# Patient Record
Sex: Female | Born: 1935 | Race: Black or African American | Hispanic: No | Smoking: Former smoker
Health system: Southern US, Community
[De-identification: ages and names within clinical notes are randomized; demographics above are authoritative.]

## PROBLEM LIST (undated history)

## (undated) DIAGNOSIS — Z9981 Dependence on supplemental oxygen: Secondary | ICD-10-CM

## (undated) DIAGNOSIS — Z5189 Encounter for other specified aftercare: Secondary | ICD-10-CM

## (undated) DIAGNOSIS — K219 Gastro-esophageal reflux disease without esophagitis: Secondary | ICD-10-CM

## (undated) DIAGNOSIS — F419 Anxiety disorder, unspecified: Secondary | ICD-10-CM

## (undated) DIAGNOSIS — G2581 Restless legs syndrome: Secondary | ICD-10-CM

## (undated) DIAGNOSIS — J9611 Chronic respiratory failure with hypoxia: Secondary | ICD-10-CM

## (undated) DIAGNOSIS — I509 Heart failure, unspecified: Secondary | ICD-10-CM

## (undated) DIAGNOSIS — I4891 Unspecified atrial fibrillation: Secondary | ICD-10-CM

## (undated) DIAGNOSIS — J40 Bronchitis, not specified as acute or chronic: Secondary | ICD-10-CM

## (undated) DIAGNOSIS — A419 Sepsis, unspecified organism: Secondary | ICD-10-CM

## (undated) DIAGNOSIS — K922 Gastrointestinal hemorrhage, unspecified: Secondary | ICD-10-CM

## (undated) DIAGNOSIS — D649 Anemia, unspecified: Secondary | ICD-10-CM

## (undated) DIAGNOSIS — I639 Cerebral infarction, unspecified: Secondary | ICD-10-CM

## (undated) DIAGNOSIS — L02416 Cutaneous abscess of left lower limb: Secondary | ICD-10-CM

## (undated) DIAGNOSIS — J181 Lobar pneumonia, unspecified organism: Secondary | ICD-10-CM

## (undated) DIAGNOSIS — M1990 Unspecified osteoarthritis, unspecified site: Secondary | ICD-10-CM

## (undated) DIAGNOSIS — R410 Disorientation, unspecified: Secondary | ICD-10-CM

## (undated) DIAGNOSIS — N179 Acute kidney failure, unspecified: Secondary | ICD-10-CM

## (undated) DIAGNOSIS — J9612 Chronic respiratory failure with hypercapnia: Secondary | ICD-10-CM

## (undated) DIAGNOSIS — I10 Essential (primary) hypertension: Secondary | ICD-10-CM

## (undated) DIAGNOSIS — L03116 Cellulitis of left lower limb: Secondary | ICD-10-CM

## (undated) DIAGNOSIS — R569 Unspecified convulsions: Secondary | ICD-10-CM

## (undated) HISTORY — PX: TUBAL LIGATION: SHX77

## (undated) HISTORY — PX: CATARACT EXTRACTION W/ INTRAOCULAR LENS IMPLANT: SHX1309

---
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
## 1998-05-10 ENCOUNTER — Other Ambulatory Visit: Admission: RE | Admit: 1998-05-10 | Discharge: 1998-05-10 | Payer: Self-pay | Admitting: Family Medicine

## 1998-06-28 ENCOUNTER — Emergency Department (HOSPITAL_COMMUNITY): Admission: EM | Admit: 1998-06-28 | Discharge: 1998-06-28 | Payer: Self-pay

## 1998-09-23 ENCOUNTER — Observation Stay (HOSPITAL_COMMUNITY): Admission: EM | Admit: 1998-09-23 | Discharge: 1998-09-24 | Payer: Self-pay | Admitting: Emergency Medicine

## 1998-09-23 ENCOUNTER — Encounter: Payer: Self-pay | Admitting: Neurology

## 1999-04-17 ENCOUNTER — Ambulatory Visit (HOSPITAL_COMMUNITY): Admission: RE | Admit: 1999-04-17 | Discharge: 1999-04-17 | Payer: Self-pay | Admitting: Family Medicine

## 1999-12-03 ENCOUNTER — Encounter (INDEPENDENT_AMBULATORY_CARE_PROVIDER_SITE_OTHER): Payer: Self-pay | Admitting: Specialist

## 1999-12-03 ENCOUNTER — Ambulatory Visit (HOSPITAL_COMMUNITY): Admission: RE | Admit: 1999-12-03 | Discharge: 1999-12-03 | Payer: Self-pay | Admitting: Gastroenterology

## 1999-12-17 ENCOUNTER — Ambulatory Visit (HOSPITAL_COMMUNITY): Admission: RE | Admit: 1999-12-17 | Discharge: 1999-12-17 | Payer: Self-pay | Admitting: Gastroenterology

## 1999-12-17 ENCOUNTER — Encounter: Payer: Self-pay | Admitting: Gastroenterology

## 1999-12-22 ENCOUNTER — Ambulatory Visit (HOSPITAL_BASED_OUTPATIENT_CLINIC_OR_DEPARTMENT_OTHER): Admission: RE | Admit: 1999-12-22 | Discharge: 1999-12-22 | Payer: Self-pay | Admitting: Pulmonary Disease

## 2002-01-17 ENCOUNTER — Emergency Department (HOSPITAL_COMMUNITY): Admission: EM | Admit: 2002-01-17 | Discharge: 2002-01-17 | Payer: Self-pay | Admitting: Emergency Medicine

## 2002-01-17 ENCOUNTER — Encounter: Payer: Self-pay | Admitting: Emergency Medicine

## 2004-03-29 ENCOUNTER — Emergency Department (HOSPITAL_COMMUNITY): Admission: EM | Admit: 2004-03-29 | Discharge: 2004-03-29 | Payer: Self-pay | Admitting: Emergency Medicine

## 2004-06-02 ENCOUNTER — Emergency Department (HOSPITAL_COMMUNITY): Admission: EM | Admit: 2004-06-02 | Discharge: 2004-06-02 | Payer: Self-pay | Admitting: Family Medicine

## 2005-09-16 ENCOUNTER — Encounter: Admission: RE | Admit: 2005-09-16 | Discharge: 2005-09-16 | Payer: Self-pay | Admitting: Orthopedic Surgery

## 2005-10-06 ENCOUNTER — Encounter: Payer: Self-pay | Admitting: Vascular Surgery

## 2005-10-06 ENCOUNTER — Ambulatory Visit (HOSPITAL_COMMUNITY): Admission: RE | Admit: 2005-10-06 | Discharge: 2005-10-06 | Payer: Self-pay | Admitting: Orthopedic Surgery

## 2006-02-11 ENCOUNTER — Encounter: Admission: RE | Admit: 2006-02-11 | Discharge: 2006-02-11 | Payer: Self-pay | Admitting: Orthopedic Surgery

## 2006-05-21 ENCOUNTER — Ambulatory Visit: Admission: RE | Admit: 2006-05-21 | Discharge: 2006-05-21 | Payer: Self-pay | Admitting: Orthopedic Surgery

## 2006-05-21 ENCOUNTER — Encounter: Payer: Self-pay | Admitting: Vascular Surgery

## 2006-06-21 ENCOUNTER — Emergency Department (HOSPITAL_COMMUNITY): Admission: EM | Admit: 2006-06-21 | Discharge: 2006-06-21 | Payer: Self-pay | Admitting: Emergency Medicine

## 2006-07-14 ENCOUNTER — Ambulatory Visit (HOSPITAL_COMMUNITY): Admission: RE | Admit: 2006-07-14 | Discharge: 2006-07-14 | Payer: Self-pay | Admitting: Internal Medicine

## 2006-08-25 ENCOUNTER — Encounter: Admission: RE | Admit: 2006-08-25 | Discharge: 2006-08-25 | Payer: Self-pay | Admitting: Orthopedic Surgery

## 2007-02-17 ENCOUNTER — Emergency Department (HOSPITAL_COMMUNITY): Admission: EM | Admit: 2007-02-17 | Discharge: 2007-02-17 | Payer: Self-pay | Admitting: Emergency Medicine

## 2007-05-23 ENCOUNTER — Emergency Department (HOSPITAL_COMMUNITY): Admission: EM | Admit: 2007-05-23 | Discharge: 2007-05-23 | Payer: Self-pay | Admitting: Family Medicine

## 2008-09-05 ENCOUNTER — Emergency Department (HOSPITAL_COMMUNITY): Admission: EM | Admit: 2008-09-05 | Discharge: 2008-09-05 | Payer: Self-pay | Admitting: Emergency Medicine

## 2008-09-30 ENCOUNTER — Emergency Department (HOSPITAL_COMMUNITY): Admission: EM | Admit: 2008-09-30 | Discharge: 2008-09-30 | Payer: Self-pay | Admitting: Family Medicine

## 2010-05-25 ENCOUNTER — Encounter: Payer: Self-pay | Admitting: Internal Medicine

## 2010-07-03 ENCOUNTER — Other Ambulatory Visit: Payer: Self-pay | Admitting: Gastroenterology

## 2010-07-03 ENCOUNTER — Encounter (HOSPITAL_COMMUNITY): Payer: Medicare Other | Attending: Gastroenterology

## 2010-07-03 DIAGNOSIS — R634 Abnormal weight loss: Secondary | ICD-10-CM

## 2010-07-03 DIAGNOSIS — D509 Iron deficiency anemia, unspecified: Secondary | ICD-10-CM | POA: Insufficient documentation

## 2010-07-04 ENCOUNTER — Encounter (HOSPITAL_COMMUNITY): Payer: Medicare Other

## 2010-07-05 LAB — CROSSMATCH
ABO/RH(D): A POS
Antibody Screen: NEGATIVE
Unit division: 0

## 2010-07-07 ENCOUNTER — Ambulatory Visit
Admission: RE | Admit: 2010-07-07 | Discharge: 2010-07-07 | Disposition: A | Discharge status: Home / Self Care | Payer: Medicare Other | Source: Ambulatory Visit | Attending: Gastroenterology | Admitting: Gastroenterology

## 2010-07-07 DIAGNOSIS — R634 Abnormal weight loss: Secondary | ICD-10-CM

## 2010-07-07 MED ORDER — IOHEXOL 300 MG/ML  SOLN
125.0000 mL | Freq: Once | INTRAMUSCULAR | Status: AC | PRN
  Administered 2010-07-07: 125 mL via INTRAVENOUS

## 2010-09-18 ENCOUNTER — Emergency Department (HOSPITAL_COMMUNITY): Payer: Medicare Other

## 2010-09-18 ENCOUNTER — Inpatient Hospital Stay (HOSPITAL_COMMUNITY)
Admission: EM | Admit: 2010-09-18 | Discharge: 2010-09-23 | DRG: 378 | Disposition: A | Discharge status: Home / Self Care | Payer: Medicare Other | Attending: Internal Medicine | Admitting: Internal Medicine

## 2010-09-18 DIAGNOSIS — K5730 Diverticulosis of large intestine without perforation or abscess without bleeding: Secondary | ICD-10-CM | POA: Diagnosis present

## 2010-09-18 DIAGNOSIS — Z860100 Personal history of colon polyps, unspecified: Secondary | ICD-10-CM

## 2010-09-18 DIAGNOSIS — K219 Gastro-esophageal reflux disease without esophagitis: Secondary | ICD-10-CM | POA: Diagnosis present

## 2010-09-18 DIAGNOSIS — K922 Gastrointestinal hemorrhage, unspecified: Principal | ICD-10-CM | POA: Diagnosis present

## 2010-09-18 DIAGNOSIS — D509 Iron deficiency anemia, unspecified: Secondary | ICD-10-CM | POA: Diagnosis present

## 2010-09-18 DIAGNOSIS — Z880 Allergy status to penicillin: Secondary | ICD-10-CM

## 2010-09-18 DIAGNOSIS — E119 Type 2 diabetes mellitus without complications: Secondary | ICD-10-CM | POA: Diagnosis present

## 2010-09-18 DIAGNOSIS — G2581 Restless legs syndrome: Secondary | ICD-10-CM | POA: Diagnosis present

## 2010-09-18 DIAGNOSIS — K319 Disease of stomach and duodenum, unspecified: Secondary | ICD-10-CM | POA: Diagnosis present

## 2010-09-18 DIAGNOSIS — R55 Syncope and collapse: Secondary | ICD-10-CM | POA: Diagnosis present

## 2010-09-18 DIAGNOSIS — I519 Heart disease, unspecified: Secondary | ICD-10-CM | POA: Diagnosis present

## 2010-09-18 DIAGNOSIS — Z7902 Long term (current) use of antithrombotics/antiplatelets: Secondary | ICD-10-CM

## 2010-09-18 DIAGNOSIS — I10 Essential (primary) hypertension: Secondary | ICD-10-CM | POA: Diagnosis present

## 2010-09-18 DIAGNOSIS — Z7982 Long term (current) use of aspirin: Secondary | ICD-10-CM

## 2010-09-18 DIAGNOSIS — D62 Acute posthemorrhagic anemia: Secondary | ICD-10-CM | POA: Diagnosis present

## 2010-09-18 DIAGNOSIS — Z79899 Other long term (current) drug therapy: Secondary | ICD-10-CM

## 2010-09-18 DIAGNOSIS — K5900 Constipation, unspecified: Secondary | ICD-10-CM | POA: Diagnosis present

## 2010-09-18 DIAGNOSIS — Z8673 Personal history of transient ischemic attack (TIA), and cerebral infarction without residual deficits: Secondary | ICD-10-CM

## 2010-09-18 DIAGNOSIS — I059 Rheumatic mitral valve disease, unspecified: Secondary | ICD-10-CM | POA: Diagnosis present

## 2010-09-18 LAB — LIPID PANEL
Cholesterol: 121 mg/dL (ref 0–200)
Total CHOL/HDL Ratio: 2.4 RATIO
VLDL: 15 mg/dL (ref 0–40)

## 2010-09-18 LAB — CARDIAC PANEL(CRET KIN+CKTOT+MB+TROPI)
CK, MB: 1.3 ng/mL (ref 0.3–4.0)
Relative Index: INVALID (ref 0.0–2.5)
Troponin I: <0.3 ng/mL (ref <0.30)

## 2010-09-18 LAB — POCT CARDIAC MARKERS
CKMB, poc: <1 ng/mL — ABNORMAL LOW (ref 1.0–8.0)
Myoglobin, poc: 61 ng/mL (ref 12–200)
Troponin i, poc: <0.05 ng/mL (ref 0.00–0.09)

## 2010-09-18 LAB — URINALYSIS, ROUTINE W REFLEX MICROSCOPIC
Bilirubin Urine: NEGATIVE
Nitrite: NEGATIVE
pH: 5.5 (ref 5.0–8.0)

## 2010-09-18 LAB — HEMOGLOBIN AND HEMATOCRIT, BLOOD: HCT: 26 % — ABNORMAL LOW (ref 36.0–46.0)

## 2010-09-18 LAB — COMPREHENSIVE METABOLIC PANEL
ALT: 8 U/L (ref 0–35)
AST: 13 U/L (ref 0–37)
CO2: 26 mEq/L (ref 19–32)
Calcium: 9.2 mg/dL (ref 8.4–10.5)
Creatinine, Ser: 1.1 mg/dL (ref 0.4–1.2)
GFR calc non Af Amer: 48 mL/min — ABNORMAL LOW (ref >60)
Potassium: 4.3 mEq/L (ref 3.5–5.1)
Total Bilirubin: 0.4 mg/dL (ref 0.3–1.2)

## 2010-09-18 LAB — DIFFERENTIAL
Basophils Absolute: 0 10*3/uL (ref 0.0–0.1)
Basophils Relative: 0 % (ref 0–1)
Eosinophils Relative: 1 % (ref 0–5)
Lymphs Abs: 1 10*3/uL (ref 0.7–4.0)
Monocytes Absolute: 0.3 10*3/uL (ref 0.1–1.0)
Monocytes Relative: 9 % (ref 3–12)
Neutro Abs: 2 10*3/uL (ref 1.7–7.7)

## 2010-09-18 LAB — PRO B NATRIURETIC PEPTIDE: Pro B Natriuretic peptide (BNP): 216.5 pg/mL (ref 0–450)

## 2010-09-18 LAB — OCCULT BLOOD, POC DEVICE: Fecal Occult Bld: POSITIVE

## 2010-09-18 LAB — CBC
Hemoglobin: 8.5 g/dL — ABNORMAL LOW (ref 12.0–15.0)
MCH: 24.4 pg — ABNORMAL LOW (ref 26.0–34.0)
MCHC: 30.4 g/dL (ref 30.0–36.0)
Platelets: 179 10*3/uL (ref 150–400)
WBC: 3.3 10*3/uL — ABNORMAL LOW (ref 4.0–10.5)

## 2010-09-18 MED ORDER — IOHEXOL 300 MG/ML  SOLN
100.0000 mL | Freq: Once | INTRAMUSCULAR | Status: AC | PRN
  Administered 2010-09-18: 100 mL via INTRAVENOUS

## 2010-09-19 DIAGNOSIS — R55 Syncope and collapse: Secondary | ICD-10-CM

## 2010-09-19 DIAGNOSIS — I059 Rheumatic mitral valve disease, unspecified: Secondary | ICD-10-CM

## 2010-09-19 LAB — HEMOGLOBIN A1C
Hgb A1c MFr Bld: 6.5 % — ABNORMAL HIGH (ref <5.7)
Mean Plasma Glucose: 140 mg/dL — ABNORMAL HIGH (ref <117)

## 2010-09-19 LAB — BASIC METABOLIC PANEL
BUN: 17 mg/dL (ref 6–23)
Creatinine, Ser: 0.95 mg/dL (ref 0.4–1.2)
GFR calc non Af Amer: 56 mL/min — ABNORMAL LOW (ref >60)

## 2010-09-19 LAB — GLUCOSE, CAPILLARY
Glucose-Capillary: 155 mg/dL — ABNORMAL HIGH (ref 70–99)
Glucose-Capillary: 142 mg/dL — ABNORMAL HIGH (ref 70–99)
Glucose-Capillary: 130 mg/dL — ABNORMAL HIGH (ref 70–99)

## 2010-09-19 LAB — IRON AND TIBC
Iron: 24 ug/dL — ABNORMAL LOW (ref 42–135)
TIBC: 344 ug/dL (ref 250–470)
UIBC: 320 ug/dL

## 2010-09-19 LAB — HEMOGLOBIN AND HEMATOCRIT, BLOOD: HCT: 34.5 % — ABNORMAL LOW (ref 36.0–46.0)

## 2010-09-19 LAB — CBC
Hemoglobin: 7.8 g/dL — ABNORMAL LOW (ref 12.0–15.0)
MCHC: 30.6 g/dL (ref 30.0–36.0)
RDW: 22.9 % — ABNORMAL HIGH (ref 11.5–15.5)

## 2010-09-19 LAB — URINE CULTURE
Colony Count: NO GROWTH
Culture  Setup Time: 201205180058

## 2010-09-19 LAB — CARDIAC PANEL(CRET KIN+CKTOT+MB+TROPI)
Troponin I: <0.3 ng/mL (ref <0.30)
Troponin I: <0.3 ng/mL (ref <0.30)

## 2010-09-19 LAB — PREPARE RBC (CROSSMATCH)

## 2010-09-19 NOTE — H&P (Signed)
NAMEEMMALI, KAROW              ACCOUNT NO.:  [id number]  MEDICAL RECORD NO.:  [id number]           PATIENT TYPE:  E  LOCATION:  WLED                         FACILITY:  [facility]  PHYSICIAN:  Thad Ranger, MD       DATE OF BIRTH:  Oct 11, 1926  DATE OF ADMISSION:  09/18/2010 DATE OF DISCHARGE:                             HISTORY & PHYSICAL   PRIMARY CARE PHYSICIAN:  Fleet Contras, MD  GASTROENTEROLOGIST:  Charna Elizabeth, MD  CHIEF COMPLAINT:  Syncopal episode.  HISTORY OF PRESENT ILLNESS:  Ms. Rankin is an 77 year old female with a history of anemia, hypertension, diabetes, history of CVA, GERD, who presented to the [facility] emergency room after a syncopal episode. History was provided by the patient herself and the family members present in the room.  The patient was at the [employer] today and she was sitting in the chair engaged in the activities of the [employer] singing devotional sounds.  Per the patient, they had no strenuous activity at the [employer].  Around 10:30, the patient felt dyspneic, dizzy, lightheaded and had a syncopal episode while sitting in the chair.  The patient states that she did not hit her head on the floor. She was still sitting on the chair when she woke up.  She endorsed having nausea, however, no vomiting.  No chest pain or any palpitations. She felt that she was breathing somewhat hard, otherwise no chest pain or palpitations.  She also reported having dark stools today.  She had one episode of dark stools in the morning at 7:30 a.m. and another when she was being transported by the ambulance.  She states that her appetite has been okay.  She is also on aspirin and Plavix for the history of CVA.  Of note, the patient also reported that she had a colonoscopy done in February 2012 by Dr. Charna Elizabeth and it had shown polyps and questionable something and the biopsy was negative.  FOBT in the emergency room was also positive for occult blood.   She denied any fever or chills, or any loss of appetite, or abdominal pain, or any diarrhea or constipation.  REVIEW OF SYSTEMS:  Pertinent positives are dictated above, otherwise negative.  PAST MEDICAL HISTORY: 1. Hypertension. 2. History of arthritis. 3. Diabetes mellitus type 2, not on insulin. 4. GERD. 5. Obesity. 6. Restless legs syndrome. 7. History of CVA, on aspirin and Plavix with slight left-sided     residual deficits.  SOCIAL HISTORY:  The patient denies any smoking, alcohol or any drug use.  She currently lives at home with her daughter.  ALLERGIES:  PENICILLIN and ERYTHROMYCIN.  MEDICATIONS PRIOR TO ADMISSION: 1. Coreg CR 20 mg p.o. daily. 2. Colace 100 mg p.o. daily. 3. Aspirin 81 mg daily. 4. Calcium and vitamin D 1 tablet p.o. b.i.d. 5. Ferrous sulfate 325 mg p.o. t.i.d. 6. Plavix 75 mg p.o. daily. 7. Nexium 40 mg p.o. daily. 8. Micardis/hydrochlorothiazide 80/12.5 mg p.o. daily. 9. Metformin XR 750 mg p.o. b.i.d. 10.Lyrica 50 mg daily. 11.Caduet 10/10 mg p.o. q.a.m. 12.Actos 45 mg p.o. daily at bedtime. 13.Requip 2 mg  p.o. 4 times daily.  PHYSICAL EXAM:  VITAL SIGNS:  Blood pressure 156/71, pulse rate 88, respirations 20, temperature 97.4. GENERAL:  The patient is alert, awake and oriented, very pleasant and cooperative, not in acute distress. HEENT:  Anicteric sclerae conjunctivae.  Pupils react to light and accommodation.  EOMI. NECK:  Supple.  No lymphopathy, no JVD. CVS:  S1, S2 clear.  Regular rate and rhythm. CHEST:  Clear to auscultation bilaterally. ABDOMEN:  Soft, nontender, nondistended.  Normal bowel sounds. EXTREMITIES:  No cyanosis, clubbing or edema noted. NEUROLOGIC:  No new focal neurological deficits noted.  LAB DIAGNOSTIC DATA:  CBC showed white count of 3.3, hemoglobin 8.5, hematocrit 28.0, platelets 179, BNP 216.  D-dimer positive at 1.15. CMP:  Sodium 136, potassium 4.3, BUN 24, creatinine 1.1, calcium 9.2. UA negative for  any UTI.  Cardiac markers first set negative.  Fecal occult blood positive.  RADIOLOGICAL DATA:  Chest x-ray two-view:  Stable cardiomegaly, no active lung disease.  IMPRESSION AND PLAN:  Ms. Russum is an 77 year old female presenting with a syncopal episode. 1. Syncope:  The patient will be admitted to the Medicine service and     placed on telemonitored floor.  Possibly a vasovagal syncopal     episode or exacerbated due to anemia and GI bleed.  Rule out any     cardiac cause of syncope.  Will obtain 2-D echocardiogram and     carotid Dopplers for further workup.  The patient will be continued     on her cardiac medications except hydrochlorothiazide.  Obtain     three sets of cardiac enzymes. 2. Anemia with GI bleed:  Will also consult the patient's     gastroenterologist, Dr. Loreta Ave.  Prior colonoscopy report with biopsy     reports are not available to me.  We will defer to Gastroenterology     for repeat colonoscopy.  For now, I will place the patient on IV     Protonix as well as a clear liquid diet. 3. Hypertension:  Restart Coreg and ARBs.  Hydrochlorothiazide will be     placed on hold secondary to the syncopal episode and dehydration. 4. Diabetes mellitus 2:  Placed on sliding scale insulin and obtain     HbA1c. 5. History of gastroesophageal reflux disease.  Continue proton pump     inhibitor. 6. History of cerebrovascular accident:  Hold aspirin and Plavix for     now secondary to GI bleed and anemia. 7. Code status discussed in detail with the patient and she opted to     be full code.     Thad Ranger, MD     RR/MEDQ  D:  09/18/2010  T:  09/18/2010  Job:  [id number]  cc:   Fleet Contras, M.D. Fax: [phone]  Anselmo Rod, MD, [facility] Fax: [phone]  Electronically Signed by Jodilyn Giese  on 09/19/2010 01:55:15 PM

## 2010-09-19 NOTE — Procedures (Signed)
[place]  Patient:    ELLISHA BANKSON                      MRN: [id number] Proc. Date: 12/03/99 Adm. Date:  [id number] Attending:  Charna Elizabeth CC:         Geraldo Pitter, M.D.                           Procedure Report  DATE OF BIRTH:  1926-05-08  REFERRING PHYSICIAN:  Geraldo Pitter, M.D.  PROCEDURE PERFORMED:  Esophagogastroduodenoscopy with biopsies.  ENDOSCOPIST:  Anselmo Rod, M.D.  INSTRUMENT USED:  Olympus video panendoscope.  INDICATIONS FOR PROCEDURE:  Melenic stool in a 77 year old black female, rule out peptic ulcer disease, esophagitis, gastritis, etc.  PREPROCEDURE PREPARATION:  Informed consent was procured from the patient. The patient was fasted for eight hours prior to the procedure.  PREPROCEDURE PHYSICAL:  The patient had stable vital signs.  Neck supple. Chest clear to auscultation.  S1, S2 regular.  Abdomen soft with normal abdominal bowel sounds.  DESCRIPTION OF PROCEDURE:  The patient was placed in left lateral decubitus position and sedated with an additional 10 mg of Demerol and 1 mg of Versed intravenously.  Once the patient was adequately sedated and maintained on low-flow oxygen and continuous cardiac monitoring, the Olympus video panendoscope was advanced through the mouthpiece, over the tongue, into the esophagus under direct vision.  The entire esophagus appeared normal without evidence of ring, stricture, masses, lesions or esophagitis.  The scope was then advanced to the stomach.  The entire gastric mucosa appeared healthy except for mild edematous mucosa around the pylorus.  No hiatal hernia was seen.  The duodenal bulb and the small bowel distal to the bulb for 60 cm appeared normal.  There was no outlet obstructiuon.  The patient tolerated the procedure well without complications.  IMPRESSION:  Essentially normal esophagogastroduodenoscopy except for mild edema of the mucosa around the  pylorus.  RECOMMENDATION: 1. Serial CBCs. 2. Small bowel follow-through if her hemoglobin drops. 3. Outpatient follow-up in the next two weeks. DD:  12/03/99 TD:  12/04/99 Job: [id number]

## 2010-09-19 NOTE — Procedures (Signed)
[place]  Patient:    Elizabeth Pugh                      MRN: [id number] Adm. Date:  [id number] Attending:  Charna Elizabeth CC:         Geraldo Pitter, M.D.   Procedure Report  DATE OF BIRTH:  [birth year].  REFERRING PHYSICIAN:  Geraldo Pitter, M.D.  PROCEDURE PERFORMED:  Colonoscopy with snare polypectomy x 1.  ENDOSCOPIST:  Anselmo Rod, M.D.  INSTRUMENT USED:  Olympus video colonoscope.  INDICATIONS FOR PROCEDURE:  Melenic stool in a 77 year old black female.  Rule out colonic masses, hemorrhoids, etc.  PREPROCEDURE PREPARATION:  Informed consent was procured from the patient. The patient was fasted for eight hours prior to the procedure and prepped with a bottle of magnesium citrate and a gallon of NuLytely the night prior to the procedure.  PREPROCEDURE PHYSICAL:  The patient had stable vital signs.  Neck supple. Chest clear to auscultation.  S1, S2 regular.  Abdomen soft with normal abdominal bowel sounds.  DESCRIPTION OF PROCEDURE:  The patient was placed in the left lateral decubitus position and sedated with 20 mg of Demerol and 2 mg of Versed intravenously.  Once the patient was adequately sedated and maintained on low-flow oxygen and continuous cardiac monitoring, the Olympus video colonoscope was advanced from the rectum to the cecum without difficulty. There was some residual stool in the colon especially on the right side, but visualization was adequate.  The appendicular orifice and the ileocecal valve were clearly visualized and photographed.  There were a few early scattered diverticula (very small) throughout the colon.  There was a small sessile polyp that was snared from 20 cm.  This seemed to be a lipoma.  There were small external hemorrhoids that were nonbleeding at the time of examination. The patient tolerated the procedure well without complications.  No large masses or polyps were  seen.  IMPRESSION: 1. Small nonbleeding internal and external hemorrhoids. 2. Few early scattered diverticula throughout the colon. 3. One small sessile polyp snared at 20 cm question lipoma.  RECOMMENDATIONS: 1. Await pathology results. 2. Esophagogastroduodenoscopy to further complete her work-up and for    melena. 3. Outpatient follow-up in the next two weeks.DD:  12/03/99 TD:  12/04/99 Job: [id number]

## 2010-09-20 LAB — TYPE AND SCREEN

## 2010-09-20 LAB — GLUCOSE, CAPILLARY
Glucose-Capillary: 161 mg/dL — ABNORMAL HIGH (ref 70–99)
Glucose-Capillary: 148 mg/dL — ABNORMAL HIGH (ref 70–99)

## 2010-09-20 LAB — BASIC METABOLIC PANEL
BUN: 9 mg/dL (ref 6–23)
CO2: 30 mEq/L (ref 19–32)
Calcium: 10 mg/dL (ref 8.4–10.5)
Creatinine, Ser: 0.79 mg/dL (ref 0.4–1.2)
GFR calc Af Amer: >60 mL/min (ref >60)

## 2010-09-20 LAB — CBC
Hemoglobin: 11 g/dL — ABNORMAL LOW (ref 12.0–15.0)
MCH: 26 pg (ref 26.0–34.0)
MCHC: 31.9 g/dL (ref 30.0–36.0)
MCV: 81.6 fL (ref 78.0–100.0)

## 2010-09-21 LAB — CBC
HCT: 33.9 % — ABNORMAL LOW (ref 36.0–46.0)
MCHC: 30.7 g/dL (ref 30.0–36.0)
MCV: 82.1 fL (ref 78.0–100.0)
RDW: 21.5 % — ABNORMAL HIGH (ref 11.5–15.5)
WBC: 2.5 10*3/uL — ABNORMAL LOW (ref 4.0–10.5)

## 2010-09-21 LAB — DIFFERENTIAL
Basophils Relative: 0 % (ref 0–1)
Eosinophils Relative: 2 % (ref 0–5)
Lymphs Abs: 1 10*3/uL (ref 0.7–4.0)
Monocytes Absolute: 0.4 10*3/uL (ref 0.1–1.0)

## 2010-09-21 LAB — GLUCOSE, CAPILLARY: Glucose-Capillary: 125 mg/dL — ABNORMAL HIGH (ref 70–99)

## 2010-09-22 ENCOUNTER — Inpatient Hospital Stay (HOSPITAL_COMMUNITY): Payer: Medicare Other

## 2010-09-22 ENCOUNTER — Other Ambulatory Visit: Payer: Self-pay | Admitting: Gastroenterology

## 2010-09-22 LAB — GLUCOSE, CAPILLARY
Glucose-Capillary: 154 mg/dL — ABNORMAL HIGH (ref 70–99)
Glucose-Capillary: 175 mg/dL — ABNORMAL HIGH (ref 70–99)
Glucose-Capillary: 117 mg/dL — ABNORMAL HIGH (ref 70–99)
Glucose-Capillary: 89 mg/dL (ref 70–99)

## 2010-09-22 LAB — CBC
Hemoglobin: 10.6 g/dL — ABNORMAL LOW (ref 12.0–15.0)
MCH: 25.7 pg — ABNORMAL LOW (ref 26.0–34.0)
RBC: 4.13 MIL/uL (ref 3.87–5.11)

## 2010-09-22 MED ORDER — IOHEXOL 300 MG/ML  SOLN
100.0000 mL | Freq: Once | INTRAMUSCULAR | Status: AC | PRN
  Administered 2010-09-22: 100 mL via INTRAVENOUS

## 2010-09-22 NOTE — Consult Note (Signed)
Elizabeth Pugh, Elizabeth Pugh              ACCOUNT NO.:  [id number]  MEDICAL RECORD NO.:  [id number]           PATIENT TYPE:  I  LOCATION:  1505                         FACILITY:  [facility]  PHYSICIAN:  Anselmo Rod, MD, FACGDATE OF BIRTH:  Sep 17, 1926  DATE OF CONSULTATION:  09/19/2010 DATE OF DISCHARGE:                                CONSULTATION   REASON FOR CONSULTATION:  Black stools with iron deficiency anemia and a syncopal episode in an 77 year old black female.  ASSESSMENT: 1. Iron deficiency anemia with melena and a hemoglobin of 7.8 g/dL     this morning, rule out peptic ulcer disease, esophagitis,     gastritis, Dieulafoy lesion, etc.  Last EGD done in 2001 was     normal. 2. Sigmoid diverticulosis. 3. Chronic constipation, worsened with iron supplementation. 4. Gastroesophageal reflux disease, on PPIs. 5. Chronic polyp (inflammatory polyp removed and a colonoscopy done     earlier this year). 6. Adult-onset diabetes mellitus, on metformin and Actos. 7. Hypertension, on Coreg, Caduet, and Micardis/HCTZ. 8. History of stroke, on aspirin and Plavix. 9. Restless legs syndrome, on ReQuip and Lyrica. 10.Arthritis. 11.Morbid obesity. 12.Status post BTL in the remote past.  RECOMMENDATIONS: 1. Transfuse another 2 units of packed red blood cells today and     monitor serial CBCs. 2. Cardiac workup as planned by Dr. Isidoro Donning. 3. EGD on Monday. 4. Avoid all nonsteriodals if possible. 5. No need for colonoscopy at this time.  DISCUSSION:  Ms. Elizabeth Pugh is a very pleasant 77 year old black female with multiple medical problems as stated above, who had a colonoscopy done by me earlier this year for iron deficiency anemia, was found to have an inflammatory polyp with sigmoid diverticulosis.  The patient claims she was in her usual state of health and singing devotional songs at her group home when she felt weak and dizzy and had a bowel movement without realizing she was  incontinent.  According to her, the stools were dark in color with no evidence of frank hematochezia.  At that time, she was transported to [facility] Emergency Room where she was seen and evaluated and admitted for observation and further treatment.  The patient denies any abdominal pain, nausea, vomiting, fever, chills, or rigors.  Her appetite is fairly good.  Her weight has been stable.  She denies any OTC nonsteriodal use except for the aspirin, she has been taking along with Plavix.  She has problems with chronic constipation, worsened with iron. She can go 2-3 days without a bowel movement.  She has reflux, which is well controlled on Nexium.  There is no history of ulcers, jaundice, or colitis.  PAST MEDICAL HISTORY:  See list above.  ALLERGIES: 1. PENICILLINS. 2. ERYTHROMYCIN.  MEDICATIONS AT HOME: 1. Coreg. 2. Colace. 3. Aspirin. 4. Calcium. 5. Vitamin supplementation. 6. Ferrous sulphate 325 mg t.i.d. 7. Plavix. 8. Nexium. 9. Micardis/HCTZ. 10.Metformin. 11.Lyrica. 12.Caduet. 13.Actos. 14.ReQuip.  MEDICATIONS IN THE HOSPITAL:  Norvasc, Lipitor, Coreg, NovoLog insulin by sliding scale coverage, pantoprazole, Lyrica, ReQuip, Zofran, and Phenergan p.r.n.  SOCIAL HISTORY:  She denies use of alcohol, tobacco, or drugs.  She lives  at home with her daughter.  FAMILY HISTORY:  Noncontributory.  GENERAL PHYSICAL EXAMINATION:  GENERAL:  An elderly black female, in no acute distress. VITAL SIGNS:  Temperature 98.2, blood pressure 136/75, pulse 68 per minute, and respiratory rate 18. HEENT:  Atraumatic, normocephalic head.  Facial symmetry preserved. Dentures both in the upper and lower jaw. NECK:  Supple. CHEST:  Clear to auscultation. HEART:  S1 and S2, regular. ABDOMEN:  Morbidly obese, nontender with normal bowel sounds.  No hepatosplenomegaly appreciated.  There is a scar in the suprapubic area from remote BTL. RECTAL:  Deferred, but the patient was found  to be guaiac positive on an evaluation in the emergency room.  Laboratory evaluation on admission revealed normal CMET except for glucose of 188 and BUN of 24.  D-dimer was high at 1.15.  BNP was 216. Hemoglobin was 8.5 with a white count of 3.3 K and platelets of 179,000. Lipid profile was within normal limits with a cholesterol of 121 and triglycerides of 74.  Hemoglobin A1c was high at 6.5.  BMET this morning revealed glucose of 102 and was otherwise normal.  Anemia panel revealed iron of 24, ferritin of 15, and TIBC of 344.  Vitamin B12 was 245 and folate was 7.9.  Cardiac panel was within normal limits.  Hemoglobin today was 7.8 g/dL with a platelet count of 162,000.  PLAN:  Transfuse the patient with another 2 units and do serial CBCs, and EGD will be done on Monday or earlier if needed, once the cardiac workup has been completed.     Anselmo Rod, MD, [facility]     JNM/MEDQ  D:  09/19/2010  T:  09/19/2010  Job:  [id number]  cc:   Fleet Contras, M.D. Fax: [phone]  Electronically Signed by Charna Elizabeth M.D. on 09/22/2010 04:48:37 PM

## 2010-09-23 LAB — GLUCOSE, CAPILLARY
Glucose-Capillary: 159 mg/dL — ABNORMAL HIGH (ref 70–99)
Glucose-Capillary: 154 mg/dL — ABNORMAL HIGH (ref 70–99)

## 2010-09-23 LAB — CBC
Platelets: 160 10*3/uL (ref 150–400)
RBC: 4.28 MIL/uL (ref 3.87–5.11)
WBC: 2.9 10*3/uL — ABNORMAL LOW (ref 4.0–10.5)

## 2010-09-24 LAB — GLUCOSE, CAPILLARY: Glucose-Capillary: 145 mg/dL — ABNORMAL HIGH (ref 70–99)

## 2010-10-01 NOTE — Discharge Summary (Addendum)
Elizabeth Pugh, Elizabeth Pugh              ACCOUNT NO.:  [id number]  MEDICAL RECORD NO.:  [id number]           PATIENT TYPE:  I  LOCATION:  1505                         FACILITY:  [facility]  PHYSICIAN:  Marcellus Scott, MD     DATE OF BIRTH:  June 27, 1926  DATE OF ADMISSION:  09/18/2010 DATE OF DISCHARGE:  09/23/2010                        DISCHARGE SUMMARY - REFERRING   PRIMARY CARE PROVIDER:  Dr. Fleet Contras.  GASTROENTEROLOGIST:  Dr. Charna Elizabeth.  DISCHARGE DIAGNOSES: 1. Upper gastrointestinal bleed. 2. Submucosal antral mass on EGD.  Pathology pending. 3. Acute blood loss anemia on chronic iron deficiency anemia. 4. Syncope versus presyncope secondary to problem #3. 5. Diabetes mellitus type 2. 6. Hypertension. 7. History of cerebrovascular accident. 8. History of restless legs syndrome. 9. Gastroesophageal reflux disease.  DISCHARGE MEDICATIONS: 1. Actos 40 mg p.o. daily at bedtime. 2. Aspirin enteric coated 81 mg p.o. daily at bedtime. 3. Caduet 10/10 mg 1 tablet p.o. daily. 4. Calcium/vitamin D 1 tablet p.o. b.i.d. 5. Colace 100 mg p.o. daily. 6. Coreg CR 20 mg p.o. daily. 7. Ferrous sulfate 325 mg p.o. t.i.d. 8. Lyrica 50 mg p.o. daily. 9. Metformin XR 750 mg p.o. b.i.d. 10.Micardis HCT 80/12.5 mg p.o. daily. 11.Nexium 40 mg p.o. daily. 12.ReQuip 2 mg p.o. 4 times daily.  DIAGNOSTIC LABORATORY DATA:  Sodium 136, potassium 4.3, chloride 101, CO2 of 26, BUN 24, creatinine 1.10, glucose 188, D-dimer 1.15, and proBNP 216.5.  WBCs 3.3, hemoglobin 8.5, hematocrit 28.0, platelets 179, and RDW is 22.8.  Total CK 56, CK-MB 1.3, troponin I less than 0.30, cholesterol 121, triglycerides 74, HDL 51, LDL 55, VLDL 15, and hemoglobin A1c 6.5.  FOBT is positive.  Urinalysis was negative.  DIAGNOSTIC RADIOLOGY: 1. Chest x-ray, stable cardiomegaly.  No active lung disease. 2. CT angio of the chest yields no CT findings for pulmonary embolism.     Normal thoracic aorta.  No significant  pulmonary findings. 3. CT abdomen and pelvis done on May 22nd, shows a 3.2 cm lipoma in     the gastric antrum, 3.0 cm lipoma in the ascending colon.  No     evidence of bowel obstruction.  CONSULTATIONS:  Gastroenterology, Dr. Loreta Ave.  PROCEDURES:  EGD with multiple cold biopsies.  EGD yields normal esophagus and GEJ, patchy gastritis and cardia and antrum biopsy, submucosal antral mass.  Normal proximal small bowel biopsy, to rule out sprue.  BRIEF HISTORY:  Elizabeth Pugh is an 77 year old female with a history of anemia, hypertension, diabetes, history of CVA, and GERD, who presented to the [facility] Emergency Room after a reported syncopal episode. The patient indicated she was sitting in a chair when she began to feel dyspneic and dizzy, and had a syncopal episode while sitting in the chair.  There was no report of hitting her head.  There was no fall. She also reported nausea without vomiting.  She also indicated that she developed dark stools recently.  The patient is on aspirin and Plavix for history of CVA.  The patient also reported that she had a colonoscopy done, February 2012 by Dr. Loreta Ave.  It showed polyps and questionable something  and the biopsy was negative.  Workup in the emergency room yields an FOBT positive.  She was admitted for GI bleed.  HOSPITAL COURSE BY PROBLEMS: 1. Upper GI bleed.  The patient was admitted to the telemetry floor.     She was placed on IV Protonix and a clear liquid diet.  GI consult     was obtained and an EGD performed on May 21st.  Results noted     above. 2. Submucosal antral mass on EGD.  Pathology is pending.  The patient     will complete work up on an outpatient basis.  Dr. Kenna Gilbert office     will call the patient for an appointment. 3. Acute blood loss on chronic iron deficiency anemia.  The patient     had hemoglobin of 7.8 on admission.  She was transfused 2 units of     packed RBCs.  Hemoglobin on discharge is 11.1.  Hemoglobin  has     remained stable.  The patient also got IV iron. 4. Leukopenia probably secondary to iron deficiency anemia.  Recommend     a hematology consult on an outpatient basis. 5. Presyncope.  There was no fall.  A 2-D echo on May 18th, yielded an     ejection fraction of 55% to 60% with grade 2 diastolic dysfunction.     Mild regurgitation of mitral valve.  Left atrium mildly dilated.     Right atrium mildly dilated. 6. Diabetes mellitus.  Recently controlled during her hospitalization.     We will continue home medications. 7. History of hypertension recently been controlled during this     hospitalization.  We will continue home medications. 8. History of CVA.  Plavix held secondary to #1. 9. History of restless legs syndrome, stable during hospitalization.  PHYSICAL EXAMINATION:  VITAL SIGNS:  Temperature 98.7, blood pressure 137/72, heart rate 70, respirations 18, and saturations 93% on room air. GENERAL:  Awake and alert, sitting up in bed, no acute distress. CV:  Regular rate and rhythm.  No murmur, gallop, or rub. RESPIRATORY:  Normal effort.  Breath sounds are clear to auscultation bilaterally. ABDOMEN:  Round and soft, positive bowel sounds throughout, nontender to palpation. NEUROLOGICAL:  Alert and oriented x3.  Speech clear.  DISPOSITION:  The patient is medically ready to be discharged to home. 1. The patient will need to follow up with primary care provider, Dr.     Fleet Contras in 7 to 10 days.  The patient will need a CBC. 2. Dr. Kenna Gilbert office will call the patient to schedule an appointment     for completing the workup.  We will probably need endoscopic     ultrasound to complete the workup.  Continue her PPI.  ACTIVITY:  Up with assistance.  The patient uses a walker at home.  We will request home health PT.  DIET:  Heart healthy.  Time spent on discharge, 35 minutes.     Gwenyth Bender, NP   ______________________________ Marcellus Scott,  MD    KMB/MEDQ  D:  09/23/2010  T:  09/23/2010  Job:  [id number]  cc:   Fleet Contras, M.D. Fax: [phone]  Anselmo Rod, MD, [facility] Fax: [phone]  Electronically Signed by Toya Smothers  on 10/01/2010 04:15:02 PM MedRecNo: [id number] [facility], Account: [id number], DocSeq: [id number] FINAL DIAGNOSIS    1. Duodenum, biopsy, :   - BENIGN SMALL BOWEL MUCOSA.NO ACTIVE INFLAMMATION OR VILLOUS ATROPHY   IDENTIFIED.    2. Stomach, biopsy, antral :   -  BENIGN GASTRIC MUCOSA WITH FOCAL IRON PILL GASTRITIS AND   REACTIVE/REGENERATIVE EPITHELIAL CHANGES.NO INTESTINAL METAPLASIA OR   HELICOBACTER PYLORI ORGANISMS IDENTIFIED.  Electronically Signed by Marcellus Scott MD on 10/24/2010 09:54:39 PM

## 2010-11-21 ENCOUNTER — Other Ambulatory Visit (HOSPITAL_COMMUNITY): Payer: Self-pay | Admitting: Cardiology

## 2010-12-08 ENCOUNTER — Encounter (HOSPITAL_COMMUNITY)
Admission: RE | Admit: 2010-12-08 | Discharge: 2010-12-08 | Disposition: A | Discharge status: Home / Self Care | Payer: Medicare Other | Source: Ambulatory Visit | Attending: Cardiology | Admitting: Cardiology

## 2010-12-08 DIAGNOSIS — R079 Chest pain, unspecified: Secondary | ICD-10-CM | POA: Insufficient documentation

## 2010-12-08 DIAGNOSIS — R9439 Abnormal result of other cardiovascular function study: Secondary | ICD-10-CM | POA: Insufficient documentation

## 2010-12-08 DIAGNOSIS — Z8679 Personal history of other diseases of the circulatory system: Secondary | ICD-10-CM | POA: Insufficient documentation

## 2010-12-08 DIAGNOSIS — E119 Type 2 diabetes mellitus without complications: Secondary | ICD-10-CM | POA: Insufficient documentation

## 2010-12-08 DIAGNOSIS — I252 Old myocardial infarction: Secondary | ICD-10-CM | POA: Insufficient documentation

## 2010-12-08 DIAGNOSIS — R55 Syncope and collapse: Secondary | ICD-10-CM | POA: Insufficient documentation

## 2010-12-08 MED ORDER — TECHNETIUM TC 99M TETROFOSMIN IV KIT
10.0000 | PACK | Freq: Once | INTRAVENOUS | Status: AC | PRN
  Administered 2010-12-08: 10 via INTRAVENOUS

## 2010-12-08 MED ORDER — TECHNETIUM TC 99M TETROFOSMIN IV KIT
30.0000 | PACK | Freq: Once | INTRAVENOUS | Status: AC | PRN
  Administered 2010-12-08: 30 via INTRAVENOUS

## 2011-02-11 LAB — DIFFERENTIAL
Basophils Absolute: 0
Basophils Relative: 0
Eosinophils Absolute: 0
Eosinophils Relative: 0
Lymphocytes Relative: 23
Lymphs Abs: 0.9
Monocytes Absolute: 0.3
Monocytes Relative: 8
Neutro Abs: 2.6
Neutrophils Relative %: 68

## 2011-02-11 LAB — POCT CARDIAC MARKERS
CKMB, poc: <1 — ABNORMAL LOW
CKMB, poc: <1 — ABNORMAL LOW
Myoglobin, poc: 81.7
Myoglobin, poc: 86.6
Operator id: 4295
Operator id: 4295
Troponin i, poc: <0.05
Troponin i, poc: <0.05

## 2011-02-11 LAB — URINALYSIS, ROUTINE W REFLEX MICROSCOPIC
Bilirubin Urine: NEGATIVE
Glucose, UA: NEGATIVE
Ketones, ur: NEGATIVE
Nitrite: NEGATIVE
Protein, ur: NEGATIVE
Specific Gravity, Urine: 1.017
Urobilinogen, UA: 1
pH: 6.5

## 2011-02-11 LAB — URINE CULTURE: Colony Count: >=100000

## 2011-02-11 LAB — CBC
HCT: 31.5 — ABNORMAL LOW
Hemoglobin: 10.7 — ABNORMAL LOW
MCHC: 33.8
MCV: 90
Platelets: 182
RBC: 3.5 — ABNORMAL LOW
RDW: 15.3 — ABNORMAL HIGH
WBC: 3.8 — ABNORMAL LOW

## 2011-02-11 LAB — COMPREHENSIVE METABOLIC PANEL
AST: 15
CO2: 28
Calcium: 9.2
Creatinine, Ser: 0.94
GFR calc Af Amer: >60
GFR calc non Af Amer: 58 — ABNORMAL LOW

## 2011-02-11 LAB — COMPREHENSIVE METABOLIC PANEL WITH GFR
ALT: 11
Albumin: 3.3 — ABNORMAL LOW
Alkaline Phosphatase: 52
BUN: 28 — ABNORMAL HIGH
Chloride: 103
Glucose, Bld: 156 — ABNORMAL HIGH
Potassium: 4.1
Sodium: 137
Total Bilirubin: 1.3 — ABNORMAL HIGH
Total Protein: 5.4 — ABNORMAL LOW

## 2011-02-11 LAB — URINE MICROSCOPIC-ADD ON

## 2011-03-25 ENCOUNTER — Emergency Department (HOSPITAL_COMMUNITY): Payer: Medicare Other

## 2011-03-25 ENCOUNTER — Inpatient Hospital Stay (HOSPITAL_COMMUNITY)
Admission: EM | Admit: 2011-03-25 | Discharge: 2011-03-27 | DRG: 312 | Disposition: A | Discharge status: Home / Self Care | Payer: Medicare Other | Source: Ambulatory Visit | Attending: Internal Medicine | Admitting: Internal Medicine

## 2011-03-25 ENCOUNTER — Encounter (HOSPITAL_COMMUNITY): Payer: Self-pay | Admitting: *Deleted

## 2011-03-25 DIAGNOSIS — Z23 Encounter for immunization: Secondary | ICD-10-CM

## 2011-03-25 DIAGNOSIS — I69959 Hemiplegia and hemiparesis following unspecified cerebrovascular disease affecting unspecified side: Secondary | ICD-10-CM

## 2011-03-25 DIAGNOSIS — E1165 Type 2 diabetes mellitus with hyperglycemia: Secondary | ICD-10-CM | POA: Diagnosis present

## 2011-03-25 DIAGNOSIS — Z880 Allergy status to penicillin: Secondary | ICD-10-CM

## 2011-03-25 DIAGNOSIS — Z79899 Other long term (current) drug therapy: Secondary | ICD-10-CM

## 2011-03-25 DIAGNOSIS — G459 Transient cerebral ischemic attack, unspecified: Secondary | ICD-10-CM | POA: Diagnosis present

## 2011-03-25 DIAGNOSIS — G2581 Restless legs syndrome: Secondary | ICD-10-CM | POA: Diagnosis present

## 2011-03-25 DIAGNOSIS — R4182 Altered mental status, unspecified: Secondary | ICD-10-CM | POA: Diagnosis present

## 2011-03-25 DIAGNOSIS — K219 Gastro-esophageal reflux disease without esophagitis: Secondary | ICD-10-CM | POA: Diagnosis present

## 2011-03-25 DIAGNOSIS — E119 Type 2 diabetes mellitus without complications: Secondary | ICD-10-CM | POA: Diagnosis present

## 2011-03-25 DIAGNOSIS — D509 Iron deficiency anemia, unspecified: Secondary | ICD-10-CM | POA: Diagnosis present

## 2011-03-25 DIAGNOSIS — R55 Syncope and collapse: Principal | ICD-10-CM | POA: Diagnosis present

## 2011-03-25 DIAGNOSIS — I519 Heart disease, unspecified: Secondary | ICD-10-CM | POA: Diagnosis present

## 2011-03-25 DIAGNOSIS — Z7982 Long term (current) use of aspirin: Secondary | ICD-10-CM

## 2011-03-25 DIAGNOSIS — I69359 Hemiplegia and hemiparesis following cerebral infarction affecting unspecified side: Secondary | ICD-10-CM

## 2011-03-25 DIAGNOSIS — I10 Essential (primary) hypertension: Secondary | ICD-10-CM | POA: Diagnosis not present

## 2011-03-25 DIAGNOSIS — IMO0002 Reserved for concepts with insufficient information to code with codable children: Secondary | ICD-10-CM | POA: Diagnosis present

## 2011-03-25 HISTORY — DX: Gastrointestinal hemorrhage, unspecified: K92.2

## 2011-03-25 HISTORY — DX: Encounter for other specified aftercare: Z51.89

## 2011-03-25 HISTORY — DX: Bronchitis, not specified as acute or chronic: J40

## 2011-03-25 HISTORY — DX: Cerebral infarction, unspecified: I63.9

## 2011-03-25 HISTORY — DX: Anxiety disorder, unspecified: F41.9

## 2011-03-25 HISTORY — DX: Unspecified osteoarthritis, unspecified site: M19.90

## 2011-03-25 HISTORY — DX: Anemia, unspecified: D64.9

## 2011-03-25 HISTORY — DX: Gastro-esophageal reflux disease without esophagitis: K21.9

## 2011-03-25 HISTORY — DX: Restless legs syndrome: G25.81

## 2011-03-25 HISTORY — DX: Essential (primary) hypertension: I10

## 2011-03-25 LAB — POCT I-STAT, CHEM 8
Calcium, Ion: 1.25 mmol/L (ref 1.12–1.32)
Glucose, Bld: 187 mg/dL — ABNORMAL HIGH (ref 70–99)
HCT: 35 % — ABNORMAL LOW (ref 36.0–46.0)
Hemoglobin: 11.9 g/dL — ABNORMAL LOW (ref 12.0–15.0)
TCO2: 28 mmol/L (ref 0–100)

## 2011-03-25 LAB — COMPREHENSIVE METABOLIC PANEL
Albumin: 3.6 g/dL (ref 3.5–5.2)
Alkaline Phosphatase: 61 U/L (ref 39–117)
BUN: 25 mg/dL — ABNORMAL HIGH (ref 6–23)
Calcium: 9.9 mg/dL (ref 8.4–10.5)
Potassium: 4.9 mEq/L (ref 3.5–5.1)
Sodium: 137 mEq/L (ref 135–145)
Total Protein: 6.6 g/dL (ref 6.0–8.3)

## 2011-03-25 LAB — CBC
MCH: 29.5 pg (ref 26.0–34.0)
MCHC: 31.6 g/dL (ref 30.0–36.0)
Platelets: 179 10*3/uL (ref 150–400)

## 2011-03-25 LAB — DIFFERENTIAL
Basophils Relative: 1 % (ref 0–1)
Eosinophils Absolute: 0 10*3/uL (ref 0.0–0.7)
Eosinophils Relative: 1 % (ref 0–5)
Monocytes Relative: 8 % (ref 3–12)
Neutrophils Relative %: 48 % (ref 43–77)

## 2011-03-25 LAB — PROTIME-INR
INR: 1.06 (ref 0.00–1.49)
Prothrombin Time: 14 seconds (ref 11.6–15.2)

## 2011-03-25 LAB — TROPONIN I: Troponin I: <0.3 ng/mL (ref <0.30)

## 2011-03-25 LAB — CK TOTAL AND CKMB (NOT AT ARMC)
Relative Index: INVALID (ref 0.0–2.5)
Total CK: 55 U/L (ref 7–177)

## 2011-03-25 MED ORDER — ASPIRIN EC 81 MG PO TBEC
81.0000 mg | DELAYED_RELEASE_TABLET | Freq: Every day | ORAL | Status: DC
  Filled 2011-03-25 (×2): qty 1

## 2011-03-25 MED ORDER — PIOGLITAZONE HCL 30 MG PO TABS
30.0000 mg | ORAL_TABLET | Freq: Every day | ORAL | Status: DC
  Administered 2011-03-26 – 2011-03-27 (×2): 30 mg via ORAL
  Filled 2011-03-25 (×3): qty 1

## 2011-03-25 MED ORDER — INSULIN ASPART 100 UNIT/ML ~~LOC~~ SOLN
0.0000 [IU] | Freq: Three times a day (TID) | SUBCUTANEOUS | Status: DC
  Administered 2011-03-26: 2 [IU] via SUBCUTANEOUS
  Administered 2011-03-26: 3 [IU] via SUBCUTANEOUS
  Administered 2011-03-27: 2 [IU] via SUBCUTANEOUS
  Administered 2011-03-27: 3 [IU] via SUBCUTANEOUS
  Administered 2011-03-27: 2 [IU] via SUBCUTANEOUS
  Filled 2011-03-25 (×4): qty 3

## 2011-03-25 MED ORDER — ROPINIROLE HCL 1 MG PO TABS
2.0000 mg | ORAL_TABLET | Freq: Four times a day (QID) | ORAL | Status: DC
  Administered 2011-03-25 – 2011-03-27 (×8): 2 mg via ORAL
  Filled 2011-03-25 (×10): qty 2

## 2011-03-25 MED ORDER — SODIUM CHLORIDE 0.9 % IV SOLN
INTRAVENOUS | Status: DC
  Administered 2011-03-26 – 2011-03-27 (×3): via INTRAVENOUS

## 2011-03-25 MED ORDER — METFORMIN HCL ER 750 MG PO TB24
750.0000 mg | ORAL_TABLET | Freq: Two times a day (BID) | ORAL | Status: DC
  Administered 2011-03-26 – 2011-03-27 (×4): 750 mg via ORAL
  Filled 2011-03-25 (×6): qty 1

## 2011-03-25 MED ORDER — ROSUVASTATIN CALCIUM 5 MG PO TABS
5.0000 mg | ORAL_TABLET | Freq: Every day | ORAL | Status: DC
  Administered 2011-03-25 – 2011-03-27 (×3): 5 mg via ORAL
  Filled 2011-03-25 (×3): qty 1

## 2011-03-25 MED ORDER — INSULIN ASPART 100 UNIT/ML ~~LOC~~ SOLN: 4.0000 [IU] | Freq: Three times a day (TID) | SUBCUTANEOUS | Status: DC

## 2011-03-25 MED ORDER — CALCIUM CARBONATE 600 MG PO TABS: 600.0000 mg | ORAL_TABLET | Freq: Two times a day (BID) | ORAL | Status: DC

## 2011-03-25 MED ORDER — ENOXAPARIN SODIUM 40 MG/0.4ML ~~LOC~~ SOLN
40.0000 mg | SUBCUTANEOUS | Status: DC
  Administered 2011-03-25 – 2011-03-26 (×2): 40 mg via SUBCUTANEOUS
  Filled 2011-03-25 (×3): qty 0.4

## 2011-03-25 MED ORDER — PREGABALIN 50 MG PO CAPS
50.0000 mg | ORAL_CAPSULE | Freq: Every day | ORAL | Status: DC
  Administered 2011-03-25 – 2011-03-26 (×2): 50 mg via ORAL
  Filled 2011-03-25 (×2): qty 1

## 2011-03-25 MED ORDER — CARVEDILOL PHOSPHATE ER 20 MG PO CP24
20.0000 mg | ORAL_CAPSULE | Freq: Every day | ORAL | Status: DC
  Administered 2011-03-26 – 2011-03-27 (×2): 20 mg via ORAL
  Filled 2011-03-25 (×2): qty 1

## 2011-03-25 MED ORDER — PANTOPRAZOLE SODIUM 40 MG PO TBEC
40.0000 mg | DELAYED_RELEASE_TABLET | Freq: Every day | ORAL | Status: DC
  Administered 2011-03-25 – 2011-03-27 (×3): 40 mg via ORAL
  Filled 2011-03-25 (×3): qty 1

## 2011-03-25 MED ORDER — OLMESARTAN MEDOXOMIL 20 MG PO TABS
20.0000 mg | ORAL_TABLET | Freq: Every day | ORAL | Status: DC
  Administered 2011-03-26: 20 mg via ORAL
  Filled 2011-03-25 (×2): qty 1

## 2011-03-25 MED ORDER — INFLUENZA VIRUS VACC SPLIT PF IM SUSP
0.5000 mL | INTRAMUSCULAR | Status: AC
  Administered 2011-03-26: 0.5 mL via INTRAMUSCULAR
  Filled 2011-03-25: qty 0.5

## 2011-03-25 MED ORDER — AMLODIPINE BESYLATE 10 MG PO TABS
10.0000 mg | ORAL_TABLET | Freq: Every day | ORAL | Status: DC
  Administered 2011-03-25 – 2011-03-27 (×3): 10 mg via ORAL
  Filled 2011-03-25 (×3): qty 1

## 2011-03-25 MED ORDER — AMLODIPINE-ATORVASTATIN 10-10 MG PO TABS
1.0000 | ORAL_TABLET | Freq: Every day | ORAL | Status: DC
Start: 2011-03-25 — End: 2011-03-25

## 2011-03-25 MED ORDER — CALCIUM CARBONATE 1250 (500 CA) MG PO TABS
1000.0000 mg | ORAL_TABLET | Freq: Two times a day (BID) | ORAL | Status: DC
  Administered 2011-03-26 – 2011-03-27 (×4): 1000 mg via ORAL
  Filled 2011-03-25 (×6): qty 2

## 2011-03-25 NOTE — ED Notes (Signed)
Report given to [place], Charity fundraiser.  All questions answered; NAD noted.  Pt to floor via stretcher.

## 2011-03-25 NOTE — ED Notes (Signed)
Attempted to call report to floor.  Informed nurse unable to take report.  Asked for charge nurse.  Left on hold for 15-18 minutes.  No one returned to phone; finally hung up and informed Martie Lee, RN that report was not being taken.

## 2011-03-25 NOTE — Code Documentation (Signed)
77 yo female who was at home with family who witnessed sudden onset of slurred speech at 1250. EMS was called and activated code stroke at 1314 Stroke team arrived at 1320 Pt arrived at 1326 EDP examined pt at 1328 To CT at 1329 Plebotomist arrived 1328 NIHSS (see doc); pt confused and poorly cooperative. Improving. Now knows month and follows commands. ED RN aware of above.

## 2011-03-25 NOTE — H&P (Addendum)
PCP:  Fleet Contras  DOA:  03/25/2011  1:19 PM  Chief Complaint:  Altered mental status and ? Slurry speech x 1 day  HPI: 77 y/o female with hx of HTN, DM on oral hypoglycemics, hx of CVA in 64 with some residual left sided weakness , anemia, restless leg syndrome was brought in by EMS for AMS. History was primarily provided by patient's daughter. Recent was in her usual state of health this morning when she had her breakfast and was sitting on a chair, she then started him telling that she was unable to get up. I occurred that she is started saying that she is feeling very sleepy. Her daughter noticed that patient was appearing sleepy and her jaws dropping. She checked her blood sugar and was 166. She denies having any falls, noticing tongue bite, or any shakes. She also noticed some sliding off speech initially. She tried to get a blood pressure recording but then decided to call the EMS instead. The daughter informs that patient isn't now about 95% back to her baseline. Her speech is completely normal. Patient had similar episodes in the past but lasted only for a few minutes. Patient on questioning says she does not remember the episode and  only remembers being here in the hospital. She denies any weakness of her arms or legs, denies any tingling or numbness. She denies any chest pain, palpitation, blurring of vision, tinnitus, shortness of breath, nausea, vomiting, fever, abdominal pain, bowel or urinary symptoms.   Allergies: Allergies  Allergen Reactions  . Penicillins Other (See Comments)    unknown    Prior to Admission medications   Medication Sig Start Date End Date Taking? Authorizing Provider  amlodipine-atorvastatin (CADUET) 10-10 MG per tablet Take 1 tablet by mouth daily.     Yes Historical Provider, MD  aspirin EC 81 MG tablet Take 81 mg by mouth daily.     Yes Historical Provider, MD  calcium carbonate (OS-CAL) 600 MG TABS Take 600 mg by mouth 2 (two) times daily with a  meal.     Yes Historical Provider, MD  carvedilol (COREG CR) 20 MG 24 hr capsule Take 20 mg by mouth daily.     Yes Historical Provider, MD  esomeprazole (NEXIUM) 40 MG capsule Take 40 mg by mouth daily before breakfast.     Yes Historical Provider, MD  metFORMIN (GLUCOPHAGE-XR) 750 MG 24 hr tablet Take 750 mg by mouth 2 (two) times daily.     Yes Historical Provider, MD  pioglitazone (ACTOS) 30 MG tablet Take 30 mg by mouth daily.     Yes Historical Provider, MD  pregabalin (LYRICA) 50 MG capsule Take 50 mg by mouth at bedtime.     Yes Historical Provider, MD  rOPINIRole (REQUIP) 1 MG tablet Take 2 mg by mouth 4 (four) times daily.     Yes Historical Provider, MD  telmisartan (MICARDIS) 40 MG tablet Take 40 mg by mouth daily.     Yes Historical Provider, MD    Past medical hx: 1. Upper gastrointestinal bleed withhospitalization in 05/12 2. 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 Acute blood loss anemia on chronic iron deficiency anemia.  4. Syncope versus presyncope secondary to problem anemia  5. Diabetes mellitus type 2.  6. Hypertension.  7. History of cerebrovascular accident  In  80 with residual mild left sided weakness and uses walker for ambulation 8. History of restless legs syndrome.  9. Gastroesophageal reflux disease.  No past surgical history on file.  Social History: Denies smoking, etoh or illicit drug use. Lives with her daughter Daphene . Capable for all her ADLs. Uses walker for ambulation  Family hx:  non contributory  Review of Systems:  Constitutional: Denies fever, chills, diaphoresis, appetite change and fatigue.  HEENT: Denies photophobia, eye pain, redness, hearing loss, ear pain, congestion, sore throat, rhinorrhea, sneezing, mouth sores, trouble swallowing, neck pain, neck stiffness and tinnitus.   Respiratory: Denies SOB, DOE, cough, chest tightness,  and wheezing.   Cardiovascular: Denies chest pain, palpitations  and leg swelling.  Gastrointestinal: Denies nausea, vomiting, abdominal pain, diarrhea, constipation, blood in stool and abdominal distention.  Genitourinary: Denies dysuria, urgency, frequency, hematuria, flank pain and difficulty urinating.  Musculoskeletal: Denies myalgias, back pain, joint swelling, arthralgias and gait problem.  Skin: Denies pallor, rash and wound.  Neurological: Denies dizziness, seizures, syncope, weakness present , no light-headedness, numbness , complains of some headache. has some residual left side weakness from old stroke. Hematological: Denies adenopathy. Easy bruising, personal or family bleeding history  Psychiatric/Behavioral: Denies suicidal ideation, mood changes, confusion, nervousness, sleep disturbance and agitation.   Physical Exam:  Filed Vitals:   03/25/11 1352  BP: 166/68  Pulse: 67  Temp: 97.5 F (36.4 C)  TempSrc: Axillary  Resp: 18  SpO2: 100%    Constitutional: Vital signs reviewed.  Patient is a well-developed and well-nourished in no acute distress and cooperative with exam.  HEENT: no pallor, pupils reaxctive b/l, no icterus, moist oral mucosa, no JVD, no carotid bruit CHEST: clear breathe sounds b/l, no added sounds CVS: N S1 &S2, no murmurs Abd: soft, NT, ND, BS+ EXT: warm, no edema CNS: aaox3, speech normal, pupils reactive b/l , EOMI, normal motor tone, power 4+5 in LLE, normal sensations. Gait not assessed. No cerebellar dysfunction.  Labs on Admission:  Results for orders placed during the hospital encounter of 03/25/11 (from the past 48 hour(s))  PROTIME-INR     Status: Normal   Collection Time   03/25/11  1:31 PM      Component Value Range Comment   Prothrombin Time 14.0  11.6 - 15.2 (seconds)    INR 1.06  0.00 - 1.49    APTT     Status: Normal   Collection Time   03/25/11  1:31 PM      Component Value Range Comment   aPTT 32  24 - 37 (seconds)   CBC     Status: Abnormal   Collection Time   03/25/11  1:31 PM       Component Value Range Comment   WBC 3.5 (*) 4.0 - 10.5 (K/uL)    RBC 3.70 (*) 3.87 - 5.11 (MIL/uL)    Hemoglobin 10.9 (*) 12.0 - 15.0 (g/dL)    HCT 16.1 (*) 09.6 - 46.0 (%)    MCV 93.2  78.0 - 100.0 (fL)    MCH 29.5  26.0 - 34.0 (pg)    MCHC 31.6  30.0 - 36.0 (g/dL)    RDW 04.5  40.9 - 81.1 (%)    Platelets 179  150 - 400 (K/uL)   DIFFERENTIAL     Status: Normal   Collection Time   03/25/11  1:31 PM      Component Value Range Comment   Neutrophils Relative 48  43 - 77 (%)    Neutro Abs 1.7  1.7 - 7.7 (K/uL)    Lymphocytes Relative 43  12 - 46 (%)    Lymphs Abs  1.5  0.7 - 4.0 (K/uL)    Monocytes Relative 8  3 - 12 (%)    Monocytes Absolute 0.3  0.1 - 1.0 (K/uL)    Eosinophils Relative 1  0 - 5 (%)    Eosinophils Absolute 0.0  0.0 - 0.7 (K/uL)    Basophils Relative 1  0 - 1 (%)    Basophils Absolute 0.0  0.0 - 0.1 (K/uL)   COMPREHENSIVE METABOLIC PANEL     Status: Abnormal   Collection Time   03/25/11  1:31 PM      Component Value Range Comment   Sodium 137  135 - 145 (mEq/L)    Potassium 4.9  3.5 - 5.1 (mEq/L)    Chloride 103  96 - 112 (mEq/L)    CO2 24  19 - 32 (mEq/L)    Glucose, Bld 183 (*) 70 - 99 (mg/dL)    BUN 25 (*) 6 - 23 (mg/dL)    Creatinine, Ser 4.09  0.50 - 1.10 (mg/dL)    Calcium 9.9  8.4 - 10.5 (mg/dL)    Total Protein 6.6  6.0 - 8.3 (g/dL)    Albumin 3.6  3.5 - 5.2 (g/dL)    AST 12  0 - 37 (U/L)    ALT 6  0 - 35 (U/L)    Alkaline Phosphatase 61  39 - 117 (U/L)    Total Bilirubin 0.5  0.3 - 1.2 (mg/dL)    GFR calc non Af Amer 63 (*) >90 (mL/min)    GFR calc Af Amer 73 (*) >90 (mL/min)   CK TOTAL AND CKMB     Status: Normal   Collection Time   03/25/11  1:32 PM      Component Value Range Comment   Total CK 55  7 - 177 (U/L)    CK, MB 2.0  0.3 - 4.0 (ng/mL)    Relative Index RELATIVE INDEX IS INVALID  0.0 - 2.5    TROPONIN I     Status: Normal   Collection Time   03/25/11  1:32 PM      Component Value Range Comment   Troponin I <0.30  <0.30 (ng/mL)     POCT I-STAT, CHEM 8     Status: Abnormal   Collection Time   03/25/11  2:02 PM      Component Value Range Comment   Sodium 139  135 - 145 (mEq/L)    Potassium 4.8  3.5 - 5.1 (mEq/L)    Chloride 105  96 - 112 (mEq/L)    BUN 28 (*) 6 - 23 (mg/dL)    Creatinine, Ser 8.11  0.50 - 1.10 (mg/dL)    Glucose, Bld 914 (*) 70 - 99 (mg/dL)    Calcium, Ion 7.82  1.12 - 1.32 (mmol/L)    TCO2 28  0 - 100 (mmol/L)    Hemoglobin 11.9 (*) 12.0 - 15.0 (g/dL)    HCT 95.6 (*) 21.3 - 46.0 (%)     Radiological Exams on Admission: CT HEAD WITHOUT CONTRAST  Technique: Contiguous axial images were obtained from the base of  the skull through the vertex without contrast.  Comparison: 06/21/2006  Findings: Motion degraded images.  No evidence of parenchymal hemorrhage or extra-axial fluid  collection. No mass lesion, mass effect, or midline shift.  No CT evidence of acute infarction.  Subcortical white matter and periventricular small vessel ischemic  changes.  Bilateral basal ganglia lacunar infarcts.  Age related atrophy.  The visualized paranasal sinuses are essentially  clear. The mastoid  air cells are unopacified.  No evidence of calvarial fracture.  IMPRESSION:  Motion degraded images.  No evidence of acute intracranial abnormality.  Age related atrophy with small vessel ischemic changes.   EKG: NSR @73  1st degree AV block, no ST-T changes  CXR: NO ACUTE INFILTRATE  Assessment 77 year old female with history off hypertension, old CVA with residual left-sided mild weakness, chronic anemia, diabetes mellitus, GERD was brought in by EMS for symptoms of slurred speech and altered mental status. Symptoms almost completely resolved in the ED with no new residual neurological deficit. Patient admitted to medical floor on telemetry.   Plan #1 altered mental status  R./O. CVA Patient's symptoms have resolved almost completely Her physical exam is unremarkable with no new neurological deficit. EKG  and cardiac enzymes done in the ED are negative. Patient is stable on telemetry. Head CT done on admission is negative for acute intracranial abnormality. Patient seen by stroke team in the ED [consulted by Dr. Lyman Speller , and deemed not a candidate for TPA. Her symptoms likely appeared to be metabolic versus infectious however need to rule out CVA. Ordered MRI brain and MRA. Follow up with results. -Patient has mildly elevated BUN although clinically appears to be well hydrated. I will order gentle hydration. Also check UA to rule out UTI.  #2 hypertension Stable at this time Continue home medications.  permissive hypertension until MRI results  #3 diabetes mellitus Continue home medications Sliding scale insulin  #4 history of CVA Continue statins.  Patient was on Plavix at home which was discontinued due to history of GI bleed 6 months back  #5 restless leg syndrome Continue ropinirole  Diet cardiac and diabetic  DVT prophylaxis: Subcutaneous Lovenox  CODE STATUS Patient wishes to be full code  Time Spent on Admission: 45 minutes  Karia Ehresman 03/25/2011, 5:26 PM

## 2011-03-25 NOTE — ED Notes (Signed)
Pt to ED via ambulance.  Stroke team present.  Per EMS, pt sitting at kitchen table with daughter and experienced sudden slurred speech and confusion

## 2011-03-25 NOTE — Consult Note (Signed)
Reason for Consult:"confusion, slurred speech, facial droop"  HPI: Elizabeth Pugh is an 77 y.o. female. Who was found to have sudden onset confusion, slurred speech and right facial droop by her family. She was brought in as a stroke code. Her exam was limited due to confusion, but did not appear to have any focal findings. Most of her deficits appeared to be cognitive in nature. Hence, she was not deemed a t-PA candidate. Patient gradually improved in ED.  As per family the patient has had episodes of hypoglycemia in the past when she was confused, but nothing this pronounced and her sugar was fine this am.   PMH:  1. Upper gastrointestinal bleed.   2. Submucosal antral mass on EGD.  Pathology pending.   3. Acute blood loss anemia on chronic iron deficiency anemia.   4. Syncope versus presyncope secondary to problem #3.   5. Diabetes mellitus type 2.   6. Hypertension.   7. History of cerebrovascular accident.   8. History of restless legs syndrome.   9. Gastroesophageal reflux disease.   MEDS:  1. Actos 40 mg p.o. daily at bedtime.   2. Aspirin enteric coated 81 mg p.o. daily at bedtime.   3. Caduet 10/10 mg 1 tablet p.o. daily.   4. Calcium/vitamin D 1 tablet p.o. b.i.d.   5. Colace 100 mg p.o. daily.   6. Coreg CR 20 mg p.o. daily.   7. Ferrous sulfate 325 mg p.o. t.i.d.   8. Lyrica 50 mg p.o. daily.   9. Metformin XR 750 mg p.o. b.i.d.   10.Micardis HCT 80/12.5 mg p.o. daily.   11.Nexium 40 mg p.o. daily.   12.ReQuip 2 mg p.o. 4 times daily.   FH: No family history on file.  Social History:  does not have a smoking history on file. She does not have any smokeless tobacco history on file. Her alcohol and drug histories not on file.  Allergies:  Allergies  Allergen Reactions  . Penicillins Other (See Comments)    unknown   Blood pressure 166/68, pulse 67, temperature 97.5 F (36.4 C), temperature source Axillary, resp. rate 18, SpO2 100.00%. Neurological exam: AAO*1. No  aphasia. Lethargic. Would follow simple commands only inconsistently. EOMI, PERRL, Blink to threat positive bilaterally. Sensation to V1 through V3 areas of the face was intact and symmetric throughout. There was no facial asymmetry. There was mild dysarthria Motor: strength was grossly 5/5 and symmetric throughout. Sensory: was intact throughout to pain throughout. Coordination: finger-to-nose were intact and symmetric bilaterally. Reflexes: were 2+ in upper extremities and 1+ at the knees and 1+ at the ankles. Plantar response was mute bilaterally. Gait: deferred  Results for orders placed during the hospital encounter of 03/25/11 (from the past 48 hour(s))  PROTIME-INR     Status: Normal   Collection Time   03/25/11  1:31 PM      Component Value Range Comment   Prothrombin Time 14.0  11.6 - 15.2 (seconds)    INR 1.06  0.00 - 1.49    APTT     Status: Normal   Collection Time   03/25/11  1:31 PM      Component Value Range Comment   aPTT 32  24 - 37 (seconds)   CBC     Status: Abnormal   Collection Time   03/25/11  1:31 PM      Component Value Range Comment   WBC 3.5 (*) 4.0 - 10.5 (K/uL)    RBC 3.70 (*) 3.87 -  5.11 (MIL/uL)    Hemoglobin 10.9 (*) 12.0 - 15.0 (g/dL)    HCT 47.8 (*) 29.5 - 46.0 (%)    MCV 93.2  78.0 - 100.0 (fL)    MCH 29.5  26.0 - 34.0 (pg)    MCHC 31.6  30.0 - 36.0 (g/dL)    RDW 62.1  30.8 - 65.7 (%)    Platelets 179  150 - 400 (K/uL)   DIFFERENTIAL     Status: Normal   Collection Time   03/25/11  1:31 PM      Component Value Range Comment   Neutrophils Relative 48  43 - 77 (%)    Neutro Abs 1.7  1.7 - 7.7 (K/uL)    Lymphocytes Relative 43  12 - 46 (%)    Lymphs Abs 1.5  0.7 - 4.0 (K/uL)    Monocytes Relative 8  3 - 12 (%)    Monocytes Absolute 0.3  0.1 - 1.0 (K/uL)    Eosinophils Relative 1  0 - 5 (%)    Eosinophils Absolute 0.0  0.0 - 0.7 (K/uL)    Basophils Relative 1  0 - 1 (%)    Basophils Absolute 0.0  0.0 - 0.1 (K/uL)   COMPREHENSIVE METABOLIC PANEL      Status: Abnormal   Collection Time   03/25/11  1:31 PM      Component Value Range Comment   Sodium 137  135 - 145 (mEq/L)    Potassium 4.9  3.5 - 5.1 (mEq/L)    Chloride 103  96 - 112 (mEq/L)    CO2 24  19 - 32 (mEq/L)    Glucose, Bld 183 (*) 70 - 99 (mg/dL)    BUN 25 (*) 6 - 23 (mg/dL)    Creatinine, Ser 8.46  0.50 - 1.10 (mg/dL)    Calcium 9.9  8.4 - 10.5 (mg/dL)    Total Protein 6.6  6.0 - 8.3 (g/dL)    Albumin 3.6  3.5 - 5.2 (g/dL)    AST 12  0 - 37 (U/L)    ALT 6  0 - 35 (U/L)    Alkaline Phosphatase 61  39 - 117 (U/L)    Total Bilirubin 0.5  0.3 - 1.2 (mg/dL)    GFR calc non Af Amer 63 (*) >90 (mL/min)    GFR calc Af Amer 73 (*) >90 (mL/min)   CK TOTAL AND CKMB     Status: Normal   Collection Time   03/25/11  1:32 PM      Component Value Range Comment   Total CK 55  7 - 177 (U/L)    CK, MB 2.0  0.3 - 4.0 (ng/mL)    Relative Index RELATIVE INDEX IS INVALID  0.0 - 2.5    TROPONIN I     Status: Normal   Collection Time   03/25/11  1:32 PM      Component Value Range Comment   Troponin I <0.30  <0.30 (ng/mL)   POCT I-STAT, CHEM 8     Status: Abnormal   Collection Time   03/25/11  2:02 PM      Component Value Range Comment   Sodium 139  135 - 145 (mEq/L)    Potassium 4.8  3.5 - 5.1 (mEq/L)    Chloride 105  96 - 112 (mEq/L)    BUN 28 (*) 6 - 23 (mg/dL)    Creatinine, Ser 9.62  0.50 - 1.10 (mg/dL)    Glucose, Bld 952 (*) 70 -  99 (mg/dL)    Calcium, Ion 4.09  1.12 - 1.32 (mmol/L)    TCO2 28  0 - 100 (mmol/L)    Hemoglobin 11.9 (*) 12.0 - 15.0 (g/dL)    HCT 81.1 (*) 91.4 - 46.0 (%)    ROS: unobtainable  Ct Head Wo Contrast  03/25/2011  *RADIOLOGY REPORT*  Clinical Data: Code stroke  CT HEAD WITHOUT CONTRAST  Technique:  Contiguous axial images were obtained from the base of the skull through the vertex without contrast.  Comparison: 06/21/2006  Findings: Motion degraded images.  No evidence of parenchymal hemorrhage or extra-axial fluid collection. No mass lesion,  mass effect, or midline shift.  No CT evidence of acute infarction.  Subcortical white matter and periventricular small vessel ischemic changes.  Bilateral basal ganglia lacunar infarcts.  Age related atrophy.  The visualized paranasal sinuses are essentially clear. The mastoid air cells are unopacified.  No evidence of calvarial fracture.  IMPRESSION: Motion degraded images.  No evidence of acute intracranial abnormality.  Age related atrophy with small vessel ischemic changes.  Original Report Authenticated By: Charline Bills, M.D.   Dg Chest Portable 1 View  03/25/2011  *RADIOLOGY REPORT*  Clinical Data: Confusion, altered mental status, weakness, code stroke  PORTABLE CHEST - 1 VIEW  Comparison: Portable exam 1359 hours compared to 09/18/2010  Findings: Enlargement of cardiac silhouette. Prominence of central pulmonary arteries and hila unchanged. Tortuous aorta. Pulmonary vascular congestion. No gross infiltrate, pleural effusion, or pneumothorax. Bones demineralized.  IMPRESSION: Enlargement of cardiac silhouette with prominent central pulmonary arteries, question pulmonary arterial hypertension. No definite acute infiltrate.  Original Report Authenticated By: Lollie Marrow, M.D.   Assessment/Plan: 77 years old woman comes with with confusion, slurred speech, transient facial droop. It appears that the patient is delirious with no real focal findings on exam. I did not feel there was a sufficient evidence to suspect a large stroke although I cannot rule one out without an MRI. Suspect toxic, metabolic or infectious etiology.  1) U/A, UCX, CXR if she has cough, fever 2) Address anemia 3) MRI brain/MRA head 4) Will follow  Keyah Blizard 03/25/2011, 3:13 PM

## 2011-03-25 NOTE — ED Notes (Signed)
Attempted to get urine from pt. Put Pt on bedpan twice. Did not go.

## 2011-03-25 NOTE — ED Provider Notes (Signed)
History     CSN: [id number] Arrival date & time: 03/25/2011  1:19 PM   First MD Initiated Contact with Patient 03/25/11 1328      Chief Complaint  Patient presents with  . Code Stroke    (Consider location/radiation/quality/duration/timing/severity/associated sxs/prior treatment) HPI Comments: Patient presents by EMS as a code stroke with acute change in mental status. She sitting at the kitchen table with her daughter and all of a sudden had slurred speech and confusion. She came to the ED confused and disoriented but her symptoms seem to be improving. Stroke team was present on arrival.  Patient was moving all extremities equally did not appear to have any deficits though she was confused and slightly combative. Upon return from CT which was negative patient was more calm and became oriented. Per her family she is not quite back to baseline as she is still confused and at about 80% per them. Patient is awake and alert denies any type of pain.  She does have a history of diabetes but reports no hypoglycemia with this episode. Family states his episodes of In the past or she becomes less responsive but this one lasted longer than usual. She denies any cough, fever, dysuria, hematuria, abdominal pain or chest pain.  The history is provided by the patient, the EMS personnel and a relative.    No past medical history on file.  No past surgical history on file.  No family history on file.  History  Substance Use Topics  . Smoking status: Not on file  . Smokeless tobacco: Not on file  . Alcohol Use: Not on file    OB History    No data available      Review of Systems  All other systems reviewed and are negative.    Allergies  Penicillins  Home Medications   No current outpatient prescriptions on file.  BP 118/57  Pulse 76  Temp(Src) 97.5 F (36.4 C) (Axillary)  Resp 18  SpO2 95%  Physical Exam  Constitutional: She is oriented to person, place, and time. She  appears well-developed and well-nourished. No distress.  HENT:  Head: Normocephalic and atraumatic.  Mouth/Throat: Oropharynx is clear and moist. No oropharyngeal exudate.  Eyes: Conjunctivae are normal. Pupils are equal, round, and reactive to light.  Neck: Normal range of motion.  Cardiovascular: Normal rate, regular rhythm and normal heart sounds.   Pulmonary/Chest: Effort normal and breath sounds normal. No respiratory distress.  Abdominal: Soft. There is no tenderness. There is no rebound and no guarding.  Musculoskeletal: Normal range of motion. She exhibits no edema and no tenderness.  Neurological: She is alert and oriented to person, place, and time. No cranial nerve deficit.       No facial asymmetry, no tongue deviation, and grip strengths bilaterally with equal push and pull.    5/5 strength in the lower extremities, able to resist gravity.  Skin: Skin is warm.    ED Course  Procedures (including critical care time)  Labs Reviewed  CBC - Abnormal; Notable for the following:    WBC 3.5 (*)    RBC 3.70 (*)    Hemoglobin 10.9 (*)    HCT 34.5 (*)    All other components within normal limits  COMPREHENSIVE METABOLIC PANEL - Abnormal; Notable for the following:    Glucose, Bld 183 (*)    BUN 25 (*)    GFR calc non Af Amer 63 (*)    GFR calc Af Amer 73 (*)  All other components within normal limits  POCT I-STAT, CHEM 8 - Abnormal; Notable for the following:    BUN 28 (*)    Glucose, Bld 187 (*)    Hemoglobin 11.9 (*)    HCT 35.0 (*)    All other components within normal limits  GLUCOSE, CAPILLARY - Abnormal; Notable for the following:    Glucose-Capillary 130 (*)    All other components within normal limits  PROTIME-INR  APTT  DIFFERENTIAL  CK TOTAL AND CKMB  TROPONIN I  I-STAT, CHEM 8  URINALYSIS, ROUTINE W REFLEX MICROSCOPIC  CBC  POCT CBG MONITORING  BASIC METABOLIC PANEL   Ct Head Wo Contrast  03/25/2011  *RADIOLOGY REPORT*  Clinical Data: Code stroke   CT HEAD WITHOUT CONTRAST  Technique:  Contiguous axial images were obtained from the base of the skull through the vertex without contrast.  Comparison: 06/21/2006  Findings: Motion degraded images.  No evidence of parenchymal hemorrhage or extra-axial fluid collection. No mass lesion, mass effect, or midline shift.  No CT evidence of acute infarction.  Subcortical white matter and periventricular small vessel ischemic changes.  Bilateral basal ganglia lacunar infarcts.  Age related atrophy.  The visualized paranasal sinuses are essentially clear. The mastoid air cells are unopacified.  No evidence of calvarial fracture.  IMPRESSION: Motion degraded images.  No evidence of acute intracranial abnormality.  Age related atrophy with small vessel ischemic changes.  Original Report Authenticated By: Charline Bills, M.D.   Dg Chest Portable 1 View  03/25/2011  *RADIOLOGY REPORT*  Clinical Data: Confusion, altered mental status, weakness, code stroke  PORTABLE CHEST - 1 VIEW  Comparison: Portable exam 1359 hours compared to 09/18/2010  Findings: Enlargement of cardiac silhouette. Prominence of central pulmonary arteries and hila unchanged. Tortuous aorta. Pulmonary vascular congestion. No gross infiltrate, pleural effusion, or pneumothorax. Bones demineralized.  IMPRESSION: Enlargement of cardiac silhouette with prominent central pulmonary arteries, question pulmonary arterial hypertension. No definite acute infiltrate.  Original Report Authenticated By: Lollie Marrow, M.D.     1. Altered mental status       MDM  Acute mental status change with confusion and slurred speech. No facial weakness on exam. Symptoms seem to be improving. Neurology team agrees this is not consistent with a stroke.  Will need to workup for some type of encephalopathy from metabolic or infectious etiology.  TIA v hypoglycemia v hypotension v syncope   Almost back to baseline per family.  NO new neuro deficits.  Will need  admission for observation, MRI, and further workup.     Date: 03/25/2011  Rate: 73  Rhythm: normal sinus rhythm  QRS Axis: normal  Intervals: normal  ST/T Wave abnormalities: normal  Conduction Disutrbances:none  Narrative Interpretation:   Old EKG Reviewed: unchanged    Glynn Octave, MD 03/25/11 2029

## 2011-03-25 NOTE — ED Notes (Signed)
Code stroke ended.

## 2011-03-25 NOTE — ED Notes (Signed)
New and old ekg handed to Dr.Rancour at 10

## 2011-03-26 ENCOUNTER — Other Ambulatory Visit: Payer: Self-pay

## 2011-03-26 LAB — CBC
HCT: 32 % — ABNORMAL LOW (ref 36.0–46.0)
MCH: 30.1 pg (ref 26.0–34.0)
MCHC: 32.5 g/dL (ref 30.0–36.0)
MCV: 92.8 fL (ref 78.0–100.0)
RDW: 14.6 % (ref 11.5–15.5)

## 2011-03-26 LAB — GLUCOSE, CAPILLARY
Glucose-Capillary: 139 mg/dL — ABNORMAL HIGH (ref 70–99)
Glucose-Capillary: 199 mg/dL — ABNORMAL HIGH (ref 70–99)

## 2011-03-26 LAB — URINALYSIS, ROUTINE W REFLEX MICROSCOPIC
Bilirubin Urine: NEGATIVE
Ketones, ur: NEGATIVE mg/dL
Leukocytes, UA: NEGATIVE
Nitrite: NEGATIVE
Protein, ur: NEGATIVE mg/dL
Urobilinogen, UA: 1 mg/dL (ref 0.0–1.0)
pH: 6.5 (ref 5.0–8.0)

## 2011-03-26 LAB — BASIC METABOLIC PANEL
BUN: 20 mg/dL (ref 6–23)
Calcium: 9.4 mg/dL (ref 8.4–10.5)
Creatinine, Ser: 0.77 mg/dL (ref 0.50–1.10)
GFR calc Af Amer: 88 mL/min — ABNORMAL LOW (ref >90)
GFR calc non Af Amer: 76 mL/min — ABNORMAL LOW (ref >90)
Potassium: 3.7 mEq/L (ref 3.5–5.1)

## 2011-03-26 LAB — TSH: TSH: 0.531 u[IU]/mL (ref 0.350–4.500)

## 2011-03-26 MED ORDER — ASPIRIN EC 81 MG PO TBEC
81.0000 mg | DELAYED_RELEASE_TABLET | Freq: Every day | ORAL | Status: DC
  Administered 2011-03-26: 81 mg via ORAL
  Filled 2011-03-26 (×3): qty 1

## 2011-03-26 NOTE — Progress Notes (Signed)
03/25/11 2030 - Pt received to 6702, non-tele via stretcher. A&Ox3. No distress noted. Daughter at bedside. No contact precautions. Skin intact, but fluid filled blister noted on left thigh/buttock area. Pt is aware of the blister being there. Oriented to unit and surroundings. Educated on call bell use. Will continue to monitor.  Hortense Ramal, RN.

## 2011-03-26 NOTE — Consult Note (Signed)
Subjective: Patient says that she is feeling better although cannot recall how she got to the hospital.   Objective: Vital signs in last 24 hours: Temp:  [97.5 F (36.4 C)-98.9 F (37.2 C)] 98.1 F (36.7 C) (11/22 0556) Pulse Rate:  [67-87] 67  (11/22 0556) Resp:  [17-19] 17  (11/22 0556) BP: (114-166)/(57-68) 134/68 mmHg (11/22 0556) SpO2:  [93 %-100 %] 93 % (11/22 0556) Weight:  [90.855 kg (200 lb 4.8 oz)] 200 lb 4.8 oz (90.855 kg) (11/21 2033)  Intake/Output from previous day: 11/21 0701 - 11/22 0700 In: 736.3 [P.O.:240; I.V.:496.3] Out: -  Intake/Output this shift:   Nutritional status: Cardiac  Neurological exam: AAO*2. No aphasia. Followed complex commands. EOMI, PERRL, Blink to threat positive bilaterally. Sensation to V1 through V3 areas of the face was intact and symmetric throughout. There was no facial asymmetry. There was mild dysarthria Motor: strength was grossly 5/5 and symmetric throughout. Sensory: was intact throughout to pain throughout. Coordination: finger-to-nose were intact and symmetric bilaterally. Reflexes: were 2+ in upper extremities and 1+ at the knees and 1+ at the ankles. Plantar response was mute bilaterally. Gait: deferred  Lab Results:  Basename 03/26/11 0600 03/25/11 1402 03/25/11 1331  WBC 2.9* -- 3.5*  HGB 10.4* 11.9* --  HCT 32.0* 35.0* --  PLT 178 -- 179  NA 142 139 --  K 3.7 4.8 --  CL 106 105 --  CO2 29 -- 24  GLUCOSE 107* 187* --  BUN 20 28* --  CREATININE 0.77 0.90 --  CALCIUM 9.4 -- 9.9  LABA1C -- -- --   Studies/Results: Ct Head Wo Contrast  03/25/2011  *RADIOLOGY REPORT*  Clinical Data: Code stroke  CT HEAD WITHOUT CONTRAST  Technique:  Contiguous axial images were obtained from the base of the skull through the vertex without contrast.  Comparison: 06/21/2006  Findings: Motion degraded images.  No evidence of parenchymal hemorrhage or extra-axial fluid collection. No mass lesion, mass effect, or midline shift.  No CT evidence  of acute infarction.  Subcortical white matter and periventricular small vessel ischemic changes.  Bilateral basal ganglia lacunar infarcts.  Age related atrophy.  The visualized paranasal sinuses are essentially clear. The mastoid air cells are unopacified.  No evidence of calvarial fracture.  IMPRESSION: Motion degraded images.  No evidence of acute intracranial abnormality.  Age related atrophy with small vessel ischemic changes.  Original Report Authenticated By: Charline Bills, M.D.   Dg Chest Portable 1 View  03/25/2011  *RADIOLOGY REPORT*  Clinical Data: Confusion, altered mental status, weakness, code stroke  PORTABLE CHEST - 1 VIEW  Comparison: Portable exam 1359 hours compared to 09/18/2010  Findings: Enlargement of cardiac silhouette. Prominence of central pulmonary arteries and hila unchanged. Tortuous aorta. Pulmonary vascular congestion. No gross infiltrate, pleural effusion, or pneumothorax. Bones demineralized.  IMPRESSION: Enlargement of cardiac silhouette with prominent central pulmonary arteries, question pulmonary arterial hypertension. No definite acute infiltrate.  Original Report Authenticated By: Lollie Marrow, M.D.   Medications: I have reviewed the patient's current medications.  Assessment/Plan: 77 years old woman with acute confusion, slurred speech and facial droop of unclear etiology. Subsequently patient was confused and lethargic. MRI negative for CVA. No evidence of urinary infection. I wonder if patient had a seizure. 1) EEG 2) Will follow  LOS: 1 day   Hensley Treat

## 2011-03-26 NOTE — Progress Notes (Addendum)
Subjective: Patient seen and examined this am. Denies any symptoms.  Objective:  Vital signs in last 24 hours:  Filed Vitals:   03/25/11 1352 03/25/11 1958 03/25/11 2033 03/26/11 0556  BP: 166/68 118/57 114/66 134/68  Pulse: 67 76 87 67  Temp: 97.5 F (36.4 C)  98.9 F (37.2 C) 98.1 F (36.7 C)  TempSrc: Axillary  Oral Oral  Resp: 18  19 17   Height:   5\' 5"  (1.651 m)   Weight:   90.855 kg (200 lb 4.8 oz)   SpO2: 100% 95% 95% 93%    Intake/Output from previous day:   Intake/Output Summary (Last 24 hours) at 03/26/11 1142 Last data filed at 03/26/11 0700  Gross per 24 hour  Intake 736.25 ml  Output      0 ml  Net 736.25 ml    Physical Exam:  General: patient lying in bed in  no acute distress. Very pleasant  HEENT: no pallor, pupils reactive b/l, no icterus, moist oral mucosa, no JVD, no carotid bruit  CHEST: clear breath sounds b/l, no added sounds  CVS: N S1 &S2, no murmurs  Abd: soft, NT, ND, BS+  EXT: warm, no edema  CNS: AAOx3, speech normal, pupils reactive b/l , EOMI, normal motor tone, power 4+5 in LLE, reflexes 2+ B/L , normal sensations. Gait not assessed. No cerebellar dysfunction.   Lab Results:  Basic Metabolic Panel:    Component Value Date/Time   NA 142 03/26/2011 0600   K 3.7 03/26/2011 0600   CL 106 03/26/2011 0600   CO2 29 03/26/2011 0600   BUN 20 03/26/2011 0600   CREATININE 0.77 03/26/2011 0600   GLUCOSE 107* 03/26/2011 0600   CALCIUM 9.4 03/26/2011 0600   CBC:    Component Value Date/Time   WBC 2.9* 03/26/2011 0600   HGB 10.4* 03/26/2011 0600   HCT 32.0* 03/26/2011 0600   PLT 178 03/26/2011 0600   MCV 92.8 03/26/2011 0600   NEUTROABS 1.7 03/25/2011 1331   LYMPHSABS 1.5 03/25/2011 1331   MONOABS 0.3 03/25/2011 1331   EOSABS 0.0 03/25/2011 1331   BASOSABS 0.0 03/25/2011 1331    No results found for this or any previous visit (from the past 240 hour(s)).  Studies/Results: Ct Head Wo Contrast  03/25/2011  *RADIOLOGY REPORT*   Clinical Data: Code stroke  CT HEAD WITHOUT CONTRAST  Technique:  Contiguous axial images were obtained from the base of the skull through the vertex without contrast.  Comparison: 06/21/2006  Findings: Motion degraded images.  No evidence of parenchymal hemorrhage or extra-axial fluid collection. No mass lesion, mass effect, or midline shift.  No CT evidence of acute infarction.  Subcortical white matter and periventricular small vessel ischemic changes.  Bilateral basal ganglia lacunar infarcts.  Age related atrophy.  The visualized paranasal sinuses are essentially clear. The mastoid air cells are unopacified.  No evidence of calvarial fracture.  IMPRESSION: Motion degraded images.  No evidence of acute intracranial abnormality.  Age related atrophy with small vessel ischemic changes.  Original Report Authenticated By: Charline Bills, M.D.   Mr Brain Wo Contrast  03/26/2011  *RADIOLOGY REPORT*  Clinical Data: 77 year old female with lower extremity weakness, possible stroke.  MRI HEAD WITHOUT CONTRAST  Technique:  Multiplanar, multiecho pulse sequences of the brain and surrounding structures were obtained according to standard protocol without intravenous contrast.  Comparison: Head CTs 03/25/2011 and earlier.  Findings: Study is intermittently degraded by motion artifact despite repeated imaging attempts.  Stable cerebral volume. No restricted diffusion  to suggest acute infarction.  No midline shift, mass effect, evidence of mass lesion, ventriculomegaly, extra-axial collection or acute intracranial hemorrhage.  Cervicomedullary junction and pituitary are within normal limits.  Major intracranial vascular flow voids are preserved; intracranial artery dolichoectasia is noted.  Multi focal chronic lacunar infarcts in the deep gray matter nuclei and brain stem.  Dilated perivascular spaces in the cerebral hemispheres.  Degenerative changes in the upper cervical spine not well evaluated due to motion.   Decreased T1 marrow signal in the central clivus appears to relate to an area of chronic osteopenia.  This probably is not changed since 2008 and is suboptimally characterized today due to motion. Bone marrow signal elsewhere is within normal limits.  Right maxillary sinus mucous retention cyst.  Postoperative changes to the globes.  Grossly negative scalp soft tissues.  IMPRESSION: 1.  Degraded by motion despite repeated imaging attempts. 2. No acute intracranial abnormality.  Chronic small vessel ischemia. 3.  Marrow heterogeneity in the clivus appears to be chronic and benign.  Original Report Authenticated By: Harley Hallmark, M.D.   Dg Chest Portable 1 View  03/25/2011  *RADIOLOGY REPORT*  Clinical Data: Confusion, altered mental status, weakness, code stroke  PORTABLE CHEST - 1 VIEW  Comparison: Portable exam 1359 hours compared to 09/18/2010  Findings: Enlargement of cardiac silhouette. Prominence of central pulmonary arteries and hila unchanged. Tortuous aorta. Pulmonary vascular congestion. No gross infiltrate, pleural effusion, or pneumothorax. Bones demineralized.  IMPRESSION: Enlargement of cardiac silhouette with prominent central pulmonary arteries, question pulmonary arterial hypertension. No definite acute infiltrate.  Original Report Authenticated By: Lollie Marrow, M.D.    Medications: Scheduled Meds:   . amLODipine  10 mg Oral Daily  . aspirin EC  81 mg Oral QHS  . calcium carbonate  1,000 mg of elemental calcium Oral BID WC  . carvedilol  20 mg Oral Daily  . enoxaparin  40 mg Subcutaneous Q24H  . influenza  inactive virus vaccine  0.5 mL Intramuscular Tomorrow-1000  . insulin aspart  0-15 Units Subcutaneous TID WC  . metFORMIN  750 mg Oral BID WC  . olmesartan  20 mg Oral Daily  . pantoprazole  40 mg Oral Daily  . pioglitazone  30 mg Oral Daily  . pregabalin  50 mg Oral QHS  . rOPINIRole  2 mg Oral QID  . rosuvastatin  5 mg Oral Daily  . DISCONTD: amlodipine-atorvastatin  1  tablet Oral Daily  . DISCONTD: aspirin EC  81 mg Oral Daily  . DISCONTD: calcium carbonate  600 mg Oral BID WC  . DISCONTD: insulin aspart  4 Units Subcutaneous TID WC   Continuous Infusions:   . sodium chloride 75 mL/hr at 03/26/11 0015   PRN Meds:. 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 Symptoms almost completely resolved in the ED with no new residual neurological deficit. Patient admitted to medical floor on telemetry.   Plan   altered mental status Patient's symptoms  resolved Her physical exam is unremarkable with no new neurological deficit. EKG and cardiac enzymes done in the ED negative.  -stable on telemetry.  Head CT done on admission is negative for acute intracranial abnormality.  Patient seen by stroke team in the ED [consulted by Dr. Lyman Speller , and deemed not a candidate for TPA. Her symptoms likely appeared to be metabolic versus  infectious however need to rule out CVA. Ordered MRI brain which is negative for ischemia. -UA neg for UTI neurology recommends getting EEG to r/o possible seizure which i agree to. i have discussed this with the daughter yesterday regarding any seizure like activity during the episode and she mentioned not witnessing any.    hypertension  Stable at this time  Continue home medications.    diabetes mellitus  Continue home medications  Sliding scale insulin    history of CVA  Continue statin.  Patient was on Plavix at home which was discontinued due to history of GI bleed 6 months back . Will continue  low dose ASA.   restless leg syndrome  Continue ropinirole   Anemia Low iron and iron sat with low normal ferritin from workup in may 2012. MCV was noted to be low  at that time as well. i will start her on iron supplements. Low normal b12 as well.  Will check repeat b12 level and TSH  Diet cardiac and diabetic   DVT prophylaxis:  Subcutaneous Lovenox   CODE STATUS  Patient wishes to be full code       LOS: 1 day   Banita Lehn 03/26/2011, 11:43 AM

## 2011-03-27 ENCOUNTER — Inpatient Hospital Stay (HOSPITAL_COMMUNITY): Payer: Medicare Other

## 2011-03-27 DIAGNOSIS — R55 Syncope and collapse: Secondary | ICD-10-CM | POA: Diagnosis present

## 2011-03-27 LAB — GLUCOSE, CAPILLARY
Glucose-Capillary: 131 mg/dL — ABNORMAL HIGH (ref 70–99)
Glucose-Capillary: 154 mg/dL — ABNORMAL HIGH (ref 70–99)
Glucose-Capillary: 126 mg/dL — ABNORMAL HIGH (ref 70–99)

## 2011-03-27 LAB — CBC
HCT: 33.4 % — ABNORMAL LOW (ref 36.0–46.0)
MCH: 29.5 pg (ref 26.0–34.0)
MCHC: 32 g/dL (ref 30.0–36.0)
RDW: 14.5 % (ref 11.5–15.5)

## 2011-03-27 MED ORDER — OLMESARTAN MEDOXOMIL 40 MG PO TABS
40.0000 mg | ORAL_TABLET | Freq: Every day | ORAL | Status: DC
  Administered 2011-03-27: 40 mg via ORAL
  Filled 2011-03-27: qty 1

## 2011-03-27 NOTE — Consult Note (Addendum)
Subjective: Patient is doing well today. No complaints. No further episodes of confusion.   Objective: Vital signs in last 24 hours: Temp:  [97.4 F (36.3 C)-98.4 F (36.9 C)] 98.1 F (36.7 C) (11/23 0546) Pulse Rate:  [62-78] 66  (11/23 0546) Resp:  [16-20] 16  (11/23 0546) BP: (121-160)/(64-84) 160/79 mmHg (11/23 0546) SpO2:  [93 %-97 %] 95 % (11/23 0546)  Nutritional status: Cardiac  Neuro Exam: AAO*3, no aphasia, follows complex commands, EOMI, PERRL, no face asymmetry, tongue midline, no pronator drift, 5/5 strength throughout, no deficit to LT/PP throughout, no dysmetria on F to N bilaterally, no ataxia  Lab Results:  Basename 03/26/11 0600 03/25/11 1402 03/25/11 1331  WBC 2.9* -- 3.5*  HGB 10.4* 11.9* --  HCT 32.0* 35.0* --  PLT 178 -- 179  NA 142 139 --  K 3.7 4.8 --  CL 106 105 --  CO2 29 -- 24  GLUCOSE 107* 187* --  BUN 20 28* --  CREATININE 0.77 0.90 --  CALCIUM 9.4 -- 9.9  LABA1C -- -- --   Studies/Results: Ct Head Wo Contrast  03/25/2011  *RADIOLOGY REPORT*  Clinical Data: Code stroke  CT HEAD WITHOUT CONTRAST  Technique:  Contiguous axial images were obtained from the base of the skull through the vertex without contrast.  Comparison: 06/21/2006  Findings: Motion degraded images.  No evidence of parenchymal hemorrhage or extra-axial fluid collection. No mass lesion, mass effect, or midline shift.  No CT evidence of acute infarction.  Subcortical white matter and periventricular small vessel ischemic changes.  Bilateral basal ganglia lacunar infarcts.  Age related atrophy.  The visualized paranasal sinuses are essentially clear. The mastoid air cells are unopacified.  No evidence of calvarial fracture.  IMPRESSION: Motion degraded images.  No evidence of acute intracranial abnormality.  Age related atrophy with small vessel ischemic changes.  Original Report Authenticated By: Charline Bills, M.D.   Mr Brain Wo Contrast  03/26/2011  *RADIOLOGY REPORT*  Clinical  Data: 77 year old female with lower extremity weakness, possible stroke.  MRI HEAD WITHOUT CONTRAST  Technique:  Multiplanar, multiecho pulse sequences of the brain and surrounding structures were obtained according to standard protocol without intravenous contrast.  Comparison: Head CTs 03/25/2011 and earlier.  Findings: Study is intermittently degraded by motion artifact despite repeated imaging attempts.  Stable cerebral volume. No restricted diffusion to suggest acute infarction.  No midline shift, mass effect, evidence of mass lesion, ventriculomegaly, extra-axial collection or acute intracranial hemorrhage.  Cervicomedullary junction and pituitary are within normal limits.  Major intracranial vascular flow voids are preserved; intracranial artery dolichoectasia is noted.  Multi focal chronic lacunar infarcts in the deep gray matter nuclei and brain stem.  Dilated perivascular spaces in the cerebral hemispheres.  Degenerative changes in the upper cervical spine not well evaluated due to motion.  Decreased T1 marrow signal in the central clivus appears to relate to an area of chronic osteopenia.  This probably is not changed since 2008 and is suboptimally characterized today due to motion. Bone marrow signal elsewhere is within normal limits.  Right maxillary sinus mucous retention cyst.  Postoperative changes to the globes.  Grossly negative scalp soft tissues.  IMPRESSION: 1.  Degraded by motion despite repeated imaging attempts. 2. No acute intracranial abnormality.  Chronic small vessel ischemia. 3.  Marrow heterogeneity in the clivus appears to be chronic and benign.  Original Report Authenticated By: Harley Hallmark, M.D.   Dg Chest Portable 1 View  03/25/2011  *RADIOLOGY REPORT*  Clinical Data: Confusion, altered mental status, weakness, code stroke  PORTABLE CHEST - 1 VIEW  Comparison: Portable exam 1359 hours compared to 09/18/2010  Findings: Enlargement of cardiac silhouette. Prominence of central  pulmonary arteries and hila unchanged. Tortuous aorta. Pulmonary vascular congestion. No gross infiltrate, pleural effusion, or pneumothorax. Bones demineralized.  IMPRESSION: Enlargement of cardiac silhouette with prominent central pulmonary arteries, question pulmonary arterial hypertension. No definite acute infiltrate.  Original Report Authenticated By: Lollie Marrow, M.D.   Medications: I have reviewed the patient's current medications.  Assessment/Plan: 77 years old woman with unclear sudden episode of confusion, slurred speech, facial droop - in ED appeared to be delirious and encephalopathic. Doubtful this was a TIA. Will get an EEG to rule out seizure. IF EEG shows no epileptiform activity, cleared by neurology to be discharged.  LOS: 2 days   Henley Boettner  EEG = normal awake and drowsy study, irregular rhythm on EKG.  Firman Petrow

## 2011-03-27 NOTE — Discharge Summary (Addendum)
Patient ID: Elizabeth Pugh MRN: [id number] DOB/AGE: June 27, 1926 77 y.o.  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 EGD with bx done unremarkable. Off plavix since then -Grade 2 diastolic dysfn  -hypertension     Current Discharge Medication List    CONTINUE these medications which have NOT CHANGED   Details  amlodipine-atorvastatin (CADUET) 10-10 MG per tablet Take 1 tablet by mouth daily.      aspirin EC 81 MG tablet Take 81 mg by mouth daily.      calcium carbonate (OS-CAL) 600 MG TABS Take 600 mg by mouth 2 (two) times daily with a meal.      carvedilol (COREG CR) 20 MG 24 hr capsule Take 20 mg by mouth daily.      esomeprazole (NEXIUM) 40 MG capsule Take 40 mg by mouth daily before breakfast.      metFORMIN (GLUCOPHAGE-XR) 750 MG 24 hr tablet Take 750 mg by mouth 2 (two) times daily.      pioglitazone (ACTOS) 30 MG tablet Take 30 mg by mouth daily.      pregabalin (LYRICA) 50 MG capsule Take 50 mg by mouth at bedtime.      rOPINIRole (REQUIP) 1 MG tablet Take 2 mg by mouth 4 (four) times daily.      telmisartan (MICARDIS) 40 MG tablet Take 40 mg by mouth daily.          Disposition and Follow-up:  Follow up with PCP in 1 week Follow up with Dr Rinaldo Cloud in 1-2 weks for outpatient Holter monitoring . patient should call his office to schedule appt. i have spoken to Dr Sharyn Lull over the phone today  Consults:   Lyman Speller ( neurology)  Significant Diagnostic Studies:  Ct Head Wo Contrast  03/25/2011  *RADIOLOGY REPORT*  Clinical Data: Code stroke  CT HEAD WITHOUT CONTRAST  Technique:  Contiguous axial images were obtained from the base of the skull through the  vertex without contrast.  Comparison: 06/21/2006  Findings: Motion degraded images.  No evidence of parenchymal hemorrhage or extra-axial fluid collection. No mass lesion, mass effect, or midline shift.  No CT evidence of acute infarction.  Subcortical white matter and periventricular small vessel ischemic changes.  Bilateral basal ganglia lacunar infarcts.  Age related atrophy.  The visualized paranasal sinuses are essentially clear. The mastoid air cells are unopacified.  No evidence of calvarial fracture.  IMPRESSION: Motion degraded images.  No evidence of acute intracranial abnormality.  Age related atrophy with small vessel ischemic changes.  Original Report Authenticated By: Charline Bills, M.D.   Mr Brain Wo Contrast  03/26/2011  *RADIOLOGY REPORT*  Clinical Data: 77 year old female with lower extremity weakness, possible stroke.  MRI HEAD WITHOUT CONTRAST  Technique:  Multiplanar, multiecho pulse sequences of the brain and surrounding structures were obtained according to standard protocol without intravenous contrast.  Comparison: Head CTs 03/25/2011 and earlier.  Findings: Study is intermittently degraded by motion artifact despite repeated imaging attempts.  Stable cerebral volume. No restricted diffusion to suggest acute infarction.  No midline shift, mass effect, evidence of mass lesion, ventriculomegaly, extra-axial collection or acute intracranial hemorrhage.  Cervicomedullary junction and pituitary are within normal limits.  Major intracranial vascular flow voids are  preserved; intracranial artery dolichoectasia is noted.  Multi focal chronic lacunar infarcts in the deep gray matter nuclei and brain stem.  Dilated perivascular spaces in the cerebral hemispheres.  Degenerative changes in the upper cervical spine not well evaluated due to motion.  Decreased T1 marrow signal in the central clivus appears to relate to an area of chronic osteopenia.  This probably is not changed since 2008 and is  suboptimally characterized today due to motion. Bone marrow signal elsewhere is within normal limits.  Right maxillary sinus mucous retention cyst.  Postoperative changes to the globes.  Grossly negative scalp soft tissues.  IMPRESSION: 1.  Degraded by motion despite repeated imaging attempts. 2. No acute intracranial abnormality.  Chronic small vessel ischemia. 3.  Marrow heterogeneity in the clivus appears to be chronic and benign.  Original Report Authenticated By: Harley Hallmark, M.D.   Dg Chest Portable 1 View  03/25/2011  *RADIOLOGY REPORT*  Clinical Data: Confusion, altered mental status, weakness, code stroke  PORTABLE CHEST - 1 VIEW  Comparison: Portable exam 1359 hours compared to 09/18/2010  Findings: Enlargement of cardiac silhouette. Prominence of central pulmonary arteries and hila unchanged. Tortuous aorta. Pulmonary vascular congestion. No gross infiltrate, pleural effusion, or pneumothorax. Bones demineralized.  IMPRESSION: Enlargement of cardiac silhouette with prominent central pulmonary arteries, question pulmonary arterial hypertension. No definite acute infiltrate.  Original Report Authenticated By: Lollie Marrow, M.D.   EEG on 11/23)  negative for epileptiform activity as per neurology. Official result pending  Brief H and P: For complete details please refer to admission H and P, but in brief 77 y/o female with hx of HTN, DM on oral hypoglycemics, hx of CVA in 55 with some residual left sided weakness , anemia, restless leg syndrome was brought in by EMS for AMS. History was primarily provided by patient's daughter. Recent was in her usual state of health this morning when she had her breakfast and was sitting on a chair, she then started him telling that she was unable to get up. I occurred that she is started saying that she is feeling very sleepy. Her daughter noticed that patient was appearing sleepy and her jaws dropping. She checked her blood sugar and was 166. She denies  having any falls, noticing tongue bite, or any shakes. She also noticed some sliding off speech initially. She tried to get a blood pressure recording but then decided to call the EMS instead. The daughter informs that patient isn't now about 95% back to her baseline. Her speech is completely normal. Patient had similar episodes in the past but lasted only for a few minutes. Patient on questioning says she does not remember the episode and only remembers being here in the hospital.   Physical Exam on Discharge:  Filed Vitals:   03/26/11 2100 03/27/11 0546 03/27/11 0844 03/27/11 1335  BP: 132/72 160/79 143/84 147/78  Pulse: 70 66 63 63  Temp: 98.4 F (36.9 C) 98.1 F (36.7 C) 97.7 F (36.5 C) 97.9 F (36.6 C)  TempSrc: Oral Oral Oral Oral  Resp: 16 16 17 20   Height:      Weight:      SpO2: 93% 95% 97% 96%     Intake/Output Summary (Last 24 hours) at 03/27/11 1622 Last data filed at 03/27/11 1335  Gross per 24 hour  Intake    840 ml  Output      0 ml  Net    840 ml    General: patient lying in  bed in no acute distress. Very pleasant  HEENT: no pallor, pupils reactive b/l, no icterus, moist oral mucosa, no JVD, no carotid bruit  CHEST: clear breath sounds b/l, no added sounds  CVS: N S1 &S2, no murmurs  Abd: soft, NT, ND, BS+  EXT: warm, no edema  CNS: AAOx3, speech normal, pupils reactive b/l , EOMI, normal motor tone, power 4+5 in LLE, reflexes 2+ B/L , normal sensations. Gait not assessed. No cerebellar dysfunction.   CBC:    Component Value Date/Time   WBC 2.8* 03/27/2011 0722   HGB 10.7* 03/27/2011 0722   HCT 33.4* 03/27/2011 0722   PLT 173 03/27/2011 0722   MCV 92.0 03/27/2011 0722   NEUTROABS 1.7 03/25/2011 1331   LYMPHSABS 1.5 03/25/2011 1331   MONOABS 0.3 03/25/2011 1331   EOSABS 0.0 03/25/2011 1331   BASOSABS 0.0 03/25/2011 1331    Basic Metabolic Panel:    Component Value Date/Time   NA 142 03/26/2011 0600   K 3.7 03/26/2011 0600   CL 106 03/26/2011  0600   CO2 29 03/26/2011 0600   BUN 20 03/26/2011 0600   CREATININE 0.77 03/26/2011 0600   GLUCOSE 107* 03/26/2011 0600   CALCIUM 9.4 03/26/2011 0600    Hospital Course:   altered mental status with ? syncope Patient's symptoms resolved in the ED Her physical exam is unremarkable with no new neurological deficit. EKG and cardiac enzymes done in the ED negative.  - Patient admitted to  Telemetry and stable.  Head CT done on admission is negative for acute intracranial abnormality.  Patient seen by stroke team in the ED [consulted by Dr. Lyman Speller , and deemed not a candidate for TPA.  Ordered MRI brain which is negative for ischemia.  -UA neg for UTI  -EEG done  to r/o possible seizure and was negative as well. Her sympts coulsd be related to syncope. She was admitted in [place] this yr for syncopal epidose . Echo done then showed gr 2 diastolic dysfunction, carotid doppler showed non significant stenosis. She also had nuclear stress test in 12/08/10 done by Dr Sharyn Lull which was negative . She could have symptom of syncope related to arrythmia. Her tele monitoring was unremarkable. she should be evaluated withoutpatient Holter monitoring. i have spoken with Dr Rinaldo Cloud ( cardiologist) over the phone today and he agrees with getting outpt holter. patient should call his office to set up an appt within 2 weeks Was unable to reach PCP  hypertension  Stable at this time  Continue home medications.    history of CVA  Continue statin.  Patient was on Plavix at home which was discontinued due to history of GI bleed 6 months back . Cont baby ASA   Anemia  Low iron and iron sat with low normal ferritin from workup in may 2012. MCV was noted to be low at that time as well.   repeat b12 level  and TSH wnl Would recommend starting iron supplements as outpt.  Patient stable to be discharged home with outpt follow up with PCP and cardiology ( Dr Sharyn Lull)    Time spent on Discharge: 40  minutes  Signed: Eddie North 03/27/2011, 4:22 PM

## 2011-03-27 NOTE — Progress Notes (Signed)
Utilization Review Completed.  Elizabeth Pugh T  03/27/2011 

## 2011-03-28 NOTE — Procedures (Signed)
EEG NUMBER:[id number].  HISTORY:  An 77 year old woman who was found unresponsive with slurred speech, facial droop.  MEDICATIONS:  No anticonvulsant medications.  CONDITION OF RECORDING:  This 16-lead EEG was recorded with patient in the awake and drowsy states.    BACKGROUND RHYTHMS:background patterns in wakefulness were well organized with a well-sustained posterior dominant rhythm of 10.5 Hz, symmetrical and reactive to eye opening and closing. Drowsiness was associated with mild change in voltage and slowing frequencies.  Diffuse beta range patterns were noted.   ABNORMAL POTENTIALS: No epileptiform activity or focal slowing was noted.  ACTIVATION PROCEDURES:  Hyperventilation was not performed.  Photic stimulation did not activate tracing.  EKG:  Single channel EKG monitoring detected an irregular rhythm.  IMPRESSION:  This is a normal awake and drowsy EEG.  A normal EEG does not rule out the clinical diagnosis of epilepsy.  If clinically warranted, A repeat extended EEG or ambulatory recording may be obtained for prolonged times and sleep capture, which may increase the diagnostic yield. Single channel EKG  monitor detected an irregular rhythm.  If clinically warranted, clinical correlation is suggested.          ______________________________ Carmell Austria, MD    JX:BJYN D:  03/27/2011 13:04:59  T:  03/27/2011 14:20:38  Job #:  [id number]

## 2011-04-02 LAB — GLUCOSE, CAPILLARY: Glucose-Capillary: 144 mg/dL — ABNORMAL HIGH (ref 70–99)

## 2011-04-12 ENCOUNTER — Emergency Department (HOSPITAL_COMMUNITY): Payer: Medicare Other

## 2011-04-12 ENCOUNTER — Observation Stay (HOSPITAL_COMMUNITY): Payer: Medicare Other

## 2011-04-12 ENCOUNTER — Other Ambulatory Visit: Payer: Self-pay

## 2011-04-12 ENCOUNTER — Encounter (HOSPITAL_COMMUNITY): Payer: Self-pay | Admitting: Family Medicine

## 2011-04-12 ENCOUNTER — Inpatient Hospital Stay (HOSPITAL_COMMUNITY)
Admission: EM | Admit: 2011-04-12 | Discharge: 2011-04-17 | DRG: 101 | Disposition: A | Discharge status: Home Health | Payer: Medicare Other | Source: Ambulatory Visit | Attending: Family Medicine | Admitting: Family Medicine

## 2011-04-12 DIAGNOSIS — IMO0002 Reserved for concepts with insufficient information to code with codable children: Secondary | ICD-10-CM | POA: Diagnosis present

## 2011-04-12 DIAGNOSIS — I69359 Hemiplegia and hemiparesis following cerebral infarction affecting unspecified side: Secondary | ICD-10-CM

## 2011-04-12 DIAGNOSIS — Z825 Family history of asthma and other chronic lower respiratory diseases: Secondary | ICD-10-CM

## 2011-04-12 DIAGNOSIS — R410 Disorientation, unspecified: Secondary | ICD-10-CM

## 2011-04-12 DIAGNOSIS — E119 Type 2 diabetes mellitus without complications: Secondary | ICD-10-CM | POA: Diagnosis present

## 2011-04-12 DIAGNOSIS — Z8249 Family history of ischemic heart disease and other diseases of the circulatory system: Secondary | ICD-10-CM

## 2011-04-12 DIAGNOSIS — R569 Unspecified convulsions: Secondary | ICD-10-CM

## 2011-04-12 DIAGNOSIS — Z79899 Other long term (current) drug therapy: Secondary | ICD-10-CM

## 2011-04-12 DIAGNOSIS — R4789 Other speech disturbances: Secondary | ICD-10-CM | POA: Diagnosis present

## 2011-04-12 DIAGNOSIS — Z87891 Personal history of nicotine dependence: Secondary | ICD-10-CM

## 2011-04-12 DIAGNOSIS — I69959 Hemiplegia and hemiparesis following unspecified cerebrovascular disease affecting unspecified side: Secondary | ICD-10-CM

## 2011-04-12 DIAGNOSIS — Z881 Allergy status to other antibiotic agents status: Secondary | ICD-10-CM

## 2011-04-12 DIAGNOSIS — G2581 Restless legs syndrome: Secondary | ICD-10-CM | POA: Diagnosis present

## 2011-04-12 DIAGNOSIS — F411 Generalized anxiety disorder: Secondary | ICD-10-CM | POA: Diagnosis present

## 2011-04-12 DIAGNOSIS — Z823 Family history of stroke: Secondary | ICD-10-CM

## 2011-04-12 DIAGNOSIS — Z7982 Long term (current) use of aspirin: Secondary | ICD-10-CM

## 2011-04-12 DIAGNOSIS — G40909 Epilepsy, unspecified, not intractable, without status epilepticus: Principal | ICD-10-CM | POA: Diagnosis present

## 2011-04-12 DIAGNOSIS — I10 Essential (primary) hypertension: Secondary | ICD-10-CM | POA: Diagnosis present

## 2011-04-12 DIAGNOSIS — Z880 Allergy status to penicillin: Secondary | ICD-10-CM

## 2011-04-12 DIAGNOSIS — R29810 Facial weakness: Secondary | ICD-10-CM | POA: Diagnosis present

## 2011-04-12 DIAGNOSIS — K219 Gastro-esophageal reflux disease without esophagitis: Secondary | ICD-10-CM | POA: Diagnosis present

## 2011-04-12 DIAGNOSIS — E1165 Type 2 diabetes mellitus with hyperglycemia: Secondary | ICD-10-CM | POA: Diagnosis present

## 2011-04-12 HISTORY — DX: Disorientation, unspecified: R41.0

## 2011-04-12 LAB — DIFFERENTIAL
Basophils Absolute: 0 10*3/uL (ref 0.0–0.1)
Eosinophils Absolute: 0 10*3/uL (ref 0.0–0.7)
Eosinophils Relative: 1 % (ref 0–5)
Monocytes Absolute: 0.4 10*3/uL (ref 0.1–1.0)

## 2011-04-12 LAB — CK TOTAL AND CKMB (NOT AT ARMC)
CK, MB: 2 ng/mL (ref 0.3–4.0)
Relative Index: INVALID (ref 0.0–2.5)

## 2011-04-12 LAB — CBC
HCT: 31.2 % — ABNORMAL LOW (ref 36.0–46.0)
MCH: 28.4 pg (ref 26.0–34.0)
MCHC: 30.8 g/dL (ref 30.0–36.0)
MCV: 92.3 fL (ref 78.0–100.0)
Platelets: 154 10*3/uL (ref 150–400)
RDW: 14.6 % (ref 11.5–15.5)
WBC: 4.4 10*3/uL (ref 4.0–10.5)

## 2011-04-12 LAB — COMPREHENSIVE METABOLIC PANEL
ALT: 6 U/L (ref 0–35)
AST: 11 U/L (ref 0–37)
Calcium: 10.2 mg/dL (ref 8.4–10.5)
Creatinine, Ser: 0.83 mg/dL (ref 0.50–1.10)
GFR calc Af Amer: 74 mL/min — ABNORMAL LOW (ref >90)
GFR calc non Af Amer: 64 mL/min — ABNORMAL LOW (ref >90)
Sodium: 136 mEq/L (ref 135–145)
Total Protein: 6.7 g/dL (ref 6.0–8.3)

## 2011-04-12 LAB — TROPONIN I: Troponin I: <0.3 ng/mL (ref <0.30)

## 2011-04-12 LAB — PROTIME-INR: Prothrombin Time: 14.1 seconds (ref 11.6–15.2)

## 2011-04-12 MED ORDER — AMLODIPINE BESYLATE 10 MG PO TABS
10.0000 mg | ORAL_TABLET | Freq: Every day | ORAL | Status: DC
  Administered 2011-04-13 – 2011-04-17 (×5): 10 mg via ORAL
  Filled 2011-04-12 (×5): qty 1

## 2011-04-12 MED ORDER — ACETAMINOPHEN 325 MG PO TABS
650.0000 mg | ORAL_TABLET | Freq: Three times a day (TID) | ORAL | Status: DC | PRN
  Administered 2011-04-17: 650 mg via ORAL
  Filled 2011-04-12: qty 2

## 2011-04-12 MED ORDER — SENNOSIDES-DOCUSATE SODIUM 8.6-50 MG PO TABS: 1.0000 | ORAL_TABLET | Freq: Every evening | ORAL | Status: DC | PRN

## 2011-04-12 MED ORDER — PANTOPRAZOLE SODIUM 40 MG PO TBEC
40.0000 mg | DELAYED_RELEASE_TABLET | Freq: Every day | ORAL | Status: DC
  Administered 2011-04-13 – 2011-04-17 (×5): 40 mg via ORAL
  Filled 2011-04-12 (×5): qty 1

## 2011-04-12 MED ORDER — PREGABALIN 50 MG PO CAPS
50.0000 mg | ORAL_CAPSULE | Freq: Every day | ORAL | Status: DC
  Administered 2011-04-12 – 2011-04-16 (×5): 50 mg via ORAL
  Filled 2011-04-12 (×5): qty 1

## 2011-04-12 MED ORDER — PIOGLITAZONE HCL 30 MG PO TABS
30.0000 mg | ORAL_TABLET | Freq: Every day | ORAL | Status: DC
  Administered 2011-04-13 – 2011-04-17 (×5): 30 mg via ORAL
  Filled 2011-04-12 (×5): qty 1

## 2011-04-12 MED ORDER — SODIUM CHLORIDE 0.9 % IV SOLN
INTRAVENOUS | Status: DC
  Administered 2011-04-12: 22:00:00 via INTRAVENOUS

## 2011-04-12 MED ORDER — ASPIRIN 325 MG PO TABS
325.0000 mg | ORAL_TABLET | Freq: Every day | ORAL | Status: DC
  Administered 2011-04-12 – 2011-04-17 (×6): 325 mg via ORAL
  Filled 2011-04-12 (×6): qty 1

## 2011-04-12 MED ORDER — ASPIRIN 300 MG RE SUPP
300.0000 mg | Freq: Every day | RECTAL | Status: DC
  Filled 2011-04-12: qty 1

## 2011-04-12 MED ORDER — AMLODIPINE-ATORVASTATIN 10-10 MG PO TABS: 1.0000 | ORAL_TABLET | Freq: Every day | ORAL | Status: DC

## 2011-04-12 MED ORDER — CALCIUM CARBONATE 1250 (500 CA) MG PO TABS
1.0000 | ORAL_TABLET | Freq: Two times a day (BID) | ORAL | Status: DC
  Administered 2011-04-13 – 2011-04-17 (×9): 500 mg via ORAL
  Filled 2011-04-12 (×11): qty 1

## 2011-04-12 MED ORDER — CALCIUM CARBONATE 600 MG PO TABS
600.0000 mg | ORAL_TABLET | Freq: Two times a day (BID) | ORAL | Status: DC
  Filled 2011-04-12: qty 1

## 2011-04-12 MED ORDER — CARVEDILOL PHOSPHATE ER 20 MG PO CP24
20.0000 mg | ORAL_CAPSULE | Freq: Every day | ORAL | Status: DC
  Administered 2011-04-13 – 2011-04-17 (×5): 20 mg via ORAL
  Filled 2011-04-12 (×5): qty 1

## 2011-04-12 MED ORDER — SIMVASTATIN 20 MG PO TABS
20.0000 mg | ORAL_TABLET | Freq: Every day | ORAL | Status: DC
  Administered 2011-04-13 – 2011-04-17 (×5): 20 mg via ORAL
  Filled 2011-04-12 (×5): qty 1

## 2011-04-12 MED ORDER — OLMESARTAN MEDOXOMIL 20 MG PO TABS
20.0000 mg | ORAL_TABLET | Freq: Every day | ORAL | Status: DC
  Administered 2011-04-13 – 2011-04-17 (×5): 20 mg via ORAL
  Filled 2011-04-12 (×5): qty 1

## 2011-04-12 MED ORDER — DOCUSATE SODIUM 100 MG PO CAPS
100.0000 mg | ORAL_CAPSULE | Freq: Two times a day (BID) | ORAL | Status: DC
  Administered 2011-04-12 – 2011-04-17 (×8): 100 mg via ORAL
  Filled 2011-04-12 (×11): qty 1

## 2011-04-12 MED ORDER — ROPINIROLE HCL 1 MG PO TABS
2.0000 mg | ORAL_TABLET | Freq: Every day | ORAL | Status: DC
  Administered 2011-04-12: 2 mg via ORAL
  Filled 2011-04-12 (×2): qty 2

## 2011-04-12 NOTE — ED Notes (Signed)
No sz activity noted.

## 2011-04-12 NOTE — Consult Note (Signed)
Reason for Consult: "confusion, slurred speech, right facial droop"  HPI: Elizabeth Pugh is an 77 y.o. Female who took her Micardis and developed inability to speak with profoundly slurred speech, right facial droop and confusion. Brought in as stroke code. NIHSS of 1. No t-Pa given due to low NIHSS score. It's patient's second similar such episode on record. She came in on 11/21 with similar presentation and her symptoms resolved completely. Her MRI at the time was negative for CVA as was her EEG negative for seizures.   Past Medical History  Diagnosis Date  . Stroke   . GERD (gastroesophageal reflux disease)   . Hypertension   . Diabetes mellitus   . Blood transfusion   . Arthritis   . Anxiety   . Anemia   . GI (gastrointestinal bleed)     Due to benign mass  . Bronchitis   . Restless leg syndrome    Past Surgical History  Procedure Date  . Eye surgery     Cataract removal  . Abdominal hysterectomy   . Tubal ligation    Family History  Problem Relation Age of Onset  . Aneurysm Mother   . Heart attack Father   . Heart failure Sister   . Asthma Sister   . Kidney failure Sister   . Stroke Sister    Social History:  reports that she has quit smoking. Her smoking use included Cigarettes. She has never used smokeless tobacco. She reports that she does not drink alcohol or use illicit drugs.  Allergies:  Allergies  Allergen Reactions  . Penicillins Other (See Comments)    unknown  . Zithromax (Azithromycin) Other (See Comments)    Pt states that it gives her a yeast infection.   Medications: I have reviewed the patient's current medications.  ROS: as above  Blood pressure 141/57, pulse 74, temperature 98.8 F (37.1 C), temperature source Oral, resp. rate 16, SpO2 100.00%.  Neurological exam: AAO*2. No aphasia. Followed complex commands. Cranial nerves: EOMI, PERRL. Visual fields were full. Sensation to V1 through V3 areas of the face was intact and symmetric  throughout. There was no facial asymmetry. Hearing to finger rub was equal and symmetrical bilaterally. Shoulder shrug was 5/5 and symmetric bilaterally. Head rotation was 5/5 bilaterally. There was mild to moderate dysarthria. Motor: strength was 5/5 and symmetric throughout. Sensory: was intact throughout to light touch, pinprick. Coordination: finger-to-nose were intact and symmetric bilaterally. Reflexes: were 2+ in upper extremities and 1+ at the knees and 1+ at the ankles. Plantar response was downgoing bilaterally. Gait: deferred  Results for orders placed during the hospital encounter of 04/12/11 (from the past 48 hour(s))  PROTIME-INR     Status: Normal   Collection Time   04/12/11  5:38 PM      Component Value Range Comment   Prothrombin Time 14.1  11.6 - 15.2 (seconds)    INR 1.07  0.00 - 1.49    APTT     Status: Normal   Collection Time   04/12/11  5:38 PM      Component Value Range Comment   aPTT 35  24 - 37 (seconds)   CBC     Status: Abnormal   Collection Time   04/12/11  5:38 PM      Component Value Range Comment   WBC 4.4  4.0 - 10.5 (K/uL)    RBC 3.38 (*) 3.87 - 5.11 (MIL/uL)    Hemoglobin 9.6 (*) 12.0 - 15.0 (g/dL)  HCT 31.2 (*) 36.0 - 46.0 (%)    MCV 92.3  78.0 - 100.0 (fL)    MCH 28.4  26.0 - 34.0 (pg)    MCHC 30.8  30.0 - 36.0 (g/dL)    RDW 40.3  47.4 - 25.9 (%)    Platelets 154  150 - 400 (K/uL)   DIFFERENTIAL     Status: Normal   Collection Time   04/12/11  5:38 PM      Component Value Range Comment   Neutrophils Relative 60  43 - 77 (%)    Neutro Abs 2.7  1.7 - 7.7 (K/uL)    Lymphocytes Relative 30  12 - 46 (%)    Lymphs Abs 1.3  0.7 - 4.0 (K/uL)    Monocytes Relative 9  3 - 12 (%)    Monocytes Absolute 0.4  0.1 - 1.0 (K/uL)    Eosinophils Relative 1  0 - 5 (%)    Eosinophils Absolute 0.0  0.0 - 0.7 (K/uL)    Basophils Relative 0  0 - 1 (%)    Basophils Absolute 0.0  0.0 - 0.1 (K/uL)   COMPREHENSIVE METABOLIC PANEL     Status: Abnormal   Collection  Time   04/12/11  5:38 PM      Component Value Range Comment   Sodium 136  135 - 145 (mEq/L)    Potassium 3.9  3.5 - 5.1 (mEq/L)    Chloride 103  96 - 112 (mEq/L)    CO2 25  19 - 32 (mEq/L)    Glucose, Bld 160 (*) 70 - 99 (mg/dL)    BUN 24 (*) 6 - 23 (mg/dL)    Creatinine, Ser 5.63  0.50 - 1.10 (mg/dL)    Calcium 87.5  8.4 - 10.5 (mg/dL)    Total Protein 6.7  6.0 - 8.3 (g/dL)    Albumin 3.7  3.5 - 5.2 (g/dL)    AST 11  0 - 37 (U/L)    ALT 6  0 - 35 (U/L)    Alkaline Phosphatase 58  39 - 117 (U/L)    Total Bilirubin 0.8  0.3 - 1.2 (mg/dL)    GFR calc non Af Amer 64 (*) >90 (mL/min)    GFR calc Af Amer 74 (*) >90 (mL/min)   CK TOTAL AND CKMB     Status: Normal   Collection Time   04/12/11  5:39 PM      Component Value Range Comment   Total CK 47  7 - 177 (U/L)    CK, MB 2.0  0.3 - 4.0 (ng/mL)    Relative Index RELATIVE INDEX IS INVALID  0.0 - 2.5    TROPONIN I     Status: Normal   Collection Time   04/12/11  5:39 PM      Component Value Range Comment   Troponin I <0.30  <0.30 (ng/mL)    Ct Head Wo Contrast  04/12/2011  *RADIOLOGY REPORT*  Clinical Data: Code stroke.  Confusion with slurred speech.  CT HEAD WITHOUT CONTRAST  Technique:  Contiguous axial images were obtained from the base of the skull through the vertex without contrast.  Comparison: 03/25/2011  Findings: There is no evidence for acute hemorrhage, hydrocephalus, mass lesion, or abnormal extra-axial fluid collection.  No definite CT evidence for acute infarction.  Diffuse loss of parenchymal volume is consistent with atrophy. Patchy low attenuation in the deep hemispheric and periventricular white matter is nonspecific, but likely reflects chronic microvascular ischemic demyelination. Lacunar  infarcts are seen in the basal ganglia bilaterally, as before.  The visualized paranasal sinuses and mastoid air cells are clear.  IMPRESSION: Stable.  Atrophy with chronic small vessel white matter disease and bilateral lacunar  infarctions within the basal ganglia.  No acute findings.  I personally called these results to Dr. Anitra Lauth in the emergency department at 1750 hours on 04/12/2011.  Original Report Authenticated By: ERIC A. MANSELL, M.D.   Assessment/Plan: 77 years old woman with another episode of confusion, slurred speech and right facial droop and aphasia - with almost complete resolution 1) Telemetry, Echo, CD 2) EEG - will consider treating for seizures given stereotypical presentation even if normal 3) Switch to ASA 325 mg PO daily 4) Neurochecks q4h 5) NPO until nursing swallow evaluation 6) Hold. BP meds - IF MRI brain is normal, can resume 7) Will follow  Pebbles Zeiders 04/12/2011, 6:28 PM

## 2011-04-12 NOTE — ED Provider Notes (Addendum)
History     CSN: [id number] Arrival date & time: 04/12/2011  5:33 PM   First MD Initiated Contact with Patient 04/12/11 1736      No chief complaint on file.   (Consider location/radiation/quality/duration/timing/severity/associated sxs/prior treatment) Patient is a 77 y.o. female presenting with neurologic complaint.  Neurologic Problem Primary symptoms comment: Sign onset of speech difficulty and right-sided facial droop The symptoms began less than 1 hour ago. The episode lasted 30 minutes. The symptoms are improving. The neurological symptoms are focal. Context: While she was sitting eating lunch with family.  Additional symptoms do not include weakness, pain, leg pain, loss of balance, photophobia or tinnitus. Workup history includes MRI.    Past Medical History  Diagnosis Date  . Stroke   . GERD (gastroesophageal reflux disease)   . Hypertension   . Diabetes mellitus   . Blood transfusion   . Arthritis   . Anxiety   . Anemia   . GI (gastrointestinal bleed)     Due to benign mass  . Bronchitis   . Restless leg syndrome     Past Surgical History  Procedure Date  . Eye surgery     Cataract removal  . Abdominal hysterectomy   . Tubal ligation     Family History  Problem Relation Age of Onset  . Aneurysm Mother   . Heart attack Father   . Heart failure Sister   . Asthma Sister   . Kidney failure Sister   . Stroke Sister     History  Substance Use Topics  . Smoking status: Former Smoker    Types: Cigarettes  . Smokeless tobacco: Never Used  . Alcohol Use: No    OB History    Grav Para Term Preterm Abortions TAB SAB Ect Mult Living                  Review of Systems  HENT: Negative for tinnitus.   Eyes: Negative for photophobia.  Neurological: Negative for weakness and loss of balance.  All other systems reviewed and are negative.    Allergies  Penicillins and Zithromax  Home Medications   Current Outpatient Rx  Name Route Sig Dispense  Refill  . AMLODIPINE-ATORVASTATIN 10-10 MG PO TABS Oral Take 1 tablet by mouth daily.      . ASPIRIN EC 81 MG PO TBEC Oral Take 81 mg by mouth daily.      Marland Kitchen CALCIUM CARBONATE 600 MG PO TABS Oral Take 600 mg by mouth 2 (two) times daily with a meal.      . CARVEDILOL PHOSPHATE ER 20 MG PO CP24 Oral Take 20 mg by mouth daily.      Marland Kitchen ESOMEPRAZOLE MAGNESIUM 40 MG PO CPDR Oral Take 40 mg by mouth daily before breakfast.      . METFORMIN HCL ER 750 MG PO TB24 Oral Take 750 mg by mouth 2 (two) times daily.      Marland Kitchen PIOGLITAZONE HCL 30 MG PO TABS Oral Take 30 mg by mouth daily.      Marland Kitchen PREGABALIN 50 MG PO CAPS Oral Take 50 mg by mouth at bedtime.      Marland Kitchen ROPINIROLE HCL 1 MG PO TABS Oral Take 2 mg by mouth 4 (four) times daily.      . TELMISARTAN 40 MG PO TABS Oral Take 40 mg by mouth daily.        There were no vitals taken for this visit.  Physical Exam  Nursing note and  vitals reviewed. Constitutional: She is oriented to person, place, and time. She appears well-developed and well-nourished. She appears distressed.  HENT:  Head: Normocephalic and atraumatic.  Eyes: EOM are normal. Pupils are equal, round, and reactive to light.  Neck: Normal range of motion. Neck supple.  Cardiovascular: Normal rate, regular rhythm, normal heart sounds and intact distal pulses.  Exam reveals no friction rub.   No murmur heard. Pulmonary/Chest: Effort normal and breath sounds normal. She has no wheezes. She has no rales.  Abdominal: Soft. Bowel sounds are normal. She exhibits no distension. There is no tenderness. There is no rebound and no guarding.  Musculoskeletal: Normal range of motion. She exhibits no tenderness.       No edema.  Expressive aphasia  Neurological: She is alert and oriented to person, place, and time. No cranial nerve deficit. Coordination normal.       Mild slurred speech. No facial droop, normal grip strength  Skin: Skin is warm and dry. No rash noted.  Psychiatric: She has a normal mood  and affect. Her behavior is normal.    ED Course  Procedures (including critical care time)  Results for orders placed during the hospital encounter of 04/12/11  PROTIME-INR      Component Value Range   Prothrombin Time 14.1  11.6 - 15.2 (seconds)   INR 1.07  0.00 - 1.49   APTT      Component Value Range   aPTT 35  24 - 37 (seconds)  CBC      Component Value Range   WBC 4.4  4.0 - 10.5 (K/uL)   RBC 3.38 (*) 3.87 - 5.11 (MIL/uL)   Hemoglobin 9.6 (*) 12.0 - 15.0 (g/dL)   HCT 16.1 (*) 09.6 - 46.0 (%)   MCV 92.3  78.0 - 100.0 (fL)   MCH 28.4  26.0 - 34.0 (pg)   MCHC 30.8  30.0 - 36.0 (g/dL)   RDW 04.5  40.9 - 81.1 (%)   Platelets 154  150 - 400 (K/uL)  DIFFERENTIAL      Component Value Range   Neutrophils Relative 60  43 - 77 (%)   Neutro Abs 2.7  1.7 - 7.7 (K/uL)   Lymphocytes Relative 30  12 - 46 (%)   Lymphs Abs 1.3  0.7 - 4.0 (K/uL)   Monocytes Relative 9  3 - 12 (%)   Monocytes Absolute 0.4  0.1 - 1.0 (K/uL)   Eosinophils Relative 1  0 - 5 (%)   Eosinophils Absolute 0.0  0.0 - 0.7 (K/uL)   Basophils Relative 0  0 - 1 (%)   Basophils Absolute 0.0  0.0 - 0.1 (K/uL)  COMPREHENSIVE METABOLIC PANEL      Component Value Range   Sodium 136  135 - 145 (mEq/L)   Potassium 3.9  3.5 - 5.1 (mEq/L)   Chloride 103  96 - 112 (mEq/L)   CO2 25  19 - 32 (mEq/L)   Glucose, Bld 160 (*) 70 - 99 (mg/dL)   BUN 24 (*) 6 - 23 (mg/dL)   Creatinine, Ser 9.14  0.50 - 1.10 (mg/dL)   Calcium 78.2  8.4 - 10.5 (mg/dL)   Total Protein 6.7  6.0 - 8.3 (g/dL)   Albumin 3.7  3.5 - 5.2 (g/dL)   AST 11  0 - 37 (U/L)   ALT 6  0 - 35 (U/L)   Alkaline Phosphatase 58  39 - 117 (U/L)   Total Bilirubin 0.8  0.3 - 1.2 (mg/dL)  GFR calc non Af Amer 64 (*) >90 (mL/min)   GFR calc Af Amer 74 (*) >90 (mL/min)  CK TOTAL AND CKMB      Component Value Range   Total CK 47  7 - 177 (U/L)   CK, MB 2.0  0.3 - 4.0 (ng/mL)   Relative Index RELATIVE INDEX IS INVALID  0.0 - 2.5   TROPONIN I      Component Value  Range   Troponin I <0.30  <0.30 (ng/mL)   Ct Head Wo Contrast  04/12/2011  *RADIOLOGY REPORT*  Clinical Data: Code stroke.  Confusion with slurred speech.  CT HEAD WITHOUT CONTRAST  Technique:  Contiguous axial images were obtained from the base of the skull through the vertex without contrast.  Comparison: 03/25/2011  Findings: There is no evidence for acute hemorrhage, hydrocephalus, mass lesion, or abnormal extra-axial fluid collection.  No definite CT evidence for acute infarction.  Diffuse loss of parenchymal volume is consistent with atrophy. Patchy low attenuation in the deep hemispheric and periventricular white matter is nonspecific, but likely reflects chronic microvascular ischemic demyelination. Lacunar infarcts are seen in the basal ganglia bilaterally, as before.  The visualized paranasal sinuses and mastoid air cells are clear.  IMPRESSION: Stable.  Atrophy with chronic small vessel white matter disease and bilateral lacunar infarctions within the basal ganglia.  No acute findings.  I personally called these results to Dr. Anitra Lauth in the emergency department at 1750 hours on 04/12/2011.  Original Report Authenticated By: ERIC A. MANSELL, M.D.   Ct Head Wo Contrast  03/25/2011  *RADIOLOGY REPORT*  Clinical Data: Code stroke  CT HEAD WITHOUT CONTRAST  Technique:  Contiguous axial images were obtained from the base of the skull through the vertex without contrast.  Comparison: 06/21/2006  Findings: Motion degraded images.  No evidence of parenchymal hemorrhage or extra-axial fluid collection. No mass lesion, mass effect, or midline shift.  No CT evidence of acute infarction.  Subcortical white matter and periventricular small vessel ischemic changes.  Bilateral basal ganglia lacunar infarcts.  Age related atrophy.  The visualized paranasal sinuses are essentially clear. The mastoid air cells are unopacified.  No evidence of calvarial fracture.  IMPRESSION: Motion degraded images.  No evidence of  acute intracranial abnormality.  Age related atrophy with small vessel ischemic changes.  Original Report Authenticated By: Charline Bills, M.D.   Mr Brain Wo Contrast  03/26/2011  *RADIOLOGY REPORT*  Clinical Data: 76 year old female with lower extremity weakness, possible stroke.  MRI HEAD WITHOUT CONTRAST  Technique:  Multiplanar, multiecho pulse sequences of the brain and surrounding structures were obtained according to standard protocol without intravenous contrast.  Comparison: Head CTs 03/25/2011 and earlier.  Findings: Study is intermittently degraded by motion artifact despite repeated imaging attempts.  Stable cerebral volume. No restricted diffusion to suggest acute infarction.  No midline shift, mass effect, evidence of mass lesion, ventriculomegaly, extra-axial collection or acute intracranial hemorrhage.  Cervicomedullary junction and pituitary are within normal limits.  Major intracranial vascular flow voids are preserved; intracranial artery dolichoectasia is noted.  Multi focal chronic lacunar infarcts in the deep gray matter nuclei and brain stem.  Dilated perivascular spaces in the cerebral hemispheres.  Degenerative changes in the upper cervical spine not well evaluated due to motion.  Decreased T1 marrow signal in the central clivus appears to relate to an area of chronic osteopenia.  This probably is not changed since 2008 and is suboptimally characterized today due to motion. Bone marrow signal elsewhere is within  normal limits.  Right maxillary sinus mucous retention cyst.  Postoperative changes to the globes.  Grossly negative scalp soft tissues.  IMPRESSION: 1.  Degraded by motion despite repeated imaging attempts. 2. No acute intracranial abnormality.  Chronic small vessel ischemia. 3.  Marrow heterogeneity in the clivus appears to be chronic and benign.  Original Report Authenticated By: Harley Hallmark, M.D.   Dg Chest Portable 1 View  03/25/2011  *RADIOLOGY REPORT*  Clinical  Data: Confusion, altered mental status, weakness, code stroke  PORTABLE CHEST - 1 VIEW  Comparison: Portable exam 1359 hours compared to 09/18/2010  Findings: Enlargement of cardiac silhouette. Prominence of central pulmonary arteries and hila unchanged. Tortuous aorta. Pulmonary vascular congestion. No gross infiltrate, pleural effusion, or pneumothorax. Bones demineralized.  IMPRESSION: Enlargement of cardiac silhouette with prominent central pulmonary arteries, question pulmonary arterial hypertension. No definite acute infiltrate.  Original Report Authenticated By: Lollie Marrow, M.D.    Date: 04/12/2011  Rate: 74  Rhythm: normal sinus rhythm  QRS Axis: normal  Intervals: normal  ST/T Wave abnormalities: normal  Conduction Disutrbances:none  Narrative Interpretation:   Old EKG Reviewed: unchanged      MDM   Patient arrived is accentuated due to sudden onset of slurred speech and right-sided facial droop. Patient was seen 11/21 similar complaints however at that time she had altered mental status and was not deemed a code stroke and was admitted for evaluation. On arrival here patient's symptoms were almost completely resolved. She states her speech is almost back to baseline with just some mild slurring now. However patient also does not have her teeth. There is new right-sided upper lower extremity deficits. Patient denies any chest pain or shortness of breath.  Neurology evaluated the patient at that time the only seen on exam was mild expressive aphasia. Patient with similar symptoms and had negative stroke workup. Neurology feels that may be seizures. They request that she be admitted for another EEG and further evaluation. All of her lab tests are within normal limits this was discussed with the patient and her family and we will admit to triad.  Gwyneth Sprout, MD 04/12/11 1924  Gwyneth Sprout, MD 04/12/11 1925

## 2011-04-12 NOTE — ED Notes (Signed)
Per EMS, pt was sitting with family and eating when he had sudden onset of right side facial weakness, slurred speech, and confusion.

## 2011-04-12 NOTE — ED Notes (Signed)
I gave the patient a pack of nabs, a cup of ice, and a diet coke.

## 2011-04-12 NOTE — Progress Notes (Signed)
MD was called about orders concerning patients aspirin and Ct of head. Both orders were discontinued by provider. Ct of head was a duplicate and aspirin PO is to be given instead of supp due to patient passing bed side swallow evaluation. Will continue to monitor. Elizabeth Pugh

## 2011-04-12 NOTE — ED Notes (Signed)
Per Dr. Anitra Lauth MRI can wait until the am. MRI aware

## 2011-04-12 NOTE — ED Notes (Signed)
Pt alert, interactive, calm, skin W&D, resps e/u, NAD, speaking in clear complete sentences, daughter at [facility] reports pt as better than on arrival and near baseline, pt describes self as normal for her, preparing to call report, pt given snack per request, swallow screen previously passed. (denies: nausea, pain, sob, dizziness, numbness, tingling or other complaints).

## 2011-04-12 NOTE — H&P (Signed)
Elizabeth Pugh is an 77 y.o. female.  PCP is Dr. Katheran Awe.  Chief Complaint: Brief episode of confusion slurred speech and right facial droop. HPI: 77 year-old female with previous history of CVA which left with left hemiparesis, who was in the hospital on November 21 last month for similar complaints and at that time MRI of the brain and EEG was negative for anything acute. Today around 5:30 PM at home patient had a brief episode when patient got confused, slurred speech and family felt patient may have had right facial droop. Patient did not lose function of her upper or lower extremity. Patient does have history of previous CVA which left with mild left-sided hemiparesis. EMS was called patient was brought the ER with a code stroke. Dr. Carlean Jews of neurology evaluated the patient and felt patient is not a candidate for TPA as her symptoms have resolved completely. Patient's daughter states her symptoms just lasted for 15 minutes. At this time patient is completely alert awake and oriented to time place and person. The neurologist and this time feels patient may need antiseizure medications. Patient did have one episode of vomiting. Denies any abdominal pain, chest pain, shortness of breath. Patient usually walks with help of walker.  Past Medical History  Diagnosis Date  . Stroke   . GERD (gastroesophageal reflux disease)   . Hypertension   . Diabetes mellitus   . Blood transfusion   . Arthritis   . Anxiety   . Anemia   . GI (gastrointestinal bleed)     Due to benign mass  . Bronchitis   . Restless leg syndrome     Past Surgical History  Procedure Date  . Eye surgery     Cataract removal  . Abdominal hysterectomy   . Tubal ligation     Family History  Problem Relation Age of Onset  . Aneurysm Mother   . Heart attack Father   . Heart failure Sister   . Asthma Sister   . Kidney failure Sister   . Stroke Sister    Social History:  reports that she has quit smoking. Her smoking  use included Cigarettes. She has never used smokeless tobacco. She reports that she does not drink alcohol or use illicit drugs.  Allergies:  Allergies  Allergen Reactions  . Penicillins Other (See Comments)    unknown  . Zithromax (Azithromycin) Other (See Comments)    Pt states that it gives her a yeast infection.    No current facility-administered medications on file as of 04/12/2011.   Medications Prior to Admission  Medication Sig Dispense Refill  . amlodipine-atorvastatin (CADUET) 10-10 MG per tablet Take 1 tablet by mouth daily.        Marland Kitchen aspirin EC 81 MG tablet Take 81 mg by mouth daily.        . calcium carbonate (OS-CAL) 600 MG TABS Take 600 mg by mouth 2 (two) times daily with a meal.        . carvedilol (COREG CR) 20 MG 24 hr capsule Take 20 mg by mouth daily.        Marland Kitchen esomeprazole (NEXIUM) 40 MG capsule Take 40 mg by mouth daily before breakfast.        . metFORMIN (GLUCOPHAGE-XR) 750 MG 24 hr tablet Take 750 mg by mouth 2 (two) times daily.        . pioglitazone (ACTOS) 30 MG tablet Take 30 mg by mouth daily.        . pregabalin (  LYRICA) 50 MG capsule Take 50 mg by mouth at bedtime.        Marland Kitchen telmisartan (MICARDIS) 40 MG tablet Take 40 mg by mouth daily.         Results for orders placed during the hospital encounter of 04/12/11 (from the past 48 hour(s))  PROTIME-INR     Status: Normal   Collection Time   04/12/11  5:38 PM      Component Value Range Comment   Prothrombin Time 14.1  11.6 - 15.2 (seconds)    INR 1.07  0.00 - 1.49    APTT     Status: Normal   Collection Time   04/12/11  5:38 PM      Component Value Range Comment   aPTT 35  24 - 37 (seconds)   CBC     Status: Abnormal   Collection Time   04/12/11  5:38 PM      Component Value Range Comment   WBC 4.4  4.0 - 10.5 (K/uL)    RBC 3.38 (*) 3.87 - 5.11 (MIL/uL)    Hemoglobin 9.6 (*) 12.0 - 15.0 (g/dL)    HCT 16.1 (*) 09.6 - 46.0 (%)    MCV 92.3  78.0 - 100.0 (fL)    MCH 28.4  26.0 - 34.0 (pg)    MCHC  30.8  30.0 - 36.0 (g/dL)    RDW 04.5  40.9 - 81.1 (%)    Platelets 154  150 - 400 (K/uL)   DIFFERENTIAL     Status: Normal   Collection Time   04/12/11  5:38 PM      Component Value Range Comment   Neutrophils Relative 60  43 - 77 (%)    Neutro Abs 2.7  1.7 - 7.7 (K/uL)    Lymphocytes Relative 30  12 - 46 (%)    Lymphs Abs 1.3  0.7 - 4.0 (K/uL)    Monocytes Relative 9  3 - 12 (%)    Monocytes Absolute 0.4  0.1 - 1.0 (K/uL)    Eosinophils Relative 1  0 - 5 (%)    Eosinophils Absolute 0.0  0.0 - 0.7 (K/uL)    Basophils Relative 0  0 - 1 (%)    Basophils Absolute 0.0  0.0 - 0.1 (K/uL)   COMPREHENSIVE METABOLIC PANEL     Status: Abnormal   Collection Time   04/12/11  5:38 PM      Component Value Range Comment   Sodium 136  135 - 145 (mEq/L)    Potassium 3.9  3.5 - 5.1 (mEq/L)    Chloride 103  96 - 112 (mEq/L)    CO2 25  19 - 32 (mEq/L)    Glucose, Bld 160 (*) 70 - 99 (mg/dL)    BUN 24 (*) 6 - 23 (mg/dL)    Creatinine, Ser 9.14  0.50 - 1.10 (mg/dL)    Calcium 78.2  8.4 - 10.5 (mg/dL)    Total Protein 6.7  6.0 - 8.3 (g/dL)    Albumin 3.7  3.5 - 5.2 (g/dL)    AST 11  0 - 37 (U/L)    ALT 6  0 - 35 (U/L)    Alkaline Phosphatase 58  39 - 117 (U/L)    Total Bilirubin 0.8  0.3 - 1.2 (mg/dL)    GFR calc non Af Amer 64 (*) >90 (mL/min)    GFR calc Af Amer 74 (*) >90 (mL/min)   CK TOTAL AND CKMB     Status:  Normal   Collection Time   04/12/11  5:39 PM      Component Value Range Comment   Total CK 47  7 - 177 (U/L)    CK, MB 2.0  0.3 - 4.0 (ng/mL)    Relative Index RELATIVE INDEX IS INVALID  0.0 - 2.5    TROPONIN I     Status: Normal   Collection Time   04/12/11  5:39 PM      Component Value Range Comment   Troponin I <0.30  <0.30 (ng/mL)    Ct Head Wo Contrast  04/12/2011  *RADIOLOGY REPORT*  Clinical Data: Code stroke.  Confusion with slurred speech.  CT HEAD WITHOUT CONTRAST  Technique:  Contiguous axial images were obtained from the base of the skull through the vertex without  contrast.  Comparison: 03/25/2011  Findings: There is no evidence for acute hemorrhage, hydrocephalus, mass lesion, or abnormal extra-axial fluid collection.  No definite CT evidence for acute infarction.  Diffuse loss of parenchymal volume is consistent with atrophy. Patchy low attenuation in the deep hemispheric and periventricular white matter is nonspecific, but likely reflects chronic microvascular ischemic demyelination. Lacunar infarcts are seen in the basal ganglia bilaterally, as before.  The visualized paranasal sinuses and mastoid air cells are clear.  IMPRESSION: Stable.  Atrophy with chronic small vessel white matter disease and bilateral lacunar infarctions within the basal ganglia.  No acute findings.  I personally called these results to Dr. Anitra Lauth in the emergency department at 1750 hours on 04/12/2011.  Original Report Authenticated By: ERIC A. MANSELL, M.D.    Review of Systems  HENT: Negative.   Eyes: Negative.   Respiratory: Negative.   Cardiovascular: Negative.   Gastrointestinal: Positive for vomiting.  Genitourinary: Negative.   Musculoskeletal: Negative.   Skin: Negative.   Neurological: Positive for speech change.       Brief episode of confusion and right facial droop.  Endo/Heme/Allergies: Negative.   Psychiatric/Behavioral: Negative.     Blood pressure 112/53, pulse 70, temperature 98.3 F (36.8 C), temperature source Oral, resp. rate 18, SpO2 100.00%. Physical Exam  Constitutional: She is oriented to person, place, and time. She appears well-developed and well-nourished. No distress.  HENT:  Head: Normocephalic and atraumatic.  Right Ear: External ear normal.  Nose: Nose normal.  Mouth/Throat: Oropharynx is clear and moist. No oropharyngeal exudate.  Eyes: Conjunctivae and EOM are normal. Pupils are equal, round, and reactive to light. Right eye exhibits no discharge. Left eye exhibits no discharge. No scleral icterus.  Neck: Normal range of motion. Neck  supple. No JVD present.  Cardiovascular: Normal rate, regular rhythm, normal heart sounds and intact distal pulses.   Respiratory: Effort normal and breath sounds normal. No stridor. No respiratory distress. She has no wheezes. She has no rales.  GI: Soft. Bowel sounds are normal. She exhibits no distension. There is no tenderness.  Neurological: She is alert and oriented to person, place, and time. She has normal reflexes. No cranial nerve deficit. Coordination normal.       Mild left upper extremity weakness and 4/5 in the left lower extremity. Right upper and lower extremity is 5/5. Tongue is midline and no obvious facial asymmetry.  Skin: Skin is warm. No rash noted. She is not diaphoretic. No erythema.  Psychiatric: Her behavior is normal.     Assessment/Plan #1. Brief episode of confusion with slurred speech and right facial droop which is completely resolved to rule out TIA versus a seizure. #2. Recent admission  last month for similar complaints and at that time MRI brain and EEG were negative for anything acute. #3. Previous history of CVA which left with mild left-sided hemiparesis. #4. History of GI bleed with anemia. #5. History of hypertension. #6. History of diabetes mellitus type 2.  Plan Admit patient to telemetry We'll initiate stroke workup including MRI MRA brain carotid Doppler and 2-D echo. Neurologist feels patient may have had seizure for which we are ordering EEG. Patient does have anemia we need to closely follow CBC. For her diabetes patient will be continued her present medications except metformin. Further recommendations based on tests ordered and neurology recommendations.  Eduard Clos 04/12/2011, 8:34 PM

## 2011-04-13 ENCOUNTER — Observation Stay (HOSPITAL_COMMUNITY): Payer: Medicare Other

## 2011-04-13 ENCOUNTER — Other Ambulatory Visit (HOSPITAL_COMMUNITY): Payer: Self-pay

## 2011-04-13 DIAGNOSIS — I6350 Cerebral infarction due to unspecified occlusion or stenosis of unspecified cerebral artery: Secondary | ICD-10-CM

## 2011-04-13 LAB — COMPREHENSIVE METABOLIC PANEL
ALT: 5 U/L (ref 0–35)
CO2: 28 mEq/L (ref 19–32)
Calcium: 9.5 mg/dL (ref 8.4–10.5)
Creatinine, Ser: 0.76 mg/dL (ref 0.50–1.10)
GFR calc Af Amer: 89 mL/min — ABNORMAL LOW (ref >90)
GFR calc non Af Amer: 76 mL/min — ABNORMAL LOW (ref >90)
Glucose, Bld: 135 mg/dL — ABNORMAL HIGH (ref 70–99)
Sodium: 142 mEq/L (ref 135–145)
Total Protein: 6.1 g/dL (ref 6.0–8.3)

## 2011-04-13 LAB — LIPID PANEL
LDL Cholesterol: 62 mg/dL (ref 0–99)
VLDL: 11 mg/dL (ref 0–40)

## 2011-04-13 LAB — CBC
HCT: 30.8 % — ABNORMAL LOW (ref 36.0–46.0)
Hemoglobin: 9.8 g/dL — ABNORMAL LOW (ref 12.0–15.0)
MCHC: 31.8 g/dL (ref 30.0–36.0)
RBC: 3.33 MIL/uL — ABNORMAL LOW (ref 3.87–5.11)
WBC: 3 10*3/uL — ABNORMAL LOW (ref 4.0–10.5)

## 2011-04-13 LAB — MAGNESIUM: Magnesium: 1.6 mg/dL (ref 1.5–2.5)

## 2011-04-13 LAB — GLUCOSE, CAPILLARY
Glucose-Capillary: 119 mg/dL — ABNORMAL HIGH (ref 70–99)
Glucose-Capillary: 186 mg/dL — ABNORMAL HIGH (ref 70–99)
Glucose-Capillary: 123 mg/dL — ABNORMAL HIGH (ref 70–99)

## 2011-04-13 LAB — TSH: TSH: 0.641 u[IU]/mL (ref 0.350–4.500)

## 2011-04-13 MED ORDER — MAGNESIUM SULFATE 40 MG/ML IJ SOLN
2.0000 g | Freq: Once | INTRAMUSCULAR | Status: AC
  Administered 2011-04-13: 2 g via INTRAVENOUS
  Filled 2011-04-13: qty 50

## 2011-04-13 MED ORDER — ROPINIROLE HCL 1 MG PO TABS
2.0000 mg | ORAL_TABLET | Freq: Two times a day (BID) | ORAL | Status: DC
  Administered 2011-04-13 – 2011-04-17 (×9): 2 mg via ORAL
  Filled 2011-04-13 (×9): qty 2

## 2011-04-13 MED ORDER — INSULIN ASPART 100 UNIT/ML ~~LOC~~ SOLN
0.0000 [IU] | Freq: Three times a day (TID) | SUBCUTANEOUS | Status: DC
  Administered 2011-04-13: 1 [IU] via SUBCUTANEOUS
  Administered 2011-04-13: 2 [IU] via SUBCUTANEOUS
  Administered 2011-04-14: 3 [IU] via SUBCUTANEOUS
  Administered 2011-04-14 – 2011-04-15 (×3): 2 [IU] via SUBCUTANEOUS
  Administered 2011-04-15: 3 [IU] via SUBCUTANEOUS
  Administered 2011-04-16 – 2011-04-17 (×5): 2 [IU] via SUBCUTANEOUS
  Filled 2011-04-13: qty 3

## 2011-04-13 NOTE — Progress Notes (Signed)
Patient is upset that she states that she keeps having a wet bed but does not realize that she is having incontinent episodes, became extremely upset when staff tries to explain to her what is happening. Will continue to monitor. Madilyn Fireman [place]

## 2011-04-13 NOTE — Progress Notes (Signed)
Speech Language/Pathology Speech Language Pathology Evaluation Patient Details Name: Elizabeth Pugh MRN: [id number] DOB: Mar 11, 1927 Today's Date: 04/13/2011  Problem List:  Patient Active Problem List  Diagnoses  . Diabetes mellitus  . CVA, old, hemiparesis  . Hypertension  . Altered mental status    Past Medical History:  Past Medical History  Diagnosis Date  . Stroke   . GERD (gastroesophageal reflux disease)   . Hypertension   . Diabetes mellitus   . Blood transfusion   . Arthritis   . Anxiety   . Anemia   . GI (gastrointestinal bleed)     Due to benign mass  . Bronchitis   . Restless leg syndrome    Past Surgical History:  Past Surgical History  Procedure Date  . Eye surgery     Cataract removal  . Abdominal hysterectomy   . Tubal ligation     SLP Assessment/Plan/Recommendation Clinical Impression Statement: Speech/language and cognition are within functional limits for returning home with daughter. Speech is intelligible, problem solving and saftey awareness appeat intact and at baseline. SLP Recommendation/Assessment: Patent does not need any further Speech Lanaguage Pathology Services No Skilled Speech Therapy: Patient is modified independent with all cognitive/linguistic skills Recommendation Recommendations for Other Services: OT consult;PT consult Follow up Recommendations: None Equipment Recommended: None recommended by SLP Individuals Consulted Consulted and Agree with Results and Recommendations: Patient     Prior Functioning  Prior Functional Status Cognitive/Linguistic Baseline: Baseline deficits (Prior CVA, lives with daughter ) Type of Home: House Lives With: Daughter Receives Help From: Family Education: unknown Vocation: Retired  Engineer, building services, Radene Journey 04/13/2011, 12:55 PM

## 2011-04-13 NOTE — Progress Notes (Signed)
Subjective: Pt is sitting up in chair while we talk and seems aware of what is going on. Explained about the work up we are doing for her slurred speech episode. She verbalized understanding and agreement with the plan. No repeat episodes of loss of consciousness or slurred speech. No weakness, tingling, chest pain, abdominal pain, SOB. Is having urinary incontinence per nurse however pt is unaware. Objective: Vital signs in last 24 hours: Filed Vitals:   04/13/11 0235 04/13/11 0401 04/13/11 0554 04/13/11 0830  BP: 126/63 135/62 143/61   Pulse: 63 65 83   Temp: 98.3 F (36.8 C) 97.6 F (36.4 C) 98.6 F (37 C)   TempSrc: Oral Oral Oral   Resp: 18 20 21    Height:      Weight:      SpO2: 99% 98% 99% 95%   Weight change:   Intake/Output Summary (Last 24 hours) at 04/13/11 0936 Last data filed at 04/13/11 4098  Gross per 24 hour  Intake      0 ml  Output      1 ml  Net     -1 ml  Physical Exam: Neurologic Examination:  Mental Status:  A and O times 3, no distress, understanding shown of plan   Cranial Nerves:  II: patient does respond to confrontation bilaterally, pupils right 3 mm, left 3 mm,and intact bilaterally  III,IV,VI: EOM intact, visual fields intact.  V,VII: no facial asymmetry or slurring of speech VIII: patient does not respond to verbal stimuli  IX,X: gag reflex present, XI: trapezius strength unable to test bilaterally  XII: tongue strength unable to test  Motor:  Moves all 4 extremities spontaneously and follows commands. No aphasia, follows 3 step commands. Sensory:  Sensation intact bilaterally Deep Tendon Reflexes:  2+ throughout Plantars:  downgoing bilaterally Cerebellar:  Finger to nose intact, gait not tested  Lab Results: Basic Metabolic Panel:  Lab 04/13/11 1191 04/12/11 1738  NA 142 136  K 4.1 3.9  CL 108 103  CO2 28 25  GLUCOSE 135* 160*  BUN 26* 24*  CREATININE 0.76 0.83  CALCIUM 9.5 10.2  MG 1.6 --  PHOS -- --   Liver Function  Tests:  Lab 04/13/11 0500 04/12/11 1738  AST 10 11  ALT 5 6  ALKPHOS 52 58  BILITOT 0.5 0.8  PROT 6.1 6.7  ALBUMIN 3.2* 3.7  CBC:  Lab 04/13/11 0500 04/12/11 1738  WBC 3.0* 4.4  NEUTROABS -- 2.7  HGB 9.8* 9.6*  HCT 30.8* 31.2*  MCV 92.5 92.3  PLT 158 154   Cardiac Enzymes:  Lab 04/12/11 1739  CKTOTAL 47  CKMB 2.0  CKMBINDEX --  TROPONINI <0.30  CBG:  Lab 04/13/11 0828  GLUCAP 119*  Fasting Lipid Panel:  Lab 04/13/11 0500  CHOL 124  HDL 51  LDLCALC 62  TRIG 54  CHOLHDL 2.4  LDLDIRECT --  Coagulation:  Lab 04/12/11 1738  LABPROT 14.1  INR 1.07    Micro Results: No results found for this or any previous visit (from the past 240 hour(s)). Studies/Results: Dg Chest 2 View  04/13/2011  *RADIOLOGY REPORT*  Clinical Data: Stroke  CHEST - 2 VIEW  Comparison: 03/25/2011  Findings: The cardiopericardial silhouette is enlarged. Hyperexpansion is consistent with emphysema. Interstitial markings are diffusely coarsened with chronic features.  No edema or focal airspace consolidation.  Pulmonary arterial prominence has decreased in the interval. Bones are diffusely demineralized. Stable appearance of left acromioclavicular space widening.  IMPRESSION: Cardiomegaly with emphysema.  No acute cardiopulmonary findings.  Original Report Authenticated By: ERIC A. MANSELL, M.D.   Ct Head Wo Contrast  04/12/2011  *RADIOLOGY REPORT*  Clinical Data: Code stroke.  Confusion with slurred speech.  CT HEAD WITHOUT CONTRAST  Technique:  Contiguous axial images were obtained from the base of the skull through the vertex without contrast.  Comparison: 03/25/2011  Findings: There is no evidence for acute hemorrhage, hydrocephalus, mass lesion, or abnormal extra-axial fluid collection.  No definite CT evidence for acute infarction.  Diffuse loss of parenchymal volume is consistent with atrophy. Patchy low attenuation in the deep hemispheric and periventricular white matter is nonspecific, but  likely reflects chronic microvascular ischemic demyelination. Lacunar infarcts are seen in the basal ganglia bilaterally, as before.  The visualized paranasal sinuses and mastoid air cells are clear.  IMPRESSION: Stable.  Atrophy with chronic small vessel white matter disease and bilateral lacunar infarctions within the basal ganglia.  No acute findings.  I personally called these results to Dr. Anitra Lauth in the emergency department at 1750 hours on 04/12/2011.  Original Report Authenticated By: ERIC A. MANSELL, M.D.   Medications: I have reviewed the patient's current medications. Scheduled Meds:   . amLODipine  10 mg Oral Daily  . aspirin  325 mg Oral Daily  . calcium carbonate  1 tablet Oral BID WC  . carvedilol  20 mg Oral Daily  . docusate sodium  100 mg Oral BID  . insulin aspart  0-9 Units Subcutaneous TID WC  . olmesartan  20 mg Oral Daily  . pantoprazole  40 mg Oral Daily  . pioglitazone  30 mg Oral Daily  . pregabalin  50 mg Oral QHS  . rOPINIRole  2 mg Oral QHS  . simvastatin  20 mg Oral Daily  . DISCONTD: amlodipine-atorvastatin  1 tablet Oral Daily  . DISCONTD: aspirin  300 mg Rectal Daily  . DISCONTD: calcium carbonate  600 mg Oral BID WC   Continuous Infusions:   . sodium chloride 75 mL/hr at 04/12/11 2220   PRN Meds:.acetaminophen, senna-docusate Assessment/Plan: 1. Altered mental status - pt is back at baseline. Concern for TIA versus seizure activity. We are getting EEG, MRI, echo, carotid dopplers. Work up pending. We will continue to follow up on the results of these tests.   Other Medical Problems:  Diabetes mellitus  CVA, old, hemiparesis  Hypertension   LOS: 1 day   Genella Mech 04/13/2011, 9:36 AM

## 2011-04-13 NOTE — Plan of Care (Signed)
Problem: Phase I Progression Outcomes Goal: Initial discharge plan identified Outcome: Progressing Wants to talk to be evaluated by OT for someone to help at home.

## 2011-04-13 NOTE — Progress Notes (Signed)
Patient ID: Elizabeth Pugh, female   DOB: [birth year], 77 y.o.   MRN: [id number]  Subjective: No events overnight. Patient denies chest pain, shortness of breath, abdominal pain.   Objective:  Vital signs in last 24 hours:  Filed Vitals:   2011/05/08 0554 05-08-2011 0830 May 08, 2011 1106 05/08/11 1149  BP: 143/61  153/73 148/63  Pulse: 83  64   Temp: 98.6 F (37 C)  97.9 F (36.6 C)   TempSrc: Oral  Oral   Resp: 21  20   Height:      Weight:      SpO2: 99% 95% 100%     Intake/Output from previous day:   Intake/Output Summary (Last 24 hours) at 05-08-11 1322 Last data filed at 05/08/11 1314  Gross per 24 hour  Intake    600 ml  Output      4 ml  Net    596 ml    Physical Exam: General: Alert, awake, follows commands appropriately, in no acute distress. HEENT: No bruits, no goiter. Moist mucous membranes, no scleral icterus, no conjunctival pallor. Heart: Regular rate and rhythm, without murmurs, rubs, gallops. Lungs: Clear to auscultation bilaterally. No wheezing, no rhonchi, no rales.  Abdomen: Soft, nontender, nondistended, positive bowel sounds. Extremities: No clubbing cyanosis or edema,  positive pedal pulses. Neuro: non focal  Lab Results:  Basic Metabolic Panel:    Component Value Date/Time   NA 142 2011/05/08 0500   K 4.1 05/08/2011 0500   CL 108 2011-05-08 0500   CO2 28 05-08-2011 0500   BUN 26* 2011-05-08 0500   CREATININE 0.76 08-May-2011 0500   GLUCOSE 135* 05/08/2011 0500   CALCIUM 9.5 05/08/11 0500   CBC:    Component Value Date/Time   WBC 3.0* 2011-05-08 0500   HGB 9.8* 08-May-2011 0500   HCT 30.8* 2011-05-08 0500   PLT 158 05/08/2011 0500   MCV 92.5 2011/05/08 0500   NEUTROABS 2.7 04/12/2011 1738   LYMPHSABS 1.3 04/12/2011 1738   MONOABS 0.4 04/12/2011 1738   EOSABS 0.0 04/12/2011 1738   BASOSABS 0.0 04/12/2011 1738      Lab 05/08/2011 0500 04/12/11 1738  WBC 3.0* 4.4  HGB 9.8* 9.6*  HCT 30.8* 31.2*  PLT 158 154  MCV 92.5 92.3  MCH 29.4  28.4  MCHC 31.8 30.8  RDW 14.8 14.6  LYMPHSABS -- 1.3  MONOABS -- 0.4  EOSABS -- 0.0  BASOSABS -- 0.0  BANDABS -- --    Lab 08-May-2011 0500 04/12/11 1738  NA 142 136  K 4.1 3.9  CL 108 103  CO2 28 25  GLUCOSE 135* 160*  BUN 26* 24*  CREATININE 0.76 0.83  CALCIUM 9.5 10.2  MG 1.6 --    Lab 04/12/11 1738  INR 1.07  PROTIME --   Cardiac markers:  Lab 04/12/11 1739  CKMB 2.0  TROPONINI <0.30  MYOGLOBIN --   No components found with this basename: POCBNP:3 No results found for this or any previous visit (from the past 240 hour(s)).  Studies/Results: Dg Chest 2 View 05-08-11  IMPRESSION: Cardiomegaly with emphysema.  No acute cardiopulmonary findings.    Ct Head Wo Contrast 04/12/2011  IMPRESSION: Stable.  Atrophy with chronic small vessel white matter disease and bilateral lacunar infarctions within the basal ganglia.  No acute findings.  I personally called these results to Dr. Anitra Lauth in the emergency department at 1750 hours on 04/12/2011.    Medications: Scheduled Meds:   . amLODipine  10 mg Oral Daily  .  aspirin  325 mg Oral Daily  . calcium carbonate  1 tablet Oral BID WC  . carvedilol  20 mg Oral Daily  . docusate sodium  100 mg Oral BID  . insulin aspart  0-9 Units Subcutaneous TID WC  . olmesartan  20 mg Oral Daily  . pantoprazole  40 mg Oral Daily  . pioglitazone  30 mg Oral Daily  . pregabalin  50 mg Oral QHS  . rOPINIRole  2 mg Oral BID  . simvastatin  20 mg Oral Daily  . DISCONTD: amlodipine-atorvastatin  1 tablet Oral Daily  . DISCONTD: aspirin  300 mg Rectal Daily  . DISCONTD: calcium carbonate  600 mg Oral BID WC  . DISCONTD: rOPINIRole  2 mg Oral QHS   Continuous Infusions:   . sodium chloride 75 mL/hr at 04/12/11 2220   PRN Meds:.acetaminophen, senna-docusate  Assessment/Plan:  Principal Problem:  *Altered mental status - now back to pt's baseline - neurology following - appreciate input - follow up on recommendation and MRI,  EEG, carotid dopplers, 2 D ECHO - order PT evaluation - LDL is at goal, A1C is pending at this time  Active Problems:  Diabetes mellitus - A1C pending   Hypertension - well controlled   Disposition - plan of care and diagnosis, diagnostic studies and test results were discussed with pt and family at bedside - pt and  family verbalized understanding    LOS: 1 day   MAGICK-Daine Gunther 04/13/2011, 1:22 PM

## 2011-04-13 NOTE — Progress Notes (Signed)
*  PRELIMINARY RESULTS* EEG has been performed.  Elizabeth Pugh 04/13/2011, 3:44 PM

## 2011-04-13 NOTE — Progress Notes (Signed)
*  PRELIMINARY RESULTS*  Carotid Doppler has been performed. Bilateral:  No evidence of hemodynamically significant internal carotid artery stenosis.  Vertebral artery flow is antegrade.      Farrel Demark 04/13/2011, 5:08 PM

## 2011-04-13 NOTE — Progress Notes (Signed)
Physical Therapy Evaluation Patient Details Name: Elizabeth Pugh MRN: [id number] DOB: [birth year] Today's Date: 04/13/2011  Problem List:  Patient Active Problem List  Diagnoses  . Diabetes mellitus  . CVA, old, hemiparesis  . Hypertension  . Altered mental status    Past Medical History:  Past Medical History  Diagnosis Date  . Stroke   . GERD (gastroesophageal reflux disease)   . Hypertension   . Diabetes mellitus   . Blood transfusion   . Arthritis   . Anxiety   . Anemia   . GI (gastrointestinal bleed)     Due to benign mass  . Bronchitis   . Restless leg syndrome    Past Surgical History:  Past Surgical History  Procedure Date  . Eye surgery     Cataract removal  . Abdominal hysterectomy   . Tubal ligation     PT Assessment/Plan/Recommendation PT Assessment Clinical Impression Statement: Pt doing well however limited by fatigue. Pt reports she is close to baseline. Would like to work stairs prior to D/C as she has 5 to enter her house and will likely need to make sure an individual is there to assist her up her stairs at home. She is working on getting a ramp built. Pt currently at supervisional level for ambulation.  PT Recommendation/Assessment: Patient will need skilled PT in the acute care venue PT Problem List: Decreased strength;Decreased activity tolerance;Decreased mobility;Decreased balance (anticipate decr. balance) Barriers to Discharge Comments: 5 stairs to enter PT Therapy Diagnosis : Difficulty walking;Abnormality of gait;Generalized weakness PT Plan PT Frequency: Min 3X/week PT Treatment/Interventions: DME instruction;Gait training;Stair training;Functional mobility training;Therapeutic activities;Therapeutic exercise;Balance training;Patient/family education PT Recommendation Follow Up Recommendations: Home health PT;24 hour supervision/assistance (? [facility] vs. HH[facility] per pt request) Equipment Recommended: None recommended by PT PT Goals  Acute  Rehab PT Goals PT Goal Formulation: With patient Time For Goal Achievement: 2 weeks Pt will go Sit to Stand: with modified independence PT Goal: Sit to Stand - Progress: Progressing toward goal Pt will go Stand to Sit: with modified independence PT Goal: Stand to Sit - Progress: Progressing toward goal Pt will Transfer Bed to Chair/Chair to Bed: with modified independence PT Transfer Goal: Bed to Chair/Chair to Bed - Progress: Progressing toward goal Pt will Ambulate: 51 - 150 feet;with modified independence;with rolling walker PT Goal: Ambulate - Progress: Progressing toward goal Pt will Go Up / Down Stairs: 3-5 stairs;with supervision;with least restrictive assistive device PT Goal: Up/Down Stairs - Progress: Progressing toward goal  PT Evaluation Precautions/Restrictions  Precautions Precautions: Fall Precaution Comments: Secondary to generalized weakness Prior Functioning  Home Living Lives With: Daughter Receives Help From: Family Type of Home: House Home Layout: One level Home Access: Stairs to enter Entrance Stairs-Rails: Can reach both Entrance Stairs-Number of Steps: 5 Bathroom Shower/Tub: Tub/shower unit;Walk-in shower Bathroom Toilet: Standard Bathroom Accessibility: Yes How Accessible: Accessible via walker Home Adaptive Equipment: Bedside commode/3-in-1;Walker - rolling;Walker - four wheeled;Tub transfer bench Prior Function Level of Independence: Requires assistive device for independence Driving: No Vocation: Retired Comments: Pt reports she will need an assistant soon to help her at home as her daughter is going to be getting knee replacement soon Cognition Cognition Arousal/Alertness: Awake/alert Overall Cognitive Status: Appears within functional limits for tasks assessed Orientation Level: Oriented X4 Sensation/Coordination Sensation Light Touch: Appears Intact Extremity Assessment RLE Assessment RLE Assessment: Exceptions to [facility] RLE AROM  (degrees) Overall AROM Right Lower Extremity: Within functional limits for tasks assessed RLE Strength RLE Overall Strength: Deficits Right Knee  Flexion: 3+/5 Right Knee Extension: 3+/5 Right Ankle Dorsiflexion: 3+/5 Right Ankle Plantar Flexion: 4/5 LLE Assessment LLE Assessment: Exceptions to WFL LLE AROM (degrees) Overall AROM Left Lower Extremity: Within functional limits for tasks assessed LLE Strength LLE Overall Strength: Deficits LLE Overall Strength Comments: Lt. LE functional deficits greater than Rt.  Left Knee Flexion: 3/5 Left Knee Extension: 3/5 Left Ankle Dorsiflexion: 3+/5 Left Ankle Plantar Flexion: 4/5 Mobility (including Balance) Bed Mobility Bed Mobility: Yes Rolling Left: 6: Modified independent (Device/Increase time) Left Sidelying to Sit: 6: Modified independent (Device/Increase time);With rails;HOB flat Sitting - Scoot to Edge of Bed: 4: Min assist Sitting - Scoot to [place] of Bed Details (indicate cue type and reason): Assist for pelvic advancement secondary to slight functional weakness Transfers Transfers: Yes Sit to Stand: 5: Supervision Sit to Stand Details (indicate cue type and reason): Verbal cues for UE placement. Pt requires RW in standing Stand to Sit: 5: Supervision Stand to Sit Details: Verbal cues for control of descent. Pt tends to "plop" into chair Ambulation/Gait Ambulation/Gait: Yes Ambulation/Gait Assistance: 5: Supervision Ambulation/Gait Assistance Details (indicate cue type and reason): Close supervision, verbal cues to monitor fatigue.  Ambulation Distance (Feet): 40 Feet Assistive device: Rolling walker Gait Pattern: Decreased stride length;Decreased stance time - left;Trunk flexed Gait velocity: Very slow gait  Posture/Postural Control Posture/Postural Control: No significant limitations Balance Balance Assessed: Yes Static Sitting Balance Static Sitting - Balance Support: No upper extremity supported;Feet supported Static  Sitting - Level of Assistance: 7: Independent Exercise  General Exercises - Lower Extremity Ankle Circles/Pumps: AROM;Strengthening;Both;20 reps;Seated Hip Flexion/Marching: AROM;Both;5 reps;Seated End of Session PT - End of Session Equipment Utilized During Treatment: Gait belt Activity Tolerance: Patient tolerated treatment well;Patient limited by fatigue Patient left: in chair;with call bell in reach Nurse Communication: Mobility status for transfers;Mobility status for ambulation General Behavior During Session: [facility] for tasks performed Cognition: [facility] for tasks performed  Sherie Don 04/13/2011, 9:49 AM  Sherie Don) Carleene Mains PT, DPT Acute Rehabilitation [phone]

## 2011-04-13 NOTE — Progress Notes (Signed)
  Echocardiogram 2D Echocardiogram has been performed.  Lopaka Karge Nira Retort 04/13/2011, 12:31 PM

## 2011-04-14 LAB — BASIC METABOLIC PANEL
Chloride: 106 mEq/L (ref 96–112)
Creatinine, Ser: 0.68 mg/dL (ref 0.50–1.10)
GFR calc Af Amer: >90 mL/min (ref >90)
Potassium: 4.1 mEq/L (ref 3.5–5.1)
Sodium: 140 mEq/L (ref 135–145)

## 2011-04-14 LAB — GLUCOSE, CAPILLARY: Glucose-Capillary: 175 mg/dL — ABNORMAL HIGH (ref 70–99)

## 2011-04-14 LAB — CBC
MCV: 92.5 fL (ref 78.0–100.0)
Platelets: 177 10*3/uL (ref 150–400)
RDW: 14.8 % (ref 11.5–15.5)
WBC: 3 10*3/uL — ABNORMAL LOW (ref 4.0–10.5)

## 2011-04-14 NOTE — Progress Notes (Signed)
Utilization review complete 

## 2011-04-14 NOTE — Progress Notes (Signed)
Late note: Pt began to show 1st deg HB on tele yesterday AM (12-10). MD was notified yesterday.

## 2011-04-14 NOTE — Plan of Care (Signed)
Problem: Phase II Progression Outcomes Goal: Discharge plan established Pt doing fairly well, limited by faigue and L LE/knee pain. Recommend HH OT eval for ADL safety

## 2011-04-14 NOTE — Progress Notes (Signed)
Subjective: Pt is sitting up in chair eating breakfast. Feels at her baseline, no recurrence of symptoms while in the hospital. Discussed results of MRI/MRA and told her that echo results are not back yet but may trial AED. She verbalized understanding and agreement with the plan.   Objective: Vital signs in last 24 hours: Filed Vitals:   04/13/11 1427 04/13/11 1803 04/13/11 2100 04/14/11 0500  BP: 139/66 144/76 125/77 129/72  Pulse: 46 68 62 81  Temp: 97.4 F (36.3 C) 97.5 F (36.4 C) 97.9 F (36.6 C) 97.7 F (36.5 C)  TempSrc: Oral Oral Oral Oral  Resp: 18 20 18 22   Height:      Weight:      SpO2: 100% 100% 100% 98%   Weight change:   Intake/Output Summary (Last 24 hours) at 04/14/11 0925 Last data filed at 04/14/11 0430  Gross per 24 hour  Intake   1300 ml  Output    378 ml  Net    922 ml   Physical Exam: Neurologic Examination:  Mental Status:  A and O times 3, no distress, understanding shown of plan  Cranial Nerves:  II: patient does respond to confrontation bilaterally, pupils right 3 mm, left 3 mm,and intact bilaterally  III,IV,VI: EOM intact, visual fields intact.  V,VII: no facial asymmetry or slurring of speech  VIII: patient does not respond to verbal stimuli  IX,X: gag reflex present, XI: trapezius strength unable to test bilaterally  XII: tongue strength normal Motor:  Moves all 4 extremities spontaneously and follows commands. No aphasia, follows 3 step commands.  Sensory:  Sensation intact bilaterally  Deep Tendon Reflexes:  2+ throughout  Plantars:  downgoing bilaterally  Cerebellar:  Finger to nose intact, gait not tested  Lab Results: Basic Metabolic Panel:  Lab 04/14/11 4098 04/13/11 0500  NA 140 142  K 4.1 4.1  CL 106 108  CO2 26 28  GLUCOSE 183* 135*  BUN 21 26*  CREATININE 0.68 0.76  CALCIUM 8.6 9.5  MG -- 1.6  PHOS -- --   Liver Function Tests:  Lab 04/13/11 0500 04/12/11 1738  AST 10 11  ALT 5 6  ALKPHOS 52 58  BILITOT  0.5 0.8  PROT 6.1 6.7  ALBUMIN 3.2* 3.7   CBC:  Lab 04/14/11 0632 04/13/11 0500 04/12/11 1738  WBC 3.0* 3.0* --  NEUTROABS -- -- 2.7  HGB 10.1* 9.8* --  HCT 32.0* 30.8* --  MCV 92.5 92.5 --  PLT 177 158 --   Cardiac Enzymes:  Lab 04/12/11 1739  CKTOTAL 47  CKMB 2.0  CKMBINDEX --  TROPONINI <0.30   CBG:  Lab 04/14/11 0801 04/13/11 2250 04/13/11 1725 04/13/11 1159 04/13/11 0828  GLUCAP 154* 185* 123* 186* 119*   Hemoglobin A1C:  Lab 04/13/11 0500  HGBA1C 6.7*   Fasting Lipid Panel:  Lab 04/13/11 0500  CHOL 124  HDL 51  LDLCALC 62  TRIG 54  CHOLHDL 2.4  LDLDIRECT --   Thyroid Function Tests:  Lab 04/13/11 0500  TSH 0.641  T4TOTAL --  FREET4 --  T3FREE --  THYROIDAB --   Coagulation:  Lab 04/12/11 1738  LABPROT 14.1  INR 1.07   Micro Results: No results found for this or any previous visit (from the past 240 hour(s)). Studies/Results: Dg Chest 2 View  04/13/2011  *RADIOLOGY REPORT*  Clinical Data: Stroke  CHEST - 2 VIEW  Comparison: 03/25/2011  Findings: The cardiopericardial silhouette is enlarged. Hyperexpansion is consistent with emphysema. Interstitial markings  are diffusely coarsened with chronic features.  No edema or focal airspace consolidation.  Pulmonary arterial prominence has decreased in the interval. Bones are diffusely demineralized. Stable appearance of left acromioclavicular space widening.  IMPRESSION: Cardiomegaly with emphysema.  No acute cardiopulmonary findings.  Original Report Authenticated By: ERIC A. MANSELL, M.D.   Ct Head Wo Contrast  04/12/2011  *RADIOLOGY REPORT*  Clinical Data: Code stroke.  Confusion with slurred speech.  CT HEAD WITHOUT CONTRAST  Technique:  Contiguous axial images were obtained from the base of the skull through the vertex without contrast.  Comparison: 03/25/2011  Findings: There is no evidence for acute hemorrhage, hydrocephalus, mass lesion, or abnormal extra-axial fluid collection.  No definite CT  evidence for acute infarction.  Diffuse loss of parenchymal volume is consistent with atrophy. Patchy low attenuation in the deep hemispheric and periventricular white matter is nonspecific, but likely reflects chronic microvascular ischemic demyelination. Lacunar infarcts are seen in the basal ganglia bilaterally, as before.  The visualized paranasal sinuses and mastoid air cells are clear.  IMPRESSION: Stable.  Atrophy with chronic small vessel white matter disease and bilateral lacunar infarctions within the basal ganglia.  No acute findings.  I personally called these results to Dr. Anitra Lauth in the emergency department at 1750 hours on 04/12/2011.  Original Report Authenticated By: ERIC A. MANSELL, M.D.   Mr Brain Wo Contrast  04/13/2011  *RADIOLOGY REPORT*  Clinical Data:  Brief episode of confusion with slurred speech and right facial droop.  MRI HEAD WITHOUT CONTRAST  MRA HEAD WITHOUT CONTRAST  Technique: Multiplanar, multiecho pulse sequences of the brain and surrounding structures were obtained according to standard protocol without intravenous contrast.  Angiographic images of the head were obtained using MRA technique without contrast.  Comparison: 04/12/2011 CT.  03/25/2011 MR. The  MRI HEAD  Findings:  No acute infarct.  Remote right paracentral pontine infarct.  Remote basal ganglia/left thalamic infarcts.  Small vessel disease type changes.  No intracranial hemorrhage.  No intracranial mass lesion detected on this unenhanced exam.  Global atrophy without hydrocephalus.  Basilar artery impresses upon the hypothalamic region.  Heterogeneous appearance of the clivus unchanged.  Cervical spondylotic changes with kyphosis C2-3 and mild spinal stenosis.  Major intracranial vascular structures are patent.  Polypoid opacification right maxillary sinus.  IMPRESSION: No acute infarct.  Please see above.  MRA HEAD  Findings: Ectasia of the distal vertical cervical segment and proximal petrous segment of the  right internal carotid artery.  Mild ectasia with areas of mild narrowing and irregularity involving the cavernous of the internal carotid artery bilaterally.  Mild narrowing supraclinoid segment left internal carotid artery with mild to moderate narrowing supraclinoid segment right internal carotid artery.  High-grade focal stenosis proximal A1 segment of the left anterior cerebral artery.  Moderate focal stenosis distal A1 segment right anterior cerebral artery.  High-grade focal stenosis A2 segment of the left anterior cerebral artery.  Mild irregularity of the M1 segment of the middle cerebral artery bilaterally.  Middle cerebral artery moderate branch vessel irregularity bilaterally.  Right vertebral artery is dominant with mild narrowing of the distal right vertebral artery.  Mild narrowing distal left vertebral artery.  Moderate narrowing proximal basilar artery.  Ectatic ectatic distal basilar artery.  Poor delineation of the PICAs and AICAs.  Moderate narrowing of the proximal right posterior cerebral artery.  Mild to moderate posterior cerebral artery distal branch vessel irregularity bilaterally.  No aneurysm noted.  IMPRESSION: Prominent intracranial atherosclerotic type changes as detailed above.  Original Report Authenticated By: Fuller Canada, M.D.   Mr Mra Head/brain Wo Cm  04/13/2011  *RADIOLOGY REPORT*  Clinical Data:  Brief episode of confusion with slurred speech and right facial droop.  MRI HEAD WITHOUT CONTRAST  MRA HEAD WITHOUT CONTRAST  Technique: Multiplanar, multiecho pulse sequences of the brain and surrounding structures were obtained according to standard protocol without intravenous contrast.  Angiographic images of the head were obtained using MRA technique without contrast.  Comparison: 04/12/2011 CT.  03/25/2011 MR. The  MRI HEAD  Findings:  No acute infarct.  Remote right paracentral pontine infarct.  Remote basal ganglia/left thalamic infarcts.  Small vessel disease type  changes.  No intracranial hemorrhage.  No intracranial mass lesion detected on this unenhanced exam.  Global atrophy without hydrocephalus.  Basilar artery impresses upon the hypothalamic region.  Heterogeneous appearance of the clivus unchanged.  Cervical spondylotic changes with kyphosis C2-3 and mild spinal stenosis.  Major intracranial vascular structures are patent.  Polypoid opacification right maxillary sinus.  IMPRESSION: No acute infarct.  Please see above.  MRA HEAD  Findings: Ectasia of the distal vertical cervical segment and proximal petrous segment of the right internal carotid artery.  Mild ectasia with areas of mild narrowing and irregularity involving the cavernous of the internal carotid artery bilaterally.  Mild narrowing supraclinoid segment left internal carotid artery with mild to moderate narrowing supraclinoid segment right internal carotid artery.  High-grade focal stenosis proximal A1 segment of the left anterior cerebral artery.  Moderate focal stenosis distal A1 segment right anterior cerebral artery.  High-grade focal stenosis A2 segment of the left anterior cerebral artery.  Mild irregularity of the M1 segment of the middle cerebral artery bilaterally.  Middle cerebral artery moderate branch vessel irregularity bilaterally.  Right vertebral artery is dominant with mild narrowing of the distal right vertebral artery.  Mild narrowing distal left vertebral artery.  Moderate narrowing proximal basilar artery.  Ectatic ectatic distal basilar artery.  Poor delineation of the PICAs and AICAs.  Moderate narrowing of the proximal right posterior cerebral artery.  Mild to moderate posterior cerebral artery distal branch vessel irregularity bilaterally.  No aneurysm noted.  IMPRESSION: Prominent intracranial atherosclerotic type changes as detailed above.  Original Report Authenticated By: Fuller Canada, M.D.   Medications: I have reviewed the patient's current medications. Scheduled Meds:    . amLODipine  10 mg Oral Daily  . aspirin  325 mg Oral Daily  . calcium carbonate  1 tablet Oral BID WC  . carvedilol  20 mg Oral Daily  . docusate sodium  100 mg Oral BID  . insulin aspart  0-9 Units Subcutaneous TID WC  . magnesium sulfate IVPB  2 g Intravenous Once  . olmesartan  20 mg Oral Daily  . pantoprazole  40 mg Oral Daily  . pioglitazone  30 mg Oral Daily  . pregabalin  50 mg Oral QHS  . rOPINIRole  2 mg Oral BID  . simvastatin  20 mg Oral Daily  . DISCONTD: rOPINIRole  2 mg Oral QHS   Continuous Infusions:   . DISCONTD: sodium chloride 75 mL/hr at 04/12/11 2220   PRN Meds:.acetaminophen, senna-docusate Assessment/Plan: 1. Altered mental status - incident has resolved and has not recurred during hospitalization. The MRI and MRA show no changes recently. EEG done today and will be read. Even if no overt seizure activity would trial AED therapy to see if she gets recurrence of incidence. History of event is still suspicious for seizure activity with  post ictal state. Would be stable for out-patient neurology follow up. May need some PT as out-pt as well for functional status.   Other Medical Problems:  Diabetes mellitus  CVA, old, hemiparesis  Hypertension   LOS: 2 days   Genella Mech 04/14/2011, 9:25 AM  Addendum: Seizure showed no epileptiform activity, but this is her second stereotypical event of aphasia with facial droop with normal MRI's. Will see what the stroke work-up shows. If no evidence for A-fib or gross cardiac pathology. Will start on Keppera 500 mg PO bid for seizure prophylaxis.   Pragya Lofaso 04/14/2011, 10:14 AM

## 2011-04-14 NOTE — Progress Notes (Signed)
Patient ID: Elizabeth Pugh, female   DOB: 07/09/1926, 77 y.o.   MRN: [id number] Subjective: No events overnight. Patient denies chest pain, shortness of breath, abdominal pain. Had bowel movement and reports ambulating.  Objective:  Vital signs in last 24 hours:  Filed Vitals:   05/04/11 1803 2011-05-04 2100 04/14/11 0500 04/14/11 1118  BP: 144/76 125/77 129/72 150/77  Pulse: 68 62 81 59  Temp: 97.5 F (36.4 C) 97.9 F (36.6 C) 97.7 F (36.5 C) 97.3 F (36.3 C)  TempSrc: Oral Oral Oral Oral  Resp: 20 18 22 20   Height:      Weight:      SpO2: 100% 100% 98% 97%    Intake/Output from previous day:   Intake/Output Summary (Last 24 hours) at 04/14/11 1335 Last data filed at 04/14/11 1100  Gross per 24 hour  Intake    940 ml  Output    375 ml  Net    565 ml    Physical Exam: General: Alert, awake, oriented x3, in no acute distress. HEENT: No bruits, no goiter. Moist mucous membranes, no scleral icterus, no conjunctival pallor. Heart: Regular rate and rhythm, without murmurs, rubs, gallops. Lungs: Clear to auscultation bilaterally. No wheezing, no rhonchi, no rales.  Abdomen: Soft, nontender, nondistended, positive bowel sounds. Extremities: No clubbing cyanosis or edema,  positive pedal pulses. Neuro: Grossly intact, nonfocal.  Lab Results:  Basic Metabolic Panel:    Component Value Date/Time   NA 140 04/14/2011 0632   K 4.1 04/14/2011 0632   CL 106 04/14/2011 0632   CO2 26 04/14/2011 0632   BUN 21 04/14/2011 0632   CREATININE 0.68 04/14/2011 0632   GLUCOSE 183* 04/14/2011 0632   CALCIUM 8.6 04/14/2011 0632   CBC:    Component Value Date/Time   WBC 3.0* 04/14/2011 0632   HGB 10.1* 04/14/2011 0632   HCT 32.0* 04/14/2011 0632   PLT 177 04/14/2011 0632   MCV 92.5 04/14/2011 0632   NEUTROABS 2.7 04/12/2011 1738   LYMPHSABS 1.3 04/12/2011 1738   MONOABS 0.4 04/12/2011 1738   EOSABS 0.0 04/12/2011 1738   BASOSABS 0.0 04/12/2011 1738      Lab 04/14/11 0632  May 04, 2011 0500 04/12/11 1738  WBC 3.0* 3.0* 4.4  HGB 10.1* 9.8* 9.6*  HCT 32.0* 30.8* 31.2*  PLT 177 158 154  MCV 92.5 92.5 92.3  MCH 29.2 29.4 28.4  MCHC 31.6 31.8 30.8  RDW 14.8 14.8 14.6  LYMPHSABS -- -- 1.3  MONOABS -- -- 0.4  EOSABS -- -- 0.0  BASOSABS -- -- 0.0  BANDABS -- -- --    Lab 04/14/11 0632 05-04-2011 0500 04/12/11 1738  NA 140 142 136  K 4.1 4.1 3.9  CL 106 108 103  CO2 26 28 25   GLUCOSE 183* 135* 160*  BUN 21 26* 24*  CREATININE 0.68 0.76 0.83  CALCIUM 8.6 9.5 10.2  MG -- 1.6 --    Lab 04/12/11 1738  INR 1.07  PROTIME --   Cardiac markers:  Lab 04/12/11 1739  CKMB 2.0  TROPONINI <0.30  MYOGLOBIN --   No components found with this basename: POCBNP:3 No results found for this or any previous visit (from the past 240 hour(s)).  Studies/Results: Dg Chest 2 View 05-04-11  IMPRESSION: Cardiomegaly with emphysema.  No acute cardiopulmonary findings.  Ct Head Wo Contrast 04/12/2011   IMPRESSION: Stable.  Atrophy with chronic small vessel white matter disease and bilateral lacunar infarctions within the basal ganglia.  No acute findings.  Mr Brain Wo Contrast 04/13/2011    IMPRESSION: Prominent intracranial atherosclerotic type changes as detailed above.   Mr Maxine Glenn Head/brain Wo Cm 04/13/2011   IMPRESSION: Prominent intracranial atherosclerotic type changes as detailed above.     Medications: Scheduled Meds:   . amLODipine  10 mg Oral Daily  . aspirin  325 mg Oral Daily  . calcium carbonate  1 tablet Oral BID WC  . carvedilol  20 mg Oral Daily  . docusate sodium  100 mg Oral BID  . insulin aspart  0-9 Units Subcutaneous TID WC  . magnesium sulfate 1 - 4 g bolus IVPB  2 g Intravenous Once  . olmesartan  20 mg Oral Daily  . pantoprazole  40 mg Oral Daily  . pioglitazone  30 mg Oral Daily  . pregabalin  50 mg Oral QHS  . rOPINIRole  2 mg Oral BID  . simvastatin  20 mg Oral Daily   Continuous Infusions:  PRN Meds:.acetaminophen,  senna-docusate  Assessment/Plan:  Principal Problem:  *Altered mental status  - now back to pt's baseline  - neurology following  - appreciate input  - follow up on recommendations and still awaiting for 2 D ECHO and Carotid Doppler results - LDL is at goal, A1C is 6.7 - continue ASA  Active Problems:  Diabetes mellitus  - A1C 6.7 - continue with current home medication regime  HTN - well controlled to this point - continue home medication regimen  Disposition  - plan of care and diagnosis, diagnostic studies and test results were discussed with pt and family at bedside  - pt and family verbalized understanding - anticipated d/c in 1-2 days - pt and family wants to wait for all the results to be back prior to discharge   LOS: 2 days   MAGICK-MYERS, ISKRA 04/14/2011, 1:35 PM

## 2011-04-14 NOTE — Procedures (Signed)
HISTORY:  This is an 77 year old woman came in and was confused with slurred speech.  Right facial droop that resolved soon after her symptoms started.  MEDICATIONS:  She is on Lyrica.  CONDITION OF RECORDING:  This 16-lead EEG was recorded with the patient in an awake state.  Background rhythms:  Background patterns in wakefulness were well organized with a well-sustained posterior dominant rhythm of 10.5 Hz, symmetrical and reactive to eye opening and closing. Abnormal potentials:  No epileptiform activity or focal slowing was noted.  ACTIVATION PROCEDURES:  Hyperventilation was not performed.  Photic stimulation did not activate tracing.  EKG:  A single channel EKG monitoring was unremarkable.  IMPRESSION:  This is a normal awake EEG.  A normal EEG does not rule the clinical diagnosis of epilepsy.  If clinical warranted, a repeated extended EEG or ambulatory recording may be obtained for prolonged recording times and sleep capture, which may increase the diagnostic yield.  Clinical correlation is suggested.          ______________________________ Carmell Austria, MD    [id number] D:  04/13/2011 18:36:53  T:  04/13/2011 22:49:43  Job #:  [id number]

## 2011-04-14 NOTE — Progress Notes (Signed)
Occupational Therapy Evaluation Patient Details Name: Elizabeth Pugh MRN: [id number] DOB: 1927/04/26 Today's Date: 04/14/2011  Problem List:  Patient Active Problem List  Diagnoses  . Diabetes mellitus  . CVA, old, hemiparesis  . Hypertension  . Altered mental status    Past Medical History:  Past Medical History  Diagnosis Date  . Stroke   . GERD (gastroesophageal reflux disease)   . Hypertension   . Diabetes mellitus   . Blood transfusion   . Arthritis   . Anxiety   . Anemia   . GI (gastrointestinal bleed)     Due to benign mass  . Bronchitis   . Restless leg syndrome    Past Surgical History:  Past Surgical History  Procedure Date  . Eye surgery     Cataract removal  . Abdominal hysterectomy   . Tubal ligation     OT Assessment/Plan/Recommendation OT Assessment Clinical Impression Statement: Pt demos decline in function with balance, safety, acitivty tolerance and strength for ADLs and ADL mobility tasks. Pt would benefit from skilled OT services to address these impairments to increase independence and maximize level of function to return home safely OT Recommendation/Assessment: Patient will need skilled OT in the acute care venue OT Problem List: Decreased strength;Decreased activity tolerance;Impaired balance (sitting and/or standing);Decreased knowledge of precautions;Pain Barriers to Discharge: Inaccessible home environment?, steps to enter Barriers to Discharge Comments: 5 steps to enter home, pt says she will be having a ramp built. Pt concerned about level of prn assist at home as her daughter may be receiving a knee replacement soon OT Therapy Diagnosis : Generalized weakness;Acute pain OT Plan OT Frequency: Min 2X/week OT Treatment/Interventions: Self-care/ADL training;Therapeutic activities;Therapeutic exercise;Neuromuscular education;Energy conservation;DME and/or AE instruction;Patient/family education;Balance training OT Recommendation Follow Up  Recommendations: Home health OT Equipment Recommended: None recommended by OT (Pt has DME at home for ADLs) Individuals Consulted Consulted and Agree with Results and Recommendations: Patient OT Goals Acute Rehab OT Goals OT Goal Formulation: With patient Time For Goal Achievement: 7 days ADL Goals Pt Will Perform Grooming: with set-up;with supervision;Standing at sink Pt Will Perform Lower Body Bathing: with min assist;Sitting, edge of bed;Sit to stand from bed Pt Will Perform Lower Body Dressing: with min assist;Sitting, bed;Sit to stand from bed Pt Will Transfer to Toilet: with supervision;with DME Pt Will Perform Toileting - Clothing Manipulation: with supervision;Standing  OT Evaluation Precautions/Restrictions  Precautions Precautions: Fall Precaution Comments: Secondary to generalized weakness Restrictions Weight Bearing Restrictions: No Prior Functioning   Prior Function Level of Independence: Independent with basic ADLs;Requires assistive device for independence Vocation: Retired Comments: Pt stated that she would like to have someone help her with her bathing at home, her daughter needs knee surgery soon ADL ADL Eating/Feeding: Not assessed Grooming: Wash/dry hands;Wash/dry face;Brushing hair;Set up Where Assessed - Grooming: Sitting, chair Upper Body Bathing: Simulated;Set up Where Assessed - Upper Body Bathing: Sitting, chair Lower Body Bathing: Simulated;Moderate assistance Where Assessed - Lower Body Bathing: Sitting, chair Upper Body Dressing: Performed;Set up Where Assessed - Upper Body Dressing: Sitting, chair Lower Body Dressing: Performed;Moderate assistance Lower Body Dressing Details (indicate cue type and reason): doffing/donning socks Where Assessed - Lower Body Dressing: Sitting, chair Toilet Transfer: Performed (Min guard assist) Toilet Transfer Method: Stand pivot Toilet Transfer Equipment: Extra wide bedside commode Toileting - Clothing  Manipulation: Performed;Minimal assistance Where Assessed - Glass blower/designer Manipulation: Standing Toileting - Hygiene: Performed;Set up;Supervision/safety Where Assessed - Toileting Hygiene: Sit on 3-in-1 or toilet Tub/Shower Transfer: Not assessed Equipment Used: Other (comment) (  gait belt) Vision/Perception  Vision - History Baseline Vision: Wears glasses only for reading Patient Visual Report: No change from baseline Perception Perception: Within Functional Limits Cognition Cognition Arousal/Alertness: Awake/alert Overall Cognitive Status: Appears within functional limits for tasks assessed Orientation Level: Oriented X4 Sensation/Coordination Sensation Light Touch: Appears Intact Coordination Gross Motor Movements are Fluid and Coordinated: Yes Fine Motor Movements are Fluid and Coordinated: Yes Extremity Assessment RUE Assessment RUE Assessment: Within Functional Limits (MMT 3+/5) LUE Assessment LUE Assessment: Within Functional Limits (MMT 3+/5) Mobility  Bed Mobility Bed Mobility: No Transfers Transfers: Yes Sit to Stand: Other (comment) (Min guard assist) Sit to Stand Details (indicate cue type and reason): chair, BSC Stand to Sit: Other (comment) (Min guard assist) Stand to Sit Details: BSC, cahir   End of Session OT - End of Session Equipment Utilized During Treatment: Gait belt;Other (comment) (BSC) Activity Tolerance: Patient tolerated treatment well Patient left: in chair;with call bell in reach General Behavior During Session: [facility] for tasks performed Cognition: [facility] for tasks performed   Galen Manila 04/14/2011, 10:40 AM

## 2011-04-14 NOTE — Progress Notes (Signed)
Inpatient Diabetes Program Recommendations  AACE/ADA: New Consensus Statement on Inpatient Glycemic Control (2009)  Target Ranges:  Prepandial:   less than 140 mg/dL      Peak postprandial:   less than 180 mg/dL (1-2 hours)      Critically ill patients:  140 - 180 mg/dL   Reason for Visit: History of Diabetes     Inpatient Diabetes Program Recommendations Diet: Add CHO modified medium to diet    

## 2011-04-15 LAB — BASIC METABOLIC PANEL
BUN: 28 mg/dL — ABNORMAL HIGH (ref 6–23)
Chloride: 104 mEq/L (ref 96–112)
GFR calc Af Amer: 68 mL/min — ABNORMAL LOW (ref >90)
GFR calc non Af Amer: 59 mL/min — ABNORMAL LOW (ref >90)
Glucose, Bld: 216 mg/dL — ABNORMAL HIGH (ref 70–99)
Potassium: 4.6 mEq/L (ref 3.5–5.1)
Sodium: 136 mEq/L (ref 135–145)

## 2011-04-15 LAB — CBC
HCT: 30.5 % — ABNORMAL LOW (ref 36.0–46.0)
Hemoglobin: 9.6 g/dL — ABNORMAL LOW (ref 12.0–15.0)
MCHC: 31.5 g/dL (ref 30.0–36.0)
RDW: 14.5 % (ref 11.5–15.5)
WBC: 4.2 10*3/uL (ref 4.0–10.5)

## 2011-04-15 MED ORDER — LEVETIRACETAM 500 MG PO TABS
500.0000 mg | ORAL_TABLET | Freq: Two times a day (BID) | ORAL | Status: DC
  Administered 2011-04-15 – 2011-04-17 (×5): 500 mg via ORAL
  Filled 2011-04-15 (×6): qty 1

## 2011-04-15 NOTE — Progress Notes (Signed)
PROGRESS NOTE  Elizabeth Pugh:[id number] DOB: 01/24/1927 DOA: 04/12/2011 PCP: No primary provider on file. Cardiologist: Dr. Sharyn Lull  Brief narrative: 77 year old woman recently discharged November of this year (see below) who presented to the hospital with speech difficulty and right-sided facial droop. No TPA was given secondary to low NIHSS. This was her second presentation with similar symptoms, the first being in November. She was seen by neurology in the emergency department and admitted for further evaluation.  Admitted November of this year for confusion, slurred speech and transient facial droop. Was seen in consultation with neurology. MRI was negative for stroke. There was concern for seizure and EEG was obtained. EEG was unremarkable. There was concern for arrhythmia on EEG however this was not seen on telemetry. Outpatient Holter monitor was considered through her cardiologist Dr. Sharyn Lull. Final diagnosis: Syncope with altered mental status.  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 Increase aspirin to 325 mg by mouth daily. Telemetry and 2-D echocardiogram. Carotid Dopplers. EEG.  Physical therapy: Home health physical therapy, possible home health aide. 24 hour supervision/assistance. No equipment recommended. Home health OT eval recommended.  Speech-language pathology: No further evaluation recommended. Speech/language and cognition within functional limits.  Procedures:  December 10: EEG: Normal EEG.  December 10: Bilateral Carotid Doppler: No evidence of significant internal carotid artery stenosis. Vertebral artery flow antegrade.  December 10: 2-D echocardiogram: Results pending.  12/08/2010: Nuclear stress test: Negative.  Interim  History: Incontinence December 10. Subjective: Feels better. No spells. No complaints.  Objective: Filed Vitals:   04/14/11 1756 04/14/11 2100 04/15/11 0231 04/15/11 0606  BP: 150/78 122/70 115/68 105/67  Pulse: 67 65 67 67  Temp: 97.7 F (36.5 C) 97.4 F (36.3 C) 98.3 F (36.8 C) 98.4 F (36.9 C)  TempSrc: Oral Oral Oral Oral  Resp: 18 18 21 22   Height:      Weight:      SpO2: 100% 97% 98% 95%    Intake/Output Summary (Last 24 hours) at 04/15/11 0928 Last data filed at 04/14/11 2100  Gross per 24 hour  Intake    360 ml  Output      3 ml  Net    357 ml    Exam:  General: Appears well. Sitting up in chair. Speech fluent and clear. Cardiovascular: Regular rate and rhythm. No murmur, rub or gallop. No lower extremity edema. Respiratory: Clear to auscultation bilaterally. No wheezes, rales or rhonchi. Normal respiratory effort. Neurologic: Grossly normal.  Data Reviewed: Basic Metabolic Panel:  Lab 04/15/11 8119 04/14/11 0632 04/13/11 0500 04/12/11 1738  NA 136 140 142 136  K 4.6 4.1 -- --  CL 104 106 108 103  CO2 27 26 28 25   GLUCOSE 216* 183* 135* 160*  BUN 28* 21 26* 24*  CREATININE 0.89 0.68 0.76 0.83  CALCIUM 9.4 8.6 9.5 10.2  MG -- -- 1.6 --  PHOS -- -- -- --   Liver Function Tests:  Lab 04/13/11 0500 04/12/11 1738  AST 10 11  ALT 5 6  ALKPHOS 52 58  BILITOT 0.5 0.8  PROT 6.1 6.7  ALBUMIN 3.2* 3.7   CBC:  Lab 04/15/11 0540 04/14/11 0632 04/13/11 0500 04/12/11 1738  WBC 4.2 3.0* 3.0* 4.4  NEUTROABS -- -- -- 2.7  HGB 9.6* 10.1* 9.8* 9.6*  HCT  30.5* 32.0* 30.8* 31.2*  MCV 92.7 92.5 92.5 92.3  PLT 184 177 158 154   Cardiac Enzymes:  Lab 04/12/11 1739  CKTOTAL 47  CKMB 2.0  CKMBINDEX --  TROPONINI <0.30   CBG:  Lab 04/14/11 2204 04/14/11 1741 04/14/11 1200 04/14/11 0801 04/13/11 2250  GLUCAP 175* 161* 206* 154* 185*   Studies: Dg Chest 2 View  04/13/2011  *RADIOLOGY REPORT*  Clinical Data: Stroke  CHEST - 2 VIEW  Comparison:  03/25/2011  Findings: The cardiopericardial silhouette is enlarged. Hyperexpansion is consistent with emphysema. Interstitial markings are diffusely coarsened with chronic features.  No edema or focal airspace consolidation.  Pulmonary arterial prominence has decreased in the interval. Bones are diffusely demineralized. Stable appearance of left acromioclavicular space widening.  IMPRESSION: Cardiomegaly with emphysema.  No acute cardiopulmonary findings.  Original Report Authenticated By: ERIC A. MANSELL, M.D.   Ct Head Wo Contrast  04/12/2011  *RADIOLOGY REPORT*  Clinical Data: Code stroke.  Confusion with slurred speech.  CT HEAD WITHOUT CONTRAST  Technique:  Contiguous axial images were obtained from the base of the skull through the vertex without contrast.  Comparison: 03/25/2011  Findings: There is no evidence for acute hemorrhage, hydrocephalus, mass lesion, or abnormal extra-axial fluid collection.  No definite CT evidence for acute infarction.  Diffuse loss of parenchymal volume is consistent with atrophy. Patchy low attenuation in the deep hemispheric and periventricular white matter is nonspecific, but likely reflects chronic microvascular ischemic demyelination. Lacunar infarcts are seen in the basal ganglia bilaterally, as before.  The visualized paranasal sinuses and mastoid air cells are clear.  IMPRESSION: Stable.  Atrophy with chronic small vessel white matter disease and bilateral lacunar infarctions within the basal ganglia.  No acute findings.  I personally called these results to Dr. Anitra Lauth in the emergency department at 1750 hours on 04/12/2011.  Original Report Authenticated By: ERIC A. MANSELL, M.D.   Mr Brain Wo Contrast  04/13/2011  *RADIOLOGY REPORT*  Clinical Data:  Brief episode of confusion with slurred speech and right facial droop.  MRI HEAD WITHOUT CONTRAST  MRA HEAD WITHOUT CONTRAST  Technique: Multiplanar, multiecho pulse sequences of the brain and surrounding structures  were obtained according to standard protocol without intravenous contrast.  Angiographic images of the head were obtained using MRA technique without contrast.  Comparison: 04/12/2011 CT.  03/25/2011 MR. The  MRI HEAD  Findings:  No acute infarct.  Remote right paracentral pontine infarct.  Remote basal ganglia/left thalamic infarcts.  Small vessel disease type changes.  No intracranial hemorrhage.  No intracranial mass lesion detected on this unenhanced exam.  Global atrophy without hydrocephalus.  Basilar artery impresses upon the hypothalamic region.  Heterogeneous appearance of the clivus unchanged.  Cervical spondylotic changes with kyphosis C2-3 and mild spinal stenosis.  Major intracranial vascular structures are patent.  Polypoid opacification right maxillary sinus.  IMPRESSION: No acute infarct.  Please see above.  MRA HEAD  Findings: Ectasia of the distal vertical cervical segment and proximal petrous segment of the right internal carotid artery.  Mild ectasia with areas of mild narrowing and irregularity involving the cavernous of the internal carotid artery bilaterally.  Mild narrowing supraclinoid segment left internal carotid artery with mild to moderate narrowing supraclinoid segment right internal carotid artery.  High-grade focal stenosis proximal A1 segment of the left anterior cerebral artery.  Moderate focal stenosis distal A1 segment right anterior cerebral artery.  High-grade focal stenosis A2 segment of the left anterior cerebral artery.  Mild irregularity of the  M1 segment of the middle cerebral artery bilaterally.  Middle cerebral artery moderate branch vessel irregularity bilaterally.  Right vertebral artery is dominant with mild narrowing of the distal right vertebral artery.  Mild narrowing distal left vertebral artery.  Moderate narrowing proximal basilar artery.  Ectatic ectatic distal basilar artery.  Poor delineation of the PICAs and AICAs.  Moderate narrowing of the proximal right  posterior cerebral artery.  Mild to moderate posterior cerebral artery distal branch vessel irregularity bilaterally.  No aneurysm noted.  IMPRESSION: Prominent intracranial atherosclerotic type changes as detailed above.  Original Report Authenticated By: Fuller Canada, M.D.   Scheduled Meds:   . amLODipine  10 mg Oral Daily  . aspirin  325 mg Oral Daily  . calcium carbonate  1 tablet Oral BID WC  . carvedilol  20 mg Oral Daily  . docusate sodium  100 mg Oral BID  . insulin aspart  0-9 Units Subcutaneous TID WC  . levETIRAcetam  500 mg Oral BID  . olmesartan  20 mg Oral Daily  . pantoprazole  40 mg Oral Daily  . pioglitazone  30 mg Oral Daily  . pregabalin  50 mg Oral QHS  . rOPINIRole  2 mg Oral BID  . simvastatin  20 mg Oral Daily   Continuous Infusions:    Assessment/Plan: 1. Transient episode of slurred speech and right facial droop/altered mental status: MRI and MRA unremarkable. LDL at goal. EEG unremarkable. Nevertheless neurology was suspicious of seizure activity. Keppra per neurology. Recommend outpatient neurology followup. 2. History of stroke with mild residual left-sided weakness: Stable. 3. Diabetes mellitus type 2: Metformin on hold. Hemoglobin A1c 6.7. Resume metformin on discharge. 4. Hypertension: Well controlled. 5. Anemia: Stable. Followup as an outpatient.  Await 2-D echocardiogram results. Probable discharge home with home health later today or tomorrow December 13 pending results.  Code Status: Need to establish. Family Communication: None at bedside. Disposition Plan: Home with home health.   Brendia Sacks, MD  [facility] Pager [phone] 04/15/2011, 9:28 AM    LOS: 3 days

## 2011-04-15 NOTE — Progress Notes (Signed)
Subjective:  No complaints  Objective: Vital signs in last 24 hours: Temp:  [97.3 F (36.3 C)-98.4 F (36.9 C)] 98.4 F (36.9 C) (12/12 0606) Pulse Rate:  [59-67] 67  (12/12 0606) Resp:  [18-22] 22  (12/12 0606) BP: (105-150)/(67-78) 105/67 mmHg (12/12 0606) SpO2:  [89 %-100 %] 95 % (12/12 0606)  Intake/Output from previous day: 12/11 0701 - 12/12 0700 In: 360 [P.O.:360] Out: 3 [Urine:3] Intake/Output this shift:   Nutritional status: Cardiac  Past Medical History  Diagnosis Date  . Stroke   . GERD (gastroesophageal reflux disease)   . Hypertension   . Diabetes mellitus   . Blood transfusion   . Arthritis   . Anxiety   . Anemia   . GI (gastrointestinal bleed)     Due to benign mass  . Bronchitis   . Restless leg syndrome     Neurologic Exam: Mental Status: Alert, oriented, thought content appropriate.  Speech dysarthrict without evidence of aphasia. Able to follow 3 step commands without difficulty. Cranial Nerves: II-Visual fields grossly intact. III/IV/VI-  Pupils reactive bilaterally. V/VII-residual left facial droop VIII-grossly intact IX/X-normal gag XI-bilateral shoulder shrug XII-midline tongue extension Motor: 4/5 bilaterally with normal tone and bulk weaker on the left secondary to previous stroke Sensory: Pinprick and light touch intact throughout, bilaterally Deep Tendon Reflexes: 2+ and symmetric throughout Plantars: Down goingright and upgoing on the left  Lab Results: Lab Results  Component Value Date/Time   CHOL 124 04/13/2011  5:00 AM   Lipid Panel  Basename 04/13/11 0500  CHOL 124  TRIG 54  HDL 51  CHOLHDL 2.4  VLDL 11  LDLCALC 62    Studies/Results: Mr Brain Wo Contrast  04/13/2011  *RADIOLOGY REPORT*  Clinical Data:  Brief episode of confusion with slurred speech and right facial droop.  MRI HEAD WITHOUT CONTRAST  MRA HEAD WITHOUT CONTRAST  Technique: Multiplanar, multiecho pulse sequences of the brain and surrounding  structures were obtained according to standard protocol without intravenous contrast.  Angiographic images of the head were obtained using MRA technique without contrast.  Comparison: 04/12/2011 CT.  03/25/2011 MR. The  MRI HEAD  Findings:  No acute infarct.  Remote right paracentral pontine infarct.  Remote basal ganglia/left thalamic infarcts.  Small vessel disease type changes.  No intracranial hemorrhage.  No intracranial mass lesion detected on this unenhanced exam.  Global atrophy without hydrocephalus.  Basilar artery impresses upon the hypothalamic region.  Heterogeneous appearance of the clivus unchanged.  Cervical spondylotic changes with kyphosis C2-3 and mild spinal stenosis.  Major intracranial vascular structures are patent.  Polypoid opacification right maxillary sinus.  IMPRESSION: No acute infarct.  Please see above.  MRA HEAD  Findings: Ectasia of the distal vertical cervical segment and proximal petrous segment of the right internal carotid artery.  Mild ectasia with areas of mild narrowing and irregularity involving the cavernous of the internal carotid artery bilaterally.  Mild narrowing supraclinoid segment left internal carotid artery with mild to moderate narrowing supraclinoid segment right internal carotid artery.  High-grade focal stenosis proximal A1 segment of the left anterior cerebral artery.  Moderate focal stenosis distal A1 segment right anterior cerebral artery.  High-grade focal stenosis A2 segment of the left anterior cerebral artery.  Mild irregularity of the M1 segment of the middle cerebral artery bilaterally.  Middle cerebral artery moderate branch vessel irregularity bilaterally.  Right vertebral artery is dominant with mild narrowing of the distal right vertebral artery.  Mild narrowing distal left vertebral artery.  Moderate  narrowing proximal basilar artery.  Ectatic ectatic distal basilar artery.  Poor delineation of the PICAs and AICAs.  Moderate narrowing of the  proximal right posterior cerebral artery.  Mild to moderate posterior cerebral artery distal branch vessel irregularity bilaterally.  No aneurysm noted.  IMPRESSION: Prominent intracranial atherosclerotic type changes as detailed above.  Original Report Authenticated By: Fuller Canada, M.D.   Mr Mra Head/brain Wo Cm  04/13/2011  *RADIOLOGY REPORT*  Clinical Data:  Brief episode of confusion with slurred speech and right facial droop.  MRI HEAD WITHOUT CONTRAST  MRA HEAD WITHOUT CONTRAST  Technique: Multiplanar, multiecho pulse sequences of the brain and surrounding structures were obtained according to standard protocol without intravenous contrast.  Angiographic images of the head were obtained using MRA technique without contrast.  Comparison: 04/12/2011 CT.  03/25/2011 MR. The  MRI HEAD  Findings:  No acute infarct.  Remote right paracentral pontine infarct.  Remote basal ganglia/left thalamic infarcts.  Small vessel disease type changes.  No intracranial hemorrhage.  No intracranial mass lesion detected on this unenhanced exam.  Global atrophy without hydrocephalus.  Basilar artery impresses upon the hypothalamic region.  Heterogeneous appearance of the clivus unchanged.  Cervical spondylotic changes with kyphosis C2-3 and mild spinal stenosis.  Major intracranial vascular structures are patent.  Polypoid opacification right maxillary sinus.  IMPRESSION: No acute infarct.  Please see above.  MRA HEAD  Findings: Ectasia of the distal vertical cervical segment and proximal petrous segment of the right internal carotid artery.  Mild ectasia with areas of mild narrowing and irregularity involving the cavernous of the internal carotid artery bilaterally.  Mild narrowing supraclinoid segment left internal carotid artery with mild to moderate narrowing supraclinoid segment right internal carotid artery.  High-grade focal stenosis proximal A1 segment of the left anterior cerebral artery.  Moderate focal stenosis  distal A1 segment right anterior cerebral artery.  High-grade focal stenosis A2 segment of the left anterior cerebral artery.  Mild irregularity of the M1 segment of the middle cerebral artery bilaterally.  Middle cerebral artery moderate branch vessel irregularity bilaterally.  Right vertebral artery is dominant with mild narrowing of the distal right vertebral artery.  Mild narrowing distal left vertebral artery.  Moderate narrowing proximal basilar artery.  Ectatic ectatic distal basilar artery.  Poor delineation of the PICAs and AICAs.  Moderate narrowing of the proximal right posterior cerebral artery.  Mild to moderate posterior cerebral artery distal branch vessel irregularity bilaterally.  No aneurysm noted.  IMPRESSION: Prominent intracranial atherosclerotic type changes as detailed above.  Original Report Authenticated By: Fuller Canada, M.D.    Medications:  Prior to Admission:  Prescriptions prior to admission  Medication Sig Dispense Refill  . acetaminophen (TYLENOL) 650 MG CR tablet Take 650 mg by mouth every 8 (eight) hours as needed. For pain       . amlodipine-atorvastatin (CADUET) 10-10 MG per tablet Take 1 tablet by mouth daily.        Marland Kitchen aspirin EC 81 MG tablet Take 81 mg by mouth daily.        . calcium carbonate (OS-CAL) 600 MG TABS Take 600 mg by mouth 2 (two) times daily with a meal.        . carvedilol (COREG CR) 20 MG 24 hr capsule Take 20 mg by mouth daily.        Marland Kitchen docusate sodium (COLACE) 100 MG capsule Take 100 mg by mouth 2 (two) times daily.        Marland Kitchen  esomeprazole (NEXIUM) 40 MG capsule Take 40 mg by mouth daily before breakfast.        . metFORMIN (GLUCOPHAGE-XR) 750 MG 24 hr tablet Take 750 mg by mouth 2 (two) times daily.        . pioglitazone (ACTOS) 30 MG tablet Take 30 mg by mouth daily.        . pregabalin (LYRICA) 50 MG capsule Take 50 mg by mouth at bedtime.        Marland Kitchen rOPINIRole (REQUIP) 2 MG tablet Take 2 mg by mouth at bedtime.        Marland Kitchen telmisartan  (MICARDIS) 40 MG tablet Take 40 mg by mouth daily.        Scheduled:   . amLODipine  10 mg Oral Daily  . aspirin  325 mg Oral Daily  . calcium carbonate  1 tablet Oral BID WC  . carvedilol  20 mg Oral Daily  . docusate sodium  100 mg Oral BID  . insulin aspart  0-9 Units Subcutaneous TID WC  . olmesartan  20 mg Oral Daily  . pantoprazole  40 mg Oral Daily  . pioglitazone  30 mg Oral Daily  . pregabalin  50 mg Oral QHS  . rOPINIRole  2 mg Oral BID  . simvastatin  20 mg Oral Daily    Assessment/Plan:  77 YO female who presented to the hospital with AMS which has now cleared.  MRI/ MRA negative.  EEG showed no epileptiform activity, Carotid doppler negative.  The 2D echo reading is still pending.  EKG shows NSR.  Events are still suspicious for seizure activity and pos ictal state.  Will start keppra 500 mg BID.  Recommend patient continue this drug at D/C and follow up with out patient neurology.   Felicie Morn PA-C [facility] [phone]  04/15/2011, 8:56 AM

## 2011-04-15 NOTE — Progress Notes (Signed)
Physical Therapy Treatment Patient Details Name: Elizabeth Pugh MRN: [id number] DOB: May 28, 1926 Today's Date: 04/15/2011  PT Assessment/Plan  PT - Assessment/Plan Comments on Treatment Session: Pt did well with ambulation and stairs. Pt requires min assist with stairs, she reports she will have level of assist needed at home. Pt feels comfortable going home and with performing stairs at home. Pt still deconditioned. Her SpO2 was >/= 95% with stairs on 1 L [place] 02 and on room air with 40 ft ambulation. RN aware.  PT Plan: Discharge plan remains appropriate PT Frequency: Min 3X/week Follow Up Recommendations: Home health PT;24 hour supervision/assistance (? [facility] vs. HHOT per pt request) Equipment Recommended: None recommended by PT PT Goals  Acute Rehab PT Goals PT Goal Formulation: With patient Time For Goal Achievement: 2 weeks Pt will go Sit to Stand: with modified independence PT Goal: Sit to Stand - Progress: Progressing toward goal Pt will go Stand to Sit: with modified independence PT Goal: Stand to Sit - Progress: Progressing toward goal Pt will Transfer Bed to Chair/Chair to Bed: with modified independence PT Transfer Goal: Bed to Chair/Chair to Bed - Progress: Progressing toward goal Pt will Ambulate: 51 - 150 feet;with modified independence;with rolling walker PT Goal: Ambulate - Progress: Progressing toward goal Pt will Go Up / Down Stairs: 3-5 stairs;with supervision;with least restrictive assistive device PT Goal: Up/Down Stairs - Progress: Progressing toward goal  PT Treatment Precautions/Restrictions  Precautions Precautions: Fall Precaution Comments: Generalized weakness Restrictions Weight Bearing Restrictions: No Mobility (including Balance) Bed Mobility Bed Mobility: No (seated at start) Transfers Transfers: Yes Sit to Stand: From chair/3-in-1 (Min-guard assist) Sit to Stand Details (indicate cue type and reason): Performed 2x. min guard assist for  safety Stand to Sit: To chair/3-in-1;Other (comment) (min-guard assist) Stand to Sit Details: Performed 2x. min guard assist for safety Ambulation/Gait Ambulation/Gait: Yes Ambulation/Gait Assistance: 5: Supervision Ambulation/Gait Assistance Details (indicate cue type and reason): Supervision for safety, no physical assistance needed Ambulation Distance (Feet): 45 Feet Assistive device: Rolling walker Gait Pattern: Decreased stride length;Trunk flexed;Decreased stance time - left Stairs: Yes Stairs Assistance: 4: Min assist Stairs Assistance Details (indicate cue type and reason): assist secondary to some LE weakness Stair Management Technique: One rail Right;Step to pattern;Sideways Number of Stairs: 3  (ascend/descend) Height of Stairs:  (standard stairwell height)    End of Session PT - End of Session Equipment Utilized During Treatment: Gait belt Activity Tolerance: Patient tolerated treatment well;Patient limited by fatigue Patient left: in chair;with call bell in reach Nurse Communication: Mobility status for transfers;Mobility status for ambulation General Behavior During Session: [facility] for tasks performed Cognition: [name] for tasks performed  Sherie Don 04/15/2011, 4:22 PM  Sherie Don) Carleene Mains PT, DPT Acute Rehabilitation [phone]

## 2011-04-16 LAB — GLUCOSE, CAPILLARY
Glucose-Capillary: 137 mg/dL — ABNORMAL HIGH (ref 70–99)
Glucose-Capillary: 152 mg/dL — ABNORMAL HIGH (ref 70–99)
Glucose-Capillary: 196 mg/dL — ABNORMAL HIGH (ref 70–99)

## 2011-04-16 NOTE — Progress Notes (Signed)
Utilization review complete 

## 2011-04-16 NOTE — Clinical Documentation Improvement (Signed)
GENERIC DOCUMENTATION CLARIFICATION QUERY  THIS DOCUMENT IS NOT A PERMANENT PART OF THE MEDICAL RECORD  TO RESPOND TO THE THIS QUERY, FOLLOW THE INSTRUCTIONS BELOW:  1. If needed, update documentation for the patient's encounter via the notes activity.  2. Access this query again and click edit on the [employer].  3. After updating, or not, click F2 to complete all highlighted (required) fields concerning your review. Select "additional documentation in the medical record" OR "no additional documentation provided".  4. Click Sign note button.  5. The deficiency will fall out of your InBasket *Please let us know if you are not able to compete this workflow by phone or e-mail (listed below).  Please update your documentation within the medical record to reflect your response to this query.                                                                                        04/16/11   Dear Dr. [facility],  In a better effort to capture your patient's severity of illness, reflect appropriate length of stay and utilization of resources, a review of the patient medical record has revealed the following indicators.    Based on your clinical judgment, please clarify and document in a progress note and/or discharge summary the clinical condition associated with the following supporting information:  In responding to this query please exercise your independent judgment.  The fact that a query is asked, does not imply that any particular answer is desired or expected.  In a better effort to capture your patient's severity of illness and for coding purposes.  Please document a probable ,possible, suspected or known diagnosis/condition (after careful study) that required admission or treatment. "AMS" represents a symptom rather than a condition.  Potential Diagnosis:  _______New onset of Seizures  _______Other Condition__________________  _______Cannot Clinically  Determine   Supporting Information:  "History of event is still suspicious for seizure activity with post ictal state"per Neurology  Signs & Symptoms:  "Today around 5:30 PM at home patient had a brief episode when patient got confused, slurred speech and family felt patient may have had right facial droop. Patient did not lose function of her upper or lower extremity".   Treatment  Keppra 500mg  po BID   You may use possible, probable, or suspect with inpatient documentation. possible, probable, suspected diagnoses MUST be documented at the time of discharge  Reviewed: additional documentation in the medical record  Thank You,   Rossie Muskrat RN, BSN Clinical Documentation Specialist Pager:  [phone] [email or web address]  Health Information Management [place]

## 2011-04-16 NOTE — Progress Notes (Signed)
PROGRESS NOTE  KANAE Pugh AVW:[id number] DOB: 1926/10/25 DOA: 04/12/2011 PCP: No primary provider on file. Cardiologist: Dr. Sharyn Lull  Brief narrative: 77 year old woman recently discharged November of this year (see below) who presented to the hospital with speech difficulty and right-sided facial droop. No TPA was given secondary to low NIHSS. This was her second presentation with similar symptoms, the first being in November. She was seen by neurology in the emergency department and admitted for further evaluation.  Admitted November of this year for confusion, slurred speech and transient facial droop. Was seen in consultation with neurology. MRI was negative for stroke. There was concern for seizure and EEG was obtained. EEG was unremarkable. There was concern for arrhythmia on EEG however this was not seen on telemetry. Outpatient Holter monitor was considered through her cardiologist Dr. Sharyn Lull. Final diagnosis: Syncope with altered mental status.  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 Increase aspirin to 325 mg by mouth daily. Telemetry and 2-D echocardiogram. Carotid Dopplers. EEG.  Physical therapy: Home health physical therapy, possible home health aide. 24 hour supervision/assistance. No equipment recommended. Home health OT eval recommended.  Occupational therapy: Home health.  Speech-language pathology: No further evaluation recommended. Speech/language and cognition within functional limits.  Procedures:  December 10: EEG: Normal EEG.  December 10: Bilateral Carotid Doppler: No evidence of significant internal carotid artery stenosis. Vertebral artery flow antegrade.  December 10: 2-D echocardiogram: Results pending.  12/08/2010:  Nuclear stress test: Negative.  Interim History: Interim documentation reviewed. Subjective: Feels better. No spells. No complaints.  Objective: Filed Vitals:   04/15/11 1731 04/15/11 2135 04/16/11 0552 04/16/11 1400  BP: 120/75 126/55 138/73 122/61  Pulse: 73 67 69 60  Temp: 97.9 F (36.6 C) 98.3 F (36.8 C) 97.5 F (36.4 C) 97.4 F (36.3 C)  TempSrc: Oral Oral Oral Oral  Resp: 19 20 21 20   Height:      Weight:      SpO2: 97% 100% 100% 97%    Intake/Output Summary (Last 24 hours) at 04/16/11 1750 Last data filed at 04/16/11 1300  Gross per 24 hour  Intake    600 ml  Output    502 ml  Net     98 ml    Exam:  General: Appears well.  Cardiovascular: Regular rate and rhythm. No murmur, rub or gallop. No lower extremity edema. Respiratory: Clear to auscultation bilaterally. No wheezes, rales or rhonchi. Normal respiratory effort. Neurologic: Grossly normal.  Data Reviewed: Basic Metabolic Panel:  Lab 04/15/11 8295 04/14/11 0632 04/13/11 0500 04/12/11 1738  NA 136 140 142 136  K 4.6 4.1 -- --  CL 104 106 108 103  CO2 27 26 28 25   GLUCOSE 216* 183* 135* 160*  BUN 28* 21 26* 24*  CREATININE 0.89 0.68 0.76 0.83  CALCIUM 9.4 8.6 9.5 10.2  MG -- -- 1.6 --  PHOS -- -- -- --   CBC:  Lab 04/15/11 0540 04/14/11 0632 04/13/11 0500 04/12/11 1738  WBC 4.2 3.0* 3.0* 4.4  NEUTROABS -- -- -- 2.7  HGB 9.6* 10.1* 9.8* 9.6*  HCT 30.5* 32.0* 30.8* 31.2*  MCV 92.7 92.5 92.5 92.3  PLT 184 177 158 154   CBG:  Lab 04/16/11 1724 04/16/11 1207 04/16/11 0755 04/15/11 2139 04/15/11 1730  GLUCAP 173* 196* 152* 221* 160*  Scheduled Meds:    . amLODipine  10 mg Oral Daily  . aspirin  325 mg Oral Daily  . calcium carbonate  1 tablet Oral BID WC  . carvedilol  20 mg Oral Daily  . docusate sodium  100 mg Oral BID  . insulin aspart  0-9 Units Subcutaneous TID WC  . levETIRAcetam  500 mg Oral BID  . olmesartan  20 mg Oral Daily  . pantoprazole  40 mg Oral Daily  .  pioglitazone  30 mg Oral Daily  . pregabalin  50 mg Oral QHS  . rOPINIRole  2 mg Oral BID  . simvastatin  20 mg Oral Daily   Continuous Infusions:    Assessment/Plan: 1. Transient episode of slurred speech and right facial droop/altered mental status: MRI and MRA unremarkable. LDL at goal. EEG unremarkable. Nevertheless neurology was suspicious of seizure activity. Keppra per neurology. Recommend outpatient neurology followup. 2. History of stroke with mild residual left-sided weakness: Stable. 3. Diabetes mellitus type 2: Metformin on hold. Hemoglobin A1c 6.7. Resume metformin on discharge. 4. Hypertension: Well controlled. 5. Anemia: Stable. Followup as an outpatient.  Await 2-D echocardiogram results. I anticipate this being read this evening.  Code Status: Need to establish.  Disposition Plan: Home with home health.   Brendia Sacks, MD  [facility] Pager [phone] 04/16/2011, 5:50 PM    LOS: 4 days

## 2011-04-16 NOTE — Progress Notes (Signed)
   CARE MANAGEMENT NOTE 04/16/2011  Patient:  TYREONNA, CZAPLICKI   Account Number:  [id number]  Date Initiated:  04/14/2011  Documentation initiated by:  [facility]  Subjective/Objective Assessment:   TIA, slurred speech, seizures. Lives with daughter, uses cane and walker.     Action/Plan:   PT eval-recommending HHPT/OT   Anticipated DC Date:  04/16/2011   Anticipated DC Plan:  HOME W HOME HEALTH SERVICES      DC Planning Services  CM consult      Choice offered to / List presented to:  C-1 Patient        HH arranged  HH-2 PT  HH-3 OT      HH agency  [facility]   Status of service:  In process, will continue to follow Medicare Important Message given?   (If response is "NO", the following Medicare IM given date fields will be blank) Date Medicare IM given:   Date Additional Medicare IM given:    Discharge Disposition:    Per UR Regulation:  Reviewed for med. necessity/level of care/duration of stay  Comments:  04/14/2011 1430 Utilization review complete. Isidoro Donning RN CCM Case Mgmt phone [phone]  04/16/11 Spoke with patient about HHC for HHPT and OT. [employer] agencies list. Patient chose [facility]. Contacted Tiffany at [facility] and set up HHPT and HHOT.Jacquelynn Cree RN, BSN, CCM

## 2011-04-16 NOTE — Progress Notes (Addendum)
Subjective: No complaints  Objective: Vital signs in last 24 hours: Temp:  [97.5 F (36.4 C)-98.3 F (36.8 C)] 97.5 F (36.4 C) (12/13 0552) Pulse Rate:  [67-73] 69  (12/13 0552) Resp:  [18-22] 21  (12/13 0552) BP: (120-138)/(53-75) 138/73 mmHg (12/13 0552) SpO2:  [94 %-100 %] 100 % (12/13 0552)  Intake/Output from previous day: 12/12 0701 - 12/13 0700 In: 720 [P.O.:720] Out: 2 [Urine:2] Intake/Output this shift: Total I/O In: 360 [P.O.:360] Out: -  Nutritional status: Cardiac  Past Medical History  Diagnosis Date  . Stroke   . GERD (gastroesophageal reflux disease)   . Hypertension   . Diabetes mellitus   . Blood transfusion   . Arthritis   . Anxiety   . Anemia   . GI (gastrointestinal bleed)     Due to benign mass  . Bronchitis   . Restless leg syndrome     Neurologic Exam: Alert, oriented, thought content appropriate. Speech dysarthrict without evidence of aphasia. Able to follow 3 step commands without difficulty.  Cranial Nerves:  II-Visual fields grossly intact.  III/IV/VI- Pupils reactive bilaterally.  V/VII-residual left facial droop  VIII-grossly intact  IX/X-normal gag  XI-bilateral shoulder shrug  XII-midline tongue extension  Motor: 4/5 bilaterally with normal tone and bulk weaker on the left secondary to previous stroke  Sensory: Pinprick and light touch intact throughout, bilaterally  Deep Tendon Reflexes: 2+ and symmetric throughout  Plantars: Down goingright and upgoing on the left  Lab Results: Lab Results  Component Value Date/Time   CHOL 124 04/13/2011  5:00 AM   Lipid Panel No results found for this basename: CHOL,TRIG,HDL,CHOLHDL,VLDL,LDLCALC in the last 72 hours  Studies/Results: No results found.  Medications:  Scheduled:   . amLODipine  10 mg Oral Daily  . aspirin  325 mg Oral Daily  . calcium carbonate  1 tablet Oral BID WC  . carvedilol  20 mg Oral Daily  . docusate sodium  100 mg Oral BID  . insulin aspart  0-9 Units  Subcutaneous TID WC  . levETIRAcetam  500 mg Oral BID  . olmesartan  20 mg Oral Daily  . pantoprazole  40 mg Oral Daily  . pioglitazone  30 mg Oral Daily  . pregabalin  50 mg Oral QHS  . rOPINIRole  2 mg Oral BID  . simvastatin  20 mg Oral Daily    Assessment/Plan: 77 YO female who presented to hospital with AMS now fully cleared.  MRI (-), EEG (-) 2 Decho was ordered 04/12/11 however the results are still pending.  Started on Keppra 500 BID without adverse affects.    Recommend continue Keppra and follow up out patient neurology when D/C.   Neuro S/O   Felicie Morn PA-C [facility] [phone]  04/16/2011, 9:18 AM

## 2011-04-16 NOTE — Progress Notes (Signed)
Occupational Therapy Treatment Patient Details Name: Elizabeth Pugh MRN: [id number] DOB: [birth year] Today's Date: 04/16/2011  OT Assessment/Plan OT Assessment/Plan Comments on Treatment Session: Pts OT goals are met.  She is at her baseline in ADL and mobility. OT Plan: Discharge plan remains appropriate OT Frequency: Min 2X/week Follow Up Recommendations: Home health OT Equipment Recommended: None recommended by OT OT Goals Acute Rehab OT Goals Time For Goal Achievement: 7 days ADL Goals Pt Will Perform Grooming: with set-up;with supervision;Standing at sink ADL Goal: Grooming - Progress: Met Pt Will Perform Lower Body Bathing: with min assist;Sitting, edge of bed;Sit to stand from bed ADL Goal: Lower Body Bathing - Progress: Met Pt Will Perform Lower Body Dressing: with min assist;Sitting, bed;Sit to stand from bed ADL Goal: Lower Body Dressing - Progress: Met Pt Will Transfer to Toilet: with supervision;with DME ADL Goal: Toilet Transfer - Progress: Met Pt Will Perform Toileting - Clothing Manipulation: with supervision;Standing ADL Goal: Toileting - Clothing Manipulation - Progress: Met  OT Treatment Precautions/Restrictions  Precautions Precautions: Fall Restrictions Weight Bearing Restrictions: No   ADL ADL Grooming: Supervision/safety;Wash/dry hands;Brushing hair;Performed Where Assessed - Grooming: Standing at sink Lower Body Bathing: Performed;Set up Where Assessed - Lower Body Bathing: Sitting, chair Lower Body Dressing: Performed;Set up Lower Body Dressing Details (indicate cue type and reason): doffing/donning socks Where Assessed - Lower Body Dressing: Sitting, chair Toilet Transfer: Performed;Supervision/safety Toilet Transfer Method: Ambulating Toilet Transfer Equipment: Extra wide bedside commode Toileting - Clothing Manipulation: Supervision/safety;Performed Where Assessed - Toileting Clothing Manipulation: Sit to stand from 3-in-1 or  toilet Toileting - Hygiene: Performed;Set up;Supervision/safety Where Assessed - Toileting Hygiene: Sit on 3-in-1 or toilet Equipment Used: Rolling walker Mobility   Ambulating with RW and supervision.  End of Session OT - End of Session Equipment Utilized During Treatment: Gait belt Activity Tolerance: Patient tolerated treatment well Patient left: in chair;with call bell in reach General Behavior During Session: [facility] for tasks performed Cognition: [facility] for tasks performed  Evern Bio  04/16/2011, 10:40 AM [phone]

## 2011-04-16 NOTE — Progress Notes (Signed)
OT NOTE:  OT goals are met.  Pt is at a supervision level in ADL and ADL mobility.  No further acute OT needs.  Recommend return home with daughter's assist.   04/16/2011 Martie Round, OTR/L Pager: [phone]

## 2011-04-17 LAB — GLUCOSE, CAPILLARY: Glucose-Capillary: 167 mg/dL — ABNORMAL HIGH (ref 70–99)

## 2011-04-17 MED ORDER — ASPIRIN 325 MG PO TABS: 325.0000 mg | ORAL_TABLET | Freq: Every day | ORAL | Status: DC

## 2011-04-17 MED ORDER — LEVETIRACETAM 500 MG PO TABS: 500.0000 mg | ORAL_TABLET | Freq: Two times a day (BID) | ORAL | Status: DC

## 2011-04-17 NOTE — Progress Notes (Signed)
Pt complained of pain in IV last night. It appeared red and slightly swollen. Removed IV; pt refused new IV.  Idalia Needle RN

## 2011-04-17 NOTE — Progress Notes (Signed)
PROGRESS NOTE  Elizabeth Pugh ZOX:[id number] DOB: 08/22/1926 DOA: 04/12/2011 PCP: No primary provider on file. Cardiologist: Dr. Sharyn Lull  Brief narrative: 77 year old woman recently discharged November of this year (see below) who presented to the hospital with speech difficulty and right-sided facial droop. No TPA was given secondary to low NIHSS. This was her second presentation with similar symptoms, the first being in November. She was seen by neurology in the emergency department and admitted for further evaluation.  Admitted November of this year for confusion, slurred speech and transient facial droop. Was seen in consultation with neurology. MRI was negative for stroke. There was concern for seizure and EEG was obtained. EEG was unremarkable. There was concern for arrhythmia on EEG however this was not seen on telemetry. Outpatient Holter monitor was considered through her cardiologist Dr. Sharyn Lull. Final diagnosis: Syncope with altered mental status.  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 Increase aspirin to 325 mg by mouth daily. Telemetry and 2-D echocardiogram. Carotid Dopplers. EEG.  Physical therapy: Home health physical therapy, possible home health aide. 24 hour supervision/assistance. No equipment recommended. Home health OT eval recommended.  Occupational therapy: Home health.  Speech-language pathology: No further evaluation recommended. Speech/language and cognition within functional limits.  Procedures:  December 10: EEG: Normal EEG.  December 10: Bilateral Carotid Doppler: No evidence of significant internal carotid artery stenosis. Vertebral artery flow antegrade.  December 10: 2-D echocardiogram: Normal systolic function. Wall  motion normal. No regional wall motion abnormalities. Grade 1 diastolic dysfunction.  12/08/2010: Nuclear stress test: Negative.  Interim History: Interim documentation reviewed. IV removed. Subjective: No complaints today.  Objective: Filed Vitals:   04/16/11 2100 04/17/11 0501 04/17/11 1009 04/17/11 1329  BP: 145/75 135/75 158/78 144/74  Pulse: 59 59  60  Temp: 97.8 F (36.6 C) 97.8 F (36.6 C)  97.5 F (36.4 C)  TempSrc: Oral   Oral  Resp:  20  18  Height:      Weight:      SpO2: 99% 98%  98%    Intake/Output Summary (Last 24 hours) at 04/17/11 1347 Last data filed at 04/17/11 1008  Gross per 24 hour  Intake    480 ml  Output      2 ml  Net    478 ml    Exam:  General: Appears well.  Cardiovascular: Regular rate and rhythm. No murmur, rub or gallop. No lower extremity edema. Respiratory: Clear to auscultation bilaterally. No wheezes, rales or rhonchi. Normal respiratory effort. Neurologic: Grossly normal.  Data Reviewed: CBG:  Lab 04/17/11 1213 04/17/11 0722 04/16/11 2335 04/16/11 1724 04/16/11 1207  GLUCAP 151* 167* 137* 173* 196*   Scheduled Meds:    . amLODipine  10 mg Oral Daily  . aspirin  325 mg Oral Daily  . calcium carbonate  1 tablet Oral BID WC  . carvedilol  20 mg Oral Daily  . docusate sodium  100 mg Oral BID  . insulin aspart  0-9 Units Subcutaneous TID WC  . levETIRAcetam  500 mg Oral BID  . olmesartan  20 mg Oral Daily  . pantoprazole  40 mg Oral Daily  . pioglitazone  30 mg Oral Daily  . pregabalin  50 mg Oral QHS  . rOPINIRole  2 mg Oral BID  . simvastatin  20 mg Oral  Daily   Continuous Infusions:    Assessment/Plan: 1. Transient episode of slurred speech and right facial droop/altered mental status: MRI and MRA unremarkable. LDL at goal. EEG unremarkable. 2-D echocardiogram unremarkable. Nevertheless neurology was suspicious of seizure activity. Keppra per neurology. Recommend outpatient neurology followup. 2. Presumed seizure  disorder: New diagnosis. Plan as above. 3. History of stroke with mild residual left-sided weakness: Stable. 4. Diabetes mellitus type 2: Metformin on hold. Hemoglobin A1c 6.7. Resume metformin on discharge. 5. Hypertension: Well controlled. 6. Anemia: Stable. Followup as an outpatient.  Disposition Plan: Home with home health today.   Brendia Sacks, MD  [facility] Pager [phone] 04/17/2011, 1:47 PM    LOS: 5 days

## 2011-04-17 NOTE — Discharge Summary (Signed)
Physician Discharge Summary  Elizabeth Pugh:[id number] DOB: June 03, 1926 DOA: 04/12/2011  PCP: Dorrene German, MD Cardiologist: Rinaldo Cloud, MD Neurologist: Patient will be establishing with Denton Meek, M.D.  Admit date: 04/12/2011 Discharge date: 04/17/2011  Discharge Diagnoses:  1. Transient episode of slurred speech and right facial droop 2. Presumed seizure disorder, new diagnosis 3. History of stroke with mild residual left-sided weakness, stable 4. Diabetes mellitus type 2, stable 5. Hypertension, stable  Discharge Condition: Improved  Disposition: Home with home health physical and occupational therapy  History of present illness:  77 year old woman recently discharged November of this year (see below) who presented to the hospital with speech difficulty and right-sided facial droop. No tPA was given secondary to low NIHSS. This was her second presentation with similar symptoms, the first being in November. She was seen by neurology in the emergency department and admitted for further evaluation.  Admitted November of this year for confusion, slurred speech and transient facial droop. Was seen in consultation with neurology. MRI was negative for stroke at that time in November. There was concern for seizure and EEG was obtained. EEG was unremarkable. There was concern for arrhythmia on EEG however this was not seen on telemetry. Outpatient Holter monitor was considered through her cardiologist Dr. Sharyn Lull. Final diagnosis: Syncope with altered mental status in November (prior hospitalization).  Hospital Course:  77 year old woman who presented with a transient episode of slurred speech and right facial droop. She was seen in consultation with neurology and further workup was pursued. Repeat MRI of the brain was unremarkable in repeat EEG was unremarkable. Bilateral carotid ultrasound and 2-D echocardiogram also are both unremarkable. Nevertheless neurology had a high index of  suspicion for seizure activity and Keppra was started empirically. Patient said no further events and cells be stable for discharge. She will followup with neurology as an outpatient. Her chronic comorbidities including history of stroke, diabetes mellitus and hypertension remained stable.  Consultants:  Neurology: Increased aspirin to 325 mg by mouth daily. Started empiric Keppra.   Physical therapy: Home health physical therapy, possible home health aide. 24 hour supervision/assistance. No equipment recommended. Home health OT eval recommended.   Occupational therapy: Home health.   Speech-language pathology: No further evaluation recommended. Speech/language and cognition within functional limits.  Procedures:  December 10: EEG: Normal EEG.   December 10: Bilateral Carotid Doppler: No evidence of significant internal carotid artery stenosis. Vertebral artery flow antegrade.   December 10: 2-D echocardiogram: Normal systolic function. Wall motion normal. No regional wall motion abnormalities. Grade 1 diastolic dysfunction.   12/08/2010: Nuclear stress test: Negative.  Discharge Instructions  Discharge Orders    Future Appointments: Provider: Department: Dept Phone: Center:   05/29/2011 10:30 AM Denton Meek, MD [facility] [phone] None     Future Orders Please Complete By Expires   Diet - low sodium heart healthy      Diet Carb Modified      Increase activity slowly        Current Discharge Medication List    START taking these medications   Details  aspirin 325 MG tablet Take 1 tablet (325 mg total) by mouth daily.    levETIRAcetam (KEPPRA) 500 MG tablet Take 1 tablet (500 mg total) by mouth 2 (two) times daily. Qty: 60 tablet, Refills: 1      CONTINUE these medications which have NOT CHANGED   Details  acetaminophen (TYLENOL) 650 MG CR tablet Take 650 mg by mouth every 8 (eight) hours as needed.  For pain     amlodipine-atorvastatin (CADUET) 10-10 MG per  tablet Take 1 tablet by mouth daily.      calcium carbonate (OS-CAL) 600 MG TABS Take 600 mg by mouth 2 (two) times daily with a meal.      carvedilol (COREG CR) 20 MG 24 hr capsule Take 20 mg by mouth daily.      docusate sodium (COLACE) 100 MG capsule Take 100 mg by mouth 2 (two) times daily.      esomeprazole (NEXIUM) 40 MG capsule Take 40 mg by mouth daily before breakfast.      metFORMIN (GLUCOPHAGE-XR) 750 MG 24 hr tablet Take 750 mg by mouth 2 (two) times daily.      pioglitazone (ACTOS) 30 MG tablet Take 30 mg by mouth daily.      pregabalin (LYRICA) 50 MG capsule Take 50 mg by mouth at bedtime.      rOPINIRole (REQUIP) 2 MG tablet Take 2 mg by mouth at bedtime.      telmisartan (MICARDIS) 40 MG tablet Take 40 mg by mouth daily.       STOP taking these medications     aspirin EC 81 MG tablet        Follow-up Information    Follow up with Robynn Pane, MD in 2 weeks.   Contact information:   [address] [place] [address] [phone]       Follow up with Denton Meek, MD on 05/29/2011. (10:30 AM)    Contact information:   [address] [facility] [place] [address] [phone]           The results of significant diagnostics from this hospitalization (including imaging, microbiology, ancillary and laboratory) are listed below for reference.    Significant Diagnostic Studies: Dg Chest 2 View  04/13/2011  *RADIOLOGY REPORT*  Clinical Data: Stroke  CHEST - 2 VIEW  Comparison: 03/25/2011  Findings: The cardiopericardial silhouette is enlarged. Hyperexpansion is consistent with emphysema. Interstitial markings are diffusely coarsened with chronic features.  No edema or focal airspace consolidation.  Pulmonary arterial prominence has decreased in the interval. Bones are diffusely demineralized. Stable appearance of left acromioclavicular space widening.  IMPRESSION: Cardiomegaly with emphysema.  No acute  cardiopulmonary findings.  Original Report Authenticated By: ERIC A. MANSELL, M.D.   Ct Head Wo Contrast  04/12/2011  *RADIOLOGY REPORT*  Clinical Data: Code stroke.  Confusion with slurred speech.  CT HEAD WITHOUT CONTRAST  Technique:  Contiguous axial images were obtained from the base of the skull through the vertex without contrast.  Comparison: 03/25/2011  Findings: There is no evidence for acute hemorrhage, hydrocephalus, mass lesion, or abnormal extra-axial fluid collection.  No definite CT evidence for acute infarction.  Diffuse loss of parenchymal volume is consistent with atrophy. Patchy low attenuation in the deep hemispheric and periventricular white matter is nonspecific, but likely reflects chronic microvascular ischemic demyelination. Lacunar infarcts are seen in the basal ganglia bilaterally, as before.  The visualized paranasal sinuses and mastoid air cells are clear.  IMPRESSION: Stable.  Atrophy with chronic small vessel white matter disease and bilateral lacunar infarctions within the basal ganglia.  No acute findings.  I personally called these results to Dr. Anitra Lauth in the emergency department at 1750 hours on 04/12/2011.  Original Report Authenticated By: ERIC A. MANSELL, M.D.   Mr Maxine Glenn Head/brain Wo Cm  04/13/2011  *RADIOLOGY REPORT*  Clinical Data:  Brief episode of confusion with slurred speech and  right facial droop.  MRI HEAD WITHOUT CONTRAST  MRA HEAD WITHOUT CONTRAST  Technique: Multiplanar, multiecho pulse sequences of the brain and surrounding structures were obtained according to standard protocol without intravenous contrast.  Angiographic images of the head were obtained using MRA technique without contrast.  Comparison: 04/12/2011 CT.  03/25/2011 MR. The  MRI HEAD  Findings:  No acute infarct.  Remote right paracentral pontine infarct.  Remote basal ganglia/left thalamic infarcts.  Small vessel disease type changes.  No intracranial hemorrhage.  No intracranial mass lesion  detected on this unenhanced exam.  Global atrophy without hydrocephalus.  Basilar artery impresses upon the hypothalamic region.  Heterogeneous appearance of the clivus unchanged.  Cervical spondylotic changes with kyphosis C2-3 and mild spinal stenosis.  Major intracranial vascular structures are patent.  Polypoid opacification right maxillary sinus.  IMPRESSION: No acute infarct.  Please see above.  MRA HEAD  Findings: Ectasia of the distal vertical cervical segment and proximal petrous segment of the right internal carotid artery.  Mild ectasia with areas of mild narrowing and irregularity involving the cavernous of the internal carotid artery bilaterally.  Mild narrowing supraclinoid segment left internal carotid artery with mild to moderate narrowing supraclinoid segment right internal carotid artery.  High-grade focal stenosis proximal A1 segment of the left anterior cerebral artery.  Moderate focal stenosis distal A1 segment right anterior cerebral artery.  High-grade focal stenosis A2 segment of the left anterior cerebral artery.  Mild irregularity of the M1 segment of the middle cerebral artery bilaterally.  Middle cerebral artery moderate branch vessel irregularity bilaterally.  Right vertebral artery is dominant with mild narrowing of the distal right vertebral artery.  Mild narrowing distal left vertebral artery.  Moderate narrowing proximal basilar artery.  Ectatic ectatic distal basilar artery.  Poor delineation of the PICAs and AICAs.  Moderate narrowing of the proximal right posterior cerebral artery.  Mild to moderate posterior cerebral artery distal branch vessel irregularity bilaterally.  No aneurysm noted.  IMPRESSION: Prominent intracranial atherosclerotic type changes as detailed above.  Original Report Authenticated By: Fuller Canada, M.D.   Labs: Basic Metabolic Panel:  Lab 04/15/11 9604 04/14/11 0632 04/13/11 0500 04/12/11 1738  NA 136 140 142 136  K 4.6 4.1 -- --  CL 104 106 108  103  CO2 27 26 28 25   GLUCOSE 216* 183* 135* 160*  BUN 28* 21 26* 24*  CREATININE 0.89 0.68 0.76 0.83  CALCIUM 9.4 8.6 9.5 10.2  MG -- -- 1.6 --  PHOS -- -- -- --   Liver Function Tests:  Lab 04/13/11 0500 04/12/11 1738  AST 10 11  ALT 5 6  ALKPHOS 52 58  BILITOT 0.5 0.8  PROT 6.1 6.7  ALBUMIN 3.2* 3.7   CBC:  Lab 04/15/11 0540 04/14/11 0632 04/13/11 0500 04/12/11 1738  WBC 4.2 3.0* 3.0* 4.4  NEUTROABS -- -- -- 2.7  HGB 9.6* 10.1* 9.8* 9.6*  HCT 30.5* 32.0* 30.8* 31.2*  MCV 92.7 92.5 92.5 92.3  PLT 184 177 158 154   Cardiac Enzymes:  Lab 04/12/11 1739  CKTOTAL 47  CKMB 2.0  CKMBINDEX --  TROPONINI <0.30   CBG:  Lab 04/17/11 1213 04/17/11 0722 04/16/11 2335 04/16/11 1724 04/16/11 1207  GLUCAP 151* 167* 137* 173* 196*    Time coordinating discharge:  Signed: 25 minutes.  Brendia Sacks, MD  [facility] 04/17/2011, 2:29 PM

## 2011-04-17 NOTE — Progress Notes (Signed)
04/17/11 Nsg 1515 Patient discharged to home with family, discharged instructions given and reviewed with patient.  Patient verbalized understanding, care notes given for new meds. Skin intact at discharge, except for blister to left hip.  Patient escorted to car via wheelchair by NT.  Forbes Cellar, RN

## 2011-05-09 ENCOUNTER — Emergency Department (HOSPITAL_COMMUNITY)
Admission: EM | Admit: 2011-05-09 | Discharge: 2011-05-09 | Disposition: A | Discharge status: Home / Self Care | Payer: Medicare Other | Attending: Emergency Medicine | Admitting: Emergency Medicine

## 2011-05-09 ENCOUNTER — Encounter (HOSPITAL_COMMUNITY): Payer: Self-pay

## 2011-05-09 DIAGNOSIS — G40909 Epilepsy, unspecified, not intractable, without status epilepticus: Secondary | ICD-10-CM | POA: Insufficient documentation

## 2011-05-09 DIAGNOSIS — M129 Arthropathy, unspecified: Secondary | ICD-10-CM | POA: Insufficient documentation

## 2011-05-09 DIAGNOSIS — F29 Unspecified psychosis not due to a substance or known physiological condition: Secondary | ICD-10-CM | POA: Insufficient documentation

## 2011-05-09 DIAGNOSIS — Z79899 Other long term (current) drug therapy: Secondary | ICD-10-CM | POA: Insufficient documentation

## 2011-05-09 DIAGNOSIS — K219 Gastro-esophageal reflux disease without esophagitis: Secondary | ICD-10-CM | POA: Insufficient documentation

## 2011-05-09 DIAGNOSIS — Z8673 Personal history of transient ischemic attack (TIA), and cerebral infarction without residual deficits: Secondary | ICD-10-CM | POA: Insufficient documentation

## 2011-05-09 DIAGNOSIS — I10 Essential (primary) hypertension: Secondary | ICD-10-CM | POA: Insufficient documentation

## 2011-05-09 DIAGNOSIS — R569 Unspecified convulsions: Secondary | ICD-10-CM

## 2011-05-09 DIAGNOSIS — R404 Transient alteration of awareness: Secondary | ICD-10-CM | POA: Insufficient documentation

## 2011-05-09 DIAGNOSIS — E119 Type 2 diabetes mellitus without complications: Secondary | ICD-10-CM | POA: Insufficient documentation

## 2011-05-09 LAB — CBC
HCT: 31.5 % — ABNORMAL LOW (ref 36.0–46.0)
Hemoglobin: 10.1 g/dL — ABNORMAL LOW (ref 12.0–15.0)
RBC: 3.44 MIL/uL — ABNORMAL LOW (ref 3.87–5.11)
RDW: 14.6 % (ref 11.5–15.5)
WBC: 3.1 10*3/uL — ABNORMAL LOW (ref 4.0–10.5)

## 2011-05-09 LAB — URINALYSIS, ROUTINE W REFLEX MICROSCOPIC
Glucose, UA: NEGATIVE mg/dL
pH: 5.5 (ref 5.0–8.0)

## 2011-05-09 LAB — DIFFERENTIAL
Basophils Absolute: 0 10*3/uL (ref 0.0–0.1)
Lymphocytes Relative: 41 % (ref 12–46)
Lymphs Abs: 1.3 10*3/uL (ref 0.7–4.0)
Monocytes Absolute: 0.3 10*3/uL (ref 0.1–1.0)
Neutro Abs: 1.5 10*3/uL — ABNORMAL LOW (ref 1.7–7.7)

## 2011-05-09 LAB — URINE MICROSCOPIC-ADD ON

## 2011-05-09 LAB — GLUCOSE, CAPILLARY: Glucose-Capillary: 127 mg/dL — ABNORMAL HIGH (ref 70–99)

## 2011-05-09 LAB — BASIC METABOLIC PANEL
CO2: 27 mEq/L (ref 19–32)
Chloride: 101 mEq/L (ref 96–112)
Creatinine, Ser: 1.12 mg/dL — ABNORMAL HIGH (ref 0.50–1.10)
Sodium: 137 mEq/L (ref 135–145)

## 2011-05-09 NOTE — ED Notes (Signed)
A/O x3, NAD, daughter at [facility].  NO verbal complaints at this time.

## 2011-05-09 NOTE — ED Provider Notes (Signed)
History     CSN: [id number]  Arrival date & time 05/09/11  1352   First MD Initiated Contact with Patient 05/09/11 1353      Chief Complaint  Patient presents with  . Seizures    (Consider location/radiation/quality/duration/timing/severity/associated sxs/prior treatment) HPI Comments: Initially confused after the seizure however by the time patient arrived in the emergency room she was back to baseline  Patient is a 78 y.o. female presenting with seizures. The history is provided by the patient and the EMS personnel.  Seizures  This is a recurrent problem. The current episode started less than 1 hour ago. There was 1 seizure. The most recent episode lasted 2 to 5 minutes. Associated symptoms include sleepiness and confusion. Characteristics include rhythmic jerking and loss of consciousness. Characteristics do not include bowel incontinence, bladder incontinence or bit tongue. The episode was witnessed. There was no sensation of an aura present. The seizures did not continue in the ED. The seizure(s) had no focality. Possible causes do not include med or dosage change, sleep deprivation, missed seizure meds or recent illness. There has been no fever. There were no medications administered prior to arrival.    Past Medical History  Diagnosis Date  . Stroke   . GERD (gastroesophageal reflux disease)   . Hypertension   . Diabetes mellitus   . Blood transfusion   . Arthritis   . Anxiety   . Anemia   . GI (gastrointestinal bleed)     Due to benign mass  . Bronchitis   . Restless leg syndrome     Past Surgical History  Procedure Date  . Eye surgery     Cataract removal  . Abdominal hysterectomy   . Tubal ligation     Family History  Problem Relation Age of Onset  . Aneurysm Mother   . Heart attack Father   . Heart failure Sister   . Asthma Sister   . Kidney failure Sister   . Stroke Sister     History  Substance Use Topics  . Smoking status: Former Smoker   Types: Cigarettes  . Smokeless tobacco: Never Used  . Alcohol Use: No    OB History    Grav Para Term Preterm Abortions TAB SAB Ect Mult Living                  Review of Systems  Gastrointestinal: Negative for bowel incontinence.  Genitourinary: Negative for bladder incontinence.  Neurological: Positive for seizures and loss of consciousness.  Psychiatric/Behavioral: Positive for confusion.  All other systems reviewed and are negative.    Allergies  Penicillins and Zithromax  Home Medications   Current Outpatient Rx  Name Route Sig Dispense Refill  . ACETAMINOPHEN ER 650 MG PO TBCR Oral Take 650 mg by mouth every 8 (eight) hours as needed. For pain     . AMLODIPINE-ATORVASTATIN 10-10 MG PO TABS Oral Take 1 tablet by mouth daily.      . ASPIRIN 325 MG PO TABS Oral Take 1 tablet (325 mg total) by mouth daily.    Marland Kitchen CALCIUM CARBONATE 600 MG PO TABS Oral Take 600 mg by mouth 2 (two) times daily with a meal.      . CARVEDILOL PHOSPHATE ER 20 MG PO CP24 Oral Take 20 mg by mouth daily.      Marland Kitchen DOCUSATE SODIUM 100 MG PO CAPS Oral Take 100 mg by mouth 2 (two) times daily.      Marland Kitchen ESOMEPRAZOLE MAGNESIUM 40 MG PO  CPDR Oral Take 40 mg by mouth daily before breakfast.      . LEVETIRACETAM 500 MG PO TABS Oral Take 1 tablet (500 mg total) by mouth 2 (two) times daily. 60 tablet 1  . METFORMIN HCL ER 750 MG PO TB24 Oral Take 750 mg by mouth 2 (two) times daily.      Marland Kitchen PIOGLITAZONE HCL 30 MG PO TABS Oral Take 30 mg by mouth daily.      Marland Kitchen PREGABALIN 50 MG PO CAPS Oral Take 50 mg by mouth at bedtime.      Marland Kitchen ROPINIROLE HCL 2 MG PO TABS Oral Take 2 mg by mouth at bedtime.      . TELMISARTAN 40 MG PO TABS Oral Take 40 mg by mouth daily.       There were no vitals taken for this visit.  Physical Exam  Nursing note and vitals reviewed. Constitutional: She is oriented to person, place, and time. She appears well-developed and well-nourished. No distress.  HENT:  Head: Normocephalic and  atraumatic.  Mouth/Throat: Oropharynx is clear and moist.       No bite marks on the tongue  Eyes: Conjunctivae and EOM are normal. Pupils are equal, round, and reactive to light.  Neck: Normal range of motion. Neck supple.  Cardiovascular: Normal rate, regular rhythm and intact distal pulses.   No murmur heard. Pulmonary/Chest: Effort normal and breath sounds normal. No respiratory distress. She has no wheezes. She has no rales.  Abdominal: Soft. She exhibits no distension. There is no tenderness. There is no rebound and no guarding.  Musculoskeletal: Normal range of motion. She exhibits no edema and no tenderness.  Neurological: She is alert and oriented to person, place, and time. No cranial nerve deficit. Coordination normal.  Skin: Skin is warm and dry. No rash noted. No erythema.  Psychiatric: She has a normal mood and affect. Her behavior is normal.    ED Course  Procedures (including critical care time)  Labs Reviewed  CBC - Abnormal; Notable for the following:    WBC 3.1 (*)    RBC 3.44 (*)    Hemoglobin 10.1 (*)    HCT 31.5 (*)    All other components within normal limits  DIFFERENTIAL - Abnormal; Notable for the following:    Neutro Abs 1.5 (*)    All other components within normal limits  BASIC METABOLIC PANEL - Abnormal; Notable for the following:    Glucose, Bld 132 (*)    BUN 27 (*)    Creatinine, Ser 1.12 (*)    GFR calc non Af Amer 44 (*)    GFR calc Af Amer 52 (*)    All other components within normal limits  GLUCOSE, CAPILLARY - Abnormal; Notable for the following:    Glucose-Capillary 127 (*)    All other components within normal limits  URINALYSIS, ROUTINE W REFLEX MICROSCOPIC  POCT CBG MONITORING   No results found.   1. Seizure       MDM   Patient with a seizure history who had a grand mal seizure today that lasted 5 minutes with generalized convulsions. By the time EMS arrived the patient was post ictal and on arrival here she was almost back  to baseline with just some mild confusion. She states she's been very stressed out recently and thinks that that's what triggered it. She's been on Keppra and is currently taking 500 mg twice a day. She has not missed any doses and has not had  any new changes in medication. Unable to check Keppra level however will check CBC, BMP, UA and that weight her family to arrive.  Family states this is a typical seizure for her and they just brought her to be checked out. She is back to baseline now per family and she has not missed any Keppra doses.  3:25 PM Labs within normal limits we'll discharge patient home and have her continue her current Keppra dose but to call her doctor on Monday for possible change in dosing.        Gwyneth Sprout, MD 05/09/11 1654

## 2011-05-09 NOTE — ED Notes (Signed)
In and out cath performed by Jackie Plum, RN. Sterile technique used. Pt stated no allergies to latex or iodine. All materials present post-procedure.

## 2011-05-09 NOTE — ED Notes (Signed)
Patient was at her family's nursing home when she had a tonic clonic seizure lasting approx 5 minutes. Upon ems arrival she was slightly post ictal. Upon arrival to the er pt was alert and oriented. No complaints. Iv started pta by ems. Breath sounds are clear and bowel sounds are present. Family at the bedside. Resting comfortably. No complaints at present.

## 2011-05-09 NOTE — ED Notes (Signed)
Pt was visiting with family member at [facility] when she had onset of tonic clonic seizure that lasted approximately 5 minutes. Upon ems arrival pt's seizure had resolved but remained slightly post ictal on arrival. Iv started pta by ems. Pt has hx of the same.

## 2011-05-28 ENCOUNTER — Encounter (HOSPITAL_COMMUNITY): Payer: Self-pay | Admitting: Emergency Medicine

## 2011-05-28 ENCOUNTER — Other Ambulatory Visit: Payer: Self-pay

## 2011-05-28 ENCOUNTER — Emergency Department (HOSPITAL_COMMUNITY)
Admission: EM | Admit: 2011-05-28 | Discharge: 2011-05-28 | Disposition: A | Discharge status: Home / Self Care | Payer: Medicare Other | Attending: Emergency Medicine | Admitting: Emergency Medicine

## 2011-05-28 DIAGNOSIS — I10 Essential (primary) hypertension: Secondary | ICD-10-CM | POA: Insufficient documentation

## 2011-05-28 DIAGNOSIS — R55 Syncope and collapse: Secondary | ICD-10-CM | POA: Insufficient documentation

## 2011-05-28 DIAGNOSIS — G2581 Restless legs syndrome: Secondary | ICD-10-CM | POA: Insufficient documentation

## 2011-05-28 DIAGNOSIS — M129 Arthropathy, unspecified: Secondary | ICD-10-CM | POA: Insufficient documentation

## 2011-05-28 DIAGNOSIS — R569 Unspecified convulsions: Secondary | ICD-10-CM | POA: Insufficient documentation

## 2011-05-28 DIAGNOSIS — Z8673 Personal history of transient ischemic attack (TIA), and cerebral infarction without residual deficits: Secondary | ICD-10-CM | POA: Insufficient documentation

## 2011-05-28 DIAGNOSIS — E119 Type 2 diabetes mellitus without complications: Secondary | ICD-10-CM | POA: Insufficient documentation

## 2011-05-28 DIAGNOSIS — Z87891 Personal history of nicotine dependence: Secondary | ICD-10-CM | POA: Insufficient documentation

## 2011-05-28 DIAGNOSIS — K219 Gastro-esophageal reflux disease without esophagitis: Secondary | ICD-10-CM | POA: Insufficient documentation

## 2011-05-28 LAB — BASIC METABOLIC PANEL
BUN: 28 mg/dL — ABNORMAL HIGH (ref 6–23)
CO2: 24 mEq/L (ref 19–32)
Calcium: 9.8 mg/dL (ref 8.4–10.5)
Chloride: 108 mEq/L (ref 96–112)
Creatinine, Ser: 0.94 mg/dL (ref 0.50–1.10)
GFR calc Af Amer: 64 mL/min — ABNORMAL LOW (ref >90)
GFR calc non Af Amer: 55 mL/min — ABNORMAL LOW (ref >90)
Glucose, Bld: 161 mg/dL — ABNORMAL HIGH (ref 70–99)
Potassium: 4 mEq/L (ref 3.5–5.1)
Sodium: 135 mEq/L (ref 135–145)

## 2011-05-28 LAB — CBC
HCT: 32.1 % — ABNORMAL LOW (ref 36.0–46.0)
Hemoglobin: 10.5 g/dL — ABNORMAL LOW (ref 12.0–15.0)
MCH: 29.8 pg (ref 26.0–34.0)
MCHC: 32.7 g/dL (ref 30.0–36.0)
MCV: 91.2 fL (ref 78.0–100.0)
Platelets: 175 10*3/uL (ref 150–400)
RBC: 3.52 MIL/uL — ABNORMAL LOW (ref 3.87–5.11)
RDW: 14.6 % (ref 11.5–15.5)
WBC: 3 10*3/uL — ABNORMAL LOW (ref 4.0–10.5)

## 2011-05-28 LAB — TROPONIN I: Troponin I: <0.3 ng/mL (ref <0.30)

## 2011-05-28 NOTE — ED Notes (Signed)
EMS reports patient had syncopal episode at church while sitting, loc for approximately 5 mins or less as reported by witnesses. Did not fall during episode.

## 2011-05-28 NOTE — ED Provider Notes (Signed)
History    78 year old female with a syncopal event. This happened while sitting at church. Per bystanders patient was unresponsive for a period of couple minutes. No tongue biting. No bladder or bowel incontinence. Patient with several similar episodes recently. Currently being worked up and thought to possibly be due secondary to seizures. Patient currently with no complaints. No fevers or chills. Denies any headaches or neck pain. No acute weakness, tingling or numbness. No shortness of breath or dizziness.   CSN: [id number]   Arrival date & time 05/28/11  1202   First MD Initiated Contact with Patient 05/28/11 1313      Chief Complaint  Patient presents with  . Loss of Consciousness    (Consider location/radiation/quality/duration/timing/severity/associated sxs/prior treatment) HPI  Past Medical History  Diagnosis Date  . Stroke   . GERD (gastroesophageal reflux disease)   . Hypertension   . Diabetes mellitus   . Blood transfusion   . Arthritis   . Anxiety   . Anemia   . GI (gastrointestinal bleed)     Due to benign mass  . Bronchitis   . Restless leg syndrome     Past Surgical History  Procedure Date  . Eye surgery     Cataract removal  . Abdominal hysterectomy   . Tubal ligation     Family History  Problem Relation Age of Onset  . Aneurysm Mother   . Heart attack Father   . Heart failure Sister   . Asthma Sister   . Kidney failure Sister   . Stroke Sister     History  Substance Use Topics  . Smoking status: Former Smoker    Types: Cigarettes  . Smokeless tobacco: Never Used  . Alcohol Use: No    OB History    Grav Para Term Preterm Abortions TAB SAB Ect Mult Living                  Review of Systems   Review of symptoms negative unless otherwise noted in HPI.   Allergies  Erythromycin; Penicillins; and Zithromax  Home Medications   Current Outpatient Rx  Name Route Sig Dispense Refill  . ACETAMINOPHEN ER 650 MG PO TBCR Oral Take  650 mg by mouth every 8 (eight) hours as needed. For pain     . AMLODIPINE-ATORVASTATIN 10-10 MG PO TABS Oral Take 1 tablet by mouth daily.    . ASPIRIN 325 MG PO TABS Oral Take 1 tablet (325 mg total) by mouth daily.    Marland Kitchen CALCIUM CARBONATE-VITAMIN D 500-400 MG-UNIT PO TABS Oral Take 1 tablet by mouth 2 (two) times daily.    Marland Kitchen CARVEDILOL PHOSPHATE ER 20 MG PO CP24 Oral Take 20 mg by mouth daily.      Marland Kitchen DOCUSATE SODIUM 100 MG PO CAPS Oral Take 100 mg by mouth 2 (two) times daily.      Marland Kitchen ESOMEPRAZOLE MAGNESIUM 40 MG PO CPDR Oral Take 40 mg by mouth daily before breakfast.      . FERROUS SULFATE 325 (65 FE) MG PO TABS Oral Take 325 mg by mouth daily with breakfast.      . LEVETIRACETAM 500 MG PO TABS Oral Take 1 tablet (500 mg total) by mouth 2 (two) times daily. 60 tablet 1  . METFORMIN HCL ER 750 MG PO TB24 Oral Take 750 mg by mouth 2 (two) times daily.     Marland Kitchen PREGABALIN 50 MG PO CAPS Oral Take 50 mg by mouth at bedtime.      Marland Kitchen  ROPINIROLE HCL 2 MG PO TABS Oral Take 4 mg by mouth 2 (two) times daily.       BP 115/50  Pulse 68  Temp(Src) 97.5 F (36.4 C) (Oral)  SpO2 97%  Physical Exam  Nursing note and vitals reviewed. Constitutional: She appears well-developed and well-nourished. No distress.       Sitting up in bed. No acute distress.  HENT:  Head: Normocephalic and atraumatic.  Right Ear: External ear normal.  Mouth/Throat: Oropharynx is clear and moist.  Eyes: Conjunctivae are normal. Pupils are equal, round, and reactive to light. Right eye exhibits no discharge. Left eye exhibits no discharge.  Neck: Normal range of motion. Neck supple.  Cardiovascular: Normal rate, regular rhythm and normal heart sounds.  Exam reveals no gallop and no friction rub.   No murmur heard. Pulmonary/Chest: Effort normal and breath sounds normal. No respiratory distress. She exhibits no tenderness.  Abdominal: Soft. She exhibits no distension. There is no tenderness.  Musculoskeletal: She exhibits no  edema and no tenderness.  Lymphadenopathy:    She has no cervical adenopathy.  Neurological: She is alert.       Good finger to nose and heel to shin testing bilaterally  Skin: Skin is warm and dry. She is not diaphoretic.  Psychiatric: She has a normal mood and affect. Her behavior is normal. Thought content normal.    ED Course  Procedures (including critical care time)  Labs Reviewed  BASIC METABOLIC PANEL - Abnormal; Notable for the following:    Glucose, Bld 161 (*)    BUN 28 (*)    GFR calc non Af Amer 55 (*)    GFR calc Af Amer 64 (*)    All other components within normal limits  CBC - Abnormal; Notable for the following:    WBC 3.0 (*)    RBC 3.52 (*)    Hemoglobin 10.5 (*)    HCT 32.1 (*)    All other components within normal limits  TROPONIN I  LAB REPORT - SCANNED  CBC   No results found.  EKG:  Rhythm: Normal sinus rhythm Rate: 82 Axis: Normal Intervals: Normal ST segments: Nonspecific ST changes   1. Seizure       MDM  78 year old female with a syncopal event at the church. Patient with a history of similar events. Recent admissions and workup for these. Presumed to be secondary to seizures. Patient is on Keppra for this. Patient had seen previously scheduled appointment with Dr. Denton Meek, neurology, tomorrow. Feel patient is appropriate for discharge at this time for followup. Today's w/u unremarkable aside from some lab abnormalities which are not significantly changed from previous. Nonfocal neurological examination.         Raeford Razor, MD 06/05/11 [phone]

## 2011-05-28 NOTE — ED Notes (Signed)
Patient states had a syncopal episode while at church sitting down, witness report patient loc for aprox. . Patient states she has had "passing out spells" for years, in the past 2 months she was told by her doctor the "passing out spells" were seizures, and she is being treated with medication, since meds started almost 2 months ago, she has had 3 passing out spells.

## 2011-05-29 ENCOUNTER — Ambulatory Visit: Payer: Self-pay | Admitting: Neurology

## 2011-06-08 ENCOUNTER — Encounter: Payer: Self-pay | Admitting: Neurology

## 2011-06-08 ENCOUNTER — Ambulatory Visit (INDEPENDENT_AMBULATORY_CARE_PROVIDER_SITE_OTHER): Payer: Medicare Other | Admitting: Neurology

## 2011-06-08 VITALS — BP 130/80 | HR 88 | Wt 196.0 lb

## 2011-06-08 DIAGNOSIS — R4182 Altered mental status, unspecified: Secondary | ICD-10-CM

## 2011-06-08 NOTE — Progress Notes (Signed)
-   few years - blood pressure , blue glucose  -5-10 minutes,  - sometimes a little confused afterwards  - lost control of bowels during a spell - slumps over  - 4 spells - 1 month of seizure medication  - getting more frequent  - tired otherwise  - haven't been sleeping well, 3- 4 times  - no snoring  - no cataplexy  - ropinirole, lyrica  - stroke, ischemic left sided - no head trauma,  - never tried to wake her  - always occur sitting down

## 2011-06-08 NOTE — Progress Notes (Signed)
Dear Dr. Concepcion Elk,  Thank you for having me see Elizabeth Pugh in consultation today at [facility] for her problem with spells.  As you may recall, she is a 78 y.o. year old female with a history of diabetes, restless leg syndrome, hypertension, lacunar strokes presents with a two-year history of spells of decreased level of consciousness. These are described as the patient "falling asleep". She typically feels incredibly tired before these occur. They always occur when she is sitting down. She slumps over and her face appears droopy. Her daughter who accompanies her says that both sides of her face are affected.  The spells typically lasts 10-15 minutes. She can be slightly confused afterwards but many times she returns quickly to normal. They typically occur earlier on in the day. They're not accompanied by any abnormal movements. There is bowel incontinence with some of these spells but she is never had bladder incontinence. They have been much more frequent 5 having occurred over the last 2 months.  The patient complains of at least a year history of difficulty sleeping. She has multiple bouts of nocturia at night. She frequently gets up 3-4 times per night and many times cannot go back to sleep. She's had a sleep study in the past which apparently showed obstructive sleep apnea. She has a long history of restless leg syndrome and has been taking ropinirole for her restless legs for at least 14 years. He is also on Lyrica for restless legs.  She's had multiple admissions to hospital. She's had 2 routine EEGs which have been unremarkable by report. MRI of brain revealed old right pontine lacunar infarctions as well as left basal ganglion infarctions.  There is diffuse atherosclerotic disease on MRA of the brain. Carotid Doppler done on December 10 revealed no obvious extracranial carotid artery stenosis.  The patient after her last admission about a month ago was placed on Keppra. She's had at  least 2 spells altered mental status since then.  Medical History:  Diabetes and hypertension. Blood pressure and blood glucose have been checked during the spells have been unremarkable. Restless leg syndrome. This bothers her both during the day and at night. Ischemic stroke involving her right pons which has left her with left sided weakness. It sounds like she is also had an asymptomatic infarct of her left basal ganglia. She was given a CPAP machine for obstructive sleep apnea also did not find it helped. She has had a sleep study several years ago but they noted and the nocturia as well.  Surgical History: Cataract extraction, and abdominal hysterectomy, tubal ligation.  Social History: Does not currently smoke tobacco or use alcohol.  Family History: No significant neurologic history.  Medications: Include aspirin 325 mg daily, levetiracetam 500 mg twice a day, pregabalin 50 mg each bedtime, ropinirole 2 mg tablets 2 twice a day. Metformin, Junious Dresser, carvedilol, amlodipine/atorvastatin.  Allergies  Allergen Reactions  . Erythromycin Other (See Comments)    Reaction=vgina itches  . Penicillins Rash  . Zithromax (Azithromycin) Other (See Comments)    Pt states that it gives her a yeast infection.      Review of systems:  13 systems were reviewed and are notable for [8].  All other review of systems are unremarkable.   Examination:  Filed Vitals:   06/08/11 1213  BP: 130/80  Pulse: 88  Weight: 196 lb (88.905 kg)     In general, well appearing older women wint walker.  Cardiovascular: The patient has a regular  rate and rhythm and no carotid bruits.  Fundoscopy:  Difficult to visualize due to pupillary constriction.  Mental status:   The patient is oriented to person, place and time. Recent and remote memory are intact. Attention span and concentration are normal. Language including repetition, naming, following commands are intact. Fund of knowledge of current and  historical events, as well as vocabulary are normal.  Cranial Nerves: Pupils are equally round and reactive to light. Visual fields full to confrontation. Extraocular movements are intact without nystagmus but with saccadic intrustion. Facial sensation and muscles of mastication are intact. Muscles of facial expression reveal left facial droop.Marland Kitchen Hearing intact to bilateral finger rub. Tongue protrusion, uvula, palate midline.  Shoulder shrug intact  Motor:  The patient has normal bulk and tone, no pronator drift.  There are no adventitious movements.  5/5 muscle strength bilaterally except for 4+ left hip flexor and left foot dorsiflexor.  Reflexes:   Biceps  Triceps Brachioradialis Knee Ankle  Right 2+  2+  2+   1+ 0  Left  3+  3+  3+   1+ 0  Toes down  Coordination:  Normal finger to nose.    Sensation is symmetric to light touch.  Gait and station are wide based, antalgic, unsteady.  MRI brain was reviewed and revealed global atrophy as well as the right pontine infarction and left basal ganglia infarction.  Routine EEGs were reviewed and did not reveal any interictal epileptiform discharges.  Impression and Recommendations: I do not think that Elizabeth Pugh is suffering from seizures. These events seem very much like sleep attacks. Certainly her nocturia, excessive daytime fatigue, dopamine agonist use put her at risk of these events. I don't think this represents narcolepsy as is diagnosis is impossible to make in the setting of poor sleep. I would advocate her stopping the levetiracetam for now. She's going to speak to her primary care physician to see if he has any suggestions for the nocturia. I think if she didn't have to get up and go to the bathroom she me her sleep would be much better better. I've suggested that she try to wean off the daytime ropinirole. She's going to take this under consideration. If these measures aren't successful then a trial of modafinil may be  useful.  We will see the patient back in 2 months.  Thank you for having [place] see Elizabeth Pugh in consultation.  Feel free to contact me with any questions.  Lupita Raider Modesto Charon, MD [facility], [place] [address] [place], [place] [address] Phone: [phone] Fax: [phone].

## 2011-06-16 ENCOUNTER — Other Ambulatory Visit (HOSPITAL_COMMUNITY): Payer: Self-pay | Admitting: Family Medicine

## 2011-08-06 ENCOUNTER — Ambulatory Visit: Payer: Self-pay | Admitting: Neurology

## 2011-09-22 ENCOUNTER — Inpatient Hospital Stay (HOSPITAL_COMMUNITY)
Admission: EM | Admit: 2011-09-22 | Discharge: 2011-09-28 | DRG: 377 | Disposition: A | Discharge status: Home / Self Care | Payer: Medicare Other | Attending: Cardiology | Admitting: Cardiology

## 2011-09-22 ENCOUNTER — Ambulatory Visit: Payer: Self-pay | Admitting: Neurology

## 2011-09-22 ENCOUNTER — Encounter (HOSPITAL_COMMUNITY): Payer: Self-pay | Admitting: *Deleted

## 2011-09-22 ENCOUNTER — Emergency Department (HOSPITAL_COMMUNITY): Payer: Medicare Other

## 2011-09-22 DIAGNOSIS — K648 Other hemorrhoids: Secondary | ICD-10-CM | POA: Diagnosis present

## 2011-09-22 DIAGNOSIS — K31811 Angiodysplasia of stomach and duodenum with bleeding: Principal | ICD-10-CM | POA: Diagnosis present

## 2011-09-22 DIAGNOSIS — D500 Iron deficiency anemia secondary to blood loss (chronic): Secondary | ICD-10-CM | POA: Diagnosis present

## 2011-09-22 DIAGNOSIS — K5730 Diverticulosis of large intestine without perforation or abscess without bleeding: Secondary | ICD-10-CM | POA: Diagnosis present

## 2011-09-22 DIAGNOSIS — D649 Anemia, unspecified: Secondary | ICD-10-CM

## 2011-09-22 DIAGNOSIS — I5033 Acute on chronic diastolic (congestive) heart failure: Secondary | ICD-10-CM | POA: Diagnosis present

## 2011-09-22 DIAGNOSIS — Z794 Long term (current) use of insulin: Secondary | ICD-10-CM

## 2011-09-22 DIAGNOSIS — I509 Heart failure, unspecified: Secondary | ICD-10-CM | POA: Diagnosis present

## 2011-09-22 DIAGNOSIS — K644 Residual hemorrhoidal skin tags: Secondary | ICD-10-CM | POA: Diagnosis present

## 2011-09-22 DIAGNOSIS — E119 Type 2 diabetes mellitus without complications: Secondary | ICD-10-CM | POA: Diagnosis present

## 2011-09-22 DIAGNOSIS — K922 Gastrointestinal hemorrhage, unspecified: Secondary | ICD-10-CM

## 2011-09-22 DIAGNOSIS — D175 Benign lipomatous neoplasm of intra-abdominal organs: Secondary | ICD-10-CM | POA: Diagnosis present

## 2011-09-22 DIAGNOSIS — K219 Gastro-esophageal reflux disease without esophagitis: Secondary | ICD-10-CM | POA: Diagnosis present

## 2011-09-22 DIAGNOSIS — I69959 Hemiplegia and hemiparesis following unspecified cerebrovascular disease affecting unspecified side: Secondary | ICD-10-CM

## 2011-09-22 DIAGNOSIS — D62 Acute posthemorrhagic anemia: Secondary | ICD-10-CM | POA: Diagnosis present

## 2011-09-22 DIAGNOSIS — G2581 Restless legs syndrome: Secondary | ICD-10-CM | POA: Diagnosis present

## 2011-09-22 DIAGNOSIS — I129 Hypertensive chronic kidney disease with stage 1 through stage 4 chronic kidney disease, or unspecified chronic kidney disease: Secondary | ICD-10-CM | POA: Diagnosis present

## 2011-09-22 DIAGNOSIS — E7800 Pure hypercholesterolemia, unspecified: Secondary | ICD-10-CM | POA: Diagnosis present

## 2011-09-22 DIAGNOSIS — N182 Chronic kidney disease, stage 2 (mild): Secondary | ICD-10-CM | POA: Diagnosis present

## 2011-09-22 DIAGNOSIS — M1990 Unspecified osteoarthritis, unspecified site: Secondary | ICD-10-CM | POA: Diagnosis present

## 2011-09-22 DIAGNOSIS — I4891 Unspecified atrial fibrillation: Secondary | ICD-10-CM | POA: Diagnosis present

## 2011-09-22 DIAGNOSIS — Z7982 Long term (current) use of aspirin: Secondary | ICD-10-CM

## 2011-09-22 LAB — DIFFERENTIAL
Basophils Relative: 0 % (ref 0–1)
Eosinophils Absolute: 0 10*3/uL (ref 0.0–0.7)
Lymphs Abs: 0.7 10*3/uL (ref 0.7–4.0)
Neutro Abs: 1.5 10*3/uL — ABNORMAL LOW (ref 1.7–7.7)
Neutrophils Relative %: 61 % (ref 43–77)

## 2011-09-22 LAB — HEMOGLOBIN AND HEMATOCRIT, BLOOD
HCT: 22.8 % — ABNORMAL LOW (ref 36.0–46.0)
Hemoglobin: 6.8 g/dL — CL (ref 12.0–15.0)

## 2011-09-22 LAB — CBC
MCH: 24.8 pg — ABNORMAL LOW (ref 26.0–34.0)
Platelets: 223 10*3/uL (ref 150–400)
RBC: 2.98 MIL/uL — ABNORMAL LOW (ref 3.87–5.11)
WBC: 2.5 10*3/uL — ABNORMAL LOW (ref 4.0–10.5)

## 2011-09-22 LAB — GLUCOSE, CAPILLARY: Glucose-Capillary: 97 mg/dL (ref 70–99)

## 2011-09-22 LAB — CARDIAC PANEL(CRET KIN+CKTOT+MB+TROPI)
Relative Index: INVALID (ref 0.0–2.5)
Total CK: 51 U/L (ref 7–177)

## 2011-09-22 LAB — POCT I-STAT, CHEM 8
Calcium, Ion: 1.22 mmol/L (ref 1.12–1.32)
Chloride: 108 mEq/L (ref 96–112)
HCT: 26 % — ABNORMAL LOW (ref 36.0–46.0)
TCO2: 23 mmol/L (ref 0–100)

## 2011-09-22 LAB — TROPONIN I: Troponin I: <0.3 ng/mL (ref <0.30)

## 2011-09-22 LAB — PRO B NATRIURETIC PEPTIDE: Pro B Natriuretic peptide (BNP): 2019 pg/mL — ABNORMAL HIGH (ref 0–450)

## 2011-09-22 MED ORDER — CARVEDILOL PHOSPHATE ER 20 MG PO CP24
20.0000 mg | ORAL_CAPSULE | Freq: Every day | ORAL | Status: DC
  Administered 2011-09-23 – 2011-09-28 (×6): 20 mg via ORAL
  Filled 2011-09-22 (×6): qty 1

## 2011-09-22 MED ORDER — SODIUM CHLORIDE 0.9 % IV SOLN: 250.0000 mL | INTRAVENOUS | Status: DC | PRN

## 2011-09-22 MED ORDER — FUROSEMIDE 10 MG/ML IJ SOLN
40.0000 mg | Freq: Once | INTRAMUSCULAR | Status: AC
  Administered 2011-09-22: 40 mg via INTRAVENOUS
  Filled 2011-09-22: qty 4

## 2011-09-22 MED ORDER — INSULIN ASPART 100 UNIT/ML ~~LOC~~ SOLN
0.0000 [IU] | SUBCUTANEOUS | Status: DC
  Administered 2011-09-23 (×3): 1 [IU] via SUBCUTANEOUS
  Administered 2011-09-23 – 2011-09-24 (×3): 2 [IU] via SUBCUTANEOUS
  Administered 2011-09-24 (×3): 1 [IU] via SUBCUTANEOUS
  Administered 2011-09-25: 2 [IU] via SUBCUTANEOUS
  Administered 2011-09-25: 1 [IU] via SUBCUTANEOUS

## 2011-09-22 MED ORDER — SODIUM CHLORIDE 0.9 % IJ SOLN: 3.0000 mL | INTRAMUSCULAR | Status: DC | PRN

## 2011-09-22 MED ORDER — SODIUM CHLORIDE 0.9 % IJ SOLN
3.0000 mL | Freq: Two times a day (BID) | INTRAMUSCULAR | Status: DC
  Administered 2011-09-23 – 2011-09-25 (×5): 3 mL via INTRAVENOUS
  Filled 2011-09-22: qty 3

## 2011-09-22 MED ORDER — ALBUTEROL SULFATE (5 MG/ML) 0.5% IN NEBU
5.0000 mg | INHALATION_SOLUTION | Freq: Once | RESPIRATORY_TRACT | Status: AC
  Administered 2011-09-22: 5 mg via RESPIRATORY_TRACT
  Filled 2011-09-22: qty 1

## 2011-09-22 MED ORDER — SODIUM CHLORIDE 0.9 % IV SOLN
8.0000 mg/h | INTRAVENOUS | Status: DC
  Administered 2011-09-22 – 2011-09-28 (×11): 8 mg/h via INTRAVENOUS
  Filled 2011-09-22 (×32): qty 80

## 2011-09-22 MED ORDER — MORPHINE SULFATE 2 MG/ML IJ SOLN
1.0000 mg | INTRAMUSCULAR | Status: DC | PRN
  Administered 2011-09-25 – 2011-09-28 (×7): 1 mg via INTRAVENOUS
  Filled 2011-09-22 (×7): qty 1

## 2011-09-22 MED ORDER — SODIUM CHLORIDE 0.9 % IJ SOLN
3.0000 mL | Freq: Two times a day (BID) | INTRAMUSCULAR | Status: DC
  Administered 2011-09-23 – 2011-09-28 (×6): 3 mL via INTRAVENOUS

## 2011-09-22 NOTE — ED Notes (Signed)
Resp called for breathing treatment.   

## 2011-09-22 NOTE — ED Notes (Signed)
Attempted to call report, rn unavialable at thsi time

## 2011-09-22 NOTE — ED Provider Notes (Signed)
History     CSN: [id number]  Arrival date & time 09/22/11  1511   First MD Initiated Contact with Patient 09/22/11 1555      Chief Complaint  Patient presents with  . Shortness of Breath  . Cough  . Weakness    (Consider location/radiation/quality/duration/timing/severity/associated sxs/prior treatment) Patient is a 78 y.o. female presenting with shortness of breath, cough, and weakness. The history is provided by the patient.  Shortness of Breath  Associated symptoms include cough and shortness of breath. Pertinent negatives include no chest pain.  Cough Associated symptoms include shortness of breath. Pertinent negatives include no chest pain and no headaches.  Weakness Primary symptoms do not include headaches, nausea or vomiting.  Additional symptoms include weakness. Additional symptoms do not include neck stiffness.   patient's had shortness of breath the last few days. Began on Thursdays been worsening until today. She's also had weakness and a nonproductive cough. No fevers. Shortness of breath worse with movement exertion. No chest pain. She does have increased swelling in both of her legs. No relief with her albuterol at home.  Past Medical History  Diagnosis Date  . Stroke   . GERD (gastroesophageal reflux disease)   . Hypertension   . Diabetes mellitus   . Blood transfusion   . Arthritis   . Anxiety   . Anemia   . GI (gastrointestinal bleed)     Due to benign mass  . Bronchitis   . Restless leg syndrome     Past Surgical History  Procedure Date  . Eye surgery     Cataract removal  . Abdominal hysterectomy   . Tubal ligation     Family History  Problem Relation Age of Onset  . Aneurysm Mother   . Heart attack Father   . Heart failure Sister   . Asthma Sister   . Kidney failure Sister   . Stroke Sister     History  Substance Use Topics  . Smoking status: Former Smoker    Types: Cigarettes    Quit date: 09/21/1985  . Smokeless tobacco: Never  Used  . Alcohol Use: No    OB History    Grav Para Term Preterm Abortions TAB SAB Ect Mult Living                  Review of Systems  Constitutional: Positive for fatigue. Negative for activity change and appetite change.  HENT: Negative for neck stiffness.   Eyes: Negative for pain.  Respiratory: Positive for cough and shortness of breath. Negative for chest tightness.   Cardiovascular: Negative for chest pain and leg swelling.  Gastrointestinal: Negative for nausea, vomiting, abdominal pain and diarrhea.  Genitourinary: Negative for flank pain.  Musculoskeletal: Negative for back pain.  Skin: Negative for rash.  Neurological: Positive for weakness. Negative for numbness and headaches.  Psychiatric/Behavioral: Negative for behavioral problems.    Allergies  Erythromycin; Penicillins; and Zithromax  Home Medications   Current Outpatient Rx  Name Route Sig Dispense Refill  . ACETAMINOPHEN ER 650 MG PO TBCR Oral Take 650 mg by mouth every 8 (eight) hours as needed. For pain     . AMLODIPINE-ATORVASTATIN 10-10 MG PO TABS Oral Take 1 tablet by mouth daily.    . ASPIRIN 325 MG PO TABS Oral Take 1 tablet (325 mg total) by mouth daily.    Marland Kitchen CALCIUM CARBONATE-VITAMIN D 500-400 MG-UNIT PO TABS Oral Take 1 tablet by mouth 2 (two) times daily.    Marland Kitchen  CARVEDILOL PHOSPHATE ER 20 MG PO CP24 Oral Take 20 mg by mouth daily.      Marland Kitchen DOCUSATE SODIUM 100 MG PO CAPS Oral Take 100 mg by mouth daily as needed.     Marland Kitchen ESOMEPRAZOLE MAGNESIUM 40 MG PO CPDR Oral Take 40 mg by mouth daily before breakfast.      . FERROUS SULFATE 325 (65 FE) MG PO TABS Oral Take 325 mg by mouth daily as needed.     Marland Kitchen METFORMIN HCL ER 750 MG PO TB24 Oral Take 750 mg by mouth 2 (two) times daily.     Marland Kitchen PIOGLITAZONE HCL 30 MG PO TABS Oral Take 30 mg by mouth daily.    Marland Kitchen PREGABALIN 50 MG PO CAPS Oral Take 50 mg by mouth at bedtime.      Marland Kitchen ROPINIROLE HCL 2 MG PO TABS Oral Take 2 mg by mouth 4 (four) times daily.     .  TELMISARTAN 40 MG PO TABS Oral Take 40 mg by mouth 2 (two) times daily.      BP 118/61  Pulse 97  Temp 98 F (36.7 C)  Resp 22  SpO2 97%  Physical Exam  Nursing note and vitals reviewed. Constitutional: She is oriented to person, place, and time. She appears well-developed and well-nourished.  HENT:  Head: Normocephalic and atraumatic.  Eyes: EOM are normal. Pupils are equal, round, and reactive to light.  Neck: Normal range of motion. Neck supple.  Cardiovascular: Normal rate, regular rhythm and normal heart sounds.   No murmur heard. Pulmonary/Chest: Effort normal. No respiratory distress. She has no wheezes. She has no rales.  Abdominal: Soft. Bowel sounds are normal. She exhibits no distension. There is no tenderness. There is no rebound and no guarding.  Musculoskeletal: Normal range of motion. She exhibits edema.       Bilateral lower extremity pitting edema.  Neurological: She is alert and oriented to person, place, and time. No cranial nerve deficit.  Skin: Skin is warm and dry.  Psychiatric: She has a normal mood and affect. Her speech is normal.    ED Course  Procedures (including critical care time)  Labs Reviewed  CBC - Abnormal; Notable for the following:    WBC 2.5 (*)    RBC 2.98 (*)    Hemoglobin 7.4 (*)    HCT 24.2 (*)    MCH 24.8 (*)    RDW 16.1 (*)    All other components within normal limits  DIFFERENTIAL - Abnormal; Notable for the following:    Neutro Abs 1.5 (*)    All other components within normal limits  PRO B NATRIURETIC PEPTIDE - Abnormal; Notable for the following:    Pro B Natriuretic peptide (BNP) 2019.0 (*)    All other components within normal limits  POCT I-STAT, CHEM 8 - Abnormal; Notable for the following:    BUN 31 (*)    Glucose, Bld 159 (*)    Hemoglobin 8.8 (*)    HCT 26.0 (*)    All other components within normal limits  TROPONIN I  OCCULT BLOOD, POC DEVICE  TYPE AND SCREEN   Dg Chest 2 View  09/22/2011  *RADIOLOGY  REPORT*  Clinical Data: Shortness of breath  CHEST - 2 VIEW  Comparison: 04/12/2011  Findings: Cardiomegaly with pulmonary vascular congestion and suspected mild interstitial edema.  Additional focal opacity in the right lower lung, possibly asymmetric edema, underlying pneumonia not excluded.  No pneumothorax.  Degenerative changes of the visualized thoracolumbar  spine.  IMPRESSION: Cardiomegaly with pulmonary vascular congestion and suspected mild interstitial edema.  Additional focal opacity in the right lower lung, possibly asymmetric edema, underlying pneumonia not excluded.  Follow-up radiographs are suggested to document resolution.  Original Report Authenticated By: Charline Bills, M.D.     1. CHF (congestive heart failure)   2. Anemia   3. GI bleed   4. Atrial fibrillation     Date:78  Rhythm: atrial fibrillation  QRS Axis: normal  Intervals: normal  ST/T Wave abnormalities: normal  Conduction Disutrbances:none  Narrative Interpretation: afib is new  Old EKG Reviewed: changes noted     MDM  Patient with shortness of breath. She has increasing dyspnea. She is swelling in her legs. She has a worsening anemia. She is guaiac positive. X-ray shows likely CHF. Pneumonia felt less likely. Patient be admitted to medicine. Dr. Elnoria Howard from GI has also been consulted.         Juliet Rude. Rubin Payor, MD 09/22/11 [phone]

## 2011-09-22 NOTE — H&P (Signed)
History and Physical  Elizabeth Pugh NWG:[id number] DOB: 10/26/1926 DOA: 09/22/2011  Referring physician: PCP: Dorrene German, MD, MD   Chief Complaint: SOB  HPI:  78 yr old female with multiple med issues started to have SOB and diffculty breathign since Thursday. asnd devleoped some wekaness and was suppsoed to see Dr. Concepcion Elk today but as she felt too weak came straight here-she thought she was having some bronchitis, and thougyht this was the cause and was taking albuterol Rx as well-had some congestion in her throat whichw as helped by the albuterol. As she felt weaker and weaker came over here. deneis any CP currently but did have some CP one night between last Thursday and now -no fast hr, did have some palpitations on moving around which was new for her.-usually [place] with a walker and can go from the porch to the car and usually just ambulates around the house-but was exhausted just from getting to the RR. No fever or sputum production-but had this when this first all started-the albuterol seemd to help.  NO sick contacts. Lives with daughter No dizzyness but is unsteady anyways-no lightheadedness, no weakness on any one side of the body, no CP going down arm or into her neck has had some dark stool which started maybe this morning-had about 2 episiodes of dark stool.  Usually takes ASA only takes tylenol if she has pain   In the emergency room patient was found to have a hemoglobin of 7.4 which on repeat was 8.8, INR of 1.07, BUN and creatinine 31/0.8, proBNP of 2019.2 troponins were negative Chest x-ray done showed pulmonary vascular congestion mild interstitial edema with possible additional focal opacity right lower lung with asymmetric edema and underlying pneumonia not excluded  Chart Review:  Recurrent GIB-EGD 12/1999=mild edema around pylorus, colonoscopy=small non bleeding int/ext hemorrhoidss +diverticula +sessile polyp-on admit 09/2010=Submucosal antral mass pathology was  benign at that time  Admit 5.17.12 for UGIB-pre-syncopal and found to have iron deficiency at that time and transfused  H/o CVA 1998  H/o DM  H/o Restless legs syndrome  H/o htn  Admit 03/25/11 with AMS-had work-up with MRI and no casue found  Nuclear stress done 12/08/10 was neg by Dr. Sharyn Lull  Admit 12.9.12 with Syncope with AMS-thogutht o have seizure like activity-strted back on ASA 325  (previously on plavix)  Last echo 04/13/11 = wall motion grade 1 diastolic dysfunction-he has not reported  Review of Systems:  No blurred or double vision, no abd pain, no n/v, apetite has been good and is hungry now, has had increasing LE swelling and tightness in stomach and clothes have Not felt tighter-has needed to use more pillows than usual at night.  When she lies flat she feels short winded.  Past Medical History  Diagnosis Date  . Stroke   . GERD (gastroesophageal reflux disease)   . Hypertension   . Diabetes mellitus   . Blood transfusion   . Arthritis   . Anxiety   . Anemia   . GI (gastrointestinal bleed)     Due to benign mass  . Bronchitis   . Restless leg syndrome     Past Surgical History  Procedure Date  . Eye surgery     Cataract removal  . Abdominal hysterectomy   . Tubal ligation     Social History:  reports that she has quit smoking. Her smoking use included Cigarettes. She has never used smokeless tobacco. She reports that she does not drink alcohol or use illicit  drugs.  Allergies  Allergen Reactions  . Erythromycin Other (See Comments)    Reaction=vgina itches  . Penicillins Rash  . Zithromax (Azithromycin) Other (See Comments)    Pt states that it gives her a yeast infection.    Family History  Problem Relation Age of Onset  . Aneurysm Mother   . Heart attack Father   . Heart failure Sister   . Asthma Sister   . Kidney failure Sister   . Stroke Sister      Prior to Admission medications   Medication Sig Start Date End Date Taking?  Authorizing Provider  acetaminophen (TYLENOL) 650 MG CR tablet Take 650 mg by mouth every 8 (eight) hours as needed. For pain    Yes Historical Provider, MD  amlodipine-atorvastatin (CADUET) 10-10 MG per tablet Take 1 tablet by mouth daily.   Yes Historical Provider, MD  aspirin 325 MG tablet Take 1 tablet (325 mg total) by mouth daily. 04/17/11 04/16/12 Yes Standley Brooking, MD  calcium-vitamin D (CALCIUM 500+D) 500-400 MG-UNIT per tablet Take 1 tablet by mouth 2 (two) times daily.   Yes Historical Provider, MD  carvedilol (COREG CR) 20 MG 24 hr capsule Take 20 mg by mouth daily.     Yes Historical Provider, MD  docusate sodium (COLACE) 100 MG capsule Take 100 mg by mouth daily as needed.    Yes Historical Provider, MD  esomeprazole (NEXIUM) 40 MG capsule Take 40 mg by mouth daily before breakfast.     Yes Historical Provider, MD  ferrous sulfate 325 (65 FE) MG tablet Take 325 mg by mouth daily as needed.    Yes Historical Provider, MD  metFORMIN (GLUCOPHAGE-XR) 750 MG 24 hr tablet Take 750 mg by mouth 2 (two) times daily.    Yes Historical Provider, MD  pioglitazone (ACTOS) 30 MG tablet Take 30 mg by mouth daily.   Yes Historical Provider, MD  pregabalin (LYRICA) 50 MG capsule Take 50 mg by mouth at bedtime.     Yes Historical Provider, MD  rOPINIRole (REQUIP) 2 MG tablet Take 2 mg by mouth 4 (four) times daily.    Yes Historical Provider, MD  telmisartan (MICARDIS) 40 MG tablet Take 40 mg by mouth 2 (two) times daily.   Yes Historical Provider, MD   Physical Exam: Filed Vitals:   09/22/11 1522 09/22/11 1526  BP: 118/61   Pulse: 97   Temp: 98 F (36.7 C)   Resp: 22   SpO2: 98% 97%     General:  Alert pleasant oriented aaf in NAD  Eyes: mild pallor, no ict, Arcus +   ENT: dentures, throat clear  Neck: JVD elevated to 7-8 cm, no bruit  Cardiovascular:  s1s2 irreg irreg rythmn confirmed as A fib on monitor  Respiratory: decreased bs's, no rales or TVR/tvf  Abdomen: soft, nd nt  no rebound or guarding  Skin: Grade 2 lower extremity pitting edema up to knee  Musculoskeletal: Was all 4 limbs equally  Psychiatric: Euthymic  Neurologic: CNS 2 through 12 grossly intact, power and left lower extremity is slightly weaker than right in hip flexors and knee flexors  Labs on Admission:  Basic Metabolic Panel:  Lab 09/22/11 1610  NA 141  K 4.3  CL 108  CO2 --  GLUCOSE 159*  BUN 31*  CREATININE 0.80  CALCIUM --  MG --  PHOS --    Liver Function Tests: No results found for this basename: AST:5,ALT:5,ALKPHOS:5,BILITOT:5,PROT:5,ALBUMIN:5 in the last 168 hours No results found  for this basename: LIPASE:5,AMYLASE:5 in the last 168 hours No results found for this basename: AMMONIA:5 in the last 168 hours  CBC:  Lab 09/22/11 1615 09/22/11 1605  WBC -- 2.5*  NEUTROABS -- 1.5*  HGB 8.8* 7.4*  HCT 26.0* 24.2*  MCV -- 81.2  PLT -- 223    Cardiac Enzymes:  Lab 09/22/11 1605  CKTOTAL --  CKMB --  CKMBINDEX --  TROPONINI <0.30    Troponin (Point of Care Test) No results found for this basename: TROPIPOC in the last 72 hours  BNP (last 3 results)  Basename 09/22/11 1605  PROBNP 2019.0*    CBG: No results found for this basename: GLUCAP:5 in the last 168 hours   Radiological Exams on Admission: Dg Chest 2 View  09/22/2011  *RADIOLOGY REPORT*  Clinical Data: Shortness of breath  CHEST - 2 VIEW  Comparison: 04/12/2011  Findings: Cardiomegaly with pulmonary vascular congestion and suspected mild interstitial edema.  Additional focal opacity in the right lower lung, possibly asymmetric edema, underlying pneumonia not excluded.  No pneumothorax.  Degenerative changes of the visualized thoracolumbar spine.  IMPRESSION: Cardiomegaly with pulmonary vascular congestion and suspected mild interstitial edema.  Additional focal opacity in the right lower lung, possibly asymmetric edema, underlying pneumonia not excluded.  Follow-up radiographs are suggested to  document resolution.  Original Report Authenticated By: Charline Bills, M.D.    EKG: Independently reviewed. Atrial fibrillation at a rate of about 80 axis about 70 borderline criteria for LVH, some ST elevation in V2 however no T-wave inversions. This is a new finding from prior EKG done 125/13.   Active Problems:  * No active hospital problems. *   Assessment/Plan 1. Shortness of breath-possibly multifactorial-she might have a pneumonia-although this is less likely-or concerning as she probably has a rate related cardiomyopathy causing decompensated CHF given she is in A. fib and has felt more short of breath and has developed acute volume overload from this-patient is on Actos which is known to exacerbate this and will be stopped immediately-I will judiciously give Lasix 40 mg IV now and this can be repeated in the morning if she still has volume overload. We will get a chest x-ray repeat in the morning-given the BNP is elevated at 2000 I feel is most likely etiology we will hold antibiotics at this time, or paroxysmal nocturnal dyspnea also is concerning for the same-Place Foley, strict in and outs, daily weights 2. New-onset atrial fibrillation-Chad Score 5.  patient already is on Coreg 20 mg every 24 hourly which will be continued. As she is rate controlled we will not add any medications at current time to this. Etiology could be anemia, causing elevated heart rate we'll get a TSH to rule out other causes and her anticoagulation will have to be addressed by cardiologist who has been consulted-Dr. Raymondo Band requests that we admit patient however he is happy to assume care of the patient as attending physician from tomorrow 5/22.  Consult much appreciated. 3. Possible GI bleed-hemoglobin initially was 7.4 however came up to 8.8 on repeat. I believe she has a slow GI bleed and we will keep on clear liquids for now. Dr. Loreta Ave has been consulted to weigh in on timing of scope. This would clearly  affect her choice of anticoagulation in the past she's had iron pill gastritis, and a benign submucosal antral lesion which was normal. For the time being I will hold transfusion and we will repeat another hemoglobin in 8 hours. If her hemoglobin  drops below 7.0 I feel it is prudent to transfuse her especially given her new onset A. fib which could be related to this-Will place on a Protonix drip for now until seen by GI 4. History CVA 1998-hold aspirin for now given her possibility of having a GI bleed 5. History diabetes mellitus-hold Actos indefinitely given his propensity to cause heart failure. Discontinued other oral hypoglycemics and keep on sliding scale every 4 hourly coverage given she'll be relatively n.p.o. to clear liquids until seen by gastroenterologist 6. History of hypertension-orthostatic vital signs. I will hold majority of her blood pressure medications other than her Coreg which will help with rate control of her new onset A. fib and will place hold parameters on this as well. 7. Chronic kidney disease stage II-possibly slightly exacerbated by her GI bleed, ? Upper.  Monitor labs 8. Restless leg syndrome-hold by mouth medications until seen by GI   Code Status: Full Family Communication: Discussed with 2 daughters at bedside Disposition Plan: Admit to step down given intensive monitoring needed, Dr. Sharyn Lull to assume care in the morning and he'll see tonight  Pleas Koch, MD [facility] ([phone]  If 8PM-8AM, please contact floor/night-coverage at [email or web address], password [facility] 09/22/2011, 6:19 PM

## 2011-09-22 NOTE — ED Notes (Signed)
Pt given diet coke per her request.  MD ok with pt having fluids.  Pt family member at bedside given a coffee.

## 2011-09-22 NOTE — ED Notes (Signed)
Pt reports she has noted shortness of breath starting Thursday and becoming worse until today. Pt also reports weakness and non-productive cough.  Pt denies fevers. Pt 02 saturation 97% on RA.  Pt reports shortness of breath worse with movement and exertion.

## 2011-09-22 NOTE — Consult Note (Signed)
Reason for Consult: Melena and weakness Referring Physician: [facility]  Basha P Matto HPI: This is an 78 year old female with a PMH of anemia who presents to the ER with progressive weakness.  She started to have symptoms of weakness starting last Thursday.  She was not able to see Dr. Concepcion Elk in the office today and subsequently she presented to the ER.  Her HGB dropped down to 7.4 and last year in the office her HGB was in the 11 range.  An EGD was performed by Dr. Loreta Ave on 09/2010 with findings of a large antral submucosal lesion.  It was thought to be a GIST, but for unknown reasons no further evaluations, i.e., with an EUS, were performed.  The patient denies any problems with abdominal pain, nausea, or vomiting.  No chest pain at this time.  She does have a history of pandiverticulosis and her hemoccult was positive.  She reports having two episodes of black stools, which were formed.  Past Medical History  Diagnosis Date  . Stroke   . GERD (gastroesophageal reflux disease)   . Hypertension   . Diabetes mellitus   . Blood transfusion   . Arthritis   . Anxiety   . Anemia   . GI (gastrointestinal bleed)     Due to benign mass  . Bronchitis   . Restless leg syndrome     Past Surgical History  Procedure Date  . Eye surgery     Cataract removal  . Abdominal hysterectomy   . Tubal ligation     Family History  Problem Relation Age of Onset  . Aneurysm Mother   . Heart attack Father   . Heart failure Sister   . Asthma Sister   . Kidney failure Sister   . Stroke Sister     Social History:  reports that she quit smoking about 26 years ago. Her smoking use included Cigarettes. She has never used smokeless tobacco. She reports that she does not drink alcohol or use illicit drugs.  Allergies:  Allergies  Allergen Reactions  . Erythromycin Other (See Comments)    Reaction=vgina itches  . Penicillins Rash  . Zithromax (Azithromycin) Other (See Comments)    Pt states  that it gives her a yeast infection.    Medications:  Scheduled:   . albuterol  5 mg Nebulization Once   Continuous:   Results for orders placed during the hospital encounter of 09/22/11 (from the past 24 hour(s))  CBC     Status: Abnormal   Collection Time   09/22/11  4:05 PM      Component Value Range   WBC 2.5 (*) 4.0 - 10.5 (K/uL)   RBC 2.98 (*) 3.87 - 5.11 (MIL/uL)   Hemoglobin 7.4 (*) 12.0 - 15.0 (g/dL)   HCT 91.4 (*) 78.2 - 46.0 (%)   MCV 81.2  78.0 - 100.0 (fL)   MCH 24.8 (*) 26.0 - 34.0 (pg)   MCHC 30.6  30.0 - 36.0 (g/dL)   RDW 95.6 (*) 21.3 - 15.5 (%)   Platelets 223  150 - 400 (K/uL)  DIFFERENTIAL     Status: Abnormal   Collection Time   09/22/11  4:05 PM      Component Value Range   Neutrophils Relative 61  43 - 77 (%)   Neutro Abs 1.5 (*) 1.7 - 7.7 (K/uL)   Lymphocytes Relative 26  12 - 46 (%)   Lymphs Abs 0.7  0.7 - 4.0 (K/uL)   Monocytes  Relative 12  3 - 12 (%)   Monocytes Absolute 0.3  0.1 - 1.0 (K/uL)   Eosinophils Relative 1  0 - 5 (%)   Eosinophils Absolute 0.0  0.0 - 0.7 (K/uL)   Basophils Relative 0  0 - 1 (%)   Basophils Absolute 0.0  0.0 - 0.1 (K/uL)  PRO B NATRIURETIC PEPTIDE     Status: Abnormal   Collection Time   09/22/11  4:05 PM      Component Value Range   Pro B Natriuretic peptide (BNP) 2019.0 (*) 0 - 450 (pg/mL)  TROPONIN I     Status: Normal   Collection Time   09/22/11  4:05 PM      Component Value Range   Troponin I <0.30  <0.30 (ng/mL)  POCT I-STAT, CHEM 8     Status: Abnormal   Collection Time   09/22/11  4:15 PM      Component Value Range   Sodium 141  135 - 145 (mEq/L)   Potassium 4.3  3.5 - 5.1 (mEq/L)   Chloride 108  96 - 112 (mEq/L)   BUN 31 (*) 6 - 23 (mg/dL)   Creatinine, Ser 1.61  0.50 - 1.10 (mg/dL)   Glucose, Bld 096 (*) 70 - 99 (mg/dL)   Calcium, Ion 0.45  4.09 - 1.32 (mmol/L)   TCO2 23  0 - 100 (mmol/L)   Hemoglobin 8.8 (*) 12.0 - 15.0 (g/dL)   HCT 81.1 (*) 91.4 - 46.0 (%)  OCCULT BLOOD, POC DEVICE     Status:  Normal   Collection Time   09/22/11  5:59 PM      Component Value Range   Fecal Occult Bld POSITIVE       Dg Chest 2 View  09/22/2011  *RADIOLOGY REPORT*  Clinical Data: Shortness of breath  CHEST - 2 VIEW  Comparison: 04/12/2011  Findings: Cardiomegaly with pulmonary vascular congestion and suspected mild interstitial edema.  Additional focal opacity in the right lower lung, possibly asymmetric edema, underlying pneumonia not excluded.  No pneumothorax.  Degenerative changes of the visualized thoracolumbar spine.  IMPRESSION: Cardiomegaly with pulmonary vascular congestion and suspected mild interstitial edema.  Additional focal opacity in the right lower lung, possibly asymmetric edema, underlying pneumonia not excluded.  Follow-up radiographs are suggested to document resolution.  Original Report Authenticated By: Charline Bills, M.D.    ROS:  As stated above in the HPI otherwise negative.  Blood pressure 118/61, pulse 97, temperature 98 F (36.7 C), resp. rate 22, SpO2 97.00%.    PE: Gen: NAD, Alert and Oriented HEENT:  [place]/AT, EOMI Neck: Supple, no LAD Lungs: CTA Bilaterally CV: RRR without M/G/R ABM: Soft, NTND, +BS Ext: No C/C/E  Assessment/Plan: 1) Anemia. 2) Heme positive stool. 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   The patient is stable at this time, i.e., no significant active bleeding.  She will benefit with blood transfusions.  Given her history of a possible GIST I will schedule her for an EGD/EUS on Thursday.  This is provided that she continues to remain hemodynamically stable.  Plan: 1) Agree with blood transfusions. 2) Monitor HGB. 3) Clear liquid diet for now. 4) EGD/EUS on Thursday.  Batoul Limes D 09/22/2011, 7:20 PM

## 2011-09-22 NOTE — ED Notes (Signed)
Expiratory wheezing noted to left lower and upper.

## 2011-09-22 NOTE — Consult Note (Signed)
Reason for Consult: New-onset A. fib/congestive heart failure  Referring Physician: [facility] hospital[facility]  Elizabeth Pugh is an 78 y.o. female.  HPI: Patient is 78 year old female with past medical history significant for multiple medical problems i.e. 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 Patient states she also noticed a dark tarry stool today felt very weak and so decided to come to ER. Patient presently denies any chest pain palpitation dizziness lightheadedness but states had vague chest pain lasting few minutes her in the left side and without any associated symptoms few days ago. In ER patient was noted to be in A. fib with the controlled ventricular response and was noted to be significantly anemic with hemoglobin of 7.4 . Patient already seen by GI and possibly will be scheduled for upper endoscopy and next 48 hours.  Past Medical History  Diagnosis Date  . Stroke   . GERD (gastroesophageal reflux disease)   . Hypertension   . Diabetes mellitus   . Blood transfusion   . Arthritis   . Anxiety   . Anemia   . GI (gastrointestinal bleed)     Due to benign mass  . Bronchitis   . Restless leg syndrome     Past Surgical History  Procedure Date  . Eye surgery     Cataract removal  . Abdominal hysterectomy   . Tubal ligation     Family History  Problem Relation Age of Onset  . Aneurysm Mother   . Heart attack Father   . Heart failure Sister   . Asthma Sister   . Kidney failure Sister   . Stroke Sister     Social History:  reports that she quit smoking  about 26 years ago. Her smoking use included Cigarettes. She has never used smokeless tobacco. She reports that she does not drink alcohol or use illicit drugs.  Allergies:  Allergies  Allergen Reactions  . Erythromycin Other (See Comments)    Reaction=vgina itches  . Penicillins Rash  . Zithromax (Azithromycin) Other (See Comments)    Pt states that it gives her a yeast infection.    Medications: I have reviewed the patient's current medications.  Results for orders placed during the hospital encounter of 09/22/11 (from the past 48 hour(s))  CBC     Status: Abnormal   Collection Time   09/22/11  4:05 PM      Component Value Range Comment   WBC 2.5 (*) 4.0 - 10.5 (K/uL)    RBC 2.98 (*) 3.87 - 5.11 (MIL/uL)    Hemoglobin 7.4 (*) 12.0 - 15.0 (g/dL)    HCT 40.9 (*) 81.1 - 46.0 (%)    MCV 81.2  78.0 - 100.0 (fL)    MCH 24.8 (*) 26.0 - 34.0 (pg)    MCHC 30.6  30.0 - 36.0 (g/dL)    RDW 91.4 (*) 78.2 - 15.5 (%)    Platelets 223  150 - 400 (K/uL)   DIFFERENTIAL     Status: Abnormal   Collection Time   09/22/11  4:05 PM      Component Value Range Comment   Neutrophils Relative 61  43 -  77 (%)    Neutro Abs 1.5 (*) 1.7 - 7.7 (K/uL)    Lymphocytes Relative 26  12 - 46 (%)    Lymphs Abs 0.7  0.7 - 4.0 (K/uL)    Monocytes Relative 12  3 - 12 (%)    Monocytes Absolute 0.3  0.1 - 1.0 (K/uL)    Eosinophils Relative 1  0 - 5 (%)    Eosinophils Absolute 0.0  0.0 - 0.7 (K/uL)    Basophils Relative 0  0 - 1 (%)    Basophils Absolute 0.0  0.0 - 0.1 (K/uL)   PRO B NATRIURETIC PEPTIDE     Status: Abnormal   Collection Time   09/22/11  4:05 PM      Component Value Range Comment   Pro B Natriuretic peptide (BNP) 2019.0 (*) 0 - 450 (pg/mL)   TROPONIN I     Status: Normal   Collection Time   09/22/11  4:05 PM      Component Value Range Comment   Troponin I <0.30  <0.30 (ng/mL)   POCT I-STAT, CHEM 8     Status: Abnormal   Collection Time   09/22/11  4:15 PM      Component Value Range Comment    Sodium 141  135 - 145 (mEq/L)    Potassium 4.3  3.5 - 5.1 (mEq/L)    Chloride 108  96 - 112 (mEq/L)    BUN 31 (*) 6 - 23 (mg/dL)    Creatinine, Ser 4.09  0.50 - 1.10 (mg/dL)    Glucose, Bld 811 (*) 70 - 99 (mg/dL)    Calcium, Ion 9.14  1.12 - 1.32 (mmol/L)    TCO2 23  0 - 100 (mmol/L)    Hemoglobin 8.8 (*) 12.0 - 15.0 (g/dL)    HCT 78.2 (*) 95.6 - 46.0 (%)   OCCULT BLOOD, POC DEVICE     Status: Normal   Collection Time   09/22/11  5:59 PM      Component Value Range Comment   Fecal Occult Bld POSITIVE     TYPE AND SCREEN     Status: Normal   Collection Time   09/22/11  6:05 PM      Component Value Range Comment   ABO/RH(D) A POS      Antibody Screen NEG      Sample Expiration 09/25/2011     GLUCOSE, CAPILLARY     Status: Normal   Collection Time   09/22/11  9:37 PM      Component Value Range Comment   Glucose-Capillary 97  70 - 99 (mg/dL)     Dg Chest 2 View  06/17/863  *RADIOLOGY REPORT*  Clinical Data: Shortness of breath  CHEST - 2 VIEW  Comparison: 04/12/2011  Findings: Cardiomegaly with pulmonary vascular congestion and suspected mild interstitial edema.  Additional focal opacity in the right lower lung, possibly asymmetric edema, underlying pneumonia not excluded.  No pneumothorax.  Degenerative changes of the visualized thoracolumbar spine.  IMPRESSION: Cardiomegaly with pulmonary vascular congestion and suspected mild interstitial edema.  Additional focal opacity in the right lower lung, possibly asymmetric edema, underlying pneumonia not excluded.  Follow-up radiographs are suggested to document resolution.  Original Report Authenticated By: Charline Bills, M.D.    Review of Systems  Constitutional: Negative for fever, chills and weight loss.  HENT: Negative for hearing loss.   Eyes: Negative for blurred vision and double vision.  Respiratory: Positive for cough and shortness of breath. Negative for hemoptysis and  sputum production.   Cardiovascular: Positive for  orthopnea, leg swelling and PND. Negative for chest pain and palpitations.  Gastrointestinal: Negative for nausea, vomiting and abdominal pain.  Neurological: Positive for dizziness. Negative for headaches.   Blood pressure 144/79, pulse 88, temperature 98.1 F (36.7 C), temperature source Oral, resp. rate 18, height 5\' 7"  (1.702 m), weight 100.2 kg (220 lb 14.4 oz), SpO2 94.00%. Physical Exam  Constitutional: She is oriented to person, place, and time.  HENT:  Head: Normocephalic and atraumatic.  Eyes:       Conjunctivae were pale sclera nonicteric  Neck: Neck supple. JVD present. No tracheal deviation present. No thyromegaly present.  Cardiovascular:       Irregularly irregular S1-S2 normal a soft systolic murmur and S3 gallop  Respiratory:       Decreased breath sound at bases with bibasilar faint rales  GI: Soft. Bowel sounds are normal. She exhibits no distension. There is no tenderness. There is no rebound and no guarding.  Musculoskeletal:       No clubbing cyanosis 2+ edema noted  Lymphadenopathy:    She has no cervical adenopathy.  Neurological: She is alert and oriented to person, place, and time.    Assessment/Plan: Mild decompensated diastolic heart failure which is exacerbated by A. fib/anemia New-onset A. fib CHA DS score 5 Acute on chronic anemia rule out GI loss Hypertension Insulin requiring diabetes mellitus History of CVA with left paresis in the 1998/history of TIA in 2012 Degenerative joint disease Restless leg syndrome History of questionable peptic ulcer disease/GI TS Hypercholesteremia Plan Continue present management Check 2-D echo Check serial enzymes and EKG Check TSH, and serial H&H Patient had very high risk for recurrent stroke in view of A. fib and CHA DS score of 5. Will consider anticoagulation once cleared by GI and hemoglobin is stable Check old records [facility] N 09/22/2011, 9:48 PM

## 2011-09-23 ENCOUNTER — Inpatient Hospital Stay (HOSPITAL_COMMUNITY): Payer: Medicare Other

## 2011-09-23 LAB — LIPID PANEL
HDL: 46 mg/dL (ref >39)
Total CHOL/HDL Ratio: 2.2 RATIO
Triglycerides: 70 mg/dL (ref <150)

## 2011-09-23 LAB — URINALYSIS, ROUTINE W REFLEX MICROSCOPIC
Nitrite: NEGATIVE
Protein, ur: 100 mg/dL — AB
Urobilinogen, UA: 2 mg/dL — ABNORMAL HIGH (ref 0.0–1.0)

## 2011-09-23 LAB — GLUCOSE, CAPILLARY
Glucose-Capillary: 118 mg/dL — ABNORMAL HIGH (ref 70–99)
Glucose-Capillary: 139 mg/dL — ABNORMAL HIGH (ref 70–99)
Glucose-Capillary: 132 mg/dL — ABNORMAL HIGH (ref 70–99)
Glucose-Capillary: 127 mg/dL — ABNORMAL HIGH (ref 70–99)

## 2011-09-23 LAB — CBC
HCT: 23.7 % — ABNORMAL LOW (ref 36.0–46.0)
Hemoglobin: 7.2 g/dL — ABNORMAL LOW (ref 12.0–15.0)
MCHC: 30.4 g/dL (ref 30.0–36.0)
WBC: 2.5 10*3/uL — ABNORMAL LOW (ref 4.0–10.5)

## 2011-09-23 LAB — BASIC METABOLIC PANEL
BUN: 21 mg/dL (ref 6–23)
Chloride: 103 mEq/L (ref 96–112)
GFR calc Af Amer: 87 mL/min — ABNORMAL LOW (ref >90)
GFR calc non Af Amer: 75 mL/min — ABNORMAL LOW (ref >90)
Potassium: 3.5 mEq/L (ref 3.5–5.1)
Sodium: 140 mEq/L (ref 135–145)

## 2011-09-23 LAB — FOLATE: Folate: 16.3 ng/mL

## 2011-09-23 LAB — FERRITIN: Ferritin: 11 ng/mL (ref 10–291)

## 2011-09-23 LAB — CARDIAC PANEL(CRET KIN+CKTOT+MB+TROPI)
Relative Index: INVALID (ref 0.0–2.5)
Relative Index: INVALID (ref 0.0–2.5)
Troponin I: <0.3 ng/mL (ref <0.30)

## 2011-09-23 LAB — TSH: TSH: 0.663 u[IU]/mL (ref 0.350–4.500)

## 2011-09-23 LAB — RETICULOCYTES
RBC.: 2.94 MIL/uL — ABNORMAL LOW (ref 3.87–5.11)
Retic Count, Absolute: 67.6 10*3/uL (ref 19.0–186.0)

## 2011-09-23 LAB — HEMOGLOBIN A1C: Mean Plasma Glucose: 166 mg/dL — ABNORMAL HIGH (ref <117)

## 2011-09-23 LAB — MRSA PCR SCREENING: MRSA by PCR: NEGATIVE

## 2011-09-23 LAB — IRON AND TIBC: Iron: <10 ug/dL — ABNORMAL LOW (ref 42–135)

## 2011-09-23 LAB — PRO B NATRIURETIC PEPTIDE: Pro B Natriuretic peptide (BNP): 1665 pg/mL — ABNORMAL HIGH (ref 0–450)

## 2011-09-23 MED ORDER — CIPROFLOXACIN IN D5W 400 MG/200ML IV SOLN
400.0000 mg | Freq: Two times a day (BID) | INTRAVENOUS | Status: DC
  Administered 2011-09-23 – 2011-09-26 (×6): 400 mg via INTRAVENOUS
  Filled 2011-09-23 (×6): qty 200

## 2011-09-23 MED ORDER — POTASSIUM CHLORIDE CRYS ER 20 MEQ PO TBCR
20.0000 meq | EXTENDED_RELEASE_TABLET | Freq: Every day | ORAL | Status: DC
  Administered 2011-09-23 – 2011-09-28 (×6): 20 meq via ORAL
  Filled 2011-09-23 (×6): qty 1

## 2011-09-23 MED ORDER — FUROSEMIDE 10 MG/ML IJ SOLN
20.0000 mg | Freq: Every day | INTRAMUSCULAR | Status: DC
  Administered 2011-09-23: 20 mg via INTRAVENOUS
  Filled 2011-09-23 (×2): qty 2

## 2011-09-23 MED ORDER — ROPINIROLE HCL 1 MG PO TABS
2.0000 mg | ORAL_TABLET | Freq: Four times a day (QID) | ORAL | Status: DC
  Administered 2011-09-23 – 2011-09-28 (×22): 2 mg via ORAL
  Filled 2011-09-23 (×25): qty 2

## 2011-09-23 NOTE — Progress Notes (Signed)
Subjective:  Patient complains of dysuria and irritation at a Foley catheter Denies fever chills. No further black stools. Denies chest pain breathing has improved Objective:  Vital Signs in the last 24 hours: Temp:  [97.4 F (36.3 C)-99.6 F (37.6 C)] 97.7 F (36.5 C) (05/22 1600) Pulse Rate:  [79-93] 82  (05/22 1500) Resp:  [17-20] 18  (05/22 1500) BP: (103-147)/(47-79) 103/60 mmHg (05/22 1500) SpO2:  [94 %-100 %] 97 % (05/22 1500) Weight:  [100.2 kg (220 lb 14.4 oz)] 100.2 kg (220 lb 14.4 oz) (05/21 2104)  Intake/Output from previous day: 05/21 0701 - 05/22 0700 In: 161.7 [P.O.:120; I.V.:41.7] Out: 3500 [Urine:3500] Intake/Output from this shift: Total I/O In: 705 [P.O.:480; I.V.:225] Out: 2350 [Urine:2350]  Physical Exam: Neck: no adenopathy, no carotid bruit, no JVD and supple, symmetrical, trachea midline Lungs: Decreased breath sound at bases with occasional rales Heart: irregularly irregular rhythm, S1, S2 normal and Soft systolic murmur and S3 gallop noted Abdomen: soft, non-tender; bowel sounds normal; no masses,  no organomegaly Extremities: No clubbing cyanosis trace edema noted  Lab Results:  Basename 09/23/11 0540 09/22/11 2240 09/22/11 1605  WBC 2.5* -- 2.5*  HGB 7.2* 6.8* --  PLT 200 -- 223    Basename 09/23/11 0540 09/22/11 1615  NA 140 141  K 3.5 4.3  CL 103 108  CO2 28 --  GLUCOSE 130* 159*  BUN 21 31*  CREATININE 0.79 0.80    Basename 09/23/11 1444 09/23/11 0540  TROPONINI <0.30 <0.30   Hepatic Function Panel No results found for this basename: PROT,ALBUMIN,AST,ALT,ALKPHOS,BILITOT,BILIDIR,IBILI in the last 72 hours  Basename 09/23/11 0540  CHOL 103   No results found for this basename: PROTIME in the last 72 hours  Imaging: Imaging results have been reviewed and X-ray Chest Pa And Lateral   09/23/2011  *RADIOLOGY REPORT*  Clinical Data: Shortness of breath, rule out pneumonia  CHEST - 2 VIEW  Comparison: 09/22/2011  Findings:  Cardiomegaly again noted.  Central mild vascular congestion without convincing pulmonary edema.  Stable osteopenia and mild degenerative changes thoracic spine.  Question small left pleural effusion.  No focal infiltrate.  Mild basilar atelectasis.  IMPRESSION: Central mild vascular congestion without convincing pulmonary edema.  Cardiomegaly again noted.  Question small left pleural effusion with left basilar atelectasis.  Original Report Authenticated By: Natasha Mead, M.D.   Dg Chest 2 View  09/22/2011  *RADIOLOGY REPORT*  Clinical Data: Shortness of breath  CHEST - 2 VIEW  Comparison: 04/12/2011  Findings: Cardiomegaly with pulmonary vascular congestion and suspected mild interstitial edema.  Additional focal opacity in the right lower lung, possibly asymmetric edema, underlying pneumonia not excluded.  No pneumothorax.  Degenerative changes of the visualized thoracolumbar spine.  IMPRESSION: Cardiomegaly with pulmonary vascular congestion and suspected mild interstitial edema.  Additional focal opacity in the right lower lung, possibly asymmetric edema, underlying pneumonia not excluded.  Follow-up radiographs are suggested to document resolution.  Original Report Authenticated By: Charline Bills, M.D.    Cardiac Studies:  Assessment/Plan:  Mild decompensated diastolic heart failure which is exacerbated by A. fib/anemia  New-onset A. fib CHA DS score 5  Acute on chronic anemia rule out GI loss  Hypertension  Insulin requiring diabetes mellitus  History of CVA with left paresis in the 1998/history of TIA in 2012  Degenerative joint disease  Restless leg syndrome  History of questionable peptic ulcer disease/GI TS  Hypercholesteremia Plan Check UA and urine culture DC Foley Start Cipro IV Check labs in a.m.  LOS: 1 day    Elizabeth Pugh 09/23/2011, 6:11 PM

## 2011-09-23 NOTE — Progress Notes (Signed)
CARE MANAGEMENT NOTE 09/23/2011  Patient:  Elizabeth Pugh, Elizabeth Pugh   Account Number:  [id number]  Date Initiated:  09/23/2011  Documentation initiated by:  Malita Ignasiak  Subjective/Objective Assessment:   patient with multiple medical problems including a.fib and hx of cva, increasede weakness, occult stool positive, hgb 7.4, hypotensive with iv fld boluses given.     Action/Plan:   lives with daughter at home.   Anticipated DC Date:  09/26/2011   Anticipated DC Plan:  HOME/SELF CARE  In-house referral  NA      DC Planning Services  NA      [facility] Choice  NA   Choice offered to / List presented to:  NA   DME arranged  NA      DME agency  NA     HH arranged  NA      HH agency  NA   Status of service:  In process, will continue to follow Medicare Important Message given?  YES (If response is "NO", the following Medicare IM given date fields will be blank) Date Medicare IM given:  09/22/2011 Date Additional Medicare IM given:    Discharge Disposition:    Per UR Regulation:  Reviewed for med. necessity/level of care/duration of stay  If discussed at Long Length of Stay Meetings, dates discussed:    Comments:  [id number]/Kellyann Ordway Earlene Plater, RN, BSN, CCM No discharge needs present at time of this review at the sdu/icu level. Case Management [phone]

## 2011-09-23 NOTE — Progress Notes (Signed)
  Echocardiogram 2D Echocardiogram has been performed.  Cathie Beams Deneen 09/23/2011, 10:12 AM

## 2011-09-23 NOTE — Clinical Documentation Improvement (Signed)
Anemia Blood Loss Clarification  THIS DOCUMENT IS NOT A PERMANENT PART OF THE MEDICAL RECORD  RESPOND TO THE THIS QUERY, FOLLOW THE INSTRUCTIONS BELOW:  1. If needed, update documentation for the patient's encounter via the notes activity.  2. Access this query again and click edit on the In [employer].  3. After updating, or not, click F2 to complete all highlighted (required) fields concerning your review. Select "additional documentation in the medical record" OR "no additional documentation provided".  4. Click Sign note button.  5. The deficiency will fall out of your In Basket *Please let us know if you are not able to complete this workflow by phone or e-mail (listed below).        09/23/11  Dear Dr.[facility]  In an effort to better capture your patient's severity of illness, reflect appropriate length of stay and utilization of resources, a review of the patient medical record has revealed the following indicators.    Based on your clinical judgment, please clarify and document in a progress note and/or discharge summary the clinical condition associated with the following supporting information:  In responding to this query please exercise your independent judgment.  The fact that a query is asked, does not imply that any particular answer is desired or expected.  09/23/11 Progr note.Marland KitchenMarland Kitchen"Acute on chronic anemia rule out GI loss..." For accurate Dx specificity & severity can noted "acute on chronic anemia" but further specified. Thank you   Possible Clinical Conditions?  " Expected Acute Blood Loss Anemia  " Acute Blood Loss Anemia " Acute on chronic blood loss anemia  " Chronic blood loss anemia  " Precipitous drop in Hematocrit  " Other Condition  " Cannot Clinically Determine  Risk Factors: (recent surgery, pre op anemia, EBL in OR)  Supporting Information: Risk Factors:  09/22/11 progr note.Marland Kitchen."ER patient...was noted to be significantly anemic with  hemoglobin of 7.4 ."...  Signs and Symptoms: 09/22/11 per H&P..." developed some weakness...", melena  Diagnostics: 5/21: HGB 8.8* 7.4*  5/21: HCT 26.0* 24.2*   Treatments: 5/21/13Cons note... "Plan: 1) Agree with blood transfusions. 2) Monitor HGB. 3) Clear liquid diet for now. 4) EGD/EUS on Thursday.".Marland KitchenMarland Kitchen  Medications (Fe, Procrit)    Reviewed: additional documentation in the medical record Acute on chronic anemia probably secondary to upper GI bleed Thank You,  Toribio Harbour, RN, BSN, CCDS Certified Clinical Documentation Specialist Pager: [phone]  Health Information Management [place]

## 2011-09-23 NOTE — Progress Notes (Signed)
Subjective: No acute events.  Objective: Vital signs in last 24 hours: Temp:  [97.4 F (36.3 C)-99.6 F (37.6 C)] 97.4 F (36.3 C) (05/22 0401) Pulse Rate:  [82-97] 93  (05/22 0401) Resp:  [17-22] 17  (05/22 0401) BP: (118-147)/(61-79) 131/76 mmHg (05/22 0401) SpO2:  [94 %-100 %] 98 % (05/22 0401) FiO2 (%):  [2 %] 2 % (05/21 1522) Weight:  [100.2 kg (220 lb 14.4 oz)] 100.2 kg (220 lb 14.4 oz) (05/21 2104) Last BM Date: 09/22/11  Intake/Output from previous day: 05/21 0701 - 05/22 0700 In: 136.7 [P.O.:120; I.V.:16.7] Out: 3500 [Urine:3500] Intake/Output this shift:    General appearance: alert and no distress GI: soft, non-tender; bowel sounds normal; no masses,  no organomegaly  Lab Results:  Basename 09/23/11 0540 09/22/11 2240 09/22/11 1615 09/22/11 1605  WBC 2.5* -- -- 2.5*  HGB 7.2* 6.8* 8.8* --  HCT 23.7* 22.8* 26.0* --  PLT 200 -- -- 223   BMET  Basename 09/23/11 0540 09/22/11 1615  NA 140 141  K 3.5 4.3  CL 103 108  CO2 28 --  GLUCOSE 130* 159*  BUN 21 31*  CREATININE 0.79 0.80  CALCIUM 9.5 --   LFT No results found for this basename: PROT,ALBUMIN,AST,ALT,ALKPHOS,BILITOT,BILIDIR,IBILI in the last 72 hours PT/INR  Basename 09/23/11 0540  LABPROT 15.7*  INR 1.22   Hepatitis Panel No results found for this basename: HEPBSAG,HCVAB,HEPAIGM,HEPBIGM in the last 72 hours C-Diff No results found for this basename: CDIFFTOX:3 in the last 72 hours Fecal Lactopherrin No results found for this basename: FECLLACTOFRN in the last 72 hours  Studies/Results: Dg Chest 2 View  09/22/2011  *RADIOLOGY REPORT*  Clinical Data: Shortness of breath  CHEST - 2 VIEW  Comparison: 04/12/2011  Findings: Cardiomegaly with pulmonary vascular congestion and suspected mild interstitial edema.  Additional focal opacity in the right lower lung, possibly asymmetric edema, underlying pneumonia not excluded.  No pneumothorax.  Degenerative changes of the visualized thoracolumbar  spine.  IMPRESSION: Cardiomegaly with pulmonary vascular congestion and suspected mild interstitial edema.  Additional focal opacity in the right lower lung, possibly asymmetric edema, underlying pneumonia not excluded.  Follow-up radiographs are suggested to document resolution.  Original Report Authenticated By: Charline Bills, M.D.    Medications:  Scheduled:   . albuterol  5 mg Nebulization Once  . carvedilol  20 mg Oral Daily  . furosemide  40 mg Intravenous Once  . insulin aspart  0-9 Units Subcutaneous Q4H  . rOPINIRole  2 mg Oral QID  . sodium chloride  3 mL Intravenous Q12H  . sodium chloride  3 mL Intravenous Q12H   Continuous:   . pantoprozole (PROTONIX) infusion 8 mg/hr (09/23/11 0981)    Assessment/Plan: 1) GI bleed - ? Gastric source. 2) Mild CHF. 3) Afib.   She has a mild CHF, but I do not feel that this will preclude an endoscopic evaluation.  Additionally, a GI work up is required to help determine the anticoagulation issue from Dr. Annitta Jersey standpoint.  Plan: 1) EGD/EUS tomorrow.    LOS: 1 day   Navreet Bolda D 09/23/2011, 8:04 AM

## 2011-09-24 ENCOUNTER — Encounter (HOSPITAL_COMMUNITY): Payer: Self-pay | Admitting: *Deleted

## 2011-09-24 ENCOUNTER — Encounter (HOSPITAL_COMMUNITY)
Admission: EM | Disposition: A | Discharge status: Home / Self Care | Payer: Self-pay | Source: Home / Self Care | Attending: Cardiology

## 2011-09-24 HISTORY — PX: EUS: SHX5427

## 2011-09-24 LAB — GLUCOSE, CAPILLARY
Glucose-Capillary: 100 mg/dL — ABNORMAL HIGH (ref 70–99)
Glucose-Capillary: 133 mg/dL — ABNORMAL HIGH (ref 70–99)
Glucose-Capillary: 125 mg/dL — ABNORMAL HIGH (ref 70–99)
Glucose-Capillary: 140 mg/dL — ABNORMAL HIGH (ref 70–99)
Glucose-Capillary: 143 mg/dL — ABNORMAL HIGH (ref 70–99)

## 2011-09-24 LAB — BASIC METABOLIC PANEL
CO2: 27 mEq/L (ref 19–32)
Calcium: 9 mg/dL (ref 8.4–10.5)
Chloride: 101 mEq/L (ref 96–112)
Glucose, Bld: 106 mg/dL — ABNORMAL HIGH (ref 70–99)
Sodium: 137 mEq/L (ref 135–145)

## 2011-09-24 LAB — CBC
HCT: 22.8 % — ABNORMAL LOW (ref 36.0–46.0)
Hemoglobin: 7 g/dL — ABNORMAL LOW (ref 12.0–15.0)
MCH: 24.6 pg — ABNORMAL LOW (ref 26.0–34.0)
MCV: 80.3 fL (ref 78.0–100.0)
Platelets: 202 10*3/uL (ref 150–400)
RBC: 2.84 MIL/uL — ABNORMAL LOW (ref 3.87–5.11)

## 2011-09-24 LAB — URINE CULTURE
Colony Count: NO GROWTH
Culture  Setup Time: 201305230147
Culture: NO GROWTH

## 2011-09-24 SURGERY — UPPER ENDOSCOPIC ULTRASOUND (EUS) LINEAR
Anesthesia: Moderate Sedation

## 2011-09-24 MED ORDER — MIDAZOLAM HCL 10 MG/2ML IJ SOLN
INTRAMUSCULAR | Status: DC | PRN
  Administered 2011-09-24 (×3): 2 mg via INTRAVENOUS

## 2011-09-24 MED ORDER — FUROSEMIDE 10 MG/ML IJ SOLN
40.0000 mg | Freq: Every day | INTRAMUSCULAR | Status: DC
  Administered 2011-09-24 – 2011-09-28 (×5): 40 mg via INTRAVENOUS
  Filled 2011-09-24 (×5): qty 4

## 2011-09-24 MED ORDER — FENTANYL CITRATE 0.05 MG/ML IJ SOLN
INTRAMUSCULAR | Status: DC | PRN
  Administered 2011-09-24 (×3): 25 ug via INTRAVENOUS

## 2011-09-24 MED ORDER — GLUCAGON HCL (RDNA) 1 MG IJ SOLR
INTRAMUSCULAR | Status: AC
  Filled 2011-09-24: qty 1

## 2011-09-24 MED ORDER — MIDAZOLAM HCL 10 MG/2ML IJ SOLN
INTRAMUSCULAR | Status: AC
  Filled 2011-09-24: qty 4

## 2011-09-24 MED ORDER — FENTANYL CITRATE 0.05 MG/ML IJ SOLN
INTRAMUSCULAR | Status: AC
  Filled 2011-09-24: qty 4

## 2011-09-24 NOTE — Progress Notes (Signed)
Subjective:  Patient denies any chest pain or shortness of breath dysuria resolved complains of itching and IV site in the left forearm  Objective:  Vital Signs in the last 24 hours: Temp:  [97.4 F (36.3 C)-98.5 F (36.9 C)] 98 F (36.7 C) (05/23 0855) Pulse Rate:  [75-93] 75  (05/23 0800) Resp:  [14-19] 19  (05/23 0800) BP: (103-133)/(56-75) 132/64 mmHg (05/23 0855) SpO2:  [94 %-99 %] 97 % (05/23 0800) Weight:  [96.9 kg (213 lb 10 oz)] 96.9 kg (213 lb 10 oz) (05/23 0000)  Intake/Output from previous day: 05/22 0701 - 05/23 0700 In: 1320 [P.O.:720; I.V.:600] Out: 2500 [Urine:2500] Intake/Output from this shift: Total I/O In: 562.5 [I.V.:250; Blood:312.5] Out: -   Physical Exam: Neck: no adenopathy, no carotid bruit, no JVD and supple, symmetrical, trachea midline Lungs: Decreased breath sound at bases Heart: irregularly irregular rhythm, S1, S2 normal and Soft systolic murmur and S3 gallop noted Abdomen: soft, non-tender; bowel sounds normal; no masses,  no organomegaly Extremities: extremities normal, atraumatic, no cyanosis or edema  Lab Results:  Basename 09/24/11 0335 09/23/11 0540  WBC 2.9* 2.5*  HGB 7.0* 7.2*  PLT 202 200    Basename 09/24/11 0335 09/23/11 0540  NA 137 140  K 3.6 3.5  CL 101 103  CO2 27 28  GLUCOSE 106* 130*  BUN 19 21  CREATININE 0.95 0.79    Basename 09/23/11 1444 09/23/11 0540  TROPONINI <0.30 <0.30   Hepatic Function Panel No results found for this basename: PROT,ALBUMIN,AST,ALT,ALKPHOS,BILITOT,BILIDIR,IBILI in the last 72 hours  Basename 09/23/11 0540  CHOL 103   No results found for this basename: PROTIME in the last 72 hours  Imaging: Imaging results have been reviewed and X-ray Chest Pa And Lateral   09/23/2011  *RADIOLOGY REPORT*  Clinical Data: Shortness of breath, rule out pneumonia  CHEST - 2 VIEW  Comparison: 09/22/2011  Findings: Cardiomegaly again noted.  Central mild vascular congestion without convincing pulmonary  edema.  Stable osteopenia and mild degenerative changes thoracic spine.  Question small left pleural effusion.  No focal infiltrate.  Mild basilar atelectasis.  IMPRESSION: Central mild vascular congestion without convincing pulmonary edema.  Cardiomegaly again noted.  Question small left pleural effusion with left basilar atelectasis.  Original Report Authenticated By: Natasha Mead, M.D.   Dg Chest 2 View  09/22/2011  *RADIOLOGY REPORT*  Clinical Data: Shortness of breath  CHEST - 2 VIEW  Comparison: 04/12/2011  Findings: Cardiomegaly with pulmonary vascular congestion and suspected mild interstitial edema.  Additional focal opacity in the right lower lung, possibly asymmetric edema, underlying pneumonia not excluded.  No pneumothorax.  Degenerative changes of the visualized thoracolumbar spine.  IMPRESSION: Cardiomegaly with pulmonary vascular congestion and suspected mild interstitial edema.  Additional focal opacity in the right lower lung, possibly asymmetric edema, underlying pneumonia not excluded.  Follow-up radiographs are suggested to document resolution.  Original Report Authenticated By: Charline Bills, M.D.    Cardiac Studies:  Assessment/Plan:  Mild decompensated diastolic heart failure which is exacerbated by A. fib/anemia improved New-onset A. fib CHA DS score 5  Acute on chronic anemia rule out GI loss  Hypertension  Insulin requiring diabetes mellitus  History of CVA with left paresis in the 1998/history of TIA in 2012  Degenerative joint disease  Restless leg syndrome  History of questionable peptic ulcer disease/GI TS  Hypercholesteremia Probable UTI Plan Type and crossmatch and transfuse one unit of packed RBC Patient schedule for EGD today Check CBC in a.m.  LOS:  2 days    Elizabeth Pugh 09/24/2011, 9:39 AM

## 2011-09-24 NOTE — Op Note (Signed)
[name] [address] [place], [place]  [address]  ENDOSCOPIC ULTRASOUND PROCEDURE REPORT  PATIENT:  Elizabeth, Pugh  MR#:  [id number] BIRTHDATE:  01/04/1927  GENDER:  female ENDOSCOPIST:  Jeani Hawking, MD PROCEDURE DATE:  09/24/2011 PROCEDURE:  Upper EUS/EGD with APC ASA CLASS:  Class III INDICATIONS:  Melena and history of an antral submucosal mass. MEDICATIONS:   Fentanyl 75 mcg IV, Versed 7.5 mg IV  DESCRIPTION OF PROCEDURE:   After the risks benefits and alternatives of the procedure were  explained, informed consent was obtained. The patient was then placed in the left, lateral, decubitus postion and IV sedation was administered. Throughout the procedure, the patient's blood pressure, pulse and oxygen saturations were monitored continuously.  Under direct visualization, the [id number] endoscope was introduced through the mouth and advanced to the second portion of the duodenum.  Water was used as necessary to provide an acoustic interface.  Upon completion of the imaging, water was removed and the patient was sent to the recovery room in satisfactory condition. <<PROCEDUREIMAGES>>  FINDINGS:   In the antrum there was evidence of a 1.5-2 cm submucosal lesion. With the EUS it appears to be a lipoma. It was hyperechoic. Endoscopic examination failed to reveal any site of bleeding from this lesion. In the second portion of the duodenum a small nonbleeding AVM was identified and ablated with APC. No other abnormalities noted.    The scope was then withdrawn from the patient and the procedure completed.  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 RECOMMENDATIONS: 1) Follow HGB. 2) Transfuse if necessary. 3) Okay to start coumadin for her afib in 2-3 days.  ______________________________ Jeani Hawking, MD  n. Rosalie DoctorJeani Hawking at 09/24/2011 05:45 PM  Francella Solian, [phone]

## 2011-09-25 ENCOUNTER — Encounter (HOSPITAL_COMMUNITY): Payer: Self-pay

## 2011-09-25 ENCOUNTER — Encounter (HOSPITAL_COMMUNITY): Payer: Self-pay | Admitting: Gastroenterology

## 2011-09-25 LAB — BASIC METABOLIC PANEL
BUN: 19 mg/dL (ref 6–23)
Chloride: 100 mEq/L (ref 96–112)
Creatinine, Ser: 0.88 mg/dL (ref 0.50–1.10)
GFR calc Af Amer: 69 mL/min — ABNORMAL LOW (ref >90)
GFR calc non Af Amer: 59 mL/min — ABNORMAL LOW (ref >90)

## 2011-09-25 LAB — CBC
HCT: 26.7 % — ABNORMAL LOW (ref 36.0–46.0)
MCHC: 30.7 g/dL (ref 30.0–36.0)
Platelets: 193 10*3/uL (ref 150–400)
RDW: 16 % — ABNORMAL HIGH (ref 11.5–15.5)
WBC: 3.3 10*3/uL — ABNORMAL LOW (ref 4.0–10.5)

## 2011-09-25 LAB — GLUCOSE, CAPILLARY: Glucose-Capillary: 169 mg/dL — ABNORMAL HIGH (ref 70–99)

## 2011-09-25 LAB — TYPE AND SCREEN: ABO/RH(D): A POS

## 2011-09-25 MED ORDER — INSULIN ASPART 100 UNIT/ML ~~LOC~~ SOLN
0.0000 [IU] | Freq: Three times a day (TID) | SUBCUTANEOUS | Status: DC
  Administered 2011-09-25 (×2): 2 [IU] via SUBCUTANEOUS
  Administered 2011-09-25: 3 [IU] via SUBCUTANEOUS
  Administered 2011-09-26: 2 [IU] via SUBCUTANEOUS
  Administered 2011-09-26: 5 [IU] via SUBCUTANEOUS
  Administered 2011-09-26 – 2011-09-27 (×4): 2 [IU] via SUBCUTANEOUS
  Administered 2011-09-28: 1 [IU] via SUBCUTANEOUS
  Administered 2011-09-28: 5 [IU] via SUBCUTANEOUS

## 2011-09-25 NOTE — Progress Notes (Signed)
Subjective: No acute events.  Objective: Vital signs in last 24 hours: Temp:  [97.6 F (36.4 C)-98.6 F (37 C)] 97.6 F (36.4 C) (05/24 1200) Pulse Rate:  [72-95] 80  (05/24 0700) Resp:  [14-27] 22  (05/24 0700) BP: (90-167)/(43-93) 135/70 mmHg (05/24 0700) SpO2:  [87 %-100 %] 99 % (05/24 0700) Weight:  [94.7 kg (208 lb 12.4 oz)] 94.7 kg (208 lb 12.4 oz) (05/24 0400) Last BM Date: 09/25/11  Intake/Output from previous day: 05/23 0701 - 05/24 0700 In: 1604.5 [I.V.:888; Blood:312.5; IV Piggyback:404] Out: 550 [Urine:550] Intake/Output this shift: Total I/O In: 200 [IV Piggyback:200] Out: 401 [Urine:400; Stool:1]  General appearance: alert and no distress GI: soft, non-tender; bowel sounds normal; no masses,  no organomegaly  Lab Results:  Basename 09/25/11 0325 09/24/11 0335 09/23/11 0540  WBC 3.3* 2.9* 2.5*  HGB 8.2* 7.0* 7.2*  HCT 26.7* 22.8* 23.7*  PLT 193 202 200   BMET  Basename 09/25/11 0325 09/24/11 0335 09/23/11 0540  NA 135 137 140  K 3.9 3.6 3.5  CL 100 101 103  CO2 28 27 28   GLUCOSE 143* 106* 130*  BUN 19 19 21   CREATININE 0.88 0.95 0.79  CALCIUM 8.8 9.0 9.5   LFT No results found for this basename: PROT,ALBUMIN,AST,ALT,ALKPHOS,BILITOT,BILIDIR,IBILI in the last 72 hours PT/INR  Basename 09/23/11 0540  LABPROT 15.7*  INR 1.22   Hepatitis Panel No results found for this basename: HEPBSAG,HCVAB,HEPAIGM,HEPBIGM in the last 72 hours C-Diff No results found for this basename: CDIFFTOX:3 in the last 72 hours Fecal Lactopherrin No results found for this basename: FECLLACTOFRN in the last 72 hours  Studies/Results: No results found.  Medications:  Scheduled:   . carvedilol  20 mg Oral Daily  . ciprofloxacin  400 mg Intravenous BID  . furosemide  40 mg Intravenous Daily  . insulin aspart  0-9 Units Subcutaneous TID WC  . potassium chloride  20 mEq Oral Daily  . rOPINIRole  2 mg Oral QID  . sodium chloride  3 mL Intravenous Q12H  . sodium  chloride  3 mL Intravenous Q12H  . DISCONTD: insulin aspart  0-9 Units Subcutaneous Q4H   Continuous:   . pantoprozole (PROTONIX) infusion 8 mg/hr (09/25/11 1100)    Assessment/Plan: 1) Antral lipoma. 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  Plan: 1) Anticoagulation per Dr. Sharyn Lull in 1-2 days. 2) Follow up with Dr. Loreta Ave in one month.  LOS: 3 days   Elizabeth Pugh 09/25/2011, 1:16 PM

## 2011-09-25 NOTE — Progress Notes (Signed)
Inpatient Diabetes Program Recommendations  AACE/ADA: New Consensus Statement on Inpatient Glycemic Control (2009)  Target Ranges:  Prepandial:   less than 140 mg/dL      Peak postprandial:   less than 180 mg/dL (1-2 hours)      Critically ill patients:  140 - 180 mg/dL   Reason for Visit: Hyperglycemia  Patient is a 78 y.o. female presenting with shortness of breath, cough, and weakness.  Hx Type II DM on metformin and Actos at home. HgbA1C up from 04/13/2011.    Results for ICELA, GLYMPH (MRN [id number]) as of 09/25/2011 10:54  Ref. Range 04/13/2011 05:00 09/22/2011 19:42  Hemoglobin A1C Latest Range: <5.7 % 6.7 (H) 7.4 (H)  Results for ADLEAN, HARDEMAN (MRN [id number]) as of 09/25/2011 10:54  Ref. Range 09/25/2011 00:10 09/25/2011 03:27 09/25/2011 08:33  Glucose-Capillary Latest Range: 70-99 mg/dL 578 (H) 469 (H) 629 (H)      Note: Add CHO-mod med to low sodium diet. ? Whether pt will need insulin at home.  Will continue to follow.

## 2011-09-25 NOTE — Progress Notes (Signed)
Subjective:  Patient denies any chest pain states breathing has improved no further GI bleeding. Patient underwent EGD yesterday her noted to have a submucosal lipoma with no evidence of bleeding and the was not bleeding AVM in the chart numb which was ablated with APC without any problems Objective:  Vital Signs in the last 24 hours: Temp:  [97.6 F (36.4 C)-98.6 F (37 C)] 97.6 F (36.4 C) (05/24 1200) Pulse Rate:  [72-104] 80  (05/24 0700) Resp:  [14-27] 22  (05/24 0700) BP: (90-167)/(43-93) 135/70 mmHg (05/24 0700) SpO2:  [87 %-100 %] 99 % (05/24 0700) Weight:  [94.7 kg (208 lb 12.4 oz)] 94.7 kg (208 lb 12.4 oz) (05/24 0400)  Intake/Output from previous day: 05/23 0701 - 05/24 0700 In: 1604.5 [I.V.:888; Blood:312.5; IV Piggyback:404] Out: 550 [Urine:550] Intake/Output from this shift: Total I/O In: 200 [IV Piggyback:200] Out: 401 [Urine:400; Stool:1]  Physical Exam: Neck: no adenopathy, no carotid bruit, no JVD and supple, symmetrical, trachea midline Lungs: clear to auscultation bilaterally Heart: regular rate and rhythm, S1, S2 normal, no murmur, click, rub or gallop Abdomen: soft, non-tender; bowel sounds normal; no masses,  no organomegaly Extremities: extremities normal, atraumatic, no cyanosis or edema  Lab Results:  Basename 09/25/11 0325 09/24/11 0335  WBC 3.3* 2.9*  HGB 8.2* 7.0*  PLT 193 202    Basename 09/25/11 0325 09/24/11 0335  NA 135 137  K 3.9 3.6  CL 100 101  CO2 28 27  GLUCOSE 143* 106*  BUN 19 19  CREATININE 0.88 0.95    Basename 09/23/11 1444 09/23/11 0540  TROPONINI <0.30 <0.30   Hepatic Function Panel No results found for this basename: PROT,ALBUMIN,AST,ALT,ALKPHOS,BILITOT,BILIDIR,IBILI in the last 72 hours  Basename 09/23/11 0540  CHOL 103   No results found for this basename: PROTIME in the last 72 hours  Imaging: Imaging results have been reviewed and No results found.  Cardiac Studies:  Assessment/Plan:  Compensated  diastolic heart failure which is exacerbated by A. fib/anemia improved  New-onset A. fib CHA DS score 5  Acute on chronic anemia probably secondary to duodenal AVM status post APC stable Hypertension  Insulin requiring diabetes mellitus  History of CVA with left paresis in the 1998/history of TIA in 2012  Degenerative joint disease  Restless leg syndrome  History of questionable peptic ulcer disease/GI TS  Hypercholesteremia  Probable UTI Plan  continue present management Check CBC in a.m. Transfer to telemetry Will start anticoagulant in the they are to have hemoglobin remains stable as per GI  LOS: 3 days    Joleah Kosak N 09/25/2011, 12:29 PM

## 2011-09-26 LAB — CBC
HCT: 27.8 % — ABNORMAL LOW (ref 36.0–46.0)
MCHC: 30.9 g/dL (ref 30.0–36.0)
RDW: 16.3 % — ABNORMAL HIGH (ref 11.5–15.5)

## 2011-09-26 LAB — BASIC METABOLIC PANEL
BUN: 23 mg/dL (ref 6–23)
Calcium: 8.7 mg/dL (ref 8.4–10.5)
Chloride: 97 mEq/L (ref 96–112)
Creatinine, Ser: 1.09 mg/dL (ref 0.50–1.10)
GFR calc Af Amer: 53 mL/min — ABNORMAL LOW (ref >90)
GFR calc non Af Amer: 46 mL/min — ABNORMAL LOW (ref >90)

## 2011-09-26 LAB — GLUCOSE, CAPILLARY: Glucose-Capillary: 179 mg/dL — ABNORMAL HIGH (ref 70–99)

## 2011-09-26 MED ORDER — GUAIFENESIN-DM 100-10 MG/5ML PO SYRP: 10.0000 mL | ORAL_SOLUTION | ORAL | Status: DC | PRN

## 2011-09-26 MED ORDER — CIPROFLOXACIN HCL 500 MG PO TABS
500.0000 mg | ORAL_TABLET | Freq: Two times a day (BID) | ORAL | Status: DC
  Administered 2011-09-26 – 2011-09-28 (×4): 500 mg via ORAL
  Filled 2011-09-26 (×9): qty 1

## 2011-09-26 NOTE — Progress Notes (Signed)
PHARMACIST - PHYSICIAN COMMUNICATION DR:   Rebbeca Paul CONCERNING: Antibiotic IV to Oral Route Change Policy  RECOMMENDATION: This patient is receiving Cipro by the intravenous route.  Based on criteria approved by the Pharmacy and Therapeutics Committee, the antibiotic(s) is/are being converted to the equivalent oral dose form(s).   DESCRIPTION: These criteria include:  Patient being treated for a respiratory tract infection, urinary tract infection, or cellulitis  The patient is not neutropenic and does not exhibit a GI malabsorption state  The patient is eating (either orally or via tube) and/or has been taking other orally administered medications for a least 24 hours  The patient is improving clinically and has a Tmax < 100.5  If you have questions about this conversion, please contact the Pharmacy Department  []   [phone] )  Jeani Hawking []   [phone] )  [facility]  []   [phone] )  [facility] [x]   [phone] )  [facility]    Hessie Knows, PharmD, [place] Pager [phone] 09/26/2011 8:56 AM

## 2011-09-26 NOTE — Progress Notes (Signed)
Subjective:  Patient feels better overall. She denies chest pains or abdominal pain. She has had occasional nonproductive cough.Her appetite is still decreased. No other new complaints.   Allergies  Allergen Reactions  . Erythromycin Other (See Comments)    Reaction=vgina itches  . Penicillins Rash  . Zithromax (Azithromycin) Other (See Comments)    Pt states that it gives her a yeast infection.   Current Facility-Administered Medications  Medication Dose Route Frequency Provider Last Rate Last Dose  . 0.9 %  sodium chloride infusion  250 mL Intravenous PRN Rhetta Mura, MD      . carvedilol (COREG CR) 24 hr capsule 20 mg  20 mg Oral Daily Rhetta Mura, MD   20 mg at 09/26/11 1014  . ciprofloxacin (CIPRO) tablet 500 mg  500 mg Oral BID Berkley Harvey, PHARMD      . furosemide (LASIX) injection 40 mg  40 mg Intravenous Daily Robynn Pane, MD   40 mg at 09/26/11 1014  . insulin aspart (novoLOG) injection 0-9 Units  0-9 Units Subcutaneous TID WC Robynn Pane, MD   2 Units at 09/26/11 1740  . morphine 2 MG/ML injection 1 mg  1 mg Intravenous Q4H PRN Rhetta Mura, MD   1 mg at 09/25/11 1958  . pantoprazole (PROTONIX) 80 mg in sodium chloride 0.9 % 250 mL infusion  8 mg/hr Intravenous Continuous Rhetta Mura, MD 25 mL/hr at 09/26/11 1648 8 mg/hr at 09/26/11 1648  . potassium chloride SA (K-DUR,KLOR-CON) CR tablet 20 mEq  20 mEq Oral Daily Robynn Pane, MD   20 mEq at 09/26/11 1014  . rOPINIRole (REQUIP) tablet 2 mg  2 mg Oral QID Roma Kayser Schorr, NP   2 mg at 09/26/11 1741  . sodium chloride 0.9 % injection 3 mL  3 mL Intravenous Q12H Rhetta Mura, MD   3 mL at 09/25/11 1100  . sodium chloride 0.9 % injection 3 mL  3 mL Intravenous Q12H Rhetta Mura, MD   3 mL at 09/24/11 2105  . sodium chloride 0.9 % injection 3 mL  3 mL Intravenous PRN Rhetta Mura, MD      . DISCONTD: ciprofloxacin (CIPRO) IVPB 400 mg  400 mg Intravenous BID  Robynn Pane, MD   400 mg at 09/26/11 0744    Objective: Blood pressure 114/66, pulse 88, temperature 98.3 F (36.8 C), temperature source Oral, resp. rate 18, height 5\' 7"  (1.702 m), weight 208 lb 8.9 oz (94.6 kg), SpO2 97.00%.  Well-developed well-nourished. No acute distress. HEENT:no sinus tenderness. NECK:no posterior cervical nodes. LUNGS:bibasilar rales. No wheezes. ZO:XWRUEA S1, S2 without S3. ABD:no epigastric tenderness. VWU:JWJXB edema bilaterally negative Homans. NEURO:nonfocal.  Lab results: Results for orders placed during the hospital encounter of 09/22/11 (from the past 48 hour(s))  GLUCOSE, CAPILLARY     Status: Abnormal   Collection Time   09/25/11 12:10 AM      Component Value Range Comment   Glucose-Capillary 193 (*) 70 - 99 (mg/dL)    Comment 1 Notify RN     BASIC METABOLIC PANEL     Status: Abnormal   Collection Time   09/25/11  3:25 AM      Component Value Range Comment   Sodium 135  135 - 145 (mEq/L)    Potassium 3.9  3.5 - 5.1 (mEq/L)    Chloride 100  96 - 112 (mEq/L)    CO2 28  19 - 32 (mEq/L)    Glucose, Bld 143 (*) 70 -  99 (mg/dL)    BUN 19  6 - 23 (mg/dL)    Creatinine, Ser 8.11  0.50 - 1.10 (mg/dL)    Calcium 8.8  8.4 - 10.5 (mg/dL)    GFR calc non Af Amer 59 (*) >90 (mL/min)    GFR calc Af Amer 69 (*) >90 (mL/min)   CBC     Status: Abnormal   Collection Time   09/25/11  3:25 AM      Component Value Range Comment   WBC 3.3 (*) 4.0 - 10.5 (K/uL)    RBC 3.30 (*) 3.87 - 5.11 (MIL/uL)    Hemoglobin 8.2 (*) 12.0 - 15.0 (g/dL) POST TRANSFUSION SPECIMEN   HCT 26.7 (*) 36.0 - 46.0 (%)    MCV 80.9  78.0 - 100.0 (fL)    MCH 24.8 (*) 26.0 - 34.0 (pg)    MCHC 30.7  30.0 - 36.0 (g/dL)    RDW 91.4 (*) 78.2 - 15.5 (%)    Platelets 193  150 - 400 (K/uL)   GLUCOSE, CAPILLARY     Status: Abnormal   Collection Time   09/25/11  3:27 AM      Component Value Range Comment   Glucose-Capillary 143 (*) 70 - 99 (mg/dL)    Comment 1 Notify RN     GLUCOSE,  CAPILLARY     Status: Abnormal   Collection Time   09/25/11  8:33 AM      Component Value Range Comment   Glucose-Capillary 200 (*) 70 - 99 (mg/dL)    Comment 1 Documented in Chart      Comment 2 Notify RN     GLUCOSE, CAPILLARY     Status: Abnormal   Collection Time   09/25/11 11:44 AM      Component Value Range Comment   Glucose-Capillary 169 (*) 70 - 99 (mg/dL)    Comment 1 Documented in Chart      Comment 2 Notify RN     GLUCOSE, CAPILLARY     Status: Abnormal   Collection Time   09/25/11  4:42 PM      Component Value Range Comment   Glucose-Capillary 222 (*) 70 - 99 (mg/dL)   GLUCOSE, CAPILLARY     Status: Abnormal   Collection Time   09/25/11  9:56 PM      Component Value Range Comment   Glucose-Capillary 127 (*) 70 - 99 (mg/dL)   BASIC METABOLIC PANEL     Status: Abnormal   Collection Time   09/26/11  5:22 AM      Component Value Range Comment   Sodium 131 (*) 135 - 145 (mEq/L)    Potassium 4.3  3.5 - 5.1 (mEq/L)    Chloride 97  96 - 112 (mEq/L)    CO2 25  19 - 32 (mEq/L)    Glucose, Bld 184 (*) 70 - 99 (mg/dL)    BUN 23  6 - 23 (mg/dL)    Creatinine, Ser 9.56  0.50 - 1.10 (mg/dL)    Calcium 8.7  8.4 - 10.5 (mg/dL)    GFR calc non Af Amer 46 (*) >90 (mL/min)    GFR calc Af Amer 53 (*) >90 (mL/min)   CBC     Status: Abnormal   Collection Time   09/26/11  5:22 AM      Component Value Range Comment   WBC 3.5 (*) 4.0 - 10.5 (K/uL)    RBC 3.43 (*) 3.87 - 5.11 (MIL/uL)    Hemoglobin 8.6 (*) 12.0 -  15.0 (g/dL)    HCT 40.9 (*) 81.1 - 46.0 (%)    MCV 81.0  78.0 - 100.0 (fL)    MCH 25.1 (*) 26.0 - 34.0 (pg)    MCHC 30.9  30.0 - 36.0 (g/dL)    RDW 91.4 (*) 78.2 - 15.5 (%)    Platelets 216  150 - 400 (K/uL)   GLUCOSE, CAPILLARY     Status: Abnormal   Collection Time   09/26/11  8:05 AM      Component Value Range Comment   Glucose-Capillary 179 (*) 70 - 99 (mg/dL)   GLUCOSE, CAPILLARY     Status: Abnormal   Collection Time   09/26/11 11:58 AM      Component Value Range  Comment   Glucose-Capillary 272 (*) 70 - 99 (mg/dL)   GLUCOSE, CAPILLARY     Status: Abnormal   Collection Time   09/26/11  5:00 PM      Component Value Range Comment   Glucose-Capillary 186 (*) 70 - 99 (mg/dL)     Studies/Results: No results found.  Patient Active Problem List  Diagnoses  . Diabetes mellitus  . CVA, old, hemiparesis  . Hypertension  . Altered mental status    Impression: Congestive heart failure. Presently compensated. Acute on chronic anemia, symptomatic. Diabetes mellitus. Acute atrial fibrillation, improved.   Plan: Continue present therapy. Followup CBC, CMET, proBNP in a.m. Robitussin elixir for nonproductive cough.   August Saucer, Korver Graybeal 09/26/2011 10:07 PM

## 2011-09-27 DIAGNOSIS — M79609 Pain in unspecified limb: Secondary | ICD-10-CM

## 2011-09-27 LAB — CBC
MCH: 24.6 pg — ABNORMAL LOW (ref 26.0–34.0)
MCV: 82 fL (ref 78.0–100.0)
Platelets: 220 10*3/uL (ref 150–400)
RDW: 16.2 % — ABNORMAL HIGH (ref 11.5–15.5)

## 2011-09-27 LAB — COMPREHENSIVE METABOLIC PANEL
ALT: 7 U/L (ref 0–35)
AST: 11 U/L (ref 0–37)
CO2: 30 mEq/L (ref 19–32)
Chloride: 98 mEq/L (ref 96–112)
Creatinine, Ser: 0.96 mg/dL (ref 0.50–1.10)
GFR calc non Af Amer: 53 mL/min — ABNORMAL LOW (ref >90)
Total Bilirubin: 0.7 mg/dL (ref 0.3–1.2)

## 2011-09-27 LAB — GLUCOSE, CAPILLARY: Glucose-Capillary: 163 mg/dL — ABNORMAL HIGH (ref 70–99)

## 2011-09-27 NOTE — Progress Notes (Signed)
VASCULAR LAB PRELIMINARY  PRELIMINARY  PRELIMINARY  PRELIMINARY  Left lower extremity venous Doppler completed.    Preliminary report:  There is no DVT or SVT noted in the left lower extremity.  There is a small Baker's Cyst noted in the left popliteal fossa.  Elizabeth Pugh, 09/27/2011, 3:53 PM

## 2011-09-27 NOTE — Progress Notes (Signed)
Subjective:  Patient reports she's breathing better. Her cough has decreased considerably as well. Appetite is only slowly increasing. Nursing has reported that she has intermittent pain in her left leg. She takes Tylenol at home which is helped her great deal. No other new complaints. She continues to diurese with the IV Lasix.  Allergies  Allergen Reactions  . Erythromycin Other (See Comments)    Reaction=vgina itches  . Penicillins Rash  . Zithromax (Azithromycin) Other (See Comments)    Pt states that it gives her a yeast infection.   Current Facility-Administered Medications  Medication Dose Route Frequency Provider Last Rate Last Dose  . 0.9 %  sodium chloride infusion  250 mL Intravenous PRN Rhetta Mura, MD      . carvedilol (COREG CR) 24 hr capsule 20 mg  20 mg Oral Daily Rhetta Mura, MD   20 mg at 09/27/11 0925  . ciprofloxacin (CIPRO) tablet 500 mg  500 mg Oral BID Berkley Harvey, PHARMD   500 mg at 09/27/11 4782  . furosemide (LASIX) injection 40 mg  40 mg Intravenous Daily Robynn Pane, MD   40 mg at 09/27/11 0925  . guaiFENesin-dextromethorphan (ROBITUSSIN DM) 100-10 MG/5ML syrup 10 mL  10 mL Oral Q4H PRN Gwenyth Bender, MD      . insulin aspart (novoLOG) injection 0-9 Units  0-9 Units Subcutaneous TID WC Robynn Pane, MD   2 Units at 09/27/11 0734  . morphine 2 MG/ML injection 1 mg  1 mg Intravenous Q4H PRN Rhetta Mura, MD   1 mg at 09/27/11 0936  . pantoprazole (PROTONIX) 80 mg in sodium chloride 0.9 % 250 mL infusion  8 mg/hr Intravenous Continuous Rhetta Mura, MD 25 mL/hr at 09/27/11 0343 8 mg/hr at 09/27/11 0343  . potassium chloride SA (K-DUR,KLOR-CON) CR tablet 20 mEq  20 mEq Oral Daily Robynn Pane, MD   20 mEq at 09/27/11 0925  . rOPINIRole (REQUIP) tablet 2 mg  2 mg Oral QID Leanne Chang, NP   2 mg at 09/27/11 0924  . sodium chloride 0.9 % injection 3 mL  3 mL Intravenous Q12H Rhetta Mura, MD   3 mL at 09/25/11  1100  . sodium chloride 0.9 % injection 3 mL  3 mL Intravenous Q12H Rhetta Mura, MD   3 mL at 09/27/11 0927  . sodium chloride 0.9 % injection 3 mL  3 mL Intravenous PRN Rhetta Mura, MD        Objective: Blood pressure 110/75, pulse 67, temperature 98 F (36.7 C), temperature source Oral, resp. rate 18, height 5\' 7"  (1.702 m), weight 208 lb 8.9 oz (94.6 kg), SpO2 93.00%.  Well-developed well-nourished female in no acute distress. HEENT: No sinus tenderness. NECK: No JVD at 60. LUNGS: Clear without basilar rales. No vocal fremitus. CV: Irregularly irregular rhythm. Normal S1, S2. ABD: Soft nontender. MSK: Trace pitting edema. Negative Homans. Mild tenderness in the left calf region. No cords appreciated.Marland Kitchen NEURO: Nonfocal.  Lab results: Results for orders placed during the hospital encounter of 09/22/11 (from the past 48 hour(s))  GLUCOSE, CAPILLARY     Status: Abnormal   Collection Time   09/25/11 11:44 AM      Component Value Range Comment   Glucose-Capillary 169 (*) 70 - 99 (mg/dL)    Comment 1 Documented in Chart      Comment 2 Notify RN     GLUCOSE, CAPILLARY     Status: Abnormal   Collection Time   09/25/11  4:42 PM      Component Value Range Comment   Glucose-Capillary 222 (*) 70 - 99 (mg/dL)   GLUCOSE, CAPILLARY     Status: Abnormal   Collection Time   09/25/11  9:56 PM      Component Value Range Comment   Glucose-Capillary 127 (*) 70 - 99 (mg/dL)   BASIC METABOLIC PANEL     Status: Abnormal   Collection Time   09/26/11  5:22 AM      Component Value Range Comment   Sodium 131 (*) 135 - 145 (mEq/L)    Potassium 4.3  3.5 - 5.1 (mEq/L)    Chloride 97  96 - 112 (mEq/L)    CO2 25  19 - 32 (mEq/L)    Glucose, Bld 184 (*) 70 - 99 (mg/dL)    BUN 23  6 - 23 (mg/dL)    Creatinine, Ser 1.61  0.50 - 1.10 (mg/dL)    Calcium 8.7  8.4 - 10.5 (mg/dL)    GFR calc non Af Amer 46 (*) >90 (mL/min)    GFR calc Af Amer 53 (*) >90 (mL/min)   CBC     Status: Abnormal    Collection Time   09/26/11  5:22 AM      Component Value Range Comment   WBC 3.5 (*) 4.0 - 10.5 (K/uL)    RBC 3.43 (*) 3.87 - 5.11 (MIL/uL)    Hemoglobin 8.6 (*) 12.0 - 15.0 (g/dL)    HCT 09.6 (*) 04.5 - 46.0 (%)    MCV 81.0  78.0 - 100.0 (fL)    MCH 25.1 (*) 26.0 - 34.0 (pg)    MCHC 30.9  30.0 - 36.0 (g/dL)    RDW 40.9 (*) 81.1 - 15.5 (%)    Platelets 216  150 - 400 (K/uL)   GLUCOSE, CAPILLARY     Status: Abnormal   Collection Time   09/26/11  8:05 AM      Component Value Range Comment   Glucose-Capillary 179 (*) 70 - 99 (mg/dL)   GLUCOSE, CAPILLARY     Status: Abnormal   Collection Time   09/26/11 11:58 AM      Component Value Range Comment   Glucose-Capillary 272 (*) 70 - 99 (mg/dL)   GLUCOSE, CAPILLARY     Status: Abnormal   Collection Time   09/26/11  5:00 PM      Component Value Range Comment   Glucose-Capillary 186 (*) 70 - 99 (mg/dL)   GLUCOSE, CAPILLARY     Status: Abnormal   Collection Time   09/26/11  9:31 PM      Component Value Range Comment   Glucose-Capillary 168 (*) 70 - 99 (mg/dL)   GLUCOSE, CAPILLARY     Status: Abnormal   Collection Time   09/27/11  7:32 AM      Component Value Range Comment   Glucose-Capillary 163 (*) 70 - 99 (mg/dL)     Studies/Results: Pending.   Patient Active Problem List  Diagnoses  . Diabetes mellitus  . CVA, old, hemiparesis  . Hypertension  . Altered mental status    Impression: Congestive heart failure improved. Diabetes mellitus. Hypertension stable Acute on chronic anemia. CBC pending. Limb pain. Chronic by history. We'll obtain baseline Doppler.   Plan: Continue present therapy. Venous Doppler in a.m. Home soon if negative. Followup CBC, CMET.   August Saucer, Sydny Schnitzler 09/27/2011 11:26 AM

## 2011-09-28 LAB — GLUCOSE, CAPILLARY: Glucose-Capillary: 140 mg/dL — ABNORMAL HIGH (ref 70–99)

## 2011-09-28 MED ORDER — APIXABAN 2.5 MG PO TABS: 2.5000 mg | ORAL_TABLET | Freq: Two times a day (BID) | ORAL | Status: DC

## 2011-09-28 MED ORDER — ATORVASTATIN CALCIUM 10 MG PO TABS: 10.0000 mg | ORAL_TABLET | Freq: Every day | ORAL | Status: DC

## 2011-09-28 MED ORDER — APIXABAN 5 MG PO TABS: 5.0000 mg | ORAL_TABLET | Freq: Two times a day (BID) | ORAL | Status: DC

## 2011-09-28 MED ORDER — FERROUS SULFATE 325 (65 FE) MG PO TABS: 325.0000 mg | ORAL_TABLET | Freq: Three times a day (TID) | ORAL | Status: DC

## 2011-09-28 MED ORDER — ACETAMINOPHEN 325 MG PO TABS
650.0000 mg | ORAL_TABLET | ORAL | Status: DC | PRN
  Administered 2011-09-28 (×2): 650 mg via ORAL
  Filled 2011-09-28 (×2): qty 2

## 2011-09-28 NOTE — Discharge Instructions (Signed)

## 2011-09-28 NOTE — Discharge Summary (Signed)
  Discharge summary dictated on 09/28/2011 dictation number is [id number]

## 2011-09-28 NOTE — Discharge Summary (Signed)
Elizabeth Pugh, Elizabeth Pugh              ACCOUNT NO.:  [id number]  MEDICAL RECORD NO.:  [id number]  LOCATION:  1424                         FACILITY:  [facility]  PHYSICIAN:  Caitriona Sundquist N. Sharyn Lull, M.D. DATE OF BIRTH:  12/03/1926  DATE OF ADMISSION:  09/22/2011 DATE OF DISCHARGE:  09/28/2011                              DISCHARGE SUMMARY   ADMITTING DIAGNOSES: 1. Mild decompensated diastolic heart failure secondary to     exacerbation which is exacerbated by atrial fibrillation with rapid     ventricular response/acute on chronic anemia secondary to upper     gastrointestinal bleed. 2. New-onset atrial fibrillation. 3. Hypertension. 4. Insulin-requiring diabetes mellitus. 5. History of cerebrovascular accident with left paresis in 1998. 6. History of transient ischemic attack in 2012. 7. Degenerative joint disease. 8. Restless legs syndrome. 9. History of questionable peptic ulcer disease/gastrointestinal     therapeutic system. 10.Hypercholesteremia.  FINAL DIAGNOSES: 1. Compensated diastolic heart failure, which was exacerbated by     atrial fibrillation with rapid ventricular response and anemia. 2. 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 3. History of transient ischemic attack in 2012, degenerative joint     disease, restless legs syndrome, history of questionable peptic     ulcer disease/gastrointestinal therapeutic system in the past,     hypercholesteremia, status post urinary tract infection.  DISCHARGE HOME MEDICATIONS: 1. Eliquis 2.5 mg 1 tablet twice daily. 2. Atorvastatin 10 mg 1 tablet daily. 3. Ferrous sulfate 325 mg 1 tablet 3 times daily. 4. Tylenol 650 mg every 8 h. as needed. 5. Calcium  500 mg plus vitamin D twice daily as before. 6. Carvedilol CR 20 mg 1 daily. 7. Colace 100 mg 1 tablet daily as needed. 8. Nexium 40 mg 1 capsule daily. 9. Metformin 750 mg twice daily as before. 10.Lyrica 50 mg 1 tablet at bedtime. 11.Requip 2 mg 4 times daily as before. 12.Micardis 40 mg twice daily as before. 13.The patient has been advised to stop aspirin, pioglitazone,     amlodipine, and atorvastatin.  DIET:  Low salt, low cholesterol, 1800 calories ADA diet.  The patient has been advised to monitor blood sugar daily.  FOLLOWUP:  With me in 1 week.  CONDITION AT DISCHARGE:  Stable.  The patient has been advised to call 911 if she notices any black tarry stools.  She is dizzy, weak or any bleeding.  Condition at discharge is stable.  Follow up with GI in 2 weeks.  BRIEF HISTORY AND HOSPITAL COURSE:  Elizabeth Pugh is an 78 year old female with past medical history significant for multiple medical problems, i.e. hypertension, diabetes mellitus, history of CVA in 1998 with left paresis, history of TIA, GERD, degenerative joint disease, restless legs syndrome, chronic anemia, history of questionable peptic ulcer disease/GITS was admitted today because of progressive increasing shortness of breath associated with feeling weak since Thursday with minimal exertion, also complains of PND, orthopnea, leg swelling for last few days.  The patient states she has noticed dark tarry stools today, felt very weak so decided to come to the ER.  The patient presently denies any chest pain, palpitation, lightheadedness, but states has vague chest pain last few minutes on the left side without associated symptoms a few days ago.  In the ER, the patient was noted to be in AFib with controlled ventricular response and was noted to be significantly anemic with hemoglobin of 7.4.  The patient already was seen by GI and was scheduled for upper endoscopy in the next 48 hours.  PAST MEDICAL HISTORY:  As  above.  PAST SURGICAL HISTORY:  She had cataract surgery in the past, had abdominal hysterectomy in the past, had tubal ligation in the past.  PHYSICAL EXAMINATION:  GENERAL:  She was alert, awake, oriented x3, in no acute distress. VITAL SIGNS:  Blood pressure was 144/79, pulse was 88 irregularly irregular. EYES:  Conjunctivae were pale.  Sclerae nonicteric. NECK:  Supple.  Positive JVD.  No tracheal deviation.  No thyromegaly. CARDIOVASCULAR:  Irregularly irregular.  S1, S2 was normal.  There was soft systolic murmur and S3 gallop. LUNGS:  She had decreased breath sounds at bases with bibasilar faint rales. ABDOMEN:  Soft.  Bowel sounds were present.  Nontender. EXTREMITIES:  There is no clubbing, cyanosis.  There was 2+ edema.  LABORATORY DATA:  Her admission labs:  Sodium 141, potassium 4.3, BUN 31, creatinine 0.80, glucose 159.  Her cardiac enzymes 2 sets were normal.  Hemoglobin was 7.4, hematocrit 24.2.  ProBNP was 2019, repeat hemoglobin was 6.8, hematocrit 22.8, requiring 1 unit of packed RBCs. Hemoglobin on May 24, was 8.2, hematocrit 26.7, today hemoglobin is 9, hematocrit 30, white count was 3.9, proBNP is trending down to 911. Sodium is 134, potassium 4.6, BUN 22, creatinine 0.96.  BRIEF HOSPITAL COURSE:  The patient was admitted to the ICU. Subsequently, the patient remained in AFib, was started on IV Lasix with good diuresis.  The patient did drop her hemoglobin below 7 requiring 1 unit of packed RBCs.  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  The patient did not have any further episodes of GI bleeding.  Her hemoglobin is stable for last 2 to 3 days.  The patient will be started on apixaban as the patient is at very high risk of having recurrent embolic stroke as the patient's [place] score is 5.  The patient has been ambulating in room without any problems.  The patient will be followed up closely  as outpatient.  We will check her hemoglobin in 1 week as outpatient.  The patient has been advised to call 911 if she notices any bleeding or black tarry stool or feels dizzy or weak.     Eduardo Osier. Sharyn Lull, M.D.     MNH/MEDQ  D:  09/28/2011  T:  09/28/2011  Job:  [id number]

## 2011-11-17 ENCOUNTER — Ambulatory Visit: Payer: Self-pay | Admitting: Neurology

## 2011-12-13 ENCOUNTER — Inpatient Hospital Stay (HOSPITAL_COMMUNITY)
Admission: EM | Admit: 2011-12-13 | Discharge: 2011-12-15 | DRG: 100 | Disposition: A | Discharge status: Home Health | Payer: Medicare Other | Attending: Internal Medicine | Admitting: Internal Medicine

## 2011-12-13 ENCOUNTER — Emergency Department (HOSPITAL_COMMUNITY): Payer: Medicare Other

## 2011-12-13 ENCOUNTER — Encounter (HOSPITAL_COMMUNITY): Payer: Self-pay | Admitting: Emergency Medicine

## 2011-12-13 DIAGNOSIS — R4182 Altered mental status, unspecified: Secondary | ICD-10-CM

## 2011-12-13 DIAGNOSIS — IMO0002 Reserved for concepts with insufficient information to code with codable children: Secondary | ICD-10-CM

## 2011-12-13 DIAGNOSIS — G40909 Epilepsy, unspecified, not intractable, without status epilepticus: Principal | ICD-10-CM | POA: Diagnosis present

## 2011-12-13 DIAGNOSIS — F411 Generalized anxiety disorder: Secondary | ICD-10-CM | POA: Diagnosis present

## 2011-12-13 DIAGNOSIS — R410 Disorientation, unspecified: Secondary | ICD-10-CM

## 2011-12-13 DIAGNOSIS — I69359 Hemiplegia and hemiparesis following cerebral infarction affecting unspecified side: Secondary | ICD-10-CM

## 2011-12-13 DIAGNOSIS — I6529 Occlusion and stenosis of unspecified carotid artery: Secondary | ICD-10-CM | POA: Diagnosis present

## 2011-12-13 DIAGNOSIS — D509 Iron deficiency anemia, unspecified: Secondary | ICD-10-CM | POA: Diagnosis present

## 2011-12-13 DIAGNOSIS — IMO0001 Reserved for inherently not codable concepts without codable children: Secondary | ICD-10-CM | POA: Diagnosis present

## 2011-12-13 DIAGNOSIS — E1165 Type 2 diabetes mellitus with hyperglycemia: Secondary | ICD-10-CM | POA: Diagnosis present

## 2011-12-13 DIAGNOSIS — E118 Type 2 diabetes mellitus with unspecified complications: Secondary | ICD-10-CM

## 2011-12-13 DIAGNOSIS — I4891 Unspecified atrial fibrillation: Secondary | ICD-10-CM | POA: Diagnosis present

## 2011-12-13 DIAGNOSIS — I658 Occlusion and stenosis of other precerebral arteries: Secondary | ICD-10-CM | POA: Diagnosis present

## 2011-12-13 DIAGNOSIS — D649 Anemia, unspecified: Secondary | ICD-10-CM

## 2011-12-13 DIAGNOSIS — G459 Transient cerebral ischemic attack, unspecified: Secondary | ICD-10-CM

## 2011-12-13 DIAGNOSIS — F419 Anxiety disorder, unspecified: Secondary | ICD-10-CM

## 2011-12-13 DIAGNOSIS — Z79899 Other long term (current) drug therapy: Secondary | ICD-10-CM

## 2011-12-13 DIAGNOSIS — G2581 Restless legs syndrome: Secondary | ICD-10-CM

## 2011-12-13 DIAGNOSIS — I10 Essential (primary) hypertension: Secondary | ICD-10-CM

## 2011-12-13 DIAGNOSIS — Z87891 Personal history of nicotine dependence: Secondary | ICD-10-CM

## 2011-12-13 DIAGNOSIS — R739 Hyperglycemia, unspecified: Secondary | ICD-10-CM

## 2011-12-13 DIAGNOSIS — M129 Arthropathy, unspecified: Secondary | ICD-10-CM | POA: Diagnosis present

## 2011-12-13 DIAGNOSIS — K219 Gastro-esophageal reflux disease without esophagitis: Secondary | ICD-10-CM | POA: Diagnosis present

## 2011-12-13 DIAGNOSIS — G9340 Encephalopathy, unspecified: Secondary | ICD-10-CM | POA: Diagnosis present

## 2011-12-13 DIAGNOSIS — I69959 Hemiplegia and hemiparesis following unspecified cerebrovascular disease affecting unspecified side: Secondary | ICD-10-CM

## 2011-12-13 HISTORY — DX: Disorientation, unspecified: R41.0

## 2011-12-13 HISTORY — DX: Unspecified atrial fibrillation: I48.91

## 2011-12-13 LAB — CBC WITH DIFFERENTIAL/PLATELET
Basophils Absolute: 0 10*3/uL (ref 0.0–0.1)
Basophils Relative: 1 % (ref 0–1)
Eosinophils Relative: 1 % (ref 0–5)
Lymphocytes Relative: 36 % (ref 12–46)
MCV: 83.5 fL (ref 78.0–100.0)
Neutro Abs: 1.7 10*3/uL (ref 1.7–7.7)
Platelets: 157 10*3/uL (ref 150–400)
RDW: 20.2 % — ABNORMAL HIGH (ref 11.5–15.5)
WBC: 3.4 10*3/uL — ABNORMAL LOW (ref 4.0–10.5)

## 2011-12-13 LAB — GLUCOSE, CAPILLARY: Glucose-Capillary: 150 mg/dL — ABNORMAL HIGH (ref 70–99)

## 2011-12-13 LAB — POCT I-STAT, CHEM 8
Creatinine, Ser: 0.8 mg/dL (ref 0.50–1.10)
Glucose, Bld: 145 mg/dL — ABNORMAL HIGH (ref 70–99)
HCT: 35 % — ABNORMAL LOW (ref 36.0–46.0)
Hemoglobin: 11.9 g/dL — ABNORMAL LOW (ref 12.0–15.0)
Potassium: 4.5 mEq/L (ref 3.5–5.1)
Sodium: 140 mEq/L (ref 135–145)
TCO2: 21 mmol/L (ref 0–100)

## 2011-12-13 LAB — URINALYSIS, ROUTINE W REFLEX MICROSCOPIC
Glucose, UA: NEGATIVE mg/dL
Nitrite: NEGATIVE
Protein, ur: 30 mg/dL — AB
Urobilinogen, UA: 1 mg/dL (ref 0.0–1.0)

## 2011-12-13 LAB — RAPID URINE DRUG SCREEN, HOSP PERFORMED
Amphetamines: NOT DETECTED
Cocaine: NOT DETECTED
Opiates: NOT DETECTED
Tetrahydrocannabinol: NOT DETECTED

## 2011-12-13 LAB — BASIC METABOLIC PANEL
CO2: 26 mEq/L (ref 19–32)
Calcium: 9.6 mg/dL (ref 8.4–10.5)
GFR calc Af Amer: 74 mL/min — ABNORMAL LOW (ref >90)
GFR calc non Af Amer: 63 mL/min — ABNORMAL LOW (ref >90)
Sodium: 138 mEq/L (ref 135–145)

## 2011-12-13 LAB — URINE MICROSCOPIC-ADD ON

## 2011-12-13 MED ORDER — INSULIN ASPART 100 UNIT/ML ~~LOC~~ SOLN
0.0000 [IU] | Freq: Three times a day (TID) | SUBCUTANEOUS | Status: DC
  Administered 2011-12-14 – 2011-12-15 (×3): 1 [IU] via SUBCUTANEOUS

## 2011-12-13 MED ORDER — ROPINIROLE HCL 1 MG PO TABS
2.0000 mg | ORAL_TABLET | Freq: Four times a day (QID) | ORAL | Status: DC
  Administered 2011-12-13 – 2011-12-15 (×7): 2 mg via ORAL
  Filled 2011-12-13 (×10): qty 2

## 2011-12-13 MED ORDER — APIXABAN 2.5 MG PO TABS
2.5000 mg | ORAL_TABLET | Freq: Two times a day (BID) | ORAL | Status: DC
  Administered 2011-12-13 – 2011-12-15 (×4): 2.5 mg via ORAL
  Filled 2011-12-13 (×5): qty 1

## 2011-12-13 MED ORDER — SODIUM CHLORIDE 0.9 % IV SOLN
INTRAVENOUS | Status: AC
  Administered 2011-12-13: 20:00:00 via INTRAVENOUS

## 2011-12-13 MED ORDER — CARVEDILOL PHOSPHATE ER 20 MG PO CP24
20.0000 mg | ORAL_CAPSULE | Freq: Every day | ORAL | Status: DC
  Administered 2011-12-14 – 2011-12-15 (×2): 20 mg via ORAL
  Filled 2011-12-13 (×2): qty 1

## 2011-12-13 MED ORDER — SODIUM CHLORIDE 0.9 % IV SOLN: INTRAVENOUS | Status: DC

## 2011-12-13 MED ORDER — FERROUS SULFATE 325 (65 FE) MG PO TABS
325.0000 mg | ORAL_TABLET | Freq: Every day | ORAL | Status: DC
  Administered 2011-12-14 – 2011-12-15 (×2): 325 mg via ORAL
  Filled 2011-12-13 (×3): qty 1

## 2011-12-13 MED ORDER — IRBESARTAN 150 MG PO TABS
150.0000 mg | ORAL_TABLET | Freq: Two times a day (BID) | ORAL | Status: DC
  Administered 2011-12-13 – 2011-12-15 (×4): 150 mg via ORAL
  Filled 2011-12-13 (×6): qty 1

## 2011-12-13 MED ORDER — ACETAMINOPHEN 325 MG PO TABS
650.0000 mg | ORAL_TABLET | ORAL | Status: DC | PRN
  Administered 2011-12-14: 650 mg via ORAL
  Filled 2011-12-13: qty 2

## 2011-12-13 MED ORDER — FERROUS SULFATE 325 (65 FE) MG PO TABS
325.0000 mg | ORAL_TABLET | Freq: Three times a day (TID) | ORAL | Status: DC
  Administered 2011-12-14 – 2011-12-15 (×5): 325 mg via ORAL
  Filled 2011-12-13 (×7): qty 1

## 2011-12-13 MED ORDER — GLIMEPIRIDE 2 MG PO TABS
2.0000 mg | ORAL_TABLET | Freq: Every day | ORAL | Status: DC
  Administered 2011-12-14 – 2011-12-15 (×2): 2 mg via ORAL
  Filled 2011-12-13 (×5): qty 1

## 2011-12-13 MED ORDER — CALCIUM CARBONATE-VITAMIN D 500-200 MG-UNIT PO TABS
1.0000 | ORAL_TABLET | Freq: Two times a day (BID) | ORAL | Status: DC
  Administered 2011-12-13 – 2011-12-15 (×4): 1 via ORAL
  Filled 2011-12-13 (×6): qty 1

## 2011-12-13 MED ORDER — DOCUSATE SODIUM 100 MG PO CAPS: 100.0000 mg | ORAL_CAPSULE | Freq: Every day | ORAL | Status: DC | PRN

## 2011-12-13 MED ORDER — PANTOPRAZOLE SODIUM 40 MG PO TBEC
40.0000 mg | DELAYED_RELEASE_TABLET | Freq: Every day | ORAL | Status: DC
  Administered 2011-12-14 – 2011-12-15 (×2): 40 mg via ORAL
  Filled 2011-12-13 (×2): qty 1

## 2011-12-13 MED ORDER — PREGABALIN 50 MG PO CAPS
50.0000 mg | ORAL_CAPSULE | Freq: Every day | ORAL | Status: DC
  Administered 2011-12-13: 50 mg via ORAL
  Filled 2011-12-13: qty 1

## 2011-12-13 MED ORDER — CALCIUM CARBONATE-VITAMIN D 500-400 MG-UNIT PO TABS: 1.0000 | ORAL_TABLET | Freq: Two times a day (BID) | ORAL | Status: DC

## 2011-12-13 MED ORDER — ATORVASTATIN CALCIUM 10 MG PO TABS
10.0000 mg | ORAL_TABLET | Freq: Every day | ORAL | Status: DC
  Administered 2011-12-13 – 2011-12-15 (×3): 10 mg via ORAL
  Filled 2011-12-13 (×4): qty 1

## 2011-12-13 MED ORDER — ASPIRIN 81 MG PO CHEW
81.0000 mg | CHEWABLE_TABLET | Freq: Once | ORAL | Status: AC
  Administered 2011-12-13: 81 mg via ORAL
  Filled 2011-12-13: qty 1

## 2011-12-13 NOTE — Consult Note (Signed)
Referring Physician: Dr. Weldon Inches    Chief Complaint: Altered mental status  HPI: Elizabeth Pugh is an 78 y.o. female who was in her normal state up until 3 PM. At that time, she referred to chucks as "water bottles". She was still interactive, however, until she was in the car with her sister and niece. She was unusually quiet, around 3:30 her sister noticed that she was "slumped". Her niece was driving, but looked around and felt that her left face was drooped. Her sister is not sure but feels like the right side of her face was drooped as well. When she spoke, he was nonsensical gibberish, not formed words. She would mumble these. He was at this time, they decided to come to the emergency room. Since that time, her symptoms have completely resolved.  Of note, between Thanksgiving and Christmas of last year she had multiple episodes of brief unresponsiveness. She was initially treated for seizures which did not seem to improve things, and was evaluated by a neurologist who suspected that she was falling asleep intermittently according to the family. Her daughter was in the hospital during this time, and then improved and she seemed to have much fewer of these episodes.  Elizabeth Pugh is amnestic to the event that happened in a car. She also denies having "episodes".  LSN: 3 PM tPA Given: No: Rapidly improving symptoms  Past Medical History  Diagnosis Date  . Stroke   . GERD (gastroesophageal reflux disease)   . Hypertension   . Diabetes mellitus   . Blood transfusion   . Arthritis   . Anxiety   . Anemia   . GI (gastrointestinal bleed)     Due to benign mass  . Bronchitis   . Restless leg syndrome     Past Surgical History  Procedure Date  . Eye surgery     Cataract removal  . Abdominal hysterectomy   . Tubal ligation   . Eus 09/24/2011    Procedure: UPPER ENDOSCOPIC ULTRASOUND (EUS) LINEAR;  Surgeon: Theda Belfast, MD;  Location: [facility];  Service: Endoscopy;  Laterality:  N/A;    Family History  Problem Relation Age of Onset  . Aneurysm Mother   . Heart attack Father   . Heart failure Sister   . Asthma Sister   . Kidney failure Sister   . Stroke Sister    Social History:  reports that she quit smoking about 26 years ago. Her smoking use included Cigarettes. She has never used smokeless tobacco. She reports that she does not drink alcohol or use illicit drugs.  Allergies:  Allergies  Allergen Reactions  . Erythromycin Other (See Comments)    Reaction=vgina itches  . Penicillins Rash  . Zithromax (Azithromycin) Other (See Comments)    Pt states that it gives her a yeast infection.    Medications:  Per chart apixiban Atorvastatin 10 mg   carvedilol 20 mg daily Docusate Nexium Iron 325 Metformin Pregabalin 50 mg each bedtime Requip 2 mg 4 times a day Micardis 40 mg twice a day   ROS:  12 point review of systems was negative except as noted in the history of present illness  Physical Examination: Blood pressure 165/94, pulse 88, temperature 97.8 F (36.6 C), temperature source Oral, resp. rate 18, height 5\' 7"  (1.702 m), weight 80.287 kg (177 lb), SpO2 100.00%.  Neurologic Examination:  Gen. awake, alert, interactive and appropriate. CV: Irregular Mental Status: Awake, alert, oriented to person place and year, got month  wrong "July". Cranial Nerves: II-visual fields intact II/IV/VI-pupils equal round and reactive to light, extraocular movements intact V/VII-face symmetric to light touch and sensation VIII-hearing intact to voice IX/X-uvula elevates symmetrically XI-shoulder shrug symmetric XII-tongue midline Motor: 5 out of 5 strength throughout, normal bulk and strength Sensory: Intact to light touch DTR's: 2+ and symmetric at the biceps and patella Cerebellar: Intact finger-nose-finger Gait: Not assessed as patient was being urgently evaluated for possible stroke   Laboratory Studies:  Basic Metabolic Panel:  Lab  12/13/11 1602 12/13/11 1546  NA 140 138  K 4.5 4.4  CL 106 104  CO2 -- 26  GLUCOSE 145* 152*  BUN 24* 22  CREATININE 0.80 0.83  CALCIUM -- 9.6  MG -- --  PHOS -- --    Liver Function Tests: No results found for this basename: AST:5,ALT:5,ALKPHOS:5,BILITOT:5,PROT:5,ALBUMIN:5 in the last 168 hours No results found for this basename: LIPASE:5,AMYLASE:5 in the last 168 hours No results found for this basename: AMMONIA:3 in the last 168 hours  CBC:  Lab 12/13/11 1602 12/13/11 1546  WBC -- 3.4*  NEUTROABS -- 1.7  HGB 11.9* 10.0*  HCT 35.0* 33.0*  MCV -- 83.5  PLT -- 157    Cardiac Enzymes: No results found for this basename: CKTOTAL:5,CKMB:5,CKMBINDEX:5,TROPONINI:5 in the last 168 hours  BNP: No components found with this basename: POCBNP:5  CBG:  Lab 12/13/11 1543  GLUCAP 150*    Microbiology: Results for orders placed during the hospital encounter of 09/22/11  MRSA PCR SCREENING     Status: Normal   Collection Time   09/22/11  9:57 PM      Component Value Range Status Comment   MRSA by PCR NEGATIVE  NEGATIVE Final   URINE CULTURE     Status: Normal   Collection Time   09/23/11  6:45 PM      Component Value Range Status Comment   Specimen Description URINE, CLEAN CATCH   Final    Special Requests NONE   Final    Culture  Setup Time [id number]   Final    Colony Count NO GROWTH   Final    Culture NO GROWTH   Final    Report Status 09/24/2011 FINAL   Final     Coagulation Studies: No results found for this basename: LABPROT:5,INR:5 in the last 72 hours  Urinalysis:  Lab 12/13/11 1656  COLORURINE AMBER*  LABSPEC 1.029  PHURINE 5.0  GLUCOSEU NEGATIVE  HGBUR TRACE*  BILIRUBINUR SMALL*  KETONESUR 15*  PROTEINUR 30*  UROBILINOGEN 1.0  NITRITE NEGATIVE  LEUKOCYTESUR TRACE*    Lipid Panel:    Component Value Date/Time   CHOL 103 09/23/2011 0540   TRIG 70 09/23/2011 0540   HDL 46 09/23/2011 0540   CHOLHDL 2.2 09/23/2011 0540   VLDL 14 09/23/2011 0540    LDLCALC 43 09/23/2011 0540    HgbA1C:  Lab Results  Component Value Date   HGBA1C 7.4* 09/22/2011    Urine Drug Screen:   No results found for this basename: labopia, cocainscrnur, labbenz, amphetmu, thcu, labbarb    Alcohol Level: No results found for this basename: ETH:2 in the last 168 hours .  Imaging: Ct Head Wo Contrast  12/13/2011  *RADIOLOGY REPORT*  Clinical Data: Left facial droop.  Slurred speech.  Syncope. Possible seizure  CT HEAD WITHOUT CONTRAST  Technique:  Contiguous axial images were obtained from the base of the skull through the vertex without contrast.  Comparison: 04/13/2011 MR.  03/25/2011 CT.  Findings: Motion degraded  exam.  No intracranial hemorrhage.  Remote infarcts within the thalami and lenticular nucleus more notable on the left.  Remote right paracentral pontine infarct.  Tiny hypodensity posterior right frontal lobe (series 2 image 21) not seen on prior exam.  Small acute infarct not entirely excluded. No CT evidence of large acute infarct.  No intracranial mass lesion detected on this unenhanced exam.  Global atrophy without hydrocephalus.  Vascular calcifications.  IMPRESSION: Remote infarcts and small vessel disease type changes without CT evidence of large acute infarct or intracranial hemorrhage.  Small acute infarct of the posterior right frontal lobe not excluded (series 2 image 21) although this may be related to result of small vessel disease.  Global atrophy without hydrocephalus  Results discussed with Dr. Amada Jupiter A 03/23/2012 4:22 p.m.  Original Report Authenticated By: Fuller Canada, M.D.    Assessment: 78 y.o. female  with transient altered mental status to which she is amnestic. I am concerned that it may have been a small seizure. Also in the differential, however, is TIA which will also need to be worked up. Also, given the history of episodes associated with periods of intense stress, a conversion reaction cannot be completely ruled out, but  this would only be entertained if the other 2 possibilities are ruled out.  Stroke Risk Factors - atrial fibrillation, diabetes mellitus, hyperlipidemia and hypertension  Plan: 1. HgbA1c, fasting lipid panel 2. MRI, MRA  of the brain without contrast 3. PT consult, OT consult, Speech consult 4. Echocardiogram 5. Carotid dopplers 6. Prophylactic therapy-Antiplatelet med: Aspirin - dose 81 7. Risk factor modification 8. Telemetry monitoring 9. EEG tomorrow  Ritta Slot, MD [facility] [phone]  12/13/2011, 5:50 PM

## 2011-12-13 NOTE — H&P (Addendum)
Patient's PCP: Dorrene German, MD Patient's cardiologist: Dr. Concepcion Elk Patient's neurologist: Dr. Modesto Charon  Chief Complaint: Altered mental status  History of Present Illness: Elizabeth Pugh is a 78 y.o. African American female with history of hypertension, A. fib, restless leg syndrome, diabetes, anxiety, anemia, history of GI bleed due to benign mass and arthritis who presents with the above complaints.  Patient was visiting her daughter at [facility] today and after she left the hospital at around 245 p.m. she was noted to be incoherent, and confused.  Most of the history was provided by daughter and niece, niece witnessed the event.  Patient was babbling at times, she had confused the blue chucks as water bottles.  Niece also thought that the left side of the face may have been more droopy, worse from her baseline deficits on the left from previous stroke.  Approximately at 315 p.m. she may have lost consciousness or may have "blacked out."  As a result she was brought to the emergency department for further evaluation.  Upon presentation to the emergency department at 330 p.m. her symptoms improved and she was at her baseline per family during my examination of the patient.  Denies any recent fevers, chills, nausea, vomiting, chest pain or shortness of breath.  Has had some headaches at times.  Denies any vision change.  Patient has a left-sided weakness, more prominent in the left lower extremity, and left face deficits from previous stroke.  Patient in the past has had episodes of unresponsiveness, had been treated for seizures, her neurologist had stopped her seizure medications.  Past Medical History  Diagnosis Date  . Stroke   . GERD (gastroesophageal reflux disease)   . Hypertension   . Diabetes mellitus   . Blood transfusion   . Arthritis   . Anxiety   . Anemia   . GI (gastrointestinal bleed)     Due to benign mass  . Bronchitis   . Restless leg syndrome   . Atrial  fibrillation    Past Surgical History  Procedure Date  . Eye surgery     Cataract removal  . Abdominal hysterectomy   . Tubal ligation   . Eus 09/24/2011    Procedure: UPPER ENDOSCOPIC ULTRASOUND (EUS) LINEAR;  Surgeon: Theda Belfast, MD;  Location: [facility];  Service: Endoscopy;  Laterality: N/A;   Family History  Problem Relation Age of Onset  . Aneurysm Mother   . Heart attack Father   . Heart failure Sister   . Asthma Sister   . Kidney failure Sister   . Stroke Sister    History   Social History  . Marital Status: Widowed    Spouse Name: N/A    Number of Children: N/A  . Years of Education: N/A   Occupational History  . Not on file.   Social History Main Topics  . Smoking status: Former Smoker    Types: Cigarettes    Quit date: 09/21/1985  . Smokeless tobacco: Never Used  . Alcohol Use: No  . Drug Use: No  . Sexually Active: No   Other Topics Concern  . Not on file   Social History Narrative  . No narrative on file   Allergies: Erythromycin; Penicillins; and Zithromax  Meds: Scheduled Meds:   . sodium chloride   Intravenous STAT   Continuous Infusions:   . sodium chloride     PRN Meds:.  Review of Systems: All systems reviewed with the patient and positive as per history  of present illness, otherwise all other systems are negative.  Physical Exam: Blood pressure 138/69, pulse 85, temperature 98.1 F (36.7 C), temperature source Oral, resp. rate 16, height 5\' 7"  (1.702 m), weight 80.287 kg (177 lb), SpO2 100.00%. General: Awake, Oriented x3, No acute distress. HEENT: EOMI, Moist mucous membranes Neck: Supple CV: S1 and S2 Lungs: Clear to ascultation bilaterally Abdomen: Soft, Nontender, Nondistended, +bowel sounds. Ext: Good pulses. Trace edema. No clubbing or cyanosis noted. Neuro: Cranial Nerves II-XII grossly intact. Has 5/5 motor strength in upper extremities bilaterally, 4/5 motor strength in left lower extremity, 5/5 motor strength  in right lower extremity..  Lab results:  Basename 12/13/11 1602 12/13/11 1546  NA 140 138  K 4.5 4.4  CL 106 104  CO2 -- 26  GLUCOSE 145* 152*  BUN 24* 22  CREATININE 0.80 0.83  CALCIUM -- 9.6  MG -- --  PHOS -- --   No results found for this basename: AST:2,ALT:2,ALKPHOS:2,BILITOT:2,PROT:2,ALBUMIN:2 in the last 72 hours No results found for this basename: LIPASE:2,AMYLASE:2 in the last 72 hours  Basename 12/13/11 1602 12/13/11 1546  WBC -- 3.4*  NEUTROABS -- 1.7  HGB 11.9* 10.0*  HCT 35.0* 33.0*  MCV -- 83.5  PLT -- 157   No results found for this basename: CKTOTAL:3,CKMB:3,CKMBINDEX:3,TROPONINI:3 in the last 72 hours No components found with this basename: POCBNP:3 No results found for this basename: DDIMER in the last 72 hours No results found for this basename: HGBA1C:2 in the last 72 hours No results found for this basename: CHOL:2,HDL:2,LDLCALC:2,TRIG:2,CHOLHDL:2,LDLDIRECT:2 in the last 72 hours No results found for this basename: TSH,T4TOTAL,FREET3,T3FREE,THYROIDAB in the last 72 hours No results found for this basename: VITAMINB12:2,FOLATE:2,FERRITIN:2,TIBC:2,IRON:2,RETICCTPCT:2 in the last 72 hours Imaging results:  Ct Head Wo Contrast  12/13/2011  *RADIOLOGY REPORT*  Clinical Data: Left facial droop.  Slurred speech.  Syncope. Possible seizure  CT HEAD WITHOUT CONTRAST  Technique:  Contiguous axial images were obtained from the base of the skull through the vertex without contrast.  Comparison: 04/13/2011 MR.  03/25/2011 CT.  Findings: Motion degraded exam.  No intracranial hemorrhage.  Remote infarcts within the thalami and lenticular nucleus more notable on the left.  Remote right paracentral pontine infarct.  Tiny hypodensity posterior right frontal lobe (series 2 image 21) not seen on prior exam.  Small acute infarct not entirely excluded. No CT evidence of large acute infarct.  No intracranial mass lesion detected on this unenhanced exam.  Global atrophy without  hydrocephalus.  Vascular calcifications.  IMPRESSION: Remote infarcts and small vessel disease type changes without CT evidence of large acute infarct or intracranial hemorrhage.  Small acute infarct of the posterior right frontal lobe not excluded (series 2 image 21) although this may be related to result of small vessel disease.  Global atrophy without hydrocephalus  Results discussed with Dr. Amada Jupiter A 03/23/2012 4:22 p.m.  Original Report Authenticated By: Fuller Canada, M.D.   Other results: EKG: atrial fibrillation, rate 79, unchanged from previous EKG..  Assessment & Plan by Problem: Altered mental status/altered mental status/encephalopathy Likely due to TIA versus seizure.  Head CT done, showed possible small acute infarct of the posterior right frontal lobe, not excluded on CT.  Will initiate TIA workup including MRI/MRA of the brain, carotid Dopplers, PT and OT consultation.  Given history of strokes, will do EEG to evaluate for seizures. Discussed with Dr. Amada Jupiter, will continue Apixaban, and give one dose of aspirin 81 mg. Continue pregabalin.  Will do a swallow evaluation and  start her on diet if she passes.  Patient had a 2-D echocardiogram on 09/23/2011 which showed cavity size was normal, wall thickness was increased in a pattern of mild LVH, systolic function was normal, ejection fraction 55-60%, will not repeat echocardiogram as patient is already anticoagulated on Aprixaban.  Atrial fibrillation Rate controlled.  Continue Aprixaban, for anticoagulation. Management per Dr. Sharyn Lull, as outpatient.  Hypertension Continue home antihypertensive medications.  Continue carvedilol, and telmisartan (irbesartan substituted).  Type 2 diabetes uncontrolled with complications Hold metformin.  Continue glyburide.  Sensitive sliding scale insulin.  Anemia Hemoglobin stable.  Due to iron deficiency anemia, serum iron on May 2013 was less than 10.  Continue supplemental  iron.  History of CVA with left-sided deficits/history of TIA in 2012 Stable.  As above.  Restless leg syndrome Continue Ropinirole.  Hypercholesterolemia Continue statin.  Prophylaxis Apixaban.  CODE STATUS Full code.  She does not wish to be on life support long-term, is okay with being on life support temporarily.  Discussed with patient and family at the time of admission.  Time spent on admission, talking to the patient, and coordinating care was: 60 mins.  Miriam Kestler A, MD 12/13/2011, 6:16 PM

## 2011-12-13 NOTE — ED Notes (Signed)
Patient transported to CT 

## 2011-12-13 NOTE — ED Notes (Signed)
Patient eating dinner at this time.

## 2011-12-13 NOTE — ED Notes (Signed)
CBG was 150

## 2011-12-13 NOTE — ED Provider Notes (Signed)
History     CSN: [id number]  Arrival date & time 12/13/11  1534   First MD Initiated Contact with Patient 12/13/11 1544      Chief Complaint  Patient presents with  . Aphasia    (Consider location/radiation/quality/duration/timing/severity/associated sxs/prior treatment) The history is provided by the patient and a relative. The history is limited by the condition of the patient.   she has a history of prior stroke, hypertension, and diabetes.  She was visiting her daughter in the hospital today.  Her niece date that she had some mumbling speech, which at times, was incoherent.  When they were leaving and the patient was in the car.  She became diaphoretic.  The left side of her face was drooping and she apparently clutched her chest and had a brief episode of syncope.  The patient says I fell out.  The patient denies nausea, vomiting vision changes.  She denies fluttering of her heart.  She had no pain anywhere and continues to have no pain.  She states that she is asymptomatic at this time.  She denies recent illness.  Level V caveat applies for urgent need for evaluation for possible code stroke or TIA  Past Medical History  Diagnosis Date  . Stroke   . GERD (gastroesophageal reflux disease)   . Hypertension   . Diabetes mellitus   . Blood transfusion   . Arthritis   . Anxiety   . Anemia   . GI (gastrointestinal bleed)     Due to benign mass  . Bronchitis   . Restless leg syndrome     Past Surgical History  Procedure Date  . Eye surgery     Cataract removal  . Abdominal hysterectomy   . Tubal ligation   . Eus 09/24/2011    Procedure: UPPER ENDOSCOPIC ULTRASOUND (EUS) LINEAR;  Surgeon: Theda Belfast, MD;  Location: [facility];  Service: Endoscopy;  Laterality: N/A;    Family History  Problem Relation Age of Onset  . Aneurysm Mother   . Heart attack Father   . Heart failure Sister   . Asthma Sister   . Kidney failure Sister   . Stroke Sister     History    Substance Use Topics  . Smoking status: Former Smoker    Types: Cigarettes    Quit date: 09/21/1985  . Smokeless tobacco: Never Used  . Alcohol Use: No    OB History    Grav Para Term Preterm Abortions TAB SAB Ect Mult Living                  Review of Systems  Constitutional: Negative for fever and chills.  HENT: Negative for neck pain and neck stiffness.   Eyes: Negative for visual disturbance.  Respiratory: Negative for cough, chest tightness and shortness of breath.   Cardiovascular: Negative for chest pain, palpitations and leg swelling.  Gastrointestinal: Negative for nausea, vomiting and abdominal pain.  Genitourinary: Negative for dysuria and hematuria.  Musculoskeletal: Negative for back pain.  Skin: Negative for rash.  Neurological: Positive for syncope. Negative for seizures and headaches.  Hematological: Does not bruise/bleed easily.  Psychiatric/Behavioral: Negative for confusion.  All other systems reviewed and are negative.    Allergies  Erythromycin; Penicillins; and Zithromax  Home Medications   Current Outpatient Rx  Name Route Sig Dispense Refill  . ACETAMINOPHEN ER 650 MG PO TBCR Oral Take 650 mg by mouth every 8 (eight) hours as needed. For pain     .  APIXABAN 2.5 MG PO TABS Oral Take 1 tablet (2.5 mg total) by mouth 2 (two) times daily. 60 tablet 3  . ATORVASTATIN CALCIUM 10 MG PO TABS Oral Take 1 tablet (10 mg total) by mouth daily. 30 tablet 3  . CALCIUM CARBONATE-VITAMIN D 500-400 MG-UNIT PO TABS Oral Take 1 tablet by mouth 2 (two) times daily.    Marland Kitchen CARVEDILOL PHOSPHATE ER 20 MG PO CP24 Oral Take 20 mg by mouth daily.      Marland Kitchen DOCUSATE SODIUM 100 MG PO CAPS Oral Take 100 mg by mouth daily as needed.     Marland Kitchen ESOMEPRAZOLE MAGNESIUM 40 MG PO CPDR Oral Take 40 mg by mouth daily before breakfast.      . FERROUS SULFATE 325 (65 FE) MG PO TABS Oral Take 1 tablet (325 mg total) by mouth 3 (three) times daily with meals. 90 tablet 1  . METFORMIN HCL ER 750  MG PO TB24 Oral Take 750 mg by mouth 2 (two) times daily.     Marland Kitchen PREGABALIN 50 MG PO CAPS Oral Take 50 mg by mouth at bedtime.      Marland Kitchen ROPINIROLE HCL 2 MG PO TABS Oral Take 2 mg by mouth 4 (four) times daily.     . TELMISARTAN 40 MG PO TABS Oral Take 40 mg by mouth 2 (two) times daily.      BP 165/94  Pulse 88  Temp 97.8 F (36.6 C) (Oral)  Resp 18  Ht 5\' 7"  (1.702 m)  Wt 177 lb (80.287 kg)  BMI 27.72 kg/m2  SpO2 100%  Physical Exam  Nursing note and vitals reviewed. Constitutional: She is oriented to person, place, and time. She appears well-developed and well-nourished. No distress.  HENT:  Head: Normocephalic and atraumatic.  Eyes: Conjunctivae and EOM are normal. Pupils are equal, round, and reactive to light.  Neck: Normal range of motion. Neck supple. No JVD present.       No bruits  Cardiovascular: Normal rate and intact distal pulses.   No murmur heard.      Irregular heartbeat  Pulmonary/Chest: Effort normal and breath sounds normal. No respiratory distress. She has no wheezes. She has no rales.  Abdominal: Soft. Bowel sounds are normal. She exhibits no distension. There is no tenderness.  Musculoskeletal: Normal range of motion. She exhibits no edema and no tenderness.  Neurological: She is alert and oriented to person, place, and time. She has normal strength. She displays no tremor. No cranial nerve deficit or sensory deficit. She exhibits normal muscle tone. Coordination normal. GCS eye subscore is 4. GCS verbal subscore is 5. GCS motor subscore is 6.       She has a symmetric smile.  Normal.  Strength in all 4 extremities, and normal light touch sensation in all extremities  Skin: Skin is warm and dry.  Psychiatric: She has a normal mood and affect. Thought content normal.    ED Course  Procedures (including critical care time) 78 year old, female, with history of stroke, and diabetes, and hypertension, presents to emergency department with a brief episode of left  facial droop, slurred speech, and brief syncope.  She is asymptomatic now with a normal physical examination and neurological.  Examination.  We will evaluate her for TIA versus metabolic etiology for her symptoms  Labs Reviewed  GLUCOSE, CAPILLARY - Abnormal; Notable for the following:    Glucose-Capillary 150 (*)     All other components within normal limits  CBC WITH DIFFERENTIAL  BASIC METABOLIC  PANEL  URINALYSIS, ROUTINE W REFLEX MICROSCOPIC   No results found.   No diagnosis found.  ECG. Atrial fibrillation at 79 beats per minute. Normal axis. Normal intervals. Normal.  ST and T waves  Neurologist evaluated pt and agreed with cancellation of code stroke.  He recs admission for tia vs sz  5:24 PM Spoke with Dr. Betti Cruz.  He will admit for further eval and tx  MDM  tia vs sz Normal.  Mental status and neurological.  Examination.  Now.  CAT scan does not show signs of an acute stroke.        Cheri Guppy, MD 12/13/11 1725

## 2011-12-13 NOTE — ED Notes (Signed)
Patient transferred to 4 north via stretcher report called to [employer]

## 2011-12-13 NOTE — ED Notes (Signed)
Patient brought in via POV with niece. Patient riding in the car and her niece advises that she started mumbling when she tried to speak, and the left side of her face drooped. Skin got clammy, heart was racing and patient was clutching her chest. Patient did black out just a second.

## 2011-12-13 NOTE — ED Notes (Signed)
Attempted an IV times two without success IV team noified

## 2011-12-13 NOTE — ED Notes (Signed)
Back from xray

## 2011-12-13 NOTE — Code Documentation (Signed)
Code Stroke called at 1540 Patient arrival at 95 EDP exam 1540 Stroke team arrival 1549 LSN 1445 Pt arrival in CT 1605 Phlebotomist arrival 1543 CT read 1608  Pt with difficulty word finding at 1450,  Then during drive home pt speech became slurred and had a Left facial droop per family.  Upon arrival to ED pt with clear speech and language and symmetrical face.  NIHSS 3, drift bilat leg and unable to state month.  CT head done.  Dr Anise Salvo at bedside.  Per patient questionable hx of seizure, and AF.

## 2011-12-14 ENCOUNTER — Observation Stay (HOSPITAL_COMMUNITY): Payer: Medicare Other

## 2011-12-14 DIAGNOSIS — G459 Transient cerebral ischemic attack, unspecified: Secondary | ICD-10-CM

## 2011-12-14 LAB — LIPID PANEL: HDL: 47 mg/dL (ref >39)

## 2011-12-14 LAB — HEMOGLOBIN A1C
Hgb A1c MFr Bld: 6.4 % — ABNORMAL HIGH (ref <5.7)
Mean Plasma Glucose: 137 mg/dL — ABNORMAL HIGH (ref <117)

## 2011-12-14 LAB — GLUCOSE, CAPILLARY: Glucose-Capillary: 136 mg/dL — ABNORMAL HIGH (ref 70–99)

## 2011-12-14 MED ORDER — PREGABALIN 75 MG PO CAPS: 75.0000 mg | ORAL_CAPSULE | Freq: Two times a day (BID) | ORAL | Status: DC

## 2011-12-14 MED ORDER — PREGABALIN 75 MG PO CAPS
75.0000 mg | ORAL_CAPSULE | Freq: Two times a day (BID) | ORAL | Status: DC
  Administered 2011-12-14 – 2011-12-15 (×2): 75 mg via ORAL
  Filled 2011-12-14 (×2): qty 1

## 2011-12-14 NOTE — Progress Notes (Signed)
Utilization review complete 

## 2011-12-14 NOTE — Evaluation (Signed)
Physical Therapy Evaluation Patient Details Name: Elizabeth Pugh MRN: [id number] DOB: [birth year] Today's Date: 12/14/2011 Time: 4098-1191 PT Time Calculation (min): 33 min  PT Assessment / Plan / Recommendation Clinical Impression  Pt was adm s/p episode of decreased speech and with "blacking out." She reports her RLE has been progressively weakening over the last several months (to the point she needed assist up/down her steps to enter home.Pt reports current weakness is no worse than it has been recently. Pt inquiring re: gettting more help at home when daughter goes to have surgery. Discussed option of post-acute rehab and she is open to this. Anticipate SNF will be most appropriate venue.    PT Assessment  Patient needs continued PT services    Follow Up Recommendations  Skilled nursing facility;Supervision/Assistance - 24 hour    Barriers to Discharge Decreased caregiver support      Equipment Recommendations  None recommended by PT    Recommendations for Other Services     Frequency Min 3X/week    Precautions / Restrictions Precautions Precautions: Fall   Pertinent Vitals/Pain Rt Knee-chronic      Mobility  Bed Mobility Bed Mobility: Supine to Sit;Sitting - Scoot to Edge of Bed Supine to Sit: 4: Min guard;With rails;HOB elevated Sitting - Scoot to Edge of Bed: 4: Min assist;With rail Details for Bed Mobility Assistance: assist to scoot to EOB with pad (pt initiating movement, just could not overcome friction to slide to EOB) Transfers Transfers: Sit to Stand;Stand to Sit Sit to Stand: 4: Min assist;With upper extremity assist;From bed Stand to Sit: 4: Min assist;With upper extremity assist;With armrests;To chair/3-in-1 Details for Transfer Assistance: vc for safe use of RW (pt trying to pull up on RW); assist to extend hips and knees due to weakness Ambulation/Gait Ambulation/Gait Assistance: 4: Min assist Ambulation Distance (Feet): 20 Feet Assistive device:  Rolling walker Ambulation/Gait Assistance Details: Slow cadence; shortened step length; as began to fatigue her Rt knee became tremulous Gait Pattern: Step-through pattern;Decreased stride length;Right foot flat;Left foot flat;Shuffle Modified Rankin (Stroke Patients Only) Pre-Morbid Rankin Score: Moderate disability Modified Rankin: Moderate disability    Exercises     PT Diagnosis: Difficulty walking;Generalized weakness  PT Problem List: Decreased strength;Decreased activity tolerance;Decreased balance;Decreased mobility;Decreased knowledge of use of DME PT Treatment Interventions: DME instruction;Gait training;Functional mobility training;Therapeutic activities;Therapeutic exercise;Patient/family education   PT Goals Acute Rehab PT Goals PT Goal Formulation: With patient Time For Goal Achievement: 12/28/11 Potential to Achieve Goals: Good Pt will go Supine/Side to Sit: with modified independence;with HOB 0 degrees PT Goal: Supine/Side to Sit - Progress: Goal set today Pt will go Sit to Supine/Side: with modified independence;with HOB 0 degrees PT Goal: Sit to Supine/Side - Progress: Goal set today Pt will go Sit to Stand: with supervision;with upper extremity assist PT Goal: Sit to Stand - Progress: Goal set today Pt will Ambulate: 51 - 150 feet;with supervision;with least restrictive assistive device PT Goal: Ambulate - Progress: Goal set today Pt will Perform Home Exercise Program: with supervision, verbal cues required/provided PT Goal: Perform Home Exercise Program - Progress: Goal set today  Visit Information  Last PT Received On: 12/14/11 Assistance Needed: +1    Subjective Data  Subjective: "I'm going to need some more help at home. My daughter is going to have 2 knee surgeries and a back surgery." Patient Stated Goal: incr strength RLE   Prior Functioning  Home Living Lives With: Daughter Available Help at Discharge: Other (Comment) (unknown; daughter to have  surgery)  Type of Home: House Home Access: Stairs to enter [employer] of Steps: 5 Entrance Stairs-Rails: Right;Left Home Layout: One level Bathroom Shower/Tub: Other (comment);Tub/shower unit (sponge bath) Bathroom Toilet: Handicapped height Bathroom Accessibility: Yes How Accessible: Accessible via walker Home Adaptive Equipment: Walker - rolling;Walker - four wheeled;Tub transfer bench;Grab bars in shower;Bedside commode/3-in-1 Prior Function Level of Independence: Needs assistance Needs Assistance: Meal Prep;Light Housekeeping (stairs) Able to Take Stairs?: Yes Driving: No Communication Communication: No difficulties Dominant Hand: Right    Cognition  Overall Cognitive Status: Impaired Area of Impairment: Other (comment) (occasional comment off topic) Arousal/Alertness: Awake/alert Orientation Level: Appears intact for tasks assessed Behavior During Session: [facility] for tasks performed    Extremity/Trunk Assessment Right Lower Extremity Assessment RLE ROM/Strength/Tone: Deficits RLE ROM/Strength/Tone Deficits: Knee extension 4/5; DF 5/5 RLE Coordination: WFL - gross motor Left Lower Extremity Assessment LLE ROM/Strength/Tone: Deficits LLE ROM/Strength/Tone Deficits: previous CVA; knee extension 3+/5, DF 4/5 LLE Coordination: WFL - gross motor Trunk Assessment Trunk Assessment: Kyphotic   Balance    End of Session PT - End of Session Equipment Utilized During Treatment: Gait belt Activity Tolerance: Patient limited by fatigue Patient left: in chair;with call bell/phone within reach  GP     Elizabeth Pugh 12/14/2011, 3:15 PM  Pager [phone]

## 2011-12-14 NOTE — Progress Notes (Addendum)
Subjective:  No complaints. She is aware of where she is and able to hold a normal conversation.  Objective: Current vital signs: BP 174/87  Pulse 81  Temp 97.6 F (36.4 C) (Oral)  Resp 18  Ht 5\' 7"  (1.702 m)  Wt 85.8 kg (189 lb 2.5 oz)  BMI 29.63 kg/m2  SpO2 100% Vital signs in last 24 hours: Temp:  [97.6 F (36.4 C)-98.1 F (36.7 C)] 97.6 F (36.4 C) (08/12 0600) Pulse Rate:  [69-95] 81  (08/12 0640) Resp:  [16-20] 18  (08/12 0640) BP: (138-182)/(63-94) 174/87 mmHg (08/12 0640) SpO2:  [100 %] 100 % (08/12 0600) Weight:  [80.287 kg (177 lb)-90 kg (198 lb 6.6 oz)] 85.8 kg (189 lb 2.5 oz) (08/11 1944)  Intake/Output from previous day: 08/11 0701 - 08/12 0700 In: 1265 [P.O.:440; I.V.:825] Out: 1150 [Urine:1150] Intake/Output this shift: Total I/O In: 240 [P.O.:240] Out: 360 [Urine:360] Nutritional status: Carb Control  Neurologic Exam: Mental Status: Alert, interactive, follows commands.  Cranial Nerves: II-Visual fields grossly intact. III/IV/VI-Extraocular movements intact.  Pupils reactive bilaterally. Ptosis  Present left eye. V/VII-Smile symmetric VIII-grossly intact XI-bilateral shoulder shrug XII-midline tongue extension Motor: 5/5 UE bilaterally and 4/5 on left EL(chronic from previous stroke)  Sensory: Pinprick and light touch intact throughout, bilaterally Deep Tendon Reflexes:  Right: Upper Extremity   Left: Upper extremity   biceps (C-5 to C-6) 2/4   biceps (C-5 to C-6) 2/4 tricep (C7) 2/4    triceps (C7) 2/4 Brachioradialis (C6) 2/4  Brachioradialis (C6) 2/4  Lower Extremity Lower Extremity  quadriceps (L-2 to L-4) 2/4   quadriceps (L-2 to L-4) 2/4  Cerebellar: Normal finger-to-nose,   Lab Results: Basic Metabolic Panel:  Lab 12/13/11 1610 12/13/11 1546  NA 140 138  K 4.5 4.4  CL 106 104  CO2 -- 26  GLUCOSE 145* 152*  BUN 24* 22  CREATININE 0.80 0.83  CALCIUM -- 9.6  MG -- --  PHOS -- --    Liver Function Tests: No results found  for this basename: AST:5,ALT:5,ALKPHOS:5,BILITOT:5,PROT:5,ALBUMIN:5 in the last 168 hours No results found for this basename: LIPASE:5,AMYLASE:5 in the last 168 hours No results found for this basename: AMMONIA:3 in the last 168 hours  CBC:  Lab 12/13/11 1602 12/13/11 1546  WBC -- 3.4*  NEUTROABS -- 1.7  HGB 11.9* 10.0*  HCT 35.0* 33.0*  MCV -- 83.5  PLT -- 157    Cardiac Enzymes: No results found for this basename: CKTOTAL:5,CKMB:5,CKMBINDEX:5,TROPONINI:5 in the last 168 hours  Lipid Panel:  Lab 12/14/11 0445  CHOL 105  TRIG 75  HDL 47  CHOLHDL 2.2  VLDL 15  LDLCALC 43    CBG:  Lab 12/14/11 0642 12/13/11 2112 12/13/11 1543  GLUCAP 132* 158* 150*    Microbiology: Results for orders placed during the hospital encounter of 09/22/11  MRSA PCR SCREENING     Status: Normal   Collection Time   09/22/11  9:57 PM      Component Value Range Status Comment   MRSA by PCR NEGATIVE  NEGATIVE Final   URINE CULTURE     Status: Normal   Collection Time   09/23/11  6:45 PM      Component Value Range Status Comment   Specimen Description URINE, CLEAN CATCH   Final    Special Requests NONE   Final    Culture  Setup Time [id number]   Final    Colony Count NO GROWTH   Final    Culture NO GROWTH  Final    Report Status 09/24/2011 FINAL   Final     Coagulation Studies: No results found for this basename: LABPROT:5,INR:5 in the last 72 hours  Imaging: Ct Head Wo Contrast  12/13/2011  *RADIOLOGY REPORT*  Clinical Data: Left facial droop.  Slurred speech.  Syncope. Possible seizure  CT HEAD WITHOUT CONTRAST  Technique:  Contiguous axial images were obtained from the base of the skull through the vertex without contrast.  Comparison: 04/13/2011 MR.  03/25/2011 CT.  Findings: Motion degraded exam.  No intracranial hemorrhage.  Remote infarcts within the thalami and lenticular nucleus more notable on the left.  Remote right paracentral pontine infarct.  Tiny hypodensity posterior right  frontal lobe (series 2 image 21) not seen on prior exam.  Small acute infarct not entirely excluded. No CT evidence of large acute infarct.  No intracranial mass lesion detected on this unenhanced exam.  Global atrophy without hydrocephalus.  Vascular calcifications.  IMPRESSION: Remote infarcts and small vessel disease type changes without CT evidence of large acute infarct or intracranial hemorrhage.  Small acute infarct of the posterior right frontal lobe not excluded (series 2 image 21) although this may be related to result of small vessel disease.  Global atrophy without hydrocephalus  Results discussed with Dr. Amada Jupiter A 03/23/2012 4:22 p.m.  Original Report Authenticated By: Fuller Canada, M.D.     No results found for this or any previous visit (from the past 240 hour(s)).   Medications:  Scheduled:   . apixaban  2.5 mg Oral BID  . aspirin  81 mg Oral Once  . atorvastatin  10 mg Oral Daily  . calcium-vitamin D  1 tablet Oral BID  . carvedilol  20 mg Oral Daily  . ferrous sulfate  325 mg Oral TID WC  . ferrous sulfate  325 mg Oral Q breakfast  . glimepiride  2 mg Oral QAC breakfast  . insulin aspart  0-9 Units Subcutaneous TID WC  . irbesartan  150 mg Oral BID  . pantoprazole  40 mg Oral Q1200  . pregabalin  50 mg Oral QHS  . rOPINIRole  2 mg Oral QID  . DISCONTD: sodium chloride   Intravenous STAT  . DISCONTD: calcium-vitamin D  1 tablet Oral BID    Assessment/Plan: Assessment: 78 y.o. female with transient altered mental status to which she is amnestic. Concerned that it may have been a small seizure. At this point differential, however,also includes TIA which will also need to be worked up. Also, given the history of episodes associated with periods of intense stress, a conversion reaction cannot be completely ruled out, but this would only be entertained if the other 2 possibilities are ruled out.  PENDING:  Hba1C, EEG, Carotid Doppler, MRI/MRA headPT/OT.    Recommend: 1) Continue stroke work up 2) EEG--Will be done today. If no seizure activity noted would increase Lyrical to 75 mg bid.  If activity is noted will make further recommendations.  3) If MRI is negative would D/C asa 81 mg unless cardiology feels necessary.     LOS: 1 day   Felicie Morn PA-C [facility] [phone]  12/14/2011, 8:55 AM

## 2011-12-14 NOTE — Progress Notes (Signed)
Routine EEG completed.  

## 2011-12-14 NOTE — Evaluation (Signed)
Clinical/Bedside Swallow Evaluation Patient Details  Name: Elizabeth Pugh MRN: [id number] Date of Birth: [birth year]  Today's Date: 12/14/2011 Time: 0930-0950 SLP Time Calculation (min): 20 min  Past Medical History:  Past Medical History  Diagnosis Date  . Stroke   . GERD (gastroesophageal reflux disease)   . Hypertension   . Diabetes mellitus   . Blood transfusion   . Arthritis   . Anxiety   . Anemia   . GI (gastrointestinal bleed)     Due to benign mass  . Bronchitis   . Restless leg syndrome   . Atrial fibrillation   . Acute delirium 04/12/2011   Past Surgical History:  Past Surgical History  Procedure Date  . Eye surgery     Cataract removal  . Abdominal hysterectomy   . Tubal ligation   . Eus 09/24/2011    Procedure: UPPER ENDOSCOPIC ULTRASOUND (EUS) LINEAR;  Surgeon: Theda Belfast, MD;  Location: [facility];  Service: Endoscopy;  Laterality: N/A;   HPI:  78 y/o female admitted to ED with stroke like symptoms. CT scan completed on 12/13/11 indicates small acute infarct of the posterior right frontal lobe vs. Results vs. Result of small vessel disease.  MRI pending.  No prior history of dysphagia .  Patient referred for BSE per stroke protocol.        Assessment / Plan / Recommendation Clinical Impression  No evidence of dysphagia. Oropharyngeal swallow functional for regular consistency and thin liquids.  No  Outward s/s of aspiration noted throughout evaluation.  No further speech language pathology warranted.  ST to sign off      Aspiration Risk  Mild    Diet Recommendation Regular;Thin liquid   Liquid Administration via: Cup;Straw Medication Administration: Whole meds with liquid Supervision: Patient able to self feed    Other  Recommendations Oral Care Recommendations: Oral care BID   Follow Up Recommendations  None            Swallow Study Prior Functional Status  Cognitive/Linguistic Baseline: Within functional limits Type of Home:  House Lives With: Daughter Available Help at Discharge: Family Education: 12 th grade education Vocation: Retired    Radio producer Date of Onset: 12/12/11 HPI: 78 y/o female admitted to ED with stroke like symptoms. No prior history of dysphagia .  Patient referred for BSE per stroke protocol.   Type of Study: Bedside swallow evaluation Previous Swallow Assessment: n/a Diet Prior to this Study: Regular;Thin liquids Temperature Spikes Noted: No Respiratory Status: Room air History of Recent Intubation: No Behavior/Cognition: Alert;Cooperative;Pleasant mood Oral Cavity - Dentition: Adequate natural dentition Self-Feeding Abilities: Able to feed self Patient Positioning: Upright in bed Baseline Vocal Quality: Clear Volitional Cough: Strong Volitional Swallow: Able to elicit    Oral/Motor/Sensory Function Overall Oral Motor/Sensory Function: Appears within functional limits for tasks assessed Labial Symmetry: Abnormal symmetry left   Ice Chips Ice chips: Not tested   Thin Liquid Thin Liquid: Within functional limits Presentation: Cup;Straw    Nectar Thick Nectar Thick Liquid: Not tested   Honey Thick Honey Thick Liquid: Not tested   Puree Puree: Not tested   Solid   Moreen Fowler MS, CCC-SLP [phone]    Solid: Within functional limits Presentation: Self Fed       Kalifa Cadden 12/14/2011,10:13 AM

## 2011-12-14 NOTE — Progress Notes (Addendum)
Subjective: Feels ok, no further episodes of confusion  Objective: Vital signs in last 24 hours: Temp:  [97.6 F (36.4 C)-98.1 F (36.7 C)] 97.6 F (36.4 C) (08/12 1407) Pulse Rate:  [66-95] 76  (08/12 1407) Resp:  [16-18] 18  (08/12 1407) BP: (138-182)/(63-102) 174/102 mmHg (08/12 1407) SpO2:  [100 %] 100 % (08/12 1407) Weight:  [85.8 kg (189 lb 2.5 oz)] 85.8 kg (189 lb 2.5 oz) (08/11 1944) Weight change:  Last BM Date: 12/13/11  Intake/Output from previous day: 08/11 0701 - 08/12 0700 In: 1265 [P.O.:440; I.V.:825] Out: 1150 [Urine:1150] Total I/O In: 240 [P.O.:240] Out: 710 [Urine:710]   Physical Exam: General: Awake, Oriented x3, No acute distress.  HEENT: EOMI, Moist mucous membranes  Neck: Supple  CV: S1 and S2 /RRR Lungs: Clear to ascultation bilaterally  Abdomen: Soft, Nontender, Nondistended, +bowel sounds.  Ext: Good pulses. Trace edema. No clubbing or cyanosis noted.  Neuro: Cranial Nerves II-XII grossly intact. Has 5/5 motor strength in upper extremities bilaterally, 4/5 motor strength in left lower extremity, 5/5 motor strength in right lower extremity..    Lab Results: Basic Metabolic Panel:  Basename 12/13/11 1602 12/13/11 1546  NA 140 138  K 4.5 4.4  CL 106 104  CO2 -- 26  GLUCOSE 145* 152*  BUN 24* 22  CREATININE 0.80 0.83  CALCIUM -- 9.6  MG -- --  PHOS -- --   Liver Function Tests: No results found for this basename: AST:2,ALT:2,ALKPHOS:2,BILITOT:2,PROT:2,ALBUMIN:2 in the last 72 hours No results found for this basename: LIPASE:2,AMYLASE:2 in the last 72 hours No results found for this basename: AMMONIA:2 in the last 72 hours CBC:  Basename 12/13/11 1602 12/13/11 1546  WBC -- 3.4*  NEUTROABS -- 1.7  HGB 11.9* 10.0*  HCT 35.0* 33.0*  MCV -- 83.5  PLT -- 157   Cardiac Enzymes: No results found for this basename: CKTOTAL:3,CKMB:3,CKMBINDEX:3,TROPONINI:3 in the last 72 hours BNP: No results found for this basename: PROBNP:3 in the  last 72 hours D-Dimer: No results found for this basename: DDIMER:2 in the last 72 hours CBG:  Basename 12/14/11 1340 12/14/11 0642 12/13/11 2112 12/13/11 1543  GLUCAP 95 132* 158* 150*   Hemoglobin A1C:  Basename 12/13/11 1546  HGBA1C 6.4*   Fasting Lipid Panel:  Basename 12/14/11 0445  CHOL 105  HDL 47  LDLCALC 43  TRIG 75  CHOLHDL 2.2  LDLDIRECT --   Thyroid Function Tests: No results found for this basename: TSH,T4TOTAL,FREET4,T3FREE,THYROIDAB in the last 72 hours Anemia Panel: No results found for this basename: VITAMINB12,FOLATE,FERRITIN,TIBC,IRON,RETICCTPCT in the last 72 hours Coagulation: No results found for this basename: LABPROT:2,INR:2 in the last 72 hours Urine Drug Screen: Drugs of Abuse     Component Value Date/Time   LABOPIA NONE DETECTED 12/13/2011 1656   COCAINSCRNUR NONE DETECTED 12/13/2011 1656   LABBENZ NONE DETECTED 12/13/2011 1656   AMPHETMU NONE DETECTED 12/13/2011 1656   THCU NONE DETECTED 12/13/2011 1656   LABBARB NONE DETECTED 12/13/2011 1656    Alcohol Level: No results found for this basename: ETH:2 in the last 72 hours Urinalysis:  Basename 12/13/11 1656  COLORURINE AMBER*  LABSPEC 1.029  PHURINE 5.0  GLUCOSEU NEGATIVE  HGBUR TRACE*  BILIRUBINUR SMALL*  KETONESUR 15*  PROTEINUR 30*  UROBILINOGEN 1.0  NITRITE NEGATIVE  LEUKOCYTESUR TRACE*    No results found for this or any previous visit (from the past 240 hour(s)).  Studies/Results: Ct Head Wo Contrast  12/13/2011  *RADIOLOGY REPORT*  Clinical Data: Left facial droop.  Slurred  speech.  Syncope. Possible seizure  CT HEAD WITHOUT CONTRAST  Technique:  Contiguous axial images were obtained from the base of the skull through the vertex without contrast.  Comparison: 04/13/2011 MR.  03/25/2011 CT.  Findings: Motion degraded exam.  No intracranial hemorrhage.  Remote infarcts within the thalami and lenticular nucleus more notable on the left.  Remote right paracentral pontine infarct.   Tiny hypodensity posterior right frontal lobe (series 2 image 21) not seen on prior exam.  Small acute infarct not entirely excluded. No CT evidence of large acute infarct.  No intracranial mass lesion detected on this unenhanced exam.  Global atrophy without hydrocephalus.  Vascular calcifications.  IMPRESSION: Remote infarcts and small vessel disease type changes without CT evidence of large acute infarct or intracranial hemorrhage.  Small acute infarct of the posterior right frontal lobe not excluded (series 2 image 21) although this may be related to result of small vessel disease.  Global atrophy without hydrocephalus  Results discussed with Dr. Amada Jupiter A 03/23/2012 4:22 p.m.  Original Report Authenticated By: Fuller Canada, M.D.   Mri Brain Without Contrast  12/14/2011  *RADIOLOGY REPORT*  Clinical Data:  Hypertension.  Atrial fibrillation.  Diabetes. Mental status changes.  Confusion.  Speech disturbance.  MRI HEAD WITHOUT CONTRAST MRA HEAD WITHOUT CONTRAST  Technique:  Multiplanar, multiecho pulse sequences of the brain and surrounding structures were obtained without intravenous contrast. Angiographic images of the head were obtained using MRA technique without contrast.  Comparison:  Head CT 12/13/2011  MRI HEAD  Findings:  Diffusion imaging does not show any acute or subacute infarction.  There are old small vessel infarctions within the right side of the pons. There are old small vessel insults affecting the thalami and basal ganglia.  No focal cerebellar insult.  The cerebral hemispheres show atrophy with minimal small vessel change within the deep white matter.  No cortical or large vessel territory infarction.  No mass lesion, hemorrhage, hydrocephalus or extra-axial collection.  No pituitary mass.  No inflammatory sinus disease.  There is a small amount of fluid in the mastoid air cells on the left.  IMPRESSION: No acute finding.  Old small vessel infarctions within the pons, thalami and  basal ganglia.  MRA HEAD  Findings: Both internal carotid arteries are patent into the brain. There is mild atherosclerotic irregularity in the siphon regions but no stenosis.  The anterior and middle cerebral vessels are patent without correctable proximal stenosis, aneurysm or vascular malformation.  Distal branch vessels show atherosclerotic irregularity.  Both vertebral arteries are patent to the basilar.  No basilar stenosis.  Posterior circulation branch vessels are patent.  Distal vessels show atherosclerotic irregularity.  IMPRESSION: No major vessel occlusion or correctable proximal stenosis. Atherosclerotic irregularity of the more distal branch vessels diffusely.  Original Report Authenticated By: Thomasenia Sales, M.D.   Mr Mra Head/brain Wo Cm  12/14/2011  *RADIOLOGY REPORT*  Clinical Data:  Hypertension.  Atrial fibrillation.  Diabetes. Mental status changes.  Confusion.  Speech disturbance.  MRI HEAD WITHOUT CONTRAST MRA HEAD WITHOUT CONTRAST  Technique:  Multiplanar, multiecho pulse sequences of the brain and surrounding structures were obtained without intravenous contrast. Angiographic images of the head were obtained using MRA technique without contrast.  Comparison:  Head CT 12/13/2011  MRI HEAD  Findings:  Diffusion imaging does not show any acute or subacute infarction.  There are old small vessel infarctions within the right side of the pons. There are old small vessel insults affecting the thalami  and basal ganglia.  No focal cerebellar insult.  The cerebral hemispheres show atrophy with minimal small vessel change within the deep white matter.  No cortical or large vessel territory infarction.  No mass lesion, hemorrhage, hydrocephalus or extra-axial collection.  No pituitary mass.  No inflammatory sinus disease.  There is a small amount of fluid in the mastoid air cells on the left.  IMPRESSION: No acute finding.  Old small vessel infarctions within the pons, thalami and basal ganglia.   MRA HEAD  Findings: Both internal carotid arteries are patent into the brain. There is mild atherosclerotic irregularity in the siphon regions but no stenosis.  The anterior and middle cerebral vessels are patent without correctable proximal stenosis, aneurysm or vascular malformation.  Distal branch vessels show atherosclerotic irregularity.  Both vertebral arteries are patent to the basilar.  No basilar stenosis.  Posterior circulation branch vessels are patent.  Distal vessels show atherosclerotic irregularity.  IMPRESSION: No major vessel occlusion or correctable proximal stenosis. Atherosclerotic irregularity of the more distal branch vessels diffusely.  Original Report Authenticated By: Thomasenia Sales, M.D.    Medications: Scheduled Meds:   . apixaban  2.5 mg Oral BID  . aspirin  81 mg Oral Once  . atorvastatin  10 mg Oral Daily  . calcium-vitamin D  1 tablet Oral BID  . carvedilol  20 mg Oral Daily  . ferrous sulfate  325 mg Oral TID WC  . ferrous sulfate  325 mg Oral Q breakfast  . glimepiride  2 mg Oral QAC breakfast  . insulin aspart  0-9 Units Subcutaneous TID WC  . irbesartan  150 mg Oral BID  . pantoprazole  40 mg Oral Q1200  . pregabalin  75 mg Oral BID  . rOPINIRole  2 mg Oral QID  . DISCONTD: sodium chloride   Intravenous STAT  . DISCONTD: calcium-vitamin D  1 tablet Oral BID  . DISCONTD: pregabalin  50 mg Oral QHS   Continuous Infusions:   . sodium chloride 75 mL/hr at 12/13/11 2000  . DISCONTD: sodium chloride     PRN Meds:.acetaminophen, docusate sodium  Assessment/Plan: Altered mental status/encephalopathy - Transient Likely due to TIA versus seizure.  Await MRI/MRA of the brain, carotid Dopplers, PT and OT consultation.  EEG done results pending  Discussed with Dr. Amada Jupiter, will continue Apixaban  Continue pregabalin.  Passed swallow screen  Patient had a 2-D echocardiogram on 09/23/2011 which showed cavity size was normal, wall thickness was increased  in a pattern of mild LVH, systolic function was normal, ejection fraction 55-60%, will not repeat echocardiogram as patient is already anticoagulated on Aprixaban.   Atrial fibrillation  Rate controlled. Continue Aprixaban, for anticoagulation. Management per Dr. Sharyn Lull, as outpatient.   Hypertension  Continue home antihypertensive medications. Continue carvedilol, and telmisartan (irbesartan substituted).   Type 2 diabetes uncontrolled with complications  Hold metformin. Continue glyburide. Sensitive sliding scale insulin.   Anemia  Hemoglobin stable. Due to iron deficiency anemia, serum iron on May 2013 was less than 10. Continue supplemental iron.   History of CVA with left-sided deficits/history of TIA in 2012  Stable. As above.   Restless leg syndrome  Continue Ropinirole.   Hypercholesterolemia  Continue statin.   Prophylaxis  Apixaban.   CODE STATUS  Full code.      LOS: 1 day   [facility] Pager: [phone] 12/14/2011, 3:53 PM

## 2011-12-14 NOTE — Evaluation (Signed)
Occupational Therapy Evaluation Patient Details Name: Elizabeth Pugh MRN: [id number] DOB: 09-Feb-1927 Today's Date: 12/14/2011 Time: 4098-1191 OT Time Calculation (min): 20 min  OT Assessment / Plan / Recommendation Clinical Impression  Pt was adm s/p episode of decreased speech and with "blacking out." She reports her RLE has been progressively weakening over the last several months (to the point she needed assist up/down her steps to enter home.Pt reports current weakness is no worse than it has been recently. Spoke with pt re: d/c plan. Pt agrees SNF will be most appropriate option at this time as her dtr is to have knee surgery soon    OT Assessment  Patient needs continued OT Services    Follow Up Recommendations  Skilled nursing facility    Barriers to Discharge Decreased caregiver support    Equipment Recommendations  Defer to next venue    Recommendations for Other Services    Frequency  Min 2X/week    Precautions / Restrictions Precautions Precautions: Fall Restrictions Weight Bearing Restrictions: No   Pertinent Vitals/Pain Pt reports feeling a headache "coming on" but did not rate pain. Also, reports occasional left knee pain but did not rate.    ADL  Grooming: Simulated;Minimal assistance Where Assessed - Grooming: Supported standing Upper Body Bathing: Simulated;Set up;Supervision/safety Where Assessed - Upper Body Bathing: Supported sitting Lower Body Bathing: Simulated;Maximal assistance Where Assessed - Lower Body Bathing: Supported sit to stand Upper Body Dressing: Simulated;Minimal assistance Where Assessed - Upper Body Dressing: Supported sitting Lower Body Dressing: Simulated;Maximal assistance Where Assessed - Lower Body Dressing: Supported sit to Pharmacist, hospital: Mining engineer Method: Sit to Barista:  (from bed) Toileting - Clothing Manipulation and Hygiene: +1 Total  assistance;Performed Where Assessed - Glass blower/designer Manipulation and Hygiene: Standing (pt unable to let go of RW to assist) Transfers/Ambulation Related to ADLs: Min a with RW ambulation throughout room; pt with reports of left knee buckling during ambulation    OT Diagnosis: Generalized weakness;Acute pain  OT Problem List: Decreased activity tolerance;Impaired balance (sitting and/or standing);Decreased knowledge of use of DME or AE;Pain OT Treatment Interventions: Self-care/ADL training;DME and/or AE instruction;Therapeutic activities;Patient/family education;Balance training   OT Goals Acute Rehab OT Goals OT Goal Formulation: With patient Time For Goal Achievement: 12/28/11 Potential to Achieve Goals: Good ADL Goals Pt Will Perform Grooming: with modified independence;Standing at sink ADL Goal: Grooming - Progress: Goal set today Pt Will Perform Upper Body Dressing: Independently;Sitting, bed;Sitting, chair ADL Goal: Upper Body Dressing - Progress: Goal set today Pt Will Perform Lower Body Dressing: with min assist;Sit to stand from bed;Sit to stand from chair ADL Goal: Lower Body Dressing - Progress: Goal set today Pt Will Transfer to Toilet: with modified independence;Ambulation;with DME ADL Goal: Toilet Transfer - Progress: Goal set today Pt Will Perform Toileting - Clothing Manipulation: with modified independence;Standing ADL Goal: Toileting - Clothing Manipulation - Progress: Goal set today Pt Will Perform Toileting - Hygiene: Independently;Sitting on 3-in-1 or toilet ADL Goal: Toileting - Hygiene - Progress: Goal set today  Visit Information  Last OT Received On: 12/14/11 Assistance Needed: +1 PT/OT Co-Evaluation/Treatment: Yes    Subjective Data  Subjective: I wet the bed- it's just flowing out of me Patient Stated Goal: Return home with daughter after dtr's surgeries   Prior Functioning  Vision/Perception  Home Living Lives With: Daughter Available Help at  Discharge: Other (Comment) (unknown; daughter to have surgery) Type of Home: House Home Access: Stairs to enter [employer] of Steps:  5 Entrance Stairs-Rails: Right;Left Home Layout: One level Bathroom Shower/Tub: Other (comment);Tub/shower unit (sponge bath) Bathroom Toilet: Handicapped height Bathroom Accessibility: Yes How Accessible: Accessible via walker Home Adaptive Equipment: Walker - rolling;Walker - four wheeled;Tub transfer bench;Grab bars in shower;Bedside commode/3-in-1 Prior Function Level of Independence: Needs assistance Needs Assistance: Meal Prep;Light Housekeeping (stairs) Able to Take Stairs?: Yes Driving: No Vocation: Retired Musician: No difficulties Dominant Hand: Right      Cognition  Overall Cognitive Status: Impaired Area of Impairment: Other (comment) (occasional comment off topic) Arousal/Alertness: Awake/alert Orientation Level: Appears intact for tasks assessed Behavior During Session: [facility] for tasks performed    Extremity/Trunk Assessment Right Lower Extremity Assessment RLE ROM/Strength/Tone: Deficits RLE ROM/Strength/Tone Deficits: Knee extension 4/5; DF 5/5 RLE Coordination: WFL - gross motor Left Lower Extremity Assessment LLE ROM/Strength/Tone: Deficits LLE ROM/Strength/Tone Deficits: previous CVA; knee extension 3+/5, DF 4/5 LLE Coordination: WFL - gross motor Trunk Assessment Trunk Assessment: Kyphotic   Mobility Bed Mobility Bed Mobility: Supine to Sit;Sitting - Scoot to Edge of Bed Supine to Sit: 4: Min guard;With rails;HOB elevated Sitting - Scoot to Edge of Bed: 4: Min assist;With rail Details for Bed Mobility Assistance: assist to scoot to EOB with pad (pt initiating movement, just could not overcome friction to slide to EOB) Transfers Sit to Stand: 4: Min assist;With upper extremity assist;From bed Stand to Sit: 4: Min assist;With upper extremity assist;With armrests;To chair/3-in-1 Details for  Transfer Assistance: vc for safe use of RW (pt trying to pull up on RW); assist to extend hips and knees due to weakness   Exercise    Balance    End of Session OT - End of Session Equipment Utilized During Treatment: Gait belt Activity Tolerance: Patient tolerated treatment well Patient left: in chair;with call bell/phone within reach Nurse Communication: Mobility status  GO     Vallery Mcdade 12/14/2011, 3:43 PM

## 2011-12-14 NOTE — Discharge Summary (Addendum)
Physician Discharge Summary  Patient ID: Elizabeth Pugh MRN: [id number] DOB/AGE: 1926/05/30 78 y.o.  Admit date: 12/13/2011 Discharge date: 12/14/2011  Primary Care Physician:  Dorrene German, MD    Disposition and Follow-up:  Dr.Harwani in 1 week Neurology in 1 month   Discharge Diagnoses:     Seizure vs TIA  DM (diabetes mellitus), type 2, uncontrolled with complications  CVA, old, hemiparesis  Hypertension  Atrial fibrillation on Apixaban  Anxiety  Anemia  Restless leg syndrome  Diverticulosis GERD    Medication List  As of 12/14/2011  3:01 PM   ASK your doctor about these medications         acetaminophen 650 MG CR tablet   Commonly known as: TYLENOL   Take 650 mg by mouth every 8 (eight) hours as needed. For pain      apixaban 2.5 MG Tabs tablet   Commonly known as: ELIQUIS   Take 2.5 mg by mouth 2 (two) times daily.      atorvastatin 10 MG tablet   Commonly known as: LIPITOR   Take 10 mg by mouth daily.      Calcium 500+D 500-400 MG-UNIT per tablet   Generic drug: calcium-vitamin D   Take 1 tablet by mouth 2 (two) times daily.      CALCIUM-VITAMIN A-VITAMIN D PO   Take 1 tablet by mouth daily.      carvedilol 20 MG 24 hr capsule   Commonly known as: COREG CR   Take 20 mg by mouth daily.      docusate sodium 100 MG capsule   Commonly known as: COLACE   Take 100 mg by mouth 2 (two) times daily as needed. For constipation      esomeprazole 40 MG capsule   Commonly known as: NEXIUM   Take 40 mg by mouth daily before breakfast.      ferrous sulfate 325 (65 FE) MG tablet   Take 325 mg by mouth daily with breakfast.      glimepiride 2 MG tablet   Commonly known as: AMARYL   Take 2 mg by mouth daily before breakfast.      insulin aspart 100 UNIT/ML injection   Commonly known as: novoLOG   Inject into the skin 3 (three) times daily before meals.      metFORMIN 750 MG 24 hr tablet   Commonly known as: GLUCOPHAGE-XR   Take 750 mg by mouth 2  (two) times daily.      pregabalin 50 MG capsule   Commonly known as: LYRICA   Take 50 mg by mouth at bedtime.      rOPINIRole 2 MG tablet   Commonly known as: REQUIP   Take 2 mg by mouth 4 (four) times daily.      telmisartan 40 MG tablet   Commonly known as: MICARDIS   Take 40 mg by mouth 2 (two) times daily.      VOLTAREN 1 % Gel   Generic drug: diclofenac sodium   Apply 1 application topically 4 (four) times daily as needed. For pain           Consults:  Neurology Dr.Kirkpatrick   Significant Diagnostic Studies:  Ct Head Wo Contrast  12/13/2011  *RADIOLOGY REPORT*  Clinical Data: Left facial droop.  Slurred speech.  Syncope. Possible seizure  CT HEAD WITHOUT CONTRAST  Technique:  Contiguous axial images were obtained from the base of the skull through the vertex without contrast.  Comparison: 04/13/2011 MR.  03/25/2011 CT.  Findings: Motion degraded exam.  No intracranial hemorrhage.  Remote infarcts within the thalami and lenticular nucleus more notable on the left.  Remote right paracentral pontine infarct.  Tiny hypodensity posterior right frontal lobe (series 2 image 21) not seen on prior exam.  Small acute infarct not entirely excluded. No CT evidence of large acute infarct.  No intracranial mass lesion detected on this unenhanced exam.  Global atrophy without hydrocephalus.  Vascular calcifications.  IMPRESSION: Remote infarcts and small vessel disease type changes without CT evidence of large acute infarct or intracranial hemorrhage.  Small acute infarct of the posterior right frontal lobe not excluded (series 2 image 21) although this may be related to result of small vessel disease.  Global atrophy without hydrocephalus  Results discussed with Dr. Amada Jupiter A 03/23/2012 4:22 p.m.  Original Report Authenticated By: Fuller Canada, M.D.   Mri Brain Without Contrast  12/14/2011  *RADIOLOGY REPORT*  Clinical Data:  Hypertension.  Atrial fibrillation.  Diabetes. Mental status  changes.  Confusion.  Speech disturbance.  MRI HEAD WITHOUT CONTRAST MRA HEAD WITHOUT CONTRAST  Technique:  Multiplanar, multiecho pulse sequences of the brain and surrounding structures were obtained without intravenous contrast. Angiographic images of the head were obtained using MRA technique without contrast.  Comparison:  Head CT 12/13/2011  MRI HEAD  Findings:  Diffusion imaging does not show any acute or subacute infarction.  There are old small vessel infarctions within the right side of the pons. There are old small vessel insults affecting the thalami and basal ganglia.  No focal cerebellar insult.  The cerebral hemispheres show atrophy with minimal small vessel change within the deep white matter.  No cortical or large vessel territory infarction.  No mass lesion, hemorrhage, hydrocephalus or extra-axial collection.  No pituitary mass.  No inflammatory sinus disease.  There is a small amount of fluid in the mastoid air cells on the left.  IMPRESSION: No acute finding.  Old small vessel infarctions within the pons, thalami and basal ganglia.  MRA HEAD  Findings: Both internal carotid arteries are patent into the brain. There is mild atherosclerotic irregularity in the siphon regions but no stenosis.  The anterior and middle cerebral vessels are patent without correctable proximal stenosis, aneurysm or vascular malformation.  Distal branch vessels show atherosclerotic irregularity.  Both vertebral arteries are patent to the basilar.  No basilar stenosis.  Posterior circulation branch vessels are patent.  Distal vessels show atherosclerotic irregularity.  IMPRESSION: No major vessel occlusion or correctable proximal stenosis. Atherosclerotic irregularity of the more distal branch vessels diffusely.  Original Report Authenticated By: Thomasenia Sales, M.D.   Mr Mra Head/brain Wo Cm  12/14/2011  *RADIOLOGY REPORT*  Clinical Data:  Hypertension.  Atrial fibrillation.  Diabetes. Mental status changes.   Confusion.  Speech disturbance.  MRI HEAD WITHOUT CONTRAST MRA HEAD WITHOUT CONTRAST  Technique:  Multiplanar, multiecho pulse sequences of the brain and surrounding structures were obtained without intravenous contrast. Angiographic images of the head were obtained using MRA technique without contrast.  Comparison:  Head CT 12/13/2011  MRI HEAD  Findings:  Diffusion imaging does not show any acute or subacute infarction.  There are old small vessel infarctions within the right side of the pons. There are old small vessel insults affecting the thalami and basal ganglia.  No focal cerebellar insult.  The cerebral hemispheres show atrophy with minimal small vessel change within the deep white matter.  No cortical or large vessel territory infarction.  No mass lesion, hemorrhage,  hydrocephalus or extra-axial collection.  No pituitary mass.  No inflammatory sinus disease.  There is a small amount of fluid in the mastoid air cells on the left.  IMPRESSION: No acute finding.  Old small vessel infarctions within the pons, thalami and basal ganglia.  MRA HEAD  Findings: Both internal carotid arteries are patent into the brain. There is mild atherosclerotic irregularity in the siphon regions but no stenosis.  The anterior and middle cerebral vessels are patent without correctable proximal stenosis, aneurysm or vascular malformation.  Distal branch vessels show atherosclerotic irregularity.  Both vertebral arteries are patent to the basilar.  No basilar stenosis.  Posterior circulation branch vessels are patent.  Distal vessels show atherosclerotic irregularity.  IMPRESSION: No major vessel occlusion or correctable proximal stenosis. Atherosclerotic irregularity of the more distal branch vessels diffusely.  Original Report Authenticated By: Thomasenia Sales, M.D.   EEG Diagnosis: 1) FIRDA  Clinical Interpretation: This borderlince EEG demonstrates FIRDA. Though this is associated with encephalopathy, it is also seen in  normal elderly adults during drowsiness. There was no seizure or seizure predisposition recorded on this study    Brief H and P: Elizabeth Pugh is a 78 y.o. African American female with history of hypertension, A. fib, restless leg syndrome, diabetes, anxiety, anemia, history of GI bleed due to benign mass and arthritis who presents with the above complaints. Patient was visiting her daughter at [facility] today and after she left the hospital at around 245 p.m. she was noted to be incoherent, and confused. Most of the history was provided by daughter and niece, niece witnessed the event. Patient was babbling at times, she had confused the blue chucks as water bottles. Niece also thought that the left side of the face may have been more droopy, worse from her baseline deficits on the left from previous stroke. Approximately at 315 p.m. she may have lost consciousness or may have "blacked out." As a result she was brought to the emergency department for further evaluation.  Upon presentation to the emergency department at 330 p.m. her symptoms improved and she was at her baseline per family during my examination of the patient. Denies any recent fevers, chills, nausea, vomiting, chest pain or shortness of breath. Has had some headaches at times. Denies any vision change. Patient has a left-sided weakness, more prominent in the left lower extremity, and left face deficits from previous stroke. Patient in the past has had episodes of unresponsiveness, had been treated for seizures, her neurologist had stopped her seizure medications    Hospital Course:  Altered mental status/altered mental status/encephalopathy  Likely due to Seizure vs TIA , had MRI/MRA brain done and normal also had EEG done which did not show any epileptiform activity Seen by Neurology, D/W Dr.Kirkpatrick they suspect the event was most likely a seizure and recommended increasing Lyrica to 75mg  BID at DC, she is advised to  FU with Neurology. And continue Apixaban  Patient had a 2-D echocardiogram on 09/23/2011 which showed cavity size was normal, wall thickness was increased in a pattern of mild LVH, systolic function was normal, ejection fraction 55-60%, will not repeat echocardiogram as patient is already anticoagulated on Aprixaban.   Atrial fibrillation  Rate controlled. Continue Aprixaban, for anticoagulation. Management per Dr. Sharyn Lull, as outpatient.   Hypertension  Continue home antihypertensive medications. Continue carvedilol, and telmisartan (irbesartan substituted).   Type 2 diabetes uncontrolled with complications  Hold metformin. Continue glyburide. Sensitive sliding scale insulin.   Anemia  Hemoglobin stable. Due to iron deficiency anemia, serum iron on May 2013 was less than 10. Continue supplemental iron.   History of CVA with left-sided deficits/history of TIA in 2012  Stable. As above.   Restless leg syndrome  Continue Ropinirole.   Hypercholesterolemia  Continue statin.      Time spent on Discharge:  Signed: Laurna Shetley [facility] Pager: [phone] 12/14/2011, 3:01 PM

## 2011-12-14 NOTE — Progress Notes (Signed)
12/14/11 Spoke with patient about d/c plans, explained that PT/OT recommending SNF.Patient stated that she lives with her daughter and will discuss this with her. She gave me permission to speak with her daughter Geralyn Flash. Contacted her daughter, she is agreeable with SNF due to patient's abilty to ambulate has been declining. Patient has had HHPT/OT with [facility] in the past with little improvement. Ms Orson Aloe will be visiting the patient this evening and discuss SNF with her.Made referral to CSW. CM will continue to follow for d/c needs.  Jacquelynn Cree RN, BSN, CCM

## 2011-12-14 NOTE — Progress Notes (Signed)
Bilateral:  No evidence of hemodynamically significant internal carotid artery stenosis.  There appears to be 40-59% stenosis in the mid ICA bilaterally by velocities, probably due to vessel tortuosity.   Vertebral artery flow is antegrade.

## 2011-12-14 NOTE — Procedures (Signed)
History: 78 yo F with a h/o cva who presents with episode of AMS and facial "distortion"   Sedation: None  Background: There is a well defined posterior dominant rhythm of 9.5 Hz that attenuates with eye opening. She has some central theta range frequencies as well as slow activity associated with drowsiness. There was frontally predominant intermittent rhythmic delta activity(FIRDA).   Photic stimulation: Physiologic driving is present  EEG Diagnosis: 1) FIRDA  Clinical Interpretation: This borderlince EEG demonstrates FIRDA. Though this is associated with encephalopathy, it is also seen in normal elderly adults during drowsiness. There was no seizure or seizure predisposition recorded on this study.   Ritta Slot, MD [facility] [phone]

## 2011-12-14 NOTE — Progress Notes (Signed)
PT Cancellation Note  Evaluation cancelled at this time due to patient receiving procedure or test. Will return later today  Elizabeth Pugh 12/14/2011, 10:45 AM Pager [phone]

## 2011-12-14 NOTE — Evaluation (Signed)
Speech Language Pathology Evaluation Patient Details Name: Elizabeth Pugh MRN: [id number] DOB: Sep 18, 1926 Today's Date: 12/14/2011 Time: 0910-0930 SLP Time Calculation (min): 20 min  Problem List:  Patient Active Problem List  Diagnosis  . DM (diabetes mellitus), type 2, uncontrolled with complications  . CVA, old, hemiparesis  . Hypertension  . Acute delirium  . Atrial fibrillation  . Anxiety  . Anemia  . Restless leg syndrome   Past Medical History:  Past Medical History  Diagnosis Date  . Stroke   . GERD (gastroesophageal reflux disease)   . Hypertension   . Diabetes mellitus   . Blood transfusion   . Arthritis   . Anxiety   . Anemia   . GI (gastrointestinal bleed)     Due to benign mass  . Bronchitis   . Restless leg syndrome   . Atrial fibrillation   . Acute delirium 04/12/2011   Past Surgical History:  Past Surgical History  Procedure Date  . Eye surgery     Cataract removal  . Abdominal hysterectomy   . Tubal ligation   . Eus 09/24/2011    Procedure: UPPER ENDOSCOPIC ULTRASOUND (EUS) LINEAR;  Surgeon: Theda Belfast, MD;  Location: [facility];  Service: Endoscopy;  Laterality: N/A;   HPI:  Elizabeth Pugh is a 78 y.o. African American female with history of hypertension, A. fib, restless leg syndrome, diabetes, anxiety, anemia, history of GI bleed due to benign mass and arthritis.   Patient was visiting her daughter at [facility] today and after she left the hospital at around 245 p.m. she was noted to be incoherent, and confused.  Most of the history was provided by daughter and niece, niece witnessed the event.  Niece also thought that the left side of the face may have been more droopy, worse from her baseline deficits on the left from previous stroke.  Approximately at 315 p.m. she may have lost consciousness or may have "blacked out."  As a result she was brought to the emergency department for further evaluation.  Patient referred for Cognitive  Linguistic Evaluation per stroke protocol.   Assessment / Plan / Recommendation Clinical Impression  Cognitive Linguistice skills judged to be baseline.  No further speech language pathology services warranted. ST to sign off.     SLP Assessment  Patient does not need any further Speech Lanaguage Pathology Services    Follow Up Recommendations  None           SLP Evaluation Prior Functioning  Cognitive/Linguistic Baseline: Within functional limits Type of Home: House Lives With: Daughter Available Help at Discharge: Family Education: 12 th grade education Vocation: Retired   IT consultant  Overall Cognitive Status: Appears within functional limits for tasks assessed Arousal/Alertness: Awake/alert Orientation Level: Oriented X4    Comprehension  Auditory Comprehension Overall Auditory Comprehension: Appears within functional limits for tasks assessed    Expression Expression Primary Mode of Expression: Verbal Verbal Expression Overall Verbal Expression: Appears within functional limits for tasks assessed Written Expression Dominant Hand: Right Written Expression: Not tested   Oral / Motor Oral Motor/Sensory Function Overall Oral Motor/Sensory Function: Appears within functional limits for tasks assessed Motor Speech Overall Motor Speech: Appears within functional limits for tasks assessed   Moreen Fowler MS, CCC-SLP [phone]     [facility] 12/14/2011, 10:02 AM

## 2011-12-14 NOTE — Progress Notes (Signed)
SLP Cancellation Note  Addendum to prior Cognitive Linguistic Evaluation.  Evaluation completed in error with no charges made.  No deficits noted in areas of cognitive and linguistics.  Moreen Fowler MS, CCC-SLP [phone]  [facility] 12/14/2011, 1:23 PM

## 2011-12-15 DIAGNOSIS — R404 Transient alteration of awareness: Secondary | ICD-10-CM

## 2011-12-15 DIAGNOSIS — G2581 Restless legs syndrome: Secondary | ICD-10-CM

## 2011-12-15 LAB — GLUCOSE, CAPILLARY
Glucose-Capillary: 112 mg/dL — ABNORMAL HIGH (ref 70–99)
Glucose-Capillary: 137 mg/dL — ABNORMAL HIGH (ref 70–99)

## 2011-12-15 LAB — URINE CULTURE
Colony Count: NO GROWTH
Culture: NO GROWTH

## 2011-12-15 MED ORDER — PREGABALIN 75 MG PO CAPS: 75.0000 mg | ORAL_CAPSULE | Freq: Two times a day (BID) | ORAL | Status: DC

## 2011-12-15 NOTE — Progress Notes (Signed)
Physical Therapy Treatment Patient Details Name: Elizabeth Pugh MRN: [id number] DOB: 1926/05/30 Today's Date: 12/15/2011 Time: 4098-1191 PT Time Calculation (min): 16 min  PT Assessment / Plan / Recommendation Comments on Treatment Session  Patient agreeable to ambulation however once up patient requested to return to bed. Patient stated that she had just gotten out of recliner and didn't want to sit back up in it. Continue to recommend SNF as unsure who will be with the patient 24/7 A at home.     Follow Up Recommendations  Skilled nursing facility;Supervision/Assistance - 24 hour    Barriers to Discharge        Equipment Recommendations  Defer to next venue    Recommendations for Other Services    Frequency Min 3X/week   Plan Discharge plan remains appropriate;Frequency remains appropriate    Precautions / Restrictions Precautions Precautions: Fall   Pertinent Vitals/Pain     Mobility  Bed Mobility Bed Mobility: Sit to Supine Supine to Sit: 4: Min assist Sitting - Scoot to Edge of Bed: 4: Min assist Sit to Supine: 4: Min assist Details for Bed Mobility Assistance: A with trunk support OOB and for legs back into bed. Cues for safety and technique Transfers Sit to Stand: 4: Min assist;With upper extremity assist;From bed Stand to Sit: 4: Min assist;To bed;With upper extremity assist Details for Transfer Assistance: Cues not to pull up using RW. A to initiate stand as patient unable to stand fully upright without assistance. Patient fell back onto bed. Cues to control descent Ambulation/Gait Ambulation/Gait Assistance: 4: Min assist Ambulation Distance (Feet): 20 Feet Assistive device: Rolling walker Ambulation/Gait Assistance Details: Patient became very fatique with ambulation today. Requested to return to bed. Cues for safety with RW and to look forward and stand upright Gait Pattern: Decreased stride length;Step-through pattern;Shuffle    Exercises     PT  Diagnosis:    PT Problem List:   PT Treatment Interventions:     PT Goals Acute Rehab PT Goals PT Goal: Supine/Side to Sit - Progress: Progressing toward goal PT Goal: Sit to Supine/Side - Progress: Progressing toward goal PT Goal: Sit to Stand - Progress: Progressing toward goal PT Goal: Ambulate - Progress: Progressing toward goal  Visit Information  Last PT Received On: 12/15/11 Assistance Needed: +1    Subjective Data      Cognition  Overall Cognitive Status: Impaired Area of Impairment: Safety/judgement Arousal/Alertness: Awake/alert Orientation Level: Appears intact for tasks assessed Behavior During Session: [facility] for tasks performed    Balance     End of Session PT - End of Session Equipment Utilized During Treatment: Gait belt Activity Tolerance: Patient limited by fatigue Patient left: in chair;with call bell/phone within reach Nurse Communication: Mobility status   GP     Fredrich Birks 12/15/2011, 3:05 PM 12/15/2011 Fredrich Birks PTA [phone] pager [phone] office

## 2011-12-15 NOTE — Progress Notes (Signed)
Pt dc instructions provided to pt. Pt verbalized understanding. Iv dc intact. Prescription given to pt. Pt under no s/s distress.

## 2011-12-15 NOTE — Progress Notes (Signed)
[facility] PROGRESS NOTE    SUBJECTIVE   No complainants at this time.  Reading news paper.   OBJECTIVE   Vital signs in last 24 hours: Temp:  [97.6 F (36.4 C)-98 F (36.7 C)] 97.6 F (36.4 C) (08/13 0600) Pulse Rate:  [66-89] 73  (08/13 0600) Resp:  [17-18] 18  (08/13 0600) BP: (126-174)/(64-102) 171/87 mmHg (08/13 0600) SpO2:  [99 %-100 %] 100 % (08/13 0600)  Intake/Output from previous day: 08/12 0701 - 08/13 0700 In: 600 [P.O.:600] Out: 1110 [Urine:1110] Intake/Output this shift:   Nutritional status: Carb Control  Past Medical History  Diagnosis Date  . Stroke   . GERD (gastroesophageal reflux disease)   . Hypertension   . Diabetes mellitus   . Blood transfusion   . Arthritis   . Anxiety   . Anemia   . GI (gastrointestinal bleed)     Due to benign mass  . Bronchitis   . Restless leg syndrome   . Atrial fibrillation   . Acute delirium 04/12/2011    Neurologic ROS negative with exception of above   Neurologic Exam:   Mental Status:  Alert, interactive, follows commands.  Cranial Nerves:  II-Visual fields grossly intact.  III/IV/VI-Extraocular movements intact. Pupils reactive bilaterally. Ptosis Present left eye.  V/VII-Smile symmetric  VIII-grossly intact  XI-bilateral shoulder shrug  XII-midline tongue extension  Motor: 5/5 UE bilaterally and 4/5 on left EL(chronic from previous stroke)  Sensory: Pinprick and light touch intact throughout, bilaterally  Deep Tendon Reflexes:  Right: Upper Extremity  Left: Upper extremity  biceps (C-5 to C-6)   2/4 biceps (C-5 to C-6) 2/4  tricep (C7) 2/4   triceps (C7) 2/4  Brachioradialis (C6) 2/4  Brachioradialis (C6) 2/4  Lower Extremity   Lower Extremity  quadriceps (L-2 to L-4) 2/4  quadriceps (L-2 to L-4) 2/4   Cerebellar: Normal finger-to-nose,    Lab Results: Lab Results  Component Value Date/Time   CHOL 105 12/14/2011  4:45 AM   Lipid  Panel  Basename 12/14/11 0445  CHOL 105  TRIG 75  HDL 47  CHOLHDL 2.2  VLDL 15  LDLCALC 43    Studies/Results: Ct Head Wo Contrast  12/13/2011  *RADIOLOGY REPORT*  Clinical Data: Left facial droop.  Slurred speech.  Syncope. Possible seizure  CT HEAD WITHOUT CONTRAST  Technique:  Contiguous axial images were obtained from the base of the skull through the vertex without contrast.  Comparison: 04/13/2011 MR.  03/25/2011 CT.  Findings: Motion degraded exam.  No intracranial hemorrhage.  Remote infarcts within the thalami and lenticular nucleus more notable on the left.  Remote right paracentral pontine infarct.  Tiny hypodensity posterior right frontal lobe (series 2 image 21) not seen on prior exam.  Small acute infarct not entirely excluded. No CT evidence of large acute infarct.  No intracranial mass lesion detected on this unenhanced exam.  Global atrophy without hydrocephalus.  Vascular calcifications.  IMPRESSION: Remote infarcts and small vessel disease type changes without CT evidence of large acute infarct or intracranial hemorrhage.  Small acute infarct of the posterior right frontal lobe not excluded (series 2 image 21) although this may be related to result of small vessel disease.  Global atrophy without hydrocephalus  Results discussed with Dr. Amada Jupiter A 03/23/2012 4:22  p.m.  Original Report Authenticated By: Fuller Canada, M.D.   Mri Brain Without Contrast  12/14/2011  *RADIOLOGY REPORT*  Clinical Data:  Hypertension.  Atrial fibrillation.  Diabetes. Mental status changes.  Confusion.  Speech disturbance.  MRI HEAD WITHOUT CONTRAST MRA HEAD WITHOUT CONTRAST  Technique:  Multiplanar, multiecho pulse sequences of the brain and surrounding structures were obtained without intravenous contrast. Angiographic images of the head were obtained using MRA technique without contrast.  Comparison:  Head CT 12/13/2011  MRI HEAD  Findings:  Diffusion imaging does not show any acute or subacute  infarction.  There are old small vessel infarctions within the right side of the pons. There are old small vessel insults affecting the thalami and basal ganglia.  No focal cerebellar insult.  The cerebral hemispheres show atrophy with minimal small vessel change within the deep white matter.  No cortical or large vessel territory infarction.  No mass lesion, hemorrhage, hydrocephalus or extra-axial collection.  No pituitary mass.  No inflammatory sinus disease.  There is a small amount of fluid in the mastoid air cells on the left.  IMPRESSION: No acute finding.  Old small vessel infarctions within the pons, thalami and basal ganglia.  MRA HEAD  Findings: Both internal carotid arteries are patent into the brain. There is mild atherosclerotic irregularity in the siphon regions but no stenosis.  The anterior and middle cerebral vessels are patent without correctable proximal stenosis, aneurysm or vascular malformation.  Distal branch vessels show atherosclerotic irregularity.  Both vertebral arteries are patent to the basilar.  No basilar stenosis.  Posterior circulation branch vessels are patent.  Distal vessels show atherosclerotic irregularity.  IMPRESSION: No major vessel occlusion or correctable proximal stenosis. Atherosclerotic irregularity of the more distal branch vessels diffusely.  Original Report Authenticated By: Thomasenia Sales, M.D.   Mr Mra Head/brain Wo Cm  12/14/2011  *RADIOLOGY REPORT*  Clinical Data:  Hypertension.  Atrial fibrillation.  Diabetes. Mental status changes.  Confusion.  Speech disturbance.  MRI HEAD WITHOUT CONTRAST MRA HEAD WITHOUT CONTRAST  Technique:  Multiplanar, multiecho pulse sequences of the brain and surrounding structures were obtained without intravenous contrast. Angiographic images of the head were obtained using MRA technique without contrast.  Comparison:  Head CT 12/13/2011  MRI HEAD  Findings:  Diffusion imaging does not show any acute or subacute infarction.  There  are old small vessel infarctions within the right side of the pons. There are old small vessel insults affecting the thalami and basal ganglia.  No focal cerebellar insult.  The cerebral hemispheres show atrophy with minimal small vessel change within the deep white matter.  No cortical or large vessel territory infarction.  No mass lesion, hemorrhage, hydrocephalus or extra-axial collection.  No pituitary mass.  No inflammatory sinus disease.  There is a small amount of fluid in the mastoid air cells on the left.  IMPRESSION: No acute finding.  Old small vessel infarctions within the pons, thalami and basal ganglia.  MRA HEAD  Findings: Both internal carotid arteries are patent into the brain. There is mild atherosclerotic irregularity in the siphon regions but no stenosis.  The anterior and middle cerebral vessels are patent without correctable proximal stenosis, aneurysm or vascular malformation.  Distal branch vessels show atherosclerotic irregularity.  Both vertebral arteries are patent to the basilar.  No basilar stenosis.  Posterior circulation branch vessels are patent.  Distal vessels show atherosclerotic irregularity.  IMPRESSION: No major vessel occlusion or correctable proximal stenosis. Atherosclerotic irregularity of the more  distal branch vessels diffusely.  Original Report Authenticated By: Thomasenia Sales, M.D.    Medications:     Scheduled:   . apixaban  2.5 mg Oral BID  . atorvastatin  10 mg Oral Daily  . calcium-vitamin D  1 tablet Oral BID  . carvedilol  20 mg Oral Daily  . ferrous sulfate  325 mg Oral TID WC  . ferrous sulfate  325 mg Oral Q breakfast  . glimepiride  2 mg Oral QAC breakfast  . insulin aspart  0-9 Units Subcutaneous TID WC  . irbesartan  150 mg Oral BID  . pantoprazole  40 mg Oral Q1200  . pregabalin  75 mg Oral BID  . rOPINIRole  2 mg Oral QID  . DISCONTD: pregabalin  50 mg Oral QHS    Assessment/Plan:   Assessment: 78 y.o. female with transient altered  mental status to which she is amnestic. Concerned that it may have been a small seizure. At this point differential, however,also includes TIA which will also need to be worked up. Also, given the history of episodes associated with periods of intense stress, a conversion reaction cannot be completely ruled out, but this would only be entertained if the other 2 possibilities are ruled out.  1) EEG normal -no epileptiform activity 2) MRI Negative for stroke 3) Carotid dopplers show no ICA stenosis.    Recommend:  1) D/C asa 81 mg daily 2) Continue Lyrica 75 mg BID  No further neurological work up warranted.  Neurology will S/O    Felicie Morn PA-C [facility] [phone]  12/15/2011, 9:42 AM

## 2011-12-15 NOTE — Care Management Note (Signed)
    Page 1 of 2   12/15/2011     2:38:06 PM   CARE MANAGEMENT NOTE 12/15/2011  Patient:  Elizabeth Pugh, Elizabeth Pugh   Account Number:  [id number]  Date Initiated:  12/14/2011  Documentation initiated by:  [facility]  Subjective/Objective Assessment:   Admitted with possible CVA.     Action/Plan:   PT eval-recommending SNF  OT eval-recommending SNF   Anticipated DC Date:  12/16/2011   Anticipated DC Plan:  SKILLED NURSING FACILITY  In-house referral  Clinical Social Worker      DC Planning Services  CM consult      Choice offered to / List presented to:             Status of service:  In process, will continue to follow Medicare Important Message given?   (If response is "NO", the following Medicare IM given date fields will be blank) Date Medicare IM given:   Date Additional Medicare IM given:    Discharge Disposition:    Per UR Regulation:  Reviewed for med. necessity/level of care/duration of stay  If discussed at Long Length of Stay Meetings, dates discussed:    Comments:  12/15/11 Onnie Boer, RN ,BSN 1435 PT WILL BE DC'D TO HOME TODAY WITH HH PT/OT/RN/AIDE AND SW WITH [facility].  PT WILL NEED TO BE TRANSPORTED HOME BY [facility]AR AROUND 4 PM TODAY WHEN FAMILY CAN BE THERE TO GET HER SETTLED.  CSW AWARE OF TRANSPORTATION NEEDS.  12/14/11 Spoke with patient about d/c plans, explained that PT/OT recommending SNF.Patient stated that he lives with his wife who is with him 24/7 and able to assist him, his daughter is Charity fundraiser and also assists him. He has rolling walker.He is agreeable to HHPT/OT. Patient gave permission to call family. MD spoke with with family and they are agreeable with HHC. Called daughter, she selected [facility] for HHPT and OT from [facility] list of [facility] agencies. Contacted Debbie with [facility] and requested HHPT and OT with d/c of today. Jacquelynn Cree RN, BSN, CCM

## 2011-12-15 NOTE — Progress Notes (Signed)
Subjective: Denies any pain. No specific complaints.  Objective: Vital signs in last 24 hours: Filed Vitals:   12/14/11 1845 12/14/11 2200 12/15/11 0200 12/15/11 0600  BP: 136/79 153/64 126/84 171/87  Pulse: 81 89 83 73  Temp: 98 F (36.7 C) 97.7 F (36.5 C) 97.8 F (36.6 C) 97.6 F (36.4 C)  TempSrc: Oral Oral Oral Oral  Resp: 18 18 17 18   Height:      Weight:      SpO2: 100% 99% 100% 100%   Weight change:   Intake/Output Summary (Last 24 hours) at 12/15/11 0914 Last data filed at 12/14/11 1852  Gross per 24 hour  Intake    360 ml  Output    750 ml  Net   -390 ml    Physical Exam: General: Awake, Oriented, No acute distress. HEENT: EOMI. Neck: Supple CV: S1 and S2 Lungs: Clear to ascultation bilaterally Abdomen: Soft, Nontender, Nondistended, +bowel sounds. Ext: Good pulses. Trace edema.  Lab Results: Basic Metabolic Panel:  Lab 12/13/11 1610 12/13/11 1546  NA 140 138  K 4.5 4.4  CL 106 104  CO2 -- 26  GLUCOSE 145* 152*  BUN 24* 22  CREATININE 0.80 0.83  CALCIUM -- 9.6  MG -- --  PHOS -- --   Liver Function Tests: No results found for this basename: AST:5,ALT:5,ALKPHOS:5,BILITOT:5,PROT:5,ALBUMIN:5 in the last 168 hours No results found for this basename: LIPASE:5,AMYLASE:5 in the last 168 hours No results found for this basename: AMMONIA:5 in the last 168 hours CBC:  Lab 12/13/11 1602 12/13/11 1546  WBC -- 3.4*  NEUTROABS -- 1.7  HGB 11.9* 10.0*  HCT 35.0* 33.0*  MCV -- 83.5  PLT -- 157   Cardiac Enzymes: No results found for this basename: CKTOTAL:5,CKMB:5,CKMBINDEX:5,TROPONINI:5 in the last 168 hours BNP (last 3 results)  Basename 09/27/11 1240 09/24/11 0335 09/23/11 0540  PROBNP 911.5* 1414.0* 1665.0*   CBG:  Lab 12/15/11 0656 12/14/11 2120 12/14/11 1701 12/14/11 1340 12/14/11 0642  GLUCAP 112* 127* 136* 95 132*    Basename 12/13/11 1546  HGBA1C 6.4*   Other Labs: No components found with this basename: POCBNP:3 No results  found for this basename: DDIMER:2 in the last 168 hours  Lab 12/14/11 0445  CHOL 105  HDL 47  LDLCALC 43  TRIG 75  CHOLHDL 2.2  LDLDIRECT --   No results found for this basename: TSH,T4TOTAL,FREET3,T3FREE,FREET4,THYROIDAB in the last 168 hours No results found for this basename: VITAMINB12:2,FOLATE:2,FERRITIN:2,TIBC:2,IRON:2,RETICCTPCT:2 in the last 168 hours  Micro Results: No results found for this or any previous visit (from the past 240 hour(s)).  Studies/Results: Ct Head Wo Contrast  12/13/2011  *RADIOLOGY REPORT*  Clinical Data: Left facial droop.  Slurred speech.  Syncope. Possible seizure  CT HEAD WITHOUT CONTRAST  Technique:  Contiguous axial images were obtained from the base of the skull through the vertex without contrast.  Comparison: 04/13/2011 MR.  03/25/2011 CT.  Findings: Motion degraded exam.  No intracranial hemorrhage.  Remote infarcts within the thalami and lenticular nucleus more notable on the left.  Remote right paracentral pontine infarct.  Tiny hypodensity posterior right frontal lobe (series 2 image 21) not seen on prior exam.  Small acute infarct not entirely excluded. No CT evidence of large acute infarct.  No intracranial mass lesion detected on this unenhanced exam.  Global atrophy without hydrocephalus.  Vascular calcifications.  IMPRESSION: Remote infarcts and small vessel disease type changes without CT evidence of large acute infarct or intracranial hemorrhage.  Small acute infarct  of the posterior right frontal lobe not excluded (series 2 image 21) although this may be related to result of small vessel disease.  Global atrophy without hydrocephalus  Results discussed with Dr. Amada Jupiter A 03/23/2012 4:22 p.m.  Original Report Authenticated By: Fuller Canada, M.D.   Mri Brain Without Contrast  12/14/2011  *RADIOLOGY REPORT*  Clinical Data:  Hypertension.  Atrial fibrillation.  Diabetes. Mental status changes.  Confusion.  Speech disturbance.  MRI HEAD  WITHOUT CONTRAST MRA HEAD WITHOUT CONTRAST  Technique:  Multiplanar, multiecho pulse sequences of the brain and surrounding structures were obtained without intravenous contrast. Angiographic images of the head were obtained using MRA technique without contrast.  Comparison:  Head CT 12/13/2011  MRI HEAD  Findings:  Diffusion imaging does not show any acute or subacute infarction.  There are old small vessel infarctions within the right side of the pons. There are old small vessel insults affecting the thalami and basal ganglia.  No focal cerebellar insult.  The cerebral hemispheres show atrophy with minimal small vessel change within the deep white matter.  No cortical or large vessel territory infarction.  No mass lesion, hemorrhage, hydrocephalus or extra-axial collection.  No pituitary mass.  No inflammatory sinus disease.  There is a small amount of fluid in the mastoid air cells on the left.  IMPRESSION: No acute finding.  Old small vessel infarctions within the pons, thalami and basal ganglia.  MRA HEAD  Findings: Both internal carotid arteries are patent into the brain. There is mild atherosclerotic irregularity in the siphon regions but no stenosis.  The anterior and middle cerebral vessels are patent without correctable proximal stenosis, aneurysm or vascular malformation.  Distal branch vessels show atherosclerotic irregularity.  Both vertebral arteries are patent to the basilar.  No basilar stenosis.  Posterior circulation branch vessels are patent.  Distal vessels show atherosclerotic irregularity.  IMPRESSION: No major vessel occlusion or correctable proximal stenosis. Atherosclerotic irregularity of the more distal branch vessels diffusely.  Original Report Authenticated By: Thomasenia Sales, M.D.   Mr Mra Head/brain Wo Cm  12/14/2011  *RADIOLOGY REPORT*  Clinical Data:  Hypertension.  Atrial fibrillation.  Diabetes. Mental status changes.  Confusion.  Speech disturbance.  MRI HEAD WITHOUT CONTRAST  MRA HEAD WITHOUT CONTRAST  Technique:  Multiplanar, multiecho pulse sequences of the brain and surrounding structures were obtained without intravenous contrast. Angiographic images of the head were obtained using MRA technique without contrast.  Comparison:  Head CT 12/13/2011  MRI HEAD  Findings:  Diffusion imaging does not show any acute or subacute infarction.  There are old small vessel infarctions within the right side of the pons. There are old small vessel insults affecting the thalami and basal ganglia.  No focal cerebellar insult.  The cerebral hemispheres show atrophy with minimal small vessel change within the deep white matter.  No cortical or large vessel territory infarction.  No mass lesion, hemorrhage, hydrocephalus or extra-axial collection.  No pituitary mass.  No inflammatory sinus disease.  There is a small amount of fluid in the mastoid air cells on the left.  IMPRESSION: No acute finding.  Old small vessel infarctions within the pons, thalami and basal ganglia.  MRA HEAD  Findings: Both internal carotid arteries are patent into the brain. There is mild atherosclerotic irregularity in the siphon regions but no stenosis.  The anterior and middle cerebral vessels are patent without correctable proximal stenosis, aneurysm or vascular malformation.  Distal branch vessels show atherosclerotic irregularity.  Both vertebral arteries  are patent to the basilar.  No basilar stenosis.  Posterior circulation branch vessels are patent.  Distal vessels show atherosclerotic irregularity.  IMPRESSION: No major vessel occlusion or correctable proximal stenosis. Atherosclerotic irregularity of the more distal branch vessels diffusely.  Original Report Authenticated By: Thomasenia Sales, M.D.    Medications: I have reviewed the patient's current medications. Scheduled Meds:   . apixaban  2.5 mg Oral BID  . atorvastatin  10 mg Oral Daily  . calcium-vitamin D  1 tablet Oral BID  . carvedilol  20 mg Oral  Daily  . ferrous sulfate  325 mg Oral TID WC  . ferrous sulfate  325 mg Oral Q breakfast  . glimepiride  2 mg Oral QAC breakfast  . insulin aspart  0-9 Units Subcutaneous TID WC  . irbesartan  150 mg Oral BID  . pantoprazole  40 mg Oral Q1200  . pregabalin  75 mg Oral BID  . rOPINIRole  2 mg Oral QID  . DISCONTD: pregabalin  50 mg Oral QHS   Continuous Infusions:   . sodium chloride 75 mL/hr at 12/13/11 2000   PRN Meds:.acetaminophen, docusate sodium  Assessment/Plan: Altered mental status/altered mental status/encephalopathy  Likely due to Seizure vs TIA , had MRI/MRA brain done and normal also had EEG done which did not show any epileptiform activity. Carotid dopplers on 12/14/2011 showed 40-59% stenosis in the mid ICA bilaterally. Seen by Neurology, D/W Dr.Kirkpatrick they suspect the event was most likely a seizure and recommended increasing Lyrica to 75mg  BID at DC, she is advised to FU with Neurology. Continue Apixaban. Patient had a 2-D echocardiogram on 09/23/2011 which showed cavity size was normal, wall thickness was increased in a pattern of mild LVH, systolic function was normal, ejection fraction 55-60%, will not repeat echocardiogram as patient is already anticoagulated on Aprixaban.   Bilateral Carotid Stenosis To have regular followup yearly as outpatient.  Atrial fibrillation  Rate controlled. Continue Aprixaban, for anticoagulation. Management per Dr. Sharyn Lull, as outpatient.   Hypertension  Continue home antihypertensive medications. Continue carvedilol, and telmisartan (irbesartan substituted).   Type 2 diabetes uncontrolled with complications  Resume metformin at discharge. Continue glyburide. Sensitive sliding scale insulin.   Anemia  Hemoglobin stable. Due to iron deficiency anemia, serum iron on May 2013 was less than 10. Continue supplemental iron.   History of CVA with left-sided deficits/history of TIA in 2012  Stable. As above.   Restless leg syndrome    Continue Ropinirole.   Hypercholesterolemia  Continue statin.   Disposition Discharge the patient home with home health.   LOS: 2 days  Elizabeth Pugh A, MD 12/15/2011, 9:14 AM

## 2011-12-16 NOTE — Progress Notes (Signed)
Patient evaluated for community based chronic disease management services with [facility] Care Management Program as a benefit of patient's [employer]. Patient will receive a post discharge transition of care call and will be evaluated for monthly home visits for assessments and disease process education.  Made inpatient Case Manager aware that [facility] Care Management following. Of note, [facility] Care Management services does not replace or interfere with any services that are arranged by inpatient case management or social work.  For additional questions or referrals please contact Anibal Henderson BSN RN [facility] Liaison at [phone].

## 2012-02-23 ENCOUNTER — Emergency Department (HOSPITAL_COMMUNITY): Payer: Medicare Other

## 2012-02-23 ENCOUNTER — Other Ambulatory Visit: Payer: Self-pay

## 2012-02-23 ENCOUNTER — Observation Stay (HOSPITAL_COMMUNITY)
Admission: EM | Admit: 2012-02-23 | Discharge: 2012-02-27 | Disposition: A | Discharge status: Home / Self Care | Payer: Medicare Other | Attending: Internal Medicine | Admitting: Internal Medicine

## 2012-02-23 ENCOUNTER — Encounter (HOSPITAL_COMMUNITY): Payer: Self-pay | Admitting: Emergency Medicine

## 2012-02-23 DIAGNOSIS — E118 Type 2 diabetes mellitus with unspecified complications: Secondary | ICD-10-CM | POA: Diagnosis present

## 2012-02-23 DIAGNOSIS — I10 Essential (primary) hypertension: Secondary | ICD-10-CM | POA: Insufficient documentation

## 2012-02-23 DIAGNOSIS — D649 Anemia, unspecified: Secondary | ICD-10-CM

## 2012-02-23 DIAGNOSIS — G40802 Other epilepsy, not intractable, without status epilepticus: Principal | ICD-10-CM | POA: Insufficient documentation

## 2012-02-23 DIAGNOSIS — I69359 Hemiplegia and hemiparesis following cerebral infarction affecting unspecified side: Secondary | ICD-10-CM

## 2012-02-23 DIAGNOSIS — G2581 Restless legs syndrome: Secondary | ICD-10-CM | POA: Insufficient documentation

## 2012-02-23 DIAGNOSIS — I4891 Unspecified atrial fibrillation: Secondary | ICD-10-CM | POA: Insufficient documentation

## 2012-02-23 DIAGNOSIS — Z79899 Other long term (current) drug therapy: Secondary | ICD-10-CM | POA: Insufficient documentation

## 2012-02-23 DIAGNOSIS — R569 Unspecified convulsions: Secondary | ICD-10-CM | POA: Diagnosis present

## 2012-02-23 DIAGNOSIS — I639 Cerebral infarction, unspecified: Secondary | ICD-10-CM

## 2012-02-23 DIAGNOSIS — I69959 Hemiplegia and hemiparesis following unspecified cerebrovascular disease affecting unspecified side: Secondary | ICD-10-CM | POA: Insufficient documentation

## 2012-02-23 DIAGNOSIS — E1165 Type 2 diabetes mellitus with hyperglycemia: Secondary | ICD-10-CM

## 2012-02-23 DIAGNOSIS — M79609 Pain in unspecified limb: Secondary | ICD-10-CM | POA: Insufficient documentation

## 2012-02-23 DIAGNOSIS — IMO0001 Reserved for inherently not codable concepts without codable children: Secondary | ICD-10-CM | POA: Insufficient documentation

## 2012-02-23 DIAGNOSIS — F419 Anxiety disorder, unspecified: Secondary | ICD-10-CM

## 2012-02-23 DIAGNOSIS — R410 Disorientation, unspecified: Secondary | ICD-10-CM

## 2012-02-23 HISTORY — DX: Unspecified convulsions: R56.9

## 2012-02-23 LAB — COMPREHENSIVE METABOLIC PANEL
AST: 14 U/L (ref 0–37)
Albumin: 3.7 g/dL (ref 3.5–5.2)
Alkaline Phosphatase: 72 U/L (ref 39–117)
Chloride: 104 mEq/L (ref 96–112)
Potassium: 4.4 mEq/L (ref 3.5–5.1)
Sodium: 138 mEq/L (ref 135–145)
Total Bilirubin: 0.9 mg/dL (ref 0.3–1.2)

## 2012-02-23 LAB — URINE MICROSCOPIC-ADD ON

## 2012-02-23 LAB — GLUCOSE, CAPILLARY

## 2012-02-23 LAB — CBC
HCT: 38.7 % (ref 36.0–46.0)
Hemoglobin: 12.4 g/dL (ref 12.0–15.0)
MCH: 27.5 pg (ref 26.0–34.0)
MCHC: 32 g/dL (ref 30.0–36.0)
MCV: 85.8 fL (ref 78.0–100.0)
Platelets: 165 10*3/uL (ref 150–400)
RBC: 4.51 MIL/uL (ref 3.87–5.11)
RDW: 15.5 % (ref 11.5–15.5)
WBC: 3.8 10*3/uL — ABNORMAL LOW (ref 4.0–10.5)

## 2012-02-23 LAB — URINALYSIS, ROUTINE W REFLEX MICROSCOPIC
Glucose, UA: 250 mg/dL — AB
Hgb urine dipstick: NEGATIVE
Ketones, ur: NEGATIVE mg/dL
Protein, ur: 100 mg/dL — AB

## 2012-02-23 LAB — TROPONIN I: Troponin I: <0.3 ng/mL (ref <0.30)

## 2012-02-23 MED ORDER — SODIUM CHLORIDE 0.9 % IJ SOLN
3.0000 mL | Freq: Two times a day (BID) | INTRAMUSCULAR | Status: DC
  Administered 2012-02-24 – 2012-02-27 (×6): 3 mL via INTRAVENOUS

## 2012-02-23 MED ORDER — FERROUS SULFATE 325 (65 FE) MG PO TABS
325.0000 mg | ORAL_TABLET | Freq: Every day | ORAL | Status: DC
  Administered 2012-02-24 – 2012-02-27 (×4): 325 mg via ORAL
  Filled 2012-02-23 (×5): qty 1

## 2012-02-23 MED ORDER — SODIUM CHLORIDE 0.9 % IV SOLN: 250.0000 mL | INTRAVENOUS | Status: DC | PRN

## 2012-02-23 MED ORDER — CARVEDILOL PHOSPHATE ER 20 MG PO CP24
20.0000 mg | ORAL_CAPSULE | Freq: Every day | ORAL | Status: DC
  Administered 2012-02-24 – 2012-02-27 (×4): 20 mg via ORAL
  Filled 2012-02-23 (×4): qty 1

## 2012-02-23 MED ORDER — LEVETIRACETAM 500 MG PO TABS: 500.0000 mg | ORAL_TABLET | Freq: Two times a day (BID) | ORAL | Status: DC

## 2012-02-23 MED ORDER — APIXABAN 2.5 MG PO TABS
2.5000 mg | ORAL_TABLET | Freq: Two times a day (BID) | ORAL | Status: DC
  Administered 2012-02-24 – 2012-02-27 (×8): 2.5 mg via ORAL
  Filled 2012-02-23 (×11): qty 1

## 2012-02-23 MED ORDER — SODIUM CHLORIDE 0.9 % IJ SOLN: 3.0000 mL | INTRAMUSCULAR | Status: DC | PRN

## 2012-02-23 MED ORDER — ATORVASTATIN CALCIUM 10 MG PO TABS
10.0000 mg | ORAL_TABLET | Freq: Every day | ORAL | Status: DC
  Administered 2012-02-24 – 2012-02-27 (×3): 10 mg via ORAL
  Filled 2012-02-23 (×5): qty 1

## 2012-02-23 MED ORDER — GLIMEPIRIDE 2 MG PO TABS
2.0000 mg | ORAL_TABLET | Freq: Every day | ORAL | Status: DC
  Administered 2012-02-24 – 2012-02-27 (×4): 2 mg via ORAL
  Filled 2012-02-23 (×5): qty 1

## 2012-02-23 MED ORDER — ROPINIROLE HCL 1 MG PO TABS
2.0000 mg | ORAL_TABLET | Freq: Four times a day (QID) | ORAL | Status: DC
  Administered 2012-02-24 – 2012-02-27 (×16): 2 mg via ORAL
  Filled 2012-02-23 (×18): qty 2

## 2012-02-23 MED ORDER — LEVETIRACETAM 500 MG PO TABS
500.0000 mg | ORAL_TABLET | Freq: Two times a day (BID) | ORAL | Status: DC
  Administered 2012-02-24 – 2012-02-27 (×8): 500 mg via ORAL
  Filled 2012-02-23 (×10): qty 1

## 2012-02-23 MED ORDER — SODIUM CHLORIDE 0.9 % IJ SOLN
3.0000 mL | Freq: Two times a day (BID) | INTRAMUSCULAR | Status: DC
  Administered 2012-02-24 – 2012-02-27 (×4): 3 mL via INTRAVENOUS

## 2012-02-23 MED ORDER — PANTOPRAZOLE SODIUM 40 MG PO TBEC
40.0000 mg | DELAYED_RELEASE_TABLET | Freq: Every day | ORAL | Status: DC
  Administered 2012-02-24 – 2012-02-27 (×4): 40 mg via ORAL
  Filled 2012-02-23 (×4): qty 1

## 2012-02-23 MED ORDER — IRBESARTAN 150 MG PO TABS
150.0000 mg | ORAL_TABLET | Freq: Every day | ORAL | Status: DC
  Administered 2012-02-24 – 2012-02-27 (×5): 150 mg via ORAL
  Filled 2012-02-23 (×5): qty 1

## 2012-02-23 NOTE — H&P (Signed)
PCP:   Dorrene German, MD   Chief Complaint:  seizure  HPI: 78 yo female h/o prev cva, dm, htn, afib lives at home with dtr who was noted today by her dtr after eating lunch to become unresponsive and having jerking movements of her upper ext for less than a minute and had drooling during the episode.  Pt came to in less than a minute and was very confused and her confusion gradually improved back to her baseline mental status in one to two hours.  No recent illness/ no n/v/d/fevers.  No rashes.  Pt was previously dx with seizure disorder but then was taken off her keppra a couple of months ago as it was unclear if she was really having seizures or tias.  dtr does not notice any instances of "starring into space".  Pt currently denies any pain, no focal neuro def.  There was no urinary or bowel incont.  Review of Systems:  O/w neg  Past Medical History: Past Medical History  Diagnosis Date  . Stroke   . GERD (gastroesophageal reflux disease)   . Hypertension   . Diabetes mellitus   . Blood transfusion   . Arthritis   . Anxiety   . Anemia   . GI (gastrointestinal bleed)     Due to benign mass  . Bronchitis   . Restless leg syndrome   . Atrial fibrillation   . Acute delirium 04/12/2011  . Seizures    Past Surgical History  Procedure Date  . Eye surgery     Cataract removal  . Abdominal hysterectomy   . Tubal ligation   . Eus 09/24/2011    Procedure: UPPER ENDOSCOPIC ULTRASOUND (EUS) LINEAR;  Surgeon: Theda Belfast, MD;  Location: [facility];  Service: Endoscopy;  Laterality: N/A;    Medications: Prior to Admission medications   Medication Sig Start Date End Date Taking? Authorizing Provider  acetaminophen (TYLENOL) 650 MG CR tablet Take 650 mg by mouth every 8 (eight) hours as needed. For pain    Yes Historical Provider, MD  apixaban (ELIQUIS) 2.5 MG TABS tablet Take 2.5 mg by mouth 2 (two) times daily.   Yes Historical Provider, MD  atorvastatin (LIPITOR) 10 MG tablet  Take 10 mg by mouth daily.   Yes Historical Provider, MD  calcium-vitamin D (CALCIUM 500+D) 500-400 MG-UNIT per tablet Take 1 tablet by mouth 2 (two) times daily.   Yes Historical Provider, MD  carvedilol (COREG CR) 20 MG 24 hr capsule Take 20 mg by mouth daily.     Yes Historical Provider, MD  esomeprazole (NEXIUM) 40 MG capsule Take 40 mg by mouth daily before breakfast.     Yes Historical Provider, MD  ferrous sulfate 325 (65 FE) MG tablet Take 325 mg by mouth daily with breakfast.   Yes Historical Provider, MD  glimepiride (AMARYL) 2 MG tablet Take 2 mg by mouth daily before breakfast.   Yes Historical Provider, MD  metFORMIN (GLUCOPHAGE-XR) 750 MG 24 hr tablet Take 750 mg by mouth 2 (two) times daily.    Yes Historical Provider, MD  pregabalin (LYRICA) 75 MG capsule Take 75 mg by mouth 3 (three) times daily as needed. For restless legs and nerve pain   Yes Historical Provider, MD  rOPINIRole (REQUIP) 2 MG tablet Take 2 mg by mouth 4 (four) times daily.    Yes Historical Provider, MD  telmisartan (MICARDIS) 40 MG tablet Take 40 mg by mouth 2 (two) times daily.   Yes Historical Provider,  MD  VOLTAREN 1 % GEL Apply 1 application topically 4 (four) times daily as needed. For pain 10/26/11  Yes Historical Provider, MD    Allergies:   Allergies  Allergen Reactions  . Erythromycin Other (See Comments)    Vaginal itching  . Penicillins Rash  . Zithromax (Azithromycin) Other (See Comments)    gave her a yeast infection.    Social History:  reports that she quit smoking about 26 years ago. Her smoking use included Cigarettes. She has never used smokeless tobacco. She reports that she does not drink alcohol or use illicit drugs.  Family History: Family History  Problem Relation Age of Onset  . Aneurysm Mother   . Heart attack Father   . Heart failure Sister   . Asthma Sister   . Kidney failure Sister   . Stroke Sister     Physical Exam: Filed Vitals:   02/23/12 1536 02/23/12 1600  02/23/12 1806 02/23/12 1845  BP: 141/91 129/84 151/91 146/107  Pulse: 79  88 87  Temp: 97.7 F (36.5 C)     TempSrc: Oral     Resp: 23 20 16 18   SpO2: 91%  95% 100%   General appearance: alert, cooperative and no distress Neck: no JVD and supple, symmetrical, trachea midline Lungs: clear to auscultation bilaterally Heart: regular rate and rhythm, S1, S2 normal, no murmur, click, rub or gallop Abdomen: soft, non-tender; bowel sounds normal; no masses,  no organomegaly Extremities: extremities normal, atraumatic, no cyanosis or edema Pulses: 2+ and symmetric Skin: Skin color, texture, turgor normal. No rashes or lesions Neurologic: Grossly normal    Labs on Admission:   [facility] 02/23/12 1657  NA 138  K 4.4  CL 104  CO2 24  GLUCOSE 174*  BUN 21  CREATININE 0.73  CALCIUM 9.8  MG --  PHOS --    Basename 02/23/12 1657  AST 14  ALT 7  ALKPHOS 72  BILITOT 0.9  PROT 6.8  ALBUMIN 3.7    Basename 02/23/12 1657  WBC 3.8*  NEUTROABS --  HGB 12.4  HCT 38.7  MCV 85.8  PLT 165    Basename 02/23/12 1657  CKTOTAL --  CKMB --  CKMBINDEX --  TROPONINI <0.30    Radiological Exams on Admission: Dg Chest 2 View  02/23/2012  *RADIOLOGY REPORT*  Clinical Data: Seizure, cough and  CHEST - 2 VIEW  Comparison: Chest x-ray of 09/23/2011  Findings: No active infiltrate or effusion is seen.  Moderate cardiomegaly is stable.  The bones are osteopenic.  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  IMPRESSION: Stable cardiomegaly.  No active lung disease.   Original Report Authenticated By: Juline Patch, M.D.    Ct Head Wo Contrast  02/23/2012  *RADIOLOGY REPORT*  Clinical Data: witnessed seizure by family, unknown length of seizure. Pt with hx of seizures, last seizure approx 2 months ago. Pt is still postictal. Pt vomiting en route  CT HEAD WITHOUT CONTRAST  Technique:  Contiguous axial images were obtained from the base of the skull through the vertex without  contrast.  Comparison: 12/14/2011  Findings: Small stable remote lacunar infarcts noted in the right pons, left thalamus, and bilateral putamen.  Otherwise, the brain stem, cerebellum, cerebral peduncles, thalami, basal ganglia, basilar cisterns, and ventricular system appear unremarkable.  No intracranial hemorrhage, mass lesion, or acute infarction is identified.  Atherosclerotic calcification of the carotid siphons noted.  IMPRESSION:  1.  Stable appearance of a small pontine, left  thalamic, and bilateral lentiform nucleus lacunar infarcts.  No acute intracranial findings.   Original Report Authenticated By: Dellia Cloud, M.D.     Assessment/Plan Present on Admission:  78 yo female with probable seizure .Seizure-like activity .Hypertension .Atrial fibrillation .Restless leg syndrome .DM (diabetes mellitus), type 2, uncontrolled with complications  Will place back on her keppra 500mg  bid (what she was on previously) and neuro consult.  Further w/u and recs per neuro but will give dose of keppra until further evaluation by their service.  No source of infection or recent illness to contribute.  ekg afib rate controlled.  On anticoagulants.  Tele monitoring.  sz precautions.  PT eval. Meyer Dockery A 02/23/2012, 7:17 PM

## 2012-02-23 NOTE — Consult Note (Signed)
Reason for Consult: Possible seizure Referring Physician: Eldridge Dace  CC: Loss of consciousness  History is obtained from: Patient, medical record  HPI: Elizabeth Pugh is an 78 y.o. female with a history of transient episodes were previously treated as seizures. Earlier today she remembers getting slightly dizzy, and subsequently losing consciousness. Her next memory is after EMS has arrived. Per witnesses, she was confused and took 1-2 hours come back to her baseline.   She is known to me from previous hospitalization at which point she had slumped over with left facial drooping. She was amnestic to that event. She had her lyrica increased at that time.   Of note, she feels the Lyrica makes her sleepy.   ROS: An 11 point ROS was performed and is negative except as noted in the HPI.  Past Medical History  Diagnosis Date  . Stroke   . GERD (gastroesophageal reflux disease)   . Hypertension   . Diabetes mellitus   . Blood transfusion   . Arthritis   . Anxiety   . Anemia   . GI (gastrointestinal bleed)     Due to benign mass  . Bronchitis   . Restless leg syndrome   . Atrial fibrillation   . Acute delirium 04/12/2011  . Seizures     Family History: No history of seizures  Social History: Tob: denies  Exam: Current vital signs: BP 147/98  Pulse 88  Temp 98.2 F (36.8 C) (Oral)  Resp 20  Ht 5\' 6"  (1.676 m)  Wt 80.1 kg (176 lb 9.4 oz)  BMI 28.50 kg/m2  SpO2 100% Vital signs in last 24 hours: Temp:  [97.7 F (36.5 C)-98.2 F (36.8 C)] 98.2 F (36.8 C) (10/22 2103) Pulse Rate:  [60-97] 88  (10/22 2103) Resp:  [15-23] 20  (10/22 2103) BP: (129-158)/(84-116) 147/98 mmHg (10/22 2103) SpO2:  [91 %-100 %] 100 % (10/22 2103) Weight:  [80.1 kg (176 lb 9.4 oz)] 80.1 kg (176 lb 9.4 oz) (10/22 2103)  General: In bed, no apparent distress CV: Regular rate and rhythm Mental Status: Patient is awake, alert, she is interactive and appropriate. Patient is able to give  a clear and coherent history. There is no evidence of aphasia Cranial Nerves: II: Visual Fields are full. Pupils are equal, round, and reactive to light.  Discs are difficult to visualize. III,IV, VI: EOMI without ptosis or diploplia.  V,VII: Facial sensation and movement are symmetric.  VIII: hearing is intact to voice X: Uvula elevates symmetrically XI: Shoulder shrug is symmetric. XII: tongue is midline without atrophy or fasciculations.  Motor: Tone is normal. Bulk is normal. 5/5 strength was present in her right arm and leg, as well as left arm, mild weakness and left leg Sensory: Sensation is symmetric to light touch and temperature in the arms and legs. Deep Tendon Reflexes: 1+ in right br and patella, 2+ in left br and patella Cerebellar: FNF  intact bilaterally Gait: Not assessed due to patient safety concerns  I have reviewed labs in epic and the results pertinent to this consultation are: No leukocytosis, CMP unremarkable  I have reviewed the images obtained: CT head-stable old infarcts  Impression: 78 year old female with loss of consciousness followed by postictal state. Given her previous episodes combined with this current episode, I do feel like she is most likely having recurrent seizures. She is on Lyrica, but given her sedation with this medication I would not advocate increasing the dose. She has been on Keppra in the  past and we can restart this.  Recommendations: 1) agree with Keppra 500 mg twice a day 2) Can follow with neurology as an outpaitent.    Ritta Slot, MD [facility] [phone]  If 7pm- 7am, please page neurology on call at [phone].

## 2012-02-23 NOTE — ED Notes (Signed)
EMS-pt with witnessed seizure by family, unknown length of seizure. Pt with hx of seizures, last seizure approx 2 months ago. Pt is still postictal. Pt vomiting en route. Moving all extremities.

## 2012-02-23 NOTE — ED Notes (Signed)
Patient transported to CT 

## 2012-02-23 NOTE — ED Provider Notes (Signed)
History     CSN: [id number]  Arrival date & time 02/23/12  1529   None     Chief Complaint  Patient presents with  . Seizures    (Consider location/radiation/quality/duration/timing/severity/associated sxs/prior treatment) Patient is a 78 y.o. female presenting with seizures. The history is provided by the patient and a relative.  Seizures  This is a recurrent problem. The current episode started less than 1 hour ago. The problem has been resolved. There was 1 seizure. The most recent episode lasted more than 5 minutes. Associated symptoms include sleepiness. Pertinent negatives include patient does not experience confusion, no headaches, no speech difficulty, no visual disturbance, no neck stiffness, no sore throat, no chest pain, no cough, no nausea, no vomiting, no diarrhea and no muscle weakness. Characteristics include rhythmic jerking and loss of consciousness. The episode was witnessed. The seizures did not continue in the ED. The seizure(s) had no focality. Possible causes include missed seizure meds (pt not currently on seizure medication). Possible causes do not include med or dosage change or recent illness. There has been no fever. There were no medications administered prior to arrival.    Past Medical History  Diagnosis Date  . Stroke   . GERD (gastroesophageal reflux disease)   . Hypertension   . Diabetes mellitus   . Blood transfusion   . Arthritis   . Anxiety   . Anemia   . GI (gastrointestinal bleed)     Due to benign mass  . Bronchitis   . Restless leg syndrome   . Atrial fibrillation   . Acute delirium 04/12/2011    Past Surgical History  Procedure Date  . Eye surgery     Cataract removal  . Abdominal hysterectomy   . Tubal ligation   . Eus 09/24/2011    Procedure: UPPER ENDOSCOPIC ULTRASOUND (EUS) LINEAR;  Surgeon: Theda Belfast, MD;  Location: [facility];  Service: Endoscopy;  Laterality: N/A;    Family History  Problem Relation Age of Onset    . Aneurysm Mother   . Heart attack Father   . Heart failure Sister   . Asthma Sister   . Kidney failure Sister   . Stroke Sister     History  Substance Use Topics  . Smoking status: Former Smoker    Types: Cigarettes    Quit date: 09/21/1985  . Smokeless tobacco: Never Used  . Alcohol Use: No    OB History    Grav Para Term Preterm Abortions TAB SAB Ect Mult Living                  Review of Systems  Constitutional: Negative for fever.  HENT: Negative for sore throat.   Eyes: Negative for visual disturbance.  Respiratory: Negative for cough and shortness of breath.   Cardiovascular: Negative for chest pain.  Gastrointestinal: Negative for nausea, vomiting, abdominal pain and diarrhea.  Neurological: Positive for seizures and loss of consciousness. Negative for speech difficulty and headaches.  Psychiatric/Behavioral: Negative for confusion.  All other systems reviewed and are negative.    Allergies  Erythromycin; Penicillins; and Zithromax  Home Medications   Current Outpatient Rx  Name Route Sig Dispense Refill  . ACETAMINOPHEN ER 650 MG PO TBCR Oral Take 650 mg by mouth every 8 (eight) hours as needed. For pain     . APIXABAN 2.5 MG PO TABS Oral Take 2.5 mg by mouth 2 (two) times daily.    . ATORVASTATIN CALCIUM 10 MG PO TABS Oral  Take 10 mg by mouth daily.    Marland Kitchen CALCIUM CARBONATE-VITAMIN D 500-400 MG-UNIT PO TABS Oral Take 1 tablet by mouth 2 (two) times daily.    Marland Kitchen CARVEDILOL PHOSPHATE ER 20 MG PO CP24 Oral Take 20 mg by mouth daily.      Marland Kitchen ESOMEPRAZOLE MAGNESIUM 40 MG PO CPDR Oral Take 40 mg by mouth daily before breakfast.      . FERROUS SULFATE 325 (65 FE) MG PO TABS Oral Take 325 mg by mouth daily with breakfast.    . GLIMEPIRIDE 2 MG PO TABS Oral Take 2 mg by mouth daily before breakfast.    . INSULIN ASPART 100 UNIT/ML [place] SOLN Subcutaneous Inject into the skin 3 (three) times daily before meals.    Marland Kitchen METFORMIN HCL ER 750 MG PO TB24 Oral Take 750 mg by  mouth 2 (two) times daily.     Marland Kitchen PREGABALIN 75 MG PO CAPS Oral Take 1 capsule (75 mg total) by mouth 2 (two) times daily. 60 capsule 0  . ROPINIROLE HCL 2 MG PO TABS Oral Take 2 mg by mouth 4 (four) times daily.     . TELMISARTAN 40 MG PO TABS Oral Take 40 mg by mouth 2 (two) times daily.    . VOLTAREN 1 % TD GEL Topical Apply 1 application topically 4 (four) times daily as needed. For pain      BP 141/91  Pulse 79  Temp 97.7 F (36.5 C) (Oral)  Resp 23  SpO2 91%  Physical Exam  Nursing note and vitals reviewed. Constitutional: She is oriented to person, place, and time. She appears well-developed and well-nourished. No distress.  HENT:  Head: Normocephalic and atraumatic.  Eyes: EOM are normal. Pupils are equal, round, and reactive to light.  Neck: Normal range of motion.  Cardiovascular: Normal rate and normal heart sounds.   Pulmonary/Chest: Effort normal and breath sounds normal. No respiratory distress.  Abdominal: Soft. She exhibits no distension. There is no tenderness.  Musculoskeletal:       Decreased ROM of bilateral legs (chronic)  Neurological: She is alert and oriented to person, place, and time. No cranial nerve deficit or sensory deficit. She exhibits normal muscle tone. Coordination normal. GCS eye subscore is 3. GCS verbal subscore is 5. GCS motor subscore is 6.  Skin: Skin is warm and dry.    ED Course  Procedures (including critical care time)  Labs Reviewed  CBC - Abnormal; Notable for the following:    WBC 3.8 (*)     All other components within normal limits  COMPREHENSIVE METABOLIC PANEL - Abnormal; Notable for the following:    Glucose, Bld 174 (*)     GFR calc non Af Amer 77 (*)     GFR calc Af Amer 89 (*)     All other components within normal limits  URINALYSIS, ROUTINE W REFLEX MICROSCOPIC - Abnormal; Notable for the following:    Color, Urine AMBER (*)  BIOCHEMICALS MAY BE AFFECTED BY COLOR   APPearance CLOUDY (*)     Glucose, UA 250 (*)      Bilirubin Urine SMALL (*)     Protein, ur 100 (*)     All other components within normal limits  URINE MICROSCOPIC-ADD ON - Abnormal; Notable for the following:    Casts HYALINE CASTS (*)  AND RARE GRANULAR AND RARE CELLULAR CASTS   All other components within normal limits  TROPONIN I  URINE CULTURE   Dg Chest 2 View  02/23/2012  *RADIOLOGY REPORT*  Clinical Data: Seizure, cough and  CHEST - 2 VIEW  Comparison: Chest x-ray of 09/23/2011  Findings: No active infiltrate or effusion is seen.  Moderate cardiomegaly is stable.  The bones are osteopenic.  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  IMPRESSION: Stable cardiomegaly.  No active lung disease.   Original Report Authenticated By: Juline Patch, M.D.    Ct Head Wo Contrast  02/23/2012  *RADIOLOGY REPORT*  Clinical Data: witnessed seizure by family, unknown length of seizure. Pt with hx of seizures, last seizure approx 2 months ago. Pt is still postictal. Pt vomiting en route  CT HEAD WITHOUT CONTRAST  Technique:  Contiguous axial images were obtained from the base of the skull through the vertex without contrast.  Comparison: 12/14/2011  Findings: Small stable remote lacunar infarcts noted in the right pons, left thalamus, and bilateral putamen.  Otherwise, the brain stem, cerebellum, cerebral peduncles, thalami, basal ganglia, basilar cisterns, and ventricular system appear unremarkable.  No intracranial hemorrhage, mass lesion, or acute infarction is identified.  Atherosclerotic calcification of the carotid siphons noted.  IMPRESSION:  1.  Stable appearance of a small pontine, left thalamic, and bilateral lentiform nucleus lacunar infarcts.  No acute intracranial findings.   Original Report Authenticated By: Dellia Cloud, M.D.     Date: 02/23/2012  Rate: 82  Rhythm: atrial fibrillation  QRS Axis: normal  Intervals: normal  ST/T Wave abnormalities: normal  Conduction Disutrbances:none  Narrative Interpretation:  Atrial Fibrillation  Old EKG Reviewed: unchanged     No diagnosis found.    MDM  4:03 PM Pt seen and examined. Pt with seizure-like activity for about 10 minutes who is gradually still returning to baseline. Will get baseline labs, EKG, and CT head. Unsure if these episodes are true seizures as patient had been taken off her seizure medicine. Concern for seizure v syncope. Pt does appear to have a post-ictal period that she is slowly recovering from. Pt will likely need admission.   6:53 PM Pt workup unremarkable. Will admit patient to medicine for further workup.        Daleen Bo, MD 02/24/12 [phone]

## 2012-02-23 NOTE — ED Notes (Signed)
Patient denies pain and is resting comfortably.  

## 2012-02-24 ENCOUNTER — Observation Stay (HOSPITAL_COMMUNITY): Payer: Medicare Other

## 2012-02-24 ENCOUNTER — Encounter (HOSPITAL_COMMUNITY): Payer: Self-pay

## 2012-02-24 DIAGNOSIS — I6350 Cerebral infarction due to unspecified occlusion or stenosis of unspecified cerebral artery: Secondary | ICD-10-CM

## 2012-02-24 DIAGNOSIS — R569 Unspecified convulsions: Secondary | ICD-10-CM

## 2012-02-24 DIAGNOSIS — R404 Transient alteration of awareness: Secondary | ICD-10-CM

## 2012-02-24 LAB — CBC
HCT: 35.5 % — ABNORMAL LOW (ref 36.0–46.0)
Hemoglobin: 11.4 g/dL — ABNORMAL LOW (ref 12.0–15.0)
RBC: 4.18 MIL/uL (ref 3.87–5.11)
RDW: 15.4 % (ref 11.5–15.5)
WBC: 4.4 10*3/uL (ref 4.0–10.5)

## 2012-02-24 LAB — GLUCOSE, CAPILLARY: Glucose-Capillary: 124 mg/dL — ABNORMAL HIGH (ref 70–99)

## 2012-02-24 LAB — BASIC METABOLIC PANEL
Chloride: 104 mEq/L (ref 96–112)
GFR calc Af Amer: 77 mL/min — ABNORMAL LOW (ref >90)
Potassium: 4.5 mEq/L (ref 3.5–5.1)
Sodium: 137 mEq/L (ref 135–145)

## 2012-02-24 LAB — URINE CULTURE
Colony Count: NO GROWTH
Culture: NO GROWTH

## 2012-02-24 LAB — HEMOGLOBIN A1C: Hgb A1c MFr Bld: 6.6 % — ABNORMAL HIGH (ref <5.7)

## 2012-02-24 MED ORDER — LORAZEPAM 2 MG/ML IJ SOLN
0.5000 mg | Freq: Once | INTRAMUSCULAR | Status: AC
  Administered 2012-02-25: 0.5 mg via INTRAVENOUS
  Filled 2012-02-24: qty 1

## 2012-02-24 MED ORDER — INSULIN ASPART 100 UNIT/ML ~~LOC~~ SOLN
0.0000 [IU] | Freq: Three times a day (TID) | SUBCUTANEOUS | Status: DC
  Administered 2012-02-24: 1 [IU] via SUBCUTANEOUS
  Administered 2012-02-25 – 2012-02-26 (×2): 2 [IU] via SUBCUTANEOUS
  Administered 2012-02-26: 1 [IU] via SUBCUTANEOUS
  Administered 2012-02-27: 2 [IU] via SUBCUTANEOUS
  Administered 2012-02-27: 1 [IU] via SUBCUTANEOUS

## 2012-02-24 NOTE — Evaluation (Signed)
Physical Therapy Evaluation Patient Details Name: Elizabeth Pugh MRN: [id number] DOB: 1927-04-10 Today's Date: 02/24/2012 Time: 4098-1191 PT Time Calculation (min): 17 min  PT Assessment / Plan / Recommendation Clinical Impression  78 yo adm with seizure and was lethargic and drifting off to sleep during PT session. No family present to discuss prior status and degree of assist available on d/c. Will make further recommendations after PT session 10/24, assuming pt is more alert and able to participate.    PT Assessment  Patient needs continued PT services    Follow Up Recommendations  Other (comment) (TBA when more alert; familly present)    Does the patient have the potential to tolerate intense rehabilitation      Barriers to Discharge Other (comment) (unknown degree of family support)      Equipment Recommendations  Other (comment) (TBA)    Recommendations for Other Services     Frequency Min 3X/week    Precautions / Restrictions Precautions Precautions: Fall   Pertinent Vitals/Pain Pain with Lt knee flexion (AAROM); pt reports it has been painful since 1990s      Mobility  Bed Mobility Bed Mobility: Rolling Right;Supine to Sit;Sitting - Scoot to [place] of Bed;Sit to Supine;Scooting to [facility] Rolling Right: 4: Min assist;With rail Supine to Sit: 4: Min guard;HOB flat;Other (comment) (pt pulled up to long sitting with Lt knee flexed 2/2 pain) Sitting - Scoot to Edge of Bed: 4: Min assist;With rail Sit to Supine: 4: Min assist;HOB flat Scooting to HOB: 1: +2 Total assist Scooting to [facility]: Patient Percentage: 0% Details for Bed Mobility Assistance: Pt with brief periods of arousal; most alert after Lt knee flexed attempting to achieve hookliying position; impulsive Transfers Transfers: Sit to Stand;Stand to Sit;Lateral/Scoot Transfers Sit to Stand: 3: Mod assist;With upper extremity assist;From bed Stand to Sit: 3: Mod assist;With upper extremity assist;To  bed Lateral/Scoot Transfers: 2: Max assist;Other (comment) (along EOB to pt's Lt to [facility]) Details for Transfer Assistance: After pt pulled herself to long-sitting, noted pt's bed pad was soaked with urine. Pt assisted to turn and put feet on floor. She was able to semi-stand with +1 assist to allow 2nd person to switch out a dry pad. Pt then returned to supine for EEG testing. Ambulation/Gait Ambulation/Gait Assistance: Not tested (comment)    Shoulder Instructions     Exercises     PT Diagnosis: Difficulty walking;Altered mental status  PT Problem List: Decreased strength;Decreased range of motion;Decreased activity tolerance;Decreased balance;Decreased mobility;Decreased knowledge of use of DME;Pain PT Treatment Interventions: DME instruction;Gait training;Stair training;Functional mobility training;Therapeutic activities;Balance training;Cognitive remediation;Patient/family education   PT Goals Acute Rehab PT Goals PT Goal Formulation: Patient unable to participate in goal setting Time For Goal Achievement: 03/02/12 Potential to Achieve Goals: Fair Pt will Roll Supine to Right Side: with modified independence PT Goal: Rolling Supine to Right Side - Progress: Goal set today Pt will Roll Supine to Left Side: with modified independence PT Goal: Rolling Supine to Left Side - Progress: Goal set today Pt will go Sit to Stand: with min assist;with upper extremity assist PT Goal: Sit to Stand - Progress: Goal set today Pt will go Stand to Sit: with supervision;with upper extremity assist PT Goal: Stand to Sit - Progress: Goal set today Pt will Transfer Bed to Chair/Chair to Bed: with min assist PT Transfer Goal: Bed to Chair/Chair to Bed - Progress: Goal set today Pt will Ambulate: 16 - 50 feet;with min assist;with least restrictive assistive device PT Goal: Ambulate -  Progress: Goal set today  Visit Information  Last PT Received On: 02/24/12 Assistance Needed: +2    Subjective Data   Subjective: Pt reports she just can't stay awake Patient Stated Goal: none obtained due to lethargy   Prior Functioning  Home Living Lives With: Daughter Available Help at Discharge: Family;Other (Comment) (availability unknown as family not present on eval) Type of Home: House Home Access: Stairs to enter Additional Comments: full home set-up not obtained as pt falling asleep throughout session and ?reliability Prior Function Comments: TBA when pt more alert and/or family present Communication Communication: Other (comment) (mumbling; falling asleep)    Cognition  Overall Cognitive Status: Difficult to assess Difficult to assess due to: Level of arousal Arousal/Alertness: Lethargic Behavior During Session: Lethargic Cognition - Other Comments: pt arouses for brief periods (up to 15 seconds)    Extremity/Trunk Assessment Right Lower Extremity Assessment RLE ROM/Strength/Tone: Deficits RLE ROM/Strength/Tone Deficits: knee flexion limited to 90 degrees supine (appears painful with arthritis); grossly 3+/5 RLE Coordination: WFL - gross motor Left Lower Extremity Assessment LLE ROM/Strength/Tone: Deficits;Due to pain LLE ROM/Strength/Tone Deficits: AAROM knee flexion 45 degrees with pt crying out in pain--reports due to arthritis and her prior stroke; less than 3/5 flexion, grossly 3+/5 hip/knee extension Trunk Assessment Trunk Assessment: Kyphotic   Balance Balance Balance Assessed: Yes Static Sitting Balance Static Sitting - Balance Support: Bilateral upper extremity supported;Feet supported Static Sitting - Level of Assistance: 4: Min assist  End of Session PT - End of Session Activity Tolerance: Patient limited by fatigue Patient left: in bed;with call bell/phone within reach;Other (comment) (with EEG person) Nurse Communication: Other (comment) (need for pericare and dry bottom sheet)  GP Functional Assessment Tool Used: clinical judgement Functional Limitation: Mobility:  Walking and moving around Mobility: Walking and Moving Around Current Status (B1478): At least 80 percent but less than 100 percent impaired, limited or restricted Mobility: Walking and Moving Around Goal Status [id number]): At least 1 percent but less than 20 percent impaired, limited or restricted   Kimiko Common 02/24/2012, 3:10 PM  Pager [phone]

## 2012-02-24 NOTE — Progress Notes (Signed)
[facility] PROGRESS NOTE  Elizabeth Pugh ZOX:[id number] DOB: 07-14-1926 DOA: 02/23/2012 PCP: Dorrene German, MD  Assessment/Plan: Principal Problem:  *Seizure-like activity Active Problems:  DM (diabetes mellitus), type 2, uncontrolled with complications  CVA, old, hemiparesis  Hypertension  Atrial fibrillation  Restless leg syndrome    1. Seizure-like activity: Patient has a previous history of query seizure disorder, and was initially managed with Keppra. This was reportedly discontinued some months ago, as it was unclear if she was having seizures vs TIAs. Have consulted Dr Thad Ranger, neurologist for input and recommendations. Meanwhile, back on Keppra. EEG pending. Brain MRI requested. Will request PT/POT evaluation.  2. Hypertension: Controlled on pre-admission antihypertensives.  3. Atrial fibrillation: Rate-controlled on Beta-blocker. Will need to review anticoagulant therapy, in view of fall risk with seizures. I did discuss with daughter, Bard Herbert by phone today, and she feels that patient is a t low fall risk, as she does not currently ambulate a lot.  4. Restless leg syndrome: Stable/Not problematic.  5. DM (diabetes mellitus), type 2: CBGs have been reasonable since admission. Manage with diet/SSI. Will likely discontinue Metformin, secondary to age.    Code Status: Full Code.  Family Communication:  Disposition Plan: To be determined.    Brief narrative: 78 yo female with history of previous CVA, DM-2, HTN, atrial fibrillation, GERD, and anxiety, who was noted on 02/23/12, after eating lunch to become unresponsive and having jerking movements of her upper extremities for less than a minute and had drooling during the episode. Patient came to in less than a minute, was very confused and her confusion gradually improved back to her baseline mental status in one to two hours. No recent acute illness. Pt was previously diagnosed with seizure disorder but then was taken  off her keppra a few months ago, as it was unclear if she was really having seizures or TIAs. She was admitted for further management.    Consultants:  Neurology.  Procedures:  CXR.  Head CT scan.   Antibiotics:  N/A.   HPI/Subjective: Asymptomatic.   Objective: Vital signs in last 24 hours: Temp:  [97.7 F (36.5 C)-99.6 F (37.6 C)] 98.4 F (36.9 C) (10/23 0531) Pulse Rate:  [60-97] 94  (10/23 0531) Resp:  [15-23] 18  (10/23 0531) BP: (119-158)/(71-116) 119/73 mmHg (10/23 0531) SpO2:  [91 %-100 %] 96 % (10/23 0531) Weight:  [80.1 kg (176 lb 9.4 oz)] 80.1 kg (176 lb 9.4 oz) (10/22 2103) Weight change:     Intake/Output from previous day: 10/22 0701 - 10/23 0700 In: 3 [I.V.:3] Out: -      Physical Exam: General: Comfortable, alert, communicative, oriented, not short of breath at rest.  HEENT:  No clinical pallor, no jaundice, no conjunctival injection or discharge. Hydration status is fair.  NECK:  Supple, JVP not seen, no carotid bruits, no palpable lymphadenopathy, no palpable goiter. CHEST:  Clinically clear to auscultation, no wheezes, no crackles. HEART:  Sounds 1 and 2 heard, normal, irregular, no murmurs. ABDOMEN:  Full, soft, non-tender, no palpable organomegaly, no palpable masses, normal bowel sounds. GENITALIA:  Not examined. LOWER EXTREMITIES:  No pitting edema, palpable peripheral pulses. MUSCULOSKELETAL SYSTEM:  Generalized osteoarthritic changes, otherwise, normal. CENTRAL NERVOUS SYSTEM:  No focal neurologic deficit on gross examination.  Lab Results:  Basename 02/24/12 0550 02/23/12 1657  WBC 4.4 3.8*  HGB 11.4* 12.4  HCT 35.5* 38.7  PLT 157 165    Basename 02/24/12 0550 02/23/12 1657  NA 137 138  K 4.5 4.4  CL 104 104  CO2 25 24  GLUCOSE 150* 174*  BUN 23 21  CREATININE 0.80 0.73  CALCIUM 9.5 9.8   No results found for this or any previous visit (from the past 240 hour(s)).   Studies/Results: Dg Chest 2 View  02/23/2012   *RADIOLOGY REPORT*  Clinical Data: Seizure, cough and  CHEST - 2 VIEW  Comparison: Chest x-ray of 09/23/2011  Findings: No active infiltrate or effusion is seen.  Moderate cardiomegaly is stable.  The bones are osteopenic.  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  IMPRESSION: Stable cardiomegaly.  No active lung disease.   Original Report Authenticated By: Juline Patch, M.D.    Ct Head Wo Contrast  02/23/2012  *RADIOLOGY REPORT*  Clinical Data: witnessed seizure by family, unknown length of seizure. Pt with hx of seizures, last seizure approx 2 months ago. Pt is still postictal. Pt vomiting en route  CT HEAD WITHOUT CONTRAST  Technique:  Contiguous axial images were obtained from the base of the skull through the vertex without contrast.  Comparison: 12/14/2011  Findings: Small stable remote lacunar infarcts noted in the right pons, left thalamus, and bilateral putamen.  Otherwise, the brain stem, cerebellum, cerebral peduncles, thalami, basal ganglia, basilar cisterns, and ventricular system appear unremarkable.  No intracranial hemorrhage, mass lesion, or acute infarction is identified.  Atherosclerotic calcification of the carotid siphons noted.  IMPRESSION:  1.  Stable appearance of a small pontine, left thalamic, and bilateral lentiform nucleus lacunar infarcts.  No acute intracranial findings.   Original Report Authenticated By: Dellia Cloud, M.D.     Medications: Scheduled Meds:   . apixaban  2.5 mg Oral BID  . atorvastatin  10 mg Oral QPC supper  . carvedilol  20 mg Oral Daily  . ferrous sulfate  325 mg Oral Q breakfast  . glimepiride  2 mg Oral QAC breakfast  . irbesartan  150 mg Oral Daily  . levETIRAcetam  500 mg Oral BID  . pantoprazole  40 mg Oral Q1200  . rOPINIRole  2 mg Oral QID  . sodium chloride  3 mL Intravenous Q12H  . sodium chloride  3 mL Intravenous Q12H  . DISCONTD: levETIRAcetam  500 mg Oral BID   Continuous Infusions:  PRN Meds:.sodium  chloride, sodium chloride    LOS: 1 day   Ashlyne Olenick,CHRISTOPHER  [facility] Pager [phone]. If 8PM-8AM, please contact night-coverage at [email or web address], password [facility] 02/24/2012, 8:20 AM  LOS: 1 day

## 2012-02-24 NOTE — Progress Notes (Signed)
Patient arrived to floor from emergency department per stretcher.  Family present with patient upon arrival.  Patient is alert and oriented; able to make transition from stretcher to bed.

## 2012-02-24 NOTE — Progress Notes (Signed)
[facility]  Patient Status: Active (receiving services up to time of hospitalization)  [facility] is providing the following services: PT and HHA  If patient discharges after hours, please call [phone].   Jodene Nam 02/24/2012, 3:55 PM

## 2012-02-24 NOTE — Progress Notes (Signed)
[facility] PRO[facility]ESS [facility]   HPI:   Patient awake and alert.  No further seizures.  On Keppra.  Only complaint is that of leg pain.     Past Medical History  Diagnosis Date  . Stroke   . GERD (gastroesophageal reflux disease)   . Hypertension   . Diabetes mellitus   . Blood transfusion   . Arthritis   . Anxiety   . Anemia   . GI (gastrointestinal bleed)     Due to benign mass  . Bronchitis   . Restless leg syndrome   . Atrial fibrillation   . Acute delirium 04/12/2011  . Seizures     Past Surgical History  Procedure Date  . Eye surgery     Cataract removal  . Abdominal hysterectomy   . Tubal ligation   . Eus 09/24/2011    Procedure: UPPER ENDOSCOPIC ULTRASOUND (EUS) LINEAR;  Surgeon: Theda Belfast, MD;  Location: [facility];  Service: Endoscopy;  Laterality: N/A;    Family History  Problem Relation Age of Onset  . Aneurysm Mother   . Heart attack Father   . Heart failure Sister   . Asthma Sister   . Kidney failure Sister   . Stroke Sister     Social History:  reports that she quit smoking about 26 years ago. Her smoking use included Cigarettes. She has never used smokeless tobacco. She reports that she does not drink alcohol or use illicit drugs.  Allergies  Allergen Reactions  . Erythromycin Other (See Comments)    Vaginal itching  . Penicillins Rash  . Zithromax (Azithromycin) Other (See Comments)    gave her a yeast infection.    Medications:    Prior to Admission:  Prescriptions prior to admission  Medication Sig Dispense Refill  . acetaminophen (TYLENOL) 650 MG CR tablet Take 650 mg by mouth every 8 (eight) hours as needed. For pain       . apixaban (ELIQUIS) 2.5 MG TABS tablet Take 2.5 mg by mouth 2 (two) times daily.      Marland Kitchen atorvastatin (LIPITOR) 10 MG tablet Take 10 mg by mouth daily.      . calcium-vitamin D (CALCIUM 500+D) 500-400 MG-UNIT per tablet Take 1 tablet by mouth 2 (two) times daily.      .  carvedilol (COREG CR) 20 MG 24 hr capsule Take 20 mg by mouth daily.        Marland Kitchen esomeprazole (NEXIUM) 40 MG capsule Take 40 mg by mouth daily before breakfast.        . ferrous sulfate 325 (65 FE) MG tablet Take 325 mg by mouth daily with breakfast.      . glimepiride (AMARYL) 2 MG tablet Take 2 mg by mouth daily before breakfast.      . metFORMIN (GLUCOPHAGE-XR) 750 MG 24 hr tablet Take 750 mg by mouth 2 (two) times daily.       . pregabalin (LYRICA) 75 MG capsule Take 75 mg by mouth 3 (three) times daily as needed. For restless legs and nerve pain      . rOPINIRole (REQUIP) 2 MG tablet Take 2 mg by mouth 4 (four) times daily.       Marland Kitchen telmisartan (MICARDIS) 40 MG tablet Take 40 mg by mouth 2 (two) times daily.      . VOLTAREN 1 % GEL Apply 1 application  topically 4 (four) times daily as needed. For pain       Scheduled:   . apixaban  2.5 mg Oral BID  . atorvastatin  10 mg Oral QPC supper  . carvedilol  20 mg Oral Daily  . ferrous sulfate  325 mg Oral Q breakfast  . glimepiride  2 mg Oral QAC breakfast  . insulin aspart  0-9 Units Subcutaneous TID WC  . irbesartan  150 mg Oral Daily  . levETIRAcetam  500 mg Oral BID  . pantoprazole  40 mg Oral Q1200  . rOPINIRole  2 mg Oral QID  . sodium chloride  3 mL Intravenous Q12H  . sodium chloride  3 mL Intravenous Q12H  . DISCONTD: levETIRAcetam  500 mg Oral BID    Review of Systems - General ROS: negative for - chills, fatigue, fever or hot flashes Hematological and Lymphatic ROS: negative for - bruising, fatigue, jaundice or pallor Endocrine ROS: negative for - hair pattern changes, hot flashes, mood swings or skin changes Respiratory ROS: negative for - cough, hemoptysis, orthopnea or wheezing Cardiovascular ROS: negative for - dyspnea on exertion, orthopnea, palpitations or shortness of breath Gastrointestinal ROS: negative for - abdominal pain, appetite loss, blood in stools, diarrhea or hematemesis Musculoskeletal ROS: negative for -  joint pain, joint stiffness, joint swelling or muscle pain Neurological ROS: seizure, wekness Dermatological ROS: negative for dry skin, pruritus and rash   Blood pressure 119/73, pulse 94, temperature 98.4 F (36.9 C), temperature source Oral, resp. rate 18, height 5\' 6"  (1.676 m), weight 80.1 kg (176 lb 9.4 oz), SpO2 96.00%.   Neurologic Examination:   Mental Status: Alert, oriented, thought content appropriate.  Speech dysarthric (her dentures are not in) without evidence of aphasia.  Able to follow 3 step commands without difficulty. Cranial Nerves: II: Discs flat bilaterally; Visual fields grossly normal, pupils equal, round, reactive to light and accommodation III,IV, VI: ptosis not present, extra-ocular motions intact bilaterally V,VII: smile asymmetric with left facial asymmetry (again-dentures are not in), facial light touch sensation normal bilaterally VIII: hearing normal bilaterally IX,X: gag reflex present XI: bilateral shoulder shrug XII: midline tongue extension Motor: Patient able to move all extremities against gravity.  Tone and bulk:normal tone throughout; no atrophy noted Sensory: Pinprick and light touch intact throughout, bilaterally Deep Tendon Reflexes: 2+ on the left and 1+ on the right.  AJ's absent bilaterally Plantars: Right: downgoing   Left: downgoing Cerebellar: normal finger-to-nose,  normal heel-to-shin test CV: pulses palpable throughout     Lab Results  Component Value Date/Time   CHOL 105 12/14/2011  4:45 AM    Results for orders placed during the hospital encounter of 02/23/12 (from the past 48 hour(s))  CBC     Status: Abnormal   Collection Time   02/23/12  4:57 PM      Component Value Range Comment   WBC 3.8 (*) 4.0 - 10.5 K/uL    RBC 4.51  3.87 - 5.11 MIL/uL    Hemoglobin 12.4  12.0 - 15.0 g/dL    HCT 62.1  30.8 - 65.7 %    MCV 85.8  78.0 - 100.0 fL    MCH 27.5  26.0 - 34.0 pg    MCHC 32.0  30.0 - 36.0 g/dL    RDW 84.6  96.2 -  95.2 %    Platelets 165  150 - 400 K/uL   COMPREHENSIVE METABOLIC PANEL     Status: Abnormal   Collection Time   02/23/12  4:57 PM      Component Value Range Comment   Sodium 138  135 - 145 mEq/L    Potassium 4.4  3.5 - 5.1 mEq/L    Chloride 104  96 - 112 mEq/L    CO2 24  19 - 32 mEq/L    Glucose, Bld 174 (*) 70 - 99 mg/dL    BUN 21  6 - 23 mg/dL    Creatinine, Ser 1.61  0.50 - 1.10 mg/dL    Calcium 9.8  8.4 - 09.6 mg/dL    Total Protein 6.8  6.0 - 8.3 g/dL    Albumin 3.7  3.5 - 5.2 g/dL    AST 14  0 - 37 U/L    ALT 7  0 - 35 U/L    Alkaline Phosphatase 72  39 - 117 U/L    Total Bilirubin 0.9  0.3 - 1.2 mg/dL    GFR calc non Af Amer 77 (*) >90 mL/min    GFR calc Af Amer 89 (*) >90 mL/min   TROPONIN I     Status: Normal   Collection Time   02/23/12  4:57 PM      Component Value Range Comment   Troponin I <0.30  <0.30 ng/mL   URINALYSIS, ROUTINE W REFLEX MICROSCOPIC     Status: Abnormal   Collection Time   02/23/12  5:26 PM      Component Value Range Comment   Color, Urine AMBER (*) YELLOW BIOCHEMICALS MAY BE AFFECTED BY COLOR   APPearance CLOUDY (*) CLEAR    Specific Gravity, Urine 1.026  1.005 - 1.030    pH 6.5  5.0 - 8.0    Glucose, UA 250 (*) NEGATIVE mg/dL    Hgb urine dipstick NEGATIVE  NEGATIVE    Bilirubin Urine SMALL (*) NEGATIVE    Ketones, ur NEGATIVE  NEGATIVE mg/dL    Protein, ur 045 (*) NEGATIVE mg/dL    Urobilinogen, UA 1.0  0.0 - 1.0 mg/dL    Nitrite NEGATIVE  NEGATIVE    Leukocytes, UA NEGATIVE  NEGATIVE   URINE MICROSCOPIC-ADD ON     Status: Abnormal   Collection Time   02/23/12  5:26 PM      Component Value Range Comment   Squamous Epithelial / LPF RARE  RARE    WBC, UA 0-2  <3 WBC/hpf    RBC / HPF 0-2  <3 RBC/hpf    Bacteria, UA RARE  RARE    Casts HYALINE CASTS (*) NEGATIVE AND RARE GRANULAR AND RARE CELLULAR CASTS   Urine-Other MUCOUS PRESENT     GLUCOSE, CAPILLARY     Status: Abnormal   Collection Time   02/23/12  9:58 PM      Component  Value Range Comment   Glucose-Capillary 158 (*) 70 - 99 mg/dL    Comment 1 Documented in Chart      Comment 2 Notify RN     BASIC METABOLIC PANEL     Status: Abnormal   Collection Time   02/24/12  5:50 AM      Component Value Range Comment   Sodium 137  135 - 145 mEq/L    Potassium 4.5  3.5 - 5.1 mEq/L    Chloride 104  96 - 112 mEq/L    CO2 25  19 - 32 mEq/L    Glucose, Bld 150 (*) 70 - 99 mg/dL    BUN 23  6 - 23 mg/dL    Creatinine, Ser 4.09  0.50 -  1.10 mg/dL    Calcium 9.5  8.4 - 45.4 mg/dL    GFR calc non Af Amer 66 (*) >90 mL/min    GFR calc Af Amer 77 (*) >90 mL/min   CBC     Status: Abnormal   Collection Time   02/24/12  5:50 AM      Component Value Range Comment   WBC 4.4  4.0 - 10.5 K/uL    RBC 4.18  3.87 - 5.11 MIL/uL    Hemoglobin 11.4 (*) 12.0 - 15.0 g/dL    HCT 09.8 (*) 11.9 - 46.0 %    MCV 84.9  78.0 - 100.0 fL    MCH 27.3  26.0 - 34.0 pg    MCHC 32.1  30.0 - 36.0 g/dL    RDW 14.7  82.9 - 56.2 %    Platelets 157  150 - 400 K/uL   GLUCOSE, CAPILLARY     Status: Abnormal   Collection Time   02/24/12  6:46 AM      Component Value Range Comment   Glucose-Capillary 144 (*) 70 - 99 mg/dL    Comment 1 Documented in Chart      Comment 2 Notify RN       Dg Chest 2 View  02/23/2012  *RADIOLOGY REPORT*  Clinical Data: Seizure, cough and  CHEST - 2 VIEW  Comparison: Chest x-ray of 09/23/2011  Findings: No active infiltrate or effusion is seen.  Moderate cardiomegaly is stable.  The bones are osteopenic.  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  IMPRESSION: Stable cardiomegaly.  No active lung disease.   Original Report Authenticated By: Juline Patch, M.D.    Ct Head Wo Contrast  02/23/2012  *RADIOLOGY REPORT*  Clinical Data: witnessed seizure by family, unknown length of seizure. Pt with hx of seizures, last seizure approx 2 months ago. Pt is still postictal. Pt vomiting en route  CT HEAD WITHOUT CONTRAST  Technique:  Contiguous axial images were  obtained from the base of the skull through the vertex without contrast.  Comparison: 12/14/2011  Findings: Small stable remote lacunar infarcts noted in the right pons, left thalamus, and bilateral putamen.  Otherwise, the brain stem, cerebellum, cerebral peduncles, thalami, basal ganglia, basilar cisterns, and ventricular system appear unremarkable.  No intracranial hemorrhage, mass lesion, or acute infarction is identified.  Atherosclerotic calcification of the carotid siphons noted.  IMPRESSION:  1.  Stable appearance of a small pontine, left thalamic, and bilateral lentiform nucleus lacunar infarcts.  No acute intracranial findings.   Original Report Authenticated By: Dellia Cloud, M.D.      Assessment/Plan:   Seizure   Assessment:  Patient with recurrent seizure activity.  Now on Keppra.  Tolerating well.   Plan:  Continue Keppra at 500mg  BID.  No further neurologic intervention is recommended at this time.  If further questions arise, please call or page at that time.  Thank you for allowing neurology to participate in the care of this patient.  Thana Farr, MD [facility] [phone] 02/24/2012  12:56 PM

## 2012-02-24 NOTE — Progress Notes (Signed)
EEG completed at bedside as ordered °

## 2012-02-24 NOTE — ED Provider Notes (Signed)
  I performed a history and physical examination of Elizabeth Pugh and discussed her management with Dr. Aubery Lapping.  I agree with the history, physical, assessment, and plan of care, with the following exceptions: None  I saw the ECG, relevant labs and studies - I agree with the interpretation.   Elyse Jarvis, MD 02/24/12 [phone]

## 2012-02-25 ENCOUNTER — Observation Stay (HOSPITAL_COMMUNITY): Payer: Medicare Other

## 2012-02-25 LAB — GLUCOSE, CAPILLARY: Glucose-Capillary: 128 mg/dL — ABNORMAL HIGH (ref 70–99)

## 2012-02-25 LAB — COMPREHENSIVE METABOLIC PANEL
ALT: 6 U/L (ref 0–35)
AST: 10 U/L (ref 0–37)
CO2: 26 mEq/L (ref 19–32)
Calcium: 9.4 mg/dL (ref 8.4–10.5)
Creatinine, Ser: 0.81 mg/dL (ref 0.50–1.10)
GFR calc Af Amer: 76 mL/min — ABNORMAL LOW (ref >90)
GFR calc non Af Amer: 65 mL/min — ABNORMAL LOW (ref >90)
Sodium: 139 mEq/L (ref 135–145)
Total Protein: 6 g/dL (ref 6.0–8.3)

## 2012-02-25 MED ORDER — FLUMAZENIL 0.5 MG/5ML IV SOLN
0.2000 mg | INTRAVENOUS | Status: AC
  Administered 2012-02-25: 0.2 mg via INTRAVENOUS
  Filled 2012-02-25: qty 2

## 2012-02-25 NOTE — Progress Notes (Signed)
Comment; Informed by RN, that patient is still somnolent, without improvement in mental status, despite elevated CBGs.   Suspect patient may have some difficulty metabolizing Benzodiazepine administered for brain MRI today.  Plan:  1. Flumazenil 200 mcg iv x 1 dose stat.  2. If no response, repeat head CT scan.   C. Austine Wiedeman. MD. Jerrel Ivory.

## 2012-02-25 NOTE — Progress Notes (Signed)
PT Cancellation Note  Patient Details Name: Elizabeth Pugh MRN: [id number] DOB: 30-Jul-1926   Cancelled Treatment:     Pt leaving to go down for MRI. Will f/u as time allows.   [facility] HELEN 02/25/2012, 10:49 AM Pager: [phone]

## 2012-02-25 NOTE — Significant Event (Signed)
Pt is more restless and disoriented progressively throughout the day. Follows commands and is oriented only to herself, but recognizes commands. I spoke with Dr. Brien Few and received orders to give antidote for ativan, which was given this am for MRI. Will continue to monitor.

## 2012-02-25 NOTE — Progress Notes (Signed)
[facility] PROGRESS NOTE  Elizabeth Pugh UJW:[id number] DOB: March 30, 1927 DOA: 02/23/2012 PCP: Dorrene German, MD  Assessment/Plan: Principal Problem:  *Seizure-like activity Active Problems:  DM (diabetes mellitus), type 2, uncontrolled with complications  CVA, old, hemiparesis  Hypertension  Atrial fibrillation  Restless leg syndrome    1. Seizure-like activity: Patient has a previous history of query seizure disorder, and was initially managed with Keppra. This was reportedly discontinued some months ago, as it was unclear if she was having seizures vs TIAs. Dr Onalee Hua provided initial neurology consultation, and patient was subsequently seen by Dr Thad Ranger, neurologist for input and recommendations. Recommendation was to continue with Keppra, 500 mg b.i.d. No further seizures have been recorded. Brain MRI was a sub-optimal study, but showed no evidence of acute pathology. EEG was a normal study. Patient's family feel that patient will benefit from placement for rehab. Awaiting PT/OT recommendations.  2. Hypertension: Controlled on pre-admission antihypertensives.  3. Atrial fibrillation: Rate-controlled on Beta-blocker. Will need to review anticoagulant therapy, in view of fall risk with seizures. I did discuss with daughter, Bard Herbert by phone on 1022/13, and she feels that patient is a at low fall risk, as she does not currently ambulate a lot. Will defer to PMD.  4. Restless leg syndrome: Stable/Not problematic.  5. DM (diabetes mellitus), type 2: CBGs have been reasonable since admission. Manage with diet/SSI. Will likely discontinue Metformin, secondary to age.    Code Status: Full Code.  Family Communication:  Disposition Plan: Aiming possible discharge on 02/26/12.    Brief narrative: 78 yo female with history of previous CVA, DM-2, HTN, atrial fibrillation, GERD, and anxiety, who was noted on 02/23/12, after eating lunch to become unresponsive and having  jerking movements of her upper extremities for less than a minute and had drooling during the episode. Patient came to in less than a minute, was very confused and her confusion gradually improved back to her baseline mental status in one to two hours. No recent acute illness. Pt was previously diagnosed with seizure disorder but then was taken off her keppra a few months ago, as it was unclear if she was really having seizures or TIAs. She was admitted for further management.    Consultants:  Neurology.  Procedures:  CXR.  Head CT scan.   Antibiotics:  N/A.   HPI/Subjective: No new issues.   Objective: Vital signs in last 24 hours: Temp:  [97.5 F (36.4 C)-99.2 F (37.3 C)] 97.5 F (36.4 C) (10/24 1026) Pulse Rate:  [80-89] 84  (10/24 1026) Resp:  [16-18] 18  (10/24 1026) BP: (95-151)/(46-89) 115/65 mmHg (10/24 1026) SpO2:  [97 %-98 %] 98 % (10/24 1026) Weight change:     Intake/Output from previous day:       Physical Exam: General: Comfortable, alert, albeit somewhat confused after sedation for MRI, communicative, not short of breath at rest.  HEENT:  No clinical pallor, no jaundice, no conjunctival injection or discharge. Hydration status is fair.  NECK:  Supple, JVP not seen, no carotid bruits, no palpable lymphadenopathy, no palpable goiter. CHEST:  Clinically clear to auscultation, no wheezes, no crackles. HEART:  Sounds 1 and 2 heard, normal, irregular, no murmurs. ABDOMEN:  Full, soft, non-tender, no palpable organomegaly, no palpable masses, normal bowel sounds. GENITALIA:  Not examined. LOWER EXTREMITIES:  No pitting edema, palpable peripheral pulses. MUSCULOSKELETAL SYSTEM:  Generalized osteoarthritic changes, otherwise, normal. CENTRAL NERVOUS SYSTEM:  No focal neurologic deficit on gross examination.  Lab Results:  [facility] 02/24/12  0550 02/23/12 1657  WBC 4.4 3.8*  HGB 11.4* 12.4  HCT 35.5* 38.7  PLT 157 165    Basename 02/25/12 0555 02/24/12  0550  NA 139 137  K 4.5 4.5  CL 105 104  CO2 26 25  GLUCOSE 134* 150*  BUN 22 23  CREATININE 0.81 0.80  CALCIUM 9.4 9.5   Recent Results (from the past 240 hour(s))  URINE CULTURE     Status: Normal   Collection Time   02/23/12  5:26 PM      Component Value Range Status Comment   Specimen Description URINE, CATHETERIZED   Final    Special Requests NONE   Final    Culture  Setup Time 02/23/2012 18:50   Final    Colony Count NO GROWTH   Final    Culture NO GROWTH   Final    Report Status 02/24/2012 FINAL   Final      Studies/Results: Dg Chest 2 View  02/23/2012  *RADIOLOGY REPORT*  Clinical Data: Seizure, cough and  CHEST - 2 VIEW  Comparison: Chest x-ray of 09/23/2011  Findings: No active infiltrate or effusion is seen.  Moderate cardiomegaly is stable.  The bones are osteopenic.  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  IMPRESSION: Stable cardiomegaly.  No active lung disease.   Original Report Authenticated By: Juline Patch, M.D.    Ct Head Wo Contrast  02/23/2012  *RADIOLOGY REPORT*  Clinical Data: witnessed seizure by family, unknown length of seizure. Pt with hx of seizures, last seizure approx 2 months ago. Pt is still postictal. Pt vomiting en route  CT HEAD WITHOUT CONTRAST  Technique:  Contiguous axial images were obtained from the base of the skull through the vertex without contrast.  Comparison: 12/14/2011  Findings: Small stable remote lacunar infarcts noted in the right pons, left thalamus, and bilateral putamen.  Otherwise, the brain stem, cerebellum, cerebral peduncles, thalami, basal ganglia, basilar cisterns, and ventricular system appear unremarkable.  No intracranial hemorrhage, mass lesion, or acute infarction is identified.  Atherosclerotic calcification of the carotid siphons noted.  IMPRESSION:  1.  Stable appearance of a small pontine, left thalamic, and bilateral lentiform nucleus lacunar infarcts.  No acute intracranial findings.   Original  Report Authenticated By: Dellia Cloud, M.D.    Mr Brain Wo Contrast  02/25/2012  *RADIOLOGY REPORT*  Clinical Data: Seizure.  MRI HEAD WITHOUT CONTRAST  Technique:  Multiplanar, multiecho pulse sequences of the brain and surrounding structures were obtained according to standard protocol without intravenous contrast.  Comparison: CT 02/23/2012.  MRI 12/14/2011  Findings: Incomplete study.  The patient was moving extensively and was not able to complete the study.  Images obtained are significantly degraded by motion.  Diffusion weighted imaging reveals no large territory acute infarct.  A small infarct could be missed.  Extensive chronic ischemia is present in the thalami bilaterally and in the right pons.  This is unchanged from the  prior MRI.  No hydrocephalus or mass.  IMPRESSION: Incomplete study.  The patient was  moving extensively and could not complete the study.  There is  chronic ischemic change in the thalami and right pons. No definite acute abnormality.  Repeat study suggested.   Original Report Authenticated By: Camelia Phenes, M.D.     Medications: Scheduled Meds:    . apixaban  2.5 mg Oral BID  . atorvastatin  10 mg Oral QPC supper  . carvedilol  20 mg Oral Daily  .  ferrous sulfate  325 mg Oral Q breakfast  . glimepiride  2 mg Oral QAC breakfast  . insulin aspart  0-9 Units Subcutaneous TID WC  . irbesartan  150 mg Oral Daily  . levETIRAcetam  500 mg Oral BID  . LORazepam  0.5 mg Intravenous Once  . pantoprazole  40 mg Oral Q1200  . rOPINIRole  2 mg Oral QID  . sodium chloride  3 mL Intravenous Q12H  . sodium chloride  3 mL Intravenous Q12H   Continuous Infusions:  PRN Meds:.sodium chloride, sodium chloride    LOS: 2 days   Gearldine Looney,CHRISTOPHER  [facility] Pager [phone]. If 8PM-8AM, please contact night-coverage at [email or web address], password [facility] 02/25/2012, 2:53 PM  LOS: 2 days

## 2012-02-25 NOTE — Procedures (Signed)
REFERRING PHYSICIAN:  Isidor Holts, M.D.  HISTORY:  An 78 year old female with seizure.  MEDICATIONS:  Tylenol, Eliquis, Calcium, Coreg, Nexium, Amaryl, metformin, Lyrica, Requip, Micardis, Voltaren,  Keppra, insulin.  CONDITIONS OF RECORDING:  This is a 16-channel EEG carried out with the patient in the awake and drowsy states.  DESCRIPTION:  The waking background activity consists of a low-voltage, symmetrical, fairly well-organized 8-9 Hz alpha activity seen from the parieto-occipital and posterotemporal region.  Low voltage, fast activity, poorly organized is seen and during at times, superimposed on more posterior rhythms.  A mixture of theta and alpha rhythm was seen from the central and temporal regions.  The awake background rhythm is not well sustained.  Often drowse is noted to interrupt the awake background activity with slowing noted with the predominance of theta and delta rhythms.  Stage II sleep was not obtained.  Hyperventilation was not performed.  Intermittent photic stimulation failed to elicit any change in the tracing.  IMPRESSION:  This is a normal EEG.          ______________________________ Thana Farr, MD    HY:QMVH D:  02/25/2012 07:27:53  T:  02/25/2012 09:09:05  Job #:  [id number]

## 2012-02-26 LAB — CBC
Hemoglobin: 11.9 g/dL — ABNORMAL LOW (ref 12.0–15.0)
MCH: 27.5 pg (ref 26.0–34.0)
MCHC: 32.1 g/dL (ref 30.0–36.0)
MCV: 85.7 fL (ref 78.0–100.0)
RBC: 4.33 MIL/uL (ref 3.87–5.11)

## 2012-02-26 LAB — BASIC METABOLIC PANEL
CO2: 25 mEq/L (ref 19–32)
Calcium: 9.4 mg/dL (ref 8.4–10.5)
Creatinine, Ser: 0.74 mg/dL (ref 0.50–1.10)
GFR calc non Af Amer: 77 mL/min — ABNORMAL LOW (ref >90)
Glucose, Bld: 135 mg/dL — ABNORMAL HIGH (ref 70–99)

## 2012-02-26 NOTE — Progress Notes (Signed)
[facility] PROGRESS NOTE  Elizabeth Pugh ZOX:[id number] DOB: 03/21/1927 DOA: 02/23/2012 PCP: Dorrene German, MD  Assessment/Plan: Principal Problem:  *Seizure-like activity Active Problems:  DM (diabetes mellitus), type 2, uncontrolled with complications  CVA, old, hemiparesis  Hypertension  Atrial fibrillation  Restless leg syndrome    1. Seizure-like activity: Patient has a previous history of query seizure disorder, and was initially managed with Keppra. This was reportedly discontinued some months ago, as it was unclear if she was having seizures vs TIAs. Dr Onalee Hua provided initial neurology consultation, and patient was subsequently seen by Dr Thad Ranger, neurologist for input and recommendations. Recommendation was to continue with Keppra, 500 mg b.i.d. No further seizures have been recorded. Brain MRI was a sub-optimal study, but showed no evidence of acute pathology. EEG was a normal study. She did have a transient episode of hypersomnolence on 02/25/12, with mildly elevated CBG, attributable to Ativan administered pre-MRI, and possible secondary to slow metabolization. She responded dramatically to a single dose of Flumazenil. Patient's family feel that patient will benefit from placement for rehab, but she was declined for CIR, and is likely to be discharged home with HHPT/Aide, unless she qualifies for SNF. Medically stable. She will follow up with neurology.  2. Hypertension: Controlled on pre-admission antihypertensives.  3. Atrial fibrillation: Rate-controlled on Beta-blocker. Will need to review anticoagulant therapy, in view of fall risk with seizures. I did discuss with daughter, Bard Herbert by phone on 1022/13, and she feels that patient is a at low fall risk, as she does not currently ambulate a lot. Will defer to PMD.  4. Restless leg syndrome: Stable/Not problematic.  5. DM (diabetes mellitus), type 2: CBGs have been reasonable since admission. Managed with  diet/SSI. Have discontinued Metformin, secondary to age. Amaryl will be continued on discharge.    Code Status: Full Code.  Family Communication:  Disposition Plan: Aiming possible discharge on 02/26/12.    Brief narrative: 78 yo female with history of previous CVA, DM-2, HTN, atrial fibrillation, GERD, and anxiety, who was noted on 02/23/12, after eating lunch to become unresponsive and having jerking movements of her upper extremities for less than a minute and had drooling during the episode. Patient came to in less than a minute, was very confused and her confusion gradually improved back to her baseline mental status in one to two hours. No recent acute illness. Pt was previously diagnosed with seizure disorder but then was taken off her keppra a few months ago, as it was unclear if she was really having seizures or TIAs. She was admitted for further management.    Consultants:  Neurology.  Procedures:  CXR.  Head CT scan.   Antibiotics:  N/A.   HPI/Subjective: Asymptomatic.   Objective: Vital signs in last 24 hours: Temp:  [97.4 F (36.3 C)-98.1 F (36.7 C)] 97.4 F (36.3 C) (10/25 0531) Pulse Rate:  [87-97] 91  (10/25 0531) Resp:  [18-20] 20  (10/25 0531) BP: (109-143)/(62-90) 132/90 mmHg (10/25 0531) SpO2:  [97 %-100 %] 97 % (10/25 0531) Weight change:  Last BM Date: 02/23/12  Intake/Output from previous day:   Total I/O In: 393 [P.O.:390; I.V.:3] Out: -    Physical Exam: General: Comfortable, alert, communicative, not short of breath at rest.  HEENT:  No clinical pallor, no jaundice, no conjunctival injection or discharge. Hydration status is fair.  NECK:  Supple, JVP not seen, no carotid bruits, no palpable lymphadenopathy, no palpable goiter. CHEST:  Clinically clear to auscultation, no wheezes, no  crackles. HEART:  Sounds 1 and 2 heard, normal, irregular, no murmurs. ABDOMEN:  Full, soft, non-tender, no palpable organomegaly, no palpable masses,  normal bowel sounds. GENITALIA:  Not examined. LOWER EXTREMITIES:  No pitting edema, palpable peripheral pulses. MUSCULOSKELETAL SYSTEM:  Generalized osteoarthritic changes, otherwise, normal. CENTRAL NERVOUS SYSTEM:  No focal neurologic deficit on gross examination.  Lab Results:  Basename 02/26/12 0550 02/24/12 0550  WBC 3.4* 4.4  HGB 11.9* 11.4*  HCT 37.1 35.5*  PLT 161 157    Basename 02/26/12 0550 02/25/12 0555  NA 134* 139  K 4.2 4.5  CL 102 105  CO2 25 26  GLUCOSE 135* 134*  BUN 19 22  CREATININE 0.74 0.81  CALCIUM 9.4 9.4   Recent Results (from the past 240 hour(s))  URINE CULTURE     Status: Normal   Collection Time   02/23/12  5:26 PM      Component Value Range Status Comment   Specimen Description URINE, CATHETERIZED   Final    Special Requests NONE   Final    Culture  Setup Time 02/23/2012 18:50   Final    Colony Count NO GROWTH   Final    Culture NO GROWTH   Final    Report Status 02/24/2012 FINAL   Final      Studies/Results: Ct Head Wo Contrast  02/25/2012  *RADIOLOGY REPORT*  Clinical Data: Altered mental status.  Seizure-like activity.  CT HEAD WITHOUT CONTRAST  Technique:  Contiguous axial images were obtained from the base of the skull through the vertex without contrast.  Comparison: MRI of the brain without contrast 02/25/2012.  CT of the head without contrast 02/23/2012.  Findings: The study is markedly degraded by patient motion. Additional attempts at imaging were performed.  Remote to lacunar infarcts of the brain stem and left thalamus are stable.  A remote infarct of the left lentiform nucleus is again noted.  No acute cortical infarct, hemorrhage, or mass lesion is present.  The ventricles are of normal size.  No significant extra- axial fluid collection is present.  The paranasal sinuses and mastoid air cells are clear. Atherosclerotic calcifications are again noted in the cavernous carotid arteries bilaterally.  The osseous skull is intact.   IMPRESSION:  1.  No acute intracranial abnormality or significant interval change. 2.  The study is moderately degraded by patient motion. 3.  Stable remote lacunar infarcts of the brain stem, left thalamus, and left lentiform nucleus. 4.  Atherosclerosis.   Original Report Authenticated By: Jamesetta Orleans. MATTERN, M.D.    Mr Brain Wo Contrast  02/25/2012  *RADIOLOGY REPORT*  Clinical Data: Seizure.  MRI HEAD WITHOUT CONTRAST  Technique:  Multiplanar, multiecho pulse sequences of the brain and surrounding structures were obtained according to standard protocol without intravenous contrast.  Comparison: CT 02/23/2012.  MRI 12/14/2011  Findings: Incomplete study.  The patient was moving extensively and was not able to complete the study.  Images obtained are significantly degraded by motion.  Diffusion weighted imaging reveals no large territory acute infarct.  A small infarct could be missed.  Extensive chronic ischemia is present in the thalami bilaterally and in the right pons.  This is unchanged from the  prior MRI.  No hydrocephalus or mass.  IMPRESSION: Incomplete study.  The patient was  moving extensively and could not complete the study.  There is  chronic ischemic change in the thalami and right pons. No definite acute abnormality.  Repeat study suggested.   Original Report Authenticated  By: Camelia Phenes, M.D.     Medications: Scheduled Meds:    . apixaban  2.5 mg Oral BID  . atorvastatin  10 mg Oral QPC supper  . carvedilol  20 mg Oral Daily  . ferrous sulfate  325 mg Oral Q breakfast  . flumazenil  0.2 mg Intravenous STAT  . glimepiride  2 mg Oral QAC breakfast  . insulin aspart  0-9 Units Subcutaneous TID WC  . irbesartan  150 mg Oral Daily  . levETIRAcetam  500 mg Oral BID  . pantoprazole  40 mg Oral Q1200  . rOPINIRole  2 mg Oral QID  . sodium chloride  3 mL Intravenous Q12H  . sodium chloride  3 mL Intravenous Q12H   Continuous Infusions:  PRN Meds:.sodium chloride, sodium  chloride    LOS: 3 days   Paula Busenbark,CHRISTOPHER  [facility] Pager [phone]. If 8PM-8AM, please contact night-coverage at [email or web address], password [facility] 02/26/2012, 2:51 PM  LOS: 3 days

## 2012-02-26 NOTE — Evaluation (Signed)
Physical Therapy Evaluation (continued from 02/24/12) Patient Details Name: Elizabeth Pugh MRN: [id number] DOB: 21-Mar-1927 Today's Date: 02/26/2012 Time: 4098-1191 PT Time Calculation (min): 21 min  PT Assessment / Plan / Recommendation Clinical Impression  78 yo adm with ?seizure, with AMS and slowly progressing with activity. Pt reports her daughter needs to have knee surgery and is not able to help pt as much as before. Pt agreeablet to post-acute therapy.    PT Assessment  Patient needs continued PT services    Follow Up Recommendations  Post acute inpatient    Does the patient have the potential to tolerate intense rehabilitation   Yes, Recommend IP Rehab Screening  Barriers to Discharge Other (comment) unknown ability for daughter to assist    Equipment Recommendations  None recommended by PT    Recommendations for Other Services Rehab consult   Frequency Min 3X/week    Precautions / Restrictions Precautions Precautions: Fall Restrictions Weight Bearing Restrictions: No   Pertinent Vitals/Pain Denied pain      Mobility  Bed Mobility Bed Mobility: Supine to Sit;Sitting - Scoot to Edge of Bed Supine to Sit: 6: Modified independent (Device/Increase time);HOB elevated (HOB 10) Sitting - Scoot to Edge of Bed: 6: Modified independent (Device/Increase time) Details for Bed Mobility Assistance: Pt sits upright to long-sitting position in the bed and then takes her legs over EOB; requires incr time to scoot to EOB, and is able to do it without assistance Transfers Transfers: Sit to Stand;Stand to Sit Sit to Stand: 5: Supervision;With upper extremity assist;From bed;With armrests;From chair/3-in-1 Stand to Sit: 5: Supervision;With upper extremity assist;With armrests;To chair/3-in-1 Details for Transfer Assistance: Pt moving better today. Supervision for safety--requires incr time/effort Ambulation/Gait Ambulation/Gait Assistance: 4: Min assist Ambulation Distance  (Feet): 10 Feet Assistive device: Rolling walker Ambulation/Gait Assistance Details: Pt leads with Lt foot and shuffles/slides Rt foot up meet Lt foot (does not pass left foot); trunk flexed Gait Pattern: Step-to pattern;Decreased stance time - left;Decreased stride length;Trunk flexed    Shoulder Instructions     Exercises     PT Diagnosis: Difficulty walking;Altered mental status  PT Problem List: Decreased strength;Decreased range of motion;Decreased activity tolerance;Decreased balance;Decreased mobility;Decreased knowledge of use of DME;Pain PT Treatment Interventions: DME instruction;Gait training;Stair training;Functional mobility training;Therapeutic activities;Balance training;Cognitive remediation;Patient/family education   PT Goals Acute Rehab PT Goals PT Goal Formulation: With patient Time For Goal Achievement: 03/04/12 Pt will go Sit to Stand: with min assist;with upper extremity assist PT Goal: Sit to Stand - Progress: Partly met Pt will go Stand to Sit: with supervision;with upper extremity assist PT Goal: Stand to Sit - Progress: Progressing toward goal Pt will Ambulate: 16 - 50 feet;with min assist;with least restrictive assistive device PT Goal: Ambulate - Progress: Progressing toward goal Pt will Go Up / Down Stairs: 3-5 stairs;with min assist;with rail(s) PT Goal: Up/Down Stairs - Progress: Goal set today  Visit Information  Last PT Received On: 02/26/12 Assistance Needed: +2    Subjective Data  Patient Stated Goal: wants to go home with her daughter to stay with her   Prior Functioning  Home Living Lives With: Daughter Available Help at Discharge: Family;Other (Comment) (availability unknown as family not present on eval) Type of Home: House Home Access: Stairs to enter [employer] of Steps: 5 Entrance Stairs-Rails: Right;Left Home Layout: One level Home Adaptive Equipment: Walker - four wheeled    Cognition  Overall Cognitive Status:  Impaired Area of Impairment: Awareness of deficits Arousal/Alertness: Awake/alert Orientation Level: Other (comment) (  Ox3 (time not tested)) Behavior During Session: [facility] for tasks performed Awareness of Deficits: Pt feels she could climb 5 steps into home in her current state    Extremity/Trunk Assessment     Balance Static Sitting Balance Static Sitting - Balance Support: Bilateral upper extremity supported;Feet supported Static Sitting - Level of Assistance: 6: Modified independent (Device/Increase time) Static Standing Balance Static Standing - Balance Support: Bilateral upper extremity supported Static Standing - Level of Assistance: 4: Min assist Static Standing - Comment/# of Minutes: flexed posture  End of Session PT - End of Session Equipment Utilized During Treatment: Gait belt Activity Tolerance: Patient limited by fatigue Patient left: in chair;with chair alarm set;with call bell/phone within reach Nurse Communication: Mobility status  GP     Xavier Munger 02/26/2012, 10:42 AM  Pager [phone]

## 2012-02-26 NOTE — Progress Notes (Signed)
Rehab Admissions Coordinator Note:  Patient was screened by Elizabeth Pugh for appropriateness for an Inpatient Acute Rehab Consult. I spoke with patient at bedside and then contacted her daughter, Geralyn Flash by phone. Daughter At this time is requesting SNF rehab. She feels patient needs a longer rehab recovery before returning home with her. She has functional limitations to be able to care for her Mom at this level. States she discussed with Dr. Brien Few last PM. I will alert SW of the need for SNF placement at this time.   Elizabeth Dupes, RN 02/26/2012, 12:04 PM  I can be reached at [phone].

## 2012-02-26 NOTE — Discharge Summary (Signed)
Physician Discharge Summary  Elizabeth Pugh:[id number] DOB: [birth year] DOA: 02/23/2012  PCP: Dorrene German, MD  Admit date: 02/23/2012 Discharge date: 02/27/2012  Time spent: 40 minutes  Recommendations for Outpatient Follow-up:  1. Follow up with primary MD. 2. Follow up with Dr Pearlean Brownie, neurologist. 3. HHPT/Aide.    Discharge Diagnoses:  Principal Problem:  *Seizure-like activity Active Problems:  DM (diabetes mellitus), type 2, uncontrolled with complications  CVA, old, hemiparesis  Hypertension  Atrial fibrillation  Restless leg syndrome   Discharge Condition: Satisfactory.  Diet recommendation: Heart-Healthy/Carbohydrate-Modified.   Filed Weights   02/23/12 2103  Weight: 80.1 kg (176 lb 9.4 oz)    History of present illness:  78 yo female with history of previous CVA, DM-2, HTN, atrial fibrillation, GERD, and anxiety, who was noted on 02/23/12, after eating lunch to become unresponsive and having jerking movements of her upper extremities for less than a minute and had drooling during the episode. Patient came to in less than a minute, was very confused and her confusion gradually improved back to her baseline mental status in one to two hours. No recent acute illness. Pt was previously diagnosed with seizure disorder but then was taken off her keppra a few months ago, as it was unclear if she was really having seizures or TIAs. She was admitted for further management.    Hospital Course:  1. Seizure-like activity: Patient has a previous history of query seizure disorder, and was initially managed with Keppra. This was reportedly discontinued some months ago, as it was unclear if she was having seizures vs TIAs. Dr Onalee Hua provided initial neurology consultation, and patient was subsequently seen by Dr Thad Ranger, neurologist for input and recommendations. Recommendation was to continue with Keppra, 500 mg b.i.d. This was implemented accordingly, and no  further seizures were recorded during this hospitalization. Brain MRI was a sub-optimal study, but showed no evidence of acute pathology. EEG was a normal study. She did have a transient episode of hypersomnolence on 02/25/12, with mildly elevated CBG, attributable to Ativan administered pre-MRI, and possibly secondary to slow metabolization. She responded dramatically to a single dose of Flumazenil. Patient will follow up with neurology on discharge.  2. Hypertension: Controlled on pre-admission antihypertensives.  3. Atrial fibrillation: Rate-controlled on Beta-blocker. Will need to review anticoagulant therapy, in view of fall risk with seizures. I did discuss with daughter, Bard Herbert by phone on 1022/13, and she feels that patient is a at low fall risk, as she does not currently ambulate a lot. Decision has been deferred to PMD.  4. Restless leg syndrome: Stable/Not problematic.  5. DM (diabetes mellitus), type 2: CBGs were stable and reasonable throughout hospitalization. Managed with diet/SSI. Have discontinued Metformin, secondary to age. Amaryl monotherapy will be continued on discharge.    Procedures:  See below.   Consultations:  Dr Onalee Hua.   Discharge Exam: Filed Vitals:   02/26/12 0531 02/26/12 1400 02/26/12 2200 02/27/12 0600  BP: 132/90 111/65 145/78 159/88  Pulse: 91 85 70 72  Temp: 97.4 F (36.3 C) 98 F (36.7 C) 97.6 F (36.4 C) 97.4 F (36.3 C)  TempSrc: Oral Oral Oral Oral  Resp: 20 18 18 20   Height:      Weight:      SpO2: 97% 98% 99% 100%    General: Comfortable, alert, communicative, not short of breath at rest.  HEENT: No clinical pallor, no jaundice, no conjunctival injection or discharge. Hydration status is fair.  NECK: Supple, JVP not seen, no  carotid bruits, no palpable lymphadenopathy, no palpable goiter.  CHEST: Clinically clear to auscultation, no wheezes, no crackles.  HEART: Sounds 1 and 2 heard, normal, irregular, no murmurs.  ABDOMEN:  Full, soft, non-tender, no palpable organomegaly, no palpable masses, normal bowel sounds.  GENITALIA: Not examined.  LOWER EXTREMITIES: No pitting edema, palpable peripheral pulses.  MUSCULOSKELETAL SYSTEM: Generalized osteoarthritic changes, otherwise, normal.  CENTRAL NERVOUS SYSTEM: No focal neurologic deficit on gross examination.  Discharge Instructions      Discharge Orders    Future Orders Please Complete By Expires   Diet - low sodium heart healthy      Diet Carb Modified      Home Health      Questions: Responses:   To provide the following care/treatments PT    Home Health Aide   Face-to-face encounter      Comments:   I Hannan Hutmacher,CHRISTOPHER certify that this patient is under my care and that I, or a nurse practitioner or physician's assistant working with me, had a face-to-face encounter that meets the physician face-to-face encounter requirements with this patient on 02/27/2012.   Questions: Responses:   The encounter with the patient was in whole, or in part, for the following medical condition, which is the primary reason for home health care Serizure disorder/DM/HTN/Weakness.   I certify that, based on my findings, the following services are medically necessary home health services Physical therapy   My clinical findings support the need for the above services Complex treatment plan/patient with lack knowledge disease process and treatment   Further, I certify that my clinical findings support that this patient is homebound due to: Unable to leave home safely without assistance   To provide the following care/treatments PT    Home Health Aide   Increase activity slowly          Medication List     As of 02/27/2012  1:42 PM    STOP taking these medications         metFORMIN 750 MG 24 hr tablet   Commonly known as: GLUCOPHAGE-XR      TAKE these medications         acetaminophen 650 MG CR tablet   Commonly known as: TYLENOL   Take 650 mg by mouth every 8 (eight)  hours as needed. For pain      apixaban 2.5 MG Tabs tablet   Commonly known as: ELIQUIS   Take 2.5 mg by mouth 2 (two) times daily.      atorvastatin 10 MG tablet   Commonly known as: LIPITOR   Take 10 mg by mouth daily.      Calcium 500+D 500-400 MG-UNIT per tablet   Generic drug: calcium-vitamin D   Take 1 tablet by mouth 2 (two) times daily.      carvedilol 20 MG 24 hr capsule   Commonly known as: COREG CR   Take 20 mg by mouth daily.      esomeprazole 40 MG capsule   Commonly known as: NEXIUM   Take 40 mg by mouth daily before breakfast.      ferrous sulfate 325 (65 FE) MG tablet   Take 325 mg by mouth daily with breakfast.      glimepiride 2 MG tablet   Commonly known as: AMARYL   Take 2 mg by mouth daily before breakfast.      levETIRAcetam 500 MG tablet   Commonly known as: KEPPRA   Take 1 tablet (500 mg total) by mouth  2 (two) times daily.      pregabalin 75 MG capsule   Commonly known as: LYRICA   Take 75 mg by mouth 3 (three) times daily as needed. For restless legs and nerve pain      rOPINIRole 2 MG tablet   Commonly known as: REQUIP   Take 2 mg by mouth 4 (four) times daily.      telmisartan 40 MG tablet   Commonly known as: MICARDIS   Take 40 mg by mouth 2 (two) times daily.      VOLTAREN 1 % Gel   Generic drug: diclofenac sodium   Apply 1 application topically 4 (four) times daily as needed. For pain         Follow-up Information    Follow up with Dorrene German, MD. (As needed)    Contact information:   [address] [place] [address] [phone]       Schedule an appointment as soon as possible for a visit with Gates Rigg, MD.   Contact information:   [address] [facility] [place] [address] [phone]           The results of significant diagnostics from this hospitalization (including imaging, microbiology, ancillary and laboratory) are listed below for reference.     Significant Diagnostic Studies: Dg Chest 2 View  02/23/2012  *RADIOLOGY REPORT*  Clinical Data: Seizure, cough and  CHEST - 2 VIEW  Comparison: Chest x-ray of 09/23/2011  Findings: No active infiltrate or effusion is seen.  Moderate cardiomegaly is stable.  The bones are osteopenic.  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  IMPRESSION: Stable cardiomegaly.  No active lung disease.   Original Report Authenticated By: Juline Patch, M.D.    Ct Head Wo Contrast  02/25/2012  *RADIOLOGY REPORT*  Clinical Data: Altered mental status.  Seizure-like activity.  CT HEAD WITHOUT CONTRAST  Technique:  Contiguous axial images were obtained from the base of the skull through the vertex without contrast.  Comparison: MRI of the brain without contrast 02/25/2012.  CT of the head without contrast 02/23/2012.  Findings: The study is markedly degraded by patient motion. Additional attempts at imaging were performed.  Remote to lacunar infarcts of the brain stem and left thalamus are stable.  A remote infarct of the left lentiform nucleus is again noted.  No acute cortical infarct, hemorrhage, or mass lesion is present.  The ventricles are of normal size.  No significant extra- axial fluid collection is present.  The paranasal sinuses and mastoid air cells are clear. Atherosclerotic calcifications are again noted in the cavernous carotid arteries bilaterally.  The osseous skull is intact.  IMPRESSION:  1.  No acute intracranial abnormality or significant interval change. 2.  The study is moderately degraded by patient motion. 3.  Stable remote lacunar infarcts of the brain stem, left thalamus, and left lentiform nucleus. 4.  Atherosclerosis.   Original Report Authenticated By: Jamesetta Orleans. MATTERN, M.D.    Ct Head Wo Contrast  02/23/2012  *RADIOLOGY REPORT*  Clinical Data: witnessed seizure by family, unknown length of seizure. Pt with hx of seizures, last seizure approx 2 months ago. Pt is still  postictal. Pt vomiting en route  CT HEAD WITHOUT CONTRAST  Technique:  Contiguous axial images were obtained from the base of the skull through the vertex without contrast.  Comparison: 12/14/2011  Findings: Small stable remote lacunar infarcts noted in the right pons, left thalamus, and bilateral putamen.  Otherwise, the brain stem, cerebellum, cerebral peduncles, thalami, basal ganglia, basilar cisterns, and ventricular system appear unremarkable.  No intracranial hemorrhage, mass lesion, or acute infarction is identified.  Atherosclerotic calcification of the carotid siphons noted.  IMPRESSION:  1.  Stable appearance of a small pontine, left thalamic, and bilateral lentiform nucleus lacunar infarcts.  No acute intracranial findings.   Original Report Authenticated By: Dellia Cloud, M.D.    Mr Brain Wo Contrast  02/25/2012  *RADIOLOGY REPORT*  Clinical Data: Seizure.  MRI HEAD WITHOUT CONTRAST  Technique:  Multiplanar, multiecho pulse sequences of the brain and surrounding structures were obtained according to standard protocol without intravenous contrast.  Comparison: CT 02/23/2012.  MRI 12/14/2011  Findings: Incomplete study.  The patient was moving extensively and was not able to complete the study.  Images obtained are significantly degraded by motion.  Diffusion weighted imaging reveals no large territory acute infarct.  A small infarct could be missed.  Extensive chronic ischemia is present in the thalami bilaterally and in the right pons.  This is unchanged from the  prior MRI.  No hydrocephalus or mass.  IMPRESSION: Incomplete study.  The patient was  moving extensively and could not complete the study.  There is  chronic ischemic change in the thalami and right pons. No definite acute abnormality.  Repeat study suggested.   Original Report Authenticated By: Camelia Phenes, M.D.     Microbiology: Recent Results (from the past 240 hour(s))  URINE CULTURE     Status: Normal   Collection  Time   02/23/12  5:26 PM      Component Value Range Status Comment   Specimen Description URINE, CATHETERIZED   Final    Special Requests NONE   Final    Culture  Setup Time 02/23/2012 18:50   Final    Colony Count NO GROWTH   Final    Culture NO GROWTH   Final    Report Status 02/24/2012 FINAL   Final      Labs: Basic Metabolic Panel:  Lab 02/26/12 1610 02/25/12 0555 02/24/12 0550 02/23/12 1657  NA 134* 139 137 138  K 4.2 4.5 4.5 4.4  CL 102 105 104 104  CO2 25 26 25 24   GLUCOSE 135* 134* 150* 174*  BUN 19 22 23 21   CREATININE 0.74 0.81 0.80 0.73  CALCIUM 9.4 9.4 9.5 9.8  MG -- -- -- --  PHOS -- -- -- --   Liver Function Tests:  Lab 02/25/12 0555 02/23/12 1657  AST 10 14  ALT 6 7  ALKPHOS 60 72  BILITOT 0.8 0.9  PROT 6.0 6.8  ALBUMIN 3.2* 3.7   No results found for this basename: LIPASE:5,AMYLASE:5 in the last 168 hours No results found for this basename: AMMONIA:5 in the last 168 hours CBC:  Lab 02/26/12 0550 02/24/12 0550 02/23/12 1657  WBC 3.4* 4.4 3.8*  NEUTROABS -- -- --  HGB 11.9* 11.4* 12.4  HCT 37.1 35.5* 38.7  MCV 85.7 84.9 85.8  PLT 161 157 165   Cardiac Enzymes:  Lab 02/23/12 1657  CKTOTAL --  CKMB --  CKMBINDEX --  TROPONINI <0.30   BNP: BNP (last 3 results)  Basename 09/27/11 1240 09/24/11 0335 09/23/11 0540  PROBNP 911.5* 1414.0* 1665.0*   CBG:  Lab 02/27/12 1225 02/27/12 0639 02/26/12 2215 02/26/12 1632 02/26/12 1128  GLUCAP 145* 153* 169* 130* 159*       Signed:  Graceson Nichelson,CHRISTOPHER  [facility] 02/27/2012, 1:42 PM

## 2012-02-27 LAB — GLUCOSE, CAPILLARY
Glucose-Capillary: 169 mg/dL — ABNORMAL HIGH (ref 70–99)
Glucose-Capillary: 153 mg/dL — ABNORMAL HIGH (ref 70–99)
Glucose-Capillary: 145 mg/dL — ABNORMAL HIGH (ref 70–99)

## 2012-02-27 MED ORDER — LEVETIRACETAM 500 MG PO TABS: 500.0000 mg | ORAL_TABLET | Freq: Two times a day (BID) | ORAL | Status: AC

## 2012-02-27 NOTE — Progress Notes (Signed)
CARE MANAGEMENT NOTE 02/27/2012  Patient:  Elizabeth Pugh, Elizabeth Pugh   Account Number:  [id number]  Date Initiated:  02/24/2012  Documentation initiated by:  [facility]  Subjective/Objective Assessment:   Admitted with possible seizure or TIA.     Action/Plan:   PT eval   Anticipated DC Date:  02/27/2012   Anticipated DC Plan:  HOME W HOME HEALTH SERVICES      DC Planning Services  CM consult      [facility] Choice  HOME HEALTH   Choice offered to / List presented to:  C-1 Patient        HH arranged  HH-2 PT  HH-3 OT      [facility] agency  [facility]   Status of service:  Completed, signed off Medicare Important Message given?   (If response is "NO", the following Medicare IM given date fields will be blank) Date Medicare IM given:   Date Additional Medicare IM given:    Discharge Disposition:  HOME W HOME HEALTH SERVICES  Per UR Regulation:  Reviewed for med. necessity/level of care/duration of stay  If discussed at Long Length of Stay Meetings, dates discussed:    Comments:

## 2012-02-27 NOTE — Progress Notes (Signed)
Pt iv dc intact. Pt sent home by ambulance. Dc papers placed in yellow folder along with rx for patient. Pt under no s/s distress.

## 2012-02-27 NOTE — Clinical Social Work Psychosocial (Signed)
     Clinical Social Work Department BRIEF PSYCHOSOCIAL ASSESSMENT 02/27/2012  Patient:  Elizabeth Pugh, Elizabeth Pugh     Account Number:  [id number]     Admit date:  02/23/2012  Clinical Social Worker:  Peggyann Shoals  Date/Time:  02/26/2012 04:30 PM  Referred by:  Physician  Date Referred:  02/25/2012 Referred for  SNF Placement   Other Referral:   Interview type:  Family Other interview type:    PSYCHOSOCIAL DATA Living Status:  FAMILY Admitted from facility:   Level of care:   Primary support name:  Elizabeth Pugh/daughter Primary support relationship to patient:  CHILD, ADULT Degree of support available:   Supportive    CURRENT CONCERNS Current Concerns  None Noted   Other Concerns:    SOCIAL WORK ASSESSMENT / PLAN CSW met with pt and pt's daught to address consult. CSW introduced herself and explained role of social work. CSW also explained the process of discharging to a SNF with Medicare. Pt has not had a 3 night qualifying stay in the hospital, therefore will not Medicare will not cover a SNF stay. CSW offered placement as a private pay, pt's daughter declined and shared that the family could not afford it. CSW also offered Private Duty Care agency list, which was declined. CSW provided support around discharge planing.    Per RNCM, pt is set up with [facility]. Pt lives at home with pt's daughter.    CSW is signing off at this time. Please call if a need arise prior to discharge.   Assessment/plan status:  No Further Intervention Required Other assessment/ plan:   Information/referral to community resources:   Declined    PATIENTS/FAMILYS RESPONSE TO PLAN OF CARE: Pt was alert and oriented. Pt and Pt's daughter are now agreeable to discharge plan.

## 2012-08-05 ENCOUNTER — Other Ambulatory Visit (HOSPITAL_COMMUNITY): Payer: Self-pay | Admitting: Internal Medicine

## 2012-08-05 ENCOUNTER — Ambulatory Visit (HOSPITAL_COMMUNITY)
Admission: RE | Admit: 2012-08-05 | Discharge: 2012-08-05 | Disposition: A | Discharge status: Home / Self Care | Payer: Medicare Other | Source: Ambulatory Visit | Attending: Internal Medicine | Admitting: Internal Medicine

## 2012-08-05 DIAGNOSIS — J209 Acute bronchitis, unspecified: Secondary | ICD-10-CM | POA: Insufficient documentation

## 2013-05-09 ENCOUNTER — Encounter (HOSPITAL_COMMUNITY): Payer: Self-pay | Admitting: Emergency Medicine

## 2013-05-09 ENCOUNTER — Emergency Department (HOSPITAL_COMMUNITY)
Admission: EM | Admit: 2013-05-09 | Discharge: 2013-05-09 | Disposition: A | Discharge status: Home / Self Care | Payer: Medicare Other | Attending: Emergency Medicine | Admitting: Emergency Medicine

## 2013-05-09 ENCOUNTER — Emergency Department (HOSPITAL_COMMUNITY): Payer: Medicare Other

## 2013-05-09 DIAGNOSIS — Y9389 Activity, other specified: Secondary | ICD-10-CM | POA: Insufficient documentation

## 2013-05-09 DIAGNOSIS — Y929 Unspecified place or not applicable: Secondary | ICD-10-CM | POA: Insufficient documentation

## 2013-05-09 DIAGNOSIS — S8253XA Displaced fracture of medial malleolus of unspecified tibia, initial encounter for closed fracture: Secondary | ICD-10-CM | POA: Insufficient documentation

## 2013-05-09 DIAGNOSIS — S8252XA Displaced fracture of medial malleolus of left tibia, initial encounter for closed fracture: Secondary | ICD-10-CM

## 2013-05-09 DIAGNOSIS — L299 Pruritus, unspecified: Secondary | ICD-10-CM | POA: Insufficient documentation

## 2013-05-09 DIAGNOSIS — G2581 Restless legs syndrome: Secondary | ICD-10-CM | POA: Insufficient documentation

## 2013-05-09 DIAGNOSIS — K219 Gastro-esophageal reflux disease without esophagitis: Secondary | ICD-10-CM | POA: Insufficient documentation

## 2013-05-09 DIAGNOSIS — I10 Essential (primary) hypertension: Secondary | ICD-10-CM | POA: Insufficient documentation

## 2013-05-09 DIAGNOSIS — M129 Arthropathy, unspecified: Secondary | ICD-10-CM | POA: Insufficient documentation

## 2013-05-09 DIAGNOSIS — R300 Dysuria: Secondary | ICD-10-CM | POA: Insufficient documentation

## 2013-05-09 DIAGNOSIS — Z79899 Other long term (current) drug therapy: Secondary | ICD-10-CM | POA: Insufficient documentation

## 2013-05-09 DIAGNOSIS — Z7902 Long term (current) use of antithrombotics/antiplatelets: Secondary | ICD-10-CM | POA: Insufficient documentation

## 2013-05-09 DIAGNOSIS — D649 Anemia, unspecified: Secondary | ICD-10-CM | POA: Insufficient documentation

## 2013-05-09 DIAGNOSIS — Z8673 Personal history of transient ischemic attack (TIA), and cerebral infarction without residual deficits: Secondary | ICD-10-CM | POA: Insufficient documentation

## 2013-05-09 DIAGNOSIS — I4891 Unspecified atrial fibrillation: Secondary | ICD-10-CM | POA: Insufficient documentation

## 2013-05-09 DIAGNOSIS — Z880 Allergy status to penicillin: Secondary | ICD-10-CM | POA: Insufficient documentation

## 2013-05-09 DIAGNOSIS — E119 Type 2 diabetes mellitus without complications: Secondary | ICD-10-CM | POA: Insufficient documentation

## 2013-05-09 DIAGNOSIS — F411 Generalized anxiety disorder: Secondary | ICD-10-CM | POA: Insufficient documentation

## 2013-05-09 DIAGNOSIS — Z8709 Personal history of other diseases of the respiratory system: Secondary | ICD-10-CM | POA: Insufficient documentation

## 2013-05-09 DIAGNOSIS — X500XXA Overexertion from strenuous movement or load, initial encounter: Secondary | ICD-10-CM | POA: Insufficient documentation

## 2013-05-09 DIAGNOSIS — G40909 Epilepsy, unspecified, not intractable, without status epilepticus: Secondary | ICD-10-CM | POA: Insufficient documentation

## 2013-05-09 LAB — COMPREHENSIVE METABOLIC PANEL
ALBUMIN: 3.4 g/dL — AB (ref 3.5–5.2)
ALT: 9 U/L (ref 0–35)
AST: 15 U/L (ref 0–37)
Alkaline Phosphatase: 111 U/L (ref 39–117)
BUN: 24 mg/dL — AB (ref 6–23)
CO2: 26 mEq/L (ref 19–32)
CREATININE: 0.65 mg/dL (ref 0.50–1.10)
Calcium: 9.1 mg/dL (ref 8.4–10.5)
Chloride: 103 mEq/L (ref 96–112)
GFR calc Af Amer: >90 mL/min (ref >90)
GFR calc non Af Amer: 79 mL/min — ABNORMAL LOW (ref >90)
Glucose, Bld: 252 mg/dL — ABNORMAL HIGH (ref 70–99)
Potassium: 4.7 mEq/L (ref 3.7–5.3)
Sodium: 139 mEq/L (ref 137–147)
TOTAL PROTEIN: 6.5 g/dL (ref 6.0–8.3)
Total Bilirubin: 0.7 mg/dL (ref 0.3–1.2)

## 2013-05-09 LAB — CBC WITH DIFFERENTIAL/PLATELET
BASOS PCT: 1 % (ref 0–1)
Basophils Absolute: 0 10*3/uL (ref 0.0–0.1)
EOS ABS: 0.1 10*3/uL (ref 0.0–0.7)
EOS PCT: 2 % (ref 0–5)
HEMATOCRIT: 38.3 % (ref 36.0–46.0)
HEMOGLOBIN: 12.3 g/dL (ref 12.0–15.0)
Lymphocytes Relative: 37 % (ref 12–46)
Lymphs Abs: 1.4 10*3/uL (ref 0.7–4.0)
MCH: 29.9 pg (ref 26.0–34.0)
MCHC: 32.1 g/dL (ref 30.0–36.0)
MCV: 93.2 fL (ref 78.0–100.0)
MONO ABS: 0.3 10*3/uL (ref 0.1–1.0)
MONOS PCT: 7 % (ref 3–12)
Neutro Abs: 2 10*3/uL (ref 1.7–7.7)
Neutrophils Relative %: 53 % (ref 43–77)
Platelets: 187 10*3/uL (ref 150–400)
RBC: 4.11 MIL/uL (ref 3.87–5.11)
RDW: 14 % (ref 11.5–15.5)
WBC: 3.8 10*3/uL — ABNORMAL LOW (ref 4.0–10.5)

## 2013-05-09 LAB — URINALYSIS, ROUTINE W REFLEX MICROSCOPIC
Bilirubin Urine: NEGATIVE
GLUCOSE, UA: 250 mg/dL — AB
Hgb urine dipstick: NEGATIVE
Ketones, ur: NEGATIVE mg/dL
LEUKOCYTES UA: NEGATIVE
NITRITE: NEGATIVE
PH: 7.5 (ref 5.0–8.0)
PROTEIN: NEGATIVE mg/dL
Specific Gravity, Urine: 1.015 (ref 1.005–1.030)
Urobilinogen, UA: 1 mg/dL (ref 0.0–1.0)

## 2013-05-09 MED ORDER — HYDROXYZINE HCL 25 MG PO TABS
25.0000 mg | ORAL_TABLET | Freq: Four times a day (QID) | ORAL | Status: DC
Start: 2013-05-09 — End: 2013-08-28

## 2013-05-09 MED ORDER — DIPHENHYDRAMINE HCL 50 MG/ML IJ SOLN
INTRAMUSCULAR | Status: AC
  Administered 2013-05-09: 25 mg
  Filled 2013-05-09: qty 1

## 2013-05-09 NOTE — ED Notes (Signed)
Patient transported to X-ray 

## 2013-05-09 NOTE — Discharge Instructions (Signed)
Ankle Fracture A fracture is a break in the bone. A cast or splint is used to protect and keep your injured bone from moving.  HOME CARE INSTRUCTIONS   Use your crutches as directed.  To lessen the swelling, keep the injured leg elevated while sitting or lying down.  Apply ice to the injury for 15-20 minutes, 03-04 times per day while awake for 2 days. Put the ice in a plastic bag and place a thin towel between the bag of ice and your cast.  If you have a plaster or fiberglass cast:  Do not try to scratch the skin under the cast using sharp or pointed objects.  Check the skin around the cast every day. You may put lotion on any red or sore areas.  Keep your cast dry and clean.  If you have a plaster splint:  Wear the splint as directed.  You may loosen the elastic around the splint if your toes become numb, tingle, or turn cold or blue.  Do not put pressure on any part of your cast or splint; it may break. Rest your cast only on a pillow the first 24 hours until it is fully hardened.  Your cast or splint can be protected during bathing with a plastic bag. Do not lower the cast or splint into water.  Take medications as directed by your caregiver. Only take over-the-counter or prescription medicines for pain, discomfort, or fever as directed by your caregiver.  Do not drive a vehicle until your caregiver specifically tells you it is safe to do so.  If your caregiver has given you a follow-up appointment, it is very important to keep that appointment. Not keeping the appointment could result in a chronic or permanent injury, pain, and disability. If there is any problem keeping the appointment, you must call back to this facility for assistance. SEEK IMMEDIATE MEDICAL CARE IF:   Your cast gets damaged or breaks.  You have continued severe pain or more swelling than you did before the cast was put on.  Your skin or toenails below the injury turn blue or gray, or feel cold or  numb.  There is a bad smell or new stains and/or purulent (pus like) drainage coming from under the cast. If you do not have a window in your cast for observing the wound, a discharge or minor bleeding may show up as a stain on the outside of your cast. Report these findings to your caregiver. MAKE SURE YOU:   Understand these instructions.  Will watch your condition.  Will get help right away if you are not doing well or get worse. Document Released: 04/17/2000 Document Revised: 07/13/2011 Document Reviewed: 11/22/2007 [facility] Patient Information 2014 [place]. Pruritus  Pruritis is an itch. There are many different problems that can cause an itch. Dry skin is one of the most common causes of itching. Most cases of itching do not require medical attention.  HOME CARE INSTRUCTIONS  Make sure your skin is moistened on a regular basis. A moisturizer that contains petroleum jelly is best for keeping moisture in your skin. If you develop a rash, you may try the following for relief:   Use corticosteroid cream.  Apply cool compresses to the affected areas.  Bathe with Epsom salts or baking soda in the bathwater.  Soak in colloidal oatmeal baths. These are available at your pharmacy.  Apply baking soda paste to the rash. Stir water into baking soda until it reaches a paste-like consistency.  Use an anti-itch lotion.  Take over-the-counter diphenhydramine medicine by mouth as the instructions direct.  Avoid scratching. Scratching may cause the rash to become infected. If itching is very bad, your caregiver may suggest prescription lotions or creams to lessen your symptoms.  Avoid hot showers, which can make itching worse. A cold shower may help with itching as long as you use a moisturizer after the shower. SEEK MEDICAL CARE IF: The itching does not go away after several days. Document Released: 12/31/2010 Document Revised: 07/13/2011 Document Reviewed: 12/31/2010 [facility]  Patient Information 2014 [place], [place].
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
## 2013-05-09 NOTE — Progress Notes (Signed)
Orthopedic Tech Progress Note Patient Details:  Elizabeth Pugh Jul 28, 1926 [id number] Ace wrap applied to left ankle Ortho Devices Type of Ortho Device: Ace wrap Ortho Device/Splint Location: Left LE Ortho Device/Splint Interventions: Application   Asia R Thompson 05/09/2013, 9:07 PM

## 2013-05-09 NOTE — ED Notes (Signed)
Bed: [facility] Expected date:  Expected time:  Means of arrival:  Comments: 80 y/o itching

## 2013-05-09 NOTE — ED Notes (Signed)
Pt c/o itching on her back for the past four days.

## 2013-05-10 LAB — URINE CULTURE
CULTURE: NO GROWTH
Colony Count: NO GROWTH

## 2013-08-28 ENCOUNTER — Encounter (HOSPITAL_COMMUNITY): Payer: Self-pay | Admitting: Emergency Medicine

## 2013-08-28 ENCOUNTER — Emergency Department (HOSPITAL_COMMUNITY)
Admission: EM | Admit: 2013-08-28 | Discharge: 2013-08-28 | Disposition: A | Discharge status: Home / Self Care | Payer: Medicare Other | Attending: Emergency Medicine | Admitting: Emergency Medicine

## 2013-08-28 DIAGNOSIS — E1142 Type 2 diabetes mellitus with diabetic polyneuropathy: Secondary | ICD-10-CM | POA: Insufficient documentation

## 2013-08-28 DIAGNOSIS — D649 Anemia, unspecified: Secondary | ICD-10-CM | POA: Insufficient documentation

## 2013-08-28 DIAGNOSIS — Z880 Allergy status to penicillin: Secondary | ICD-10-CM | POA: Insufficient documentation

## 2013-08-28 DIAGNOSIS — M129 Arthropathy, unspecified: Secondary | ICD-10-CM | POA: Insufficient documentation

## 2013-08-28 DIAGNOSIS — G40909 Epilepsy, unspecified, not intractable, without status epilepticus: Secondary | ICD-10-CM | POA: Insufficient documentation

## 2013-08-28 DIAGNOSIS — X131XXA Other contact with steam and other hot vapors, initial encounter: Secondary | ICD-10-CM

## 2013-08-28 DIAGNOSIS — T25221A Burn of second degree of right foot, initial encounter: Secondary | ICD-10-CM

## 2013-08-28 DIAGNOSIS — Z8673 Personal history of transient ischemic attack (TIA), and cerebral infarction without residual deficits: Secondary | ICD-10-CM | POA: Insufficient documentation

## 2013-08-28 DIAGNOSIS — Z79899 Other long term (current) drug therapy: Secondary | ICD-10-CM | POA: Insufficient documentation

## 2013-08-28 DIAGNOSIS — E1149 Type 2 diabetes mellitus with other diabetic neurological complication: Secondary | ICD-10-CM | POA: Insufficient documentation

## 2013-08-28 DIAGNOSIS — Z8709 Personal history of other diseases of the respiratory system: Secondary | ICD-10-CM | POA: Insufficient documentation

## 2013-08-28 DIAGNOSIS — I10 Essential (primary) hypertension: Secondary | ICD-10-CM | POA: Insufficient documentation

## 2013-08-28 DIAGNOSIS — G2581 Restless legs syndrome: Secondary | ICD-10-CM | POA: Insufficient documentation

## 2013-08-28 DIAGNOSIS — X12XXXA Contact with other hot fluids, initial encounter: Secondary | ICD-10-CM | POA: Insufficient documentation

## 2013-08-28 DIAGNOSIS — T25229A Burn of second degree of unspecified foot, initial encounter: Secondary | ICD-10-CM | POA: Insufficient documentation

## 2013-08-28 DIAGNOSIS — I4891 Unspecified atrial fibrillation: Secondary | ICD-10-CM | POA: Insufficient documentation

## 2013-08-28 DIAGNOSIS — T25222A Burn of second degree of left foot, initial encounter: Secondary | ICD-10-CM

## 2013-08-28 DIAGNOSIS — Z87891 Personal history of nicotine dependence: Secondary | ICD-10-CM | POA: Insufficient documentation

## 2013-08-28 DIAGNOSIS — K219 Gastro-esophageal reflux disease without esophagitis: Secondary | ICD-10-CM | POA: Insufficient documentation

## 2013-08-28 DIAGNOSIS — F411 Generalized anxiety disorder: Secondary | ICD-10-CM | POA: Insufficient documentation

## 2013-08-28 DIAGNOSIS — Y9389 Activity, other specified: Secondary | ICD-10-CM | POA: Insufficient documentation

## 2013-08-28 DIAGNOSIS — Z7901 Long term (current) use of anticoagulants: Secondary | ICD-10-CM | POA: Insufficient documentation

## 2013-08-28 DIAGNOSIS — Y92009 Unspecified place in unspecified non-institutional (private) residence as the place of occurrence of the external cause: Secondary | ICD-10-CM | POA: Insufficient documentation

## 2013-08-28 LAB — CBG MONITORING, ED: Glucose-Capillary: 182 mg/dL — ABNORMAL HIGH (ref 70–99)

## 2013-08-28 MED ORDER — HYDROCODONE-ACETAMINOPHEN 5-325 MG PO TABS: 1.0000 | ORAL_TABLET | ORAL | Status: DC | PRN

## 2013-08-28 MED ORDER — OXYCODONE-ACETAMINOPHEN 5-325 MG PO TABS
1.0000 | ORAL_TABLET | Freq: Once | ORAL | Status: AC
  Administered 2013-08-28: 1 via ORAL
  Filled 2013-08-28: qty 1

## 2013-08-28 MED ORDER — DOCUSATE SODIUM 100 MG PO CAPS: 100.0000 mg | ORAL_CAPSULE | Freq: Two times a day (BID) | ORAL | Status: DC

## 2013-08-28 MED ORDER — SILVER SULFADIAZINE 1 % EX CREA: 1.0000 "application " | TOPICAL_CREAM | Freq: Every day | CUTANEOUS | Status: DC

## 2013-08-28 MED ORDER — SILVER SULFADIAZINE 1 % EX CREA
TOPICAL_CREAM | Freq: Once | CUTANEOUS | Status: AC
Start: 2013-08-28 — End: 2013-08-28
  Administered 2013-08-28: 18:00:00 via TOPICAL
  Filled 2013-08-28: qty 50
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
## 2013-08-28 NOTE — Progress Notes (Signed)
08/28/2013 A. Christen Bame RNCM [id number] [facility] faxed home health orders with face to face to [facility] at [phone] at 2140pm with confirmation of receipt at 2147pm.  [facility] informed patient and patient's daughter earlier inshift that home health agency has 24-48 hours to contact patient.  EDCM provided phone number to [place] to patient's daughter.  No further EDCM needs at this time.
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
## 2013-08-28 NOTE — Discharge Instructions (Signed)
Burn Care Your skin is a natural barrier to infection. It is the largest organ of your body. Burns damage this natural protection. To help prevent infection, it is very important to follow your caregiver's instructions in the care of your burn. Burns are classified as:  First degree. There is only redness of the skin (erythema). No scarring is expected.  Second degree. There is blistering of the skin. Scarring may occur with deeper burns.  Third degree. All layers of the skin are injured, and scarring is expected. HOME CARE INSTRUCTIONS   Wash your hands well before changing your bandage.  Change your bandage as often as directed by your caregiver.  Remove the old bandage. If the bandage sticks, you may soak it off with cool, clean water.  Cleanse the burn thoroughly but gently with mild soap and water.  Pat the area dry with a clean, dry cloth.  Apply a thin layer of antibacterial cream to the burn.  Apply a clean bandage as instructed by your caregiver.  Keep the bandage as clean and dry as possible.  Elevate the affected area for the first 24 hours, then as instructed by your caregiver.  Only take over-the-counter or prescription medicines for pain, discomfort, or fever as directed by your caregiver. SEEK IMMEDIATE MEDICAL CARE IF:   You develop excessive pain.  You develop redness, tenderness, swelling, or red streaks near the burn.  The burned area develops yellowish-white fluid (pus) or a bad smell.  You have a fever. MAKE SURE YOU:   Understand these instructions.  Will watch your condition.  Will get help right away if you are not doing well or get worse. Document Released: 04/20/2005 Document Revised: 07/13/2011 Document Reviewed: 09/10/2010 [facility] Patient Information 2014 [place], [place].  Second-Degree Burn A second-degree burn affects the 2 outer layers of skin. The outer layer (epidermis) and the layer underneath it (dermis) are both burned. Another  name for this type of burn is a partial thickness burn. A second-degree burn may be called minor or major. This depends on the size of the burn. It also depends on what parts of the skin are burned. Minor burns may be treated with first aid. Major burns are a medical emergency. A second-degree burn is worse than a first-degree burn, but not as bad as a third-degree burn. A first-degree burn affects only the epidermis. A third-degree burn goes through all the layers of skin. A second-degree burn usually heals in 3 to 4 weeks. A minor second-degree burn usually does not leave a scar.Deeper second-degree burns may lead to scarring of the skin or contractures over joints.Contractures are scars that form over joints and may lead to reduced mobility at those joints. CAUSES  Heat (thermal) injury. This happens when skin comes in contact with something very hot. It could be a flame, a hot object, hot liquid, or steam. Most second-degree burns are thermal injuries.  Radiation. Sunlight is one type of radiation that can burn the skin. Another type of radiation is used to heat food. Radiation is also used to treat some diseases, such as cancer. All types of radiation can burn the skin. Sunlight usually causes a first-degree burn. Radiation used for heating food or treating a disease can cause a second-degree burn.  Electricity. Electrical burns can cause more damage under the skin than on the surface. They should always be treated as major burns.  Chemicals. Many chemicals can burn the skin. The burn should be flushed with cool water and checked  by an emergency caregiver. SYMPTOMS Symptoms of second-degree burns include:  Severe pain.  Extreme tenderness.  Deep redness.  Blistered skin.  Skin that has changed color.It might look blotchy, wet, or shiny.  Swelling. TREATMENT Some second-degree burns may need to be treated in a hospital. These include major burns, electrical burns, and chemical burns.  Many other second-degree burns can be treated with regular first aid, such as:  Cooling the burn. Use cool, germ-free (sterile) salt water. Place the burned area of skin into a tub of water, or cover the burned area with clean, wet towels.  Taking pain medicine.  Removing the dead skin from broken blisters. A trained caregiver may do this. Do not pop blisters.  Gently washing your skin with mild soap.  Covering the burned area with a cream.Silver sulfadiazine is a cream for burns. An antibiotic cream, such as bacitracin, may also be used to fight infection. Do not use other ointments or creams unless your caregiver says it is okay.  Protecting the burn with a sterile, non-sticky bandage.  Bandaging fingers and toes separately. This keeps them from sticking together.  Taking an antibiotic. This can help prevent infection.  Getting a tetanus shot. HOME CARE INSTRUCTIONS Medication  Take any medicine prescribed by your caregiver. Follow the directions carefully.  Ask your caregiver if you can take over-the-counter medicine to relieve pain and swelling. Do not give aspirin to children.  Make sure your caregiver knows about all other medicines you take.This includes over-the-counter medicines. Burn care  You will need to change the bandage on your burn. You may need to do this 2 or 3 times each day.  Gently clean the burned area.  Put ointment on it.  Cover the burn with a sterile bandage.  For some deeper burns or burns that cover a large area, compression garments may be prescribed. These garments can help minimize scarring and protect your mobility.  Do not put butter or oil on your skin. Use only the cream prescribed by your caregiver.  Do not put ice on your burn.  Do not break blisters on your skin.  Keep the bandaged area dry. You might need to take a sponge bath for awhile.Ask your caregiver when you can take a shower or a tub bath again.  Do not scratch an itchy  burn. Your caregiver may give you medicine to relieve very bad itching.  Infection is a big danger after a second-degree burn. Tell your caregiver right away if you have signs of infection, such as:  Redness or changing color in the burned area.  Fluid leaking from the burn.  Swelling in the burn area.  A bad smell coming from the wound. Follow-up  Keep all follow-up appointments.This is important. This is how your caregiver can tell if your treatment is working.  Protect your burn from sunlight.Use sunscreen whenever you go outside.Burned areas may be sensitive to the sun for up to 1 year. Exposure to the sun may also cause permanent darkening of scars. SEEK MEDICAL CARE IF:  You have any questions about medicines.  You have any questions about your treatment.  You wonder if it is okay to do a particular activity.  You develop a fever of more than 100.5 F (38.1 C). SEEK IMMEDIATE MEDICAL CARE IF:  You think your burn might be infected. It may change color, become red, leak fluid, swell, or smell bad.  You develop a fever of more than 102 F (38.9 C). Document Released:  09/22/2010 Document Revised: 07/13/2011 Document Reviewed: 09/22/2010 [facility] Patient Information 2014 [place], [place].    You may clean your wounds with warm soap and water once a day. Please that your wounds dry and then apply Silvadene. Please cover these areas with gauze and wrap. If you develop worsening pain it hasn't controlled her pain medication, drainage of pus from your wounds, redness or streaks up her legs, fever, any other symptoms concerning to you, please return to the emergency department.
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
## 2014-10-05 ENCOUNTER — Encounter (HOSPITAL_COMMUNITY): Payer: Self-pay | Admitting: *Deleted

## 2014-10-05 ENCOUNTER — Emergency Department (HOSPITAL_COMMUNITY)
Admission: EM | Admit: 2014-10-05 | Discharge: 2014-10-06 | Disposition: A | Discharge status: Home / Self Care | Payer: Medicare Other | Attending: Emergency Medicine | Admitting: Emergency Medicine

## 2014-10-05 ENCOUNTER — Emergency Department (HOSPITAL_COMMUNITY): Payer: Medicare Other

## 2014-10-05 DIAGNOSIS — G40909 Epilepsy, unspecified, not intractable, without status epilepticus: Secondary | ICD-10-CM | POA: Diagnosis not present

## 2014-10-05 DIAGNOSIS — D649 Anemia, unspecified: Secondary | ICD-10-CM | POA: Diagnosis not present

## 2014-10-05 DIAGNOSIS — I4891 Unspecified atrial fibrillation: Secondary | ICD-10-CM | POA: Diagnosis not present

## 2014-10-05 DIAGNOSIS — Z79899 Other long term (current) drug therapy: Secondary | ICD-10-CM | POA: Diagnosis not present

## 2014-10-05 DIAGNOSIS — M1990 Unspecified osteoarthritis, unspecified site: Secondary | ICD-10-CM | POA: Diagnosis not present

## 2014-10-05 DIAGNOSIS — F419 Anxiety disorder, unspecified: Secondary | ICD-10-CM | POA: Diagnosis not present

## 2014-10-05 DIAGNOSIS — R51 Headache: Secondary | ICD-10-CM | POA: Diagnosis not present

## 2014-10-05 DIAGNOSIS — Z87891 Personal history of nicotine dependence: Secondary | ICD-10-CM | POA: Insufficient documentation

## 2014-10-05 DIAGNOSIS — M791 Myalgia: Secondary | ICD-10-CM | POA: Insufficient documentation

## 2014-10-05 DIAGNOSIS — R0602 Shortness of breath: Secondary | ICD-10-CM

## 2014-10-05 DIAGNOSIS — J90 Pleural effusion, not elsewhere classified: Secondary | ICD-10-CM | POA: Insufficient documentation

## 2014-10-05 DIAGNOSIS — I10 Essential (primary) hypertension: Secondary | ICD-10-CM | POA: Diagnosis not present

## 2014-10-05 DIAGNOSIS — M2550 Pain in unspecified joint: Secondary | ICD-10-CM | POA: Diagnosis not present

## 2014-10-05 DIAGNOSIS — E119 Type 2 diabetes mellitus without complications: Secondary | ICD-10-CM | POA: Diagnosis not present

## 2014-10-05 DIAGNOSIS — Z8673 Personal history of transient ischemic attack (TIA), and cerebral infarction without residual deficits: Secondary | ICD-10-CM | POA: Insufficient documentation

## 2014-10-05 DIAGNOSIS — Z880 Allergy status to penicillin: Secondary | ICD-10-CM | POA: Diagnosis not present

## 2014-10-05 DIAGNOSIS — M7989 Other specified soft tissue disorders: Secondary | ICD-10-CM | POA: Diagnosis not present

## 2014-10-05 DIAGNOSIS — K219 Gastro-esophageal reflux disease without esophagitis: Secondary | ICD-10-CM | POA: Diagnosis not present

## 2014-10-05 DIAGNOSIS — I509 Heart failure, unspecified: Secondary | ICD-10-CM | POA: Insufficient documentation

## 2014-10-05 DIAGNOSIS — G2581 Restless legs syndrome: Secondary | ICD-10-CM | POA: Diagnosis not present

## 2014-10-05 HISTORY — DX: Heart failure, unspecified: I50.9

## 2014-10-05 LAB — BASIC METABOLIC PANEL
Anion gap: 6 (ref 5–15)
BUN: 19 mg/dL (ref 6–20)
CALCIUM: 9.2 mg/dL (ref 8.9–10.3)
CHLORIDE: 103 mmol/L (ref 101–111)
CO2: 31 mmol/L (ref 22–32)
Creatinine, Ser: 0.64 mg/dL (ref 0.44–1.00)
GFR calc Af Amer: >60 mL/min (ref >60)
GFR calc non Af Amer: >60 mL/min (ref >60)
Glucose, Bld: 306 mg/dL — ABNORMAL HIGH (ref 65–99)
POTASSIUM: 4.8 mmol/L (ref 3.5–5.1)
Sodium: 140 mmol/L (ref 135–145)

## 2014-10-05 LAB — CBC WITH DIFFERENTIAL/PLATELET
BASOS ABS: 0 10*3/uL (ref 0.0–0.1)
BASOS PCT: 0 % (ref 0–1)
Eosinophils Absolute: 0.1 10*3/uL (ref 0.0–0.7)
Eosinophils Relative: 2 % (ref 0–5)
HCT: 41.3 % (ref 36.0–46.0)
HEMOGLOBIN: 12.9 g/dL (ref 12.0–15.0)
LYMPHS PCT: 19 % (ref 12–46)
Lymphs Abs: 0.8 10*3/uL (ref 0.7–4.0)
MCH: 29.3 pg (ref 26.0–34.0)
MCHC: 31.2 g/dL (ref 30.0–36.0)
MCV: 93.7 fL (ref 78.0–100.0)
Monocytes Absolute: 0.3 10*3/uL (ref 0.1–1.0)
Monocytes Relative: 7 % (ref 3–12)
NEUTROS PCT: 72 % (ref 43–77)
Neutro Abs: 2.8 10*3/uL (ref 1.7–7.7)
Platelets: 133 10*3/uL — ABNORMAL LOW (ref 150–400)
RBC: 4.41 MIL/uL (ref 3.87–5.11)
RDW: 13.6 % (ref 11.5–15.5)
WBC: 3.9 10*3/uL — ABNORMAL LOW (ref 4.0–10.5)

## 2014-10-05 LAB — I-STAT TROPONIN, ED: TROPONIN I, POC: 0 ng/mL (ref 0.00–0.08)

## 2014-10-05 NOTE — ED Notes (Signed)
O2 saturation was assessed. O2sat 90 % on RA, @ LT of O2 applied

## 2014-10-05 NOTE — ED Provider Notes (Signed)
CSN: [id number]     Arrival date & time 10/05/14  2123 History   First MD Initiated Contact with Patient 10/05/14 2232     Chief Complaint  Patient presents with  . Shortness of Breath  . Rash    arms     (Consider location/radiation/quality/duration/timing/severity/associated sxs/prior Treatment) HPI Comments: CC: Swelling LLE  HPI: Ms. Elizabeth Pugh is a 81 year old morbidly obese non-ambulatory female who presents to the ED with a one day history of swelling in the LLE.  Patient is bed bound and cared for at home by daughter.  Patient's daughter noticed her left lower leg was swollen earlier in the day as well as a mild erythematous pruritic rash bilaterally on patient's upper arms, which prompted her to call EMS.  Patient has a history of type II diabetes with peripheral neuropathy, CHF with prior history of bilateral swelling of lower extremities, stroke, Afib, hypertension, and bronchitis.  Daughter also reports that patient has had an increase in frequency of shortness of breath over the past three months requiring an increase in use of albuterol nebulizer; patient does not use home O2.  Patient has chronic cough with frequent clear sputum production, but has had no increase in frequency or severity of cough.  Patient reports no fevers, sweats, or chills.  Currently taking Eliquis for Afib/stroke prevention.   Past Medical History  Diagnosis Date  . Stroke   . GERD (gastroesophageal reflux disease)   . Hypertension   . Diabetes mellitus   . Blood transfusion   . Arthritis   . Anxiety   . Anemia   . GI (gastrointestinal bleed)     Due to benign mass  . Bronchitis   . Restless leg syndrome   . Atrial fibrillation   . Acute delirium 04/12/2011  . Seizures   . CHF (congestive heart failure)    Past Surgical History  Procedure Laterality Date  . Eye surgery      Cataract removal  . Abdominal hysterectomy    . Tubal ligation    . Eus  09/24/2011    Procedure: UPPER ENDOSCOPIC  ULTRASOUND (EUS) LINEAR;  Surgeon: Beryle Beams, MD;  Location: [facility] ENDOSCOPY;  Service: Endoscopy;  Laterality: N/A;   Family History  Problem Relation Age of Onset  . Aneurysm Mother   . Heart attack Father   . Heart failure Sister   . Asthma Sister   . Kidney failure Sister   . Stroke Sister    History  Substance Use Topics  . Smoking status: Former Smoker    Types: Cigarettes    Quit date: 09/21/1985  . Smokeless tobacco: Never Used  . Alcohol Use: No   OB History    No data available     Review of Systems  Respiratory: Positive for cough and shortness of breath.   Cardiovascular: Positive for leg swelling.  Musculoskeletal: Positive for myalgias and arthralgias.  All other systems reviewed and are negative.     Allergies  Erythromycin; Penicillins; and Zithromax  Home Medications   Prior to Admission medications   Medication Sig Start Date End Date Taking? Authorizing Provider  albuterol (PROVENTIL HFA;VENTOLIN HFA) 108 (90 BASE) MCG/ACT inhaler Inhale 1 puff into the lungs every 6 (six) hours as needed for wheezing or shortness of breath.   Yes Historical Provider, MD  amLODipine (NORVASC) 5 MG tablet Take 5 mg by mouth daily.   Yes Historical Provider, MD  apixaban (ELIQUIS) 2.5 MG TABS tablet Take 2.5 mg  by mouth 2 (two) times daily.   Yes Historical Provider, MD  calcium-vitamin D (CALCIUM 500+D) 500-400 MG-UNIT per tablet Take 1 tablet by mouth 2 (two) times daily.   Yes Historical Provider, MD  carvedilol (COREG CR) 20 MG 24 hr capsule Take 40 mg by mouth daily.    Yes Historical Provider, MD  diphenhydrAMINE (BENADRYL) 25 mg capsule Take 25 mg by mouth every 6 (six) hours as needed for itching.   Yes Historical Provider, MD  doxepin (SINEQUAN) 25 MG capsule Take 25 mg by mouth at bedtime.   Yes Historical Provider, MD  esomeprazole (NEXIUM) 40 MG capsule Take 40 mg by mouth daily as needed (heart burn).    Yes Historical Provider, MD  ferrous sulfate 325  (65 FE) MG tablet Take 325 mg by mouth daily with breakfast.   Yes Historical Provider, MD  glimepiride (AMARYL) 2 MG tablet Take 2 mg by mouth daily before breakfast.   Yes Historical Provider, MD  hydrOXYzine (ATARAX/VISTARIL) 25 MG tablet Take 25 mg by mouth 2 (two) times daily as needed for itching.    Yes Historical Provider, MD  levETIRAcetam (KEPPRA) 500 MG tablet Take 1 tablet (500 mg total) by mouth 2 (two) times daily. 02/27/12  Yes Monika Salk, MD  pregabalin (LYRICA) 75 MG capsule Take 75 mg by mouth 2 (two) times daily. For restless legs and nerve pain   Yes Historical Provider, MD  rOPINIRole (REQUIP) 2 MG tablet Take 2 mg by mouth 4 (four) times daily.    Yes Historical Provider, MD  telmisartan (MICARDIS) 40 MG tablet Take 40 mg by mouth 2 (two) times daily.   Yes Historical Provider, MD  VOLTAREN 1 % GEL Apply 1 application topically 4 (four) times daily as needed (knee pain). For pain 10/26/11  Yes Historical Provider, MD  docusate sodium (COLACE) 100 MG capsule Take 1 capsule (100 mg total) by mouth every 12 (twelve) hours. Patient not taking: Reported on 10/05/2014 08/28/13   Delice Bison Ward, DO  HYDROcodone-acetaminophen (NORCO/VICODIN) 5-325 MG per tablet Take 1 tablet by mouth every 4 (four) hours as needed. Patient not taking: Reported on 10/05/2014 08/28/13   Delice Bison Ward, DO  silver sulfADIAZINE (SILVADENE) 1 % cream Apply 1 application topically daily. Patient not taking: Reported on 10/05/2014 08/28/13   Kristen N Ward, DO   BP 138/77 mmHg  Pulse 72  Temp(Src) 98.5 F (36.9 C) (Oral)  Resp 18  SpO2 93% Physical Exam  Constitutional: She is oriented to person, place, and time. She appears well-developed and well-nourished. No distress.  HENT:  Head: Normocephalic and atraumatic.  Eyes: Pupils are equal, round, and reactive to light.  Neck: Normal range of motion.  Cardiovascular: Normal rate, regular rhythm and normal heart sounds.  Exam reveals no gallop and no friction  rub.   No murmur heard. Pulmonary/Chest: No respiratory distress. She has decreased breath sounds.  Abdominal: Soft. Bowel sounds are normal.  Musculoskeletal: She exhibits edema and tenderness.  Neurological: She is alert and oriented to person, place, and time. No cranial nerve deficit.  Skin: Skin is warm and dry. Rash noted. She is not diaphoretic.  Psychiatric: She has a normal mood and affect. Her behavior is normal.    ED Course  Procedures (including critical care time) Labs Review Labs Reviewed  CBC WITH DIFFERENTIAL/PLATELET - Abnormal; Notable for the following:    WBC 3.9 (*)    Platelets 133 (*)    All other components within normal  limits  BASIC METABOLIC PANEL - Abnormal; Notable for the following:    Glucose, Bld 306 (*)    All other components within normal limits  BRAIN NATRIURETIC PEPTIDE - Abnormal; Notable for the following:    B Natriuretic Peptide 129.6 (*)    All other components within normal limits  I-STAT TROPOININ, ED    Imaging Review Dg Chest 2 View  10/05/2014   CLINICAL DATA:  Left lower extremity swelling and bilateral upper extremity rash. Initial encounter.  EXAM: CHEST  2 VIEW  COMPARISON:  Chest radiograph performed 08/05/2012  FINDINGS: The lungs are well-aerated. Vascular congestion is noted. There is no evidence of focal opacification, pleural effusion or pneumothorax.  The heart is mildly enlarged. No acute osseous abnormalities are seen.  IMPRESSION: Vascular congestion and mild cardiomegaly. Lungs remain grossly clear.   Electronically Signed   By: Garald Balding M.D.   On: 10/05/2014 23:42   Ct Angio Chest Pe W/cm &/or Wo Cm  10/06/2014   CLINICAL DATA:  81 year old female with shortness of breath.  EXAM: CT ANGIOGRAPHY CHEST WITH CONTRAST  TECHNIQUE: Multidetector CT imaging of the chest was performed using the standard protocol during bolus administration of intravenous contrast. Multiplanar CT image reconstructions and MIPs were obtained to  evaluate the vascular anatomy.  CONTRAST:  100 mL Omnipaque 350 IV  COMPARISON:  Radiographs 1 day prior.  Chest CT 07/14/2006  FINDINGS: There are no filling defects within the pulmonary arteries to suggest pulmonary embolus. Evaluation of the subsegmental branches in the lower lobes is limited by contrast bolus timing.  The thoracic aorta is normal in caliber, mild atherosclerosis. The heart is enlarged. There are coronary artery calcifications. There is a small loculated right pleural effusion. Minimal fluid tracks in the right minor fissure. No left pleural effusion. No pericardial effusion. There is no mediastinal or hilar adenopathy.  Atelectasis in the right lower lobe adjacent to pleural effusion. Minimal smooth septal thickening peripherally. No consolidation to suggest pneumonia. The 6 mm left upper lobe pulmonary nodule is unchanged dating back to 2008 considered benign. No new pulmonary nodules are seen.  There is no acute abnormality in the included upper abdomen.  The bones are under mineralized. There is degenerative change throughout the thoracic spine. No acute or suspicious osseous abnormality.  Review of the MIP images confirms the above findings.  IMPRESSION: 1. No pulmonary embolus, allowing for limited visualization of the lower lobe subsegmental branches. 2. Small loculated right pleural effusion with adjacent atelectasis. Minimal smooth septal thickening, may reflect vascular congestion. 3. Atherosclerosis including the coronary arteries.   Electronically Signed   By: Jeb Levering M.D.   On: 10/06/2014 03:28     EKG Interpretation None      MDM   Final diagnoses:  SOB (shortness of breath)  Pleural effusion, right    11:20 PM Labs show elevated glucose without other acute changes. EKG shows afib and troponin negative. Chest xray pending. Vitals stable and patient afebrile.   5:18 AM Patient's chest xray shows vascular congestion. Patient's CT angio shows right pleural  effusion without acute PE or any other acute changes. Patient no longer requiring supplemental oxygen. Vitals stable and patient afebrile. Patient will be discharged with PCP follow up. Dr. Sharol Given saw the patient and is agreeable to plan. Patient and caregiver instructed to return with worsening or concerning symptoms.    [address] [place], PA-C 10/06/14 [id number]  Debby Freiberg, MD 10/06/14 1719

## 2014-10-05 NOTE — ED Notes (Signed)
EMS contacted by patient daughter d/t rash on bilateral upper extremities, pain denied. She also has swelling in LLE mostly foot area. patient reports hx of swelling in BLE d/t CHF. SOB denied.patient is bed bound.

## 2014-10-06 ENCOUNTER — Emergency Department (HOSPITAL_COMMUNITY): Payer: Medicare Other

## 2014-10-06 LAB — BRAIN NATRIURETIC PEPTIDE: B NATRIURETIC PEPTIDE 5: 129.6 pg/mL — AB (ref 0.0–100.0)

## 2014-10-06 MED ORDER — HYDROCORTISONE 1 % EX CREA: TOPICAL_CREAM | CUTANEOUS | Status: DC

## 2014-10-06 MED ORDER — IOHEXOL 350 MG/ML SOLN
100.0000 mL | Freq: Once | INTRAVENOUS | Status: AC | PRN
  Administered 2014-10-06: 100 mL via INTRAVENOUS

## 2014-10-06 NOTE — Discharge Instructions (Signed)
Follow up with your doctor for further evaluation. Refer to attached documents for more information. Return to the ED with worsening or concerning symptoms.  °

## 2014-10-06 NOTE — ED Notes (Signed)
[facility] here to transport pt back to home. 

## 2014-10-06 NOTE — ED Notes (Signed)
Patient transported to CT 

## 2015-01-19 ENCOUNTER — Encounter (HOSPITAL_COMMUNITY): Payer: Self-pay | Admitting: Emergency Medicine

## 2015-01-19 ENCOUNTER — Inpatient Hospital Stay (HOSPITAL_COMMUNITY)
Admission: EM | Admit: 2015-01-19 | Discharge: 2015-01-25 | DRG: 291 | Disposition: A | Discharge status: Skilled Nursing Facility | Payer: Medicare Other | Attending: Internal Medicine | Admitting: Internal Medicine

## 2015-01-19 ENCOUNTER — Emergency Department (HOSPITAL_COMMUNITY): Payer: Medicare Other

## 2015-01-19 DIAGNOSIS — Z823 Family history of stroke: Secondary | ICD-10-CM

## 2015-01-19 DIAGNOSIS — I10 Essential (primary) hypertension: Secondary | ICD-10-CM | POA: Diagnosis present

## 2015-01-19 DIAGNOSIS — J9601 Acute respiratory failure with hypoxia: Secondary | ICD-10-CM | POA: Diagnosis not present

## 2015-01-19 DIAGNOSIS — R0689 Other abnormalities of breathing: Secondary | ICD-10-CM | POA: Diagnosis present

## 2015-01-19 DIAGNOSIS — E1165 Type 2 diabetes mellitus with hyperglycemia: Secondary | ICD-10-CM | POA: Diagnosis present

## 2015-01-19 DIAGNOSIS — Z79899 Other long term (current) drug therapy: Secondary | ICD-10-CM

## 2015-01-19 DIAGNOSIS — G4733 Obstructive sleep apnea (adult) (pediatric): Secondary | ICD-10-CM | POA: Diagnosis present

## 2015-01-19 DIAGNOSIS — E8779 Other fluid overload: Secondary | ICD-10-CM

## 2015-01-19 DIAGNOSIS — G40909 Epilepsy, unspecified, not intractable, without status epilepticus: Secondary | ICD-10-CM | POA: Diagnosis present

## 2015-01-19 DIAGNOSIS — G8929 Other chronic pain: Secondary | ICD-10-CM | POA: Diagnosis present

## 2015-01-19 DIAGNOSIS — Z7401 Bed confinement status: Secondary | ICD-10-CM

## 2015-01-19 DIAGNOSIS — Z8673 Personal history of transient ischemic attack (TIA), and cerebral infarction without residual deficits: Secondary | ICD-10-CM

## 2015-01-19 DIAGNOSIS — J449 Chronic obstructive pulmonary disease, unspecified: Secondary | ICD-10-CM | POA: Diagnosis present

## 2015-01-19 DIAGNOSIS — IMO0002 Reserved for concepts with insufficient information to code with codable children: Secondary | ICD-10-CM | POA: Diagnosis present

## 2015-01-19 DIAGNOSIS — Z7902 Long term (current) use of antithrombotics/antiplatelets: Secondary | ICD-10-CM

## 2015-01-19 DIAGNOSIS — Z87891 Personal history of nicotine dependence: Secondary | ICD-10-CM

## 2015-01-19 DIAGNOSIS — I482 Chronic atrial fibrillation, unspecified: Secondary | ICD-10-CM | POA: Diagnosis present

## 2015-01-19 DIAGNOSIS — L308 Other specified dermatitis: Secondary | ICD-10-CM | POA: Diagnosis present

## 2015-01-19 DIAGNOSIS — Z825 Family history of asthma and other chronic lower respiratory diseases: Secondary | ICD-10-CM

## 2015-01-19 DIAGNOSIS — J9690 Respiratory failure, unspecified, unspecified whether with hypoxia or hypercapnia: Secondary | ICD-10-CM | POA: Diagnosis present

## 2015-01-19 DIAGNOSIS — K219 Gastro-esophageal reflux disease without esophagitis: Secondary | ICD-10-CM | POA: Diagnosis present

## 2015-01-19 DIAGNOSIS — Z8249 Family history of ischemic heart disease and other diseases of the circulatory system: Secondary | ICD-10-CM

## 2015-01-19 DIAGNOSIS — Z794 Long term (current) use of insulin: Secondary | ICD-10-CM

## 2015-01-19 DIAGNOSIS — I5033 Acute on chronic diastolic (congestive) heart failure: Principal | ICD-10-CM | POA: Diagnosis present

## 2015-01-19 DIAGNOSIS — E662 Morbid (severe) obesity with alveolar hypoventilation: Secondary | ICD-10-CM | POA: Diagnosis present

## 2015-01-19 DIAGNOSIS — J811 Chronic pulmonary edema: Secondary | ICD-10-CM

## 2015-01-19 DIAGNOSIS — G2581 Restless legs syndrome: Secondary | ICD-10-CM | POA: Diagnosis present

## 2015-01-19 DIAGNOSIS — J9602 Acute respiratory failure with hypercapnia: Secondary | ICD-10-CM | POA: Diagnosis present

## 2015-01-19 DIAGNOSIS — Z6839 Body mass index (BMI) 39.0-39.9, adult: Secondary | ICD-10-CM

## 2015-01-19 LAB — COMPREHENSIVE METABOLIC PANEL
ALBUMIN: 3.5 g/dL (ref 3.5–5.0)
ALK PHOS: 56 U/L (ref 38–126)
ALT: 21 U/L (ref 14–54)
AST: 18 U/L (ref 15–41)
Anion gap: 8 (ref 5–15)
BILIRUBIN TOTAL: 1 mg/dL (ref 0.3–1.2)
BUN: 20 mg/dL (ref 6–20)
CALCIUM: 8.6 mg/dL — AB (ref 8.9–10.3)
CO2: 29 mmol/L (ref 22–32)
Chloride: 97 mmol/L — ABNORMAL LOW (ref 101–111)
Creatinine, Ser: 0.73 mg/dL (ref 0.44–1.00)
GFR calc Af Amer: >60 mL/min (ref >60)
GFR calc non Af Amer: >60 mL/min (ref >60)
GLUCOSE: 304 mg/dL — AB (ref 65–99)
Potassium: 4.6 mmol/L (ref 3.5–5.1)
Sodium: 134 mmol/L — ABNORMAL LOW (ref 135–145)
Total Protein: 6.6 g/dL (ref 6.5–8.1)

## 2015-01-19 LAB — URINALYSIS, ROUTINE W REFLEX MICROSCOPIC
BILIRUBIN URINE: NEGATIVE
KETONES UR: 15 mg/dL — AB
LEUKOCYTES UA: NEGATIVE
NITRITE: NEGATIVE
PH: 5 (ref 5.0–8.0)
PROTEIN: 100 mg/dL — AB
Specific Gravity, Urine: 1.037 — ABNORMAL HIGH (ref 1.005–1.030)
Urobilinogen, UA: 1 mg/dL (ref 0.0–1.0)

## 2015-01-19 LAB — CBC WITH DIFFERENTIAL/PLATELET
BASOS ABS: 0 10*3/uL (ref 0.0–0.1)
Basophils Relative: 0 %
Eosinophils Absolute: 0.1 10*3/uL (ref 0.0–0.7)
Eosinophils Relative: 2 %
HEMATOCRIT: 42.1 % (ref 36.0–46.0)
Hemoglobin: 12.9 g/dL (ref 12.0–15.0)
Lymphocytes Relative: 22 %
Lymphs Abs: 0.7 10*3/uL (ref 0.7–4.0)
MCH: 30 pg (ref 26.0–34.0)
MCHC: 30.6 g/dL (ref 30.0–36.0)
MCV: 97.9 fL (ref 78.0–100.0)
Monocytes Absolute: 0.3 10*3/uL (ref 0.1–1.0)
Monocytes Relative: 9 %
NEUTROS ABS: 2.3 10*3/uL (ref 1.7–7.7)
NEUTROS PCT: 67 %
Platelets: 123 10*3/uL — ABNORMAL LOW (ref 150–400)
RBC: 4.3 MIL/uL (ref 3.87–5.11)
RDW: 14.9 % (ref 11.5–15.5)
WBC: 3.3 10*3/uL — AB (ref 4.0–10.5)

## 2015-01-19 LAB — BRAIN NATRIURETIC PEPTIDE: B Natriuretic Peptide: 180.3 pg/mL — ABNORMAL HIGH (ref 0.0–100.0)

## 2015-01-19 LAB — I-STAT VENOUS BLOOD GAS, ED
Acid-Base Excess: 7 mmol/L — ABNORMAL HIGH (ref 0.0–2.0)
Bicarbonate: 37.4 mEq/L — ABNORMAL HIGH (ref 20.0–24.0)
O2 SAT: 70 %
PCO2 VEN: 81.5 mmHg — AB (ref 45.0–50.0)
TCO2: 40 mmol/L (ref 0–100)
pH, Ven: 7.27 (ref 7.250–7.300)
pO2, Ven: 44 mmHg (ref 30.0–45.0)

## 2015-01-19 LAB — I-STAT CG4 LACTIC ACID, ED: Lactic Acid, Venous: 0.96 mmol/L (ref 0.5–2.0)

## 2015-01-19 LAB — URINE MICROSCOPIC-ADD ON

## 2015-01-19 LAB — I-STAT TROPONIN, ED: Troponin i, poc: 0 ng/mL (ref 0.00–0.08)

## 2015-01-19 MED ORDER — IPRATROPIUM-ALBUTEROL 0.5-2.5 (3) MG/3ML IN SOLN
3.0000 mL | Freq: Once | RESPIRATORY_TRACT | Status: AC
  Administered 2015-01-19: 3 mL via RESPIRATORY_TRACT
  Filled 2015-01-19: qty 3

## 2015-01-19 NOTE — ED Provider Notes (Signed)
CSN: [id number]     Arrival date & time 01/19/15  2215 History   First MD Initiated Contact with Patient 01/19/15 2226     Chief Complaint  Patient presents with  . Shortness of Breath     (Consider location/radiation/quality/duration/timing/severity/associated sxs/prior Treatment) HPI 81 year old female who presents with difficulty breathing. History of CVA, morbid obesity, CHF, atrial fibrillation, hypertension and bronchitis. Patient is a poor historian, and history primarily taken from the patient's daughter. She reports that patient has been complaining of on and off shortness of breath over the past week, and has had on and off somnolence. She has had mild cough during this time, but no fevers or sputum production. Daughter has also noticed increasing lower extremity edema. She is primarily bedbound. She has noticed tonight that she appeared more somnolent and had increased work of breathing, so she called EMS. On EMS arrival the patient was hypoxic to 85% on room air. 6 L nasal cannula was applied, with normal oxygenation.   Past Medical History  Diagnosis Date  . Stroke   . GERD (gastroesophageal reflux disease)   . Hypertension   . Diabetes mellitus   . Blood transfusion   . Arthritis   . Anxiety   . Anemia   . GI (gastrointestinal bleed)     Due to benign mass  . Bronchitis   . Restless leg syndrome   . Atrial fibrillation   . Acute delirium 04/12/2011  . Seizures   . CHF (congestive heart failure)    Past Surgical History  Procedure Laterality Date  . Eye surgery      Cataract removal  . Abdominal hysterectomy    . Tubal ligation    . Eus  09/24/2011    Procedure: UPPER ENDOSCOPIC ULTRASOUND (EUS) LINEAR;  Surgeon: Beryle Beams, MD;  Location: [facility];  Service: Endoscopy;  Laterality: N/A;   Family History  Problem Relation Age of Onset  . Aneurysm Mother   . Heart attack Father   . Heart failure Sister   . Asthma Sister   . Kidney failure Sister   .  Stroke Sister    Social History  Substance Use Topics  . Smoking status: Former Smoker    Types: Cigarettes    Quit date: 09/21/1985  . Smokeless tobacco: Never Used  . Alcohol Use: No   OB History    No data available     Review of Systems  Unable to perform ROS: Mental status change      Allergies  Erythromycin; Penicillins; and Zithromax  Home Medications   Prior to Admission medications   Medication Sig Start Date End Date Taking? Authorizing Provider  albuterol (PROVENTIL HFA;VENTOLIN HFA) 108 (90 BASE) MCG/ACT inhaler Inhale 1 puff into the lungs every 6 (six) hours as needed for wheezing or shortness of breath.   Yes Historical Provider, MD  amLODipine (NORVASC) 5 MG tablet Take 5 mg by mouth daily.   Yes Historical Provider, MD  apixaban (ELIQUIS) 2.5 MG TABS tablet Take 2.5 mg by mouth 2 (two) times daily.   Yes Historical Provider, MD  calcium-vitamin D (CALCIUM 500+D) 500-400 MG-UNIT per tablet Take 1 tablet by mouth 2 (two) times daily.   Yes Historical Provider, MD  carvedilol (COREG CR) 20 MG 24 hr capsule Take 40 mg by mouth daily.    Yes Historical Provider, MD  doxepin (SINEQUAN) 25 MG capsule Take 25 mg by mouth at bedtime.   Yes Historical Provider, MD  esomeprazole (Wilson) 40  MG capsule Take 40 mg by mouth daily as needed (heart burn).    Yes Historical Provider, MD  ferrous sulfate 325 (65 FE) MG tablet Take 325 mg by mouth daily with breakfast.   Yes Historical Provider, MD  glimepiride (AMARYL) 2 MG tablet Take 2 mg by mouth daily before breakfast.   Yes Historical Provider, MD  hydrOXYzine (ATARAX/VISTARIL) 25 MG tablet Take 25 mg by mouth 2 (two) times daily as needed for itching.    Yes Historical Provider, MD  insulin aspart (NOVOLOG) 100 UNIT/ML injection Inject 1-20 Units into the skin 3 (three) times daily before meals. 1 unit for every 20 over glucose of 150   Yes Historical Provider, MD  levETIRAcetam (KEPPRA) 500 MG tablet Take 1 tablet (500 mg  total) by mouth 2 (two) times daily. 02/27/12  Yes Monika Salk, MD  pregabalin (LYRICA) 75 MG capsule Take 75 mg by mouth 2 (two) times daily. For restless legs and nerve pain   Yes Historical Provider, MD  rOPINIRole (REQUIP) 2 MG tablet Take 2 mg by mouth 4 (four) times daily.    Yes Historical Provider, MD  telmisartan (MICARDIS) 40 MG tablet Take 40 mg by mouth 2 (two) times daily.   Yes Historical Provider, MD  VOLTAREN 1 % GEL Apply 1 application topically 4 (four) times daily as needed (knee pain). For pain 10/26/11  Yes Historical Provider, MD   BP 124/99 mmHg  Pulse 67  Temp(Src) 98 F (36.7 C) (Oral)  Resp 18  SpO2 100% Physical Exam Physical Exam  Nursing note and vitals reviewed. Constitutional: Chronically ill appearing, no acute distress, somnolent but arouses to voice and tactile stimulation Head: Normocephalic and atraumatic.  Mouth/Throat: Oropharynx is clear and moist.  Neck: Normal range of motion. Neck supple.  Cardiovascular: Normal rate and irregularly irregular rhythm.  +2 pitting edema bilaterally Pulmonary/Chest: Effort normal and bibasilar crackles, diminished air movement throughout but limited due to body habitus Abdominal: Soft. Morbidly obese. There is no tenderness. There is no rebound and no guarding.  Musculoskeletal: Normal range of motion.  Neurological: Somnolent, arouses to voice and tactile stimulation Skin: Skin is warm and dry.  Psychiatric: Cooperative  ED Course  Procedures (including critical care time) Labs Review Labs Reviewed  BRAIN NATRIURETIC PEPTIDE - Abnormal; Notable for the following:    B Natriuretic Peptide 180.3 (*)    All other components within normal limits  CBC WITH DIFFERENTIAL/PLATELET - Abnormal; Notable for the following:    WBC 3.3 (*)    Platelets 123 (*)    All other components within normal limits  COMPREHENSIVE METABOLIC PANEL - Abnormal; Notable for the following:    Sodium 134 (*)    Chloride 97 (*)     Glucose, Bld 304 (*)    Calcium 8.6 (*)    All other components within normal limits  URINALYSIS, ROUTINE W REFLEX MICROSCOPIC (NOT AT [facility]) - Abnormal; Notable for the following:    APPearance CLOUDY (*)    Specific Gravity, Urine 1.037 (*)    Glucose, UA >1000 (*)    Hgb urine dipstick SMALL (*)    Ketones, ur 15 (*)    Protein, ur 100 (*)    All other components within normal limits  URINE MICROSCOPIC-ADD ON - Abnormal; Notable for the following:    Bacteria, UA MANY (*)    Casts HYALINE CASTS (*)    All other components within normal limits  I-STAT VENOUS BLOOD GAS, ED - Abnormal; Notable  for the following:    pCO2, Ven 81.5 (*)    Bicarbonate 37.4 (*)    Acid-Base Excess 7.0 (*)    All other components within normal limits  BLOOD GAS, VENOUS  URINALYSIS W MICROSCOPIC  I-STAT TROPOININ, ED  I-STAT CG4 LACTIC ACID, ED    Imaging Review No results found. I have personally reviewed and evaluated these images and lab results as part of my medical decision-making.   EKG Interpretation   Date/Time:  Saturday January 19 2015 22:26:07 EDT Ventricular Rate:  73 PR Interval:    QRS Duration: 114 QT Interval:  416 QTC Calculation: 458 R Axis:   68 Text Interpretation:  Atrial fibrillation Incomplete left bundle branch  block No significant change since last tracing Confirmed by LIU MD, DANA  ([id number]) on 01/19/2015 11:28:10 PM      MDM   Final diagnoses:  Acute respiratory failure with hypoxia  Acute respiratory failure with hypercapnia  Other hypervolemia    81 year old female presenting in acute hypoxic and hypercapnic respiratory failure. On arrival, she is chronically ill-appearing, mildly somnolent, but in no respiratory distress. On room air, she is hypoxic to 87%. She arouses to voice and tactile stimulation, and answers questions appropriately. Oxygenation improves with supplemental oxygen. She appears fluid overloaded on exam, with bibasilar crackles and +2  pitting edema. VBG shows hypercapnia with a PCO2 of 82 and a pH of 7.2. At this time, she is placed on BiPAP for respiratory failure associated with hypercapnia and hypoxia. Chest x-ray shows cardiomegaly, with pulmonary vascular congestion. Afebrile, without signs of pneumonia. EKG shows atrial fibrillation that is rate controlled. No ischemic changes are noted and troponin x 1 negative. Given 20 mg of IV Lasix for likely CHF exacerbation; although lung sounds are difficult to auscultate due to obesity. No wheezing noted, and she does not appear to have a history of COPD. Discussed with hospitalist, and admitted to stepdown for ongoing management. Discussed with patient. She is full code.   CRITICAL CARE Performed by: Forde Dandy   Total critical care time: 35 minutes  Critical care time was exclusive of separately billable procedures and treating other patients.  Critical care was necessary to treat or prevent imminent or life-threatening deterioration.  Critical care was time spent personally by me on the following activities: management of hypoxic and hypercapneic respiratory failure, development of treatment plan with patient and/or surrogate as well as nursing, discussions with consultants, evaluation of patient's response to treatment, examination of patient, obtaining history from patient or surrogate, ordering and performing treatments and interventions, ordering and review of laboratory studies, ordering and review of radiographic studies, pulse oximetry and re-evaluation of patient's condition.   Forde Dandy, MD 01/20/15 [phone]

## 2015-01-19 NOTE — ED Notes (Signed)
Pt presents with [facility] from home for SOB with crackles in lungs and pitting edema; pt report hx of CHF, DM, stroke, and HTN; pt CAOx4 at this time; EMS reports room air o2 sat 89%-- 95% on 6L via [place]

## 2015-01-19 NOTE — ED Notes (Signed)
Taken for Xray at this time.

## 2015-01-20 ENCOUNTER — Inpatient Hospital Stay (HOSPITAL_COMMUNITY): Payer: Medicare Other

## 2015-01-20 DIAGNOSIS — L308 Other specified dermatitis: Secondary | ICD-10-CM | POA: Diagnosis present

## 2015-01-20 DIAGNOSIS — G40909 Epilepsy, unspecified, not intractable, without status epilepticus: Secondary | ICD-10-CM | POA: Diagnosis present

## 2015-01-20 DIAGNOSIS — J810 Acute pulmonary edema: Secondary | ICD-10-CM | POA: Diagnosis not present

## 2015-01-20 DIAGNOSIS — R627 Adult failure to thrive: Secondary | ICD-10-CM

## 2015-01-20 DIAGNOSIS — E662 Morbid (severe) obesity with alveolar hypoventilation: Secondary | ICD-10-CM | POA: Diagnosis present

## 2015-01-20 DIAGNOSIS — R609 Edema, unspecified: Secondary | ICD-10-CM

## 2015-01-20 DIAGNOSIS — R0689 Other abnormalities of breathing: Secondary | ICD-10-CM | POA: Diagnosis present

## 2015-01-20 DIAGNOSIS — R0600 Dyspnea, unspecified: Secondary | ICD-10-CM | POA: Diagnosis not present

## 2015-01-20 DIAGNOSIS — J9690 Respiratory failure, unspecified, unspecified whether with hypoxia or hypercapnia: Secondary | ICD-10-CM | POA: Diagnosis present

## 2015-01-20 DIAGNOSIS — J9600 Acute respiratory failure, unspecified whether with hypoxia or hypercapnia: Secondary | ICD-10-CM | POA: Diagnosis not present

## 2015-01-20 DIAGNOSIS — G2581 Restless legs syndrome: Secondary | ICD-10-CM | POA: Diagnosis present

## 2015-01-20 DIAGNOSIS — I10 Essential (primary) hypertension: Secondary | ICD-10-CM | POA: Diagnosis present

## 2015-01-20 DIAGNOSIS — Z6839 Body mass index (BMI) 39.0-39.9, adult: Secondary | ICD-10-CM | POA: Diagnosis not present

## 2015-01-20 DIAGNOSIS — Z825 Family history of asthma and other chronic lower respiratory diseases: Secondary | ICD-10-CM | POA: Diagnosis not present

## 2015-01-20 DIAGNOSIS — J9602 Acute respiratory failure with hypercapnia: Secondary | ICD-10-CM | POA: Diagnosis present

## 2015-01-20 DIAGNOSIS — Z8673 Personal history of transient ischemic attack (TIA), and cerebral infarction without residual deficits: Secondary | ICD-10-CM | POA: Diagnosis not present

## 2015-01-20 DIAGNOSIS — Z7902 Long term (current) use of antithrombotics/antiplatelets: Secondary | ICD-10-CM | POA: Diagnosis not present

## 2015-01-20 DIAGNOSIS — I482 Chronic atrial fibrillation: Secondary | ICD-10-CM | POA: Diagnosis present

## 2015-01-20 DIAGNOSIS — I5033 Acute on chronic diastolic (congestive) heart failure: Secondary | ICD-10-CM | POA: Diagnosis present

## 2015-01-20 DIAGNOSIS — J9601 Acute respiratory failure with hypoxia: Secondary | ICD-10-CM | POA: Diagnosis present

## 2015-01-20 DIAGNOSIS — Z87891 Personal history of nicotine dependence: Secondary | ICD-10-CM | POA: Diagnosis not present

## 2015-01-20 DIAGNOSIS — Z794 Long term (current) use of insulin: Secondary | ICD-10-CM | POA: Diagnosis not present

## 2015-01-20 DIAGNOSIS — Z823 Family history of stroke: Secondary | ICD-10-CM | POA: Diagnosis not present

## 2015-01-20 DIAGNOSIS — G4733 Obstructive sleep apnea (adult) (pediatric): Secondary | ICD-10-CM | POA: Diagnosis present

## 2015-01-20 DIAGNOSIS — G8929 Other chronic pain: Secondary | ICD-10-CM | POA: Diagnosis present

## 2015-01-20 DIAGNOSIS — K219 Gastro-esophageal reflux disease without esophagitis: Secondary | ICD-10-CM | POA: Diagnosis present

## 2015-01-20 DIAGNOSIS — Z7401 Bed confinement status: Secondary | ICD-10-CM | POA: Diagnosis not present

## 2015-01-20 DIAGNOSIS — Z8249 Family history of ischemic heart disease and other diseases of the circulatory system: Secondary | ICD-10-CM | POA: Diagnosis not present

## 2015-01-20 DIAGNOSIS — Z79899 Other long term (current) drug therapy: Secondary | ICD-10-CM | POA: Diagnosis not present

## 2015-01-20 DIAGNOSIS — E1165 Type 2 diabetes mellitus with hyperglycemia: Secondary | ICD-10-CM | POA: Diagnosis present

## 2015-01-20 DIAGNOSIS — J449 Chronic obstructive pulmonary disease, unspecified: Secondary | ICD-10-CM | POA: Diagnosis present

## 2015-01-20 DIAGNOSIS — I5031 Acute diastolic (congestive) heart failure: Secondary | ICD-10-CM | POA: Diagnosis not present

## 2015-01-20 LAB — COMPREHENSIVE METABOLIC PANEL
ALBUMIN: 3.3 g/dL — AB (ref 3.5–5.0)
ALK PHOS: 50 U/L (ref 38–126)
ALT: 19 U/L (ref 14–54)
ANION GAP: 5 (ref 5–15)
AST: 17 U/L (ref 15–41)
BUN: 17 mg/dL (ref 6–20)
CALCIUM: 9 mg/dL (ref 8.9–10.3)
CO2: 35 mmol/L — AB (ref 22–32)
CREATININE: 0.89 mg/dL (ref 0.44–1.00)
Chloride: 101 mmol/L (ref 101–111)
GFR calc Af Amer: >60 mL/min (ref >60)
GFR calc non Af Amer: 57 mL/min — ABNORMAL LOW (ref >60)
GLUCOSE: 187 mg/dL — AB (ref 65–99)
Potassium: 4.2 mmol/L (ref 3.5–5.1)
SODIUM: 141 mmol/L (ref 135–145)
Total Bilirubin: 1 mg/dL (ref 0.3–1.2)
Total Protein: 6.3 g/dL — ABNORMAL LOW (ref 6.5–8.1)

## 2015-01-20 LAB — MAGNESIUM: Magnesium: 1.8 mg/dL (ref 1.7–2.4)

## 2015-01-20 LAB — CBC
HCT: 41.6 % (ref 36.0–46.0)
HEMOGLOBIN: 12.2 g/dL (ref 12.0–15.0)
MCH: 28.9 pg (ref 26.0–34.0)
MCHC: 29.3 g/dL — ABNORMAL LOW (ref 30.0–36.0)
MCV: 98.6 fL (ref 78.0–100.0)
Platelets: 120 10*3/uL — ABNORMAL LOW (ref 150–400)
RBC: 4.22 MIL/uL (ref 3.87–5.11)
RDW: 15 % (ref 11.5–15.5)
WBC: 4.1 10*3/uL (ref 4.0–10.5)

## 2015-01-20 LAB — GLUCOSE, CAPILLARY
GLUCOSE-CAPILLARY: 227 mg/dL — AB (ref 65–99)
GLUCOSE-CAPILLARY: 121 mg/dL — AB (ref 65–99)
GLUCOSE-CAPILLARY: 99 mg/dL (ref 65–99)
Glucose-Capillary: 170 mg/dL — ABNORMAL HIGH (ref 65–99)
Glucose-Capillary: 134 mg/dL — ABNORMAL HIGH (ref 65–99)

## 2015-01-20 LAB — TSH: TSH: 1.087 u[IU]/mL (ref 0.350–4.500)

## 2015-01-20 LAB — MRSA PCR SCREENING: MRSA BY PCR: NEGATIVE

## 2015-01-20 MED ORDER — ROPINIROLE HCL 1 MG PO TABS
2.0000 mg | ORAL_TABLET | Freq: Four times a day (QID) | ORAL | Status: DC
  Administered 2015-01-20 – 2015-01-25 (×20): 2 mg via ORAL
  Filled 2015-01-20 (×22): qty 2

## 2015-01-20 MED ORDER — DOXEPIN HCL 25 MG PO CAPS
25.0000 mg | ORAL_CAPSULE | Freq: Every day | ORAL | Status: DC
  Administered 2015-01-20 – 2015-01-24 (×5): 25 mg via ORAL
  Filled 2015-01-20 (×8): qty 1

## 2015-01-20 MED ORDER — LEVETIRACETAM 500 MG PO TABS
500.0000 mg | ORAL_TABLET | Freq: Two times a day (BID) | ORAL | Status: DC
  Administered 2015-01-20 – 2015-01-25 (×10): 500 mg via ORAL
  Filled 2015-01-20 (×10): qty 1

## 2015-01-20 MED ORDER — HYDROXYZINE HCL 25 MG PO TABS
25.0000 mg | ORAL_TABLET | Freq: Two times a day (BID) | ORAL | Status: DC | PRN
  Administered 2015-01-20 – 2015-01-24 (×4): 25 mg via ORAL
  Filled 2015-01-20 (×5): qty 1

## 2015-01-20 MED ORDER — IPRATROPIUM-ALBUTEROL 0.5-2.5 (3) MG/3ML IN SOLN
3.0000 mL | Freq: Four times a day (QID) | RESPIRATORY_TRACT | Status: DC
  Administered 2015-01-20 – 2015-01-25 (×16): 3 mL via RESPIRATORY_TRACT
  Filled 2015-01-20 (×17): qty 3

## 2015-01-20 MED ORDER — ENOXAPARIN SODIUM 100 MG/ML ~~LOC~~ SOLN
90.0000 mg | Freq: Two times a day (BID) | SUBCUTANEOUS | Status: DC
  Administered 2015-01-20: 90 mg via SUBCUTANEOUS
  Filled 2015-01-20: qty 1

## 2015-01-20 MED ORDER — METOPROLOL TARTRATE 1 MG/ML IV SOLN
2.5000 mg | Freq: Three times a day (TID) | INTRAVENOUS | Status: DC
  Administered 2015-01-20: 2.5 mg via INTRAVENOUS
  Filled 2015-01-20 (×2): qty 5

## 2015-01-20 MED ORDER — INSULIN ASPART 100 UNIT/ML ~~LOC~~ SOLN
0.0000 [IU] | SUBCUTANEOUS | Status: DC
  Administered 2015-01-20: 7 [IU] via SUBCUTANEOUS

## 2015-01-20 MED ORDER — APIXABAN 5 MG PO TABS: 5.0000 mg | ORAL_TABLET | Freq: Two times a day (BID) | ORAL | Status: DC

## 2015-01-20 MED ORDER — IPRATROPIUM-ALBUTEROL 0.5-2.5 (3) MG/3ML IN SOLN
3.0000 mL | Freq: Four times a day (QID) | RESPIRATORY_TRACT | Status: DC
  Administered 2015-01-20 (×3): 3 mL via RESPIRATORY_TRACT
  Filled 2015-01-20 (×3): qty 3

## 2015-01-20 MED ORDER — CARVEDILOL PHOSPHATE ER 40 MG PO CP24
40.0000 mg | ORAL_CAPSULE | Freq: Every day | ORAL | Status: DC
  Administered 2015-01-20 – 2015-01-23 (×4): 40 mg via ORAL
  Filled 2015-01-20 (×4): qty 1

## 2015-01-20 MED ORDER — FUROSEMIDE 10 MG/ML IJ SOLN
20.0000 mg | Freq: Once | INTRAMUSCULAR | Status: AC
  Administered 2015-01-20: 20 mg via INTRAVENOUS
  Filled 2015-01-20: qty 2

## 2015-01-20 MED ORDER — APIXABAN 2.5 MG PO TABS
2.5000 mg | ORAL_TABLET | Freq: Two times a day (BID) | ORAL | Status: DC
  Administered 2015-01-20 – 2015-01-25 (×10): 2.5 mg via ORAL
  Filled 2015-01-20 (×10): qty 1

## 2015-01-20 MED ORDER — SODIUM CHLORIDE 0.9 % IV SOLN
500.0000 mg | Freq: Two times a day (BID) | INTRAVENOUS | Status: DC
  Administered 2015-01-20 (×2): 500 mg via INTRAVENOUS
  Filled 2015-01-20 (×3): qty 5

## 2015-01-20 MED ORDER — GUAIFENESIN ER 600 MG PO TB12
600.0000 mg | ORAL_TABLET | Freq: Two times a day (BID) | ORAL | Status: DC
  Administered 2015-01-21: 600 mg via ORAL
  Filled 2015-01-20: qty 1

## 2015-01-20 MED ORDER — ALBUTEROL SULFATE (2.5 MG/3ML) 0.083% IN NEBU: 2.5000 mg | INHALATION_SOLUTION | RESPIRATORY_TRACT | Status: DC | PRN

## 2015-01-20 MED ORDER — INSULIN ASPART 100 UNIT/ML ~~LOC~~ SOLN
0.0000 [IU] | Freq: Three times a day (TID) | SUBCUTANEOUS | Status: DC
  Administered 2015-01-21: 7 [IU] via SUBCUTANEOUS
  Administered 2015-01-21: 4 [IU] via SUBCUTANEOUS
  Administered 2015-01-21: 7 [IU] via SUBCUTANEOUS
  Administered 2015-01-22 – 2015-01-23 (×5): 4 [IU] via SUBCUTANEOUS
  Administered 2015-01-23: 7 [IU] via SUBCUTANEOUS
  Administered 2015-01-24 (×2): 4 [IU] via SUBCUTANEOUS

## 2015-01-20 MED ORDER — FUROSEMIDE 10 MG/ML IJ SOLN
40.0000 mg | Freq: Two times a day (BID) | INTRAMUSCULAR | Status: DC
  Administered 2015-01-20 – 2015-01-23 (×7): 40 mg via INTRAVENOUS
  Filled 2015-01-20 (×7): qty 4

## 2015-01-20 NOTE — Progress Notes (Signed)
ANTICOAGULATION CONSULT NOTE - Initial Consult  Pharmacy Consult for Lovenox Indication: atrial fibrillation  Allergies  Allergen Reactions  . Erythromycin Other (See Comments)    Vaginal itching  . Penicillins Rash  . Zithromax [Azithromycin] Other (See Comments)    gave her a yeast infection.    Patient Measurements: Weight: 200 lb (90.719 kg) (unable to get an accurate weight due to being bed bound and on a bipap)   Vital Signs: Temp: 98 F (36.7 C) (09/17 2223) Temp Source: Oral (09/17 2223) BP: 129/67 mmHg (09/18 0045) Pulse Rate: 75 (09/18 0045)  Labs:  Recent Labs  01/19/15 2258  HGB 12.9  HCT 42.1  PLT 123*  CREATININE 0.73    CrCl cannot be calculated (Unknown ideal weight.).   Medical History: Past Medical History  Diagnosis Date  . Stroke   . GERD (gastroesophageal reflux disease)   . Hypertension   . Diabetes mellitus   . Blood transfusion   . Arthritis   . Anxiety   . Anemia   . GI (gastrointestinal bleed)     Due to benign mass  . Bronchitis   . Restless leg syndrome   . Atrial fibrillation   . Acute delirium 04/12/2011  . Seizures   . CHF (congestive heart failure)     Medications:  Prescriptions prior to admission  Medication Sig Dispense Refill Last Dose  . albuterol (PROVENTIL HFA;VENTOLIN HFA) 108 (90 BASE) MCG/ACT inhaler Inhale 1 puff into the lungs every 6 (six) hours as needed for wheezing or shortness of breath.   01/19/2015 at Unknown time  . amLODipine (NORVASC) 5 MG tablet Take 5 mg by mouth daily.   01/19/2015 at Unknown time  . apixaban (ELIQUIS) 2.5 MG TABS tablet Take 2.5 mg by mouth 2 (two) times daily.   01/19/2015 at Unknown time  . calcium-vitamin D (CALCIUM 500+D) 500-400 MG-UNIT per tablet Take 1 tablet by mouth 2 (two) times daily.   01/19/2015 at Unknown time  . carvedilol (COREG CR) 20 MG 24 hr capsule Take 40 mg by mouth daily.    01/19/2015 at 0800  . doxepin (SINEQUAN) 25 MG capsule Take 25 mg by mouth at  bedtime.   01/19/2015 at Unknown time  . esomeprazole (NEXIUM) 40 MG capsule Take 40 mg by mouth daily as needed (heart burn).    01/19/2015 at Unknown time  . ferrous sulfate 325 (65 FE) MG tablet Take 325 mg by mouth daily with breakfast.   01/19/2015 at Unknown time  . glimepiride (AMARYL) 2 MG tablet Take 2 mg by mouth daily before breakfast.   01/19/2015 at Unknown time  . hydrOXYzine (ATARAX/VISTARIL) 25 MG tablet Take 25 mg by mouth 2 (two) times daily as needed for itching.    01/19/2015 at Unknown time  . insulin aspart (NOVOLOG) 100 UNIT/ML injection Inject 1-20 Units into the skin 3 (three) times daily before meals. 1 unit for every 20 over glucose of 150   01/19/2015 at Unknown time  . levETIRAcetam (KEPPRA) 500 MG tablet Take 1 tablet (500 mg total) by mouth 2 (two) times daily. 30 tablet 2 01/19/2015 at Unknown time  . pregabalin (LYRICA) 75 MG capsule Take 75 mg by mouth 2 (two) times daily. For restless legs and nerve pain   01/19/2015 at Unknown time  . rOPINIRole (REQUIP) 2 MG tablet Take 2 mg by mouth 4 (four) times daily.    01/19/2015 at Unknown time  . telmisartan (MICARDIS) 40 MG tablet Take 40 mg by  mouth 2 (two) times daily.   01/19/2015 at Unknown time  . VOLTAREN 1 % GEL Apply 1 application topically 4 (four) times daily as needed (knee pain). For pain   01/19/2015 at Unknown time    Assessment: 81 y.o. female admitted with CHF, NPO and unable to take apixaban, for Lovenox  Goal of Therapy:  Full anticoagulation with Lovenox Monitor platelets by anticoagulation protocol: Yes   Plan:  Lovenox 90 mg SQ q12h  Caryl Pina 01/20/2015,3:17 AM

## 2015-01-20 NOTE — Progress Notes (Signed)
[place] TEAM 1 - Stepdown/ICU TEAM PROGRESS NOTE  Elizabeth Pugh FUX:[id number] DOB: Sep 13, 1926 DOA: 01/19/2015 PCP: Philis Fendt, MD  Admit HPI / Brief Narrative: 81 y.o. female with h/o obesity, cva, chronic afib on apixaban, bedbound for three years due to chronic knee pain, who presented with sob. Patient had been c/o sob for a few days, and had noticed worsening of lower extremity edema. Daughter brought patient to the ED. In the ER, patient was found to be hypoxic to 85% on room air, Venous blood gas noted CO2 retention, cxr suggested pulmonary edema.  She was started on bipap and was given breathing treatments and lasix 20mg  x1.  HPI/Subjective: Pt seen for f/u visit.  Assessment/Plan:  Acute hypoxic and hypercarbic resp failure  Acute exacerbation diastolic CHF  No recent baseline wgt available - admit weight 90.7kg  Obesity hypoventilation syndrome   Chronic afib On chronic apixaban   DM2  Hx Seizure  Equivocal UA  MASD, specifically intertriginous dermatitis in the sub-pannicular skin fold  Code Status: FULL Family Communication: no family present at time of exam Disposition Plan: SDU  Consultants: none  Procedures: 9/18 - TTE - EF 55-60% - no WMA - high ventricular filling pressures   Antibiotics: none  DVT prophylaxis: apixaban   Objective: Blood pressure 152/90, pulse 78, temperature 98 F (36.7 C), temperature source Oral, resp. rate 15, height 5\' 7"  (1.702 m), weight 90.719 kg (200 lb), SpO2 97 %.  Intake/Output Summary (Last 24 hours) at 01/20/15 1605 Last data filed at 01/20/15 1330  Gross per 24 hour  Intake    210 ml  Output   1510 ml  Net  -1300 ml     Exam: Pt seen for f/u visit.  Data Reviewed: Basic Metabolic Panel:  Recent Labs Lab 01/19/15 2258 01/20/15 0820  NA 134* 141  K 4.6 4.2  CL 97* 101  CO2 29 35*  GLUCOSE 304* 187*  BUN 20 17  CREATININE 0.73 0.89  CALCIUM 8.6* 9.0  MG  --  1.8    CBC:  Recent  Labs Lab 01/19/15 2258 01/20/15 0820  WBC 3.3* 4.1  NEUTROABS 2.3  --   HGB 12.9 12.2  HCT 42.1 41.6  MCV 97.9 98.6  PLT 123* 120*    Liver Function Tests:  Recent Labs Lab 01/19/15 2258 01/20/15 0820  AST 18 17  ALT 21 19  ALKPHOS 56 50  BILITOT 1.0 1.0  PROT 6.6 6.3*  ALBUMIN 3.5 3.3*   CBG:  Recent Labs Lab 01/20/15 0501 01/20/15 0844 01/20/15 1330  GLUCAP 227* 170* 134*    Recent Results (from the past 240 hour(s))  MRSA PCR Screening     Status: None   Collection Time: 01/20/15  1:40 AM  Result Value Ref Range Status   MRSA by PCR NEGATIVE NEGATIVE Final    Comment:        The GeneXpert MRSA Assay (FDA approved for NASAL specimens only), is one component of a comprehensive MRSA colonization surveillance program. It is not intended to diagnose MRSA infection nor to guide or monitor treatment for MRSA infections.      Studies:   Recent x-ray studies have been reviewed in detail by the Attending Physician  Scheduled Meds:  Scheduled Meds: . enoxaparin (LOVENOX) injection  90 mg Subcutaneous BID  . furosemide  40 mg Intravenous BID  . [START ON 01/21/2015] guaiFENesin  600 mg Oral BID  . insulin aspart  0-20 Units Subcutaneous 6 times per  day  . ipratropium-albuterol  3 mL Nebulization Q6H  . levETIRAcetam  500 mg Intravenous Q12H  . metoprolol  2.5 mg Intravenous 3 times per day    Time spent on care of this patient: No charge   Cherene Altes , MD   [facility] Office  [phone] Pager - Text Page per Shea Evans as per below:  On-Call/Text Page:      [email or web address]      password [email or web address]  If 7PM-7AM, please contact night-coverage [email or web address] Password [email or web address] 01/20/2015, 4:05 PM   LOS: 0 days

## 2015-01-20 NOTE — Progress Notes (Signed)
ANTICOAGULATION CONSULT NOTE - Initial Consult  Pharmacy Consult for Apixaban Indication: atrial fibrillation  Allergies  Allergen Reactions  . Erythromycin Other (See Comments)    Vaginal itching  . Penicillins Rash  . Zithromax [Azithromycin] Other (See Comments)    gave her a yeast infection.    Patient Measurements: Height: 5\' 7"  (170.2 cm) Weight: 200 lb (90.719 kg) (unable to get an accurate weight due to being bed bound and on a bipap) IBW/kg (Calculated) : 61.6 Heparin Dosing Weight:   Vital Signs: Temp: 98 F (36.7 C) (09/18 1543) Temp Source: Oral (09/18 1543) BP: 152/90 mmHg (09/18 1543) Pulse Rate: 78 (09/18 1543)  Labs:  Recent Labs  01/19/15 2258 01/20/15 0820  HGB 12.9 12.2  HCT 42.1 41.6  PLT 123* 120*  CREATININE 0.73 0.89    Estimated Creatinine Clearance: 52.4 mL/min (by C-G formula based on Cr of 0.89).   Medical History: Past Medical History  Diagnosis Date  . Stroke   . GERD (gastroesophageal reflux disease)   . Hypertension   . Diabetes mellitus   . Blood transfusion   . Arthritis   . Anxiety   . Anemia   . GI (gastrointestinal bleed)     Due to benign mass  . Bronchitis   . Restless leg syndrome   . Atrial fibrillation   . Acute delirium 04/12/2011  . Seizures   . CHF (congestive heart failure)     Medications:  Prescriptions prior to admission  Medication Sig Dispense Refill Last Dose  . albuterol (PROVENTIL HFA;VENTOLIN HFA) 108 (90 BASE) MCG/ACT inhaler Inhale 1 puff into the lungs every 6 (six) hours as needed for wheezing or shortness of breath.   01/19/2015 at Unknown time  . amLODipine (NORVASC) 5 MG tablet Take 5 mg by mouth daily.   01/19/2015 at Unknown time  . apixaban (ELIQUIS) 2.5 MG TABS tablet Take 2.5 mg by mouth 2 (two) times daily.   01/19/2015 at Unknown time  . calcium-vitamin D (CALCIUM 500+D) 500-400 MG-UNIT per tablet Take 1 tablet by mouth 2 (two) times daily.   01/19/2015 at Unknown time  . carvedilol  (COREG CR) 20 MG 24 hr capsule Take 40 mg by mouth daily.    01/19/2015 at 0800  . doxepin (SINEQUAN) 25 MG capsule Take 25 mg by mouth at bedtime.   01/19/2015 at Unknown time  . esomeprazole (NEXIUM) 40 MG capsule Take 40 mg by mouth daily as needed (heart burn).    01/19/2015 at Unknown time  . ferrous sulfate 325 (65 FE) MG tablet Take 325 mg by mouth daily with breakfast.   01/19/2015 at Unknown time  . glimepiride (AMARYL) 2 MG tablet Take 2 mg by mouth daily before breakfast.   01/19/2015 at Unknown time  . hydrOXYzine (ATARAX/VISTARIL) 25 MG tablet Take 25 mg by mouth 2 (two) times daily as needed for itching.    01/19/2015 at Unknown time  . insulin aspart (NOVOLOG) 100 UNIT/ML injection Inject 1-20 Units into the skin 3 (three) times daily before meals. 1 unit for every 20 over glucose of 150   01/19/2015 at Unknown time  . levETIRAcetam (KEPPRA) 500 MG tablet Take 1 tablet (500 mg total) by mouth 2 (two) times daily. 30 tablet 2 01/19/2015 at Unknown time  . pregabalin (LYRICA) 75 MG capsule Take 75 mg by mouth 2 (two) times daily. For restless legs and nerve pain   01/19/2015 at Unknown time  . rOPINIRole (REQUIP) 2 MG tablet Take 2 mg  by mouth 4 (four) times daily.    01/19/2015 at Unknown time  . telmisartan (MICARDIS) 40 MG tablet Take 40 mg by mouth 2 (two) times daily.   01/19/2015 at Unknown time  . VOLTAREN 1 % GEL Apply 1 application topically 4 (four) times daily as needed (knee pain). For pain   01/19/2015 at Unknown time    Assessment: 81 yo female on Eliquis pta for AFib, patient has been receving Lovenox while inpatient and now will transition back to Eliquis. Pt is on 2.5 mg po bid, technically patient should receive 5 mg po bid, I verified with the outpatient pharmacy the patient is taking 2.5 mg po bid PTA. Last dose of Lovenox was given at 0500 this morning. CBC stable, borderline low platelets.  Goal of Therapy:  Monitor platelets by anticoagulation protocol: Yes   Plan:  -Stop  Lovenox -Restart Eliquis 2.5 mg po bid -Monitor s/sx bleeding    Hughes Better, PharmD, BCPS Clinical Pharmacist Pager: [phone] 01/20/2015 6:24 PM

## 2015-01-20 NOTE — ED Notes (Signed)
Daughter Elizabeth Pugh left for the night.  Numbers received if anything is needed from her.  Home phone [phone] and cell [phone].

## 2015-01-20 NOTE — Progress Notes (Signed)
*  PRELIMINARY RESULTS* Echocardiogram 2D Echocardiogram has been performed.  Leavy Cella 01/20/2015, 3:52 PM

## 2015-01-20 NOTE — H&P (Signed)
History and Physical  Elizabeth Pugh:[id number] DOB: Mar 13, 1927 DOA: 01/19/2015  Referring physician: EDP PCP: Philis Fendt, MD   Chief Complaint: sob HPI: Elizabeth Pugh is a 81 y.o. female   With h/o obesity, cva, chronic afib on apixaban, has been bedbound for the last three years due to chronic knee pain, presented with sob. Patient is currently on bipap, hpi obtained from talking to Elizabeth Pugh and patient's daughter who is currently at bedside. Per daughter patient has been c/o sob for the last few days, she has also noticed worsening of lower extremity edema, no chest pain, no fever, patient reported feeling congested but not able to cough up anything. Daughter brought patient to the ED due to worsening of symptom and patient seems more drowsy than usual. In the ER, patient was found to be hypoxic to 85% on room air, Venous blood gas with ok ph at 7.27, does has co2 retention, cxr + pulmonary edema, she was started on bipap and was given breathing treatment and lasix 20mg x1, hospitals[facility] called to admit the patient. Patient currently reported feeling better.  Review of Systems:  Detail per HPI, Review of systems are otherwise negative  Past Medical History  Diagnosis Date  . Stroke   . GERD (gastroesophageal reflux disease)   . Hypertension   . Diabetes mellitus   . Blood transfusion   . Arthritis   . Anxiety   . Anemia   . GI (gastrointestinal bleed)     Due to benign mass  . Bronchitis   . Restless leg syndrome   . Atrial fibrillation   . Acute delirium 04/12/2011  . Seizures   . CHF (congestive heart failure)    Past Surgical History  Procedure Laterality Date  . Eye surgery      Cataract removal  . Abdominal hysterectomy    . Tubal ligation    . Eus  09/24/2011    Procedure: UPPER ENDOSCOPIC ULTRASOUND (EUS) LINEAR;  Surgeon: Beryle Beams, MD;  Location: [facility];  Service: Endoscopy;  Laterality: N/A;   Social History:  reports that she quit smoking about  29 years ago. Her smoking use included Cigarettes. She has never used smokeless tobacco. She reports that she does not drink alcohol or use illicit drugs. Patient lives at home & bedbound  Allergies  Allergen Reactions  . Erythromycin Other (See Comments)    Vaginal itching  . Penicillins Rash  . Zithromax [Azithromycin] Other (See Comments)    gave her a yeast infection.    Family History  Problem Relation Age of Onset  . Aneurysm Mother   . Heart attack Father   . Heart failure Sister   . Asthma Sister   . Kidney failure Sister   . Stroke Sister       Prior to Admission medications   Medication Sig Start Date End Date Taking? Authorizing Provider  albuterol (PROVENTIL HFA;VENTOLIN HFA) 108 (90 BASE) MCG/ACT inhaler Inhale 1 puff into the lungs every 6 (six) hours as needed for wheezing or shortness of breath.   Yes Historical Provider, MD  amLODipine (NORVASC) 5 MG tablet Take 5 mg by mouth daily.   Yes Historical Provider, MD  apixaban (ELIQUIS) 2.5 MG TABS tablet Take 2.5 mg by mouth 2 (two) times daily.   Yes Historical Provider, MD  calcium-vitamin D (CALCIUM 500+D) 500-400 MG-UNIT per tablet Take 1 tablet by mouth 2 (two) times daily.   Yes Historical Provider, MD  carvedilol (COREG CR) 20 MG 24  hr capsule Take 40 mg by mouth daily.    Yes Historical Provider, MD  doxepin (SINEQUAN) 25 MG capsule Take 25 mg by mouth at bedtime.   Yes Historical Provider, MD  esomeprazole (NEXIUM) 40 MG capsule Take 40 mg by mouth daily as needed (heart burn).    Yes Historical Provider, MD  ferrous sulfate 325 (65 FE) MG tablet Take 325 mg by mouth daily with breakfast.   Yes Historical Provider, MD  glimepiride (AMARYL) 2 MG tablet Take 2 mg by mouth daily before breakfast.   Yes Historical Provider, MD  hydrOXYzine (ATARAX/VISTARIL) 25 MG tablet Take 25 mg by mouth 2 (two) times daily as needed for itching.    Yes Historical Provider, MD  insulin aspart (NOVOLOG) 100 UNIT/ML injection  Inject 1-20 Units into the skin 3 (three) times daily before meals. 1 unit for every 20 over glucose of 150   Yes Historical Provider, MD  levETIRAcetam (KEPPRA) 500 MG tablet Take 1 tablet (500 mg total) by mouth 2 (two) times daily. 02/27/12  Yes Monika Salk, MD  pregabalin (LYRICA) 75 MG capsule Take 75 mg by mouth 2 (two) times daily. For restless legs and nerve pain   Yes Historical Provider, MD  rOPINIRole (REQUIP) 2 MG tablet Take 2 mg by mouth 4 (four) times daily.    Yes Historical Provider, MD  telmisartan (MICARDIS) 40 MG tablet Take 40 mg by mouth 2 (two) times daily.   Yes Historical Provider, MD  VOLTAREN 1 % GEL Apply 1 application topically 4 (four) times daily as needed (knee pain). For pain 10/26/11  Yes Historical Provider, MD    Physical Exam: BP 129/67 mmHg  Pulse 75  Temp(Src) 98 F (36.7 C) (Oral)  Resp 14  SpO2 99%  General:  Obese, On bipap, reported feeling better, denies pain, aaox3, following commends Eyes: PERRL ENT: unremarkable Neck: supple, no JVD,  Cardiovascular: IRRR Respiratory: overall diminished, but no wheezing, no rales, no rhonchi Abdomen: soft/ND/ND, positive bowel sounds Skin: no rash, pannus skin fold breakdown, no sign of infection Musculoskeletal:  +pitting edema bilateral lower extremity Psychiatric: calm/cooperative Neurologic: no focal findings            Labs on Admission:  Basic Metabolic Panel:  Recent Labs Lab 01/19/15 2258  NA 134*  K 4.6  CL 97*  CO2 29  GLUCOSE 304*  BUN 20  CREATININE 0.73  CALCIUM 8.6*   Liver Function Tests:  Recent Labs Lab 01/19/15 2258  AST 18  ALT 21  ALKPHOS 56  BILITOT 1.0  PROT 6.6  ALBUMIN 3.5   No results for input(s): LIPASE, AMYLASE in the last 168 hours. No results for input(s): AMMONIA in the last 168 hours. CBC:  Recent Labs Lab 01/19/15 2258  WBC 3.3*  NEUTROABS 2.3  HGB 12.9  HCT 42.1  MCV 97.9  PLT 123*   Cardiac Enzymes: No results for input(s): CKTOTAL,  CKMB, CKMBINDEX, TROPONINI in the last 168 hours.  BNP (last 3 results)  Recent Labs  10/06/14 0340 01/19/15 2259  BNP 129.6* 180.3*    ProBNP (last 3 results) No results for input(s): PROBNP in the last 8760 hours.  CBG: No results for input(s): GLUCAP in the last 168 hours.  Radiological Exams on Admission: Dg Chest 2 View  01/20/2015   CLINICAL DATA:  Shortness of breath.  Crackles and pitting edema.  EXAM: CHEST  2 VIEW  COMPARISON:  Radiographs and CT 10/05/2014  FINDINGS: Loculated right pleural effusion,  unchanged from prior exam. Increased cardiomegaly. Lower lung volumes from prior. Peribronchial cuffing, suspect pulmonary edema. No pneumothorax. Diffuse bony under mineralization. Chronic widening of the left acromioclavicular joint.  IMPRESSION: 1. Increased cardiomegaly with peribronchial cuffing, suspicious for pulmonary edema. Findings consistent with CHF. 2. Partially loculated right pleural effusion, grossly unchanged.   Electronically Signed   By: Jeb Levering M.D.   On: 01/20/2015 00:16    EKG: Independently reviewed. Chronic afib  Assessment/Plan Present on Admission:  . Respiratory failure . Hypercapnemia  Sob/hypoxia /hypercapnea respiratory failure, likely combination of diastolic chf exacerbation and obesity hypoventilation syndrome, less likely copd, no wheezing on exam. Denies chest pain, troponin negative. Continue bipap/nebs/lasix. Echo pending. Admit to stepdown.  Chronic afib, on apixaban /coreg at home, currently rate controlled, need to hold oral meds due to npo, changed to lovenox therapeutic dose per pharmacy, change to iv lopressor 2.5mg  q8hrs, resume oral meds when able to wean off bipap.  IDDM2, check a1c, due to npo, will hold scheduled insulin, start ssi for now.  H/o seizure, no seizure here, change to iv keppra.  Skin breakdown around pannus fold, skin care per protocol, wound care consult.  FTT, baseline bedbound for the last three  years, will need discharge planning.  DVT prophylaxis: on apixaban at home for afib, changed to subQ lovenox therapeutic dose here due to npo on bipap  Consultants:  none  Code Status: full , confirmed with patient  Family Communication:  Patient and daughter  Disposition Plan: admit to stepdown, continue bipap  Time spent: 59mins  Xu,Fang MD, PhD [facility] Pager [phone] If 7PM-7AM, please contact night-coverage at [email or web address], password [facility]

## 2015-01-20 NOTE — Progress Notes (Signed)
Utilization Review Completed.Elizabeth Pugh T9/18/2016  

## 2015-01-20 NOTE — Consult Note (Signed)
WOC wound consult note Reason for Consult:MASD, specifically intertriginous dermatitis in the sub-pannicular skin fold Wound type:Moisture Pressure Ulcer POA: No Measurement: Length of sub-pannicular skin fold Wound bed: linear separation in the epidermis revealing pink, moist tissue. Drainage (amount, consistency, odor) scant serous Periwound:intact, dry Dressing procedure/placement/frequency: I have provided Nursing with guidance for the use of our house antimicrobial textile, InterDry Ag+ for the treatment of her moisture associated skin damage, specifically intertriginous dermatitis of the sub-pannicular skin fold. Patient's sister is provided with information on how to obtain this product locally if they are pleased with the results as the patient has this problem when not in acute care. [place] nursing team will not follow, but will remain available to this patient, the nursing and medical team.  Please re-consult if needed. Thanks, Maudie Flakes, MSN, RN, [place], [place], [place] [phone])

## 2015-01-21 DIAGNOSIS — J810 Acute pulmonary edema: Secondary | ICD-10-CM

## 2015-01-21 DIAGNOSIS — J9601 Acute respiratory failure with hypoxia: Secondary | ICD-10-CM

## 2015-01-21 DIAGNOSIS — I482 Chronic atrial fibrillation: Secondary | ICD-10-CM

## 2015-01-21 DIAGNOSIS — I5031 Acute diastolic (congestive) heart failure: Secondary | ICD-10-CM

## 2015-01-21 LAB — COMPREHENSIVE METABOLIC PANEL
ALK PHOS: 48 U/L (ref 38–126)
ALT: 15 U/L (ref 14–54)
ANION GAP: 6 (ref 5–15)
AST: 14 U/L — ABNORMAL LOW (ref 15–41)
Albumin: 3 g/dL — ABNORMAL LOW (ref 3.5–5.0)
BILIRUBIN TOTAL: 1.1 mg/dL (ref 0.3–1.2)
BUN: 16 mg/dL (ref 6–20)
CALCIUM: 8.6 mg/dL — AB (ref 8.9–10.3)
CO2: 36 mmol/L — ABNORMAL HIGH (ref 22–32)
Chloride: 98 mmol/L — ABNORMAL LOW (ref 101–111)
Creatinine, Ser: 0.87 mg/dL (ref 0.44–1.00)
GFR calc non Af Amer: 59 mL/min — ABNORMAL LOW (ref >60)
Glucose, Bld: 226 mg/dL — ABNORMAL HIGH (ref 65–99)
Potassium: 4.1 mmol/L (ref 3.5–5.1)
Sodium: 140 mmol/L (ref 135–145)
TOTAL PROTEIN: 5.2 g/dL — AB (ref 6.5–8.1)

## 2015-01-21 LAB — HEMOGLOBIN A1C
HEMOGLOBIN A1C: 8.9 % — AB (ref 4.8–5.6)
Mean Plasma Glucose: 209 mg/dL

## 2015-01-21 LAB — CBC
HCT: 40 % (ref 36.0–46.0)
HEMOGLOBIN: 12.3 g/dL (ref 12.0–15.0)
MCH: 29.6 pg (ref 26.0–34.0)
MCHC: 30.8 g/dL (ref 30.0–36.0)
MCV: 96.4 fL (ref 78.0–100.0)
Platelets: 122 10*3/uL — ABNORMAL LOW (ref 150–400)
RBC: 4.15 MIL/uL (ref 3.87–5.11)
RDW: 14.5 % (ref 11.5–15.5)
WBC: 2.9 10*3/uL — ABNORMAL LOW (ref 4.0–10.5)

## 2015-01-21 LAB — GLUCOSE, CAPILLARY
GLUCOSE-CAPILLARY: 177 mg/dL — AB (ref 65–99)
GLUCOSE-CAPILLARY: 222 mg/dL — AB (ref 65–99)
GLUCOSE-CAPILLARY: 145 mg/dL — AB (ref 65–99)
Glucose-Capillary: 205 mg/dL — ABNORMAL HIGH (ref 65–99)

## 2015-01-21 MED ORDER — INSULIN GLARGINE 100 UNIT/ML ~~LOC~~ SOLN
10.0000 [IU] | Freq: Every day | SUBCUTANEOUS | Status: DC
  Administered 2015-01-21: 10 [IU] via SUBCUTANEOUS
  Filled 2015-01-21 (×2): qty 0.1

## 2015-01-21 MED ORDER — GUAIFENESIN ER 600 MG PO TB12: 600.0000 mg | ORAL_TABLET | Freq: Two times a day (BID) | ORAL | Status: DC | PRN

## 2015-01-21 NOTE — Progress Notes (Signed)
[place] TEAM 1 - Stepdown/ICU TEAM PROGRESS NOTE  Elizabeth Pugh KGU:[id number] DOB: Aug 05, 1926 DOA: 01/19/2015 PCP: Philis Fendt, MD  Admit HPI / Brief Narrative: 81 y.o. female with h/o obesity, cva, chronic afib on apixaban, bedbound for three years due to chronic knee pain, who presented with sob. Patient had been c/o sob for a few days, and had noticed worsening of lower extremity edema. Daughter brought patient to the ED. In the ER, patient was found to be hypoxic to 85% on room air, Venous blood gas noted CO2 retention, cxr suggested pulmonary edema.  She was started on bipap and was given breathing treatments and lasix 20mg  x1.  HPI/Subjective: The patient is alert and interactive today.  She appears to have rapidly rebounded to her baseline mental status.  She continues to require moderate amount of oxygen though she is not on oxygen at home.  She denies chest pain shortness of breath fevers chills nausea or vomiting.  I spoke with her daughter at the bedside at length who is a delightful person and has been taking exceptional care of her mother at home.  Assessment/Plan:  Acute hypoxic and hypercarbic resp failure Appears to be driven by pulmonary edema - much improved at present - does not require oxygen at home - wean oxygen as able   Acute exacerbation diastolic CHF  No recent baseline wgt available - admit weight 90.7kg - wgt today clearly not accurate (127kg) - net negative ~6.4L thus far today - attempting to obtain accurate daily weights - continue gentle diuresis  Obesity hypoventilation syndrome  This is reportedly patient's history but it's not clear to me that there has been a formal diagnosis - the patient has never been screened for sleep apnea but given her advanced age it's unclear to me that it would benefit her to do so - we will follow her mental status with treatment of above  Chronic afib On chronic apixaban - rate well controlled  DM2 CBGs reasonably  control but creeping upward - follow and adjust treatment as indicated  Hx Seizure  Appears to be quiescent  Equivocal UA the patient is afebrile with a normal white blood cell count therefore I do not feel that she has a UTI  MASD, specifically intertriginous dermatitis in the sub-pannicular skin folds  Code Status: FULL Family Communication: Spoke with the patient's daughter at bedside at length Disposition Plan: Anticipate 2-3 days additional hospital stay - continue diuresis - wean to room air as able - ultimate plan to return home with daughter  Consultants: none  Procedures: 9/18 - TTE - EF 55-60% - no WMA - high ventricular filling pressures   Antibiotics: none  DVT prophylaxis: apixaban   Objective: Blood pressure 107/89, pulse 77, temperature 98.9 F (37.2 C), temperature source Oral, resp. rate 13, height 5\' 7"  (1.702 m), weight 127 kg (279 lb 15.8 oz), SpO2 95 %.  Intake/Output Summary (Last 24 hours) at 01/21/15 1653 Last data filed at 01/21/15 1400  Gross per 24 hour  Intake    480 ml  Output   5550 ml  Net  -5070 ml   Exam: General: No acute respiratory distress - alert and conversant Lungs: Distant breath sounds in all fields with mild bibasilar crackles without wheeze Cardiovascular: Regular rate without murmur gallop or rub normal S1 and S2 Abdomen: Nontender, nondistended, soft, bowel sounds positive, no rebound, no ascites, no appreciable mass Extremities: No significant cyanosis, clubbing;  trace edema bilateral lower extremities   Data  Reviewed: Basic Metabolic Panel:  Recent Labs Lab 01/19/15 2258 01/20/15 0820 01/21/15 0605  NA 134* 141 140  K 4.6 4.2 4.1  CL 97* 101 98*  CO2 29 35* 36*  GLUCOSE 304* 187* 226*  BUN 20 17 16   CREATININE 0.73 0.89 0.87  CALCIUM 8.6* 9.0 8.6*  MG  --  1.8  --     CBC:  Recent Labs Lab 01/19/15 2258 01/20/15 0820 01/21/15 0006  WBC 3.3* 4.1 2.9*  NEUTROABS 2.3  --   --   HGB 12.9 12.2 12.3    HCT 42.1 41.6 40.0  MCV 97.9 98.6 96.4  PLT 123* 120* 122*    Liver Function Tests:  Recent Labs Lab 01/19/15 2258 01/20/15 0820 01/21/15 0605  AST 18 17 14*  ALT 21 19 15   ALKPHOS 56 50 48  BILITOT 1.0 1.0 1.1  PROT 6.6 6.3* 5.2*  ALBUMIN 3.5 3.3* 3.0*   CBG:  Recent Labs Lab 01/20/15 1547 01/20/15 2017 01/21/15 0713 01/21/15 1240 01/21/15 1649  GLUCAP 121* 99 177* 222* 205*    Recent Results (from the past 240 hour(s))  MRSA PCR Screening     Status: None   Collection Time: 01/20/15  1:40 AM  Result Value Ref Range Status   MRSA by PCR NEGATIVE NEGATIVE Final    Comment:        The GeneXpert MRSA Assay (FDA approved for NASAL specimens only), is one component of a comprehensive MRSA colonization surveillance program. It is not intended to diagnose MRSA infection nor to guide or monitor treatment for MRSA infections.      Studies:   Recent x-ray studies have been reviewed in detail by the Attending Physician  Scheduled Meds:  Scheduled Meds: . apixaban  2.5 mg Oral BID  . carvedilol  40 mg Oral Daily  . doxepin  25 mg Oral QHS  . furosemide  40 mg Intravenous BID  . guaiFENesin  600 mg Oral BID  . insulin aspart  0-20 Units Subcutaneous TID WC  . ipratropium-albuterol  3 mL Nebulization QID  . levETIRAcetam  500 mg Oral BID  . rOPINIRole  2 mg Oral QID    Time spent on care of this patient: 35 mins   MCCLUNG,JEFFREY T , MD   [facility] Office  [phone] Pager - Text Page per Shea Evans as per below:  On-Call/Text Page:      [email or web address]      password [email or web address]  If 7PM-7AM, please contact night-coverage [email or web address] Password [email or web address] 01/21/2015, 4:53 PM   LOS: 1 day

## 2015-01-22 ENCOUNTER — Inpatient Hospital Stay (HOSPITAL_COMMUNITY): Payer: Medicare Other

## 2015-01-22 DIAGNOSIS — J9601 Acute respiratory failure with hypoxia: Secondary | ICD-10-CM | POA: Diagnosis present

## 2015-01-22 DIAGNOSIS — E1165 Type 2 diabetes mellitus with hyperglycemia: Secondary | ICD-10-CM | POA: Diagnosis present

## 2015-01-22 DIAGNOSIS — IMO0002 Reserved for concepts with insufficient information to code with codable children: Secondary | ICD-10-CM | POA: Diagnosis present

## 2015-01-22 DIAGNOSIS — J9602 Acute respiratory failure with hypercapnia: Secondary | ICD-10-CM

## 2015-01-22 DIAGNOSIS — G40909 Epilepsy, unspecified, not intractable, without status epilepticus: Secondary | ICD-10-CM | POA: Diagnosis present

## 2015-01-22 DIAGNOSIS — I482 Chronic atrial fibrillation, unspecified: Secondary | ICD-10-CM | POA: Diagnosis present

## 2015-01-22 DIAGNOSIS — I5033 Acute on chronic diastolic (congestive) heart failure: Secondary | ICD-10-CM | POA: Diagnosis present

## 2015-01-22 DIAGNOSIS — E662 Morbid (severe) obesity with alveolar hypoventilation: Secondary | ICD-10-CM | POA: Diagnosis present

## 2015-01-22 LAB — BASIC METABOLIC PANEL
Anion gap: 7 (ref 5–15)
BUN: 17 mg/dL (ref 6–20)
CHLORIDE: 95 mmol/L — AB (ref 101–111)
CO2: 38 mmol/L — ABNORMAL HIGH (ref 22–32)
Calcium: 8.7 mg/dL — ABNORMAL LOW (ref 8.9–10.3)
Creatinine, Ser: 0.93 mg/dL (ref 0.44–1.00)
GFR calc Af Amer: >60 mL/min (ref >60)
GFR calc non Af Amer: 54 mL/min — ABNORMAL LOW (ref >60)
Glucose, Bld: 155 mg/dL — ABNORMAL HIGH (ref 65–99)
POTASSIUM: 3.8 mmol/L (ref 3.5–5.1)
SODIUM: 140 mmol/L (ref 135–145)

## 2015-01-22 LAB — GLUCOSE, CAPILLARY
GLUCOSE-CAPILLARY: 170 mg/dL — AB (ref 65–99)
GLUCOSE-CAPILLARY: 167 mg/dL — AB (ref 65–99)
Glucose-Capillary: 152 mg/dL — ABNORMAL HIGH (ref 65–99)
Glucose-Capillary: 204 mg/dL — ABNORMAL HIGH (ref 65–99)

## 2015-01-22 MED ORDER — INSULIN GLARGINE 100 UNIT/ML ~~LOC~~ SOLN
16.0000 [IU] | Freq: Every day | SUBCUTANEOUS | Status: DC
  Administered 2015-01-22: 16 [IU] via SUBCUTANEOUS
  Filled 2015-01-22 (×2): qty 0.16

## 2015-01-22 NOTE — Progress Notes (Signed)
Patient is currently on [facility] and is in no distress. BIPAP is not needed at this time.

## 2015-01-22 NOTE — Progress Notes (Signed)
[place] TEAM 1 - Stepdown/ICU TEAM Progress Note  LOWELLA KINDLEY PPI:[id number] DOB: 12-06-1926 DOA: 01/19/2015 PCP: Philis Fendt, MD  Admit HPI / Brief Narrative: [name] PMHx MORBID obesity, cva, chronic afib on apixaban, bedbound for three years due to chronic knee pain,   Presented with sob. Patient had been c/o sob for a few days, and had noticed worsening of lower extremity edema. Daughter brought patient to the ED. In the ER, patient was found to be hypoxic to 85% on room air, Venous blood gas noted CO2 retention, cxr suggested pulmonary edema. She was started on bipap and was given breathing treatments and lasix 20mg  x1.   HPI/Subjective: 9/20 A/O 4, NAD. Patient does not know base weight because only gets out of bed once a week. With help of daughter and grandson.   Assessment/Plan: Acute respiratory failure with hypoxia and hypercapnia -Appears to be driven by pulmonary edema - much improved at present  - does not require oxygen at home. (Patient inactive lays in bed all day) - wean oxygen as able   Acute on chronic diastolic CHF -No recent baseline wgt available - admit weight 90.7kg - wgt today clearly not accurate (127kg)  - Strict in and out since admission; -11.1 L -Continue Lasix 40 mg BID -Daily weight -Out of bed to chair daily -PT/OT consulted  Obesity hypoventilation syndrome  -ABG in a.m. will verify if patient retaining CO2 -This is reportedly patient's history but it's not clear to me that there has been a formal diagnosis - the patient has never been screened for sleep apnea but given her advanced age it's unclear to me that it would benefit her to do so - we will follow her mental status with treatment of above  Chronic afib -On chronic apixaban - rate well controlled  DM Type2 uncontrolled -9/18 hemoglobin A1c= 8.9 -Increase Lantus to 16 units daily -Continue resistant SSI  Seizure  Appears to be quiescent  Equivocal UA -the patient is  afebrile with a normal white blood cell count therefore I do not feel that she has a UTI  MASD, specifically intertriginous dermatitis in the sub-pannicular skin folds   Code Status: FULL Family Communication: no family present at time of exam Disposition Plan: SNF    Consultants:   Procedure/Significant Events: 9/18 echocardiogram;- Left ventricle: mild concentric hypertrophy.  -LVEF=55% - 60%.  -. Doppler parameters are consistent with highventricular filling pressure. - Mitral valve: There was trivial regurgitation.   Culture   Antibiotics:   DVT prophylaxis: apixaban   Devices    LINES / TUBES:      Continuous Infusions:   Objective: VITAL SIGNS: Temp: 98 F (36.7 C) (09/20 2010) Temp Source: Oral (09/20 2010) BP: 118/88 mmHg (09/20 2010) Pulse Rate: 87 (09/20 2010) SPO2; FIO2:   Intake/Output Summary (Last 24 hours) at 01/22/15 2114 Last data filed at 01/22/15 2014  Gross per 24 hour  Intake    600 ml  Output   5425 ml  Net  -4825 ml     Exam: General: A/O 4, NAD, No acute respiratory distress Eyes: Negative headache,negative scleral hemorrhage ENT: Negative Runny nose, negative ear pain, negative gingival bleeding Neck:  Negative scars, masses, torticollis, lymphadenopathy, JVD Lungs: Clear to auscultation bilaterally without wheezes or crackles Cardiovascular: Regular rate and rhythm without murmur gallop or rub normal S1 and S2 Abdomen:negative abdominal pain, nondistended, positive soft, bowel sounds, no rebound, no ascites, no appreciable mass Extremities: No significant cyanosis, clubbing, or edema bilateral  lower extremities Psychiatric:  Negative depression, negative anxiety, negative fatigue, negative mania  Neurologic:  Cranial nerves II through XII intact, tongue/uvula midline, all extremities muscle strength 5/5, sensation intact throughout, unable to further assess secondary to patient being bedbound  Data Reviewed: Basic  Metabolic Panel:  Recent Labs Lab 01/19/15 2258 01/20/15 0820 01/21/15 0605 01/22/15 0048  NA 134* 141 140 140  K 4.6 4.2 4.1 3.8  CL 97* 101 98* 95*  CO2 29 35* 36* 38*  GLUCOSE 304* 187* 226* 155*  BUN 20 17 16 17   CREATININE 0.73 0.89 0.87 0.93  CALCIUM 8.6* 9.0 8.6* 8.7*  MG  --  1.8  --   --    Liver Function Tests:  Recent Labs Lab 01/19/15 2258 01/20/15 0820 01/21/15 0605  AST 18 17 14*  ALT 21 19 15   ALKPHOS 56 50 48  BILITOT 1.0 1.0 1.1  PROT 6.6 6.3* 5.2*  ALBUMIN 3.5 3.3* 3.0*   No results for input(s): LIPASE, AMYLASE in the last 168 hours. No results for input(s): AMMONIA in the last 168 hours. CBC:  Recent Labs Lab 01/19/15 2258 01/20/15 0820 01/21/15 0006  WBC 3.3* 4.1 2.9*  NEUTROABS 2.3  --   --   HGB 12.9 12.2 12.3  HCT 42.1 41.6 40.0  MCV 97.9 98.6 96.4  PLT 123* 120* 122*   Cardiac Enzymes: No results for input(s): CKTOTAL, CKMB, CKMBINDEX, TROPONINI in the last 168 hours. BNP (last 3 results)  Recent Labs  10/06/14 0340 01/19/15 2259  BNP 129.6* 180.3*    ProBNP (last 3 results) No results for input(s): PROBNP in the last 8760 hours.  CBG:  Recent Labs Lab 01/21/15 1649 01/21/15 2213 01/22/15 0613 01/22/15 1201 01/22/15 1716  GLUCAP 205* 145* 170* 167* 152*    Recent Results (from the past 240 hour(s))  MRSA PCR Screening     Status: None   Collection Time: 01/20/15  1:40 AM  Result Value Ref Range Status   MRSA by PCR NEGATIVE NEGATIVE Final    Comment:        The GeneXpert MRSA Assay (FDA approved for NASAL specimens only), is one component of a comprehensive MRSA colonization surveillance program. It is not intended to diagnose MRSA infection nor to guide or monitor treatment for MRSA infections.      Studies:  Recent x-ray studies have been reviewed in detail by the Attending Physician  Scheduled Meds:  Scheduled Meds: . apixaban  2.5 mg Oral BID  . carvedilol  40 mg Oral Daily  . doxepin  25  mg Oral QHS  . furosemide  40 mg Intravenous BID  . insulin aspart  0-20 Units Subcutaneous TID WC  . insulin glargine  16 Units Subcutaneous QHS  . ipratropium-albuterol  3 mL Nebulization QID  . levETIRAcetam  500 mg Oral BID  . rOPINIRole  2 mg Oral QID    Time spent on care of this patient: 40 mins   WOODS, Geraldo Docker , MD  [facility] Office  [phone] Pager - [phone]  On-Call/Text Page:      [email or web address]      password [email or web address]  If 7PM-7AM, please contact night-coverage [email or web address] Password [email or web address] 01/22/2015, 9:14 PM   LOS: 2 days   Care during the described time interval was provided by me .  I have reviewed this patient's available data, including medical history, events of note, physical examination, and all test results as part of my evaluation. I have personally reviewed and interpreted  all radiology studies.   Dia Crawford, MD [phone] Pager

## 2015-01-23 DIAGNOSIS — I5033 Acute on chronic diastolic (congestive) heart failure: Principal | ICD-10-CM

## 2015-01-23 DIAGNOSIS — E662 Morbid (severe) obesity with alveolar hypoventilation: Secondary | ICD-10-CM

## 2015-01-23 DIAGNOSIS — R569 Unspecified convulsions: Secondary | ICD-10-CM

## 2015-01-23 DIAGNOSIS — R0689 Other abnormalities of breathing: Secondary | ICD-10-CM

## 2015-01-23 DIAGNOSIS — E1165 Type 2 diabetes mellitus with hyperglycemia: Secondary | ICD-10-CM

## 2015-01-23 LAB — BASIC METABOLIC PANEL
ANION GAP: 6 (ref 5–15)
BUN: 20 mg/dL (ref 6–20)
CALCIUM: 8.6 mg/dL — AB (ref 8.9–10.3)
CO2: 38 mmol/L — AB (ref 22–32)
CREATININE: 1.11 mg/dL — AB (ref 0.44–1.00)
Chloride: 93 mmol/L — ABNORMAL LOW (ref 101–111)
GFR calc Af Amer: 51 mL/min — ABNORMAL LOW (ref >60)
GFR, EST NON AFRICAN AMERICAN: 44 mL/min — AB (ref >60)
GLUCOSE: 230 mg/dL — AB (ref 65–99)
Potassium: 4 mmol/L (ref 3.5–5.1)
Sodium: 137 mmol/L (ref 135–145)

## 2015-01-23 LAB — MAGNESIUM: Magnesium: 1.8 mg/dL (ref 1.7–2.4)

## 2015-01-23 LAB — GLUCOSE, CAPILLARY
GLUCOSE-CAPILLARY: 178 mg/dL — AB (ref 65–99)
GLUCOSE-CAPILLARY: 144 mg/dL — AB (ref 65–99)
GLUCOSE-CAPILLARY: 226 mg/dL — AB (ref 65–99)
Glucose-Capillary: 224 mg/dL — ABNORMAL HIGH (ref 65–99)

## 2015-01-23 LAB — BLOOD GAS, ARTERIAL
ACID-BASE EXCESS: 11.3 mmol/L — AB (ref 0.0–2.0)
Bicarbonate: 36.1 mEq/L — ABNORMAL HIGH (ref 20.0–24.0)
Drawn by: 419771
FIO2: 0.28
O2 Saturation: 95 %
PATIENT TEMPERATURE: 98.6
PCO2 ART: 54.5 mmHg — AB (ref 35.0–45.0)
PH ART: 7.437 (ref 7.350–7.450)
PO2 ART: 75 mmHg — AB (ref 80.0–100.0)
TCO2: 37.8 mmol/L (ref 0–100)

## 2015-01-23 MED ORDER — INSULIN GLARGINE 100 UNIT/ML ~~LOC~~ SOLN
20.0000 [IU] | Freq: Every day | SUBCUTANEOUS | Status: DC
  Administered 2015-01-23: 20 [IU] via SUBCUTANEOUS
  Filled 2015-01-23 (×2): qty 0.2

## 2015-01-23 MED ORDER — FUROSEMIDE 10 MG/ML IJ SOLN
20.0000 mg | Freq: Two times a day (BID) | INTRAMUSCULAR | Status: DC
  Administered 2015-01-23 – 2015-01-25 (×4): 20 mg via INTRAVENOUS
  Filled 2015-01-23 (×3): qty 2

## 2015-01-23 MED ORDER — CARVEDILOL PHOSPHATE ER 20 MG PO CP24
20.0000 mg | ORAL_CAPSULE | Freq: Every day | ORAL | Status: DC
  Administered 2015-01-24 – 2015-01-25 (×2): 20 mg via ORAL
  Filled 2015-01-23 (×2): qty 1

## 2015-01-23 NOTE — Care Management Important Message (Signed)
Important Message  Patient Details  Name: Elizabeth Pugh MRN: [id number] Date of Birth: [birth year]   Medicare Important Message Given:  Yes-second notification given    Loann Quill 01/23/2015, 10:53 AM

## 2015-01-23 NOTE — Progress Notes (Signed)
[place] TEAM 1 - Stepdown/ICU TEAM PROGRESS NOTE  Elizabeth Pugh TKW:[id number] DOB: [birth year] DOA: 01/19/2015 PCP: Philis Fendt, MD  Admit HPI / Brief Narrative: 81 y.o. female with h/o obesity, cva, chronic afib on apixaban, bedbound for three years due to chronic knee pain, who presented with sob. Patient had been c/o sob for a few days, and had noticed worsening of lower extremity edema. Daughter brought patient to the ED. In the ER, patient was found to be hypoxic to 85% on room air, Venous blood gas noted CO2 retention, cxr suggested pulmonary edema.  She was started on bipap and was given breathing treatments and lasix 20mg  x1.  HPI/Subjective: Pt is in good spirits and conversant.  She denies chest pain, n/v, abdom pain, f/c, or sob.  She has noted significant decreased swelling in her legs.  She has no new complaints.  Her family is not present today.    Assessment/Plan:  Acute hypoxic and hypercarbic resp failure Appears to have been driven by pulmonary edema - does not require oxygen at home - wean oxygen as able - now on 2L [place] only - need to assess sats on RA and w/ ambulation   Acute exacerbation diastolic CHF  No recent baseline wgt available - admit weight inaccurate at 90.7kg - wgt peak 128.3kg > currently 123.9kg - net negative ~12.5L thus far - crt beginning to climb and BP somewhat low today so will decrease dose of diuretic   Obesity hypoventilation syndrome  This is reportedly patient's history but it's not clear to me that there has been a formal diagnosis - the patient has never been screened for sleep apnea but given her advanced age it's unclear to me that it would benefit her to do so - we will follow her mental status with treatment of above - an ABG this morning suggests chronic well compensated CO2 retention - outpt sleep study could be considered if family desires CPAP   Chronic afib On chronic apixaban - rate well controlled  DM2 CBGs not at [place] -  insulin adjusted yesterday - follow trend w/o change today - A1c 8.9  Hx Seizure  Appears to be quiescent  Equivocal UA the patient is afebrile with a normal white blood cell count therefore I do not feel that she has a UTI  MASD, specifically intertriginous dermatitis in the sub-pannicular skin folds  Code Status: FULL Family Communication: no family present at time of exam today  Disposition Plan: Anticipate 2-3 days additional hospital stay - continue diuresis - wean to room air as able - PT/OT evals pending to determine best venue for d/c   Consultants: none  Procedures: 9/18 - TTE - EF 55-60% - no WMA - high ventricular filling pressures   Antibiotics: none  DVT prophylaxis: apixaban   Objective: Blood pressure 94/52, pulse 94, temperature 98.2 F (36.8 C), temperature source Oral, resp. rate 14, height 5\' 7"  (1.702 m), weight 123.9 kg (273 lb 2.4 oz), SpO2 91 %.  Intake/Output Summary (Last 24 hours) at 01/23/15 1138 Last data filed at 01/23/15 1033  Gross per 24 hour  Intake    657 ml  Output   3300 ml  Net  -2643 ml   Exam: General: No acute respiratory distress - alert and conversant - pleasant  Lungs: Distant breath sounds in all fields - no crackles  Cardiovascular: Regular rate without murmur gallop or rub  Abdomen: Nontender, nondistended, soft, bowel sounds positive Extremities: No significant cyanosis, clubbing;  trace  edema bilateral lower extremities    Data Reviewed: Basic Metabolic Panel:  Recent Labs Lab 01/19/15 2258 01/20/15 0820 01/21/15 0605 01/22/15 0048 01/23/15 0326  NA 134* 141 140 140 137  K 4.6 4.2 4.1 3.8 4.0  CL 97* 101 98* 95* 93*  CO2 29 35* 36* 38* 38*  GLUCOSE 304* 187* 226* 155* 230*  BUN 20 17 16 17 20   CREATININE 0.73 0.89 0.87 0.93 1.11*  CALCIUM 8.6* 9.0 8.6* 8.7* 8.6*  MG  --  1.8  --   --  1.8    CBC:  Recent Labs Lab 01/19/15 2258 01/20/15 0820 01/21/15 0006  WBC 3.3* 4.1 2.9*  NEUTROABS 2.3  --   --    HGB 12.9 12.2 12.3  HCT 42.1 41.6 40.0  MCV 97.9 98.6 96.4  PLT 123* 120* 122*    Liver Function Tests:  Recent Labs Lab 01/19/15 2258 01/20/15 0820 01/21/15 0605  AST 18 17 14*  ALT 21 19 15   ALKPHOS 56 50 48  BILITOT 1.0 1.0 1.1  PROT 6.6 6.3* 5.2*  ALBUMIN 3.5 3.3* 3.0*   CBG:  Recent Labs Lab 01/22/15 0613 01/22/15 1201 01/22/15 1716 01/22/15 2112 01/23/15 0830  GLUCAP 170* 167* 152* 204* 224*    Recent Results (from the past 240 hour(s))  MRSA PCR Screening     Status: None   Collection Time: 01/20/15  1:40 AM  Result Value Ref Range Status   MRSA by PCR NEGATIVE NEGATIVE Final    Comment:        The GeneXpert MRSA Assay (FDA approved for NASAL specimens only), is one component of a comprehensive MRSA colonization surveillance program. It is not intended to diagnose MRSA infection nor to guide or monitor treatment for MRSA infections.      Studies:   Recent x-ray studies have been reviewed in detail by the Attending Physician  Scheduled Meds:  Scheduled Meds: . apixaban  2.5 mg Oral BID  . carvedilol  40 mg Oral Daily  . doxepin  25 mg Oral QHS  . furosemide  40 mg Intravenous BID  . insulin aspart  0-20 Units Subcutaneous TID WC  . insulin glargine  16 Units Subcutaneous QHS  . ipratropium-albuterol  3 mL Nebulization QID  . levETIRAcetam  500 mg Oral BID  . rOPINIRole  2 mg Oral QID    Time spent on care of this patient: 35 mins   MCCLUNG,JEFFREY T , MD   [facility] Office  [phone] Pager - Text Page per Shea Evans as per below:  On-Call/Text Page:      [email or web address]      password [email or web address]  If 7PM-7AM, please contact night-coverage [email or web address] Password [email or web address] 01/23/2015, 11:38 AM   LOS: 3 days

## 2015-01-23 NOTE — Clinical Social Work Placement (Signed)
   CLINICAL SOCIAL WORK PLACEMENT  NOTE  Date:  01/23/2015  Patient Details  Name: GERALDYNE BARRACLOUGH MRN: [id number] Date of Birth: 1926/07/23  Clinical Social Work is seeking post-discharge placement for this patient at the [place] level of care (*CSW will initial, date and re-position this form in  chart as items are completed):  Yes   Patient/family provided with [place] Work Department's list of facilities offering this level of care within the geographic area requested by the patient (or if unable, by the patient's family).  Yes   Patient/family informed of their freedom to choose among providers that offer the needed level of care, that participate in Medicare, Medicaid or managed care program needed by the patient, have an available bed and are willing to accept the patient.  Yes   Patient/family informed of [place]'s ownership interest in [facility] and [facility], as well as of the fact that they are under no obligation to receive care at these facilities.  PASRR submitted to EDS on 01/23/15     PASRR number received on 01/23/15     Existing PASRR number confirmed on       FL2 transmitted to all facilities in geographic area requested by pt/family on 01/23/15     FL2 transmitted to all facilities within larger geographic area on       Patient informed that his/her managed care company has contracts with or will negotiate with certain facilities, including the following:            Patient/family informed of bed offers received.  Patient chooses bed at       Physician recommends and patient chooses bed at      Patient to be transferred to   on  .  Patient to be transferred to facility by       Patient family notified on   of transfer.  Name of family member notified:        PHYSICIAN Please sign FL2     Additional Comment:    _______________________________________________ Cranford Mon, LCSW 01/23/2015,  3:43 PM

## 2015-01-23 NOTE — Evaluation (Signed)
Physical Therapy Evaluation Patient Details Name: Elizabeth Pugh MRN: [id number] DOB: 03-Sep-1926 Today's Date: 01/23/2015   History of Present Illness  81 y.o. female with h/o obesity, CVA, chronic AFib on apixaban, has been bedbound for the last three years due to chronic knee pain, presented with sob. Patient is currently on BIPAP, history obtained from talking to [place] and patient's daughter. Per daughter patient has been c/o sob for the last few days, she has also noticed worsening of lower extremity edema. Daughter brought patient to the ED due to worsening of symptom and patient seems more drowsy than usual.  Clinical Impression  Patient in bed, eager to participate in PT today. Patient is severely limited in strength and mobility due to being bed bound for "a very long time" (3 years per daughter's ED report). She reports that her caregivers are unable to assist her at this time, and she has a goal to eventually be able to get OOB without assistance, saying "if I could just get to my chair everything would be much better." She is aware of how weak she is, eager to work towards better mobility and strength. Patient was thoroughly educated on need for continued, aggressive rehab and exercise participation on a regular basis to progress patient towards EOB and beyond. She will benefit from continued PT to address her goals of modified independent bed mobility and transfers.    Follow Up Recommendations SNF;Supervision/Assistance - 24 hour    Equipment Recommendations  Other (comment) (TBA as patient progresses)    Recommendations for Other Services       Precautions / Restrictions Precautions Precautions: Other (comment) (Bedbound past 3 years per daughter.) Restrictions Weight Bearing Restrictions: No      Mobility  Bed Mobility Overal bed mobility: Needs Assistance             General bed mobility comments: Bed level evaluation and exercises given - patient did not want to  get EOB today due to fatigue.  Transfers                    Ambulation/Gait                Stairs            Wheelchair Mobility    Modified Rankin (Stroke Patients Only)       Balance                                             Pertinent Vitals/Pain Pain Assessment: No/denies pain    Home Living Family/patient expects to be discharged to:: Private residence Living Arrangements: Alone Available Help at Discharge: Family;Available 24 hours/day Type of Home: [facility] bed;Transport chair;Electric scooter;Bedside commode;Other (comment) (Trapeeze bar for bed, Hoyer lift) Additional Comments: Patient's daughter and grandson able to come by on a regular basis to help bathe and change patient - she wears a diaper during the day for toileting. Grandson used to help get her OOB on the weekends, but she reports that her family is unable to do so at this point. Repeatedly mentions that daughter needs back surgery.    Prior Function Level of Independence: Needs assistance   Gait / Transfers Assistance Needed: Total assist  ADL's / Homemaking Assistance Needed: Total assist for bathing, dressing, toileting.  Hand Dominance   Dominant Hand: Right    Extremity/Trunk Assessment   Upper Extremity Assessment: Generalized weakness           Lower Extremity Assessment: Generalized weakness (Extreme weakness and severely decreased ROM.)         Communication   Communication: No difficulties  Cognition Arousal/Alertness: Awake/alert Behavior During Therapy: WFL for tasks assessed/performed Overall Cognitive Status: Within Functional Limits for tasks assessed                      General Comments General comments (skin integrity, edema, etc.): Bil LE very swollen, patient with severely decreased strength and ROM.    Exercises General Exercises - Upper Extremity Shoulder Flexion:  AROM;Both;10 reps;Supine (HOB elevated) General Exercises - Lower Extremity Ankle Circles/Pumps: AROM;Both;20 reps;Supine Quad Sets: AROM;Both;20 reps;Supine Short Arc Quad: AROM;Both;20 reps;Supine (Very limited ROM.) Hip ABduction/ADduction: AROM;Both;10 reps;Supine (Isometric pillow squeezes) Other Exercises Other Exercises: Pull-to-sit - patient use bed rails to pull self to long sitting, as much as possible, for 5 reps. Other Exercises: Trunk rolling - patient reached arms across body attempting to grab bed rails to practice rolling and grabbing for future rolling.      Assessment/Plan    PT Assessment Patient needs continued PT services  PT Diagnosis Generalized weakness;Other (comment) (Severe weakness due to bedbound past 3 years.)   PT Problem List Decreased strength;Decreased range of motion;Decreased activity tolerance;Decreased mobility;Obesity  PT Treatment Interventions DME instruction;Gait training;Functional mobility training;Therapeutic activities;Therapeutic exercise;Balance training;Patient/family education;Wheelchair mobility training;Modalities   PT Goals (Current goals can be found in the Care Plan section) Acute Rehab PT Goals Patient Stated Goal: Be able to get out of bed and to her chair without someone helping. PT Goal Formulation: With patient Time For Goal Achievement: 02/06/15 Potential to Achieve Goals: Fair    Frequency Min 3X/week   Barriers to discharge Decreased caregiver support Patient in the hospital due to inability of caregivers to assist anymore.    Co-evaluation               End of Session   Activity Tolerance: Patient tolerated treatment well;No increased pain Patient left: in bed;with call bell/phone within reach;with SCD's reapplied Nurse Communication: Mobility status         Time: 4356-8616 PT Time Calculation (min) (ACUTE ONLY): 36 min   Charges:   PT Evaluation $Initial PT Evaluation Tier I: 1 Procedure PT  Treatments $Therapeutic Exercise: 8-22 mins   PT G CodesRoanna Epley, SPT [phone] 01/23/2015, 1:35 PM  I have read, reviewed and agree with student's note.   Dawn [phone] (pager)

## 2015-01-24 DIAGNOSIS — J811 Chronic pulmonary edema: Secondary | ICD-10-CM | POA: Diagnosis present

## 2015-01-24 LAB — GLUCOSE, CAPILLARY
GLUCOSE-CAPILLARY: 175 mg/dL — AB (ref 65–99)
Glucose-Capillary: 170 mg/dL — ABNORMAL HIGH (ref 65–99)
Glucose-Capillary: 164 mg/dL — ABNORMAL HIGH (ref 65–99)
Glucose-Capillary: 109 mg/dL — ABNORMAL HIGH (ref 65–99)

## 2015-01-24 LAB — BASIC METABOLIC PANEL
Anion gap: 6 (ref 5–15)
BUN: 23 mg/dL — ABNORMAL HIGH (ref 6–20)
CHLORIDE: 93 mmol/L — AB (ref 101–111)
CO2: 36 mmol/L — AB (ref 22–32)
CREATININE: 0.99 mg/dL (ref 0.44–1.00)
Calcium: 8.7 mg/dL — ABNORMAL LOW (ref 8.9–10.3)
GFR calc non Af Amer: 50 mL/min — ABNORMAL LOW (ref >60)
GFR, EST AFRICAN AMERICAN: 58 mL/min — AB (ref >60)
GLUCOSE: 220 mg/dL — AB (ref 65–99)
Potassium: 3.7 mmol/L (ref 3.5–5.1)
Sodium: 135 mmol/L (ref 135–145)

## 2015-01-24 LAB — MAGNESIUM: Magnesium: 1.8 mg/dL (ref 1.7–2.4)

## 2015-01-24 MED ORDER — INSULIN GLARGINE 100 UNIT/ML ~~LOC~~ SOLN
25.0000 [IU] | Freq: Every day | SUBCUTANEOUS | Status: DC
Start: 2015-01-24 — End: 2015-01-25
  Administered 2015-01-24: 25 [IU] via SUBCUTANEOUS
  Filled 2015-01-24 (×2): qty 0.25

## 2015-01-24 MED ORDER — INSULIN ASPART 100 UNIT/ML ~~LOC~~ SOLN
8.0000 [IU] | Freq: Three times a day (TID) | SUBCUTANEOUS | Status: DC
  Administered 2015-01-24 – 2015-01-25 (×2): 8 [IU] via SUBCUTANEOUS

## 2015-01-24 NOTE — Progress Notes (Signed)
[place] TEAM 1 - Stepdown/ICU TEAM Progress Note  Elizabeth Pugh MVV:[id number] DOB: October 17, 1926 DOA: 01/19/2015 PCP: Elizabeth Fendt, MD  Admit HPI / Brief Narrative: [name] PMHx MORBID obesity, cva, chronic afib on apixaban, bedbound for three years due to chronic knee pain,   Presented with sob. Patient had been c/o sob for a few days, and had noticed worsening of lower extremity edema. Daughter brought patient to the ED. In the ER, patient was found to be hypoxic to 85% on room air, Venous blood gas noted CO2 retention, cxr suggested pulmonary edema. She was started on bipap and was given breathing treatments and lasix 20mg  x1.   HPI/Subjective: 9/22 A/O 4, NAD. Patient states only reason has not been getting out of bed is because no one will help her.   Assessment/Plan: Acute respiratory failure with hypoxia and hypercapnia -Appears to be driven by pulmonary edema - much improved at present  - does not require oxygen at home. (Patient inactive lays in bed all day) ; start patient on RA will monitor for desaturation.   Acute on chronic diastolic CHF -No recent baseline wgt available - admit weight 90.7kg - wgt today clearly(124kg) accuracy? - Strict in and out since admission; -13.3 L -Continue Lasix 20 mg BID -Out of bed to chair daily -PT/OT; recommendation is SNF  Obesity hypoventilation syndrome/OSA  -ABG in a.m. will verify if patient retaining CO2 -This is reportedly patient's history but it's not clear to me that there has been a formal diagnosis  -Does not appear patient has been using BiPAP since 9/18. ABG obtained during this admission shows compensated CO2 retention.  Chronic afib -On chronic apixaban  - rate well controlled  DM Type2 uncontrolled -9/18 hemoglobin A1c= 8.9 -Increase Lantus to 25 units daily -NovoLog 8 units QAC; patient was carb counting at home and adjusting meal NovoLog however as hemoglobin A1c shows this was not working very  well.  Seizure  Appears to be quiescent  Equivocal UA -the patient is afebrile with a normal white blood cell count therefore I do not feel that she has a UTI  MASD, specifically intertriginous dermatitis in the sub-pannicular skin folds   Code Status: FULL Family Communication: no family present at time of exam Disposition Plan: SNF    Consultants:   Procedure/Significant Events: 9/18 echocardiogram;- Left ventricle: mild concentric hypertrophy.  -LVEF=55% - 60%.  -. Doppler parameters are consistent with highventricular filling pressure. - Mitral valve: There was trivial regurgitation.   Culture   Antibiotics:   DVT prophylaxis: apixaban   Devices    LINES / TUBES:      Continuous Infusions:   Objective: VITAL SIGNS: Temp: 97.9 F (36.6 C) (09/22 0751) Temp Source: Oral (09/22 0751) BP: 127/77 mmHg (09/22 0751) Pulse Rate: 69 (09/22 0751) SPO2; FIO2:   Intake/Output Summary (Last 24 hours) at 01/24/15 1050 Last data filed at 01/24/15 0600  Gross per 24 hour  Intake    400 ml  Output   1200 ml  Net   -800 ml     Exam: General: A/O 4, NAD, No acute respiratory distress Eyes: Negative headache,negative scleral hemorrhage ENT: Negative Runny nose, negative ear pain, negative gingival bleeding Neck:  Negative scars, masses, torticollis, lymphadenopathy, JVD Lungs: Clear to auscultation bilaterally without wheezes or crackles Cardiovascular: Regular rate and rhythm without murmur gallop or rub normal S1 and S2 Abdomen:negative abdominal pain, nondistended, positive soft, bowel sounds, no rebound, no ascites, no appreciable mass Extremities: No significant cyanosis,  clubbing, or edema bilateral lower extremities; but significantly decreased Psychiatric:  Negative depression, negative anxiety, negative fatigue, negative mania  Neurologic:  Cranial nerves II through XII intact, tongue/uvula midline, all extremities muscle strength 5/5, sensation  intact throughout, unable to further assess secondary to patient being bedbound  Data Reviewed: Basic Metabolic Panel:  Recent Labs Lab 01/20/15 0820 01/21/15 0605 01/22/15 0048 01/23/15 0326 01/24/15 0311  NA 141 140 140 137 135  K 4.2 4.1 3.8 4.0 3.7  CL 101 98* 95* 93* 93*  CO2 35* 36* 38* 38* 36*  GLUCOSE 187* 226* 155* 230* 220*  BUN 17 16 17 20  23*  CREATININE 0.89 0.87 0.93 1.11* 0.99  CALCIUM 9.0 8.6* 8.7* 8.6* 8.7*  MG 1.8  --   --  1.8 1.8   Liver Function Tests:  Recent Labs Lab 01/19/15 2258 01/20/15 0820 01/21/15 0605  AST 18 17 14*  ALT 21 19 15   ALKPHOS 56 50 48  BILITOT 1.0 1.0 1.1  PROT 6.6 6.3* 5.2*  ALBUMIN 3.5 3.3* 3.0*   No results for input(s): LIPASE, AMYLASE in the last 168 hours. No results for input(s): AMMONIA in the last 168 hours. CBC:  Recent Labs Lab 01/19/15 2258 01/20/15 0820 01/21/15 0006  WBC 3.3* 4.1 2.9*  NEUTROABS 2.3  --   --   HGB 12.9 12.2 12.3  HCT 42.1 41.6 40.0  MCV 97.9 98.6 96.4  PLT 123* 120* 122*   Cardiac Enzymes: No results for input(s): CKTOTAL, CKMB, CKMBINDEX, TROPONINI in the last 168 hours. BNP (last 3 results)  Recent Labs  10/06/14 0340 01/19/15 2259  BNP 129.6* 180.3*    ProBNP (last 3 results) No results for input(s): PROBNP in the last 8760 hours.  CBG:  Recent Labs Lab 01/23/15 0830 01/23/15 1224 01/23/15 1734 01/23/15 2236 01/24/15 0750  GLUCAP 224* 178* 144* 226* 170*    Recent Results (from the past 240 hour(s))  MRSA PCR Screening     Status: None   Collection Time: 01/20/15  1:40 AM  Result Value Ref Range Status   MRSA by PCR NEGATIVE NEGATIVE Final    Comment:        The GeneXpert MRSA Assay (FDA approved for NASAL specimens only), is one component of a comprehensive MRSA colonization surveillance program. It is not intended to diagnose MRSA infection nor to guide or monitor treatment for MRSA infections.      Studies:  Recent x-ray studies have been  reviewed in detail by the Attending Physician  Scheduled Meds:  Scheduled Meds: . apixaban  2.5 mg Oral BID  . carvedilol  20 mg Oral Daily  . doxepin  25 mg Oral QHS  . furosemide  20 mg Intravenous BID  . insulin aspart  0-20 Units Subcutaneous TID WC  . insulin aspart  8 Units Subcutaneous TID WC  . insulin glargine  25 Units Subcutaneous QHS  . ipratropium-albuterol  3 mL Nebulization QID  . levETIRAcetam  500 mg Oral BID  . rOPINIRole  2 mg Oral QID    Time spent on care of this patient: 40 mins   WOODS, Geraldo Docker , MD  [facility] Office  [phone] Pager - [phone]  On-Call/Text Page:      [email or web address]      password [email or web address]  If 7PM-7AM, please contact night-coverage [email or web address] Password [email or web address] 01/24/2015, 10:50 AM   LOS: 4 days   Care during the described time interval was provided by me .  I have reviewed this  patient's available data, including medical history, events of note, physical examination, and all test results as part of my evaluation. I have personally reviewed and interpreted all radiology studies.   Dia Crawford, MD [phone] Pager

## 2015-01-24 NOTE — Clinical Social Work Note (Signed)
Clinical Social Work Assessment  Patient Details  Name: Elizabeth Pugh MRN: [id number] Date of Birth: 01-31-1927  Date of referral:  01/24/15               Reason for consult:  Facility Placement                Permission sought to share information with:  Facility Sport and exercise psychologist, Family Supports Permission granted to share information::  Yes, Verbal Permission Granted  Name::     [employer]  Agency::  [facility]  Relationship::  daughter  Contact Information:     Housing/Transportation Living arrangements for the past 2 months:  Single Family Home Source of Information:  Adult Children, Patient Patient Interpreter Needed:  None Criminal Activity/Legal Involvement Pertinent to Current Situation/Hospitalization:  No - Comment as needed Significant Relationships:  Adult Children, Siblings Lives with:  Adult Children Do you feel safe going back to the place where you live?  Yes Need for family participation in patient care:  Yes (Comment) (pt daughter is primary caregiver at home)  Care giving concerns:  Pt lives at home with daughter who is primary caregiver- pt is almost entirely dependent and has been basically bed bound for 3 years-  Pt reports that she has gotten continuously weaker over those 3 years and now can barely get out of bed at all.  Dtr has been taking care of pt but pt needs increasing and dtr unable to provide sufficient physical assist   Facilities manager / plan:  CSW spoke with pt concerning PT recommendation for SNF to increase mobility/independence at home  Employment status:  Retired Forensic scientist:  Medicare PT Recommendations:  [place] / Referral to community resources:  [place]  Patient/Family's Response to care:  Pt is very agreeable to idea of short term rehab to increase mobility.  Pt is very motivated to become more independent at home.  Pt daughter is also agreeable to SNF  placement for short term rehab and would like for pt to have more mobility.  Patient/Family's Understanding of and Emotional Response to Diagnosis, Current Treatment, and Prognosis:  Pt is hopeful that PT/OT will increase mobility and quality of life.  Pt has no questions or concerns regarding care/prognosis at this time.  Emotional Assessment Appearance:  Appears stated age Attitude/Demeanor/Rapport:    Affect (typically observed):  Appropriate, Pleasant Orientation:  Oriented to Self, Oriented to Place, Oriented to  Time, Oriented to Situation Alcohol / Substance use:  Not Applicable Psych involvement (Current and /or in the community):  No (Comment)  Discharge Needs  Concerns to be addressed:  Care Coordination Readmission within the last 30 days:  No Current discharge risk:  Physical Impairment Barriers to Discharge:  Continued Medical Work up   [employer], LCSW 01/24/2015, 10:14 AM

## 2015-01-24 NOTE — Evaluation (Signed)
Occupational Therapy Evaluation Patient Details Name: Elizabeth Pugh MRN: [id number] DOB: [birth year] Today's Date: 01/24/2015    History of Present Illness 81 y.o. female with h/o obesity, CVA, chronic AFib on apixaban, has been bedbound for the last three years due to chronic knee pain, presented with sob. Patient is currently on BIPAP, history obtained from talking to [place] and patient's daughter. Per daughter patient has been c/o sob for the last few days, she has also noticed worsening of lower extremity edema. Daughter brought patient to the ED due to worsening of symptom and patient seems more drowsy than usual.   Clinical Impression   Pt admitted with above. She demonstrates the below listed deficits and will benefit from continued OT to maximize safety and independence with BADLs.  Pt is very deconditioned.  She requires mod A for UB ADLs and total A for LB ADLs. She would benefit from SNF level rehab at discharge.       Follow Up Recommendations  SNF    Equipment Recommendations  None recommended by OT    Recommendations for Other Services       Precautions / Restrictions Precautions Precautions: Other (comment) (Bedbound past 3 years per daughter.)      Mobility Bed Mobility               General bed mobility comments: Pt in chair   Transfers                 General transfer comment: Unable to attempt.  Pt unable to achive adequate knee flexion.       Balance Overall balance assessment: Needs assistance Sitting-balance support: Feet unsupported Sitting balance-Leahy Scale: Poor Sitting balance - Comments: Pt requires min - mod A to sit EOC for up to 10 seconds                                     ADL Overall ADL's : Needs assistance/impaired Eating/Feeding: Set up;Sitting   Grooming: Wash/dry hands;Wash/dry face;Oral care;Brushing hair;Minimal assistance;Sitting   Upper Body Bathing: Moderate assistance;Sitting   Lower Body  Bathing: Total assistance;Bed level   Upper Body Dressing : Moderate assistance;Sitting   Lower Body Dressing: Total assistance;Bed level   Toilet Transfer: Total assistance Toilet Transfer Details (indicate cue type and reason): unable  Toileting- Clothing Manipulation and Hygiene: Total assistance;Bed level         General ADL Comments: Pt moved to [place] sitting x 7 for 10 second holds     Vision     Perception     Praxis      Pertinent Vitals/Pain Pain Assessment: No/denies pain     Hand Dominance Right   Extremity/Trunk Assessment Upper Extremity Assessment Upper Extremity Assessment: Generalized weakness;RUE deficits/detail;LUE deficits/detail RUE Deficits / Details: atrophy of thenar eminence noted  LUE Deficits / Details: shoudler elevation limted to ~90* from previous CVA   Lower Extremity Assessment Lower Extremity Assessment: Defer to PT evaluation       Communication Communication Communication: No difficulties   Cognition Arousal/Alertness: Awake/alert Behavior During Therapy: WFL for tasks assessed/performed Overall Cognitive Status: Within Functional Limits for tasks assessed                     General Comments       Exercises       Shoulder Instructions      Home Living Family/patient expects to be  discharged to:: Skilled nursing facility                                        Prior Functioning/Environment Level of Independence: Needs assistance  Gait / Transfers Assistance Needed: Total assist ADL's / Homemaking Assistance Needed: Total assist for bathing, dressing, toileting.        OT Diagnosis: Generalized weakness   OT Problem List: Decreased strength;Decreased activity tolerance;Impaired balance (sitting and/or standing);Decreased knowledge of use of DME or AE;Decreased knowledge of precautions;Obesity   OT Treatment/Interventions: Self-care/ADL training;Therapeutic exercise;Therapeutic  activities;Patient/family education;Balance training    OT Goals(Current goals can be found in the care plan section) Acute Rehab OT Goals Patient Stated Goal: to get stronger OT Goal Formulation: With patient Time For Goal Achievement: 02/07/15 Potential to Achieve Goals: Good ADL Goals Pt Will Perform Upper Body Bathing: with min assist;sitting Pt Will Perform Upper Body Dressing: with mod assist;sitting Additional ADL Goal #1: Pt will maintain EOC or EOB sitting with UE support x 3 mins and min guard assist in prep for ADLs.  Additional ADL Goal #2: Pt will be independent with theraband HEP for bil. UEs   OT Frequency: Min 2X/week   Barriers to D/C: Decreased caregiver support          Co-evaluation              End of Session Nurse Communication: Mobility status  Activity Tolerance: Patient tolerated treatment well Patient left: in chair;with call bell/phone within reach   Time: 2951-8841 OT Time Calculation (min): 21 min Charges:  OT General Charges $OT Visit: 1 Procedure OT Evaluation $Initial OT Evaluation Tier I: 1 Procedure G-Codes:    [place], Ellard Artis M 02-13-15, 4:08 PM

## 2015-01-25 LAB — BASIC METABOLIC PANEL
ANION GAP: 6 (ref 5–15)
BUN: 24 mg/dL — ABNORMAL HIGH (ref 6–20)
CALCIUM: 8.9 mg/dL (ref 8.9–10.3)
CHLORIDE: 95 mmol/L — AB (ref 101–111)
CO2: 35 mmol/L — AB (ref 22–32)
Creatinine, Ser: 1.03 mg/dL — ABNORMAL HIGH (ref 0.44–1.00)
GFR calc non Af Amer: 48 mL/min — ABNORMAL LOW (ref >60)
GFR, EST AFRICAN AMERICAN: 55 mL/min — AB (ref >60)
GLUCOSE: 168 mg/dL — AB (ref 65–99)
POTASSIUM: 4.1 mmol/L (ref 3.5–5.1)
Sodium: 136 mmol/L (ref 135–145)

## 2015-01-25 LAB — GLUCOSE, CAPILLARY
GLUCOSE-CAPILLARY: 129 mg/dL — AB (ref 65–99)
GLUCOSE-CAPILLARY: 103 mg/dL — AB (ref 65–99)

## 2015-01-25 LAB — MAGNESIUM: Magnesium: 1.8 mg/dL (ref 1.7–2.4)

## 2015-01-25 MED ORDER — INSULIN PEN NEEDLE 31G X 8 MM MISC: 25.0000 [IU] | Freq: Every day | Status: DC

## 2015-01-25 MED ORDER — IPRATROPIUM-ALBUTEROL 0.5-2.5 (3) MG/3ML IN SOLN
3.0000 mL | Freq: Four times a day (QID) | RESPIRATORY_TRACT | Status: DC
  Administered 2015-01-25: 3 mL via RESPIRATORY_TRACT
  Filled 2015-01-25: qty 3

## 2015-01-25 MED ORDER — INSULIN ASPART 100 UNIT/ML ~~LOC~~ SOLN: 13.0000 [IU] | Freq: Three times a day (TID) | SUBCUTANEOUS | Status: DC

## 2015-01-25 MED ORDER — CARVEDILOL PHOSPHATE ER 20 MG PO CP24: 20.0000 mg | ORAL_CAPSULE | Freq: Every day | ORAL | Status: DC

## 2015-01-25 MED ORDER — IPRATROPIUM-ALBUTEROL 0.5-2.5 (3) MG/3ML IN SOLN: 3.0000 mL | Freq: Four times a day (QID) | RESPIRATORY_TRACT | Status: DC | PRN

## 2015-01-25 MED ORDER — FUROSEMIDE 20 MG PO TABS: 20.0000 mg | ORAL_TABLET | Freq: Two times a day (BID) | ORAL | Status: DC

## 2015-01-25 MED ORDER — INSULIN GLARGINE 100 UNIT/ML SOLOSTAR PEN: 25.0000 [IU] | PEN_INJECTOR | Freq: Every day | SUBCUTANEOUS | Status: DC

## 2015-01-25 NOTE — Care Management Important Message (Signed)
Important Message  Patient Details  Name: Elizabeth Pugh MRN: [id number] Date of Birth: [birth year]   Medicare Important Message Given:  Yes-third notification given    Loann Quill 01/25/2015, 10:26 AM

## 2015-01-25 NOTE — Progress Notes (Signed)
Facility request transport be set up for 4:30pm- family will go to facility to complete paperwork at 3:45pm  Patient will discharge to [name] Anticipated discharge date:01/25/15 Family notified: pt daughter Transportation by [place] signing off.  Domenica Reamer, [place] Social Worker [phone]

## 2015-01-25 NOTE — Progress Notes (Signed)
Pt dtr has chosen [name]- facility had made bed offer and can accept pt today.  CSW will continue to follow.  Domenica Reamer, [place] Social Worker [phone]

## 2015-01-25 NOTE — Progress Notes (Signed)
Pt discharged to [facility] via [place]. Report called to receiving RN. Dorena Cookey, RN

## 2015-01-25 NOTE — Discharge Instructions (Signed)

## 2015-01-25 NOTE — Progress Notes (Signed)
[place] TEAM 1 - Stepdown/ICU TEAM Progress Note  KATALAYA BEEL GDJ:[id number] DOB: [birth year] DOA: 01/19/2015 PCP: Philis Fendt, MD  Admit HPI / Brief Narrative: [name] PMHx MORBID obesity, cva, chronic afib on apixaban, bedbound for three years due to chronic knee pain,   Presented with sob. Patient had been c/o sob for a few days, and had noticed worsening of lower extremity edema. Daughter brought patient to the ED. In the ER, patient was found to be hypoxic to 85% on room air, Venous blood gas noted CO2 retention, cxr suggested pulmonary edema. She was started on bipap and was given breathing treatments and lasix 20mg  x1.   HPI/Subjective: 9/23 A/O 4, NAD. Patient states only reason has not been getting out of bed is because no one will help her.   Assessment/Plan: Acute respiratory failure with hypoxia and hypercapnia -Resolved - Currently on RA without dropping saturation.   Acute on chronic diastolic CHF -No recent baseline wgt available - admit weight 90.7kg - wgt today clearly(123.5kg) accuracy? - Strict in and out since admission; -16.3 L -Continue Lasix 20 mg BID -Out of bed to chair daily -PT/OT; recommendation is SNF  Obesity hypoventilation syndrome/OSA  -ABG in a.m. will verify if patient retaining CO2 -No formal diagnosis   -Has not required BiPAP since 9/18. ABG obtained during this admission shows compensated CO2 retention.  Chronic afib -On chronic apixaban  - rate well controlled  DM Type2 uncontrolled -9/18 hemoglobin A1c= 8.9 -Increase Lantus to 25 units daily -Increase NovoLog 13 units QAC; patient was carb counting at home and adjusting meal NovoLog however as hemoglobin A1c shows this was not working very well.  Seizure  Appears to be quiescent  Equivocal UA -the patient is afebrile with a normal white blood cell count therefore I do not feel that she has a UTI  MASD, specifically intertriginous dermatitis in the sub-pannicular  skin folds   Code Status: FULL Family Communication: no family present at time of exam Disposition Plan: SNF    Consultants:   Procedure/Significant Events: 9/18 echocardiogram;- Left ventricle: mild concentric hypertrophy.  -LVEF=55% - 60%.  -. Doppler parameters are consistent with highventricular filling pressure. - Mitral valve: There was trivial regurgitation.   Culture   Antibiotics:   DVT prophylaxis: apixaban   Devices    LINES / TUBES:      Continuous Infusions:   Objective: VITAL SIGNS: Temp: 98.2 F (36.8 C) (09/23 0400) Temp Source: Oral (09/23 0400) BP: 115/69 mmHg (09/23 0800) Pulse Rate: 89 (09/23 0800) SPO2; FIO2:   Intake/Output Summary (Last 24 hours) at 01/25/15 6222 Last data filed at 01/25/15 0700  Gross per 24 hour  Intake      0 ml  Output   3000 ml  Net  -3000 ml     Exam: General: A/O 4, NAD, No acute respiratory distress Eyes: Negative headache,negative scleral hemorrhage ENT: Negative Runny nose, negative ear pain, negative gingival bleeding Neck:  Negative scars, masses, torticollis, lymphadenopathy, JVD Lungs: Clear to auscultation bilaterally without wheezes or crackles Cardiovascular: Regular rate and rhythm without murmur gallop or rub normal S1 and S2 Abdomen:negative abdominal pain, nondistended, positive soft, bowel sounds, no rebound, no ascites, no appreciable mass Extremities: No significant cyanosis, clubbing. Positive bilateral lower extremity edema now decreased to 1-2+ (down from 2-3+). Psychiatric:  Negative depression, negative anxiety, negative fatigue, negative mania  Neurologic:  Cranial nerves II through XII intact, tongue/uvula midline, all extremities muscle strength 5/5, sensation intact throughout, unable to  further assess secondary to patient being bedbound  Data Reviewed: Basic Metabolic Panel:  Recent Labs Lab 01/20/15 0820 01/21/15 0605 01/22/15 0048 01/23/15 0326 01/24/15 0311  01/25/15 0251  NA 141 140 140 137 135 136  K 4.2 4.1 3.8 4.0 3.7 4.1  CL 101 98* 95* 93* 93* 95*  CO2 35* 36* 38* 38* 36* 35*  GLUCOSE 187* 226* 155* 230* 220* 168*  BUN 17 16 17 20  23* 24*  CREATININE 0.89 0.87 0.93 1.11* 0.99 1.03*  CALCIUM 9.0 8.6* 8.7* 8.6* 8.7* 8.9  MG 1.8  --   --  1.8 1.8 1.8   Liver Function Tests:  Recent Labs Lab 01/19/15 2258 01/20/15 0820 01/21/15 0605  AST 18 17 14*  ALT 21 19 15   ALKPHOS 56 50 48  BILITOT 1.0 1.0 1.1  PROT 6.6 6.3* 5.2*  ALBUMIN 3.5 3.3* 3.0*   No results for input(s): LIPASE, AMYLASE in the last 168 hours. No results for input(s): AMMONIA in the last 168 hours. CBC:  Recent Labs Lab 01/19/15 2258 01/20/15 0820 01/21/15 0006  WBC 3.3* 4.1 2.9*  NEUTROABS 2.3  --   --   HGB 12.9 12.2 12.3  HCT 42.1 41.6 40.0  MCV 97.9 98.6 96.4  PLT 123* 120* 122*   Cardiac Enzymes: No results for input(s): CKTOTAL, CKMB, CKMBINDEX, TROPONINI in the last 168 hours. BNP (last 3 results)  Recent Labs  10/06/14 0340 01/19/15 2259  BNP 129.6* 180.3*    ProBNP (last 3 results) No results for input(s): PROBNP in the last 8760 hours.  CBG:  Recent Labs Lab 01/23/15 2236 01/24/15 0750 01/24/15 1137 01/24/15 1650 01/24/15 2200  GLUCAP 226* 170* 164* 109* 175*    Recent Results (from the past 240 hour(s))  MRSA PCR Screening     Status: None   Collection Time: 01/20/15  1:40 AM  Result Value Ref Range Status   MRSA by PCR NEGATIVE NEGATIVE Final    Comment:        The GeneXpert MRSA Assay (FDA approved for NASAL specimens only), is one component of a comprehensive MRSA colonization surveillance program. It is not intended to diagnose MRSA infection nor to guide or monitor treatment for MRSA infections.      Studies:  Recent x-ray studies have been reviewed in detail by the Attending Physician  Scheduled Meds:  Scheduled Meds: . apixaban  2.5 mg Oral BID  . carvedilol  20 mg Oral Daily  . doxepin  25 mg  Oral QHS  . furosemide  20 mg Intravenous BID  . insulin aspart  0-20 Units Subcutaneous TID WC  . insulin aspart  8 Units Subcutaneous TID WC  . insulin glargine  25 Units Subcutaneous QHS  . ipratropium-albuterol  3 mL Nebulization QID  . levETIRAcetam  500 mg Oral BID  . rOPINIRole  2 mg Oral QID    Time spent on care of this patient: 40 mins   WOODS, Geraldo Docker , MD  [facility] Office  [phone] Pager - [phone]  On-Call/Text Page:      [email or web address]      password [email or web address]  If 7PM-7AM, please contact night-coverage [email or web address] Password [facility] 01/25/2015, 8:52 AM   LOS: 5 days   Care during the described time interval was provided by me .  I have reviewed this patient's available data, including medical history, events of note, physical examination, and all test results as part of my evaluation. I have personally reviewed and interpreted all radiology studies.  Curtis Woods, MD [phone] Pager    

## 2015-01-25 NOTE — Progress Notes (Signed)
Physical Therapy Treatment Patient Details Name: Elizabeth Pugh MRN: [id number] DOB: March 10, 1927 Today's Date: 02-21-15    History of Present Illness 81 y.o. female with h/o obesity, CVA, chronic AFib on apixaban, has been bedbound for the last three years due to chronic knee pain, presented with sob and increased edema    PT Comments    Pt with very limited knee ROM and do not feel transfers OOB are feasible without very elevated surface but preferably use of lift. Pt with limited activity tolerance and denied attempt at EOB today. Pt encouraged to  Continue all extremity HEP throughout the day. Will follow.   Follow Up Recommendations  SNF;Supervision/Assistance - 24 hour     Equipment Recommendations       Recommendations for Other Services       Precautions / Restrictions Precautions Precautions: Fall    Mobility  Bed Mobility Overal bed mobility: Needs Assistance Bed Mobility: Rolling Rolling: Mod assist;+2 for physical assistance         General bed mobility comments: x 3 attempts of rolling for pericare, linen change and +2 total to slide toward [facility]  Transfers                    Ambulation/Gait                 Stairs            Wheelchair Mobility    Modified Rankin (Stroke Patients Only)       Balance                                    Cognition Arousal/Alertness: Awake/alert Behavior During Therapy: WFL for tasks assessed/performed Overall Cognitive Status: Impaired/Different from baseline Area of Impairment: Safety/judgement         Safety/Judgement: Decreased awareness of deficits          Exercises General Exercises - Upper Extremity Shoulder Flexion: AROM;Both;Seated;15 reps Elbow Extension: AROM;Both;15 reps;Seated General Exercises - Lower Extremity Short Arc Quad: AROM;Both;15 reps;Supine Heel Slides: AAROM;Right;10 reps (pt with very limited ROM right knee 20degrees max and moved within  this range. LLE with very minimal motion)    General Comments        Pertinent Vitals/Pain Pain Assessment: No/denies pain    Home Living                      Prior Function            PT Goals (current goals can now be found in the care plan section) Progress towards PT goals: Not progressing toward goals - comment (pt with limited bil knee ROM and limited activity tolerance)    Frequency  Min 2X/week    PT Plan Frequency needs to be updated;Current plan remains appropriate    Co-evaluation             End of Session   Activity Tolerance: Patient tolerated treatment well;No increased pain Patient left: in bed;with call bell/phone within reach     Time: 1034-1053 PT Time Calculation (min) (ACUTE ONLY): 19 min  Charges:  $Therapeutic Activity: 8-22 mins                    G Codes:      Melford Aase Feb 21, 2015, 12:36 PM Elwyn Reach, [place]

## 2015-01-25 NOTE — Discharge Summary (Signed)
Physician Discharge Summary  Elizabeth Pugh YNW:[id number] DOB: 1926-11-02 DOA: 01/19/2015  PCP: Philis Fendt, MD  Admit date: 01/19/2015 Discharge date: 01/25/2015  Time spent: 40 minutes  Recommendations for Outpatient Follow-up: Acute respiratory failure with hypoxia and hypercapnia -Resolved - Currently on RA without dropping saturation.   Acute on chronic diastolic CHF -No recent baseline wgt available - admit weight 90.7kg - wgt today clearly(123.5kg) accuracy? - Strict in and out since admission; -16.3 L -Continue Lasix 20 mg BID -Dr. Nolene Ebbs (PCP) to determine patient's dry weight. Educate patient on maintaining daily weight Journal.  Obesity hypoventilation syndrome/OSA  -ABG in a.m. will verify if patient retaining CO2 -No formal diagnosis  -Has not required BiPAP since 9/18. ABG obtained during this admission shows compensated CO2 retention. -Schedule appointment 7-14 days post discharge to Establish care with pulmonology for OSA requiring sleep study, and OHS.  Chronic afib -On chronic apixaban  - rate well controlled  DM Type2 uncontrolled -9/18 hemoglobin A1c= 8.9 -Increase Lantus to 25 units daily -Increase NovoLog 13 units QAC; patient was carb counting at home and adjusting meal NovoLog however as hemoglobin A1c shows this was not working very well. -Follow-up with Dr. Nolene Ebbs for acute on chronic diastolic CHF, diabetes type 2 uncontrolled, OSA and chronic atrial fibrillation  Seizure  Appears to be quiescent      Discharge Diagnoses:  Active Problems:   Respiratory failure   Hypercapnemia   Acute respiratory failure with hypoxia and hypercapnia   Diastolic CHF, acute on chronic   Obesity hypoventilation syndrome   Chronic atrial fibrillation   Diabetes type 2, uncontrolled   Seizures   Pulmonary edema   Discharge Condition: Stable  Diet recommendation: Heart healthy/American diabetic Association  Filed Weights   01/23/15  0450 01/24/15 0355 01/25/15 0500  Weight: 123.9 kg (273 lb 2.4 oz) 124 kg (273 lb 5.9 oz) 123.5 kg (272 lb 4.3 oz)    History of present illness:  [name] PMHx MORBID obesity, cva, chronic afib on apixaban, bedbound for three years due to chronic knee pain,   Presented with sob. Patient had been c/o sob for a few days, and had noticed worsening of lower extremity edema. Daughter brought patient to the ED. In the ER, patient was found to be hypoxic to 85% on room air, Venous blood gas noted CO2 retention, cxr suggested pulmonary edema. She was started on bipap and was given breathing treatments and lasix 20mg  x1. During his hospitalization patient was treated for acute respiratory failure with hypoxia and hypercapnia, secondary to acute on chronic diastolic CHF (MVHQIONG-29.5 L). In addition patient was treated for her Diabetes Type 2 Uncontrolled.    Procedure/Significant Events: 9/18 echocardiogram;- Left ventricle: mild concentric hypertrophy.  -LVEF=55% - 60%.  -. Doppler parameters are consistent with highventricular filling pressure. - Mitral valve: There was trivial regurgitation.  DVT prophylaxis: apixaban    Discharge Exam: Filed Vitals:   01/25/15 0400 01/25/15 0500 01/25/15 0800 01/25/15 0917  BP:   115/69 125/82  Pulse:   89 85  Temp: 98.2 F (36.8 C)  98.2 F (36.8 C)   TempSrc: Oral  Oral   Resp:   19 19  Height:      Weight:  123.5 kg (272 lb 4.3 oz)    SpO2:   95% 93%    General: A/O 4, NAD, No acute respiratory distress Eyes: Negative headache,negative scleral hemorrhage ENT: Negative Runny nose, negative ear pain, negative gingival bleeding Neck: Negative scars, masses, torticollis,  lymphadenopathy, JVD Lungs: Clear to auscultation bilaterally without wheezes or crackles Cardiovascular: Regular rate and rhythm without murmur gallop or rub normal S1 and S2 Abdomen:negative abdominal pain, nondistended, positive soft, bowel sounds, no rebound, no  ascites, no appreciable mass Extremities: No significant cyanosis, clubbing. Positive bilateral lower extremity edema now decreased to 1-2+ (down from 2-3+).    Discharge Instructions     Medication List    ASK your doctor about these medications        albuterol 108 (90 BASE) MCG/ACT inhaler  Commonly known as:  PROVENTIL HFA;VENTOLIN HFA  Inhale 1 puff into the lungs every 6 (six) hours as needed for wheezing or shortness of breath.     amLODipine 5 MG tablet  Commonly known as:  NORVASC  Take 5 mg by mouth daily.     apixaban 2.5 MG Tabs tablet  Commonly known as:  ELIQUIS  Take 2.5 mg by mouth 2 (two) times daily.     CALCIUM 500+D 500-400 MG-UNIT per tablet  Generic drug:  calcium-vitamin D  Take 1 tablet by mouth 2 (two) times daily.     carvedilol 20 MG 24 hr capsule  Commonly known as:  COREG CR  Take 40 mg by mouth daily.     doxepin 25 MG capsule  Commonly known as:  SINEQUAN  Take 25 mg by mouth at bedtime.     esomeprazole 40 MG capsule  Commonly known as:  NEXIUM  Take 40 mg by mouth daily as needed (heart burn).     ferrous sulfate 325 (65 FE) MG tablet  Take 325 mg by mouth daily with breakfast.     glimepiride 2 MG tablet  Commonly known as:  AMARYL  Take 2 mg by mouth daily before breakfast.     hydrOXYzine 25 MG tablet  Commonly known as:  ATARAX/VISTARIL  Take 25 mg by mouth 2 (two) times daily as needed for itching.     insulin aspart 100 UNIT/ML injection  Commonly known as:  novoLOG  Inject 1-20 Units into the skin 3 (three) times daily before meals. 1 unit for every 20 over glucose of 150     levETIRAcetam 500 MG tablet  Commonly known as:  KEPPRA  Take 1 tablet (500 mg total) by mouth 2 (two) times daily.     pregabalin 75 MG capsule  Commonly known as:  LYRICA  Take 75 mg by mouth 2 (two) times daily. For restless legs and nerve pain     rOPINIRole 2 MG tablet  Commonly known as:  REQUIP  Take 2 mg by mouth 4 (four) times  daily.     telmisartan 40 MG tablet  Commonly known as:  MICARDIS  Take 40 mg by mouth 2 (two) times daily.     VOLTAREN 1 % Gel  Generic drug:  diclofenac sodium  Apply 1 application topically 4 (four) times daily as needed (knee pain). For pain       Allergies  Allergen Reactions  . Erythromycin Other (See Comments)    Vaginal itching  . Penicillins Rash  . Zithromax [Azithromycin] Other (See Comments)    gave her a yeast infection.      The results of significant diagnostics from this hospitalization (including imaging, microbiology, ancillary and laboratory) are listed below for reference.    Significant Diagnostic Studies: Dg Chest 2 View  01/20/2015   CLINICAL DATA:  Shortness of breath.  Crackles and pitting edema.  EXAM: CHEST  2 VIEW  COMPARISON:  Radiographs and CT 10/05/2014  FINDINGS: Loculated right pleural effusion, unchanged from prior exam. Increased cardiomegaly. Lower lung volumes from prior. Peribronchial cuffing, suspect pulmonary edema. No pneumothorax. Diffuse bony under mineralization. Chronic widening of the left acromioclavicular joint.  IMPRESSION: 1. Increased cardiomegaly with peribronchial cuffing, suspicious for pulmonary edema. Findings consistent with CHF. 2. Partially loculated right pleural effusion, grossly unchanged.   Electronically Signed   By: Jeb Levering M.D.   On: 01/20/2015 00:16   Dg Chest Port 1 View  01/22/2015   CLINICAL DATA:  Pulmonary edema.  EXAM: PORTABLE CHEST - 1 VIEW  COMPARISON:  01/19/2015.  CT 10/06/2014.  FINDINGS: Cardiomegaly with normal pulmonary vascularity. No interim change. Persistent right base pleural fluid collection. Low lung volumes with persistent bibasilar atelectasis. No pneumothorax. No acute osseous abnormality .  IMPRESSION: 1. Persistent right base pleural fluid collection without significant change. Persistent bibasilar subsegmental atelectasis.  2. Persistent state cardiomegaly.  No pulmonary venous  congestion.   Electronically Signed   By: Marcello Moores  Register   On: 01/22/2015 07:33    Microbiology: Recent Results (from the past 240 hour(s))  MRSA PCR Screening     Status: None   Collection Time: 01/20/15  1:40 AM  Result Value Ref Range Status   MRSA by PCR NEGATIVE NEGATIVE Final    Comment:        The GeneXpert MRSA Assay (FDA approved for NASAL specimens only), is one component of a comprehensive MRSA colonization surveillance program. It is not intended to diagnose MRSA infection nor to guide or monitor treatment for MRSA infections.      Labs: Basic Metabolic Panel:  Recent Labs Lab 01/20/15 0820 01/21/15 4235 01/22/15 0048 01/23/15 0326 01/24/15 0311 01/25/15 0251  NA 141 140 140 137 135 136  K 4.2 4.1 3.8 4.0 3.7 4.1  CL 101 98* 95* 93* 93* 95*  CO2 35* 36* 38* 38* 36* 35*  GLUCOSE 187* 226* 155* 230* 220* 168*  BUN 17 16 17 20  23* 24*  CREATININE 0.89 0.87 0.93 1.11* 0.99 1.03*  CALCIUM 9.0 8.6* 8.7* 8.6* 8.7* 8.9  MG 1.8  --   --  1.8 1.8 1.8   Liver Function Tests:  Recent Labs Lab 01/19/15 2258 01/20/15 0820 01/21/15 0605  AST 18 17 14*  ALT 21 19 15   ALKPHOS 56 50 48  BILITOT 1.0 1.0 1.1  PROT 6.6 6.3* 5.2*  ALBUMIN 3.5 3.3* 3.0*   No results for input(s): LIPASE, AMYLASE in the last 168 hours. No results for input(s): AMMONIA in the last 168 hours. CBC:  Recent Labs Lab 01/19/15 2258 01/20/15 0820 01/21/15 0006  WBC 3.3* 4.1 2.9*  NEUTROABS 2.3  --   --   HGB 12.9 12.2 12.3  HCT 42.1 41.6 40.0  MCV 97.9 98.6 96.4  PLT 123* 120* 122*   Cardiac Enzymes: No results for input(s): CKTOTAL, CKMB, CKMBINDEX, TROPONINI in the last 168 hours. BNP: BNP (last 3 results)  Recent Labs  10/06/14 0340 01/19/15 2259  BNP 129.6* 180.3*    ProBNP (last 3 results) No results for input(s): PROBNP in the last 8760 hours.  CBG:  Recent Labs Lab 01/23/15 2236 01/24/15 0750 01/24/15 1137 01/24/15 1650 01/24/15 2200  GLUCAP 226*  170* 164* 109* 175*       Signed:  Dia Crawford, MD [facility] [phone] pager

## 2015-01-27 ENCOUNTER — Emergency Department (HOSPITAL_COMMUNITY): Payer: Medicare Other

## 2015-01-27 ENCOUNTER — Encounter (HOSPITAL_COMMUNITY): Payer: Self-pay | Admitting: Emergency Medicine

## 2015-01-27 ENCOUNTER — Observation Stay (HOSPITAL_COMMUNITY)
Admission: EM | Admit: 2015-01-27 | Discharge: 2015-01-28 | Disposition: A | Discharge status: Home / Self Care | Payer: Medicare Other | Attending: Internal Medicine | Admitting: Internal Medicine

## 2015-01-27 DIAGNOSIS — G40909 Epilepsy, unspecified, not intractable, without status epilepticus: Secondary | ICD-10-CM

## 2015-01-27 DIAGNOSIS — E669 Obesity, unspecified: Secondary | ICD-10-CM | POA: Insufficient documentation

## 2015-01-27 DIAGNOSIS — I4891 Unspecified atrial fibrillation: Secondary | ICD-10-CM | POA: Diagnosis not present

## 2015-01-27 DIAGNOSIS — Z880 Allergy status to penicillin: Secondary | ICD-10-CM | POA: Insufficient documentation

## 2015-01-27 DIAGNOSIS — Z79899 Other long term (current) drug therapy: Secondary | ICD-10-CM | POA: Insufficient documentation

## 2015-01-27 DIAGNOSIS — Z8673 Personal history of transient ischemic attack (TIA), and cerebral infarction without residual deficits: Secondary | ICD-10-CM | POA: Insufficient documentation

## 2015-01-27 DIAGNOSIS — I10 Essential (primary) hypertension: Secondary | ICD-10-CM | POA: Insufficient documentation

## 2015-01-27 DIAGNOSIS — K922 Gastrointestinal hemorrhage, unspecified: Secondary | ICD-10-CM | POA: Insufficient documentation

## 2015-01-27 DIAGNOSIS — Z86711 Personal history of pulmonary embolism: Secondary | ICD-10-CM | POA: Diagnosis present

## 2015-01-27 DIAGNOSIS — E1165 Type 2 diabetes mellitus with hyperglycemia: Secondary | ICD-10-CM | POA: Diagnosis present

## 2015-01-27 DIAGNOSIS — I5032 Chronic diastolic (congestive) heart failure: Secondary | ICD-10-CM

## 2015-01-27 DIAGNOSIS — I2699 Other pulmonary embolism without acute cor pulmonale: Secondary | ICD-10-CM | POA: Diagnosis not present

## 2015-01-27 DIAGNOSIS — F419 Anxiety disorder, unspecified: Secondary | ICD-10-CM | POA: Diagnosis not present

## 2015-01-27 DIAGNOSIS — D649 Anemia, unspecified: Secondary | ICD-10-CM | POA: Insufficient documentation

## 2015-01-27 DIAGNOSIS — E119 Type 2 diabetes mellitus without complications: Secondary | ICD-10-CM | POA: Insufficient documentation

## 2015-01-27 DIAGNOSIS — R7989 Other specified abnormal findings of blood chemistry: Secondary | ICD-10-CM

## 2015-01-27 DIAGNOSIS — R079 Chest pain, unspecified: Secondary | ICD-10-CM | POA: Diagnosis present

## 2015-01-27 DIAGNOSIS — D696 Thrombocytopenia, unspecified: Secondary | ICD-10-CM

## 2015-01-27 DIAGNOSIS — E662 Morbid (severe) obesity with alveolar hypoventilation: Secondary | ICD-10-CM | POA: Diagnosis present

## 2015-01-27 DIAGNOSIS — Z794 Long term (current) use of insulin: Secondary | ICD-10-CM | POA: Diagnosis not present

## 2015-01-27 DIAGNOSIS — K219 Gastro-esophageal reflux disease without esophagitis: Secondary | ICD-10-CM | POA: Insufficient documentation

## 2015-01-27 DIAGNOSIS — G2581 Restless legs syndrome: Secondary | ICD-10-CM | POA: Insufficient documentation

## 2015-01-27 DIAGNOSIS — R791 Abnormal coagulation profile: Principal | ICD-10-CM | POA: Insufficient documentation

## 2015-01-27 DIAGNOSIS — I509 Heart failure, unspecified: Secondary | ICD-10-CM | POA: Diagnosis not present

## 2015-01-27 DIAGNOSIS — I5042 Chronic combined systolic (congestive) and diastolic (congestive) heart failure: Secondary | ICD-10-CM | POA: Diagnosis present

## 2015-01-27 DIAGNOSIS — R933 Abnormal findings on diagnostic imaging of other parts of digestive tract: Secondary | ICD-10-CM

## 2015-01-27 DIAGNOSIS — IMO0002 Reserved for concepts with insufficient information to code with codable children: Secondary | ICD-10-CM | POA: Diagnosis present

## 2015-01-27 DIAGNOSIS — R0602 Shortness of breath: Secondary | ICD-10-CM | POA: Diagnosis present

## 2015-01-27 LAB — BASIC METABOLIC PANEL
ANION GAP: 7 (ref 5–15)
BUN: 28 mg/dL — ABNORMAL HIGH (ref 6–20)
CHLORIDE: 104 mmol/L (ref 101–111)
CO2: 27 mmol/L (ref 22–32)
Calcium: 9.4 mg/dL (ref 8.9–10.3)
Creatinine, Ser: 0.84 mg/dL (ref 0.44–1.00)
GFR calc Af Amer: >60 mL/min (ref >60)
GLUCOSE: 218 mg/dL — AB (ref 65–99)
POTASSIUM: 4.3 mmol/L (ref 3.5–5.1)
Sodium: 138 mmol/L (ref 135–145)

## 2015-01-27 LAB — TROPONIN I

## 2015-01-27 LAB — CBC WITH DIFFERENTIAL/PLATELET
BASOS ABS: 0 10*3/uL (ref 0.0–0.1)
Basophils Relative: 0 %
Eosinophils Absolute: 0.1 10*3/uL (ref 0.0–0.7)
Eosinophils Relative: 2 %
HEMATOCRIT: 41.9 % (ref 36.0–46.0)
Hemoglobin: 13.3 g/dL (ref 12.0–15.0)
LYMPHS ABS: 1.1 10*3/uL (ref 0.7–4.0)
LYMPHS PCT: 33 %
MCH: 30 pg (ref 26.0–34.0)
MCHC: 31.7 g/dL (ref 30.0–36.0)
MCV: 94.6 fL (ref 78.0–100.0)
MONO ABS: 0.3 10*3/uL (ref 0.1–1.0)
Monocytes Relative: 9 %
NEUTROS ABS: 1.9 10*3/uL (ref 1.7–7.7)
Neutrophils Relative %: 55 %
Platelets: 114 10*3/uL — ABNORMAL LOW (ref 150–400)
RBC: 4.43 MIL/uL (ref 3.87–5.11)
RDW: 14.3 % (ref 11.5–15.5)
WBC: 3.4 10*3/uL — ABNORMAL LOW (ref 4.0–10.5)

## 2015-01-27 LAB — GLUCOSE, CAPILLARY
Glucose-Capillary: 215 mg/dL — ABNORMAL HIGH (ref 65–99)
Glucose-Capillary: 194 mg/dL — ABNORMAL HIGH (ref 65–99)

## 2015-01-27 LAB — D-DIMER, QUANTITATIVE: D-Dimer, Quant: 1.71 ug/mL-FEU — ABNORMAL HIGH (ref 0.00–0.48)

## 2015-01-27 LAB — I-STAT TROPONIN, ED: Troponin i, poc: 0 ng/mL (ref 0.00–0.08)

## 2015-01-27 LAB — BRAIN NATRIURETIC PEPTIDE: B Natriuretic Peptide: 118.1 pg/mL — ABNORMAL HIGH (ref 0.0–100.0)

## 2015-01-27 MED ORDER — IOHEXOL 350 MG/ML SOLN
80.0000 mL | Freq: Once | INTRAVENOUS | Status: AC | PRN
  Administered 2015-01-27: 80 mL via INTRAVENOUS

## 2015-01-27 MED ORDER — ONDANSETRON HCL 4 MG PO TABS: 4.0000 mg | ORAL_TABLET | Freq: Four times a day (QID) | ORAL | Status: DC | PRN

## 2015-01-27 MED ORDER — PREGABALIN 25 MG PO CAPS
75.0000 mg | ORAL_CAPSULE | Freq: Two times a day (BID) | ORAL | Status: DC
  Administered 2015-01-27 – 2015-01-28 (×2): 75 mg via ORAL
  Filled 2015-01-27 (×4): qty 1

## 2015-01-27 MED ORDER — PANTOPRAZOLE SODIUM 40 MG PO TBEC
40.0000 mg | DELAYED_RELEASE_TABLET | Freq: Every day | ORAL | Status: DC
  Administered 2015-01-27: 40 mg via ORAL
  Filled 2015-01-27 (×2): qty 1

## 2015-01-27 MED ORDER — AMLODIPINE BESYLATE 5 MG PO TABS
5.0000 mg | ORAL_TABLET | Freq: Every day | ORAL | Status: DC
  Filled 2015-01-27: qty 1

## 2015-01-27 MED ORDER — MORPHINE SULFATE (PF) 2 MG/ML IV SOLN: 2.0000 mg | INTRAVENOUS | Status: DC | PRN

## 2015-01-27 MED ORDER — FUROSEMIDE 20 MG PO TABS
20.0000 mg | ORAL_TABLET | Freq: Two times a day (BID) | ORAL | Status: DC
  Administered 2015-01-27 – 2015-01-28 (×2): 20 mg via ORAL
  Filled 2015-01-27 (×2): qty 1

## 2015-01-27 MED ORDER — GI COCKTAIL ~~LOC~~: 30.0000 mL | Freq: Two times a day (BID) | ORAL | Status: DC | PRN

## 2015-01-27 MED ORDER — PANTOPRAZOLE SODIUM 40 MG PO TBEC: 40.0000 mg | DELAYED_RELEASE_TABLET | Freq: Every day | ORAL | Status: DC

## 2015-01-27 MED ORDER — DOXEPIN HCL 25 MG PO CAPS
25.0000 mg | ORAL_CAPSULE | Freq: Every day | ORAL | Status: DC
  Administered 2015-01-27: 25 mg via ORAL
  Filled 2015-01-27 (×2): qty 1

## 2015-01-27 MED ORDER — SODIUM CHLORIDE 0.9 % IJ SOLN
3.0000 mL | Freq: Two times a day (BID) | INTRAMUSCULAR | Status: DC
  Administered 2015-01-27 – 2015-01-28 (×2): 3 mL via INTRAVENOUS

## 2015-01-27 MED ORDER — ENOXAPARIN SODIUM 150 MG/ML ~~LOC~~ SOLN
1.0000 mg/kg | Freq: Two times a day (BID) | SUBCUTANEOUS | Status: DC
  Administered 2015-01-27: 125 mg via SUBCUTANEOUS
  Filled 2015-01-27 (×3): qty 1

## 2015-01-27 MED ORDER — ONDANSETRON HCL 4 MG/2ML IJ SOLN
4.0000 mg | Freq: Once | INTRAMUSCULAR | Status: AC
  Administered 2015-01-27: 4 mg via INTRAVENOUS
  Filled 2015-01-27: qty 2

## 2015-01-27 MED ORDER — ACETAMINOPHEN 650 MG RE SUPP: 650.0000 mg | Freq: Four times a day (QID) | RECTAL | Status: DC | PRN

## 2015-01-27 MED ORDER — INSULIN ASPART 100 UNIT/ML ~~LOC~~ SOLN
0.0000 [IU] | Freq: Three times a day (TID) | SUBCUTANEOUS | Status: DC
  Administered 2015-01-27: 3 [IU] via SUBCUTANEOUS
  Administered 2015-01-28 (×2): 2 [IU] via SUBCUTANEOUS

## 2015-01-27 MED ORDER — INFLUENZA VAC SPLIT QUAD 0.5 ML IM SUSY
0.5000 mL | PREFILLED_SYRINGE | INTRAMUSCULAR | Status: AC
Start: 2015-01-28 — End: 2015-01-28
  Administered 2015-01-28: 0.5 mL via INTRAMUSCULAR
  Filled 2015-01-27: qty 0.5

## 2015-01-27 MED ORDER — SODIUM CHLORIDE 0.9 % IV SOLN: 250.0000 mL | INTRAVENOUS | Status: DC | PRN

## 2015-01-27 MED ORDER — MORPHINE SULFATE (PF) 2 MG/ML IV SOLN
2.0000 mg | Freq: Once | INTRAVENOUS | Status: AC
  Administered 2015-01-27: 2 mg via INTRAVENOUS
  Filled 2015-01-27: qty 1

## 2015-01-27 MED ORDER — LEVETIRACETAM 500 MG PO TABS
500.0000 mg | ORAL_TABLET | Freq: Two times a day (BID) | ORAL | Status: DC
  Administered 2015-01-27 – 2015-01-28 (×2): 500 mg via ORAL
  Filled 2015-01-27 (×2): qty 1

## 2015-01-27 MED ORDER — HYDROXYZINE HCL 25 MG PO TABS
25.0000 mg | ORAL_TABLET | Freq: Two times a day (BID) | ORAL | Status: DC | PRN
  Administered 2015-01-28: 25 mg via ORAL
  Filled 2015-01-27: qty 1

## 2015-01-27 MED ORDER — SODIUM CHLORIDE 0.9 % IJ SOLN: 3.0000 mL | INTRAMUSCULAR | Status: DC | PRN

## 2015-01-27 MED ORDER — INSULIN ASPART 100 UNIT/ML ~~LOC~~ SOLN
3.0000 [IU] | Freq: Three times a day (TID) | SUBCUTANEOUS | Status: DC
  Administered 2015-01-27 – 2015-01-28 (×3): 3 [IU] via SUBCUTANEOUS

## 2015-01-27 MED ORDER — ROPINIROLE HCL 1 MG PO TABS
2.0000 mg | ORAL_TABLET | Freq: Four times a day (QID) | ORAL | Status: DC
  Administered 2015-01-27 – 2015-01-28 (×4): 2 mg via ORAL
  Filled 2015-01-27 (×5): qty 2

## 2015-01-27 MED ORDER — ONDANSETRON HCL 4 MG/2ML IJ SOLN: 4.0000 mg | Freq: Four times a day (QID) | INTRAMUSCULAR | Status: DC | PRN

## 2015-01-27 MED ORDER — CARVEDILOL PHOSPHATE ER 40 MG PO CP24
40.0000 mg | ORAL_CAPSULE | Freq: Every day | ORAL | Status: DC
  Filled 2015-01-27: qty 1

## 2015-01-27 MED ORDER — ALPRAZOLAM 0.25 MG PO TABS: 0.2500 mg | ORAL_TABLET | Freq: Two times a day (BID) | ORAL | Status: DC | PRN

## 2015-01-27 MED ORDER — ACETAMINOPHEN 325 MG PO TABS: 650.0000 mg | ORAL_TABLET | Freq: Four times a day (QID) | ORAL | Status: DC | PRN

## 2015-01-27 MED ORDER — INSULIN GLARGINE 100 UNIT/ML ~~LOC~~ SOLN
20.0000 [IU] | Freq: Every day | SUBCUTANEOUS | Status: DC
  Administered 2015-01-27: 20 [IU] via SUBCUTANEOUS
  Filled 2015-01-27 (×2): qty 0.2

## 2015-01-27 MED ORDER — IRBESARTAN 150 MG PO TABS
150.0000 mg | ORAL_TABLET | Freq: Every day | ORAL | Status: DC
  Filled 2015-01-27: qty 1

## 2015-01-27 NOTE — H&P (Signed)
[facility] History and Physical                                                                                    Elizabeth Pugh, is a 81 y.o. female  MRN: [id number]   DOB - April 14, 1927  Admit Date - 01/27/2015  Outpatient Primary MD for the patient is Philis Fendt, MD  Referring Physician:  Donnald Garre, P.A  Chief Complaint:   Chief Complaint  Patient presents with  . Chest Pain     HPI  Elizabeth Pugh  is a 81 y.o. female, brought to ED this am for chest pain and SOB. Patient was discharged from the hospital two days ago after admission for acute on chronic heart failure. Patient was awoken this am with diffuse chest pain radiating down left arm. This is a new pain for her. She complains of associated SOB. Pain relieved with Morphine in ED. Patient is bed bound. She has taken Eliquis for years and is compliant with home meds.   In ED today, troponin negative. CXR reveals cardiomegaly, no interstitial edema. EKG reveals atrial fibrillation ( no acute changes compared to last EKG). Heart Score is 5, D-dimer elevated. Chest CTA reveals non-occlusive right PE.   Review of Systems   In addition to the HPI above,  No Fever-chills, No Headache, No changes with Vision or hearing, No problems swallowing food or Liquids, No Abdominal pain, No Nausea or Vomiting, Bowel movements are regular, No Blood in stool or Urine, No dysuria, No new skin rashes or bruises, No new joints pains-aches,  No new weakness, tingling, numbness in any extremity, No recent weight gain or loss, A full 10 point Review of Systems was done, except as stated above, all other Review of Systems were negative.  Past Medical History  Past Medical History  Diagnosis Date  . Stroke   . GERD (gastroesophageal reflux disease)   . Hypertension   . Diabetes mellitus   . Blood transfusion   . Arthritis   . Anxiety   . Anemia   . GI (gastrointestinal bleed)     Due to benign mass  . Bronchitis   .  Restless leg syndrome   . Atrial fibrillation   . Acute delirium 04/12/2011  . Seizures   . CHF (congestive heart failure)     Past Surgical History  Procedure Laterality Date  . Eye surgery      Cataract removal  . Abdominal hysterectomy    . Tubal ligation    . Eus  09/24/2011    Procedure: UPPER ENDOSCOPIC ULTRASOUND (EUS) LINEAR;  Surgeon: Beryle Beams, MD;  Location: [facility];  Service: Endoscopy;  Laterality: N/A;     Social History Social History  Substance Use Topics  . Smoking status: Former Smoker    Types: Cigarettes    Quit date: 09/21/1985  . Smokeless tobacco: Never Used  . Alcohol Use: No   Resides at: SNF for rehab. Prior to that lived with daughter.  Lives with: daughter Ambulatory status: bedridden  Family History Family History  Problem Relation Age of Onset  . Aneurysm Mother   . Heart attack Father   .  Heart failure Sister   . Asthma Sister   . Kidney failure Sister   . Stroke Sister     Prior to Admission medications   Medication Sig Start Date End Date Taking? Authorizing Provider  albuterol (PROVENTIL HFA;VENTOLIN HFA) 108 (90 BASE) MCG/ACT inhaler Inhale 1 puff into the lungs every 6 (six) hours as needed for wheezing or shortness of breath.    Historical Provider, MD  apixaban (ELIQUIS) 2.5 MG TABS tablet Take 2.5 mg by mouth 2 (two) times daily.    Historical Provider, MD  calcium-vitamin D (CALCIUM 500+D) 500-400 MG-UNIT per tablet Take 1 tablet by mouth 2 (two) times daily.    Historical Provider, MD  carvedilol (COREG CR) 20 MG 24 hr capsule Take 1 capsule (20 mg total) by mouth daily. 01/25/15   [name], MD  doxepin (SINEQUAN) 25 MG capsule Take 25 mg by mouth at bedtime.    Historical Provider, MD  esomeprazole (NEXIUM) 40 MG capsule Take 40 mg by mouth daily as needed (heart burn).     Historical Provider, MD  ferrous sulfate 325 (65 FE) MG tablet Take 325 mg by mouth daily with breakfast.    Historical Provider, MD   furosemide (LASIX) 20 MG tablet Take 1 tablet (20 mg total) by mouth 2 (two) times daily. 01/25/15   [name], MD  glimepiride (AMARYL) 2 MG tablet Take 2 mg by mouth daily before breakfast.    Historical Provider, MD  hydrOXYzine (ATARAX/VISTARIL) 25 MG tablet Take 25 mg by mouth 2 (two) times daily as needed for itching.     Historical Provider, MD  insulin aspart (NOVOLOG) 100 UNIT/ML injection Inject 1-20 Units into the skin 3 (three) times daily before meals. 1 unit for every 20 over glucose of 150    Historical Provider, MD  Insulin Glargine (LANTUS) 100 UNIT/ML Solostar Pen Inject 25 Units into the skin daily at 10 pm. 01/25/15   [name], MD  Insulin Pen Needle (AURORA PEN NEEDLES) 31G X 8 MM MISC 25 Units by Does not apply route at bedtime. 01/25/15   [name], MD  ipratropium-albuterol (DUONEB) 0.5-2.5 (3) MG/3ML SOLN Take 3 mLs by nebulization every 6 (six) hours as needed. 01/25/15   [name], MD  levETIRAcetam (KEPPRA) 500 MG tablet Take 1 tablet (500 mg total) by mouth 2 (two) times daily. 02/27/12   Monika Salk, MD  rOPINIRole (REQUIP) 2 MG tablet Take 2 mg by mouth 4 (four) times daily.     Historical Provider, MD  VOLTAREN 1 % GEL Apply 1 application topically 4 (four) times daily as needed (knee pain). For pain 10/26/11   Historical Provider, MD    Allergies  Allergen Reactions  . Erythromycin Other (See Comments)    Vaginal itching  . Penicillins Rash  . Zithromax [Azithromycin] Other (See Comments)    gave her a yeast infection.    Physical Exam  Vitals  Blood pressure 132/62, pulse 75, temperature 97.8 F (36.6 C), temperature source Oral, resp. rate 18, SpO2 100 %.   General:  Pleasant obese black female lying in bed in NAD,   Psych:  Normal affect and insight, Not Suicidal or Homicidal, Awake Alert, Oriented X 3.  Neuro:   No F.N deficits, ALL C.Nerves Intact, Strength 5/5 all 4 extremities, Sensation intact all 4 extremities.  ENT:  Ears  and Eyes appear Normal, Conjunctivae red bilaterally.  PER. Moist oral mucosa without erythema or exudates.  Neck:  Supple, No lymphadenopathy appreciated  Respiratory:  Symmetrical chest wall movement, Good air movement bilaterally, CTAB.  Cardiac:  Irregular rhythm, regular rate. Pitting edema of BLE, R>L    Abdomen:  Soft, obese, non-tender, positive bowel sounds,  No masses appreciated  Skin:  No Cyanosis, Normal Skin Turgor, superficial skin tears in pannus fold.   Extremities:  Able to move all 4. 5/5 strength in each,  no effusions.  Data Review  CBC  Recent Labs Lab 01/21/15 0006 01/27/15 1052  WBC 2.9* 3.4*  HGB 12.3 13.3  HCT 40.0 41.9  PLT 122* 114*  MCV 96.4 94.6  MCH 29.6 30.0  MCHC 30.8 31.7  RDW 14.5 14.3  LYMPHSABS  --  1.1  MONOABS  --  0.3  EOSABS  --  0.1  BASOSABS  --  0.0    Chemistries   Recent Labs Lab 01/21/15 0605 01/22/15 0048 01/23/15 0326 01/24/15 0311 01/25/15 0251 01/27/15 1052  NA 140 140 137 135 136 138  K 4.1 3.8 4.0 3.7 4.1 4.3  CL 98* 95* 93* 93* 95* 104  CO2 36* 38* 38* 36* 35* 27  GLUCOSE 226* 155* 230* 220* 168* 218*  BUN 16 17 20  23* 24* 28*  CREATININE 0.87 0.93 1.11* 0.99 1.03* 0.84  CALCIUM 8.6* 8.7* 8.6* 8.7* 8.9 9.4  MG  --   --  1.8 1.8 1.8  --   AST 14*  --   --   --   --   --   ALT 15  --   --   --   --   --   ALKPHOS 48  --   --   --   --   --   BILITOT 1.1  --   --   --   --   --     estimated creatinine clearance is 65.6 mL/min (by C-G formula based on Cr of 0.84).    Recent Labs  01/27/15 1108  DDIMER 1.71*    Cardiac Enzymes  Recent Labs Lab 01/27/15 1108  TROPONINI <0.03    Imaging results:   Dg Chest 2 View  01/20/2015   CLINICAL DATA:  Shortness of breath.  Crackles and pitting edema.  EXAM: CHEST  2 VIEW  COMPARISON:  Radiographs and CT 10/05/2014  FINDINGS: Loculated right pleural effusion, unchanged from prior exam. Increased cardiomegaly. Lower lung volumes from prior.  Peribronchial cuffing, suspect pulmonary edema. No pneumothorax. Diffuse bony under mineralization. Chronic widening of the left acromioclavicular joint.  IMPRESSION: 1. Increased cardiomegaly with peribronchial cuffing, suspicious for pulmonary edema. Findings consistent with CHF. 2. Partially loculated right pleural effusion, grossly unchanged.   Electronically Signed   By: Jeb Levering M.D.   On: 01/20/2015 00:16   Ct Angio Chest Pe W/cm &/or Wo Cm  01/27/2015   CLINICAL DATA:  81 year old female with shortness of breath and chest pain that radiates across the chest starting at 8:45 this morning. Positive D-dimer. Evaluate for pulmonary embolism.  EXAM: CT ANGIOGRAPHY CHEST WITH CONTRAST  TECHNIQUE: Multidetector CT imaging of the chest was performed using the standard protocol during bolus administration of intravenous contrast. Multiplanar CT image reconstructions and MIPs were obtained to evaluate the vascular anatomy.  CONTRAST:  37mL OMNIPAQUE IOHEXOL 350 MG/ML SOLN  COMPARISON:  Chest CT 10/06/2014.  FINDINGS: Mediastinum/Lymph Nodes: Nonocclusive filling defect noted in distal segmental and subsegmental sized branch to the right lower lobe (image 154 of series 5), compatible with pulmonary embolism; this is new compared  to the recent prior study from 10/06/2014, but appears somewhat eccentric, suggesting a degree of chronicity. Heart size is mildly enlarged. There is no significant pericardial fluid, thickening or pericardial calcification. There is atherosclerosis of the thoracic aorta, the great vessels of the mediastinum and the coronary arteries, including calcified atherosclerotic plaque in the left main, left anterior descending and right coronary arteries. No pathologically enlarged mediastinal or hilar lymph nodes. Diffuse esophageal wall thickening. No axillary lymphadenopathy.  Lungs/Pleura: Small right-sided pleural effusion comment decreased in size compared to the prior examination  10/06/2014, potentially partially loculated. This is associated with some passive subsegmental atelectasis in the right lower lobe. No left pleural effusion. No acute consolidative airspace disease. No suspicious appearing pulmonary nodules or masses.  Upper Abdomen: Again noted is a 2 cm fatty attenuation lesion in the antral pre-pyloric region of the stomach, similar to prior study 09/22/2010, presumably a gastric lipoma or lipomatous polyp.  Musculoskeletal/Soft Tissues: There are no aggressive appearing lytic or blastic lesions noted in the visualized portions of the skeleton. Multiple old healed right-sided rib fractures.  Review of the MIP images confirms the above findings.  IMPRESSION: 1. Small nonocclusive embolus in distal segmental and subsegmental sized branches to the posterior right lower lobe, as above. 2. Decreased size of small right-sided pleural effusion which may be partially loculated. 3. Mild cardiomegaly. 4. Diffuse esophageal thickening. This may simply be related to reflux esophagitis. However, if there is any clinical concern for Barrett's metaplasia or esophageal neoplasia, further evaluation with nonemergent endoscopy should be considered. 5. Small lipoma or lipomatous polyp in the antral pre-pyloric region of the stomach, similar to prior study from 09/22/2010. 6. Atherosclerosis, including left main and 2 vessel coronary artery disease.   Electronically Signed   By: Vinnie Langton M.D.   On: 01/27/2015 12:54    EKG:  Atrial fibrillation Borderline intraventricular conduction delay Nonspecific T abnormalities, lateral leads No significant change since last tracingNo ST changes noted.   Assessment & Plan  Principal Problem:   Pulmonary embolism on right Active Problems:   Hypertension   Atrial fibrillation   Obesity hypoventilation syndrome   Diabetes type 2, uncontrolled   Seizures   Chest pain at rest   Shortness of breath at rest   Chronic diastolic heart  failure   PE (pulmonary embolism)   Thrombocytopenia     Pulmonary Embolism. Small non-occlusive right PE found on CTA. Patient presented with chest pain / SOB and elevated D-dimer. Patient at risk for PE due to being bedbound.  No leg pain. She has pitting edema in both lower extremities, left > right. Doppler study wouldn't change management so holding off for now. Interestingly she has been on Eliquis for years . Pharmacy consult placed for Lovenox. Patient is hemodynamically stable, not hypoxic. Will admit telemetry for observation.   Chronic diastolic heart failure. Uncertain whether patient has been taking lasix since hospital discharge two days ago. Will continue lasix 20mg  BID. Heart healthy diet,  Monitor weight, I&O and renal function.   Atrial fibrillation. Rate controlled. On chronic anticoagulation. Chads Vasc is 6. Continue home beta blocker  Chest pain: Possibly multifactorial (a-fib, PE, ?GERD). Doubt ACS. No acute abnormalities on EKG or CXR. Cycle troponins. On anticoagulant. Will add prn GI cocktail, Morphine and continue home PPI.   HTN. Stable, will continue home meds.   Abnormal findings on radiological examination of gastrointestinal tract. Diffuse esophageal wall thickening. On chronic PPI, denies breakthrough GERD symptoms. No dysphagia. Continue PPI.  Diabetes. Hold oral agents, continue insulin.   Seizures. Continue Keppra 500mg  BID  Thrombocytopenia. She is mildly neutropenic as well. These are chronic findings. Consider outpatient hematology evaluation.     Consultants Called:  None  Family Communication:   Patient alert, oriented and understands plan of care.   Code Status:  DNR. Discussed with patient and daughter.   Condition:  Guarded  Potential Disposition:  24-48 hours  Time spent in minutes : [place],  PA-C on 01/27/2015 at 1:17 PM Between 7am to 7pm - Pager - [phone] After 7pm go to [email or web address] - password [email or web address] And look for  the night coverage person covering me after hours  [facility]

## 2015-01-27 NOTE — Consult Note (Signed)
ANTICOAGULATION CONSULT NOTE - Initial Consult  Pharmacy Consult for enoxaparin Indication: pulmonary embolus  Allergies  Allergen Reactions  . Erythromycin Other (See Comments)    Vaginal itching  . Penicillins Rash  . Zithromax [Azithromycin] Other (See Comments)    gave her a yeast infection.   Vital Signs: Temp: 97.8 F (36.6 C) (09/25 1017) Temp Source: Oral (09/25 1017) BP: 105/54 mmHg (09/25 1330) Pulse Rate: 73 (09/25 1330)  Labs:  Recent Labs  01/25/15 0251 01/27/15 1052 01/27/15 1108  HGB  --  13.3  --   HCT  --  41.9  --   PLT  --  114*  --   CREATININE 1.03* 0.84  --   TROPONINI  --   --  <0.03    Estimated Creatinine Clearance: 65.6 mL/min (by C-G formula based on Cr of 0.84).   Medical History: Past Medical History  Diagnosis Date  . Stroke   . GERD (gastroesophageal reflux disease)   . Hypertension   . Diabetes mellitus   . Blood transfusion   . Arthritis   . Anxiety   . Anemia   . GI (gastrointestinal bleed)     Due to benign mass  . Bronchitis   . Restless leg syndrome   . Atrial fibrillation   . Acute delirium 04/12/2011  . Seizures   . CHF (congestive heart failure)    Assessment: 81 yo female on apixaban pta with new PE  PMH: stroke, gerd, HTN, DM, a fib, seizures, CHF  AC: on apixaban pta. New onset PE.  Renal: SCr 0.84  Heme: H&H 13.3/41.9, D Dimer 1.71, Plt 114  Goal of Therapy:  Anti-Xa level 0.6-1 units/ml 4hrs after LMWH dose given Monitor platelets by anticoagulation protocol: Yes   Plan:  Lovenox 125 mg q12h CBC q72h  Levester Fresh, PharmD, BCPS Clinical Pharmacist Pager [phone] 01/27/2015 2:24 PM

## 2015-01-27 NOTE — ED Notes (Signed)
Per EMS- pt here from [facility] facility. Pt here recently for shortness of breathe. Pt currently reports chest pain radiating across her chest that started around 845. Pt received asprin and nitro X 2. Pt reports relief. Pt runs in afib with hx of same 80s-100s. Pt placed on 2L [place]. PIV 20G placed to L hand.

## 2015-01-27 NOTE — ED Notes (Signed)
Heart healthy meal tray ordered 

## 2015-01-27 NOTE — ED Provider Notes (Signed)
CSN: [id number]     Arrival date & time 01/27/15  1010 History   First Elizabeth Pugh Initiated Contact with Patient 01/27/15 1027     Chief Complaint  Patient presents with  . Chest Pain     (Consider location/radiation/quality/duration/timing/severity/associated sxs/prior Treatment) HPI Comments: Elizabeth Pugh is an 81 y.o F with a pmhx of CVA, morbid obesity, CHF, atrial fibrillation, obesity hypoventilation syndrome, hypertension and bronchitis who presents to the ED today c/o left sided chest pain that began this morning. Patient is a poor historian. Pt states that pain in her chest woke her from sleep and began radiating down her left arm. Pt has never experienced this before. No hx of MI or PE. Pt has associated SOB that is increased from her baseline. Per EMS pt O2 sat was 89% on arrival, placed on 2L O2. Pt given 324 ASA and 2 nitro which provided mild pain relief. However, pt states pain is now returning. Pt has been bed bound for 3 years due to chronic knee pain. Denies syncope, weakness, numbness, nausea, leg swelling, abdominal pain, lightheadedness, dizziness.  Of note pt was recently discharged 9/23 from a 7 day admission to the hospital for CHF exacerbation.  Patient is a 81 y.o. female presenting with chest pain.  Chest Pain   Past Medical History  Diagnosis Date  . Stroke   . GERD (gastroesophageal reflux disease)   . Hypertension   . Diabetes mellitus   . Blood transfusion   . Arthritis   . Anxiety   . Anemia   . GI (gastrointestinal bleed)     Due to benign mass  . Bronchitis   . Restless leg syndrome   . Atrial fibrillation   . Acute delirium 04/12/2011  . Seizures   . CHF (congestive heart failure)    Past Surgical History  Procedure Laterality Date  . Eye surgery      Cataract removal  . Abdominal hysterectomy    . Tubal ligation    . Eus  09/24/2011    Procedure: UPPER ENDOSCOPIC ULTRASOUND (EUS) LINEAR;  Surgeon: Beryle Beams, Elizabeth Pugh;  Location: [facility];   Service: Endoscopy;  Laterality: N/A;   Family History  Problem Relation Age of Onset  . Aneurysm Mother   . Heart attack Father   . Heart failure Sister   . Asthma Sister   . Kidney failure Sister   . Stroke Sister    Social History  Substance Use Topics  . Smoking status: Former Smoker    Types: Cigarettes    Quit date: 09/21/1985  . Smokeless tobacco: Never Used  . Alcohol Use: No   OB History    No data available     Review of Systems  Cardiovascular: Positive for chest pain.  All other systems reviewed and are negative.     Allergies  Erythromycin; Penicillins; and Zithromax  Home Medications   Prior to Admission medications   Medication Sig Start Date End Date Taking? Authorizing Provider  albuterol (PROVENTIL HFA;VENTOLIN HFA) 108 (90 BASE) MCG/ACT inhaler Inhale 1 puff into the lungs every 6 (six) hours as needed for wheezing or shortness of breath.    Historical Provider, Elizabeth Pugh  apixaban (ELIQUIS) 2.5 MG TABS tablet Take 2.5 mg by mouth 2 (two) times daily.    Historical Provider, Elizabeth Pugh  calcium-vitamin D (CALCIUM 500+D) 500-400 MG-UNIT per tablet Take 1 tablet by mouth 2 (two) times daily.    Historical Provider, Elizabeth Pugh  carvedilol (COREG CR) 20 MG 24 hr  capsule Take 1 capsule (20 mg total) by mouth daily. 01/25/15   [name], Elizabeth Pugh  doxepin (SINEQUAN) 25 MG capsule Take 25 mg by mouth at bedtime.    Historical Provider, Elizabeth Pugh  esomeprazole (NEXIUM) 40 MG capsule Take 40 mg by mouth daily as needed (heart burn).     Historical Provider, Elizabeth Pugh  ferrous sulfate 325 (65 FE) MG tablet Take 325 mg by mouth daily with breakfast.    Historical Provider, Elizabeth Pugh  furosemide (LASIX) 20 MG tablet Take 1 tablet (20 mg total) by mouth 2 (two) times daily. 01/25/15   [name], Elizabeth Pugh  glimepiride (AMARYL) 2 MG tablet Take 2 mg by mouth daily before breakfast.    Historical Provider, Elizabeth Pugh  hydrOXYzine (ATARAX/VISTARIL) 25 MG tablet Take 25 mg by mouth 2 (two) times daily as needed for itching.      Historical Provider, Elizabeth Pugh  insulin aspart (NOVOLOG) 100 UNIT/ML injection Inject 1-20 Units into the skin 3 (three) times daily before meals. 1 unit for every 20 over glucose of 150    Historical Provider, Elizabeth Pugh  Insulin Glargine (LANTUS) 100 UNIT/ML Solostar Pen Inject 25 Units into the skin daily at 10 pm. 01/25/15   [name], Elizabeth Pugh  Insulin Pen Needle (AURORA PEN NEEDLES) 31G X 8 MM MISC 25 Units by Does not apply route at bedtime. 01/25/15   [name], Elizabeth Pugh  ipratropium-albuterol (DUONEB) 0.5-2.5 (3) MG/3ML SOLN Take 3 mLs by nebulization every 6 (six) hours as needed. 01/25/15   [name], Elizabeth Pugh  levETIRAcetam (KEPPRA) 500 MG tablet Take 1 tablet (500 mg total) by mouth 2 (two) times daily. 02/27/12   Monika Salk, Elizabeth Pugh  rOPINIRole (REQUIP) 2 MG tablet Take 2 mg by mouth 4 (four) times daily.     Historical Provider, Elizabeth Pugh  VOLTAREN 1 % GEL Apply 1 application topically 4 (four) times daily as needed (knee pain). For pain 10/26/11   Historical Provider, Elizabeth Pugh   BP 110/67 mmHg  Pulse 75  Temp(Src) 97.8 F (36.6 C) (Oral)  Resp 15  SpO2 98% Physical Exam  Constitutional: She is oriented to person, place, and time. She appears well-developed and well-nourished. No distress.  Ill appearing, obese female on oxygen laying in bed.. Drowsy.  HENT:  Head: Normocephalic and atraumatic.  Mouth/Throat: Oropharynx is clear and moist. No oropharyngeal exudate.  Eyes: Conjunctivae and EOM are normal. Pupils are equal, round, and reactive to light. Right eye exhibits no discharge. Left eye exhibits no discharge. No scleral icterus.  Cardiovascular: Normal rate, regular rhythm, normal heart sounds and intact distal pulses.  Exam reveals no gallop and no friction rub.   No murmur heard. Pulmonary/Chest: Effort normal and breath sounds normal. No respiratory distress. She has no wheezes. She has no rales. She exhibits no tenderness.  Abdominal: Soft. Bowel sounds are normal. She exhibits no distension and no  mass. There is no tenderness. There is no rebound and no guarding.  Musculoskeletal: Normal range of motion. She exhibits edema.  Trace lower extremity edema.  Neurological: She is alert and oriented to person, place, and time.  Patient very weak but reports that is her baseline. Patient is bedbound. No sensory deficits.    Skin: Skin is warm and dry. No rash noted. She is not diaphoretic. No erythema. No pallor.  Psychiatric: She has a normal mood and affect. Her behavior is normal.  Nursing note and vitals reviewed.   ED Course  Procedures (including critical care time) Labs  Review Labs Reviewed  BASIC METABOLIC PANEL - Abnormal; Notable for the following:    Glucose, Bld 218 (*)    BUN 28 (*)    All other components within normal limits  CBC WITH DIFFERENTIAL/PLATELET - Abnormal; Notable for the following:    WBC 3.4 (*)    Platelets 114 (*)    All other components within normal limits  BRAIN NATRIURETIC PEPTIDE - Abnormal; Notable for the following:    B Natriuretic Peptide 118.1 (*)    All other components within normal limits  D-DIMER, QUANTITATIVE (NOT AT [facility]) - Abnormal; Notable for the following:    D-Dimer, Quant 1.71 (*)    All other components within normal limits  TROPONIN I  I-STAT TROPOININ, ED    Imaging Review Ct Angio Chest Pe W/cm &/or Wo Cm  01/27/2015   CLINICAL DATA:  81 year old female with shortness of breath and chest pain that radiates across the chest starting at 8:45 this morning. Positive D-dimer. Evaluate for pulmonary embolism.  EXAM: CT ANGIOGRAPHY CHEST WITH CONTRAST  TECHNIQUE: Multidetector CT imaging of the chest was performed using the standard protocol during bolus administration of intravenous contrast. Multiplanar CT image reconstructions and MIPs were obtained to evaluate the vascular anatomy.  CONTRAST:  50mL OMNIPAQUE IOHEXOL 350 MG/ML SOLN  COMPARISON:  Chest CT 10/06/2014.  FINDINGS: Mediastinum/Lymph Nodes: Nonocclusive filling defect  noted in distal segmental and subsegmental sized branch to the right lower lobe (image 154 of series 5), compatible with pulmonary embolism; this is new compared to the recent prior study from 10/06/2014, but appears somewhat eccentric, suggesting a degree of chronicity. Heart size is mildly enlarged. There is no significant pericardial fluid, thickening or pericardial calcification. There is atherosclerosis of the thoracic aorta, the great vessels of the mediastinum and the coronary arteries, including calcified atherosclerotic plaque in the left main, left anterior descending and right coronary arteries. No pathologically enlarged mediastinal or hilar lymph nodes. Diffuse esophageal wall thickening. No axillary lymphadenopathy.  Lungs/Pleura: Small right-sided pleural effusion comment decreased in size compared to the prior examination 10/06/2014, potentially partially loculated. This is associated with some passive subsegmental atelectasis in the right lower lobe. No left pleural effusion. No acute consolidative airspace disease. No suspicious appearing pulmonary nodules or masses.  Upper Abdomen: Again noted is a 2 cm fatty attenuation lesion in the antral pre-pyloric region of the stomach, similar to prior study 09/22/2010, presumably a gastric lipoma or lipomatous polyp.  Musculoskeletal/Soft Tissues: There are no aggressive appearing lytic or blastic lesions noted in the visualized portions of the skeleton. Multiple old healed right-sided rib fractures.  Review of the MIP images confirms the above findings.  IMPRESSION: 1. Small nonocclusive embolus in distal segmental and subsegmental sized branches to the posterior right lower lobe, as above. 2. Decreased size of small right-sided pleural effusion which may be partially loculated. 3. Mild cardiomegaly. 4. Diffuse esophageal thickening. This may simply be related to reflux esophagitis. However, if there is any clinical concern for Barrett's metaplasia or  esophageal neoplasia, further evaluation with nonemergent endoscopy should be considered. 5. Small lipoma or lipomatous polyp in the antral pre-pyloric region of the stomach, similar to prior study from 09/22/2010. 6. Atherosclerosis, including left main and 2 vessel coronary artery disease.   Electronically Signed   By: Vinnie Langton M.D.   On: 01/27/2015 12:54   Dg Chest Portable 1 View  01/27/2015   CLINICAL DATA:  Shortness of breath, chest pain  EXAM: PORTABLE CHEST 1 VIEW  COMPARISON:  01/22/2015  FINDINGS: Cardiomegaly.  No frank interstitial edema. No focal consolidation. No pleural effusion or pneumothorax.  IMPRESSION: Cardiomegaly.  No frank interstitial edema.   Electronically Signed   By: Julian Hy M.D.   On: 01/27/2015 11:02   I have personally reviewed and evaluated these images and lab results as part of my medical decision-making.   EKG Interpretation   Date/Time:  Sunday January 27 2015 10:17:56 EDT Ventricular Rate:  77 PR Interval:    QRS Duration: 113 QT Interval:  373 QTC Calculation: 422 R Axis:   67 Text Interpretation:  Atrial fibrillation Borderline intraventricular  conduction delay Nonspecific T abnormalities, lateral leads No significant  change since last tracing Confirmed by [facility] Elizabeth Pugh, Corene Cornea [phone]) on  01/27/2015 10:21:31 AM      MDM   Final diagnoses:  Positive D dimer    Patient with multiple comorbidities presented today complaining of chest pain sudden onset and associated shortness of breath. EKG unchanged from last 1. Chronic A. fib. Troponin negative. D-dimer positive to 1.7. CTA reveals small nonocclusive embolus in distal segmental and subsegmental sized branches to posterior right lower lobe.1:36 PM Spoke with hospitalist who will consult patient immediately. Do not recommend intervention at this time. Hospitalist will admit patient to their service. Vital signs stable.    Dondra Spry Spurgeon, PA-C 01/27/15 1614  Elizabeth Pew, Elizabeth Pugh 01/28/15 2221

## 2015-01-27 NOTE — ED Notes (Signed)
Admitting at bedside 

## 2015-01-28 DIAGNOSIS — I10 Essential (primary) hypertension: Secondary | ICD-10-CM | POA: Diagnosis not present

## 2015-01-28 DIAGNOSIS — I2699 Other pulmonary embolism without acute cor pulmonale: Secondary | ICD-10-CM | POA: Diagnosis not present

## 2015-01-28 DIAGNOSIS — E1165 Type 2 diabetes mellitus with hyperglycemia: Secondary | ICD-10-CM

## 2015-01-28 DIAGNOSIS — R791 Abnormal coagulation profile: Secondary | ICD-10-CM | POA: Diagnosis not present

## 2015-01-28 LAB — BASIC METABOLIC PANEL
Anion gap: 6 (ref 5–15)
BUN: 30 mg/dL — AB (ref 6–20)
CHLORIDE: 101 mmol/L (ref 101–111)
CO2: 32 mmol/L (ref 22–32)
Calcium: 9.4 mg/dL (ref 8.9–10.3)
Creatinine, Ser: 1.1 mg/dL — ABNORMAL HIGH (ref 0.44–1.00)
GFR calc Af Amer: 51 mL/min — ABNORMAL LOW (ref >60)
GFR calc non Af Amer: 44 mL/min — ABNORMAL LOW (ref >60)
Glucose, Bld: 216 mg/dL — ABNORMAL HIGH (ref 65–99)
POTASSIUM: 4.6 mmol/L (ref 3.5–5.1)
SODIUM: 139 mmol/L (ref 135–145)

## 2015-01-28 LAB — GLUCOSE, CAPILLARY
GLUCOSE-CAPILLARY: 190 mg/dL — AB (ref 65–99)
GLUCOSE-CAPILLARY: 172 mg/dL — AB (ref 65–99)

## 2015-01-28 LAB — TROPONIN I: Troponin I: <0.03 ng/mL (ref <0.031)

## 2015-01-28 MED ORDER — APIXABAN 5 MG PO TABS: ORAL_TABLET | ORAL | Status: DC

## 2015-01-28 MED ORDER — PANTOPRAZOLE SODIUM 40 MG PO TBEC: 40.0000 mg | DELAYED_RELEASE_TABLET | Freq: Every day | ORAL | Status: DC

## 2015-01-28 MED ORDER — TELMISARTAN 40 MG PO TABS: 40.0000 mg | ORAL_TABLET | Freq: Every day | ORAL | Status: DC

## 2015-01-28 MED ORDER — APIXABAN 5 MG PO TABS
10.0000 mg | ORAL_TABLET | Freq: Once | ORAL | Status: AC
Start: 2015-01-28 — End: 2015-01-28
  Administered 2015-01-28: 10 mg via ORAL
  Filled 2015-01-28: qty 2

## 2015-01-28 MED ORDER — APIXABAN 5 MG PO TABS: 5.0000 mg | ORAL_TABLET | Freq: Two times a day (BID) | ORAL | Status: DC

## 2015-01-28 MED ORDER — ENOXAPARIN SODIUM 150 MG/ML ~~LOC~~ SOLN
1.0000 mg/kg | Freq: Two times a day (BID) | SUBCUTANEOUS | Status: DC
  Administered 2015-01-28: 125 mg via SUBCUTANEOUS
  Filled 2015-01-28: qty 1

## 2015-01-28 MED ORDER — IRBESARTAN 75 MG PO TABS: 75.0000 mg | ORAL_TABLET | Freq: Every day | ORAL | Status: DC

## 2015-01-28 MED ORDER — APIXABAN 5 MG PO TABS: 10.0000 mg | ORAL_TABLET | Freq: Two times a day (BID) | ORAL | Status: DC

## 2015-01-28 MED ORDER — ALPRAZOLAM 0.25 MG PO TABS: 0.2500 mg | ORAL_TABLET | Freq: Two times a day (BID) | ORAL | Status: DC | PRN

## 2015-01-28 MED ORDER — INSULIN GLARGINE 100 UNIT/ML ~~LOC~~ SOLN: 20.0000 [IU] | Freq: Every day | SUBCUTANEOUS | Status: DC

## 2015-01-28 NOTE — Discharge Summary (Signed)
Physician Discharge Summary  Elizabeth Pugh:[id number] DOB: [birth year] DOA: 01/27/2015  PCP: Philis Fendt, MD  Admit date: 01/27/2015 Discharge date: 01/28/2015  Time spent: 35 minutes  Recommendations for Outpatient Follow-up:  Apixaban 10 mg PO bid for 14 doses then 5 mg PO bid Monitor BP before giving BP meds   Discharge Diagnoses:  Principal Problem:   Pulmonary embolism on right Active Problems:   Hypertension   Atrial fibrillation   Obesity hypoventilation syndrome   Diabetes type 2, uncontrolled   Seizures   Chest pain at rest   Shortness of breath at rest   Chronic diastolic heart failure   PE (pulmonary embolism)   Thrombocytopenia   Abnormal findings on radiological examination of gastrointestinal tract   Pulmonary embolism   Discharge Condition: improved  Diet recommendation: cardiac/diabetic  Filed Weights   01/27/15 1607 01/28/15 0449  Weight: 123.288 kg (271 lb 12.8 oz) 122.471 kg (270 lb)    History of present illness:  Elizabeth Pugh is a 81 y.o. female, brought to ED this am for chest pain and SOB. Patient was discharged from the hospital two days ago after admission for acute on chronic heart failure. Patient was awoken this am with diffuse chest pain radiating down left arm. This is a new pain for her. She complains of associated SOB. Pain relieved with Morphine in ED. Patient is bed bound. She has taken Eliquis for years and is compliant with home meds.   In ED today, troponin negative. CXR reveals cardiomegaly, no interstitial edema. EKG reveals atrial fibrillation ( no acute changes compared to last EKG). Heart Score is 5, D-dimer elevated. Chest CTA reveals non-occlusive right PE.    Hospital Course:  Pulmonary Embolism. Small non-occlusive right PE found on CTA. Patient presented with chest pain / SOB and elevated D-dimer. Patient at risk for PE due to being bedbound. No leg pain. She has pitting edema in both lower extremities, left >  right. Doppler study wouldn't change management   Interestingly she has been on Eliquis for years but has been underdosed  -chest pain free  Chronic diastolic heart failure.   continue lasix 20mg  BID.   Monitor weight BMp 1 week   Atrial fibrillation. Rate controlled. On chronic anticoagulation. Chads Vasc is 6. Continue home beta blocker  Chest pain: resolved, CE negative  HTN. Stable, will continue home meds.   Abnormal findings on radiological examination of gastrointestinal tract. Diffuse esophageal wall thickening. On chronic PPI, denies breakthrough GERD symptoms. No dysphagia. Continue PPI.   Diabetes. Hold oral agents, continue insulin.   Seizures. Continue Keppra 500mg  BID  Thrombocytopenia. She is mildly neutropenic as well. These are chronic findings. Consider outpatient hematology evaluation  Procedures:    Consultations:    Discharge Exam: Filed Vitals:   01/28/15 1255  BP: 109/78  Pulse:   Temp:   Resp:     General: awake, NAD- anxious to get back to rehab Cardiovascular: rrr Respiratory: clear  Discharge Instructions   Discharge Instructions    (HEART FAILURE PATIENTS) Call MD:  Anytime you have any of the following symptoms: 1) 3 pound weight gain in 24 hours or 5 pounds in 1 week 2) shortness of breath, with or without a dry hacking cough 3) swelling in the hands, feet or stomach 4) if you have to sleep on extra pillows at night in order to breathe.    Complete by:  As directed      Diet - low sodium heart healthy  Complete by:  As directed      Diet Carb Modified    Complete by:  As directed      Discharge instructions    Complete by:  As directed   Check BP before giving meds- hold for SBP< 100 or pulse <60 Cbc, bmp 1week Daily weights     Increase activity slowly    Complete by:  As directed           Current Discharge Medication List    START taking these medications   Details  ALPRAZolam (XANAX) 0.25 MG tablet Take 1 tablet  (0.25 mg total) by mouth 2 (two) times daily as needed for anxiety. Qty: 30 tablet, Refills: 0    insulin glargine (LANTUS) 100 UNIT/ML injection Inject 0.2 mLs (20 Units total) into the skin at bedtime. Qty: 10 mL, Refills: 11    pantoprazole (PROTONIX) 40 MG tablet Take 1 tablet (40 mg total) by mouth daily at 6 (six) AM.      CONTINUE these medications which have CHANGED   Details  !! apixaban (ELIQUIS) 5 MG TABS tablet 10 mg BID for 14 doses Qty: 60 tablet    !! apixaban (ELIQUIS) 5 MG TABS tablet Take 1 tablet (5 mg total) by mouth every 12 (twelve) hours. Qty: 60 tablet    telmisartan (MICARDIS) 40 MG tablet Take 1 tablet (40 mg total) by mouth daily. Refills: 3     !! - Potential duplicate medications found. Please discuss with provider.    CONTINUE these medications which have NOT CHANGED   Details  albuterol (PROVENTIL HFA;VENTOLIN HFA) 108 (90 BASE) MCG/ACT inhaler Inhale 1 puff into the lungs every 6 (six) hours as needed for wheezing or shortness of breath.    calcium-vitamin D (CALCIUM 500+D) 500-400 MG-UNIT per tablet Take 1 tablet by mouth 2 (two) times daily.    carvedilol (COREG CR) 20 MG 24 hr capsule Take 1 capsule (20 mg total) by mouth daily. Qty: 30 capsule, Refills: 0    doxepin (SINEQUAN) 25 MG capsule Take 25 mg by mouth at bedtime.    esomeprazole (NEXIUM) 40 MG capsule Take 40 mg by mouth daily as needed (heart burn).     ferrous sulfate 325 (65 FE) MG tablet Take 325 mg by mouth daily with breakfast.    glimepiride (AMARYL) 2 MG tablet Take 2 mg by mouth daily before breakfast.    hydrOXYzine (ATARAX/VISTARIL) 25 MG tablet Take 25 mg by mouth 2 (two) times daily as needed for itching.     insulin aspart (NOVOLOG) 100 UNIT/ML injection Inject 0-12 Units into the skin 3 (three) times daily before meals. Under 150 = 0 units, 150 - 200 = 2 units, 201- 250 = 4 units, 251- 300 = 6 units, 301- 350 = 8 units, 351-400 = 10 units, 401-450 = 12  units     levETIRAcetam (KEPPRA) 500 MG tablet Take 1 tablet (500 mg total) by mouth 2 (two) times daily. Qty: 30 tablet, Refills: 2    pregabalin (LYRICA) 75 MG capsule Take 75 mg by mouth 2 (two) times daily.    rOPINIRole (REQUIP) 2 MG tablet Take 2 mg by mouth 4 (four) times daily.     VOLTAREN 1 % GEL Apply 1 application topically 4 (four) times daily as needed (knee pain). For pain    furosemide (LASIX) 20 MG tablet Take 1 tablet (20 mg total) by mouth 2 (two) times daily. Qty: 60 tablet, Refills: 0    ipratropium-albuterol (  DUONEB) 0.5-2.5 (3) MG/3ML SOLN Take 3 mLs by nebulization every 6 (six) hours as needed. Qty: 360 mL, Refills: 0      STOP taking these medications     amLODipine (NORVASC) 5 MG tablet      Insulin Glargine (LANTUS) 100 UNIT/ML Solostar Pen        Allergies  Allergen Reactions  . Erythromycin Other (See Comments)    Vaginal itching  . Penicillins Rash    Has patient had a PCN reaction causing immediate rash, facial/tongue/throat swelling, SOB or lightheadedness with hypotension: No Has patient had a PCN reaction causing severe rash involving mucus membranes or skin necrosis: No Has patient had a PCN reaction that required hospitalization: No Has patient had a PCN reaction occurring within the last 10 years: No If all of the above answers are "NO", then may proceed with Cephalosporin use.  Marland Kitchen Zithromax [Azithromycin] Other (See Comments)    gave her a yeast infection.      The results of significant diagnostics from this hospitalization (including imaging, microbiology, ancillary and laboratory) are listed below for reference.    Significant Diagnostic Studies: Dg Chest 2 View  01/20/2015   CLINICAL DATA:  Shortness of breath.  Crackles and pitting edema.  EXAM: CHEST  2 VIEW  COMPARISON:  Radiographs and CT 10/05/2014  FINDINGS: Loculated right pleural effusion, unchanged from prior exam. Increased cardiomegaly. Lower lung volumes from prior.  Peribronchial cuffing, suspect pulmonary edema. No pneumothorax. Diffuse bony under mineralization. Chronic widening of the left acromioclavicular joint.  IMPRESSION: 1. Increased cardiomegaly with peribronchial cuffing, suspicious for pulmonary edema. Findings consistent with CHF. 2. Partially loculated right pleural effusion, grossly unchanged.   Electronically Signed   By: Jeb Levering M.D.   On: 01/20/2015 00:16   Ct Angio Chest Pe W/cm &/or Wo Cm  01/27/2015   CLINICAL DATA:  81 year old female with shortness of breath and chest pain that radiates across the chest starting at 8:45 this morning. Positive D-dimer. Evaluate for pulmonary embolism.  EXAM: CT ANGIOGRAPHY CHEST WITH CONTRAST  TECHNIQUE: Multidetector CT imaging of the chest was performed using the standard protocol during bolus administration of intravenous contrast. Multiplanar CT image reconstructions and MIPs were obtained to evaluate the vascular anatomy.  CONTRAST:  40mL OMNIPAQUE IOHEXOL 350 MG/ML SOLN  COMPARISON:  Chest CT 10/06/2014.  FINDINGS: Mediastinum/Lymph Nodes: Nonocclusive filling defect noted in distal segmental and subsegmental sized branch to the right lower lobe (image 154 of series 5), compatible with pulmonary embolism; this is new compared to the recent prior study from 10/06/2014, but appears somewhat eccentric, suggesting a degree of chronicity. Heart size is mildly enlarged. There is no significant pericardial fluid, thickening or pericardial calcification. There is atherosclerosis of the thoracic aorta, the great vessels of the mediastinum and the coronary arteries, including calcified atherosclerotic plaque in the left main, left anterior descending and right coronary arteries. No pathologically enlarged mediastinal or hilar lymph nodes. Diffuse esophageal wall thickening. No axillary lymphadenopathy.  Lungs/Pleura: Small right-sided pleural effusion comment decreased in size compared to the prior examination  10/06/2014, potentially partially loculated. This is associated with some passive subsegmental atelectasis in the right lower lobe. No left pleural effusion. No acute consolidative airspace disease. No suspicious appearing pulmonary nodules or masses.  Upper Abdomen: Again noted is a 2 cm fatty attenuation lesion in the antral pre-pyloric region of the stomach, similar to prior study 09/22/2010, presumably a gastric lipoma or lipomatous polyp.  Musculoskeletal/Soft Tissues: There are no aggressive appearing  lytic or blastic lesions noted in the visualized portions of the skeleton. Multiple old healed right-sided rib fractures.  Review of the MIP images confirms the above findings.  IMPRESSION: 1. Small nonocclusive embolus in distal segmental and subsegmental sized branches to the posterior right lower lobe, as above. 2. Decreased size of small right-sided pleural effusion which may be partially loculated. 3. Mild cardiomegaly. 4. Diffuse esophageal thickening. This may simply be related to reflux esophagitis. However, if there is any clinical concern for Barrett's metaplasia or esophageal neoplasia, further evaluation with nonemergent endoscopy should be considered. 5. Small lipoma or lipomatous polyp in the antral pre-pyloric region of the stomach, similar to prior study from 09/22/2010. 6. Atherosclerosis, including left main and 2 vessel coronary artery disease.   Electronically Signed   By: Vinnie Langton M.D.   On: 01/27/2015 12:54   Dg Chest Portable 1 View  01/27/2015   CLINICAL DATA:  Shortness of breath, chest pain  EXAM: PORTABLE CHEST 1 VIEW  COMPARISON:  01/22/2015  FINDINGS: Cardiomegaly.  No frank interstitial edema. No focal consolidation. No pleural effusion or pneumothorax.  IMPRESSION: Cardiomegaly.  No frank interstitial edema.   Electronically Signed   By: Julian Hy M.D.   On: 01/27/2015 11:02   Dg Chest Port 1 View  01/22/2015   CLINICAL DATA:  Pulmonary edema.  EXAM: PORTABLE  CHEST - 1 VIEW  COMPARISON:  01/19/2015.  CT 10/06/2014.  FINDINGS: Cardiomegaly with normal pulmonary vascularity. No interim change. Persistent right base pleural fluid collection. Low lung volumes with persistent bibasilar atelectasis. No pneumothorax. No acute osseous abnormality .  IMPRESSION: 1. Persistent right base pleural fluid collection without significant change. Persistent bibasilar subsegmental atelectasis.  2. Persistent state cardiomegaly.  No pulmonary venous congestion.   Electronically Signed   By: Marcello Moores  Register   On: 01/22/2015 07:33    Microbiology: Recent Results (from the past 240 hour(s))  MRSA PCR Screening     Status: None   Collection Time: 01/20/15  1:40 AM  Result Value Ref Range Status   MRSA by PCR NEGATIVE NEGATIVE Final    Comment:        The GeneXpert MRSA Assay (FDA approved for NASAL specimens only), is one component of a comprehensive MRSA colonization surveillance program. It is not intended to diagnose MRSA infection nor to guide or monitor treatment for MRSA infections.      Labs: Basic Metabolic Panel:  Recent Labs Lab 01/23/15 0326 01/24/15 0311 01/25/15 0251 01/27/15 1052 01/28/15 0250  NA 137 135 136 138 139  K 4.0 3.7 4.1 4.3 4.6  CL 93* 93* 95* 104 101  CO2 38* 36* 35* 27 32  GLUCOSE 230* 220* 168* 218* 216*  BUN 20 23* 24* 28* 30*  CREATININE 1.11* 0.99 1.03* 0.84 1.10*  CALCIUM 8.6* 8.7* 8.9 9.4 9.4  MG 1.8 1.8 1.8  --   --    Liver Function Tests: No results for input(s): AST, ALT, ALKPHOS, BILITOT, PROT, ALBUMIN in the last 168 hours. No results for input(s): LIPASE, AMYLASE in the last 168 hours. No results for input(s): AMMONIA in the last 168 hours. CBC:  Recent Labs Lab 01/27/15 1052  WBC 3.4*  NEUTROABS 1.9  HGB 13.3  HCT 41.9  MCV 94.6  PLT 114*   Cardiac Enzymes:  Recent Labs Lab 01/27/15 1108 01/27/15 1650 01/27/15 2209 01/28/15 0250  TROPONINI <0.03 <0.03 <0.03 <0.03   BNP: BNP (last 3  results)  Recent Labs  10/06/14 0340 01/19/15  2259 01/27/15 1052  BNP 129.6* 180.3* 118.1*    ProBNP (last 3 results) No results for input(s): PROBNP in the last 8760 hours.  CBG:  Recent Labs Lab 01/25/15 1256 01/27/15 1631 01/27/15 2054 01/28/15 0730 01/28/15 1140  GLUCAP 103* 215* 194* 190* 172*       Signed:  VANN, JESSICA  [facility] 01/28/2015, 12:59 PM

## 2015-01-28 NOTE — Progress Notes (Signed)
ANTICOAGULATION CONSULT NOTE - Follow Up Consult  Pharmacy Consult for apixaban Indication: atrial fibrillation and pulmonary embolus   Patient Measurements: Height: 5\' 3"  (160 cm) Weight: 270 lb (122.471 kg) IBW/kg (Calculated) : 52.4  Vital Signs: Temp: 97.9 F (36.6 C) (09/26 0449) BP: 129/69 mmHg (09/26 0449) Pulse Rate: 88 (09/26 0449)  Labs:  Recent Labs  01/27/15 1052  01/27/15 1650 01/27/15 2209 01/28/15 0250  HGB 13.3  --   --   --   --   HCT 41.9  --   --   --   --   PLT 114*  --   --   --   --   CREATININE 0.84  --   --   --  1.10*  TROPONINI  --   < > <0.03 <0.03 <0.03  < > = values in this interval not displayed.  Estimated Creatinine Clearance: 46.6 mL/min (by C-G formula based on Cr of 1.1).   Assessment: 81 y/o female on apixaban PTA for hx Afib admitted with SOB and found to have new PE on CTA chest. She took apixaban 2.5 mg bid for Afib PTA although she qualifies for higher dosing of 5 mg bid for age, weight, and SCr. Discussed with Dr. Eliseo Squires and agreed patient is not an apixaban failure because she was under treated for Afib. Will begin apixaban dosing for new PE. She has been on Lovenox inpatient with last dose at 05:08. No bleeding noted, Hb is stable, platelets low on admit and are stable.  Goal of Therapy:  full anticoagulation Monitor platelets by anticoagulation protocol: Yes   Plan:  - Discontinue Lovenox - Apixaban 10 mg PO bid for 15 doses then 5 mg PO bid - begin at 16:00 - Monitor for s/sx of bleeding  [facility], [place].D., BCPS Clinical Pharmacist Pager: [phone] 01/28/2015 9:40 AM

## 2015-03-17 NOTE — Progress Notes (Signed)
Patient admitted overnight from [name]- ok to return today. CSW notified Admissions at [facility]- bed is available.  Ok per patient and aughter Elizabeth Pugh. Nursing notifed to call report. No further CSW needs identified. CSW signing off.  Elizabeth Pugh. Elizabeth Pugh, Elizabeth Pugh

## 2015-08-29 ENCOUNTER — Emergency Department (HOSPITAL_COMMUNITY): Payer: Medicare Other

## 2015-08-29 ENCOUNTER — Inpatient Hospital Stay (HOSPITAL_COMMUNITY)
Admission: EM | Admit: 2015-08-29 | Discharge: 2015-09-02 | DRG: 202 | Disposition: A | Discharge status: Home Health | Payer: Medicare Other | Attending: Internal Medicine | Admitting: Internal Medicine

## 2015-08-29 ENCOUNTER — Encounter (HOSPITAL_COMMUNITY): Payer: Self-pay

## 2015-08-29 DIAGNOSIS — Z87891 Personal history of nicotine dependence: Secondary | ICD-10-CM

## 2015-08-29 DIAGNOSIS — Z794 Long term (current) use of insulin: Secondary | ICD-10-CM

## 2015-08-29 DIAGNOSIS — Z7901 Long term (current) use of anticoagulants: Secondary | ICD-10-CM

## 2015-08-29 DIAGNOSIS — J181 Lobar pneumonia, unspecified organism: Secondary | ICD-10-CM | POA: Diagnosis not present

## 2015-08-29 DIAGNOSIS — E875 Hyperkalemia: Secondary | ICD-10-CM | POA: Diagnosis not present

## 2015-08-29 DIAGNOSIS — F329 Major depressive disorder, single episode, unspecified: Secondary | ICD-10-CM | POA: Diagnosis present

## 2015-08-29 DIAGNOSIS — I110 Hypertensive heart disease with heart failure: Secondary | ICD-10-CM | POA: Diagnosis not present

## 2015-08-29 DIAGNOSIS — R0600 Dyspnea, unspecified: Secondary | ICD-10-CM

## 2015-08-29 DIAGNOSIS — Z86711 Personal history of pulmonary embolism: Secondary | ICD-10-CM | POA: Diagnosis not present

## 2015-08-29 DIAGNOSIS — Z7401 Bed confinement status: Secondary | ICD-10-CM | POA: Diagnosis not present

## 2015-08-29 DIAGNOSIS — N179 Acute kidney failure, unspecified: Secondary | ICD-10-CM | POA: Diagnosis present

## 2015-08-29 DIAGNOSIS — D696 Thrombocytopenia, unspecified: Secondary | ICD-10-CM | POA: Diagnosis present

## 2015-08-29 DIAGNOSIS — G40909 Epilepsy, unspecified, not intractable, without status epilepticus: Secondary | ICD-10-CM | POA: Diagnosis not present

## 2015-08-29 DIAGNOSIS — J209 Acute bronchitis, unspecified: Principal | ICD-10-CM | POA: Diagnosis present

## 2015-08-29 DIAGNOSIS — G2581 Restless legs syndrome: Secondary | ICD-10-CM | POA: Diagnosis not present

## 2015-08-29 DIAGNOSIS — J9621 Acute and chronic respiratory failure with hypoxia: Secondary | ICD-10-CM | POA: Diagnosis present

## 2015-08-29 DIAGNOSIS — Z66 Do not resuscitate: Secondary | ICD-10-CM | POA: Diagnosis not present

## 2015-08-29 DIAGNOSIS — IMO0002 Reserved for concepts with insufficient information to code with codable children: Secondary | ICD-10-CM | POA: Diagnosis present

## 2015-08-29 DIAGNOSIS — Z880 Allergy status to penicillin: Secondary | ICD-10-CM

## 2015-08-29 DIAGNOSIS — I69359 Hemiplegia and hemiparesis following cerebral infarction affecting unspecified side: Secondary | ICD-10-CM

## 2015-08-29 DIAGNOSIS — J9601 Acute respiratory failure with hypoxia: Secondary | ICD-10-CM | POA: Diagnosis not present

## 2015-08-29 DIAGNOSIS — F419 Anxiety disorder, unspecified: Secondary | ICD-10-CM | POA: Diagnosis present

## 2015-08-29 DIAGNOSIS — I5042 Chronic combined systolic (congestive) and diastolic (congestive) heart failure: Secondary | ICD-10-CM | POA: Diagnosis present

## 2015-08-29 DIAGNOSIS — R600 Localized edema: Secondary | ICD-10-CM | POA: Diagnosis present

## 2015-08-29 DIAGNOSIS — Z881 Allergy status to other antibiotic agents status: Secondary | ICD-10-CM

## 2015-08-29 DIAGNOSIS — I5032 Chronic diastolic (congestive) heart failure: Secondary | ICD-10-CM | POA: Diagnosis not present

## 2015-08-29 DIAGNOSIS — I482 Chronic atrial fibrillation, unspecified: Secondary | ICD-10-CM | POA: Diagnosis present

## 2015-08-29 DIAGNOSIS — E1165 Type 2 diabetes mellitus with hyperglycemia: Secondary | ICD-10-CM | POA: Diagnosis present

## 2015-08-29 DIAGNOSIS — I10 Essential (primary) hypertension: Secondary | ICD-10-CM | POA: Diagnosis present

## 2015-08-29 DIAGNOSIS — J9622 Acute and chronic respiratory failure with hypercapnia: Secondary | ICD-10-CM

## 2015-08-29 DIAGNOSIS — I4891 Unspecified atrial fibrillation: Secondary | ICD-10-CM | POA: Diagnosis present

## 2015-08-29 DIAGNOSIS — E860 Dehydration: Secondary | ICD-10-CM | POA: Diagnosis present

## 2015-08-29 DIAGNOSIS — E118 Type 2 diabetes mellitus with unspecified complications: Secondary | ICD-10-CM | POA: Diagnosis not present

## 2015-08-29 HISTORY — DX: Acute kidney failure, unspecified: N17.9

## 2015-08-29 HISTORY — DX: Lobar pneumonia, unspecified organism: J18.1

## 2015-08-29 LAB — URINALYSIS, ROUTINE W REFLEX MICROSCOPIC
BILIRUBIN URINE: NEGATIVE
GLUCOSE, UA: 100 mg/dL — AB
Hgb urine dipstick: NEGATIVE
KETONES UR: NEGATIVE mg/dL
LEUKOCYTES UA: NEGATIVE
NITRITE: NEGATIVE
PROTEIN: NEGATIVE mg/dL
Specific Gravity, Urine: 1.023 (ref 1.005–1.030)
pH: 6 (ref 5.0–8.0)

## 2015-08-29 LAB — BASIC METABOLIC PANEL
ANION GAP: 7 (ref 5–15)
BUN: 21 mg/dL — ABNORMAL HIGH (ref 6–20)
CALCIUM: 9 mg/dL (ref 8.9–10.3)
CHLORIDE: 102 mmol/L (ref 101–111)
CO2: 29 mmol/L (ref 22–32)
Creatinine, Ser: 0.99 mg/dL (ref 0.44–1.00)
GFR calc non Af Amer: 50 mL/min — ABNORMAL LOW (ref >60)
GFR, EST AFRICAN AMERICAN: 58 mL/min — AB (ref >60)
Glucose, Bld: 146 mg/dL — ABNORMAL HIGH (ref 65–99)
Potassium: 5.5 mmol/L — ABNORMAL HIGH (ref 3.5–5.1)
Sodium: 138 mmol/L (ref 135–145)

## 2015-08-29 LAB — GLUCOSE, CAPILLARY
GLUCOSE-CAPILLARY: 143 mg/dL — AB (ref 65–99)
Glucose-Capillary: 155 mg/dL — ABNORMAL HIGH (ref 65–99)

## 2015-08-29 LAB — COMPREHENSIVE METABOLIC PANEL
ALK PHOS: 58 U/L (ref 38–126)
ALT: 10 U/L — ABNORMAL LOW (ref 14–54)
ANION GAP: 8 (ref 5–15)
AST: 18 U/L (ref 15–41)
Albumin: 3.2 g/dL — ABNORMAL LOW (ref 3.5–5.0)
BILIRUBIN TOTAL: 0.8 mg/dL (ref 0.3–1.2)
BUN: 22 mg/dL — ABNORMAL HIGH (ref 6–20)
CALCIUM: 8.9 mg/dL (ref 8.9–10.3)
CO2: 29 mmol/L (ref 22–32)
CREATININE: 1.05 mg/dL — AB (ref 0.44–1.00)
Chloride: 101 mmol/L (ref 101–111)
GFR, EST AFRICAN AMERICAN: 54 mL/min — AB (ref >60)
GFR, EST NON AFRICAN AMERICAN: 46 mL/min — AB (ref >60)
Glucose, Bld: 233 mg/dL — ABNORMAL HIGH (ref 65–99)
Potassium: 5.8 mmol/L — ABNORMAL HIGH (ref 3.5–5.1)
Sodium: 138 mmol/L (ref 135–145)
TOTAL PROTEIN: 6.3 g/dL — AB (ref 6.5–8.1)

## 2015-08-29 LAB — I-STAT TROPONIN, ED: TROPONIN I, POC: 0 ng/mL (ref 0.00–0.08)

## 2015-08-29 LAB — CBC WITH DIFFERENTIAL/PLATELET
Basophils Absolute: 0 10*3/uL (ref 0.0–0.1)
Basophils Relative: 1 %
EOS ABS: 0 10*3/uL (ref 0.0–0.7)
EOS PCT: 2 %
HCT: 42.7 % (ref 36.0–46.0)
Hemoglobin: 13.4 g/dL (ref 12.0–15.0)
LYMPHS ABS: 1.1 10*3/uL (ref 0.7–4.0)
Lymphocytes Relative: 42 %
MCH: 29.3 pg (ref 26.0–34.0)
MCHC: 31.4 g/dL (ref 30.0–36.0)
MCV: 93.4 fL (ref 78.0–100.0)
MONO ABS: 0.2 10*3/uL (ref 0.1–1.0)
MONOS PCT: 7 %
Neutro Abs: 1.3 10*3/uL — ABNORMAL LOW (ref 1.7–7.7)
Neutrophils Relative %: 48 %
PLATELETS: 127 10*3/uL — AB (ref 150–400)
RBC: 4.57 MIL/uL (ref 3.87–5.11)
RDW: 14 % (ref 11.5–15.5)
WBC: 2.6 10*3/uL — AB (ref 4.0–10.5)

## 2015-08-29 LAB — INFLUENZA PANEL BY PCR (TYPE A & B)
H1N1 flu by pcr: NOT DETECTED
Influenza A By PCR: NEGATIVE
Influenza B By PCR: NEGATIVE

## 2015-08-29 LAB — CBG MONITORING, ED
GLUCOSE-CAPILLARY: 240 mg/dL — AB (ref 65–99)
GLUCOSE-CAPILLARY: 224 mg/dL — AB (ref 65–99)
GLUCOSE-CAPILLARY: 139 mg/dL — AB (ref 65–99)

## 2015-08-29 LAB — I-STAT CG4 LACTIC ACID, ED: Lactic Acid, Venous: 1.28 mmol/L (ref 0.5–2.0)

## 2015-08-29 LAB — BRAIN NATRIURETIC PEPTIDE: B Natriuretic Peptide: 102.7 pg/mL — ABNORMAL HIGH (ref 0.0–100.0)

## 2015-08-29 MED ORDER — LEVOFLOXACIN IN D5W 250 MG/50ML IV SOLN
250.0000 mg | INTRAVENOUS | Status: DC
  Filled 2015-08-29: qty 50

## 2015-08-29 MED ORDER — GLIMEPIRIDE 4 MG PO TABS
2.0000 mg | ORAL_TABLET | Freq: Every day | ORAL | Status: DC
  Administered 2015-08-30 – 2015-09-02 (×4): 2 mg via ORAL
  Filled 2015-08-29 (×4): qty 1

## 2015-08-29 MED ORDER — INSULIN ASPART 100 UNIT/ML ~~LOC~~ SOLN: 0.0000 [IU] | Freq: Every day | SUBCUTANEOUS | Status: DC

## 2015-08-29 MED ORDER — SODIUM CHLORIDE 0.9 % IV SOLN
INTRAVENOUS | Status: DC
  Administered 2015-08-29: 16:00:00 via INTRAVENOUS

## 2015-08-29 MED ORDER — INSULIN ASPART 100 UNIT/ML ~~LOC~~ SOLN
0.0000 [IU] | Freq: Three times a day (TID) | SUBCUTANEOUS | Status: DC
  Administered 2015-08-29 – 2015-08-30 (×2): 2 [IU] via SUBCUTANEOUS
  Administered 2015-08-30: 3 [IU] via SUBCUTANEOUS
  Administered 2015-08-30 – 2015-08-31 (×3): 2 [IU] via SUBCUTANEOUS
  Administered 2015-09-01: 5 [IU] via SUBCUTANEOUS
  Administered 2015-09-01: 3 [IU] via SUBCUTANEOUS
  Administered 2015-09-01: 2 [IU] via SUBCUTANEOUS
  Administered 2015-09-02: 3 [IU] via SUBCUTANEOUS
  Administered 2015-09-02: 2 [IU] via SUBCUTANEOUS

## 2015-08-29 MED ORDER — PREGABALIN 75 MG PO CAPS
75.0000 mg | ORAL_CAPSULE | Freq: Two times a day (BID) | ORAL | Status: DC
  Administered 2015-08-29 – 2015-09-02 (×8): 75 mg via ORAL
  Filled 2015-08-29 (×8): qty 1

## 2015-08-29 MED ORDER — DOXEPIN HCL 25 MG PO CAPS
25.0000 mg | ORAL_CAPSULE | Freq: Every day | ORAL | Status: DC
  Administered 2015-08-29 – 2015-09-01 (×4): 25 mg via ORAL
  Filled 2015-08-29 (×5): qty 1

## 2015-08-29 MED ORDER — LEVETIRACETAM 500 MG PO TABS
500.0000 mg | ORAL_TABLET | Freq: Two times a day (BID) | ORAL | Status: DC
Start: 2015-08-29 — End: 2015-09-02
  Administered 2015-08-29 – 2015-09-02 (×8): 500 mg via ORAL
  Filled 2015-08-29 (×8): qty 1

## 2015-08-29 MED ORDER — APIXABAN 5 MG PO TABS
5.0000 mg | ORAL_TABLET | Freq: Two times a day (BID) | ORAL | Status: DC
  Administered 2015-08-29 – 2015-09-02 (×8): 5 mg via ORAL
  Filled 2015-08-29 (×8): qty 1

## 2015-08-29 MED ORDER — ALPRAZOLAM 0.25 MG PO TABS: 0.2500 mg | ORAL_TABLET | Freq: Two times a day (BID) | ORAL | Status: DC | PRN

## 2015-08-29 MED ORDER — PANTOPRAZOLE SODIUM 40 MG PO TBEC: 40.0000 mg | DELAYED_RELEASE_TABLET | Freq: Every day | ORAL | Status: DC

## 2015-08-29 MED ORDER — DEXTROSE 50 % IV SOLN
1.0000 | Freq: Once | INTRAVENOUS | Status: DC
Start: 2015-08-29 — End: 2015-09-02
  Filled 2015-08-29: qty 50

## 2015-08-29 MED ORDER — PANTOPRAZOLE SODIUM 40 MG PO TBEC
40.0000 mg | DELAYED_RELEASE_TABLET | Freq: Every day | ORAL | Status: DC
  Administered 2015-08-30 – 2015-09-02 (×4): 40 mg via ORAL
  Filled 2015-08-29 (×4): qty 1

## 2015-08-29 MED ORDER — INSULIN ASPART 100 UNIT/ML IV SOLN
10.0000 [IU] | Freq: Once | INTRAVENOUS | Status: AC
  Administered 2015-08-29: 10 [IU] via INTRAVENOUS
  Filled 2015-08-29: qty 1

## 2015-08-29 MED ORDER — INSULIN GLARGINE 100 UNIT/ML ~~LOC~~ SOLN
20.0000 [IU] | Freq: Every day | SUBCUTANEOUS | Status: DC
  Administered 2015-08-29 – 2015-09-01 (×4): 20 [IU] via SUBCUTANEOUS
  Filled 2015-08-29 (×5): qty 0.2

## 2015-08-29 MED ORDER — ALBUTEROL SULFATE (2.5 MG/3ML) 0.083% IN NEBU
2.5000 mg | INHALATION_SOLUTION | Freq: Once | RESPIRATORY_TRACT | Status: AC
  Administered 2015-08-29: 2.5 mg via RESPIRATORY_TRACT
  Filled 2015-08-29: qty 3

## 2015-08-29 MED ORDER — LEVOFLOXACIN IN D5W 500 MG/100ML IV SOLN
500.0000 mg | INTRAVENOUS | Status: AC
  Administered 2015-08-29: 500 mg via INTRAVENOUS
  Filled 2015-08-29: qty 100

## 2015-08-29 MED ORDER — CETYLPYRIDINIUM CHLORIDE 0.05 % MT LIQD
7.0000 mL | Freq: Two times a day (BID) | OROMUCOSAL | Status: DC
  Administered 2015-08-29 – 2015-09-02 (×8): 7 mL via OROMUCOSAL

## 2015-08-29 MED ORDER — AMLODIPINE BESYLATE 5 MG PO TABS
5.0000 mg | ORAL_TABLET | Freq: Every day | ORAL | Status: DC
  Administered 2015-08-29 – 2015-09-01 (×4): 5 mg via ORAL
  Filled 2015-08-29 (×4): qty 1

## 2015-08-29 MED ORDER — ROPINIROLE HCL 1 MG PO TABS
2.0000 mg | ORAL_TABLET | Freq: Four times a day (QID) | ORAL | Status: DC
  Administered 2015-08-29 – 2015-09-02 (×16): 2 mg via ORAL
  Filled 2015-08-29 (×10): qty 2
  Filled 2015-08-29: qty 4
  Filled 2015-08-29 (×4): qty 2

## 2015-08-29 MED ORDER — IPRATROPIUM-ALBUTEROL 0.5-2.5 (3) MG/3ML IN SOLN: 3.0000 mL | RESPIRATORY_TRACT | Status: DC | PRN

## 2015-08-29 NOTE — ED Provider Notes (Signed)
CSN: [id number]     Arrival date & time 08/29/15  1131 History   First MD Initiated Contact with Patient 08/29/15 1138     Chief Complaint  Patient presents with  . Shortness of Breath  . Emesis  . Leg Swelling     (Consider location/radiation/quality/duration/timing/severity/associated sxs/prior Treatment) Patient is a 82 y.o. female presenting with shortness of breath and vomiting. The history is provided by the patient and medical records. No language interpreter was used.  Shortness of Breath Associated symptoms: vomiting   Associated symptoms: no chest pain, no cough, no fever, no headaches, no rash and no wheezing   Emesis Associated symptoms: no headaches and no myalgias    Elizabeth Pugh is a 82 y.o. female  with a PMH of afib on eliquis, prior stroke, HTN, DM, CHF who presents to the Emergency Department complaining of shortness of breath that began earlier today. Patient states the bilateral lower extremities are more swollen than usual. One episode of emesis prior to arrival. Patient denies chest pain, back pain, abdominal pain, fevers. Per nursing staff, the physical therapist came to her home for session today and noticed she was having trouble breathing, O2 of 89% by EMS. 96% on O2 this time. Rescue inhaler given this morning, no other treatments given prior to arrival. She currently lives at home with her daughter. Does not wear oxygen at home, but is bedridden. Not a smoker.  Past Medical History  Diagnosis Date  . Stroke ([place])   . GERD (gastroesophageal reflux disease)   . Hypertension   . Diabetes mellitus   . Blood transfusion   . Arthritis   . Anxiety   . Anemia   . GI (gastrointestinal bleed)     Due to benign mass  . Bronchitis   . Restless leg syndrome   . Atrial fibrillation ([place])   . Acute delirium 04/12/2011  . Seizures ([place])   . CHF (congestive heart failure) [facility])    Past Surgical History  Procedure Laterality Date  . Eye surgery      Cataract  removal  . Abdominal hysterectomy    . Tubal ligation    . Eus  09/24/2011    Procedure: UPPER ENDOSCOPIC ULTRASOUND (EUS) LINEAR;  Surgeon: Beryle Beams, MD;  Location: [facility] ENDOSCOPY;  Service: Endoscopy;  Laterality: N/A;   Family History  Problem Relation Age of Onset  . Aneurysm Mother   . Heart attack Father   . Heart failure Sister   . Asthma Sister   . Kidney failure Sister   . Stroke Sister    Social History  Substance Use Topics  . Smoking status: Former Smoker    Types: Cigarettes    Quit date: 09/21/1985  . Smokeless tobacco: Never Used  . Alcohol Use: No   OB History    No data available     Review of Systems  Constitutional: Negative for fever.  HENT: Negative for congestion.   Eyes: Negative for visual disturbance.  Respiratory: Positive for shortness of breath. Negative for cough and wheezing.   Cardiovascular: Positive for leg swelling. Negative for chest pain.  Gastrointestinal: Positive for vomiting.  Genitourinary: Negative for dysuria.  Musculoskeletal: Negative for myalgias.  Skin: Negative for rash.  Neurological: Negative for syncope and headaches.      Allergies  Erythromycin; Penicillins; and Zithromax  Home Medications   Prior to Admission medications   Medication Sig Start Date End Date Taking? Authorizing Provider  albuterol (PROVENTIL HFA;VENTOLIN HFA) 108 (  90 BASE) MCG/ACT inhaler Inhale 1 puff into the lungs every 6 (six) hours as needed for wheezing or shortness of breath.   Yes Historical Provider, MD  ALPRAZolam (XANAX) 0.25 MG tablet Take 1 tablet (0.25 mg total) by mouth 2 (two) times daily as needed for anxiety. 01/28/15  Yes Jessica U Vann, DO  amLODipine (NORVASC) 5 MG tablet Take 5 mg by mouth daily. 08/09/15  Yes Historical Provider, MD  apixaban (ELIQUIS) 5 MG TABS tablet 10 mg BID for 14 doses Patient taking differently: Take 5 mg by mouth 2 (two) times daily.  01/28/15  Yes Geradine Girt, DO  apixaban (ELIQUIS) 5 MG TABS  tablet Take 1 tablet (5 mg total) by mouth every 12 (twelve) hours. 02/05/15  Yes Geradine Girt, DO  calcium-vitamin D (CALCIUM 500+D) 500-400 MG-UNIT per tablet Take 1 tablet by mouth 2 (two) times daily.   Yes Historical Provider, MD  carvedilol (COREG CR) 20 MG 24 hr capsule Take 1 capsule (20 mg total) by mouth daily. Patient taking differently: Take 40 mg by mouth daily.  01/25/15  Yes [name], MD  doxepin (SINEQUAN) 25 MG capsule Take 25 mg by mouth at bedtime.   Yes Historical Provider, MD  esomeprazole (NEXIUM) 40 MG capsule Take 40 mg by mouth daily as needed (heart burn).    Yes Historical Provider, MD  furosemide (LASIX) 20 MG tablet Take 1 tablet (20 mg total) by mouth 2 (two) times daily. 01/25/15  Yes [name], MD  glimepiride (AMARYL) 2 MG tablet Take 2 mg by mouth daily before breakfast.   Yes Historical Provider, MD  hydrOXYzine (ATARAX/VISTARIL) 25 MG tablet Take 25 mg by mouth 2 (two) times daily as needed for itching.    Yes Historical Provider, MD  insulin aspart (NOVOLOG) 100 UNIT/ML injection Inject 0-12 Units into the skin 3 (three) times daily before meals. Under 150 = 0 units, 150 - 200 = 2 units, 201- 250 = 4 units, 251- 300 = 6 units, 301- 350 = 8 units, 351-400 = 10 units, 401-450 = 12  units   Yes Historical Provider, MD  insulin glargine (LANTUS) 100 UNIT/ML injection Inject 0.2 mLs (20 Units total) into the skin at bedtime. 01/28/15  Yes Geradine Girt, DO  levETIRAcetam (KEPPRA) 500 MG tablet Take 1 tablet (500 mg total) by mouth 2 (two) times daily. 02/27/12  Yes Monika Salk, MD  pantoprazole (PROTONIX) 40 MG tablet Take 1 tablet (40 mg total) by mouth daily at 6 (six) AM. 01/28/15  Yes Geradine Girt, DO  pregabalin (LYRICA) 75 MG capsule Take 75 mg by mouth 2 (two) times daily.   Yes Historical Provider, MD  rOPINIRole (REQUIP) 2 MG tablet Take 2 mg by mouth 4 (four) times daily.    Yes Historical Provider, MD  sulfamethoxazole-trimethoprim (BACTRIM  DS,SEPTRA DS) 800-160 MG tablet Take 1 tablet by mouth 2 (two) times daily. 08/20/15  Yes Historical Provider, MD  telmisartan (MICARDIS) 40 MG tablet Take 1 tablet (40 mg total) by mouth daily. 01/28/15  Yes Jessica U Vann, DO  VOLTAREN 1 % GEL Apply 1 application topically 4 (four) times daily as needed (knee pain). For pain 10/26/11  Yes Historical Provider, MD  ferrous sulfate 325 (65 FE) MG tablet Take 325 mg by mouth daily with breakfast.    Historical Provider, MD  ipratropium-albuterol (DUONEB) 0.5-2.5 (3) MG/3ML SOLN Take 3 mLs by nebulization every 6 (six) hours as needed. Patient not taking:  Reported on 08/29/2015 01/25/15   [name], MD   BP 90/55 mmHg  Pulse 78  Temp(Src) 99.1 F (37.3 C) (Rectal)  Resp 16  SpO2 93% Physical Exam  Constitutional: She is oriented to person, place, and time. She appears well-developed and well-nourished.  Alert and in no acute distress  HENT:  Head: Normocephalic and atraumatic.  Cardiovascular: Normal heart sounds and intact distal pulses.  Exam reveals no gallop and no friction rub.   No murmur heard. Irregularly irregular-history of A. fib on Eliquis  Pulmonary/Chest: She exhibits no tenderness.  02 of 94% on 2 L. Increased effort in breathing. Diminished breath sounds bilaterally, no wheezes or crackles appreciated.  Abdominal: Soft. Bowel sounds are normal. She exhibits no distension and no mass. There is no tenderness. There is no rebound and no guarding.  Musculoskeletal:  Mild bilateral lower extremity edema.   Neurological: She is alert and oriented to person, place, and time.  Skin: Skin is warm and dry.  Nursing note and vitals reviewed.   ED Course  Procedures (including critical care time) Labs Review Labs Reviewed  CBC WITH DIFFERENTIAL/PLATELET - Abnormal; Notable for the following:    WBC 2.6 (*)    Platelets 127 (*)    Neutro Abs 1.3 (*)    All other components within normal limits  BRAIN NATRIURETIC PEPTIDE -  Abnormal; Notable for the following:    B Natriuretic Peptide 102.7 (*)    All other components within normal limits  COMPREHENSIVE METABOLIC PANEL - Abnormal; Notable for the following:    Potassium 5.8 (*)    Glucose, Bld 233 (*)    BUN 22 (*)    Creatinine, Ser 1.05 (*)    Total Protein 6.3 (*)    Albumin 3.2 (*)    ALT 10 (*)    GFR calc non Af Amer 46 (*)    GFR calc Af Amer 54 (*)    All other components within normal limits  URINALYSIS, ROUTINE W REFLEX MICROSCOPIC (NOT AT [facility]) - Abnormal; Notable for the following:    Glucose, UA 100 (*)    All other components within normal limits  CBG MONITORING, ED - Abnormal; Notable for the following:    Glucose-Capillary 240 (*)    All other components within normal limits  CBG MONITORING, ED - Abnormal; Notable for the following:    Glucose-Capillary 224 (*)    All other components within normal limits  BASIC METABOLIC PANEL  INFLUENZA PANEL BY PCR (TYPE A & B, H1N1)  I-STAT TROPOININ, ED  I-STAT CG4 LACTIC ACID, ED    Imaging Review Dg Chest Port 1 View  08/29/2015  CLINICAL DATA:  Shortness of breath today History:  Diabetes Hypertension GERD EXAM: PORTABLE CHEST 1 VIEW COMPARISON:  01/27/2015 FINDINGS: Cardiac silhouette mildly enlarged. No mediastinal or hilar masses or evidence of adenopathy. There mild prominence of the bronchovascular markings bilaterally accentuated by the AP technique and patient's body habitus. No overt pulmonary edema. No evidence of pneumonia. No convincing pleural effusion and no pneumothorax. Bony thorax is demineralized. There are changes prior shoulder surgery the left, stable. IMPRESSION: No acute cardiopulmonary disease. Electronically Signed   By: Lajean Manes M.D.   On: 08/29/2015 12:33   I have personally reviewed and evaluated these images and lab results as part of my medical decision-making.   EKG Interpretation   Date/Time:  Thursday August 29 2015 11:42:07 EDT Ventricular Rate:  86 PR  Interval:    QRS  Duration: 117 QT Interval:  380 QTC Calculation: [id number] R Axis:   64 Text Interpretation:  Atrial fibrillation Paired ventricular premature  complexes Nonspecific intraventricular conduction delay Baseline wander in  lead(s) V3 V5 V6 Atrial fibrillation Artifact Abnormal ekg Confirmed by  Carmin Muskrat  MD ([id number]) on 08/29/2015 11:54:11 AM      MDM   Final diagnoses:  Dyspnea   Crissy P Butrick presents with dyspnea. Hx of CHF. Not on oxygen at home.  BNP 102.7  CMP with K+ of 5.8 - albuterol neb given, 10U insulin given.  UA unremarkable. All labs reviewed.  Imaging: CXR reassuring.  EKG reviewed - hx of afib on eliquis.   Consulted hospitalist, NP Lissa Merlin who will admit patient for further evaluation and management.   Patient seen by and discussed with Dr. Vanita Panda who agrees with treatment plan.   [facility] Juanantonio Stolar, PA-C 08/29/15 1518  Carmin Muskrat, MD 08/30/15 2149

## 2015-08-29 NOTE — ED Notes (Signed)
Per EMS - pt from home. Pt was supposed to have physical therapy this morning, physical therapist noted pt was short of breath. SpO2 approx 80%. EMS SpO2 was 89%. Placed on 4L [place]. Denies cp/n/v. Pt reports increased edema to bilateral lower extremities. Hx CHF and COPD.  Does not usually wear O2 at home. Pt bedridden at home.

## 2015-08-29 NOTE — H&P (Signed)
History and Physical    [name] [id number] DOB: [birth year] DOA: 08/29/2015  Referring MD/NP/PA: Vanita Panda / ER PCP: Philis Fendt, MD  Outpatient Specialists:  Collene Mares / Jonelle Sidle / Cardiology Patient coming from: Private residence  Chief Complaint:  Shortness of breath and cough  HPI: Elizabeth Pugh is a 82 y.o. female with medical history significant for diabetes on insulin, morbid obesity, chronic diastolic heart failure with mild concentric hypertrophy, chronic lower extremity edema left greater than right, bedbound 1 year secondary to history of CVA and severe osteoarthritis, chronic atrial fibrillation on anticoagulation, hypertension, seizure disorder, history of pulmonary embolism September 2016 and chronic thrombocytopenia. Patient reports that about 2 days ago she went her primary care doctor because of bronchitis symptoms and was started on Bactrim. She awakened today having much difficulty breathing. EMS was called to the home and it was reported that her room air saturations were 80% by the physical therapist who was attending to the patient for a session today. EMS pulse oximetry was 89%. She was placed on 4 L nasal cannula oxygen. Patient does not normally utilize oxygen at home. She reports she has a productive cough of clear sputum. She's not had any nausea vomiting or fevers she is aware of. She reports she is very thirsty.  ED Course:  Rectal temperature 99.1-BP 130/73-pulse 75 and regular-respirations 18-O2 saturations 96% on 2 L oxygen Portable chest x-ray: Read as no acute process but upon my review patient does have increased interstitial haziness in markings in the bilateral bases greater on the left concerning for pneumonitis Lab data: Sodium 138, potassium 5.8, BUN 22, creatinine 1.05, glucose 233, albumin 3.2, total protein 6.3, BNP 103, troponin 0.00, lactic acid 1.28, WBC 2600 with normal differential except for slightly decreased absolute neutrophils at  1.3%, hemoglobin 13.4, platelets 227,000; urinalysis unremarkable except for slight glycosuria glucose 100 Proventil neb 1 NovoLog insulin 10 units IV 1 D50 1 amp   Review of Systems:  In addition to the HPI above,  No Fever-chills, myalgias or other constitutional symptoms No Headache, changes with Vision or hearing, new weakness, tingling, numbness in any extremity, No problems swallowing food or Liquids, indigestion/reflux No Chest pain, palpitations, orthopnea No Abdominal pain, N/V; no melena or hematochezia, no dark tarry stools, Bowel movements are regular, No dysuria, hematuria or flank pain No new skin rashes, lesions, masses or bruises, No new joints pains-aches No recent weight gain or loss No polyuria, polydypsia or polyphagia, Patient is not ambulatory and does not weight bear or even pivot at side of bed   Past Medical History  Diagnosis Date  . Stroke ([place])   . GERD (gastroesophageal reflux disease)   . Hypertension   . Diabetes mellitus   . Blood transfusion   . Arthritis   . Anxiety   . Anemia   . GI (gastrointestinal bleed)     Due to benign mass  . Bronchitis   . Restless leg syndrome   . Atrial fibrillation ([employer])   . Acute delirium 04/12/2011  . Seizures ([place])   . CHF (congestive heart failure) [facility])     Past Surgical History  Procedure Laterality Date  . Eye surgery      Cataract removal  . Abdominal hysterectomy    . Tubal ligation    . Eus  09/24/2011    Procedure: UPPER ENDOSCOPIC ULTRASOUND (EUS) LINEAR;  Surgeon: Beryle Beams, MD;  Location: [facility] ENDOSCOPY;  Service: Endoscopy;  Laterality: N/A;  reports that she quit smoking about 29 years ago. Her smoking use included Cigarettes. She has never used smokeless tobacco. She reports that she does not drink alcohol or use illicit drugs.  Allergies  Allergen Reactions  . Erythromycin Other (See Comments)    Vaginal itching  . Penicillins Rash    Has patient had a PCN reaction causing  immediate rash, facial/tongue/throat swelling, SOB or lightheadedness with hypotension: No Has patient had a PCN reaction causing severe rash involving mucus membranes or skin necrosis: No Has patient had a PCN reaction that required hospitalization: No Has patient had a PCN reaction occurring within the last 10 years: No If all of the above answers are "NO", then may proceed with Cephalosporin use.  Marland Kitchen Zithromax [Azithromycin] Other (See Comments)    gave her a yeast infection.    Family History  Problem Relation Age of Onset  . Aneurysm Mother   . Heart attack Father   . Heart failure Sister   . Asthma Sister   . Kidney failure Sister   . Stroke Sister       Prior to Admission medications   Medication Sig Start Date End Date Taking? Authorizing Provider  albuterol (PROVENTIL HFA;VENTOLIN HFA) 108 (90 BASE) MCG/ACT inhaler Inhale 1 puff into the lungs every 6 (six) hours as needed for wheezing or shortness of breath.   Yes Historical Provider, MD  ALPRAZolam (XANAX) 0.25 MG tablet Take 1 tablet (0.25 mg total) by mouth 2 (two) times daily as needed for anxiety. 01/28/15  Yes Jessica U Vann, DO  amLODipine (NORVASC) 5 MG tablet Take 5 mg by mouth daily. 08/09/15  Yes Historical Provider, MD  apixaban (ELIQUIS) 5 MG TABS tablet 10 mg BID for 14 doses Patient taking differently: Take 5 mg by mouth 2 (two) times daily.  01/28/15  Yes Geradine Girt, DO  apixaban (ELIQUIS) 5 MG TABS tablet Take 1 tablet (5 mg total) by mouth every 12 (twelve) hours. 02/05/15  Yes Geradine Girt, DO  calcium-vitamin D (CALCIUM 500+D) 500-400 MG-UNIT per tablet Take 1 tablet by mouth 2 (two) times daily.   Yes Historical Provider, MD  carvedilol (COREG CR) 20 MG 24 hr capsule Take 1 capsule (20 mg total) by mouth daily. Patient taking differently: Take 40 mg by mouth daily.  01/25/15  Yes [name], MD  doxepin (SINEQUAN) 25 MG capsule Take 25 mg by mouth at bedtime.   Yes Historical Provider, MD    esomeprazole (NEXIUM) 40 MG capsule Take 40 mg by mouth daily as needed (heart burn).    Yes Historical Provider, MD  furosemide (LASIX) 20 MG tablet Take 1 tablet (20 mg total) by mouth 2 (two) times daily. 01/25/15  Yes [name], MD  glimepiride (AMARYL) 2 MG tablet Take 2 mg by mouth daily before breakfast.   Yes Historical Provider, MD  hydrOXYzine (ATARAX/VISTARIL) 25 MG tablet Take 25 mg by mouth 2 (two) times daily as needed for itching.    Yes Historical Provider, MD  insulin aspart (NOVOLOG) 100 UNIT/ML injection Inject 0-12 Units into the skin 3 (three) times daily before meals. Under 150 = 0 units, 150 - 200 = 2 units, 201- 250 = 4 units, 251- 300 = 6 units, 301- 350 = 8 units, 351-400 = 10 units, 401-450 = 12  units   Yes Historical Provider, MD  insulin glargine (LANTUS) 100 UNIT/ML injection Inject 0.2 mLs (20 Units total) into the skin at bedtime. 01/28/15  Yes  Geradine Girt, DO  levETIRAcetam (KEPPRA) 500 MG tablet Take 1 tablet (500 mg total) by mouth 2 (two) times daily. 02/27/12  Yes Monika Salk, MD  pantoprazole (PROTONIX) 40 MG tablet Take 1 tablet (40 mg total) by mouth daily at 6 (six) AM. 01/28/15  Yes Geradine Girt, DO  pregabalin (LYRICA) 75 MG capsule Take 75 mg by mouth 2 (two) times daily.   Yes Historical Provider, MD  rOPINIRole (REQUIP) 2 MG tablet Take 2 mg by mouth 4 (four) times daily.    Yes Historical Provider, MD  sulfamethoxazole-trimethoprim (BACTRIM DS,SEPTRA DS) 800-160 MG tablet Take 1 tablet by mouth 2 (two) times daily. 08/20/15  Yes Historical Provider, MD  telmisartan (MICARDIS) 40 MG tablet Take 1 tablet (40 mg total) by mouth daily. 01/28/15  Yes Jessica U Vann, DO  VOLTAREN 1 % GEL Apply 1 application topically 4 (four) times daily as needed (knee pain). For pain 10/26/11  Yes Historical Provider, MD  ferrous sulfate 325 (65 FE) MG tablet Take 325 mg by mouth daily with breakfast.    Historical Provider, MD  ipratropium-albuterol (DUONEB) 0.5-2.5  (3) MG/3ML SOLN Take 3 mLs by nebulization every 6 (six) hours as needed. Patient not taking: Reported on 08/29/2015 01/25/15   [name], MD    Physical Exam: Filed Vitals:   08/29/15 1400 08/29/15 1415 08/29/15 1430 08/29/15 1445  BP: 107/63 101/71 95/71 90/55   Pulse: 86  85 78  Temp:      TempSrc:      Resp: 21 15 16 16   SpO2: 100%  97% 93%      Constitutional: NAD, calm, comfortable Filed Vitals:   08/29/15 1400 08/29/15 1415 08/29/15 1430 08/29/15 1445  BP: 107/63 101/71 95/71 90/55   Pulse: 86  85 78  Temp:      TempSrc:      Resp: 21 15 16 16   SpO2: 100%  97% 93%   Eyes: PERRL, lids and conjunctivae normal ENMT: Mucous membranes are Dry. Posterior pharynx clear of any exudate or lesions.Normal dentition.  Neck: normal, supple, no masses, no thyromegaly Respiratory: clear to auscultation anteriorly and somewhat diminished but with basilar crackles posteriorly right greater than left, no wheezing, no crackles. Normal respiratory effort. No accessory muscle use. 2 L oxygen Cardiovascular: Regular rate and rhythm, no murmurs / rubs / gallops. Bilateral lower extremity edema 1-2+ left slightly greater than right. 2+ pedal pulses. No carotid bruits.  Abdomen: no tenderness, no masses palpated. No hepatosplenomegaly. Bowel sounds positive.  Musculoskeletal: no clubbing / cyanosis. No joint deformity upper and lower extremities. Good ROM, no contractures. Normal muscle tone.  Skin: no rashes, lesions, ulcers. No induration Neurologic: CN 2-12 grossly intact. Sensation intact, DTR normal. Strength 5/5 in all 4.  Psychiatric: Normal judgment and insight. Alert and oriented x 3. Normal mood.    Labs on Admission: I have personally reviewed following labs and imaging studies  CBC:  Recent Labs Lab 08/29/15 1147  WBC 2.6*  NEUTROABS 1.3*  HGB 13.4  HCT 42.7  MCV 93.4  PLT [id number]*   Basic Metabolic Panel:  Recent Labs Lab 08/29/15 1147  NA 138  K 5.8*  CL 101  CO2  29  GLUCOSE 233*  BUN 22*  CREATININE 1.05*  CALCIUM 8.9   GFR: CrCl cannot be calculated (Unknown ideal weight.). Liver Function Tests:  Recent Labs Lab 08/29/15 1147  AST 18  ALT 10*  ALKPHOS 58  BILITOT 0.8  PROT 6.3*  ALBUMIN  3.2*   No results for input(s): LIPASE, AMYLASE in the last 168 hours. No results for input(s): AMMONIA in the last 168 hours. Coagulation Profile: No results for input(s): INR, PROTIME in the last 168 hours. Cardiac Enzymes: No results for input(s): CKTOTAL, CKMB, CKMBINDEX, TROPONINI in the last 168 hours. BNP (last 3 results) No results for input(s): PROBNP in the last 8760 hours. HbA1C: No results for input(s): HGBA1C in the last 72 hours. CBG:  Recent Labs Lab 08/29/15 1312 08/29/15 1419 08/29/15 1515  GLUCAP 240* 224* 139*   Lipid Profile: No results for input(s): CHOL, HDL, LDLCALC, TRIG, CHOLHDL, LDLDIRECT in the last 72 hours. Thyroid Function Tests: No results for input(s): TSH, T4TOTAL, FREET4, T3FREE, THYROIDAB in the last 72 hours. Anemia Panel: No results for input(s): VITAMINB12, FOLATE, FERRITIN, TIBC, IRON, RETICCTPCT in the last 72 hours. Urine analysis:    Component Value Date/Time   COLORURINE YELLOW 08/29/2015 [place] 08/29/2015 1156   LABSPEC 1.023 08/29/2015 1156   PHURINE 6.0 08/29/2015 1156   GLUCOSEU 100* 08/29/2015 1156   HGBUR NEGATIVE 08/29/2015 1156   BILIRUBINUR NEGATIVE 08/29/2015 1156   KETONESUR NEGATIVE 08/29/2015 1156   PROTEINUR NEGATIVE 08/29/2015 1156   UROBILINOGEN 1.0 01/19/2015 2241   NITRITE NEGATIVE 08/29/2015 1156   LEUKOCYTESUR NEGATIVE 08/29/2015 1156   Sepsis Labs: @LABRCNTIP (procalcitonin:4,lacticidven:4) )No results found for this or any previous visit (from the past 240 hour(s)).   Radiological Exams on Admission: Dg Chest Port 1 View  08/29/2015  CLINICAL DATA:  Shortness of breath today History:  Diabetes Hypertension GERD EXAM: PORTABLE CHEST 1 VIEW  COMPARISON:  01/27/2015 FINDINGS: Cardiac silhouette mildly enlarged. No mediastinal or hilar masses or evidence of adenopathy. There mild prominence of the bronchovascular markings bilaterally accentuated by the AP technique and patient's body habitus. No overt pulmonary edema. No evidence of pneumonia. No convincing pleural effusion and no pneumothorax. Bony thorax is demineralized. There are changes prior shoulder surgery the left, stable. IMPRESSION: No acute cardiopulmonary disease. Electronically Signed   By: Lajean Manes M.D.   On: 08/29/2015 12:33    EKG: (Independently reviewed) atrial fibrillation with ventricular rate 86 bpm, QTC 454 ms, no ischemic changes  Assessment/Plan Principal Problem:   Acute bronchitis/ Acute respiratory failure with hypoxia -Has been on Bactrim for 24 hours with worsening of symptoms and now with new acute hypoxemia but no focal infiltrates on chest x-ray -Continue supportive care with oxygen and prn nebs as well as TCDB -Levaquin and discontinue preadmission Bactrim -Blood cultures and sputum culture, influenza PCR and urinary strep  Active Problems:   DM (diabetes mellitus), type 2, uncontrolled with complications  -Uncontrolled with initial readings greater than 200 which is not typical for patient and concerning for underlying infectious process -Continue Levemir and Amaryl -Hemoglobin A1c -SSI    Chronic diastolic heart failure  -Appears compensated and given acute bronchitis w/ concerns for dehydration (see below) will hold ARB and furosemide -Last echo September 2016 with preserved LV function and elevated ventricular filling pressure in setting of mild concentric hypertrophy concerning for low-grade diastolic dysfunction     Acute hyperkalemia -Suspect related to dehydration in setting of acute upper rest reinfection and hyperglycemia -Was given insulin and D50 by EDP -Repeat electrolytes to ensure not a hemolyzed specimen; if potassium  remains elevated will need calcium gluconate as well -Suspect dehydration contributing so we'll begin IV fluids at 75 mL -Hold ARB and diuretics    Hypertension -Look pressure actually suboptimal and currently  holding ARB as above -Holding long-acting carvedilol as well but will give low-dose Norvasc for now    Chronic atrial fibrillation  -Currently rate controlled -Carvedilol on hold -Continue eliquis  -CHADVASc = 8    Seizure disorder  -Continue Keppra -Seizure precautions especially during acute infection    History of pulmonary embolism -Has chronic lower extremity edema and although is on eliquis as precaution we'll check lower stringy venous duplex -Eliquis as above; had been on eliquis prior to PE but a low dose of 2.5 mg twice a day and given patient's BMI she was increased to 5 mg twice a day    CVA, old, hemiparesis  -Because of this and obesity in setting of osteoarthritis of knees patient is now nonambulatory in bed bound and requires a [place] lift for mobilization    Restless leg syndrome -Continue Lyrica and Requip    Thrombocytopenia  -She has chronic stable thrombocytopenia with transient recurrent which is mild -Continue to follow -Platelets are at baseline and greater -Wbc's 2600 w/ an essentially normal differential      DVT prophylaxis: Eliquis Code Status: DO NOT RESUSCITATE Family Communication: Daughter Elizabeth Pugh) and sister Elizabeth Pugh) at bedside Disposition Plan: Plan is to return to home environment when medically stable Consults called: None Admission status: Telemetry/observation-can change to MedSurg if potassium is normal on second blood collection Mobility: Bedbound and utilizes [place] lift to get in wheelchair when at home   ELLIS,ALLISON L. ANP-BC [facility] Pager [phone]   If 7PM-7AM, please contact night-coverage [email or web address] Password [facility]  08/29/2015, 3:34 PM

## 2015-08-29 NOTE — ED Notes (Signed)
Checked patient blood sugar it was 139 notified of blood sugar

## 2015-08-30 ENCOUNTER — Observation Stay (HOSPITAL_BASED_OUTPATIENT_CLINIC_OR_DEPARTMENT_OTHER): Payer: Medicare Other

## 2015-08-30 DIAGNOSIS — R600 Localized edema: Secondary | ICD-10-CM

## 2015-08-30 DIAGNOSIS — E875 Hyperkalemia: Secondary | ICD-10-CM | POA: Diagnosis not present

## 2015-08-30 DIAGNOSIS — R0600 Dyspnea, unspecified: Secondary | ICD-10-CM | POA: Diagnosis not present

## 2015-08-30 DIAGNOSIS — I482 Chronic atrial fibrillation: Secondary | ICD-10-CM | POA: Diagnosis not present

## 2015-08-30 DIAGNOSIS — J9601 Acute respiratory failure with hypoxia: Secondary | ICD-10-CM | POA: Diagnosis not present

## 2015-08-30 DIAGNOSIS — J209 Acute bronchitis, unspecified: Secondary | ICD-10-CM | POA: Diagnosis not present

## 2015-08-30 LAB — HEMOGLOBIN A1C
HEMOGLOBIN A1C: 9.6 % — AB (ref 4.8–5.6)
MEAN PLASMA GLUCOSE: 229 mg/dL

## 2015-08-30 LAB — GLUCOSE, CAPILLARY
GLUCOSE-CAPILLARY: 170 mg/dL — AB (ref 65–99)
Glucose-Capillary: 130 mg/dL — ABNORMAL HIGH (ref 65–99)
Glucose-Capillary: 148 mg/dL — ABNORMAL HIGH (ref 65–99)
Glucose-Capillary: 121 mg/dL — ABNORMAL HIGH (ref 65–99)
Glucose-Capillary: 89 mg/dL (ref 65–99)

## 2015-08-30 LAB — HIV ANTIBODY (ROUTINE TESTING W REFLEX): HIV SCREEN 4TH GENERATION: NONREACTIVE

## 2015-08-30 MED ORDER — LEVOFLOXACIN 500 MG PO TABS
250.0000 mg | ORAL_TABLET | Freq: Every day | ORAL | Status: DC
  Administered 2015-08-30 – 2015-09-02 (×4): 250 mg via ORAL
  Filled 2015-08-30 (×4): qty 1

## 2015-08-30 MED ORDER — T.E.D. BELOW KNEE/MEDIUM MISC: Status: DC

## 2015-08-30 NOTE — Discharge Instructions (Signed)
If feet are swollen, they should be elevated at the level of the stomach or higher If feet are hanging down, must wear TEDS at all times.     Information on my medicine - ELIQUIS (apixaban)  This medication education was reviewed with me or my healthcare representative as part of my discharge preparation.   Why was Eliquis prescribed for you? Eliquis was prescribed for you to reduce the risk of a blood clot forming that can cause a stroke if you have a medical condition called atrial fibrillation (a type of irregular heartbeat).  What do You need to know about Eliquis ? Take your Eliquis TWICE DAILY - one tablet in the morning and one tablet in the evening with or without food. If you have difficulty swallowing the tablet whole please discuss with your pharmacist how to take the medication safely.  Take Eliquis exactly as prescribed by your doctor and DO NOT stop taking Eliquis without talking to the doctor who prescribed the medication.  Stopping may increase your risk of developing a stroke.  Refill your prescription before you run out.  After discharge, you should have regular check-up appointments with your healthcare provider that is prescribing your Eliquis.  In the future your dose may need to be changed if your kidney function or weight changes by a significant amount or as you get older.  What do you do if you miss a dose? If you miss a dose, take it as soon as you remember on the same day and resume taking twice daily.  Do not take more than one dose of ELIQUIS at the same time to make up a missed dose.  Important Safety Information A possible side effect of Eliquis is bleeding. You should call your healthcare provider right away if you experience any of the following: ? Bleeding from an injury or your nose that does not stop. ? Unusual colored urine (red or dark brown) or unusual colored stools (red or black). ? Unusual bruising for unknown reasons. ? A serious fall or  if you hit your head (even if there is no bleeding).  Some medicines may interact with Eliquis and might increase your risk of bleeding or clotting while on Eliquis. To help avoid this, consult your healthcare provider or pharmacist prior to using any new prescription or non-prescription medications, including herbals, vitamins, non-steroidal anti-inflammatory drugs (NSAIDs) and supplements.  This website has more information on Eliquis (apixaban): http://www.eliquis.com/eliquis/home

## 2015-08-30 NOTE — Care Management Obs Status (Signed)
[facility] NOTIFICATION   Patient Details  Name: MALVINE ACKLAND MRN: [id number] Date of Birth: 20-Nov-1926   Medicare Observation Status Notification Given:  Yes    Nila Nephew, RN 08/30/2015, 11:30 AM

## 2015-08-30 NOTE — Progress Notes (Signed)
*  Preliminary Results* Bilateral lower extremity venous duplex completed. Study was very technically difficult due to extensive edema and patient body habitus. Unable to visualize most veins of bilateral lower extremities due to technical limitations; therefore this exam is essentially inconclusive.  08/30/2015  Maudry Mayhew, RVT, RDCS, RDMS

## 2015-08-30 NOTE — Progress Notes (Addendum)
PROGRESS NOTE    Elizabeth Pugh  [id number] DOB: Sep 30, 1926 DOA: 08/29/2015  PCP: Philis Fendt, MD  Outpatient Specialists: GI- Dr hung    Brief Narrative:  Elizabeth Pugh is a 82 y.o. female with medical history significant for diabetes on insulin, morbid obesity, chronic diastolic heart failure with mild concentric hypertrophy, chronic lower extremity edema, bedbound 1 year secondary to history of CVA and severe osteoarthritis, chronic atrial fibrillation on anticoagulation, hypertension, seizure disorder, history of pulmonary embolism September 2016 and chronic thrombocytopenia. Patient reports that about 2 days ago she went her primary care doctor because of bronchitis symptoms and was started on Bactrim. She was found to be dyspneic with a pulse ox of 80% on room air by her physical therapist. CXR clear. Admitted for acute bronchitis.  Assessment & Plan:   Principal Problem:   Acute bronchitis- acute hypoxic resp failure - Holding Bactrim- cont Levaquin, Nebs, O2- pulse ox checked this AM on my request >> 84% on room air- not on O2 at home with normal pulse ox when seen at her PCP's a few days ago - states her Neb machine at home is not working- will place new order - change Levaquin to oral  Active Problems: AKI/ hyperkalemia - dehydrated - hold lasix - has diastolic dysfunction- on NS at 75 cc/hr- will stop this now - holding Telmisartan  Pedal edema - venous stasis - states she sleep with a pillow under her knees so feet are always in dependent position - have prescribed TEDS and have asked RN to keep script in chart for discharge    DM (diabetes mellitus), type 2, uncontrolled with complications -- Lantus, Amaryl, SSI    CVA, old, hemiparesis - non- ambulatory for years    Hypertension - Norvasc, coreg - Micardis and Lasix on hold    Restless leg syndrome Requip    Chronic atrial fibrillation (HCC) - eliquis, Coreg    Seizure disorder - cont  antiseizure meds    History of pulmonary embolism - Eliquis    Chronic diastolic heart failure - Lasix and ARB on hold for now    Thrombocytopenia  - chronic    DVT prophylaxis: Eliquis Code Status: DNR Family Communication:  Disposition Plan: home when hypoxia improves   Consultants:   none  Procedures:   non3  Antimicrobials:  Anti-infectives    Start     Dose/Rate Route Frequency Ordered Stop   08/30/15 1600  Levofloxacin (LEVAQUIN) IVPB 250 mg     250 mg 50 mL/hr over 60 Minutes Intravenous Every 24 hours 08/29/15 1536     08/29/15 1600  levofloxacin (LEVAQUIN) IVPB 500 mg     500 mg 100 mL/hr over 60 Minutes Intravenous Every 24 hours 08/29/15 1523 08/29/15 1733      Subjective: Breathing OK at this time. Cough with clear sputum. No chest pain. No vomiting, diarrhea. Legs are swollen. Discussed need for elevated which she is not doing at home.   Objective: Filed Vitals:   08/29/15 1615 08/29/15 1725 08/29/15 2106 08/30/15 1007  BP: 121/66 126/68 117/63 117/63  Pulse: 83 73 77   Temp:  98.5 F (36.9 C) 97.3 F (36.3 C)   TempSrc:   Oral   Resp: 16 16 16    SpO2: 93% 99% 98%     Intake/Output Summary (Last 24 hours) at 08/30/15 1109 Last data filed at 08/29/15 1800  Gross per 24 hour  Intake    240 ml  Output  0 ml  Net    240 ml   There were no vitals filed for this visit.  Examination: General exam: Appears comfortable  HEENT: PERRLA, oral mucosa moist, no sclera icterus or thrush Respiratory system: coarse breath sounds- pulse ox 84% on room air-  Respiratory effort normal. Cardiovascular system: S1 & S2 heard, RRR.  No murmurs  Gastrointestinal system: Abdomen soft, non-tender, nondistended. Normal bowel sound. No organomegaly Central nervous system: Alert and oriented. No focal neurological deficits. Extremities: No cyanosis, clubbing- pedal edema L >> R Skin: No rashes or ulcers Psychiatry:  Mood & affect appropriate.     Data  Reviewed: I have personally reviewed following labs and imaging studies  CBC:  Recent Labs Lab 08/29/15 1147  WBC 2.6*  NEUTROABS 1.3*  HGB 13.4  HCT 42.7  MCV 93.4  PLT [id number]*   Basic Metabolic Panel:  Recent Labs Lab 08/29/15 1147 08/29/15 1957  NA 138 138  K 5.8* 5.5*  CL 101 102  CO2 29 29  GLUCOSE 233* 146*  BUN 22* 21*  CREATININE 1.05* 0.99  CALCIUM 8.9 9.0   GFR: CrCl cannot be calculated (Unknown ideal weight.). Liver Function Tests:  Recent Labs Lab 08/29/15 1147  AST 18  ALT 10*  ALKPHOS 58  BILITOT 0.8  PROT 6.3*  ALBUMIN 3.2*   No results for input(s): LIPASE, AMYLASE in the last 168 hours. No results for input(s): AMMONIA in the last 168 hours. Coagulation Profile: No results for input(s): INR, PROTIME in the last 168 hours. Cardiac Enzymes: No results for input(s): CKTOTAL, CKMB, CKMBINDEX, TROPONINI in the last 168 hours. BNP (last 3 results) No results for input(s): PROBNP in the last 8760 hours. HbA1C:  Recent Labs  08/29/15 1615  HGBA1C 9.6*   CBG:  Recent Labs Lab 08/29/15 1515 08/29/15 1801 08/29/15 2121 08/30/15 0619 08/30/15 0954  GLUCAP 139* 143* 155* 130* 170*   Lipid Profile: No results for input(s): CHOL, HDL, LDLCALC, TRIG, CHOLHDL, LDLDIRECT in the last 72 hours. Thyroid Function Tests: No results for input(s): TSH, T4TOTAL, FREET4, T3FREE, THYROIDAB in the last 72 hours. Anemia Panel: No results for input(s): VITAMINB12, FOLATE, FERRITIN, TIBC, IRON, RETICCTPCT in the last 72 hours. Urine analysis:    Component Value Date/Time   COLORURINE YELLOW 08/29/2015 Elizabeth Pugh 08/29/2015 1156   LABSPEC 1.023 08/29/2015 1156   PHURINE 6.0 08/29/2015 1156   GLUCOSEU 100* 08/29/2015 1156   HGBUR NEGATIVE 08/29/2015 1156   BILIRUBINUR NEGATIVE 08/29/2015 1156   KETONESUR NEGATIVE 08/29/2015 1156   PROTEINUR NEGATIVE 08/29/2015 1156   UROBILINOGEN 1.0 01/19/2015 2241   NITRITE NEGATIVE 08/29/2015 1156    LEUKOCYTESUR NEGATIVE 08/29/2015 1156   Sepsis Labs: @LABRCNTIP (procalcitonin:4,lacticidven:4)  )No results found for this or any previous visit (from the past 240 hour(s)).       Radiology Studies: Dg Chest Port 1 View  08/29/2015  CLINICAL DATA:  Shortness of breath today History:  Diabetes Hypertension GERD EXAM: PORTABLE CHEST 1 VIEW COMPARISON:  01/27/2015 FINDINGS: Cardiac silhouette mildly enlarged. No mediastinal or hilar masses or evidence of adenopathy. There mild prominence of the bronchovascular markings bilaterally accentuated by the AP technique and patient's body habitus. No overt pulmonary edema. No evidence of pneumonia. No convincing pleural effusion and no pneumothorax. Bony thorax is demineralized. There are changes prior shoulder surgery the left, stable. IMPRESSION: No acute cardiopulmonary disease. Electronically Signed   By: Lajean Manes M.D.   On: 08/29/2015 12:33  Scheduled Meds: . amLODipine  5 mg Oral Daily  . antiseptic oral rinse  7 mL Mouth Rinse BID  . apixaban  5 mg Oral Q12H  . dextrose  1 ampule Intravenous Once  . doxepin  25 mg Oral QHS  . glimepiride  2 mg Oral QAC breakfast  . insulin aspart  0-15 Units Subcutaneous TID WC  . insulin aspart  0-5 Units Subcutaneous QHS  . insulin glargine  20 Units Subcutaneous QHS  . levETIRAcetam  500 mg Oral BID  . levofloxacin (LEVAQUIN) IV  250 mg Intravenous Q24H  . pantoprazole  40 mg Oral Q0600  . pregabalin  75 mg Oral BID  . rOPINIRole  2 mg Oral QID   Continuous Infusions: . sodium chloride 75 mL/hr at 08/29/15 1545     LOS: 1 day    Time spent in minutes: 24    [place], MD [facility] Pager: [email or web address] Password [facility] 08/30/2015, 11:09 AM

## 2015-08-30 NOTE — Care Management Note (Signed)
Case Management Note  Patient Details  Name: Elizabeth Pugh MRN: [id number] Date of Birth: [birth year]  Subjective/Objective:           Admitted with acute bronchitis         Action/Plan: Spoke with patient and her daughter about discharge plan, she lives with her daughter who is her main caregiver. Patient stated that she is bed bound, has a hoyer left, hospital bed, and wheelchair. She is active with [facility] for HHPT. Contacted [facility] with [facility], confirmed that patient is active with HHPT, obtained order to resume HHPT, [facility] will resume HHPT. Contacted James with [facility] and requested nebulizer be delivered to patient's room.    Expected Discharge Date:                  Expected Discharge Plan:  [place]  In-House Referral:  NA  Discharge planning Services  CM Consult  Post Acute Care Choice:  Resumption of Svcs/PTA Provider Choice offered to:     DME Arranged:  Nebulizer/meds DME Agency:  [place]:  PT [facility] Agency:  [place]  Status of Service:  In process, will continue to follow  Medicare Important Message Given:    Date Medicare IM Given:    Medicare IM give by:    Date Additional Medicare IM Given:    Additional Medicare Important Message give by:     If discussed at [place] of Stay Meetings, dates discussed:    Additional Comments:  Nila Nephew, RN 08/30/2015, 2:02 PM

## 2015-08-31 DIAGNOSIS — E875 Hyperkalemia: Secondary | ICD-10-CM | POA: Diagnosis not present

## 2015-08-31 DIAGNOSIS — D696 Thrombocytopenia, unspecified: Secondary | ICD-10-CM

## 2015-08-31 DIAGNOSIS — Z86711 Personal history of pulmonary embolism: Secondary | ICD-10-CM

## 2015-08-31 DIAGNOSIS — I482 Chronic atrial fibrillation: Secondary | ICD-10-CM

## 2015-08-31 DIAGNOSIS — G40909 Epilepsy, unspecified, not intractable, without status epilepticus: Secondary | ICD-10-CM

## 2015-08-31 DIAGNOSIS — G2581 Restless legs syndrome: Secondary | ICD-10-CM

## 2015-08-31 DIAGNOSIS — J209 Acute bronchitis, unspecified: Secondary | ICD-10-CM | POA: Diagnosis not present

## 2015-08-31 DIAGNOSIS — R600 Localized edema: Secondary | ICD-10-CM | POA: Diagnosis present

## 2015-08-31 DIAGNOSIS — J9601 Acute respiratory failure with hypoxia: Secondary | ICD-10-CM | POA: Diagnosis not present

## 2015-08-31 DIAGNOSIS — R0600 Dyspnea, unspecified: Secondary | ICD-10-CM | POA: Diagnosis not present

## 2015-08-31 DIAGNOSIS — I5032 Chronic diastolic (congestive) heart failure: Secondary | ICD-10-CM

## 2015-08-31 LAB — BASIC METABOLIC PANEL
ANION GAP: 7 (ref 5–15)
BUN: 20 mg/dL (ref 6–20)
CALCIUM: 8.7 mg/dL — AB (ref 8.9–10.3)
CO2: 28 mmol/L (ref 22–32)
Chloride: 103 mmol/L (ref 101–111)
Creatinine, Ser: 0.99 mg/dL (ref 0.44–1.00)
GFR, EST AFRICAN AMERICAN: 58 mL/min — AB (ref >60)
GFR, EST NON AFRICAN AMERICAN: 50 mL/min — AB (ref >60)
Glucose, Bld: 123 mg/dL — ABNORMAL HIGH (ref 65–99)
Potassium: 5.4 mmol/L — ABNORMAL HIGH (ref 3.5–5.1)
SODIUM: 138 mmol/L (ref 135–145)

## 2015-08-31 LAB — GLUCOSE, CAPILLARY
GLUCOSE-CAPILLARY: 144 mg/dL — AB (ref 65–99)
GLUCOSE-CAPILLARY: 109 mg/dL — AB (ref 65–99)
Glucose-Capillary: 150 mg/dL — ABNORMAL HIGH (ref 65–99)
Glucose-Capillary: 136 mg/dL — ABNORMAL HIGH (ref 65–99)

## 2015-08-31 MED ORDER — CARVEDILOL PHOSPHATE ER 20 MG PO CP24
20.0000 mg | ORAL_CAPSULE | Freq: Every day | ORAL | Status: DC
  Administered 2015-09-01 – 2015-09-02 (×2): 20 mg via ORAL
  Filled 2015-08-31 (×4): qty 1

## 2015-08-31 MED ORDER — FUROSEMIDE 20 MG PO TABS
20.0000 mg | ORAL_TABLET | Freq: Two times a day (BID) | ORAL | Status: DC
  Administered 2015-08-31 – 2015-09-02 (×4): 20 mg via ORAL
  Filled 2015-08-31 (×4): qty 1

## 2015-08-31 MED ORDER — GUAIFENESIN ER 600 MG PO TB12
1200.0000 mg | ORAL_TABLET | Freq: Two times a day (BID) | ORAL | Status: DC
  Administered 2015-08-31 – 2015-09-02 (×5): 1200 mg via ORAL
  Filled 2015-08-31 (×5): qty 2

## 2015-08-31 MED ORDER — IPRATROPIUM-ALBUTEROL 0.5-2.5 (3) MG/3ML IN SOLN
3.0000 mL | Freq: Four times a day (QID) | RESPIRATORY_TRACT | Status: DC
  Administered 2015-08-31 (×2): 3 mL via RESPIRATORY_TRACT
  Filled 2015-08-31 (×2): qty 3

## 2015-08-31 MED ORDER — IPRATROPIUM-ALBUTEROL 0.5-2.5 (3) MG/3ML IN SOLN
3.0000 mL | Freq: Three times a day (TID) | RESPIRATORY_TRACT | Status: DC
  Administered 2015-09-01 – 2015-09-02 (×5): 3 mL via RESPIRATORY_TRACT
  Filled 2015-08-31 (×5): qty 3

## 2015-08-31 NOTE — Progress Notes (Signed)
Respiratory assessment done and patient scored a 7. Tolerating everything well, no wheezes heard on exam. Patient had some rhonchi in the upper lobes but cleared with cough. X ray was clear at this time. Patient does state she takes nebs at home when she gets up, but only PRN after that. Per protocol changed to TID schedule and keeping PRN for any other times the need arises.

## 2015-08-31 NOTE — Progress Notes (Signed)
PROGRESS NOTE    Elizabeth Pugh  [id number] DOB: Dec 26, 1926 DOA: 08/29/2015 PCP: Philis Fendt, MD  Outpatient Specialists: GI Dr. Benson [place]   Brief Narrative: Elizabeth Pugh is a 82 y.o. female with medical history significant for diabetes on insulin, morbid obesity, chronic diastolic heart failure with mild concentric hypertrophy, chronic lower extremity edema, bedbound 1 year secondary to history of CVA and severe osteoarthritis, chronic atrial fibrillation on anticoagulation, hypertension, seizure disorder, history of pulmonary embolism September 2016 and chronic thrombocytopenia. Patient reports that about 2 days ago she went her primary care doctor because of bronchitis symptoms and was started on Bactrim. She was found to be dyspneic with a pulse ox of 80% on room air by her physical therapist. CXR clear. Admitted for acute bronchitis/PNA   Assessment & Plan:   Acute bronchitis with acute hypoxic respiratory failure - On O2 as her pulse ox low yesterday, she thinks her breathing is improved today - Continue levaquin - Continue PRN nebulizer - Add guaifenesin today and scheduled nebulizers - Very difficult pulmonary exam. - She does have an odd syntax and affect on exam this morning - New nebulizer in room  Anxiety/Depression - She is on alprazolam PRN.  Careful administration given respiratory status noted above.  - Doxepin  AKI and Acute hyperkalemia - Holding her home lasix and she was give some fluids - Her upper airway issues may be related to too much fluid being given - She has LE edema - Renal function back to baseline but potassium mildly high - restart lasix  Pedal edema - Restart lasix as noted above - TED hose ordered, should be placed - Chronic L>>R and she is on Eliquis - Monitor for skin breakdown  Chronic atrial fibrillation - Continue home medications of Eliquis and Carvedilol  DM (diabetes mellitus), type 2, uncontrolled with complications  -  Continue lantus, amaryl, SSI - BG ranging in 140s-150s  History of pulmonary embolism - Continue Eliquis, her exam is concerning for recurrence, but she is reported to have been taking this regularly    CVA, old, hemiparesis  - Bedbound for many years, turn frequently in bed    Hypertension - Continue norvasc and coreg - Lasix will be restarted - Micardis on hold    Restless leg syndrome - Continue requip    Seizure disorder - Continue keppra    Chronic diastolic heart failure  - She was initially dry, lasix held - Now appears to be mildly volume overloaded - Restart lasix - Continue cardiac medications as noted above.     Thrombocytopenia - appears to be at baseline, trend if needed    Diet Carb modified   DVT prophylaxis: Eliquis Code Status:DNR Family Communication: Attempted to contact daughter, Elizabeth Pugh, at both numbers provided.  VM was full on mobile Disposition Plan: Pending improvement, 1-2 days    Consultants:   none  Procedures:   none  Antimicrobials:  Levaquin    Subjective: States she feels mildly better, but she was awoken at 4am for a treatment which made her tired.  She is on oxygen, breathing comfortably.  Has quite a bit of upper airway noise, added guaifenesin.   Objective: Filed Vitals:   08/30/15 1321 08/30/15 2100 08/31/15 0100 08/31/15 0300  BP: 123/63 124/71 130/68 136/65  Pulse: 74 82 88 99  Temp: 98.2 F (36.8 C) 97.6 F (36.4 C) 97.4 F (36.3 C) 97.7 F (36.5 C)  TempSrc: Oral Oral Oral Oral  Resp: 16 16 16  SpO2: 100% 98% 99% 99%   No intake or output data in the 24 hours ending 08/31/15 1025 There were no vitals filed for this visit.  Examination:  General exam: Appears calm and comfortable  Respiratory system: Course upper airway breath sounds, [place] in place. Respiratory effort normal.  Could not do good posterior exam due to patient body habitus Cardiovascular system: S1 & S2 heard, + pedal edema L> R, chronic  venous stasis changes with doughy edema, skin changes and erythema Gastrointestinal system: Abdomen is obese, soft and nontender.  Central nervous system: Alert and oriented. No focal neurological deficits. Skin: No rashes, lesions or ulcers Psychiatry: Mood & affect appropriate.     Data Reviewed: I have personally reviewed following labs and imaging studies  CBC:  Recent Labs Lab 08/29/15 1147  WBC 2.6*  NEUTROABS 1.3*  HGB 13.4  HCT 42.7  MCV 93.4  PLT [id number]*   Basic Metabolic Panel:  Recent Labs Lab 08/29/15 1147 08/29/15 1957 08/31/15 0348  NA 138 138 138  K 5.8* 5.5* 5.4*  CL 101 102 103  CO2 29 29 28   GLUCOSE 233* 146* 123*  BUN 22* 21* 20  CREATININE 1.05* 0.99 0.99  CALCIUM 8.9 9.0 8.7*   GFR: CrCl cannot be calculated (Unknown ideal weight.). Liver Function Tests:  Recent Labs Lab 08/29/15 1147  AST 18  ALT 10*  ALKPHOS 58  BILITOT 0.8  PROT 6.3*  ALBUMIN 3.2*   HbA1C:  Recent Labs  08/29/15 1615  HGBA1C 9.6*   CBG:  Recent Labs Lab 08/30/15 0954 08/30/15 1226 08/30/15 1709 08/30/15 2141 08/31/15 0650  GLUCAP 170* 148* 121* 89 144*   Urine analysis:    Component Value Date/Time   COLORURINE YELLOW 08/29/2015 [name] 08/29/2015 1156   LABSPEC 1.023 08/29/2015 1156   PHURINE 6.0 08/29/2015 1156   GLUCOSEU 100* 08/29/2015 1156   HGBUR NEGATIVE 08/29/2015 1156   BILIRUBINUR NEGATIVE 08/29/2015 1156   KETONESUR NEGATIVE 08/29/2015 1156   PROTEINUR NEGATIVE 08/29/2015 1156   UROBILINOGEN 1.0 01/19/2015 2241   NITRITE NEGATIVE 08/29/2015 1156   LEUKOCYTESUR NEGATIVE 08/29/2015 1156   Sepsis Labs: @LABRCNTIP (procalcitonin:4,lacticidven:4)  ) Recent Results (from the past 240 hour(s))  Culture, blood (Routine X 2) w Reflex to ID Panel     Status: None (Preliminary result)   Collection Time: 08/29/15 11:45 AM  Result Value Ref Range Status   Specimen Description BLOOD RIGHT HAND  Final   Special Requests  BOTTLES DRAWN AEROBIC AND ANAEROBIC 5CC  Final   Culture NO GROWTH < 24 HOURS  Final   Report Status PENDING  Incomplete  Culture, blood (Routine X 2) w Reflex to ID Panel     Status: None (Preliminary result)   Collection Time: 08/29/15  4:15 PM  Result Value Ref Range Status   Specimen Description BLOOD LEFT HAND  Final   Special Requests IN PEDIATRIC BOTTLE 1CC  Final   Culture NO GROWTH < 24 HOURS  Final   Report Status PENDING  Incomplete       Radiology Studies: Dg Chest Port 1 View  08/29/2015  CLINICAL DATA:  Shortness of breath today History:  Diabetes Hypertension GERD EXAM: PORTABLE CHEST 1 VIEW COMPARISON:  01/27/2015 FINDINGS: Cardiac silhouette mildly enlarged. No mediastinal or hilar masses or evidence of adenopathy. There mild prominence of the bronchovascular markings bilaterally accentuated by the AP technique and patient's body habitus. No overt pulmonary edema. No evidence of pneumonia. No convincing pleural effusion and  no pneumothorax. Bony thorax is demineralized. There are changes prior shoulder surgery the left, stable. IMPRESSION: No acute cardiopulmonary disease. Electronically Signed   By: Lajean Manes M.D.   On: 08/29/2015 12:33      Scheduled Meds: . amLODipine  5 mg Oral Daily  . antiseptic oral rinse  7 mL Mouth Rinse BID  . apixaban  5 mg Oral Q12H  . dextrose  1 ampule Intravenous Once  . doxepin  25 mg Oral QHS  . glimepiride  2 mg Oral QAC breakfast  . guaiFENesin  1,200 mg Oral BID  . insulin aspart  0-15 Units Subcutaneous TID WC  . insulin aspart  0-5 Units Subcutaneous QHS  . insulin glargine  20 Units Subcutaneous QHS  . levETIRAcetam  500 mg Oral BID  . levofloxacin  250 mg Oral Daily  . pantoprazole  40 mg Oral Q0600  . pregabalin  75 mg Oral BID  . rOPINIRole  2 mg Oral QID   Continuous Infusions:    LOS: 2 days    Time spent: 35 minutes    Gilles Chiquito, MD [facility] Pager [phone]  If 7PM-7AM, please  contact night-coverage [email or web address] Password [facility] 08/31/2015, 10:25 AM

## 2015-09-01 DIAGNOSIS — E875 Hyperkalemia: Secondary | ICD-10-CM | POA: Diagnosis not present

## 2015-09-01 DIAGNOSIS — R0600 Dyspnea, unspecified: Secondary | ICD-10-CM | POA: Diagnosis not present

## 2015-09-01 DIAGNOSIS — I482 Chronic atrial fibrillation: Secondary | ICD-10-CM | POA: Diagnosis not present

## 2015-09-01 DIAGNOSIS — J209 Acute bronchitis, unspecified: Secondary | ICD-10-CM | POA: Diagnosis not present

## 2015-09-01 DIAGNOSIS — J9601 Acute respiratory failure with hypoxia: Secondary | ICD-10-CM | POA: Diagnosis not present

## 2015-09-01 LAB — CBC
HCT: 41.4 % (ref 36.0–46.0)
Hemoglobin: 13.4 g/dL (ref 12.0–15.0)
MCH: 30 pg (ref 26.0–34.0)
MCHC: 32.4 g/dL (ref 30.0–36.0)
MCV: 92.8 fL (ref 78.0–100.0)
Platelets: 114 10*3/uL — ABNORMAL LOW (ref 150–400)
RBC: 4.46 MIL/uL (ref 3.87–5.11)
RDW: 13.8 % (ref 11.5–15.5)
WBC: 3.5 10*3/uL — AB (ref 4.0–10.5)

## 2015-09-01 LAB — BASIC METABOLIC PANEL
ANION GAP: 9 (ref 5–15)
ANION GAP: 7 (ref 5–15)
BUN: 24 mg/dL — ABNORMAL HIGH (ref 6–20)
BUN: 24 mg/dL — AB (ref 6–20)
CHLORIDE: 105 mmol/L (ref 101–111)
CHLORIDE: 105 mmol/L (ref 101–111)
CO2: 22 mmol/L (ref 22–32)
CO2: 25 mmol/L (ref 22–32)
Calcium: 8.8 mg/dL — ABNORMAL LOW (ref 8.9–10.3)
Calcium: 8.9 mg/dL (ref 8.9–10.3)
Creatinine, Ser: 1.02 mg/dL — ABNORMAL HIGH (ref 0.44–1.00)
Creatinine, Ser: 1.03 mg/dL — ABNORMAL HIGH (ref 0.44–1.00)
GFR calc Af Amer: 56 mL/min — ABNORMAL LOW (ref >60)
GFR calc non Af Amer: 48 mL/min — ABNORMAL LOW (ref >60)
GFR, EST AFRICAN AMERICAN: 55 mL/min — AB (ref >60)
GFR, EST NON AFRICAN AMERICAN: 47 mL/min — AB (ref >60)
Glucose, Bld: 174 mg/dL — ABNORMAL HIGH (ref 65–99)
Glucose, Bld: 223 mg/dL — ABNORMAL HIGH (ref 65–99)
POTASSIUM: 5.8 mmol/L — AB (ref 3.5–5.1)
POTASSIUM: 5.2 mmol/L — AB (ref 3.5–5.1)
SODIUM: 136 mmol/L (ref 135–145)
SODIUM: 137 mmol/L (ref 135–145)

## 2015-09-01 LAB — GLUCOSE, CAPILLARY
GLUCOSE-CAPILLARY: 237 mg/dL — AB (ref 65–99)
GLUCOSE-CAPILLARY: 188 mg/dL — AB (ref 65–99)
GLUCOSE-CAPILLARY: 177 mg/dL — AB (ref 65–99)
Glucose-Capillary: 149 mg/dL — ABNORMAL HIGH (ref 65–99)

## 2015-09-01 MED ORDER — SODIUM CHLORIDE 0.9 % IV SOLN
INTRAVENOUS | Status: AC
  Administered 2015-09-01: 14:00:00 via INTRAVENOUS

## 2015-09-01 MED ORDER — HYDROXYZINE HCL 10 MG PO TABS
10.0000 mg | ORAL_TABLET | Freq: Three times a day (TID) | ORAL | Status: DC | PRN
  Administered 2015-09-01: 10 mg via ORAL
  Filled 2015-09-01 (×2): qty 1

## 2015-09-01 MED ORDER — SODIUM POLYSTYRENE SULFONATE 15 GM/60ML PO SUSP
30.0000 g | Freq: Once | ORAL | Status: AC
  Administered 2015-09-01: 30 g via ORAL
  Filled 2015-09-01 (×2): qty 120

## 2015-09-01 NOTE — Progress Notes (Signed)
PROGRESS NOTE    [name]  [id number] DOB: March 19, 1927 DOA: 08/29/2015 PCP: Philis Fendt, MD  Outpatient Specialists: GI Dr. Benson [place]   Brief Narrative: Elizabeth Pugh is a 82 y.o. female with medical history significant for diabetes on insulin, morbid obesity, chronic diastolic heart failure with mild concentric hypertrophy, chronic lower extremity edema, bedbound 1 year secondary to history of CVA and severe osteoarthritis, chronic atrial fibrillation on anticoagulation, hypertension, seizure disorder, history of pulmonary embolism September 2016 and chronic thrombocytopenia. Patient reports that about 2 days ago she went her primary care doctor because of bronchitis symptoms and was started on Bactrim. She was found to be dyspneic with a pulse ox of 80% on room air by her physical therapist. CXR clear. Admitted for acute bronchitis/PNA   Assessment & Plan:   Acute bronchitis with acute hypoxic respiratory failure - She is now off O2, doing very well - Continue levaquin, 5 day course of Abx - Continue scheduled and PRN nebulizer - Continue guaifenesin  Anxiety/Depression - She is on alprazolam PRN.   - Doxepin  AKI and Acute hyperkalemia - Add back home lasix, renal function improved but now hyperkalemic to 5.8 - Kayexalate X 1 - EKG and repeat labs this evening, repeat K was 5.2 - If still hyperkalemic will give insulin, calcium and D50 - Unclear cause, we discussed dietary intake of K today and she did not think she was taking anything with high levels of K at home.  - Renal function unchanged.   Pedal edema - Restart lasix as noted above - TED hose ordered, should be placed while inpatient - Chronic L>>R and she is on Eliquis - Monitor for skin breakdown - Increased itching in legs today, start hydroxyzine  Chronic atrial fibrillation - Continue home medications of Eliquis and Carvedilol  DM (diabetes mellitus), type 2, uncontrolled with complications  -  Continue lantus, amaryl, SSI - BG ranging in the 200s today  History of pulmonary embolism - Continue Eliquis    CVA, old, hemiparesis  - Bedbound for many years, turn frequently in bed    Hypertension - BP soft today, holding amlodipine for tomorrow.  - Lasix continued for now, consider halving dose - Continue coreg.  - Micardis on hold    Restless leg syndrome - Continue requip    Seizure disorder - Continue keppra    Chronic diastolic heart failure  - Continue cardiac medications as noted above.     Thrombocytopenia - appears to be at baseline, trend if needed    Diet Carb modified   DVT prophylaxis: Eliquis Code Status:DNR Family Communication: Spoke with daughter daphne yesterday Disposition Plan: Likely discharge tomorrow   Consultants:   none  Procedures:   none  Antimicrobials:  Levaquin 08/29/15 --> 09/02/15   Subjective: She is improved subjectively and objectively today from pulmonary standpoint.  She notes increased itching in her legs.   Objective: Filed Vitals:   08/31/15 2058 09/01/15 0435 09/01/15 1434 09/01/15 1500  BP: 97/57 125/57  85/52  Pulse: 93 63  51  Temp: 97.5 F (36.4 C) 98 F (36.7 C)  97.5 F (36.4 C)  TempSrc: Oral Axillary  Oral  Resp: 20 18  11   SpO2: 92% 97% 97% 96%    Intake/Output Summary (Last 24 hours) at 09/01/15 1739 Last data filed at 09/01/15 0824  Gross per 24 hour  Intake    240 ml  Output      0 ml  Net  240 ml   There were no vitals filed for this visit.  Examination:  General exam: Appears calm and comfortable  Respiratory system: Course upper airway breath sounds, Respiratory effort normal.  Could not do good posterior exam due to patient body habitus Cardiovascular system: S1 & S2 heard, + pedal edema L> R, chronic venous stasis changes, no change from yesterday Gastrointestinal system: Abdomen is obese, soft and nontender.  Central nervous system: Alert and oriented. No focal neurological  deficits. Skin: No rashes, lesions or ulcers noted on legs Psychiatry: Mood & affect appropriate.     Data Reviewed: I have personally reviewed following labs and imaging studies  CBC:  Recent Labs Lab 08/29/15 1147 09/01/15 0743  WBC 2.6* 3.5*  NEUTROABS 1.3*  --   HGB 13.4 13.4  HCT 42.7 41.4  MCV 93.4 92.8  PLT 127* [id number]*   Basic Metabolic Panel:  Recent Labs Lab 08/29/15 1147 08/29/15 1957 08/31/15 0348 09/01/15 0743 09/01/15 1508  NA 138 138 138 136 137  K 5.8* 5.5* 5.4* 5.8* 5.2*  CL 101 102 103 105 105  CO2 29 29 28 22 25   GLUCOSE 233* 146* 123* 174* 223*  BUN 22* 21* 20 24* 24*  CREATININE 1.05* 0.99 0.99 1.02* 1.03*  CALCIUM 8.9 9.0 8.7* 8.8* 8.9   GFR: CrCl cannot be calculated (Unknown ideal weight.). Liver Function Tests:  Recent Labs Lab 08/29/15 1147  AST 18  ALT 10*  ALKPHOS 58  BILITOT 0.8  PROT 6.3*  ALBUMIN 3.2*   HbA1C: No results for input(s): HGBA1C in the last 72 hours. CBG:  Recent Labs Lab 08/31/15 1205 08/31/15 1716 08/31/15 2056 09/01/15 0631 09/01/15 1131  GLUCAP 150* 109* 136* 149* 237*   Urine analysis:    Component Value Date/Time   COLORURINE YELLOW 08/29/2015 [place] 08/29/2015 1156   LABSPEC 1.023 08/29/2015 1156   PHURINE 6.0 08/29/2015 1156   GLUCOSEU 100* 08/29/2015 1156   HGBUR NEGATIVE 08/29/2015 1156   BILIRUBINUR NEGATIVE 08/29/2015 1156   KETONESUR NEGATIVE 08/29/2015 1156   PROTEINUR NEGATIVE 08/29/2015 1156   UROBILINOGEN 1.0 01/19/2015 2241   NITRITE NEGATIVE 08/29/2015 1156   LEUKOCYTESUR NEGATIVE 08/29/2015 1156   Sepsis Labs: @LABRCNTIP (procalcitonin:4,lacticidven:4)  ) Recent Results (from the past 240 hour(s))  Culture, blood (Routine X 2) w Reflex to ID Panel     Status: None (Preliminary result)   Collection Time: 08/29/15 11:45 AM  Result Value Ref Range Status   Specimen Description BLOOD RIGHT HAND  Final   Special Requests BOTTLES DRAWN AEROBIC AND  ANAEROBIC 5CC  Final   Culture NO GROWTH 3 DAYS  Final   Report Status PENDING  Incomplete  Culture, blood (Routine X 2) w Reflex to ID Panel     Status: None (Preliminary result)   Collection Time: 08/29/15  4:15 PM  Result Value Ref Range Status   Specimen Description BLOOD LEFT HAND  Final   Special Requests IN PEDIATRIC BOTTLE 1CC  Final   Culture NO GROWTH 3 DAYS  Final   Report Status PENDING  Incomplete       Radiology Studies: No results found.    Scheduled Meds: . antiseptic oral rinse  7 mL Mouth Rinse BID  . apixaban  5 mg Oral Q12H  . carvedilol  20 mg Oral Daily  . dextrose  1 ampule Intravenous Once  . doxepin  25 mg Oral QHS  . furosemide  20 mg Oral BID  . glimepiride  2  mg Oral QAC breakfast  . guaiFENesin  1,200 mg Oral BID  . insulin aspart  0-15 Units Subcutaneous TID WC  . insulin aspart  0-5 Units Subcutaneous QHS  . insulin glargine  20 Units Subcutaneous QHS  . ipratropium-albuterol  3 mL Nebulization TID  . levETIRAcetam  500 mg Oral BID  . levofloxacin  250 mg Oral Daily  . pantoprazole  40 mg Oral Q0600  . pregabalin  75 mg Oral BID  . rOPINIRole  2 mg Oral QID   Continuous Infusions: . sodium chloride 100 mL/hr at 09/01/15 1332     LOS: 3 days    Time spent: 35 minutes    Gilles Chiquito, MD [facility] Pager [phone]  If 7PM-7AM, please contact night-coverage [email or web address] Password [facility] 09/01/2015, 5:39 PM

## 2015-09-02 ENCOUNTER — Encounter (HOSPITAL_COMMUNITY): Payer: Self-pay | Admitting: Internal Medicine

## 2015-09-02 DIAGNOSIS — Z794 Long term (current) use of insulin: Secondary | ICD-10-CM | POA: Diagnosis not present

## 2015-09-02 DIAGNOSIS — J9601 Acute respiratory failure with hypoxia: Secondary | ICD-10-CM | POA: Diagnosis not present

## 2015-09-02 DIAGNOSIS — R0600 Dyspnea, unspecified: Secondary | ICD-10-CM | POA: Diagnosis present

## 2015-09-02 DIAGNOSIS — J209 Acute bronchitis, unspecified: Secondary | ICD-10-CM | POA: Diagnosis not present

## 2015-09-02 DIAGNOSIS — I482 Chronic atrial fibrillation: Secondary | ICD-10-CM | POA: Diagnosis not present

## 2015-09-02 DIAGNOSIS — I5032 Chronic diastolic (congestive) heart failure: Secondary | ICD-10-CM | POA: Diagnosis not present

## 2015-09-02 DIAGNOSIS — N179 Acute kidney failure, unspecified: Secondary | ICD-10-CM | POA: Diagnosis not present

## 2015-09-02 DIAGNOSIS — Z7401 Bed confinement status: Secondary | ICD-10-CM | POA: Diagnosis not present

## 2015-09-02 DIAGNOSIS — Z66 Do not resuscitate: Secondary | ICD-10-CM | POA: Diagnosis not present

## 2015-09-02 DIAGNOSIS — G2581 Restless legs syndrome: Secondary | ICD-10-CM | POA: Diagnosis not present

## 2015-09-02 DIAGNOSIS — E1165 Type 2 diabetes mellitus with hyperglycemia: Secondary | ICD-10-CM | POA: Diagnosis not present

## 2015-09-02 DIAGNOSIS — G40909 Epilepsy, unspecified, not intractable, without status epilepticus: Secondary | ICD-10-CM | POA: Diagnosis not present

## 2015-09-02 DIAGNOSIS — D696 Thrombocytopenia, unspecified: Secondary | ICD-10-CM | POA: Diagnosis not present

## 2015-09-02 DIAGNOSIS — J181 Lobar pneumonia, unspecified organism: Secondary | ICD-10-CM | POA: Diagnosis not present

## 2015-09-02 DIAGNOSIS — E875 Hyperkalemia: Secondary | ICD-10-CM

## 2015-09-02 DIAGNOSIS — I69359 Hemiplegia and hemiparesis following cerebral infarction affecting unspecified side: Secondary | ICD-10-CM | POA: Diagnosis not present

## 2015-09-02 DIAGNOSIS — I110 Hypertensive heart disease with heart failure: Secondary | ICD-10-CM | POA: Diagnosis not present

## 2015-09-02 DIAGNOSIS — Z86711 Personal history of pulmonary embolism: Secondary | ICD-10-CM | POA: Diagnosis not present

## 2015-09-02 HISTORY — DX: Lobar pneumonia, unspecified organism: J18.1

## 2015-09-02 HISTORY — DX: Acute kidney failure, unspecified: N17.9

## 2015-09-02 LAB — GLUCOSE, CAPILLARY
GLUCOSE-CAPILLARY: 148 mg/dL — AB (ref 65–99)
GLUCOSE-CAPILLARY: 195 mg/dL — AB (ref 65–99)

## 2015-09-02 MED ORDER — GUAIFENESIN ER 600 MG PO TB12: 1200.0000 mg | ORAL_TABLET | Freq: Two times a day (BID) | ORAL | Status: DC

## 2015-09-02 MED ORDER — LEVOFLOXACIN 250 MG PO TABS: 250.0000 mg | ORAL_TABLET | Freq: Every day | ORAL | Status: AC

## 2015-09-02 NOTE — Evaluation (Signed)
Physical Therapy Evaluation Patient Details Name: Elizabeth Pugh MRN: [id number] DOB: 25-Sep-1926 Today's Date: 09/02/2015   History of Present Illness  Elizabeth Pugh is a 82 y.o. female with medical history significant for diabetes on insulin, morbid obesity, chronic diastolic heart failure with mild concentric hypertrophy, chronic lower extremity edema, bedbound 1 year secondary to history of CVA and severe osteoarthritis, chronic atrial fibrillation on anticoagulation, hypertension, seizure disorder, history of pulmonary embolism September 2016 and chronic thrombocytopenia.  Patient admitted with bronchitis and hypoxia.  Clinical Impression  Patient presents close to baseline for mobility, but with severe dependencies for mobility at this time.  Feel continued skilled HHPT indicated for further ROM/LE strengthening activities as well as more EOB/OOB activities than pt currently able to tolerate.  Feel she may need further home equipment assessment as she reports her bed is old and may need new overlay as she is at very high risk for skin breakdown.  PT signing off acutely due to planned d/c home later today.    Follow Up Recommendations Home health PT    Equipment Recommendations  None recommended by PT    Recommendations for Other Services       Precautions / Restrictions Precautions Precautions: Fall      Mobility  Bed Mobility Overal bed mobility: Needs Assistance Bed Mobility: Rolling Rolling: Max assist         General bed mobility comments: assisted with rail to roll in bed and assisted with hygiene due to stool and urinary incontinence.  Patient able to hold herself on side better on L side than R with rail; EOB not attempted due to not safe without +2 A  Transfers                    Ambulation/Gait                Stairs            Wheelchair Mobility    Modified Rankin (Stroke Patients Only)       Balance                                             Pertinent Vitals/Pain Pain Assessment: Faces Faces Pain Scale: Hurts little more Pain Location: legs with AAROM Pain Descriptors / Indicators: Sore Pain Intervention(s): Monitored during session;Repositioned    Home Living Family/patient expects to be discharged to:: Private residence Living Arrangements: Children Available Help at Discharge: Family;Available 24 hours/day Type of Home: House Home Access: Ramped entrance     Home Layout: One level Home Equipment: Hospital bed;Transport chair;Electric scooter;Bedside commode;Other (comment) (hoyer lift)      Prior Function Level of Independence: Needs assistance   Gait / Transfers Assistance Needed: Total assist  ADL's / Homemaking Assistance Needed: Total assist for bathing, dressing, toileting.        Hand Dominance   Dominant Hand: Right    Extremity/Trunk Assessment   Upper Extremity Assessment:  (not formally tested, uses rail on both sides for rolling in bed)           Lower Extremity Assessment: RLE deficits/detail;LLE deficits/detail RLE Deficits / Details: AAROM grossly 50 degrees hip flexion and 20 degrees knee flexion, able to extend knee actively when flexed, ankle AROM WFL LLE Deficits / Details: AAROM grossly 50 degrees hip flexion and 20 degrees knee flexion, able to  extend knee actively when flexed, ankle AROM WFL; noted increased edema L LE compared to R and some blisters versus lymphatic skin changes on L hip  Cervical / Trunk Assessment: Kyphotic  Communication   Communication: No difficulties  Cognition Arousal/Alertness: Awake/alert Behavior During Therapy: WFL for tasks assessed/performed Overall Cognitive Status: Within Functional Limits for tasks assessed                      General Comments General comments (skin integrity, edema, etc.): noted areas of excoriation under rolls on abdomen and applied nystatin powder, nurse tech in after assisted  to clean on one side and assisted on other side; placed pillow under R side due to leaning to that side when I entered room    Exercises General Exercises - Lower Extremity Ankle Circles/Pumps: AROM;Both;10 reps;Supine Heel Slides: AAROM;Both;5 reps;10 reps;Supine Hip Flexion/Marching: AAROM;Both;5 reps;Supine      Assessment/Plan    PT Assessment All further PT needs can be met in the next venue of care  PT Diagnosis Generalized weakness   PT Problem List Decreased mobility;Decreased range of motion;Decreased strength;Decreased activity tolerance;Obesity  PT Treatment Interventions     PT Goals (Current goals can be found in the Care Plan section) Acute Rehab PT Goals PT Goal Formulation: All assessment and education complete, DC therapy    Frequency     Barriers to discharge        Co-evaluation               End of Session   Activity Tolerance: Patient tolerated treatment well Patient left: in bed;with call bell/phone within reach Nurse Communication: Need for lift equipment    Functional Assessment Tool Used: Clinical Judgement Functional Limitation: Changing and maintaining body position Changing and Maintaining Body Position Current Status (Y7092): At least 60 percent but less than 80 percent impaired, limited or restricted Changing and Maintaining Body Position Goal Status (H5747): At least 60 percent but less than 80 percent impaired, limited or restricted Changing and Maintaining Body Position Discharge Status [phone]): At least 60 percent but less than 80 percent impaired, limited or restricted    Time: 1025-1101 PT Time Calculation (min) (ACUTE ONLY): 36 min   Charges:   PT Evaluation $PT Eval Moderate Complexity: 1 Procedure PT Treatments $Therapeutic Exercise: 8-22 mins   PT G Codes:   PT G-Codes **NOT FOR INPATIENT CLASS** Functional Assessment Tool Used: Clinical Judgement Functional Limitation: Changing and maintaining body position Changing  and Maintaining Body Position Current Status (Q9643): At least 60 percent but less than 80 percent impaired, limited or restricted Changing and Maintaining Body Position Goal Status (C3818): At least 60 percent but less than 80 percent impaired, limited or restricted Changing and Maintaining Body Position Discharge Status [phone]): At least 60 percent but less than 80 percent impaired, limited or restricted    Reginia Naas 09/02/2015, 11:20 AM  Magda Kiel, [place] 09/02/2015

## 2015-09-02 NOTE — Progress Notes (Signed)
[facility] called for transportation home 3:15pm.

## 2015-09-02 NOTE — Discharge Summary (Addendum)
Physician Discharge Summary  Elizabeth Pugh [id number] DOB: Jan 27, 1927 DOA: 08/29/2015  PCP: Philis Fendt, MD  Admit date: 08/29/2015 Discharge date: 09/02/2015  Time spent 30 minutes  Recommendations for Outpatient Follow-up:  1. Discharge  home with home health. Patient will complete 7 day course of antibiotics on 5/3. 2. Follow-up with PCP in one week. Recheck renal function during outpatient visit.   Discharge Diagnoses:  Principal Problem:   Acute bronchitis   Active Problems:   Lobar pneumonia due to unspecified organism   Acute respiratory failure with hypoxia (HCC)   DM (diabetes mellitus), type 2, uncontrolled with complications (HCC)   CVA, old, hemiparesis ([place])   Hypertension   Restless leg syndrome   Chronic atrial fibrillation (HCC)   Seizure disorder (HCC)   History of pulmonary embolism   Chronic diastolic heart failure (HCC)   Thrombocytopenia (HCC)   Acute hyperkalemia   Bilateral leg edema   Acute kidney injury ([place])    Discharge Condition: Fair  Diet recommendation: Diabetic/heart healthy  CODE STATUS: DO NOT RESUSCITATE   There were no vitals filed for this visit.  History of present illness:  Please refer to admission H&P for details, in brief, 82 year old female with history of diabetes mellitus on insulin, morbid obesity, bedbound status following stroke one year back, chronic diastolic CHF, chronic bilateral lower extremity edema, history of PE on Eliquis since September 2016, chronic thrombocytopenia so her PCP 2 days prior to admission for productive cough with clear sputum and weakness. Her physical therapist found patient to be dyspneic with O2 sat of 80% on room air and sent to the ED. Vitals in the ED were normal. Chest x-ray unremarkable. O2 sat improved to 96% on 2 L. She had elevated potassium of 5.8 and creatinine 1.05. Glucose of 233. Labs otherwise unremarkable. Admit to hospitalist service for acute  bronchitis/pneumonia. Off note patient was seen by her PCP about 10 days prior to hospitalization for similar symptoms and was given a course of Bactrim (per daughter she hadn't completed the course yet)    Hospital Course:  Acute hypoxic respiratory failure secondary to acute bronchitis/lower pneumonia Placed on empiric Levaquin, symptoms better, cough improved and remains afebrile. Now off oxygen and maintaining O2 sat on room air. Given when necessary nebulizer and antitussives. Blood cultures negative. Flu PCR negative. I will discharge her on oral Levaquin to complete 7 day course of antibiotic.  Acute kidney injury with acute hyperkalemia Given Kayexalate with some improvement. Discontinued Lasix and ARB. Does not take diet rich in potassium. Also i think these symptoms could be due to recent course of bactrim. Would continue to hold Lasix and ARB upon discharge and have patient follow-up with her PCP in one week to have labs checked.   Chronic diastolic CHF with pitting edema Lasix restarted but given her mild renal dysfunction I will hold off on it for now. Has chronic L>>R leg swelling. Doppler negative for DVT. If renal function stable during outpatient follow-up in one week Lasix can be resumed.  Chronic A. fib and history of PE Continue Eliquis. Also on Coreg.  Uncontrolled type 2 diabetes mellitus CBG stable. Resume home dose insulin and Amaryl.  CVA with hemiparesis Patient is bedbound.  Essential hypertension Soft blood pressure while in the hospital. Had resumed amlodipine and Coreg. Hold Lasix and ARB.   History of seizures Continue Keppra.  Thrombocytopenia At baseline  Restless leg syndrome Continue Requip  Procedures:  None  Consultations:  None  Family  communication: Discussed with daughter [name] on the phone  Discharge Exam: Filed Vitals:   09/01/15 1941 09/02/15 0437  BP: 102/56 123/70  Pulse: 112 94  Temp: 97.9 F (36.6 C) 97.5 F  (36.4 C)  Resp: 16 16    General: Elderly obese female not in distress HEENT: No pallor, moist mucosa, supple neck Chest: Clear bilaterally CVS: Normal S1 and S2, no murmurs or gallop GI: Soft, nondistended, nontender, bowel sounds present Musculoskeletal: Bilateral trace pitting edema ( L>R) CNS: Alert and oriented   Discharge Instructions    Current Discharge Medication List    START taking these medications   Details  Elastic Bandages & Supports (T.E.D. BELOW KNEE/MEDIUM) MISC On in AM and off at bedtime Size dependant on patient's measurements Qty: 3 each, Refills: 0    guaiFENesin (MUCINEX) 600 MG 12 hr tablet Take 2 tablets (1,200 mg total) by mouth 2 (two) times daily. Qty: 10 tablet, Refills: 0    levofloxacin (LEVAQUIN) 250 MG tablet Take 1 tablet (250 mg total) by mouth daily. Qty: 2 tablet, Refills: 0      CONTINUE these medications which have NOT CHANGED   Details  albuterol (PROVENTIL HFA;VENTOLIN HFA) 108 (90 BASE) MCG/ACT inhaler Inhale 1 puff into the lungs every 6 (six) hours as needed for wheezing or shortness of breath.    ALPRAZolam (XANAX) 0.25 MG tablet Take 1 tablet (0.25 mg total) by mouth 2 (two) times daily as needed for anxiety. Qty: 30 tablet, Refills: 0    amLODipine (NORVASC) 5 MG tablet Take 5 mg by mouth daily. Refills: 3    apixaban (ELIQUIS) 5 MG TABS tablet Take 1 tablet (5 mg total) by mouth every 12 (twelve) hours. Qty: 60 tablet    calcium-vitamin D (CALCIUM 500+D) 500-400 MG-UNIT per tablet Take 1 tablet by mouth 2 (two) times daily.    carvedilol (COREG CR) 20 MG 24 hr capsule Take 1 capsule (20 mg total) by mouth daily. Qty: 30 capsule, Refills: 0    doxepin (SINEQUAN) 25 MG capsule Take 25 mg by mouth at bedtime.    esomeprazole (NEXIUM) 40 MG capsule Take 40 mg by mouth daily as needed (heart burn).     glimepiride (AMARYL) 2 MG tablet Take 2 mg by mouth daily before breakfast.    hydrOXYzine (ATARAX/VISTARIL) 25 MG  tablet Take 25 mg by mouth 2 (two) times daily as needed for itching.     insulin aspart (NOVOLOG) 100 UNIT/ML injection Inject 0-12 Units into the skin 3 (three) times daily before meals. Under 150 = 0 units, 150 - 200 = 2 units, 201- 250 = 4 units, 251- 300 = 6 units, 301- 350 = 8 units, 351-400 = 10 units, 401-450 = 12  units    insulin glargine (LANTUS) 100 UNIT/ML injection Inject 0.2 mLs (20 Units total) into the skin at bedtime. Qty: 10 mL, Refills: 11    levETIRAcetam (KEPPRA) 500 MG tablet Take 1 tablet (500 mg total) by mouth 2 (two) times daily. Qty: 30 tablet, Refills: 2    pantoprazole (PROTONIX) 40 MG tablet Take 1 tablet (40 mg total) by mouth daily at 6 (six) AM.    pregabalin (LYRICA) 75 MG capsule Take 75 mg by mouth 2 (two) times daily.    rOPINIRole (REQUIP) 2 MG tablet Take 2 mg by mouth 4 (four) times daily.     VOLTAREN 1 % GEL Apply 1 application topically 4 (four) times daily as needed (knee pain). For pain  ferrous sulfate 325 (65 FE) MG tablet Take 325 mg by mouth daily with breakfast.    ipratropium-albuterol (DUONEB) 0.5-2.5 (3) MG/3ML SOLN Take 3 mLs by nebulization every 6 (six) hours as needed. Qty: 360 mL, Refills: 0      STOP taking these medications     furosemide (LASIX) 20 MG tablet      sulfamethoxazole-trimethoprim (BACTRIM DS,SEPTRA DS) 800-160 MG tablet      telmisartan (MICARDIS) 40 MG tablet        Allergies  Allergen Reactions  . Erythromycin Other (See Comments)    Vaginal itching  . Penicillins Rash    Has patient had a PCN reaction causing immediate rash, facial/tongue/throat swelling, SOB or lightheadedness with hypotension: No Has patient had a PCN reaction causing severe rash involving mucus membranes or skin necrosis: No Has patient had a PCN reaction that required hospitalization: No Has patient had a PCN reaction occurring within the last 10 years: No If all of the above answers are "NO", then may proceed with  Cephalosporin use.  Marland Kitchen Zithromax [Azithromycin] Other (See Comments)    gave her a yeast infection.   Follow-up Information    Follow up with [facility].   Why:  They will contact you to schedule home therapy visits.   Contact information:   [address] [place] [address] [phone]       Follow up with Philis Fendt, MD. Schedule an appointment as soon as possible for a visit in 1 week.   Specialty:  Internal Medicine   Contact information:   [address] [place] [address] [phone]        The results of significant diagnostics from this hospitalization (including imaging, microbiology, ancillary and laboratory) are listed below for reference.    Significant Diagnostic Studies: Dg Chest Port 1 View  08/29/2015  CLINICAL DATA:  Shortness of breath today History:  Diabetes Hypertension GERD EXAM: PORTABLE CHEST 1 VIEW COMPARISON:  01/27/2015 FINDINGS: Cardiac silhouette mildly enlarged. No mediastinal or hilar masses or evidence of adenopathy. There mild prominence of the bronchovascular markings bilaterally accentuated by the AP technique and patient's body habitus. No overt pulmonary edema. No evidence of pneumonia. No convincing pleural effusion and no pneumothorax. Bony thorax is demineralized. There are changes prior shoulder surgery the left, stable. IMPRESSION: No acute cardiopulmonary disease. Electronically Signed   By: Lajean Manes M.D.   On: 08/29/2015 12:33    Microbiology: Recent Results (from the past 240 hour(s))  Culture, blood (Routine X 2) w Reflex to ID Panel     Status: None (Preliminary result)   Collection Time: 08/29/15 11:45 AM  Result Value Ref Range Status   Specimen Description BLOOD RIGHT HAND  Final   Special Requests BOTTLES DRAWN AEROBIC AND ANAEROBIC 5CC  Final   Culture NO GROWTH 4 DAYS  Final   Report Status PENDING  Incomplete  Culture, blood (Routine X 2) w Reflex to ID Panel     Status: None  (Preliminary result)   Collection Time: 08/29/15  4:15 PM  Result Value Ref Range Status   Specimen Description BLOOD LEFT HAND  Final   Special Requests IN PEDIATRIC BOTTLE 1CC  Final   Culture NO GROWTH 4 DAYS  Final   Report Status PENDING  Incomplete     Labs: Basic Metabolic Panel:  Recent Labs Lab 08/29/15 1147 08/29/15 1957 08/31/15 0348 09/01/15 0743 09/01/15 1508  NA 138 138 138 136 137  K 5.8*  5.5* 5.4* 5.8* 5.2*  CL 101 102 103 105 105  CO2 29 29 28 22 25   GLUCOSE 233* 146* 123* 174* 223*  BUN 22* 21* 20 24* 24*  CREATININE 1.05* 0.99 0.99 1.02* 1.03*  CALCIUM 8.9 9.0 8.7* 8.8* 8.9   Liver Function Tests:  Recent Labs Lab 08/29/15 1147  AST 18  ALT 10*  ALKPHOS 58  BILITOT 0.8  PROT 6.3*  ALBUMIN 3.2*   No results for input(s): LIPASE, AMYLASE in the last 168 hours. No results for input(s): AMMONIA in the last 168 hours. CBC:  Recent Labs Lab 08/29/15 1147 09/01/15 0743  WBC 2.6* 3.5*  NEUTROABS 1.3*  --   HGB 13.4 13.4  HCT 42.7 41.4  MCV 93.4 92.8  PLT 127* 114*   Cardiac Enzymes: No results for input(s): CKTOTAL, CKMB, CKMBINDEX, TROPONINI in the last 168 hours. BNP: BNP (last 3 results)  Recent Labs  01/19/15 2259 01/27/15 1052 08/29/15 1147  BNP 180.3* 118.1* 102.7*    ProBNP (last 3 results) No results for input(s): PROBNP in the last 8760 hours.  CBG:  Recent Labs Lab 09/01/15 1131 09/01/15 1735 09/01/15 2141 09/02/15 0550 09/02/15 1113  GLUCAP 237* 188* 177* 148* 195*       Signed:  Louellen Molder MD.  [facility] 09/02/2015, 1:08 PM

## 2015-09-03 LAB — CULTURE, BLOOD (ROUTINE X 2)
CULTURE: NO GROWTH
CULTURE: NO GROWTH

## 2015-09-22 ENCOUNTER — Emergency Department (HOSPITAL_COMMUNITY)
Admit: 2015-09-22 | Discharge: 2015-09-22 | Disposition: A | Discharge status: Home / Self Care | Payer: Medicare Other | Attending: Emergency Medicine | Admitting: Emergency Medicine

## 2015-09-22 ENCOUNTER — Emergency Department (HOSPITAL_COMMUNITY): Payer: Medicare Other

## 2015-09-22 ENCOUNTER — Inpatient Hospital Stay (HOSPITAL_COMMUNITY)
Admission: EM | Admit: 2015-09-22 | Discharge: 2015-10-01 | DRG: 291 | Disposition: A | Discharge status: Skilled Nursing Facility | Payer: Medicare Other | Attending: Internal Medicine | Admitting: Internal Medicine

## 2015-09-22 ENCOUNTER — Encounter (HOSPITAL_COMMUNITY): Payer: Self-pay

## 2015-09-22 DIAGNOSIS — I482 Chronic atrial fibrillation: Secondary | ICD-10-CM | POA: Diagnosis present

## 2015-09-22 DIAGNOSIS — E785 Hyperlipidemia, unspecified: Secondary | ICD-10-CM | POA: Diagnosis present

## 2015-09-22 DIAGNOSIS — I509 Heart failure, unspecified: Secondary | ICD-10-CM | POA: Diagnosis not present

## 2015-09-22 DIAGNOSIS — I10 Essential (primary) hypertension: Secondary | ICD-10-CM | POA: Diagnosis present

## 2015-09-22 DIAGNOSIS — Z7901 Long term (current) use of anticoagulants: Secondary | ICD-10-CM

## 2015-09-22 DIAGNOSIS — J9611 Chronic respiratory failure with hypoxia: Secondary | ICD-10-CM

## 2015-09-22 DIAGNOSIS — IMO0002 Reserved for concepts with insufficient information to code with codable children: Secondary | ICD-10-CM

## 2015-09-22 DIAGNOSIS — I110 Hypertensive heart disease with heart failure: Secondary | ICD-10-CM | POA: Diagnosis not present

## 2015-09-22 DIAGNOSIS — K219 Gastro-esophageal reflux disease without esophagitis: Secondary | ICD-10-CM | POA: Diagnosis present

## 2015-09-22 DIAGNOSIS — M7989 Other specified soft tissue disorders: Secondary | ICD-10-CM | POA: Diagnosis not present

## 2015-09-22 DIAGNOSIS — F419 Anxiety disorder, unspecified: Secondary | ICD-10-CM | POA: Diagnosis present

## 2015-09-22 DIAGNOSIS — G40909 Epilepsy, unspecified, not intractable, without status epilepticus: Secondary | ICD-10-CM | POA: Diagnosis present

## 2015-09-22 DIAGNOSIS — Z87891 Personal history of nicotine dependence: Secondary | ICD-10-CM

## 2015-09-22 DIAGNOSIS — Z0189 Encounter for other specified special examinations: Secondary | ICD-10-CM | POA: Insufficient documentation

## 2015-09-22 DIAGNOSIS — E662 Morbid (severe) obesity with alveolar hypoventilation: Secondary | ICD-10-CM | POA: Diagnosis present

## 2015-09-22 DIAGNOSIS — Z880 Allergy status to penicillin: Secondary | ICD-10-CM

## 2015-09-22 DIAGNOSIS — G2581 Restless legs syndrome: Secondary | ICD-10-CM | POA: Diagnosis present

## 2015-09-22 DIAGNOSIS — E1140 Type 2 diabetes mellitus with diabetic neuropathy, unspecified: Secondary | ICD-10-CM | POA: Diagnosis present

## 2015-09-22 DIAGNOSIS — I5033 Acute on chronic diastolic (congestive) heart failure: Secondary | ICD-10-CM | POA: Diagnosis present

## 2015-09-22 DIAGNOSIS — Z66 Do not resuscitate: Secondary | ICD-10-CM | POA: Diagnosis present

## 2015-09-22 DIAGNOSIS — E1165 Type 2 diabetes mellitus with hyperglycemia: Secondary | ICD-10-CM | POA: Diagnosis present

## 2015-09-22 DIAGNOSIS — Z794 Long term (current) use of insulin: Secondary | ICD-10-CM

## 2015-09-22 DIAGNOSIS — Z823 Family history of stroke: Secondary | ICD-10-CM

## 2015-09-22 DIAGNOSIS — E118 Type 2 diabetes mellitus with unspecified complications: Secondary | ICD-10-CM

## 2015-09-22 DIAGNOSIS — E8809 Other disorders of plasma-protein metabolism, not elsewhere classified: Secondary | ICD-10-CM | POA: Diagnosis present

## 2015-09-22 DIAGNOSIS — J449 Chronic obstructive pulmonary disease, unspecified: Secondary | ICD-10-CM | POA: Diagnosis present

## 2015-09-22 DIAGNOSIS — Z881 Allergy status to other antibiotic agents status: Secondary | ICD-10-CM

## 2015-09-22 DIAGNOSIS — N3281 Overactive bladder: Secondary | ICD-10-CM | POA: Diagnosis present

## 2015-09-22 DIAGNOSIS — G9349 Other encephalopathy: Secondary | ICD-10-CM | POA: Diagnosis present

## 2015-09-22 DIAGNOSIS — Z7401 Bed confinement status: Secondary | ICD-10-CM

## 2015-09-22 DIAGNOSIS — Z883 Allergy status to other anti-infective agents status: Secondary | ICD-10-CM

## 2015-09-22 DIAGNOSIS — I69354 Hemiplegia and hemiparesis following cerebral infarction affecting left non-dominant side: Secondary | ICD-10-CM

## 2015-09-22 DIAGNOSIS — G9340 Encephalopathy, unspecified: Secondary | ICD-10-CM | POA: Insufficient documentation

## 2015-09-22 DIAGNOSIS — J9612 Chronic respiratory failure with hypercapnia: Secondary | ICD-10-CM | POA: Diagnosis present

## 2015-09-22 DIAGNOSIS — Z6841 Body Mass Index (BMI) 40.0 and over, adult: Secondary | ICD-10-CM

## 2015-09-22 LAB — CBC WITH DIFFERENTIAL/PLATELET
BASOS ABS: 0 10*3/uL (ref 0.0–0.1)
BASOS PCT: 0 %
Eosinophils Absolute: 0.1 10*3/uL (ref 0.0–0.7)
Eosinophils Relative: 2 %
HCT: 41.7 % (ref 36.0–46.0)
Hemoglobin: 12.2 g/dL (ref 12.0–15.0)
LYMPHS PCT: 32 %
Lymphs Abs: 1.1 10*3/uL (ref 0.7–4.0)
MCH: 29.1 pg (ref 26.0–34.0)
MCHC: 29.3 g/dL — ABNORMAL LOW (ref 30.0–36.0)
MCV: 99.5 fL (ref 78.0–100.0)
Monocytes Absolute: 0.4 10*3/uL (ref 0.1–1.0)
Monocytes Relative: 11 %
Neutro Abs: 1.9 10*3/uL (ref 1.7–7.7)
Neutrophils Relative %: 55 %
PLATELETS: 106 10*3/uL — AB (ref 150–400)
RBC: 4.19 MIL/uL (ref 3.87–5.11)
RDW: 15.3 % (ref 11.5–15.5)
WBC: 3.4 10*3/uL — AB (ref 4.0–10.5)

## 2015-09-22 LAB — COMPREHENSIVE METABOLIC PANEL
ALT: 8 U/L — ABNORMAL LOW (ref 14–54)
ANION GAP: 6 (ref 5–15)
AST: 12 U/L — ABNORMAL LOW (ref 15–41)
Albumin: 2.8 g/dL — ABNORMAL LOW (ref 3.5–5.0)
Alkaline Phosphatase: 53 U/L (ref 38–126)
BILIRUBIN TOTAL: 0.7 mg/dL (ref 0.3–1.2)
BUN: 23 mg/dL — AB (ref 6–20)
CALCIUM: 9 mg/dL (ref 8.9–10.3)
CO2: 29 mmol/L (ref 22–32)
Chloride: 106 mmol/L (ref 101–111)
Creatinine, Ser: 0.82 mg/dL (ref 0.44–1.00)
GFR calc Af Amer: >60 mL/min (ref >60)
Glucose, Bld: 196 mg/dL — ABNORMAL HIGH (ref 65–99)
POTASSIUM: 4.5 mmol/L (ref 3.5–5.1)
Sodium: 141 mmol/L (ref 135–145)
TOTAL PROTEIN: 6 g/dL — AB (ref 6.5–8.1)

## 2015-09-22 LAB — URINE MICROSCOPIC-ADD ON: Bacteria, UA: NONE SEEN

## 2015-09-22 LAB — URINALYSIS, ROUTINE W REFLEX MICROSCOPIC
BILIRUBIN URINE: NEGATIVE
Glucose, UA: NEGATIVE mg/dL
KETONES UR: NEGATIVE mg/dL
Leukocytes, UA: NEGATIVE
NITRITE: NEGATIVE
PROTEIN: NEGATIVE mg/dL
SPECIFIC GRAVITY, URINE: 1.016 (ref 1.005–1.030)
pH: 5.5 (ref 5.0–8.0)

## 2015-09-22 LAB — BRAIN NATRIURETIC PEPTIDE: B NATRIURETIC PEPTIDE 5: 173 pg/mL — AB (ref 0.0–100.0)

## 2015-09-22 LAB — TROPONIN I

## 2015-09-22 MED ORDER — SODIUM CHLORIDE 0.9% FLUSH: 3.0000 mL | INTRAVENOUS | Status: DC | PRN

## 2015-09-22 MED ORDER — IRBESARTAN 150 MG PO TABS: 150.0000 mg | ORAL_TABLET | Freq: Every day | ORAL | Status: DC

## 2015-09-22 MED ORDER — FUROSEMIDE 20 MG PO TABS: 20.0000 mg | ORAL_TABLET | Freq: Two times a day (BID) | ORAL | Status: DC

## 2015-09-22 MED ORDER — PANTOPRAZOLE SODIUM 40 MG PO TBEC
40.0000 mg | DELAYED_RELEASE_TABLET | Freq: Every day | ORAL | Status: DC
  Administered 2015-09-23 – 2015-09-28 (×6): 40 mg via ORAL
  Filled 2015-09-22 (×7): qty 1

## 2015-09-22 MED ORDER — INSULIN GLARGINE 100 UNIT/ML ~~LOC~~ SOLN
15.0000 [IU] | Freq: Every day | SUBCUTANEOUS | Status: DC
  Administered 2015-09-22 – 2015-09-27 (×6): 15 [IU] via SUBCUTANEOUS
  Filled 2015-09-22 (×8): qty 0.15

## 2015-09-22 MED ORDER — FUROSEMIDE 10 MG/ML IJ SOLN
40.0000 mg | Freq: Once | INTRAMUSCULAR | Status: AC
  Administered 2015-09-22: 40 mg via INTRAVENOUS
  Filled 2015-09-22: qty 4

## 2015-09-22 MED ORDER — IOPAMIDOL (ISOVUE-370) INJECTION 76%
INTRAVENOUS | Status: AC
  Administered 2015-09-22: 85 mL
  Filled 2015-09-22: qty 100

## 2015-09-22 MED ORDER — ALBUTEROL SULFATE (2.5 MG/3ML) 0.083% IN NEBU
3.0000 mL | INHALATION_SOLUTION | Freq: Four times a day (QID) | RESPIRATORY_TRACT | Status: DC | PRN
Start: 2015-09-22 — End: 2015-10-01

## 2015-09-22 MED ORDER — SODIUM CHLORIDE 0.9% FLUSH
3.0000 mL | Freq: Two times a day (BID) | INTRAVENOUS | Status: DC
  Administered 2015-09-22 – 2015-10-01 (×17): 3 mL via INTRAVENOUS

## 2015-09-22 MED ORDER — INSULIN ASPART 100 UNIT/ML ~~LOC~~ SOLN: 0.0000 [IU] | Freq: Three times a day (TID) | SUBCUTANEOUS | Status: DC

## 2015-09-22 MED ORDER — AMLODIPINE BESYLATE 5 MG PO TABS
5.0000 mg | ORAL_TABLET | Freq: Every day | ORAL | Status: DC
  Administered 2015-09-23: 5 mg via ORAL
  Filled 2015-09-22: qty 1

## 2015-09-22 MED ORDER — DOXEPIN HCL 25 MG PO CAPS
25.0000 mg | ORAL_CAPSULE | Freq: Every day | ORAL | Status: DC
Start: 2015-09-22 — End: 2015-09-25
  Administered 2015-09-22 – 2015-09-24 (×3): 25 mg via ORAL
  Filled 2015-09-22 (×3): qty 1

## 2015-09-22 MED ORDER — CARVEDILOL PHOSPHATE ER 40 MG PO CP24
40.0000 mg | ORAL_CAPSULE | Freq: Every day | ORAL | Status: DC
  Administered 2015-09-23 – 2015-09-25 (×3): 40 mg via ORAL
  Filled 2015-09-22 (×3): qty 1

## 2015-09-22 MED ORDER — ACETAMINOPHEN 325 MG PO TABS: 650.0000 mg | ORAL_TABLET | ORAL | Status: DC | PRN

## 2015-09-22 MED ORDER — LEVETIRACETAM 500 MG PO TABS
500.0000 mg | ORAL_TABLET | Freq: Two times a day (BID) | ORAL | Status: DC
  Administered 2015-09-22 – 2015-10-01 (×18): 500 mg via ORAL
  Filled 2015-09-22 (×18): qty 1

## 2015-09-22 MED ORDER — ONDANSETRON HCL 4 MG/2ML IJ SOLN: 4.0000 mg | Freq: Four times a day (QID) | INTRAMUSCULAR | Status: DC | PRN

## 2015-09-22 MED ORDER — PREGABALIN 75 MG PO CAPS
75.0000 mg | ORAL_CAPSULE | Freq: Two times a day (BID) | ORAL | Status: DC
  Administered 2015-09-22 – 2015-10-01 (×17): 75 mg via ORAL
  Filled 2015-09-22 (×18): qty 1

## 2015-09-22 MED ORDER — FERROUS SULFATE 325 (65 FE) MG PO TABS
325.0000 mg | ORAL_TABLET | Freq: Every day | ORAL | Status: DC
  Administered 2015-09-23 – 2015-10-01 (×9): 325 mg via ORAL
  Filled 2015-09-22 (×9): qty 1

## 2015-09-22 MED ORDER — HYDROXYZINE HCL 25 MG PO TABS: 25.0000 mg | ORAL_TABLET | Freq: Two times a day (BID) | ORAL | Status: DC | PRN

## 2015-09-22 MED ORDER — ALPRAZOLAM 0.25 MG PO TABS: 0.2500 mg | ORAL_TABLET | Freq: Two times a day (BID) | ORAL | Status: DC | PRN

## 2015-09-22 MED ORDER — APIXABAN 2.5 MG PO TABS
2.5000 mg | ORAL_TABLET | Freq: Two times a day (BID) | ORAL | Status: DC
Start: 2015-09-22 — End: 2015-09-23
  Administered 2015-09-22 – 2015-09-23 (×2): 2.5 mg via ORAL
  Filled 2015-09-22 (×2): qty 1

## 2015-09-22 MED ORDER — DICLOFENAC SODIUM 1 % TD GEL: 1.0000 "application " | Freq: Four times a day (QID) | TRANSDERMAL | Status: DC | PRN

## 2015-09-22 MED ORDER — GUAIFENESIN ER 600 MG PO TB12
1200.0000 mg | ORAL_TABLET | Freq: Two times a day (BID) | ORAL | Status: DC
  Administered 2015-09-22 – 2015-10-01 (×18): 1200 mg via ORAL
  Filled 2015-09-22 (×18): qty 2

## 2015-09-22 MED ORDER — IPRATROPIUM-ALBUTEROL 0.5-2.5 (3) MG/3ML IN SOLN: 3.0000 mL | Freq: Four times a day (QID) | RESPIRATORY_TRACT | Status: DC | PRN

## 2015-09-22 MED ORDER — SODIUM CHLORIDE 0.9 % IV SOLN: 250.0000 mL | INTRAVENOUS | Status: DC | PRN

## 2015-09-22 MED ORDER — FUROSEMIDE 20 MG PO TABS
20.0000 mg | ORAL_TABLET | Freq: Two times a day (BID) | ORAL | Status: DC
  Administered 2015-09-23: 20 mg via ORAL
  Filled 2015-09-22: qty 1

## 2015-09-22 MED ORDER — ROPINIROLE HCL 1 MG PO TABS
2.0000 mg | ORAL_TABLET | Freq: Four times a day (QID) | ORAL | Status: DC
  Administered 2015-09-22 – 2015-09-26 (×14): 2 mg via ORAL
  Filled 2015-09-22 (×19): qty 2

## 2015-09-22 NOTE — H&P (Signed)
History and Physical    Elizabeth Pugh [id number] DOB: 1926-07-16 DOA: 09/22/2015   PCP: Philis Fendt, MD Chief Complaint:  Chief Complaint  Patient presents with  . Leg Swelling  . Arm Swelling    HPI: Elizabeth Pugh is a 82 y.o. female with medical history significant of Chronic diastolic CHF, DM, HTN.  Patient presents to the ED with BLE edema that has been slowly worsening since discharge from the hospital on the first of the month.  Patient was taken off of her lasix at the time of admission to the hospital and it was not restarted due to pre-renal azotemia at that time.  She has associated SOB.  80% O2 on room air, 94% on 2L.  Patient treated for bronchitis and PNA 3 weeks ago.  ED Course: Found to have mild CHF and pulmonary edema on imaging studies in ED.  Review of Systems: As per HPI otherwise 10 point review of systems negative.    Past Medical History  Diagnosis Date  . Stroke ([place])   . GERD (gastroesophageal reflux disease)   . Hypertension   . Diabetes mellitus   . Blood transfusion   . Arthritis   . Anxiety   . Anemia   . GI (gastrointestinal bleed)     Due to benign mass  . Bronchitis   . Restless leg syndrome   . Atrial fibrillation ([place])   . Acute delirium 04/12/2011  . Seizures ([place])   . CHF (congestive heart failure) ([place])   . Acute kidney injury ([place]) 09/02/2015  . Lobar pneumonia due to unspecified organism 09/02/2015    Past Surgical History  Procedure Laterality Date  . Eye surgery      Cataract removal  . Abdominal hysterectomy    . Tubal ligation    . Eus  09/24/2011    Procedure: UPPER ENDOSCOPIC ULTRASOUND (EUS) LINEAR;  Surgeon: Beryle Beams, MD;  Location: [facility];  Service: Endoscopy;  Laterality: N/A;     reports that she quit smoking about 30 years ago. Her smoking use included Cigarettes. She has never used smokeless tobacco. She reports that she does not drink alcohol or use illicit drugs.  Allergies  Allergen  Reactions  . Erythromycin Other (See Comments)    Vaginal itching  . Penicillins Rash    Has patient had a PCN reaction causing immediate rash, facial/tongue/throat swelling, SOB or lightheadedness with hypotension: No Has patient had a PCN reaction causing severe rash involving mucus membranes or skin necrosis: No Has patient had a PCN reaction that required hospitalization: No Has patient had a PCN reaction occurring within the last 10 years: No If all of the above answers are "NO", then may proceed with Cephalosporin use.  Marland Kitchen Zithromax [Azithromycin] Other (See Comments)    gave her a yeast infection.    Family History  Problem Relation Age of Onset  . Aneurysm Mother   . Heart attack Father   . Heart failure Sister   . Asthma Sister   . Kidney failure Sister   . Stroke Sister      Prior to Admission medications   Medication Sig Start Date End Date Taking? Authorizing Provider  albuterol (PROVENTIL HFA;VENTOLIN HFA) 108 (90 BASE) MCG/ACT inhaler Inhale 1 puff into the lungs every 6 (six) hours as needed for wheezing or shortness of breath.   Yes Historical Provider, MD  ALPRAZolam (XANAX) 0.25 MG tablet Take 1 tablet (0.25 mg total) by mouth 2 (two) times daily  as needed for anxiety. 01/28/15  Yes Jessica U Vann, DO  amLODipine (NORVASC) 5 MG tablet Take 5 mg by mouth daily. 08/09/15  Yes Historical Provider, MD  calcium-vitamin D (CALCIUM 500+D) 500-400 MG-UNIT per tablet Take 1 tablet by mouth 2 (two) times daily.   Yes Historical Provider, MD  carvedilol (COREG CR) 20 MG 24 hr capsule Take 1 capsule (20 mg total) by mouth daily. Patient taking differently: Take 40 mg by mouth daily.  01/25/15  Yes [name], MD  doxepin (SINEQUAN) 25 MG capsule Take 25 mg by mouth at bedtime.   Yes Historical Provider, MD  ELIQUIS 2.5 MG TABS tablet Take 2.5 mg by mouth 2 (two) times daily. 08/14/15  Yes Historical Provider, MD  ferrous sulfate 325 (65 FE) MG tablet Take 325 mg by mouth daily  with breakfast.   Yes Historical Provider, MD  glimepiride (AMARYL) 2 MG tablet Take 2 mg by mouth daily before breakfast.   Yes Historical Provider, MD  guaiFENesin (MUCINEX) 600 MG 12 hr tablet Take 2 tablets (1,200 mg total) by mouth 2 (two) times daily. 09/02/15  Yes Nishant Dhungel, MD  hydrOXYzine (ATARAX/VISTARIL) 25 MG tablet Take 25 mg by mouth 2 (two) times daily as needed for itching.    Yes Historical Provider, MD  insulin aspart (NOVOLOG) 100 UNIT/ML injection Inject 0-12 Units into the skin 3 (three) times daily before meals. Under 150 = 0 units, 150 - 200 = 2 units, 201- 250 = 4 units, 251- 300 = 6 units, 301- 350 = 8 units, 351-400 = 10 units, 401-450 = 12  units   Yes Historical Provider, MD  insulin glargine (LANTUS) 100 UNIT/ML injection Inject 0.2 mLs (20 Units total) into the skin at bedtime. 01/28/15  Yes Jessica U Vann, DO  ipratropium-albuterol (DUONEB) 0.5-2.5 (3) MG/3ML SOLN Take 3 mLs by nebulization every 6 (six) hours as needed. Patient taking differently: Take 3 mLs by nebulization every 6 (six) hours as needed (FOR SOB).  01/25/15  Yes [name], MD  levETIRAcetam (KEPPRA) 500 MG tablet Take 1 tablet (500 mg total) by mouth 2 (two) times daily. 02/27/12  Yes Monika Salk, MD  pantoprazole (PROTONIX) 40 MG tablet Take 1 tablet (40 mg total) by mouth daily at 6 (six) AM. 01/28/15  Yes Geradine Girt, DO  pregabalin (LYRICA) 75 MG capsule Take 75 mg by mouth 2 (two) times daily.   Yes Historical Provider, MD  rOPINIRole (REQUIP) 2 MG tablet Take 2 mg by mouth 4 (four) times daily.    Yes Historical Provider, MD  VOLTAREN 1 % GEL Apply 1 application topically 4 (four) times daily as needed (knee pain). For pain 10/26/11  Yes Historical Provider, MD  furosemide (LASIX) 20 MG tablet Take 20 mg by mouth 2 (two) times daily. 08/23/15   Historical Provider, MD  telmisartan (MICARDIS) 40 MG tablet Take 40 mg by mouth 2 (two) times daily. 07/07/15   Historical Provider, MD    Physical  Exam: Filed Vitals:   09/22/15 1918 09/22/15 1945 09/22/15 2000 09/22/15 2015  BP: 136/85 119/68 116/79 135/89  Pulse: 72 83 89 95  Temp:      TempSrc:      Resp: 17 16 23 15   SpO2: 99% 96% 93% 97%      Constitutional: NAD, calm, comfortable Eyes: PERRL, lids and conjunctivae normal ENMT: Mucous membranes are moist. Posterior pharynx clear of any exudate or lesions.Normal dentition.  Neck: normal, supple, no masses,  no thyromegaly Respiratory: clear to auscultation bilaterally, no wheezing, no crackles. Normal respiratory effort. No accessory muscle use.  Cardiovascular: Regular rate and rhythm, no murmurs / rubs / gallops. Edema everywhere up to and including patients face. 2+ pedal pulses. No carotid bruits.  Abdomen: no tenderness, no masses palpated. No hepatosplenomegaly. Bowel sounds positive.  Musculoskeletal: no clubbing / cyanosis. No joint deformity upper and lower extremities. Good ROM, no contractures. Normal muscle tone.  Skin: no rashes, lesions, ulcers. No induration Neurologic: CN 2-12 grossly intact. Sensation intact, DTR normal. Strength 5/5 in all 4.  Psychiatric: Normal judgment and insight. Alert and oriented x 3. Normal mood.    Labs on Admission: I have personally reviewed following labs and imaging studies  CBC:  Recent Labs Lab 09/22/15 1513  WBC 3.4*  NEUTROABS 1.9  HGB 12.2  HCT 41.7  MCV 99.5  PLT [id number]*   Basic Metabolic Panel:  Recent Labs Lab 09/22/15 1513  NA 141  K 4.5  CL 106  CO2 29  GLUCOSE 196*  BUN 23*  CREATININE 0.82  CALCIUM 9.0   GFR: CrCl cannot be calculated (Unknown ideal weight.). Liver Function Tests:  Recent Labs Lab 09/22/15 1513  AST 12*  ALT 8*  ALKPHOS 53  BILITOT 0.7  PROT 6.0*  ALBUMIN 2.8*   No results for input(s): LIPASE, AMYLASE in the last 168 hours. No results for input(s): AMMONIA in the last 168 hours. Coagulation Profile: No results for input(s): INR, PROTIME in the last 168  hours. Cardiac Enzymes:  Recent Labs Lab 09/22/15 1513  TROPONINI <0.03   BNP (last 3 results) No results for input(s): PROBNP in the last 8760 hours. HbA1C: No results for input(s): HGBA1C in the last 72 hours. CBG: No results for input(s): GLUCAP in the last 168 hours. Lipid Profile: No results for input(s): CHOL, HDL, LDLCALC, TRIG, CHOLHDL, LDLDIRECT in the last 72 hours. Thyroid Function Tests: No results for input(s): TSH, T4TOTAL, FREET4, T3FREE, THYROIDAB in the last 72 hours. Anemia Panel: No results for input(s): VITAMINB12, FOLATE, FERRITIN, TIBC, IRON, RETICCTPCT in the last 72 hours. Urine analysis:    Component Value Date/Time   COLORURINE YELLOW 08/29/2015 [place] 08/29/2015 1156   LABSPEC 1.023 08/29/2015 1156   PHURINE 6.0 08/29/2015 1156   GLUCOSEU 100* 08/29/2015 1156   HGBUR NEGATIVE 08/29/2015 1156   BILIRUBINUR NEGATIVE 08/29/2015 1156   KETONESUR NEGATIVE 08/29/2015 1156   PROTEINUR NEGATIVE 08/29/2015 1156   UROBILINOGEN 1.0 01/19/2015 2241   NITRITE NEGATIVE 08/29/2015 1156   LEUKOCYTESUR NEGATIVE 08/29/2015 1156   Sepsis Labs: @LABRCNTIP (procalcitonin:4,lacticidven:4) )No results found for this or any previous visit (from the past 240 hour(s)).   Radiological Exams on Admission: Dg Chest 2 View  09/22/2015  CLINICAL DATA:  82 year old with acute onset of shortness of breath yesterday. Current history of hypertension, diabetes and CHF. Former smoker. EXAM: CHEST  2 VIEW COMPARISON:  08/29/2015 and earlier, including CTA chest 01/27/2015. FINDINGS: AP semi-erect and lateral images were obtained. Cardiac silhouette markedly enlarged, unchanged. Thoracic aorta tortuous, unchanged. Hilar and mediastinal contours otherwise unremarkable. Loculated pleural effusion posteriorly on the right with extension into the major fissure, markedly increased in size since September, 2016 where it was not visible on the x-ray but was present on the CT.  No visible left pleural effusion. Lungs otherwise clear. Mild pulmonary venous hypertension without overt edema. IMPRESSION: Large loculated right pleural effusion with extension into the major fissure, increased in size since September, 2016. Stable  cardiomegaly without overt edema. Electronically Signed   By: Evangeline Dakin M.D.   On: 09/22/2015 16:01   Ct Angio Chest Pe W/cm &/or Wo Cm  09/22/2015  CLINICAL DATA:  82 year old female with shortness of breath and right-sided pleural effusion EXAM: CT ANGIOGRAPHY CHEST WITH CONTRAST TECHNIQUE: Multidetector CT imaging of the chest was performed using the standard protocol during bolus administration of intravenous contrast. Multiplanar CT image reconstructions and MIPs were obtained to evaluate the vascular anatomy. CONTRAST:  85 mL Isovue 370 COMPARISON:  Chest x-ray obtained concurrently; most recent chest CT 01/27/2015 FINDINGS: Mediastinum: Unremarkable CT appearance of the thyroid gland. No suspicious mediastinal or hilar adenopathy. No soft tissue mediastinal mass. The thoracic esophagus is unremarkable. Heart/Vascular: Adequate opacification of the pulmonary arteries to the proximal subsegmental level. No evidence of acute pulmonary embolus. Conventional 3 vessel arch anatomy. No aneurysm. Cardiomegaly. Right heart enlargement. No pericardial effusion. Atherosclerotic calcifications along the coronary arteries. Lungs/Pleura: Small right and trace left layering pleural effusions. Mild interlobular septal thickening and diffuse mild ground-glass attenuation airspace opacity most consistent with mild interstitial edema. Passive atelectasis in the lower lobes. Mild bronchial wall thickening diffusely. Bones/Soft Tissues: No acute fracture or aggressive appearing lytic or blastic osseous lesion. Upper Abdomen: Stable gastric antral lipoma. Visualized upper abdominal organs are unremarkable. Review of the MIP images confirms the above findings. IMPRESSION: 1.  Negative for acute pulmonary embolus. 2. Mild pulmonary edema. In the setting of cardiomegaly this is concerning for mild congestive heart failure. 3. Small right and trace left layering pleural effusions with associated bibasilar atelectasis. 4. Atherosclerosis including coronary artery calcifications. Electronically Signed   By: Jacqulynn Cadet M.D.   On: 09/22/2015 19:26    EKG: Independently reviewed.  Assessment/Plan Principal Problem:   Acute on chronic diastolic (congestive) heart failure (HCC) Active Problems:   DM (diabetes mellitus), type 2, uncontrolled with complications (HCC)   Hypertension  Acute on chronic diastolic CHF -  CHF pathway  Lasix 40mg  IV in ED x1  Resuming lasix 20mg  BID PO that she was taking prior to last admission   DM2 -  Lantus 15 units QHS  Moderate dose SSI AC/HS  HTN - Continue home BP meds  Chronic debility and bed-bound status - getting CM consult.   DVT prophylaxis: Eliquis Code Status: DNR Family Communication: Family at bedside Consults called: None Admission status: Admit to obs   [place], [facility] Pager [phone] from 7PM-7AM  If 7AM-7PM, please contact the day physician for the patient [email or web address] Password [email or web address]  09/22/2015, 9:07 PM

## 2015-09-22 NOTE — ED Notes (Signed)
PA Jessica at the bedside

## 2015-09-22 NOTE — ED Notes (Signed)
Pt gone from room at this time. Secretary reports patient being taken to vascular

## 2015-09-22 NOTE — ED Provider Notes (Signed)
CSN: [id number]     Arrival date & time 09/22/15  1415 History   First MD Initiated Contact with Patient 09/22/15 1504     Chief Complaint  Patient presents with  . Leg Swelling  . Arm Swelling     (Consider location/radiation/quality/duration/timing/severity/associated sxs/prior Treatment) The history is provided by a relative.   History provided by daughter. Patient is a 82 year old female with a history of CHF, DM type II, stroke, HTN, bed bound who presents to ER with increased edema to LUE, bilateral lower extremities for 1 day. Associated SOB and mild intermittent productive cough. Patient does not use oxygen at home. Patient O2 sat was 80% on room air, 94% on 2 L. Patient was treated for bronchitis and possible pneumonia roughly 3 weeks ago. Denies chest pain, headache, dizziness, nausea, vomiting, visual changes, fever, chills. Pt was treated for pneumonia roughly 3 weeks ago and at that time the daughter states they took the pt off of her lasix.  Past Medical History  Diagnosis Date  . Stroke ([place])   . GERD (gastroesophageal reflux disease)   . Hypertension   . Diabetes mellitus   . Blood transfusion   . Arthritis   . Anxiety   . Anemia   . GI (gastrointestinal bleed)     Due to benign mass  . Bronchitis   . Restless leg syndrome   . Atrial fibrillation ([place])   . Acute delirium 04/12/2011  . Seizures ([place])   . CHF (congestive heart failure) ([place])   . Acute kidney injury ([place]) 09/02/2015  . Lobar pneumonia due to unspecified organism 09/02/2015   Past Surgical History  Procedure Laterality Date  . Eye surgery      Cataract removal  . Abdominal hysterectomy    . Tubal ligation    . Eus  09/24/2011    Procedure: UPPER ENDOSCOPIC ULTRASOUND (EUS) LINEAR;  Surgeon: Beryle Beams, MD;  Location: [facility];  Service: Endoscopy;  Laterality: N/A;   Family History  Problem Relation Age of Onset  . Aneurysm Mother   . Heart attack Father   . Heart failure Sister   .  Asthma Sister   . Kidney failure Sister   . Stroke Sister    Social History  Substance Use Topics  . Smoking status: Former Smoker    Types: Cigarettes    Quit date: 09/21/1985  . Smokeless tobacco: Never Used  . Alcohol Use: No   OB History    No data available     Review of Systems  Constitutional: Negative for fever and chills.  HENT: Negative for sore throat and trouble swallowing.   Eyes: Negative for visual disturbance.  Respiratory: Positive for cough and shortness of breath. Negative for chest tightness and wheezing.   Cardiovascular: Positive for leg swelling. Negative for chest pain.  Gastrointestinal: Negative for nausea, vomiting, abdominal pain and diarrhea.  Genitourinary: Negative for dysuria and hematuria.  Musculoskeletal: Negative for back pain and neck pain.  Skin: Negative for rash.  Neurological: Negative for dizziness, syncope, numbness and headaches.  Hematological: Bruises/bleeds easily.  Psychiatric/Behavioral: Negative for confusion and agitation.      Allergies  Erythromycin; Penicillins; and Zithromax  Home Medications   Prior to Admission medications   Medication Sig Start Date End Date Taking? Authorizing Provider  albuterol (PROVENTIL HFA;VENTOLIN HFA) 108 (90 BASE) MCG/ACT inhaler Inhale 1 puff into the lungs every 6 (six) hours as needed for wheezing or shortness of breath.   Yes Historical Provider, MD  ALPRAZolam (XANAX) 0.25 MG tablet Take 1 tablet (0.25 mg total) by mouth 2 (two) times daily as needed for anxiety. 01/28/15  Yes Tasnia Spegal U Vann, DO  amLODipine (NORVASC) 5 MG tablet Take 5 mg by mouth daily. 08/09/15  Yes Historical Provider, MD  calcium-vitamin D (CALCIUM 500+D) 500-400 MG-UNIT per tablet Take 1 tablet by mouth 2 (two) times daily.   Yes Historical Provider, MD  carvedilol (COREG CR) 20 MG 24 hr capsule Take 1 capsule (20 mg total) by mouth daily. Patient taking differently: Take 40 mg by mouth daily.  01/25/15  Yes [name], MD  doxepin (SINEQUAN) 25 MG capsule Take 25 mg by mouth at bedtime.   Yes Historical Provider, MD  ELIQUIS 2.5 MG TABS tablet Take 2.5 mg by mouth 2 (two) times daily. 08/14/15  Yes Historical Provider, MD  ferrous sulfate 325 (65 FE) MG tablet Take 325 mg by mouth daily with breakfast.   Yes Historical Provider, MD  glimepiride (AMARYL) 2 MG tablet Take 2 mg by mouth daily before breakfast.   Yes Historical Provider, MD  guaiFENesin (MUCINEX) 600 MG 12 hr tablet Take 2 tablets (1,200 mg total) by mouth 2 (two) times daily. 09/02/15  Yes Nishant Dhungel, MD  hydrOXYzine (ATARAX/VISTARIL) 25 MG tablet Take 25 mg by mouth 2 (two) times daily as needed for itching.    Yes Historical Provider, MD  insulin aspart (NOVOLOG) 100 UNIT/ML injection Inject 0-12 Units into the skin 3 (three) times daily before meals. Under 150 = 0 units, 150 - 200 = 2 units, 201- 250 = 4 units, 251- 300 = 6 units, 301- 350 = 8 units, 351-400 = 10 units, 401-450 = 12  units   Yes Historical Provider, MD  insulin glargine (LANTUS) 100 UNIT/ML injection Inject 0.2 mLs (20 Units total) into the skin at bedtime. 01/28/15  Yes Lashina Milles U Vann, DO  ipratropium-albuterol (DUONEB) 0.5-2.5 (3) MG/3ML SOLN Take 3 mLs by nebulization every 6 (six) hours as needed. Patient taking differently: Take 3 mLs by nebulization every 6 (six) hours as needed (FOR SOB).  01/25/15  Yes [name], MD  levETIRAcetam (KEPPRA) 500 MG tablet Take 1 tablet (500 mg total) by mouth 2 (two) times daily. 02/27/12  Yes Monika Salk, MD  pantoprazole (PROTONIX) 40 MG tablet Take 1 tablet (40 mg total) by mouth daily at 6 (six) AM. 01/28/15  Yes Geradine Girt, DO  pregabalin (LYRICA) 75 MG capsule Take 75 mg by mouth 2 (two) times daily.   Yes Historical Provider, MD  rOPINIRole (REQUIP) 2 MG tablet Take 2 mg by mouth 4 (four) times daily.    Yes Historical Provider, MD  VOLTAREN 1 % GEL Apply 1 application topically 4 (four) times daily as needed (knee pain).  For pain 10/26/11  Yes Historical Provider, MD  furosemide (LASIX) 20 MG tablet Take 20 mg by mouth 2 (two) times daily. 08/23/15   Historical Provider, MD  telmisartan (MICARDIS) 40 MG tablet Take 40 mg by mouth 2 (two) times daily. 07/07/15   Historical Provider, MD   BP 102/63 mmHg  Pulse 88  Temp(Src) 98.5 F (36.9 C) (Oral)  Resp 18  SpO2 98% Physical Exam  Constitutional: She appears well-developed and well-nourished. No distress.  HENT:  Head: Normocephalic and atraumatic.  Eyes: Conjunctivae are normal.  Neck: No tracheal deviation present.  Cardiovascular: Normal rate and normal heart sounds.  An irregular rhythm present.  Pulses:      Radial  pulses are 2+ on the right side, and 2+ on the left side.       Posterior tibial pulses are 2+ on the right side, and 1+ on the left side.  Pulmonary/Chest: Effort normal. No accessory muscle usage. No respiratory distress.  Patient unable to sit up unable to assess breath sounds.  Abdominal: Normal appearance and bowel sounds are normal. She exhibits distension. She exhibits no fluid wave. There is tenderness in the right upper quadrant. There is no rigidity and no guarding.  Musculoskeletal:  3+ pitting edema to left lower extremity, 2+ pitting edema to right lower extremity, 2+ pitting edema to right forearm and hand.   Neurological: She is alert. Coordination normal.  Skin: Skin is warm and dry. No rash noted. She is not diaphoretic.  Psychiatric: She has a normal mood and affect. Her behavior is normal.  Nursing note and vitals reviewed.   ED Course  Procedures (including critical care time) Labs Review Labs Reviewed  COMPREHENSIVE METABOLIC PANEL - Abnormal; Notable for the following:    Glucose, Bld 196 (*)    BUN 23 (*)    Total Protein 6.0 (*)    Albumin 2.8 (*)    AST 12 (*)    ALT 8 (*)    All other components within normal limits  CBC WITH DIFFERENTIAL/PLATELET - Abnormal; Notable for the following:    WBC 3.4 (*)     MCHC 29.3 (*)    Platelets 106 (*)    All other components within normal limits  BRAIN NATRIURETIC PEPTIDE - Abnormal; Notable for the following:    B Natriuretic Peptide 173.0 (*)    All other components within normal limits  URINALYSIS, ROUTINE W REFLEX MICROSCOPIC (NOT AT [facility]) - Abnormal; Notable for the following:    Color, Urine STRAW (*)    Hgb urine dipstick SMALL (*)    All other components within normal limits  URINE MICROSCOPIC-ADD ON - Abnormal; Notable for the following:    Squamous Epithelial / LPF 0-5 (*)    All other components within normal limits  TROPONIN I  BASIC METABOLIC PANEL    Imaging Review Dg Chest 2 View  09/22/2015  CLINICAL DATA:  82 year old with acute onset of shortness of breath yesterday. Current history of hypertension, diabetes and CHF. Former smoker. EXAM: CHEST  2 VIEW COMPARISON:  08/29/2015 and earlier, including CTA chest 01/27/2015. FINDINGS: AP semi-erect and lateral images were obtained. Cardiac silhouette markedly enlarged, unchanged. Thoracic aorta tortuous, unchanged. Hilar and mediastinal contours otherwise unremarkable. Loculated pleural effusion posteriorly on the right with extension into the major fissure, markedly increased in size since September, 2016 where it was not visible on the x-ray but was present on the CT. No visible left pleural effusion. Lungs otherwise clear. Mild pulmonary venous hypertension without overt edema. IMPRESSION: Large loculated right pleural effusion with extension into the major fissure, increased in size since September, 2016. Stable cardiomegaly without overt edema. Electronically Signed   By: Evangeline Dakin M.D.   On: 09/22/2015 16:01   Ct Angio Chest Pe W/cm &/or Wo Cm  09/22/2015  CLINICAL DATA:  82 year old female with shortness of breath and right-sided pleural effusion EXAM: CT ANGIOGRAPHY CHEST WITH CONTRAST TECHNIQUE: Multidetector CT imaging of the chest was performed using the standard protocol  during bolus administration of intravenous contrast. Multiplanar CT image reconstructions and MIPs were obtained to evaluate the vascular anatomy. CONTRAST:  85 mL Isovue 370 COMPARISON:  Chest x-ray obtained concurrently; most recent chest  CT 01/27/2015 FINDINGS: Mediastinum: Unremarkable CT appearance of the thyroid gland. No suspicious mediastinal or hilar adenopathy. No soft tissue mediastinal mass. The thoracic esophagus is unremarkable. Heart/Vascular: Adequate opacification of the pulmonary arteries to the proximal subsegmental level. No evidence of acute pulmonary embolus. Conventional 3 vessel arch anatomy. No aneurysm. Cardiomegaly. Right heart enlargement. No pericardial effusion. Atherosclerotic calcifications along the coronary arteries. Lungs/Pleura: Small right and trace left layering pleural effusions. Mild interlobular septal thickening and diffuse mild ground-glass attenuation airspace opacity most consistent with mild interstitial edema. Passive atelectasis in the lower lobes. Mild bronchial wall thickening diffusely. Bones/Soft Tissues: No acute fracture or aggressive appearing lytic or blastic osseous lesion. Upper Abdomen: Stable gastric antral lipoma. Visualized upper abdominal organs are unremarkable. Review of the MIP images confirms the above findings. IMPRESSION: 1. Negative for acute pulmonary embolus. 2. Mild pulmonary edema. In the setting of cardiomegaly this is concerning for mild congestive heart failure. 3. Small right and trace left layering pleural effusions with associated bibasilar atelectasis. 4. Atherosclerosis including coronary artery calcifications. Electronically Signed   By: Jacqulynn Cadet M.D.   On: 09/22/2015 19:26   I have personally reviewed and evaluated these images and lab results as part of my medical decision-making.   EKG Interpretation   Date/Time:  Sunday Sep 22 2015 14:38:34 EDT Ventricular Rate:  89 PR Interval:    QRS Duration: 102 QT  Interval:  418 QTC Calculation: 509 R Axis:   11 Text Interpretation:  Atrial fibrillation Low voltage, precordial leads  Repol abnrm, probable ischemia, inferior lds Baseline wander in lead(s) I  III aVL aVF No significant change since last tracing Confirmed by YAO  MD,  DAVID ([id number]) on 09/22/2015 4:01:44 PM      MDM   Final diagnoses:  Acute on chronic congestive heart failure, unspecified congestive heart failure type (HCC)    Afebrile, patient with history of CHF, worsening edema, new oxygen requirement, slightly elevated BNP concerning for mild exacerbation of CHF. Dr. Darl Householder will consult the hospitalist team for admission.   Hospitalist team will admit patient for further evaluation and treatment.    Kalman Drape, PA 09/23/15 0117  Wandra Arthurs, MD 09/23/15 [phone]

## 2015-09-22 NOTE — ED Notes (Signed)
Per EMS, Pt is coming from home where she stays with her daughter. Pt reports have sudden onset of edema to the left arm and bilateral legs starting yesterday. Pt has HX of CHF, [place], High Cholesterol. Reports feeling short of breath. VS per EMS: 150/84, 90 HR, 226 CBG, 94% on 2L, 80% on RA with no Hx of Chronic o2.

## 2015-09-22 NOTE — ED Notes (Signed)
Pt returned from X-ray.  

## 2015-09-22 NOTE — Progress Notes (Signed)
VASCULAR LAB PRELIMINARY  PRELIMINARY  PRELIMINARY  PRELIMINARY  Left upper extremity venous duplex completed.    Preliminary report:  There is no obvious evidence of DVT or SVT noted in the veins of the left upper extremity.  Elizabeth Pugh, RVT 09/22/2015, 5:27 PM

## 2015-09-22 NOTE — ED Notes (Signed)
Attempted IV. Unable to access at this time.

## 2015-09-22 NOTE — ED Notes (Signed)
Attempted to call report

## 2015-09-22 NOTE — ED Notes (Signed)
Phlebotomy at the bedside  

## 2015-09-23 DIAGNOSIS — E8809 Other disorders of plasma-protein metabolism, not elsewhere classified: Secondary | ICD-10-CM | POA: Diagnosis present

## 2015-09-23 DIAGNOSIS — Z87891 Personal history of nicotine dependence: Secondary | ICD-10-CM | POA: Diagnosis not present

## 2015-09-23 DIAGNOSIS — N3281 Overactive bladder: Secondary | ICD-10-CM | POA: Diagnosis present

## 2015-09-23 DIAGNOSIS — Z6841 Body Mass Index (BMI) 40.0 and over, adult: Secondary | ICD-10-CM | POA: Diagnosis not present

## 2015-09-23 DIAGNOSIS — Z881 Allergy status to other antibiotic agents status: Secondary | ICD-10-CM | POA: Diagnosis not present

## 2015-09-23 DIAGNOSIS — J449 Chronic obstructive pulmonary disease, unspecified: Secondary | ICD-10-CM | POA: Diagnosis present

## 2015-09-23 DIAGNOSIS — Z7901 Long term (current) use of anticoagulants: Secondary | ICD-10-CM | POA: Diagnosis not present

## 2015-09-23 DIAGNOSIS — Z823 Family history of stroke: Secondary | ICD-10-CM | POA: Diagnosis not present

## 2015-09-23 DIAGNOSIS — I482 Chronic atrial fibrillation: Secondary | ICD-10-CM | POA: Diagnosis not present

## 2015-09-23 DIAGNOSIS — I10 Essential (primary) hypertension: Secondary | ICD-10-CM | POA: Diagnosis not present

## 2015-09-23 DIAGNOSIS — I5033 Acute on chronic diastolic (congestive) heart failure: Secondary | ICD-10-CM | POA: Diagnosis not present

## 2015-09-23 DIAGNOSIS — Z880 Allergy status to penicillin: Secondary | ICD-10-CM | POA: Diagnosis not present

## 2015-09-23 DIAGNOSIS — E118 Type 2 diabetes mellitus with unspecified complications: Secondary | ICD-10-CM | POA: Diagnosis not present

## 2015-09-23 DIAGNOSIS — G9340 Encephalopathy, unspecified: Secondary | ICD-10-CM | POA: Diagnosis not present

## 2015-09-23 DIAGNOSIS — K219 Gastro-esophageal reflux disease without esophagitis: Secondary | ICD-10-CM | POA: Diagnosis present

## 2015-09-23 DIAGNOSIS — I110 Hypertensive heart disease with heart failure: Secondary | ICD-10-CM | POA: Diagnosis present

## 2015-09-23 DIAGNOSIS — Z794 Long term (current) use of insulin: Secondary | ICD-10-CM | POA: Diagnosis not present

## 2015-09-23 DIAGNOSIS — G9349 Other encephalopathy: Secondary | ICD-10-CM | POA: Diagnosis present

## 2015-09-23 DIAGNOSIS — E785 Hyperlipidemia, unspecified: Secondary | ICD-10-CM | POA: Diagnosis present

## 2015-09-23 DIAGNOSIS — I509 Heart failure, unspecified: Secondary | ICD-10-CM | POA: Diagnosis present

## 2015-09-23 DIAGNOSIS — G40909 Epilepsy, unspecified, not intractable, without status epilepticus: Secondary | ICD-10-CM | POA: Diagnosis present

## 2015-09-23 DIAGNOSIS — I69354 Hemiplegia and hemiparesis following cerebral infarction affecting left non-dominant side: Secondary | ICD-10-CM | POA: Diagnosis not present

## 2015-09-23 DIAGNOSIS — J9612 Chronic respiratory failure with hypercapnia: Secondary | ICD-10-CM | POA: Diagnosis present

## 2015-09-23 DIAGNOSIS — G4733 Obstructive sleep apnea (adult) (pediatric): Secondary | ICD-10-CM | POA: Diagnosis not present

## 2015-09-23 DIAGNOSIS — J9611 Chronic respiratory failure with hypoxia: Secondary | ICD-10-CM | POA: Diagnosis present

## 2015-09-23 DIAGNOSIS — Z883 Allergy status to other anti-infective agents status: Secondary | ICD-10-CM | POA: Diagnosis not present

## 2015-09-23 DIAGNOSIS — E662 Morbid (severe) obesity with alveolar hypoventilation: Secondary | ICD-10-CM | POA: Diagnosis present

## 2015-09-23 DIAGNOSIS — Z7401 Bed confinement status: Secondary | ICD-10-CM | POA: Diagnosis not present

## 2015-09-23 DIAGNOSIS — E1140 Type 2 diabetes mellitus with diabetic neuropathy, unspecified: Secondary | ICD-10-CM | POA: Diagnosis present

## 2015-09-23 DIAGNOSIS — Z66 Do not resuscitate: Secondary | ICD-10-CM | POA: Diagnosis present

## 2015-09-23 DIAGNOSIS — E1165 Type 2 diabetes mellitus with hyperglycemia: Secondary | ICD-10-CM | POA: Diagnosis not present

## 2015-09-23 DIAGNOSIS — G2581 Restless legs syndrome: Secondary | ICD-10-CM | POA: Diagnosis present

## 2015-09-23 DIAGNOSIS — F419 Anxiety disorder, unspecified: Secondary | ICD-10-CM | POA: Diagnosis present

## 2015-09-23 LAB — GLUCOSE, CAPILLARY
GLUCOSE-CAPILLARY: 143 mg/dL — AB (ref 65–99)
GLUCOSE-CAPILLARY: 154 mg/dL — AB (ref 65–99)
Glucose-Capillary: 158 mg/dL — ABNORMAL HIGH (ref 65–99)
Glucose-Capillary: 150 mg/dL — ABNORMAL HIGH (ref 65–99)

## 2015-09-23 LAB — BASIC METABOLIC PANEL
ANION GAP: 5 (ref 5–15)
BUN: 20 mg/dL (ref 6–20)
CALCIUM: 8.9 mg/dL (ref 8.9–10.3)
CO2: 34 mmol/L — ABNORMAL HIGH (ref 22–32)
Chloride: 102 mmol/L (ref 101–111)
Creatinine, Ser: 0.79 mg/dL (ref 0.44–1.00)
GFR calc Af Amer: >60 mL/min (ref >60)
GLUCOSE: 178 mg/dL — AB (ref 65–99)
Potassium: 4.3 mmol/L (ref 3.5–5.1)
SODIUM: 141 mmol/L (ref 135–145)

## 2015-09-23 MED ORDER — INSULIN ASPART 100 UNIT/ML ~~LOC~~ SOLN
0.0000 [IU] | Freq: Three times a day (TID) | SUBCUTANEOUS | Status: DC
  Administered 2015-09-23: 3 [IU] via SUBCUTANEOUS
  Administered 2015-09-23: 2 [IU] via SUBCUTANEOUS
  Administered 2015-09-23 – 2015-09-24 (×2): 3 [IU] via SUBCUTANEOUS
  Administered 2015-09-24: 5 [IU] via SUBCUTANEOUS
  Administered 2015-09-24 – 2015-09-26 (×7): 3 [IU] via SUBCUTANEOUS
  Administered 2015-09-27: 5 [IU] via SUBCUTANEOUS
  Administered 2015-09-27: 8 [IU] via SUBCUTANEOUS
  Administered 2015-09-28: 3 [IU] via SUBCUTANEOUS
  Administered 2015-09-28 (×2): 5 [IU] via SUBCUTANEOUS
  Administered 2015-09-29: 3 [IU] via SUBCUTANEOUS
  Administered 2015-09-29 (×2): 5 [IU] via SUBCUTANEOUS
  Administered 2015-09-30 (×2): 3 [IU] via SUBCUTANEOUS
  Administered 2015-09-30 – 2015-10-01 (×2): 5 [IU] via SUBCUTANEOUS
  Administered 2015-10-01: 8 [IU] via SUBCUTANEOUS
  Administered 2015-10-01: 5 [IU] via SUBCUTANEOUS

## 2015-09-23 MED ORDER — POTASSIUM CHLORIDE CRYS ER 20 MEQ PO TBCR
40.0000 meq | EXTENDED_RELEASE_TABLET | Freq: Every day | ORAL | Status: DC
Start: 2015-09-23 — End: 2015-09-23

## 2015-09-23 MED ORDER — APIXABAN 5 MG PO TABS
5.0000 mg | ORAL_TABLET | Freq: Two times a day (BID) | ORAL | Status: DC
  Administered 2015-09-23 – 2015-10-01 (×16): 5 mg via ORAL
  Filled 2015-09-23 (×16): qty 1

## 2015-09-23 MED ORDER — POTASSIUM CHLORIDE CRYS ER 20 MEQ PO TBCR
20.0000 meq | EXTENDED_RELEASE_TABLET | Freq: Every day | ORAL | Status: DC
  Administered 2015-09-24 – 2015-10-01 (×8): 20 meq via ORAL
  Filled 2015-09-23 (×8): qty 1

## 2015-09-23 MED ORDER — FUROSEMIDE 10 MG/ML IJ SOLN
40.0000 mg | Freq: Three times a day (TID) | INTRAMUSCULAR | Status: DC
  Administered 2015-09-23 – 2015-09-26 (×9): 40 mg via INTRAVENOUS
  Filled 2015-09-23 (×9): qty 4

## 2015-09-23 NOTE — Consult Note (Signed)
Reason for Consult:Decompensated congestive heart failure Referring Physician: [facility]  Elizabeth Pugh is an 82 y.o. female.  [id number] is a 82 year old female with past medical history significant for multiple medical problems, i.e. Hypertension,type 2 diabetes mellitus, hyperlipidemia, history of congestive heart failure secondary to diastolic dysfunction, chronic atrial fibrillation CHADSVAS /score of 8, on chronic anticoagulation, history of CVA with left paresis in 1998, and TIA in 2012, morbid obesity, osteoarthritis, seizure disorder, restless leg syndrome, history of GI stromal tumor in the past,recently discharged from the hospital because of progressive increasing shortness of breath associated with leg swelling.  Patient was noted to be hypoxic with O2 sats of 80% on room air.  Apparently, her Lasix was stopped prior to the discharge. .  Patient denies chest pain.  Patient denies palpitation, lightheadedness or syncope.  Denies noncompliance to medication or excessive salty food intake.  Past Medical History  Diagnosis Date  . Stroke ([place])   . GERD (gastroesophageal reflux disease)   . Hypertension   . Diabetes mellitus   . Blood transfusion   . Arthritis   . Anxiety   . Anemia   . GI (gastrointestinal bleed)     Due to benign mass  . Bronchitis   . Restless leg syndrome   . Atrial fibrillation ([place])   . Acute delirium 04/12/2011  . Seizures ([place])   . CHF (congestive heart failure) ([place])   . Acute kidney injury ([place]) 09/02/2015  . Lobar pneumonia due to unspecified organism 09/02/2015    Past Surgical History  Procedure Laterality Date  . Eye surgery      Cataract removal  . Abdominal hysterectomy    . Tubal ligation    . Eus  09/24/2011    Procedure: UPPER ENDOSCOPIC ULTRASOUND (EUS) LINEAR;  Surgeon: Beryle Beams, MD;  Location: [facility];  Service: Endoscopy;  Laterality: N/A;    Family History  Problem Relation Age of Onset  . Aneurysm Mother   .  Heart attack Father   . Heart failure Sister   . Asthma Sister   . Kidney failure Sister   . Stroke Sister     Social History:  reports that she quit smoking about 30 years ago. Her smoking use included Cigarettes. She has never used smokeless tobacco. She reports that she does not drink alcohol or use illicit drugs.  Allergies:  Allergies  Allergen Reactions  . Erythromycin Other (See Comments)    Vaginal itching  . Penicillins Rash    Has patient had a PCN reaction causing immediate rash, facial/tongue/throat swelling, SOB or lightheadedness with hypotension: No Has patient had a PCN reaction causing severe rash involving mucus membranes or skin necrosis: No Has patient had a PCN reaction that required hospitalization: No Has patient had a PCN reaction occurring within the last 10 years: No If all of the above answers are "NO", then may proceed with Cephalosporin use.  Marland Kitchen Zithromax [Azithromycin] Other (See Comments)    gave her a yeast infection.    Medications: I have reviewed the patient's current medications.  Results for orders placed or performed during the hospital encounter of 09/22/15 (from the past 48 hour(s))  Comprehensive metabolic panel     Status: Abnormal   Collection Time: 09/22/15  3:13 PM  Result Value Ref Range   Sodium 141 135 - 145 mmol/L   Potassium 4.5 3.5 - 5.1 mmol/L   Chloride 106 101 - 111 mmol/L   CO2 29 22 - 32 mmol/L  Glucose, Bld 196 (H) 65 - 99 mg/dL   BUN 23 (H) 6 - 20 mg/dL   Creatinine, Ser 0.82 0.44 - 1.00 mg/dL   Calcium 9.0 8.9 - 10.3 mg/dL   Total Protein 6.0 (L) 6.5 - 8.1 g/dL   Albumin 2.8 (L) 3.5 - 5.0 g/dL   AST 12 (L) 15 - 41 U/L   ALT 8 (L) 14 - 54 U/L   Alkaline Phosphatase 53 38 - 126 U/L   Total Bilirubin 0.7 0.3 - 1.2 mg/dL   GFR calc non Af Amer >60 >60 mL/min   GFR calc Af Amer >60 >60 mL/min    Comment: (NOTE) The eGFR has been calculated using the CKD EPI equation. This calculation has not been validated in all  clinical situations. eGFR's persistently <60 mL/min signify possible Chronic Kidney Disease.    Anion gap 6 5 - 15  CBC WITH DIFFERENTIAL     Status: Abnormal   Collection Time: 09/22/15  3:13 PM  Result Value Ref Range   WBC 3.4 (L) 4.0 - 10.5 K/uL   RBC 4.19 3.87 - 5.11 MIL/uL   Hemoglobin 12.2 12.0 - 15.0 g/dL   HCT 41.7 36.0 - 46.0 %   MCV 99.5 78.0 - 100.0 fL   MCH 29.1 26.0 - 34.0 pg   MCHC 29.3 (L) 30.0 - 36.0 g/dL   RDW 15.3 11.5 - 15.5 %   Platelets 106 (L) 150 - 400 K/uL    Comment: SPECIMEN CHECKED FOR CLOTS REPEATED TO VERIFY PLATELET COUNT CONFIRMED BY SMEAR    Neutrophils Relative % 55 %   Neutro Abs 1.9 1.7 - 7.7 K/uL   Lymphocytes Relative 32 %   Lymphs Abs 1.1 0.7 - 4.0 K/uL   Monocytes Relative 11 %   Monocytes Absolute 0.4 0.1 - 1.0 K/uL   Eosinophils Relative 2 %   Eosinophils Absolute 0.1 0.0 - 0.7 K/uL   Basophils Relative 0 %   Basophils Absolute 0.0 0.0 - 0.1 K/uL  Troponin I ([facility])     Status: None   Collection Time: 09/22/15  3:13 PM  Result Value Ref Range   Troponin I <0.03 <0.031 ng/mL    Comment:        NO INDICATION OF MYOCARDIAL INJURY.   Brain natriuretic peptide     Status: Abnormal   Collection Time: 09/22/15  3:13 PM  Result Value Ref Range   B Natriuretic Peptide 173.0 (H) 0.0 - 100.0 pg/mL  Urinalysis, Routine w reflex microscopic (not at [facility])     Status: Abnormal   Collection Time: 09/22/15  9:15 PM  Result Value Ref Range   Color, Urine STRAW (A) YELLOW   APPearance CLEAR CLEAR   Specific Gravity, Urine 1.016 1.005 - 1.030   pH 5.5 5.0 - 8.0   Glucose, UA NEGATIVE NEGATIVE mg/dL   Hgb urine dipstick SMALL (A) NEGATIVE   Bilirubin Urine NEGATIVE NEGATIVE   Ketones, ur NEGATIVE NEGATIVE mg/dL   Protein, ur NEGATIVE NEGATIVE mg/dL   Nitrite NEGATIVE NEGATIVE   Leukocytes, UA NEGATIVE NEGATIVE  Urine microscopic-add on     Status: Abnormal   Collection Time: 09/22/15  9:15 PM  Result Value Ref Range   Squamous Epithelial  / LPF 0-5 (A) NONE SEEN   WBC, UA 0-5 0 - 5 WBC/hpf   RBC / HPF 6-30 0 - 5 RBC/hpf   Bacteria, UA NONE SEEN NONE SEEN  Glucose, capillary     Status: Abnormal   Collection Time:  09/22/15 10:42 PM  Result Value Ref Range   Glucose-Capillary 143 (H) 65 - 99 mg/dL   Comment 1 Notify RN    Comment 2 Document in Chart   Basic metabolic panel     Status: Abnormal   Collection Time: 09/23/15  3:23 AM  Result Value Ref Range   Sodium 141 135 - 145 mmol/L   Potassium 4.3 3.5 - 5.1 mmol/L   Chloride 102 101 - 111 mmol/L   CO2 34 (H) 22 - 32 mmol/L   Glucose, Bld 178 (H) 65 - 99 mg/dL   BUN 20 6 - 20 mg/dL   Creatinine, Ser 0.79 0.44 - 1.00 mg/dL   Calcium 8.9 8.9 - 10.3 mg/dL   GFR calc non Af Amer >60 >60 mL/min   GFR calc Af Amer >60 >60 mL/min    Comment: (NOTE) The eGFR has been calculated using the CKD EPI equation. This calculation has not been validated in all clinical situations. eGFR's persistently <60 mL/min signify possible Chronic Kidney Disease.    Anion gap 5 5 - 15  Glucose, capillary     Status: Abnormal   Collection Time: 09/23/15  5:57 AM  Result Value Ref Range   Glucose-Capillary 158 (H) 65 - 99 mg/dL  Glucose, capillary     Status: Abnormal   Collection Time: 09/23/15 11:19 AM  Result Value Ref Range   Glucose-Capillary 150 (H) 65 - 99 mg/dL   Comment 1 Notify RN     Dg Chest 2 View  09/22/2015  CLINICAL DATA:  82 year old with acute onset of shortness of breath yesterday. Current history of hypertension, diabetes and CHF. Former smoker. EXAM: CHEST  2 VIEW COMPARISON:  08/29/2015 and earlier, including CTA chest 01/27/2015. FINDINGS: AP semi-erect and lateral images were obtained. Cardiac silhouette markedly enlarged, unchanged. Thoracic aorta tortuous, unchanged. Hilar and mediastinal contours otherwise unremarkable. Loculated pleural effusion posteriorly on the right with extension into the major fissure, markedly increased in size since September, 2016 where  it was not visible on the x-ray but was present on the CT. No visible left pleural effusion. Lungs otherwise clear. Mild pulmonary venous hypertension without overt edema. IMPRESSION: Large loculated right pleural effusion with extension into the major fissure, increased in size since September, 2016. Stable cardiomegaly without overt edema. Electronically Signed   By: Evangeline Dakin M.D.   On: 09/22/2015 16:01   Ct Angio Chest Pe W/cm &/or Wo Cm  09/22/2015  CLINICAL DATA:  82 year old female with shortness of breath and right-sided pleural effusion EXAM: CT ANGIOGRAPHY CHEST WITH CONTRAST TECHNIQUE: Multidetector CT imaging of the chest was performed using the standard protocol during bolus administration of intravenous contrast. Multiplanar CT image reconstructions and MIPs were obtained to evaluate the vascular anatomy. CONTRAST:  85 mL Isovue 370 COMPARISON:  Chest x-ray obtained concurrently; most recent chest CT 01/27/2015 FINDINGS: Mediastinum: Unremarkable CT appearance of the thyroid gland. No suspicious mediastinal or hilar adenopathy. No soft tissue mediastinal mass. The thoracic esophagus is unremarkable. Heart/Vascular: Adequate opacification of the pulmonary arteries to the proximal subsegmental level. No evidence of acute pulmonary embolus. Conventional 3 vessel arch anatomy. No aneurysm. Cardiomegaly. Right heart enlargement. No pericardial effusion. Atherosclerotic calcifications along the coronary arteries. Lungs/Pleura: Small right and trace left layering pleural effusions. Mild interlobular septal thickening and diffuse mild ground-glass attenuation airspace opacity most consistent with mild interstitial edema. Passive atelectasis in the lower lobes. Mild bronchial wall thickening diffusely. Bones/Soft Tissues: No acute fracture or aggressive appearing lytic or blastic  osseous lesion. Upper Abdomen: Stable gastric antral lipoma. Visualized upper abdominal organs are unremarkable. Review of  the MIP images confirms the above findings. IMPRESSION: 1. Negative for acute pulmonary embolus. 2. Mild pulmonary edema. In the setting of cardiomegaly this is concerning for mild congestive heart failure. 3. Small right and trace left layering pleural effusions with associated bibasilar atelectasis. 4. Atherosclerosis including coronary artery calcifications. Electronically Signed   By: Jacqulynn Cadet M.D.   On: 09/22/2015 19:26    Review of Systems  Constitutional: Negative for fever and chills.  Eyes: Negative for double vision and photophobia.  Respiratory: Positive for shortness of breath. Negative for cough and hemoptysis.   Cardiovascular: Positive for leg swelling. Negative for chest pain.  Gastrointestinal: Negative for nausea and vomiting.  Genitourinary: Negative for dysuria.  Neurological: Negative for dizziness and headaches.   Blood pressure 124/59, pulse 66, temperature 98.2 F (36.8 C), temperature source Oral, resp. rate 18, height '5\' 7"'  (1.702 m), weight 161.072 kg (355 lb 1.6 oz), SpO2 99 %. Physical Exam  Constitutional: She is oriented to person, place, and time.  Eyes: Conjunctivae are normal. Pupils are equal, round, and reactive to light. Left eye exhibits no discharge. No scleral icterus.  Neck: Neck supple. JVD present. No tracheal deviation present.  Cardiovascular:  Irregularly irregular, S1, S2, soft,  soft S3 gallop noted  Respiratory:  Decreased breath sounds at bases  GI: Soft. Bowel sounds are normal. She exhibits distension. There is no tenderness. There is no rebound.  Musculoskeletal:  No clubbing, cyanosis 4+ edema noted  Neurological: She is alert and oriented to person, place, and time.    Assessment/Plan: Resolving decompensated congestive heart failure secondary to preserved LV systolic function. Hypertension. Type 2 diabetes mellitus Chronic atrial fibrillation on chronic anticoagulation. Hyperlipidemia. Morbid obesity. History of CVA  and TIA in the past. Seizure disorder. Restless leg syndrome. 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 Degenerative joint disease. Seizure disorder. hypoalbuminemia Plan Agree with present management. Strict I&O's and daily weights.  Restrict fluid to 1 L per 24 hours.   Charolette Forward 09/23/2015, 5:11 PM

## 2015-09-23 NOTE — Progress Notes (Signed)
Orthopedic Tech Progress Note Patient Details:  Elizabeth Pugh Mar 26, 1927 [id number]  Ortho Devices Type of Ortho Device: Louretta Parma boot Ortho Device/Splint Location: bilateral Ortho Device/Splint Interventions: Application   [name] 09/23/2015, 2:58 PM

## 2015-09-23 NOTE — Progress Notes (Signed)
PROGRESS NOTE                                                                                                                                                                                                             Patient Demographics:    Elizabeth Pugh, is a 82 y.o. female, DOB - 23-Apr-1927, [id number]  Admit date - 09/22/2015   Admitting Physician Etta Quill, DO  Outpatient Primary MD for the patient is Philis Fendt, MD  LOS -   Chief Complaint  Patient presents with  . Leg Swelling  . Arm Swelling       Brief Narrative     Elizabeth Pugh is a 82 y.o. female with medical history significant of Chronic diastolic CHF, DM, HTN. Patient presents to the ED with BLE edema that has been slowly worsening since discharge from the hospital on the first of the month. Patient was taken off of her lasix at the time of admission to the hospital and it was not restarted due to pre-renal azotemia at that time. She has associated SOB. 80% O2 on room air, 94% on 2L. Patient treated for bronchitis and PNA 3 weeks ago.  ED Course: Found to have mild CHF and pulmonary edema on imaging studies in ED.   Subjective:    Elizabeth Pugh today has, No headache, No chest pain, No abdominal pain - No Nausea, No new weakness tingling or numbness, No Cough - Improved shortness of breath.   Assessment  & Plan :      1.Acute on chronic diastolic CHF last EF on recent echo 60%. Continue Coreg, switched to IV Lasix 40 mg 3 times a day, monitor intake and output and daily weights, monitor BMP. Cardiology consulted. Stop Norvasc due to massive edema.  2. Hypertension. Currently on Coreg and diuretic will monitor. Norvasc stopped due to edema.  3. Morbid obesity with generalized deconditioning and bedbound status. Follow with PCP for weight loss, PT eval, may require SNF placement.  4. Restless leg syndrome. On Requip.   5.  GERD. On PPI continue.   6. Underlying COPD. Compensated no wheezing, oxygen and nebulizer treatments as needed.   7. Diabetic neuropathy. On Lyrica to continue.   8. Hyperactive bladder. On doxepin.   9. Chronic atrial fibrillation. [place] vasc 2 score of greater than 4.  On Coreg and liquids.  10. History of seizures. On Keppra.   11. DM type II. Currently on Lantus and sliding scale will monitor.  Lab Results  Component Value Date   HGBA1C 9.6* 08/29/2015   CBG (last 3)   Recent Labs  09/22/15 2242 09/23/15 0557 09/23/15 1119  GLUCAP 143* 158* 150*        Code Status : [name]  Family Communication  :  None present  Disposition Plan  :  To be decided  Consults  : Cardiology  Procedures  :    Upper extremity venous duplex. No DVT SVT.  DVT Prophylaxis  :  Eliquis  Lab Results  Component Value Date   PLT 106* 09/22/2015    Inpatient Medications  Scheduled Meds: . apixaban  2.5 mg Oral BID  . carvedilol  40 mg Oral Daily  . doxepin  25 mg Oral QHS  . ferrous sulfate  325 mg Oral Q breakfast  . furosemide  40 mg Intravenous TID  . guaiFENesin  1,200 mg Oral BID  . insulin aspart  0-15 Units Subcutaneous TID WC  . insulin glargine  15 Units Subcutaneous QHS  . levETIRAcetam  500 mg Oral BID  . pantoprazole  40 mg Oral Q0600  . pregabalin  75 mg Oral BID  . rOPINIRole  2 mg Oral QID  . sodium chloride flush  3 mL Intravenous Q12H   Continuous Infusions:  PRN Meds:.sodium chloride, acetaminophen, albuterol, ALPRAZolam, diclofenac sodium, hydrOXYzine, ipratropium-albuterol, ondansetron (ZOFRAN) IV, sodium chloride flush  Antibiotics  :    Anti-infectives    None         Objective:   Filed Vitals:   09/22/15 2030 09/22/15 2115 09/22/15 2239 09/23/15 0750  BP: 138/52 118/94 115/58 110/51  Pulse: 79 107 95 81  Temp:   98.2 F (36.8 C) 97.4 F (36.3 C)  TempSrc:   Oral Oral  Resp: 17 18 20 18   Height:   5\' 7"  (1.702 m)   Weight:   161.072  kg (355 lb 1.6 oz)   SpO2: 99% 96% 98% 99%    Wt Readings from Last 3 Encounters:  09/22/15 161.072 kg (355 lb 1.6 oz)  01/28/15 122.471 kg (270 lb)  01/25/15 123.5 kg (272 lb 4.3 oz)     Intake/Output Summary (Last 24 hours) at 09/23/15 1239 Last data filed at 09/23/15 0906  Gross per 24 hour  Intake    240 ml  Output   2200 ml  Net  -1960 ml     Physical Exam  Awake Alert, Oriented X 3, No new F.N deficits, Normal affect [place].AT,PERRAL Supple Neck,No JVD, No cervical lymphadenopathy appriciated.  Symmetrical Chest wall movement, Good air movement bilaterally, CTAB RRR,No Gallops,Rubs or new Murmurs, No Parasternal Heave +ve B.Sounds, Abd Soft, No tenderness, No organomegaly appriciated, No rebound - guarding or rigidity. No Cyanosis, Clubbing 2 +edema, No new Rash or bruise       Data Review:    CBC  Recent Labs Lab 09/22/15 1513  WBC 3.4*  HGB 12.2  HCT 41.7  PLT 106*  MCV 99.5  MCH 29.1  MCHC 29.3*  RDW 15.3  LYMPHSABS 1.1  MONOABS 0.4  EOSABS 0.1  BASOSABS 0.0    Chemistries   Recent Labs Lab 09/22/15 1513 09/23/15 0323  NA 141 141  K 4.5 4.3  CL 106 102  CO2 29 34*  GLUCOSE 196* 178*  BUN 23* 20  CREATININE 0.82 0.79  CALCIUM  9.0 8.9  AST 12*  --   ALT 8*  --   ALKPHOS 53  --   BILITOT 0.7  --    ------------------------------------------------------------------------------------------------------------------ No results for input(s): CHOL, HDL, LDLCALC, TRIG, CHOLHDL, LDLDIRECT in the last 72 hours.  Lab Results  Component Value Date   HGBA1C 9.6* 08/29/2015   ------------------------------------------------------------------------------------------------------------------ No results for input(s): TSH, T4TOTAL, T3FREE, THYROIDAB in the last 72 hours.  Invalid input(s): FREET3 ------------------------------------------------------------------------------------------------------------------ No results for input(s): VITAMINB12,  FOLATE, FERRITIN, TIBC, IRON, RETICCTPCT in the last 72 hours.  Coagulation profile No results for input(s): INR, PROTIME in the last 168 hours.  No results for input(s): DDIMER in the last 72 hours.  Cardiac Enzymes  Recent Labs Lab 09/22/15 1513  TROPONINI <0.03   ------------------------------------------------------------------------------------------------------------------    Component Value Date/Time   BNP 173.0* 09/22/2015 1513    Micro Results No results found for this or any previous visit (from the past 240 hour(s)).  Radiology Reports Dg Chest 2 View  09/22/2015  CLINICAL DATA:  82 year old with acute onset of shortness of breath yesterday. Current history of hypertension, diabetes and CHF. Former smoker. EXAM: CHEST  2 VIEW COMPARISON:  08/29/2015 and earlier, including CTA chest 01/27/2015. FINDINGS: AP semi-erect and lateral images were obtained. Cardiac silhouette markedly enlarged, unchanged. Thoracic aorta tortuous, unchanged. Hilar and mediastinal contours otherwise unremarkable. Loculated pleural effusion posteriorly on the right with extension into the major fissure, markedly increased in size since September, 2016 where it was not visible on the x-ray but was present on the CT. No visible left pleural effusion. Lungs otherwise clear. Mild pulmonary venous hypertension without overt edema. IMPRESSION: Large loculated right pleural effusion with extension into the major fissure, increased in size since September, 2016. Stable cardiomegaly without overt edema. Electronically Signed   By: Evangeline Dakin M.D.   On: 09/22/2015 16:01   Ct Angio Chest Pe W/cm &/or Wo Cm  09/22/2015  CLINICAL DATA:  82 year old female with shortness of breath and right-sided pleural effusion EXAM: CT ANGIOGRAPHY CHEST WITH CONTRAST TECHNIQUE: Multidetector CT imaging of the chest was performed using the standard protocol during bolus administration of intravenous contrast. Multiplanar CT  image reconstructions and MIPs were obtained to evaluate the vascular anatomy. CONTRAST:  85 mL Isovue 370 COMPARISON:  Chest x-ray obtained concurrently; most recent chest CT 01/27/2015 FINDINGS: Mediastinum: Unremarkable CT appearance of the thyroid gland. No suspicious mediastinal or hilar adenopathy. No soft tissue mediastinal mass. The thoracic esophagus is unremarkable. Heart/Vascular: Adequate opacification of the pulmonary arteries to the proximal subsegmental level. No evidence of acute pulmonary embolus. Conventional 3 vessel arch anatomy. No aneurysm. Cardiomegaly. Right heart enlargement. No pericardial effusion. Atherosclerotic calcifications along the coronary arteries. Lungs/Pleura: Small right and trace left layering pleural effusions. Mild interlobular septal thickening and diffuse mild ground-glass attenuation airspace opacity most consistent with mild interstitial edema. Passive atelectasis in the lower lobes. Mild bronchial wall thickening diffusely. Bones/Soft Tissues: No acute fracture or aggressive appearing lytic or blastic osseous lesion. Upper Abdomen: Stable gastric antral lipoma. Visualized upper abdominal organs are unremarkable. Review of the MIP images confirms the above findings. IMPRESSION: 1. Negative for acute pulmonary embolus. 2. Mild pulmonary edema. In the setting of cardiomegaly this is concerning for mild congestive heart failure. 3. Small right and trace left layering pleural effusions with associated bibasilar atelectasis. 4. Atherosclerosis including coronary artery calcifications. Electronically Signed   By: Jacqulynn Cadet M.D.   On: 09/22/2015 19:26   Dg Chest Port 1 View  08/29/2015  CLINICAL DATA:  Shortness of breath today History:  Diabetes Hypertension GERD EXAM: PORTABLE CHEST 1 VIEW COMPARISON:  01/27/2015 FINDINGS: Cardiac silhouette mildly enlarged. No mediastinal or hilar masses or evidence of adenopathy. There mild prominence of the bronchovascular  markings bilaterally accentuated by the AP technique and patient's body habitus. No overt pulmonary edema. No evidence of pneumonia. No convincing pleural effusion and no pneumothorax. Bony thorax is demineralized. There are changes prior shoulder surgery the left, stable. IMPRESSION: No acute cardiopulmonary disease. Electronically Signed   By: Lajean Manes M.D.   On: 08/29/2015 12:33    Time Spent in minutes  30   SINGH,PRASHANT K M.D on 09/23/2015 at 12:39 PM  Between 7am to 7pm - Pager - [phone]  After 7pm go to [email or web address] - password [facility]  [facility] -  Office  [phone]

## 2015-09-23 NOTE — Progress Notes (Signed)
1752 resting comfortably . Both legs elevated on pillows , unna boots on both legs.both upper arms  Resting on pillows. Daughter at bedside

## 2015-09-24 LAB — BASIC METABOLIC PANEL
Anion gap: 5 (ref 5–15)
BUN: 17 mg/dL (ref 6–20)
CHLORIDE: 96 mmol/L — AB (ref 101–111)
CO2: 38 mmol/L — AB (ref 22–32)
CREATININE: 0.86 mg/dL (ref 0.44–1.00)
Calcium: 8.3 mg/dL — ABNORMAL LOW (ref 8.9–10.3)
GFR calc non Af Amer: 59 mL/min — ABNORMAL LOW (ref >60)
Glucose, Bld: 177 mg/dL — ABNORMAL HIGH (ref 65–99)
Potassium: 4.3 mmol/L (ref 3.5–5.1)
Sodium: 139 mmol/L (ref 135–145)

## 2015-09-24 LAB — GLUCOSE, CAPILLARY
GLUCOSE-CAPILLARY: 152 mg/dL — AB (ref 65–99)
GLUCOSE-CAPILLARY: 233 mg/dL — AB (ref 65–99)
GLUCOSE-CAPILLARY: 247 mg/dL — AB (ref 65–99)
Glucose-Capillary: 170 mg/dL — ABNORMAL HIGH (ref 65–99)
Glucose-Capillary: 164 mg/dL — ABNORMAL HIGH (ref 65–99)

## 2015-09-24 NOTE — Progress Notes (Signed)
Subjective:  Patient denies any chest pain states breathing has improved diuresing well  Objective:  Vital Signs in the last 24 hours: Temp:  [97.7 F (36.5 C)-98.5 F (36.9 C)] 98.1 F (36.7 C) (05/23 0900) Pulse Rate:  [66-87] 87 (05/23 0900) Resp:  [18-20] 20 (05/23 0900) BP: (108-128)/(58-79) 108/58 mmHg (05/23 0900) SpO2:  [94 %-100 %] 100 % (05/23 0924) Weight:  [138.801 kg (306 lb)] 138.801 kg (306 lb) (05/23 0543)  Intake/Output from previous day: 05/22 0701 - 05/23 0700 In: 720 [P.O.:720] Out: 3225 [Urine:3225] Intake/Output from this shift: Total I/O In: 50 [P.O.:50] Out: 275 [Urine:275]  Physical Exam: Neck: no adenopathy, no carotid bruit, no JVD and supple, symmetrical, trachea midline Lungs: Decreased breath sound at bases Heart: irregularly irregular rhythm, S1, S2 normal and Soft systolic murmur noted Abdomen: soft, non-tender; bowel sounds normal; no masses,  no organomegaly Extremities: No clubbing cyanosis 4+ edema noted  Lab Results:  Recent Labs  09/22/15 1513  WBC 3.4*  HGB 12.2  PLT 106*    Recent Labs  09/23/15 0323 09/24/15 0233  NA 141 139  K 4.3 4.3  CL 102 96*  CO2 34* 38*  GLUCOSE 178* 177*  BUN 20 17  CREATININE 0.79 0.86    Recent Labs  09/22/15 1513  TROPONINI <0.03   Hepatic Function Panel  Recent Labs  09/22/15 1513  PROT 6.0*  ALBUMIN 2.8*  AST 12*  ALT 8*  ALKPHOS 53  BILITOT 0.7   No results for input(s): CHOL in the last 72 hours. No results for input(s): PROTIME in the last 72 hours.  Imaging: Imaging results have been reviewed and Dg Chest 2 View  09/22/2015  CLINICAL DATA:  82 year old with acute onset of shortness of breath yesterday. Current history of hypertension, diabetes and CHF. Former smoker. EXAM: CHEST  2 VIEW COMPARISON:  08/29/2015 and earlier, including CTA chest 01/27/2015. FINDINGS: AP semi-erect and lateral images were obtained. Cardiac silhouette markedly enlarged, unchanged. Thoracic  aorta tortuous, unchanged. Hilar and mediastinal contours otherwise unremarkable. Loculated pleural effusion posteriorly on the right with extension into the major fissure, markedly increased in size since September, 2016 where it was not visible on the x-ray but was present on the CT. No visible left pleural effusion. Lungs otherwise clear. Mild pulmonary venous hypertension without overt edema. IMPRESSION: Large loculated right pleural effusion with extension into the major fissure, increased in size since September, 2016. Stable cardiomegaly without overt edema. Electronically Signed   By: Evangeline Dakin M.D.   On: 09/22/2015 16:01   Ct Angio Chest Pe W/cm &/or Wo Cm  09/22/2015  CLINICAL DATA:  82 year old female with shortness of breath and right-sided pleural effusion EXAM: CT ANGIOGRAPHY CHEST WITH CONTRAST TECHNIQUE: Multidetector CT imaging of the chest was performed using the standard protocol during bolus administration of intravenous contrast. Multiplanar CT image reconstructions and MIPs were obtained to evaluate the vascular anatomy. CONTRAST:  85 mL Isovue 370 COMPARISON:  Chest x-ray obtained concurrently; most recent chest CT 01/27/2015 FINDINGS: Mediastinum: Unremarkable CT appearance of the thyroid gland. No suspicious mediastinal or hilar adenopathy. No soft tissue mediastinal mass. The thoracic esophagus is unremarkable. Heart/Vascular: Adequate opacification of the pulmonary arteries to the proximal subsegmental level. No evidence of acute pulmonary embolus. Conventional 3 vessel arch anatomy. No aneurysm. Cardiomegaly. Right heart enlargement. No pericardial effusion. Atherosclerotic calcifications along the coronary arteries. Lungs/Pleura: Small right and trace left layering pleural effusions. Mild interlobular septal thickening and diffuse mild ground-glass attenuation airspace opacity  most consistent with mild interstitial edema. Passive atelectasis in the lower lobes. Mild bronchial  wall thickening diffusely. Bones/Soft Tissues: No acute fracture or aggressive appearing lytic or blastic osseous lesion. Upper Abdomen: Stable gastric antral lipoma. Visualized upper abdominal organs are unremarkable. Review of the MIP images confirms the above findings. IMPRESSION: 1. Negative for acute pulmonary embolus. 2. Mild pulmonary edema. In the setting of cardiomegaly this is concerning for mild congestive heart failure. 3. Small right and trace left layering pleural effusions with associated bibasilar atelectasis. 4. Atherosclerosis including coronary artery calcifications. Electronically Signed   By: Jacqulynn Cadet M.D.   On: 09/22/2015 19:26    Cardiac Studies:  Assessment/Plan:  Resolving decompensated congestive heart failure secondary to preserved LV systolic function. Hypertension. Type 2 diabetes mellitus Chronic atrial fibrillation on chronic anticoagulation. Hyperlipidemia. Morbid obesity. History of CVA and TIA in the past. Seizure disorder. Restless leg syndrome. 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 Degenerative joint disease. Seizure disorder. Hypoalbuminemia Plan Continue present management Monitor renal function Check labs in a.m.  LOS: 1 day    Charolette Forward 09/24/2015, 10:31 AM

## 2015-09-24 NOTE — Evaluation (Signed)
Physical Therapy Evaluation Patient Details Name: Elizabeth Pugh MRN: [id number] DOB: 1926-09-29 Today's Date: 09/24/2015   History of Present Illness  Pt is a 82 y/o F who presented w/ Bil LE edema.  Found to have mild CHF and pulmonary edema on imaging studies.  Pt's PMH includes chronic diastolic CHF, stroke, anxiety, anemia, seizures, a-fib.    Clinical Impression  Pt admitted with above diagnosis. Pt currently with functional limitations due to the deficits listed below (see PT Problem List). PTA pt's daughter providing care for ADLs and mobility but per pt, daughter is no longer able to provide level of care pt requires.  Pt reports she has been non ambulatory for a few months. She currently requires max +2 assist for bed mobility and poor tolerance and balance sitting EOB. Recommending SNF at d/c.   Pt will benefit from skilled PT to increase their independence and safety with mobility to allow discharge to the venue listed below.      Follow Up Recommendations SNF;Supervision for mobility/OOB    Equipment Recommendations  None recommended by PT    Recommendations for Other Services       Precautions / Restrictions Precautions Precautions: Fall Precaution Comments: monitor O2 Restrictions Weight Bearing Restrictions: No      Mobility  Bed Mobility Overal bed mobility: Needs Assistance;+2 for physical assistance Bed Mobility: Supine to Sit;Sit to Supine     Supine to sit: Max assist;+2 for physical assistance;HOB elevated Sit to supine: Max assist;+2 for physical assistance   General bed mobility comments: Cues for hand placement to utilize rail.  Assist managing Bil LEs and elevating trunk.  Pt hold her breath w/ her face turning red and has a hard time following cues to continue to breathe.  Pt unable to catch her breath and SpO2 w/ poor reading, therefore assisted pt to supine.  Transfers                 General transfer comment: Not attempted for  pt/therapist safety  Ambulation/Gait                Stairs            Wheelchair Mobility    Modified Rankin (Stroke Patients Only)       Balance Overall balance assessment: Needs assistance Sitting-balance support: Bilateral upper extremity supported;Feet supported Sitting balance-Leahy Scale: Poor Sitting balance - Comments: Posterior lean w/ Bil UEs supported (one holding onto rail and one on bed).  Requires up to mod assist to sit EOB ~5 minutes.  Postural control: Posterior lean                                   Pertinent Vitals/Pain Pain Assessment: 0-10 Pain Score: 10-Worst pain ever Pain Location: Bil knees Pain Descriptors / Indicators: Aching;Grimacing;Moaning Pain Intervention(s): Limited activity within patient's tolerance;Monitored during session;Repositioned    Home Living Family/patient expects to be discharged to:: Unsure Living Arrangements: Children Available Help at Discharge: Family;Available 24 hours/day Type of Home: House Home Access: Ramped entrance     Home Layout: One level Home Equipment: Hospital bed;Transport chair;Electric scooter;Bedside commode;Other (comment) (hoyer lift)      Prior Function Level of Independence: Needs assistance   Gait / Transfers Assistance Needed: Total assist.  Reports she has not been OOB for months  ADL's / Homemaking Assistance Needed: Total assist for bathing, dressing, toileting.  Hand Dominance   Dominant Hand: Right    Extremity/Trunk Assessment   Upper Extremity Assessment: Defer to OT evaluation           Lower Extremity Assessment: RLE deficits/detail;LLE deficits/detail RLE Deficits / Details: strength grossly 2/5; although not formally tested as pt c/o pain w/ any mobility Bil LEs LLE Deficits / Details: strength grossly 2/5; although not formally tested as pt c/o pain w/ any mobility Bil LEs  Cervical / Trunk Assessment: Kyphotic  Communication    Communication: No difficulties  Cognition Arousal/Alertness: Awake/alert;Lethargic (closes eyes when not active) Behavior During Therapy: WFL for tasks assessed/performed Overall Cognitive Status: Within Functional Limits for tasks assessed                      General Comments General comments (skin integrity, edema, etc.): Pt reports that her daughter is beginning to have a difficult time care for pt.  Daughter has knee and back pain.  Spoke w/ RN about potentially utilizing skin barrier towlettes (could not recall the name) to place between skin folds.  Suggested to contact [place] RN if unable to determine name and/or appropriateness.    Exercises General Exercises - Lower Extremity Ankle Circles/Pumps: AROM;Both;Supine;10 reps      Assessment/Plan    PT Assessment Patient needs continued PT services  PT Diagnosis Generalized weakness   PT Problem List Decreased strength;Decreased range of motion;Decreased balance;Decreased activity tolerance;Decreased mobility;Decreased safety awareness;Cardiopulmonary status limiting activity;Obesity;Pain  PT Treatment Interventions DME instruction;Functional mobility training;Therapeutic activities;Therapeutic exercise;Balance training;Patient/family education;Modalities   PT Goals (Current goals can be found in the Care Plan section) Acute Rehab PT Goals Patient Stated Goal: to go to rehab before home PT Goal Formulation: With patient Time For Goal Achievement: 10/08/15 Potential to Achieve Goals: Fair    Frequency Min 2X/week   Barriers to discharge Decreased caregiver support Pt reports that her daughter is no longer able to provide needed care for her    Co-evaluation               End of Session Equipment Utilized During Treatment: Oxygen Activity Tolerance: Patient limited by fatigue;Other (comment) (Dyspnea) Patient left: in bed;with call bell/phone within reach;with bed alarm set;with nursing/sitter in room Nurse  Communication: Mobility status;Need for lift equipment         Time: 1346-1406 PT Time Calculation (min) (ACUTE ONLY): 20 min   Charges:   PT Evaluation $PT Eval Moderate Complexity: 1 Procedure     PT G Codes:       Collie Siad PT, DPT  Pager: [phone] Phone: [phone] 09/24/2015, 2:45 PM

## 2015-09-24 NOTE — Progress Notes (Signed)
1752 eating dinner with appetite

## 2015-09-24 NOTE — Clinical Social Work Note (Signed)
CSW acknowledges SNF consult. Waiting on PT recommendations.  Dorion Petillo, CSW [phone]  

## 2015-09-24 NOTE — Progress Notes (Signed)
PROGRESS NOTE                                                                                                                                                                                                             Patient Demographics:    Elizabeth Pugh, is a 82 y.o. female, DOB - [birth year], [id number]  Admit date - 09/22/2015   Admitting Physician Etta Quill, DO  Outpatient Primary MD for the patient is Philis Fendt, MD  LOS - 1  Chief Complaint  Patient presents with  . Leg Swelling  . Arm Swelling       Brief Narrative     Elizabeth Pugh is a 82 y.o. female with medical history significant of Chronic diastolic CHF, DM, HTN. Patient presents to the ED with BLE edema that has been slowly worsening since discharge from the hospital on the first of the month. Patient was taken off of her lasix at the time of admission to the hospital and it was not restarted due to pre-renal azotemia at that time. She has associated SOB. 80% O2 on room air, 94% on 2L. Patient treated for bronchitis and PNA 3 weeks ago.  ED Course: Found to have mild CHF and pulmonary edema on imaging studies in ED.   Subjective:    Elizabeth Pugh today has, No headache, No chest pain, No abdominal pain - No Nausea, No new weakness tingling or numbness, No Cough - Improved shortness of breath.   Assessment  & Plan :     1. Acute on chronic diastolic CHF last EF on recent echo 60%. Continue Coreg, switched to IV Lasix 40 mg 3 times a day, monitor intake and output and daily weights, monitor BMP. Cardiology consulted. Stop Norvasc due to massive edema. 5 lit negative. Consulted Dr Terrence Dupont.  Filed Weights   09/22/15 2239 09/24/15 0543  Weight: 161.072 kg (355 lb 1.6 oz) 138.801 kg (306 lb)   2. Hypertension. Currently on Coreg and diuretic will monitor. Norvasc stopped due to edema.  3. Morbid obesity with generalized  deconditioning and bedbound status. Follow with PCP for weight loss, PT eval, may require SNF placement.  4. Restless leg syndrome. On Requip.   5. GERD. On PPI continue.   6. Underlying COPD. Compensated no wheezing, oxygen and nebulizer treatments as needed.  7. Diabetic neuropathy. On Lyrica to continue.   8. Hyperactive bladder. On doxepin.   9. Chronic atrial fibrillation. [place] vasc 2 score of greater than 4. On Coreg and liquids.  10. History of seizures. On Keppra.   11. DM type II. Currently on Lantus and sliding scale will monitor.  Lab Results  Component Value Date   HGBA1C 9.6* 08/29/2015   CBG (last 3)   Recent Labs  09/23/15 1714 09/23/15 2149 09/24/15 0540  GLUCAP 154* 170* 152*     Code Status : Elizabeth Pugh  Family Communication  :  None present  Disposition Plan  :  To be decided  Consults  : Cardiology  Procedures  :    Upper extremity venous duplex. No DVT SVT.  DVT Prophylaxis  :  Eliquis  Lab Results  Component Value Date   PLT 106* 09/22/2015    Inpatient Medications  Scheduled Meds: . apixaban  5 mg Oral BID  . carvedilol  40 mg Oral Daily  . doxepin  25 mg Oral QHS  . ferrous sulfate  325 mg Oral Q breakfast  . furosemide  40 mg Intravenous TID  . guaiFENesin  1,200 mg Oral BID  . insulin aspart  0-15 Units Subcutaneous TID WC  . insulin glargine  15 Units Subcutaneous QHS  . levETIRAcetam  500 mg Oral BID  . pantoprazole  40 mg Oral Q0600  . potassium chloride  20 mEq Oral Daily  . pregabalin  75 mg Oral BID  . rOPINIRole  2 mg Oral QID  . sodium chloride flush  3 mL Intravenous Q12H   Continuous Infusions:  PRN Meds:.sodium chloride, acetaminophen, albuterol, ALPRAZolam, diclofenac sodium, hydrOXYzine, ipratropium-albuterol, ondansetron (ZOFRAN) IV, sodium chloride flush  Antibiotics  :    Anti-infectives    None         Objective:   Filed Vitals:   09/24/15 0304 09/24/15 0543 09/24/15 0900 09/24/15 0924  BP:  115/64 128/74 108/58   Pulse: 80 74 87   Temp: 98.2 F (36.8 C) 98.5 F (36.9 C) 98.1 F (36.7 C)   TempSrc: Oral Oral Oral   Resp: 18 20 20    Height:      Weight:  138.801 kg (306 lb)    SpO2: 96% 97%  100%    Wt Readings from Last 3 Encounters:  09/24/15 138.801 kg (306 lb)  01/28/15 122.471 kg (270 lb)  01/25/15 123.5 kg (272 lb 4.3 oz)     Intake/Output Summary (Last 24 hours) at 09/24/15 0936 Last data filed at 09/24/15 0525  Gross per 24 hour  Intake    600 ml  Output   3225 ml  Net  -2625 ml     Physical Exam  Awake Alert, Oriented X 3, No new F.N deficits, Normal affect Elizabeth Pugh.AT,PERRAL Supple Neck,No JVD, No cervical lymphadenopathy appriciated.  Symmetrical Chest wall movement, Good air movement bilaterally, CTAB RRR,No Gallops,Rubs or new Murmurs, No Parasternal Heave +ve B.Sounds, Abd Soft, No tenderness, No organomegaly appriciated, No rebound - guarding or rigidity. No Cyanosis, Clubbing 2 +edema, No new Rash or bruise       Data Review:    CBC  Recent Labs Lab 09/22/15 1513  WBC 3.4*  HGB 12.2  HCT 41.7  PLT 106*  MCV 99.5  MCH 29.1  MCHC 29.3*  RDW 15.3  LYMPHSABS 1.1  MONOABS 0.4  EOSABS 0.1  BASOSABS 0.0    Chemistries   Recent Labs Lab 09/22/15 1513 09/23/15 0323  09/24/15 0233  NA 141 141 139  K 4.5 4.3 4.3  CL 106 102 96*  CO2 29 34* 38*  GLUCOSE 196* 178* 177*  BUN 23* 20 17  CREATININE 0.82 0.79 0.86  CALCIUM 9.0 8.9 8.3*  AST 12*  --   --   ALT 8*  --   --   ALKPHOS 53  --   --   BILITOT 0.7  --   --    ------------------------------------------------------------------------------------------------------------------ No results for input(s): CHOL, HDL, LDLCALC, TRIG, CHOLHDL, LDLDIRECT in the last 72 hours.  Lab Results  Component Value Date   HGBA1C 9.6* 08/29/2015   ------------------------------------------------------------------------------------------------------------------ No results for input(s): TSH,  T4TOTAL, T3FREE, THYROIDAB in the last 72 hours.  Invalid input(s): FREET3 ------------------------------------------------------------------------------------------------------------------ No results for input(s): VITAMINB12, FOLATE, FERRITIN, TIBC, IRON, RETICCTPCT in the last 72 hours.  Coagulation profile No results for input(s): INR, PROTIME in the last 168 hours.  No results for input(s): DDIMER in the last 72 hours.  Cardiac Enzymes  Recent Labs Lab 09/22/15 1513  TROPONINI <0.03   ------------------------------------------------------------------------------------------------------------------    Component Value Date/Time   BNP 173.0* 09/22/2015 1513    Micro Results No results found for this or any previous visit (from the past 240 hour(s)).  Radiology Reports Dg Chest 2 View  09/22/2015  CLINICAL DATA:  82 year old with acute onset of shortness of breath yesterday. Current history of hypertension, diabetes and CHF. Former smoker. EXAM: CHEST  2 VIEW COMPARISON:  08/29/2015 and earlier, including CTA chest 01/27/2015. FINDINGS: AP semi-erect and lateral images were obtained. Cardiac silhouette markedly enlarged, unchanged. Thoracic aorta tortuous, unchanged. Hilar and mediastinal contours otherwise unremarkable. Loculated pleural effusion posteriorly on the right with extension into the major fissure, markedly increased in size since September, 2016 where it was not visible on the x-ray but was present on the CT. No visible left pleural effusion. Lungs otherwise clear. Mild pulmonary venous hypertension without overt edema. IMPRESSION: Large loculated right pleural effusion with extension into the major fissure, increased in size since September, 2016. Stable cardiomegaly without overt edema. Electronically Signed   By: Evangeline Dakin M.D.   On: 09/22/2015 16:01   Ct Angio Chest Pe W/cm &/or Wo Cm  09/22/2015  CLINICAL DATA:  82 year old female with shortness of breath and  right-sided pleural effusion EXAM: CT ANGIOGRAPHY CHEST WITH CONTRAST TECHNIQUE: Multidetector CT imaging of the chest was performed using the standard protocol during bolus administration of intravenous contrast. Multiplanar CT image reconstructions and MIPs were obtained to evaluate the vascular anatomy. CONTRAST:  85 mL Isovue 370 COMPARISON:  Chest x-ray obtained concurrently; most recent chest CT 01/27/2015 FINDINGS: Mediastinum: Unremarkable CT appearance of the thyroid gland. No suspicious mediastinal or hilar adenopathy. No soft tissue mediastinal mass. The thoracic esophagus is unremarkable. Heart/Vascular: Adequate opacification of the pulmonary arteries to the proximal subsegmental level. No evidence of acute pulmonary embolus. Conventional 3 vessel arch anatomy. No aneurysm. Cardiomegaly. Right heart enlargement. No pericardial effusion. Atherosclerotic calcifications along the coronary arteries. Lungs/Pleura: Small right and trace left layering pleural effusions. Mild interlobular septal thickening and diffuse mild ground-glass attenuation airspace opacity most consistent with mild interstitial edema. Passive atelectasis in the lower lobes. Mild bronchial wall thickening diffusely. Bones/Soft Tissues: No acute fracture or aggressive appearing lytic or blastic osseous lesion. Upper Abdomen: Stable gastric antral lipoma. Visualized upper abdominal organs are unremarkable. Review of the MIP images confirms the above findings. IMPRESSION: 1. Negative for acute pulmonary embolus. 2. Mild pulmonary edema. In the setting  of cardiomegaly this is concerning for mild congestive heart failure. 3. Small right and trace left layering pleural effusions with associated bibasilar atelectasis. 4. Atherosclerosis including coronary artery calcifications. Electronically Signed   By: Jacqulynn Cadet M.D.   On: 09/22/2015 19:26   Dg Chest Port 1 View  08/29/2015  CLINICAL DATA:  Shortness of breath today History:   Diabetes Hypertension GERD EXAM: PORTABLE CHEST 1 VIEW COMPARISON:  01/27/2015 FINDINGS: Cardiac silhouette mildly enlarged. No mediastinal or hilar masses or evidence of adenopathy. There mild prominence of the bronchovascular markings bilaterally accentuated by the AP technique and patient's body habitus. No overt pulmonary edema. No evidence of pneumonia. No convincing pleural effusion and no pneumothorax. Bony thorax is demineralized. There are changes prior shoulder surgery the left, stable. IMPRESSION: No acute cardiopulmonary disease. Electronically Signed   By: Lajean Manes M.D.   On: 08/29/2015 12:33    Time Spent in minutes  30   Royer Cristobal K M.D on 09/24/2015 at 9:36 AM  Between 7am to 7pm - Pager - [phone]  After 7pm go to [email or web address] - password [email or web address]  [facility] -  Office  [phone]

## 2015-09-25 DIAGNOSIS — I10 Essential (primary) hypertension: Secondary | ICD-10-CM

## 2015-09-25 DIAGNOSIS — Z794 Long term (current) use of insulin: Secondary | ICD-10-CM

## 2015-09-25 DIAGNOSIS — I482 Chronic atrial fibrillation: Secondary | ICD-10-CM

## 2015-09-25 LAB — CBC
HEMATOCRIT: 38 % (ref 36.0–46.0)
HEMOGLOBIN: 11.3 g/dL — AB (ref 12.0–15.0)
MCH: 28.8 pg (ref 26.0–34.0)
MCHC: 29.7 g/dL — AB (ref 30.0–36.0)
MCV: 96.9 fL (ref 78.0–100.0)
Platelets: 120 10*3/uL — ABNORMAL LOW (ref 150–400)
RBC: 3.92 MIL/uL (ref 3.87–5.11)
RDW: 14.8 % (ref 11.5–15.5)
WBC: 4.5 10*3/uL (ref 4.0–10.5)

## 2015-09-25 LAB — GLUCOSE, CAPILLARY
GLUCOSE-CAPILLARY: 176 mg/dL — AB (ref 65–99)
GLUCOSE-CAPILLARY: 188 mg/dL — AB (ref 65–99)
GLUCOSE-CAPILLARY: 164 mg/dL — AB (ref 65–99)
Glucose-Capillary: 190 mg/dL — ABNORMAL HIGH (ref 65–99)

## 2015-09-25 LAB — BASIC METABOLIC PANEL
ANION GAP: 7 (ref 5–15)
BUN: 20 mg/dL (ref 6–20)
CHLORIDE: 94 mmol/L — AB (ref 101–111)
CO2: 37 mmol/L — ABNORMAL HIGH (ref 22–32)
Calcium: 8.3 mg/dL — ABNORMAL LOW (ref 8.9–10.3)
Creatinine, Ser: 0.99 mg/dL (ref 0.44–1.00)
GFR calc Af Amer: 58 mL/min — ABNORMAL LOW (ref >60)
GFR, EST NON AFRICAN AMERICAN: 50 mL/min — AB (ref >60)
Glucose, Bld: 177 mg/dL — ABNORMAL HIGH (ref 65–99)
POTASSIUM: 4.3 mmol/L (ref 3.5–5.1)
SODIUM: 138 mmol/L (ref 135–145)

## 2015-09-25 LAB — BRAIN NATRIURETIC PEPTIDE: B Natriuretic Peptide: 131.7 pg/mL — ABNORMAL HIGH (ref 0.0–100.0)

## 2015-09-25 LAB — MAGNESIUM: Magnesium: 1.6 mg/dL — ABNORMAL LOW (ref 1.7–2.4)

## 2015-09-25 MED ORDER — CARVEDILOL PHOSPHATE ER 20 MG PO CP24
20.0000 mg | ORAL_CAPSULE | Freq: Every day | ORAL | Status: DC
  Administered 2015-09-26 – 2015-09-30 (×5): 20 mg via ORAL
  Filled 2015-09-25 (×5): qty 1

## 2015-09-25 MED ORDER — ACETAZOLAMIDE 250 MG PO TABS
250.0000 mg | ORAL_TABLET | Freq: Every day | ORAL | Status: DC
  Administered 2015-09-25 – 2015-10-01 (×7): 250 mg via ORAL
  Filled 2015-09-25 (×7): qty 1

## 2015-09-25 MED ORDER — MAGNESIUM SULFATE 2 GM/50ML IV SOLN
2.0000 g | Freq: Once | INTRAVENOUS | Status: AC
  Administered 2015-09-25: 2 g via INTRAVENOUS
  Filled 2015-09-25: qty 50

## 2015-09-25 NOTE — Care Management Important Message (Signed)
Important Message  Patient Details  Name: Elizabeth Pugh MRN: [id number] Date of Birth: [birth year]   Medicare Important Message Given:  Yes    Loann Quill 09/25/2015, 11:20 AM

## 2015-09-25 NOTE — NC FL2 (Signed)
[place] LEVEL OF CARE SCREENING TOOL     IDENTIFICATION  Patient Name: Elizabeth Pugh Birthdate: 10/10/1926 Sex: female Admission Date (Current Location): 09/22/2015  [facility] and [place] Number:  Herbalist and Address:  [facility] [place] [place] [address] [place], [place] [address]      Provider Number: [id number]  Attending Physician Name and Address:  Lavina Hamman, MD  Relative Name and Phone Number:       Current Level of Care: Hospital Recommended Level of Care: [place] Prior Approval Number:    Date Approved/Denied:   PASRR Number: [id number]  Discharge Plan: SNF    Current Diagnoses: Patient Active Problem List   Diagnosis Date Noted  . Acute on chronic diastolic (congestive) heart failure ([place]) 09/22/2015  . Acute kidney injury ([place]) 09/02/2015  . Lobar pneumonia due to unspecified organism 09/02/2015  . Bilateral leg edema   . Acute respiratory failure with hypoxia ([place]) 08/29/2015  . Acute bronchitis 08/29/2015  . Acute hyperkalemia 08/29/2015  . History of pulmonary embolism 01/27/2015  . Chest pain at rest 01/27/2015  . Shortness of breath at rest 01/27/2015  . Chronic diastolic heart failure ([place]) 01/27/2015  . Thrombocytopenia ([place]) 01/27/2015  . Abnormal findings on radiological examination of gastrointestinal tract 01/27/2015  . Chronic atrial fibrillation ([place])   . Seizure disorder ([place])   . Anxiety   . Anemia   . Restless leg syndrome   . Acute delirium 04/12/2011  . DM (diabetes mellitus), type 2, uncontrolled with complications ([place]) 0000000  . CVA, old, hemiparesis ([place]) 03/25/2011  . Hypertension 03/25/2011    Orientation RESPIRATION BLADDER Height & Weight     Self, Time, Situation, Place  O2 (2.5 L [place]) Indwelling catheter Weight: (!) 349 lb 8 oz (158.532 kg) Height:  5\' 7"  (170.2 cm) (pt states)  BEHAVIORAL SYMPTOMS/MOOD NEUROLOGICAL BOWEL NUTRITION STATUS   Incontinent Diet (carb modified)  AMBULATORY STATUS COMMUNICATION OF NEEDS Skin   Total Care Verbally (difficult to understand) Normal                       Personal Care Assistance Level of Assistance  Bathing, Dressing Bathing Assistance: Maximum assistance   Dressing Assistance: Maximum assistance     Functional Limitations Info             SPECIAL CARE FACTORS FREQUENCY  PT (By licensed PT), OT (By licensed OT)     PT Frequency: 5/wk OT Frequency: 5/wk            Contractures      Additional Factors Info  Code Status, Allergies, Insulin Sliding Scale Code Status Info: DNR Allergies Info: Erythromycin, Penicillins, Zithromax   Insulin Sliding Scale Info: 3/day       Current Medications (09/25/2015):  This is the current hospital active medication list Current Facility-Administered Medications  Medication Dose Route Frequency Provider Last Rate Last Dose  . 0.9 %  sodium chloride infusion  250 mL Intravenous PRN Etta Quill, DO      . acetaminophen (TYLENOL) tablet 650 mg  650 mg Oral Q4H PRN Etta Quill, DO      . albuterol (PROVENTIL) (2.5 MG/3ML) 0.083% nebulizer solution 3 mL  3 mL Inhalation Q6H PRN Etta Quill, DO      . ALPRAZolam Duanne Moron) tablet 0.25 mg  0.25 mg Oral BID PRN Etta Quill, DO      . apixaban Arne Cleveland)  tablet 5 mg  5 mg Oral BID Thurnell Lose, MD   5 mg at 09/25/15 1021  . carvedilol (COREG CR) 24 hr capsule 40 mg  40 mg Oral Daily Etta Quill, DO   40 mg at 09/25/15 1021  . diclofenac sodium (VOLTAREN) 1 % transdermal gel 1 application  1 application Topical QID PRN Etta Quill, DO      . ferrous sulfate tablet 325 mg  325 mg Oral Q breakfast Etta Quill, DO   325 mg at 09/25/15 [id number]  . furosemide (LASIX) injection 40 mg  40 mg Intravenous TID Thurnell Lose, MD   40 mg at 09/25/15 1021  . guaiFENesin (MUCINEX) 12 hr tablet 1,200 mg  1,200 mg Oral BID Etta Quill, DO   1,200 mg at 09/25/15 1021  .  hydrOXYzine (ATARAX/VISTARIL) tablet 25 mg  25 mg Oral BID PRN Etta Quill, DO      . insulin aspart (novoLOG) injection 0-15 Units  0-15 Units Subcutaneous TID WC Thurnell Lose, MD   3 Units at 09/25/15 0618  . insulin glargine (LANTUS) injection 15 Units  15 Units Subcutaneous QHS Etta Quill, DO   15 Units at 09/24/15 2124  . ipratropium-albuterol (DUONEB) 0.5-2.5 (3) MG/3ML nebulizer solution 3 mL  3 mL Nebulization Q6H PRN Etta Quill, DO      . levETIRAcetam (KEPPRA) tablet 500 mg  500 mg Oral BID Etta Quill, DO   500 mg at 09/25/15 1021  . magnesium sulfate IVPB 2 g 50 mL  2 g Intravenous Once Lavina Hamman, MD      . ondansetron [facility]) injection 4 mg  4 mg Intravenous Q6H PRN Etta Quill, DO      . pantoprazole (PROTONIX) EC tablet 40 mg  40 mg Oral Q0600 Etta Quill, DO   40 mg at 09/25/15 [id number]  . potassium chloride SA (K-DUR,KLOR-CON) CR tablet 20 mEq  20 mEq Oral Daily Thurnell Lose, MD   20 mEq at 09/25/15 1021  . pregabalin (LYRICA) capsule 75 mg  75 mg Oral BID Etta Quill, DO   75 mg at 09/25/15 1021  . rOPINIRole (REQUIP) tablet 2 mg  2 mg Oral QID Etta Quill, DO   2 mg at 09/25/15 1021  . sodium chloride flush (NS) 0.9 % injection 3 mL  3 mL Intravenous Q12H Etta Quill, DO   3 mL at 09/25/15 1000  . sodium chloride flush (NS) 0.9 % injection 3 mL  3 mL Intravenous PRN Etta Quill, DO         Discharge Medications: Please see discharge summary for a list of discharge medications.  Relevant Imaging Results:  Relevant Lab Results:   Additional Information SS#: [id number]  Cranford Mon, [place]

## 2015-09-25 NOTE — Progress Notes (Signed)
Subjective:  Patient denies any chest pain.  States breathing and leg swelling is slowly improving  Objective:  Vital Signs in the last 24 hours: Temp:  [98.2 F (36.8 C)-98.5 F (36.9 C)] 98.2 F (36.8 C) (05/24 1222) Pulse Rate:  [70-92] 92 (05/24 1222) Resp:  [18-20] 20 (05/24 1222) BP: (95-129)/(51-71) 95/59 mmHg (05/24 1222) SpO2:  [96 %-98 %] 98 % (05/24 1222) Weight:  [158.532 kg (349 lb 8 oz)] 158.532 kg (349 lb 8 oz) (05/24 0405)  Intake/Output from previous day: 05/23 0701 - 05/24 0700 In: 250 [P.O.:250] Out: 2450 [Urine:2450] Intake/Output from this shift: Total I/O In: 480 [P.O.:480] Out: 400 [Urine:400]  Physical Exam: Neck: no adenopathy, no carotid bruit, no JVD and supple, symmetrical, trachea midline Lungs: decreased breath sounds at bases Heart: irregularly irregular rhythm, S1, S2 normal and soft systolic murmur noted Abdomen: soft, non-tender; bowel sounds normal; no masses,  no organomegaly Extremities: no clubbing, cyanosis 3+ edema noted  Lab Results:  Recent Labs  09/22/15 1513 09/25/15 0440  WBC 3.4* 4.5  HGB 12.2 11.3*  PLT 106* 120*    Recent Labs  09/24/15 0233 09/25/15 0440  NA 139 138  K 4.3 4.3  CL 96* 94*  CO2 38* 37*  GLUCOSE 177* 177*  BUN 17 20  CREATININE 0.86 0.99    Recent Labs  09/22/15 1513  TROPONINI <0.03   Hepatic Function Panel  Recent Labs  09/22/15 1513  PROT 6.0*  ALBUMIN 2.8*  AST 12*  ALT 8*  ALKPHOS 53  BILITOT 0.7   No results for input(s): CHOL in the last 72 hours. No results for input(s): PROTIME in the last 72 hours.  Imaging: Imaging results have been reviewed and No results found.  Cardiac Studies:  Assessment/Plan:  Resolving decompensated congestive heart failure secondary to preserved LV systolic function. Hypertension. Type 2 diabetes mellitus Chronic atrial fibrillation on chronic anticoagulation. Hyperlipidemia. Morbid obesity. History of CVA and TIA in the  past. Seizure disorder. Restless leg syndrome. 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 Degenerative joint disease. Seizure disorder. Hypoalbuminemia Hypotension. Plan Reduce carvedilol to 20 mg daily Acetazolamide 250 mg daily  LOS: 2 days    Charolette Forward 09/25/2015, 2:07 PM

## 2015-09-25 NOTE — Progress Notes (Signed)
[facility] Progress Note  Patient: Elizabeth Pugh [id number]   PCP: Philis Fendt, MD DOB: [birth year]   DOA: 09/22/2015   DOS: 09/25/2015   Date of Service: the patient was seen and examined on 09/25/2015  Subjective: Patient mentions her breathing is better denies having any chest pain or abdominal pain no nausea no vomiting. Continues to have leg swelling no leg tenderness Nutrition: Tolerating oral diet  Brief hospital course: Patient with past medical history of chronic diastolic CHF, diabetes mellitus, hypertension was admitted on 09/22/2015, with complaint of bilateral leg swelling, was found to have Acute on chronic diastolic CHF. Patient was started on IV Lasix as well as cartilage was consulted. Currently further plan is continue further aggressive diuresis with IV Lasix.  Assessment and Plan: 1. Acute on chronic diastolic (congestive) heart failure ([place]) Echo in September 16 shows EF of 0000000 with diastolic dysfunction. Baseline weight 270 pound, admission weight 355 pounds. Today 349 pounds. -6.9 L. Continue Coreg home dose. IV Lasix 40 mg 3 times a day. Holding Micardis. Daily weight and strict ins and outs Continue unna boot  2. Essential hypertension. Continue Coreg. Stop Norvasc. The patient remains persistently hypotensive may need to reduce the dose of Coreg and also change the formulation from 24-hour controlled release 40 mg daily to 6.25 mg every 12 hours.  3. Morbid obesity. PTOT eval will need SNF on discharge.  4. Restless leg syndrome. Diabetic neuropathy Increased sleepiness per patient. Continue Requip. Continue Lyrica. Discontinue doxepin.  5. GERD. Continue PPI.  6. History of seizures. Continue Keppra.  7. Type 2 diabetes mellitus. Hemoglobin A1c April 17 9.6. Continue sliding scale insulin. Continue Lantus 15 units daily at bedtime. Blood sugars adequately controlled.  8. Atrial fibrillation CHA2DS2-VASc Score 8 Patient is  currently rate controlled We'll continue Apixaban per pharmacy Continue carvedilol   Pain management: When necessary Tylenol Activity: SNF per physical therapy Bowel regimen: last BM 09/23/2015 Diet: Cardiac diet carb modified DVT Prophylaxis: on therapeutic anticoagulation.  Advance goals of care discussion: DNR/DNI  Family Communication: family was present at bedside, at the time of interview. The pt provided permission to discuss medical plan with the family. Opportunity was given to ask question and all questions were answered satisfactorily.   Disposition:  Discharge to SNF, pending improvement in volume status Expected discharge date: May 27  Consultants: Cardiology Procedures: None  Antibiotics: Anti-infectives    None        Intake/Output Summary (Last 24 hours) at 09/25/15 1303 Last data filed at 09/25/15 1048  Gross per 24 hour  Intake    390 ml  Output   2575 ml  Net  -2185 ml   Filed Weights   09/22/15 2239 09/24/15 0543 09/25/15 0405  Weight: 161.072 kg (355 lb 1.6 oz) 138.801 kg (306 lb) 158.532 kg (349 lb 8 oz)    Objective: Physical Exam: Filed Vitals:   09/24/15 1133 09/24/15 2335 09/25/15 0405 09/25/15 1222  BP: 125/61 129/71 122/51 95/59  Pulse: 83 70 83 92  Temp: 98.3 F (36.8 C) 98.5 F (36.9 C) 98.5 F (36.9 C) 98.2 F (36.8 C)  TempSrc: Oral Oral Oral Oral  Resp: 18 18 18 20   Height:      Weight:   158.532 kg (349 lb 8 oz)   SpO2: 96% 97% 96% 98%    General: Alert, Awake and Oriented to Time, Place and Person. Appear in moderate distress Eyes: PERRL, Conjunctiva normal ENT: Oral Mucosa clear moist. Neck: difficult  to assess  JVD, no Abnormal Mass Or lumps Cardiovascular: S1 and S2 Present, aortic systolic  Murmur, Peripheral Pulses Present Respiratory: Bilateral Air entry equal and Decreased, bilateral  Crackles, no wheezes Abdomen: Bowel Sound present, Soft and no tenderness Skin: no redness, no Rash  Extremities: bilateral   Pedal edema, no calf tenderness Neurologic: Grossly no focal neuro deficit. Bilaterally Equal motor strength  Data Reviewed: CBC:  Recent Labs Lab 09/22/15 1513 09/25/15 0440  WBC 3.4* 4.5  NEUTROABS 1.9  --   HGB 12.2 11.3*  HCT 41.7 38.0  MCV 99.5 96.9  PLT 106* 123456*   Basic Metabolic Panel:  Recent Labs Lab 09/22/15 1513 09/23/15 0323 09/24/15 0233 09/25/15 0440  NA 141 141 139 138  K 4.5 4.3 4.3 4.3  CL 106 102 96* 94*  CO2 29 34* 38* 37*  GLUCOSE 196* 178* 177* 177*  BUN 23* 20 17 20   CREATININE 0.82 0.79 0.86 0.99  CALCIUM 9.0 8.9 8.3* 8.3*  MG  --   --   --  1.6*    Liver Function Tests:  Recent Labs Lab 09/22/15 1513  AST 12*  ALT 8*  ALKPHOS 53  BILITOT 0.7  PROT 6.0*  ALBUMIN 2.8*   No results for input(s): LIPASE, AMYLASE in the last 168 hours. No results for input(s): AMMONIA in the last 168 hours. Coagulation Profile: No results for input(s): INR, PROTIME in the last 168 hours. Cardiac Enzymes:  Recent Labs Lab 09/22/15 1513  TROPONINI <0.03   BNP (last 3 results) No results for input(s): PROBNP in the last 8760 hours.  CBG:  Recent Labs Lab 09/24/15 1130 09/24/15 1638 09/24/15 2117 09/25/15 0538 09/25/15 1136  GLUCAP 164* 233* 247* 176* 188*    Studies: No results found.   Scheduled Meds: . apixaban  5 mg Oral BID  . carvedilol  40 mg Oral Daily  . ferrous sulfate  325 mg Oral Q breakfast  . furosemide  40 mg Intravenous TID  . guaiFENesin  1,200 mg Oral BID  . insulin aspart  0-15 Units Subcutaneous TID WC  . insulin glargine  15 Units Subcutaneous QHS  . levETIRAcetam  500 mg Oral BID  . magnesium sulfate 1 - 4 g bolus IVPB  2 g Intravenous Once  . pantoprazole  40 mg Oral Q0600  . potassium chloride  20 mEq Oral Daily  . pregabalin  75 mg Oral BID  . rOPINIRole  2 mg Oral QID  . sodium chloride flush  3 mL Intravenous Q12H   Continuous Infusions:  PRN Meds: sodium chloride, acetaminophen, albuterol,  ALPRAZolam, diclofenac sodium, hydrOXYzine, ipratropium-albuterol, ondansetron (ZOFRAN) IV, sodium chloride flush  Time spent: 30 minutes  Author: Berle Mull, MD [facility] Pager: [phone] 09/25/2015 1:03 PM  If 7PM-7AM, please contact night-coverage at [email or web address], password [facility]

## 2015-09-26 ENCOUNTER — Inpatient Hospital Stay (HOSPITAL_COMMUNITY): Payer: Medicare Other

## 2015-09-26 LAB — BLOOD GAS, ARTERIAL
ACID-BASE EXCESS: 15 mmol/L — AB (ref 0.0–2.0)
Bicarbonate: 41.5 mEq/L — ABNORMAL HIGH (ref 20.0–24.0)
Drawn by: 270221
O2 Content: 2.5 L/min
O2 SAT: 97.6 %
PATIENT TEMPERATURE: 98.6
PCO2 ART: 80.6 mmHg — AB (ref 35.0–45.0)
TCO2: 44 mmol/L (ref 0–100)
pH, Arterial: 7.331 — ABNORMAL LOW (ref 7.350–7.450)
pO2, Arterial: 104 mmHg — ABNORMAL HIGH (ref 80.0–100.0)

## 2015-09-26 LAB — CBC WITH DIFFERENTIAL/PLATELET
BASOS PCT: 0 %
Basophils Absolute: 0 10*3/uL (ref 0.0–0.1)
EOS ABS: 0.1 10*3/uL (ref 0.0–0.7)
Eosinophils Relative: 2 %
HCT: 37.1 % (ref 36.0–46.0)
HEMOGLOBIN: 11.5 g/dL — AB (ref 12.0–15.0)
Lymphocytes Relative: 24 %
Lymphs Abs: 1 10*3/uL (ref 0.7–4.0)
MCH: 29.5 pg (ref 26.0–34.0)
MCHC: 31 g/dL (ref 30.0–36.0)
MCV: 95.1 fL (ref 78.0–100.0)
MONOS PCT: 11 %
Monocytes Absolute: 0.4 10*3/uL (ref 0.1–1.0)
NEUTROS PCT: 63 %
Neutro Abs: 2.5 10*3/uL (ref 1.7–7.7)
Platelets: 118 10*3/uL — ABNORMAL LOW (ref 150–400)
RBC: 3.9 MIL/uL (ref 3.87–5.11)
RDW: 14.6 % (ref 11.5–15.5)
WBC: 4 10*3/uL (ref 4.0–10.5)

## 2015-09-26 LAB — TSH: TSH: 0.554 u[IU]/mL (ref 0.350–4.500)

## 2015-09-26 LAB — BASIC METABOLIC PANEL
Anion gap: 7 (ref 5–15)
BUN: 19 mg/dL (ref 6–20)
CALCIUM: 8.5 mg/dL — AB (ref 8.9–10.3)
CO2: 38 mmol/L — AB (ref 22–32)
CREATININE: 0.9 mg/dL (ref 0.44–1.00)
Chloride: 93 mmol/L — ABNORMAL LOW (ref 101–111)
GFR, EST NON AFRICAN AMERICAN: 56 mL/min — AB (ref >60)
Glucose, Bld: 112 mg/dL — ABNORMAL HIGH (ref 65–99)
Potassium: 4 mmol/L (ref 3.5–5.1)
SODIUM: 138 mmol/L (ref 135–145)

## 2015-09-26 LAB — GLUCOSE, CAPILLARY
GLUCOSE-CAPILLARY: 155 mg/dL — AB (ref 65–99)
GLUCOSE-CAPILLARY: 166 mg/dL — AB (ref 65–99)
GLUCOSE-CAPILLARY: 177 mg/dL — AB (ref 65–99)
Glucose-Capillary: 166 mg/dL — ABNORMAL HIGH (ref 65–99)

## 2015-09-26 LAB — AMMONIA: Ammonia: 36 umol/L — ABNORMAL HIGH (ref 9–35)

## 2015-09-26 LAB — VITAMIN B12: VITAMIN B 12: 408 pg/mL (ref 180–914)

## 2015-09-26 LAB — MAGNESIUM: MAGNESIUM: 2 mg/dL (ref 1.7–2.4)

## 2015-09-26 LAB — FOLATE: Folate: 14.8 ng/mL (ref >5.9)

## 2015-09-26 MED ORDER — DEXTROSE 5 % IV SOLN: 500.0000 mg | Freq: Once | INTRAVENOUS | Status: DC

## 2015-09-26 MED ORDER — SENNOSIDES-DOCUSATE SODIUM 8.6-50 MG PO TABS: 1.0000 | ORAL_TABLET | Freq: Two times a day (BID) | ORAL | Status: DC

## 2015-09-26 MED ORDER — ACETAZOLAMIDE SODIUM 500 MG IJ SOLR
500.0000 mg | Freq: Once | INTRAMUSCULAR | Status: AC
  Administered 2015-09-26: 500 mg via INTRAVENOUS
  Filled 2015-09-26: qty 500

## 2015-09-26 MED ORDER — ROPINIROLE HCL 1 MG PO TABS
2.0000 mg | ORAL_TABLET | Freq: Three times a day (TID) | ORAL | Status: DC
  Administered 2015-09-26 – 2015-10-01 (×15): 2 mg via ORAL
  Filled 2015-09-26 (×16): qty 2

## 2015-09-26 MED ORDER — METOLAZONE 2.5 MG PO TABS
2.5000 mg | ORAL_TABLET | Freq: Every day | ORAL | Status: DC
  Administered 2015-09-26 – 2015-09-27 (×2): 2.5 mg via ORAL
  Filled 2015-09-26 (×2): qty 1

## 2015-09-26 MED ORDER — POLYETHYLENE GLYCOL 3350 17 G PO PACK
17.0000 g | PACK | Freq: Every day | ORAL | Status: DC
  Administered 2015-09-26 – 2015-09-29 (×4): 17 g via ORAL
  Filled 2015-09-26 (×4): qty 1

## 2015-09-26 MED ORDER — FUROSEMIDE 40 MG PO TABS
40.0000 mg | ORAL_TABLET | Freq: Two times a day (BID) | ORAL | Status: DC
  Administered 2015-09-26 – 2015-09-28 (×5): 40 mg via ORAL
  Filled 2015-09-26 (×5): qty 1

## 2015-09-26 NOTE — Clinical Social Work Note (Addendum)
CSW contacted patient's daughter and extended bed offers. She will be at the hospital around 3:30 pm today. CSW will meet with her then to discuss which facility to choose. Anticipated discharge date is 5/27, per MD note.  Dayton Scrape, CSW [phone]  CSW met with patient's daughter. Patient asleep. Discussed facilities that had extended bed offers. Of those, patient's daughter chose [facility]. Patient has been to [name] in the past. They have not yet said yes or no to referral. CSW left voicemail for admissions coordinator with contact information. Also sent therapy notes.  Dayton Scrape, [place] [phone]  CSW spoke with admissions coordinator. She will review past records to make decision and let CSW know in the morning.  Dayton Scrape, [place]

## 2015-09-26 NOTE — Progress Notes (Addendum)
[facility] Progress Note  Patient: Elizabeth Pugh [id number]   PCP: Philis Fendt, MD DOB: Feb 26, 1927   DOA: 09/22/2015   DOS: 09/26/2015   Date of Service: the patient was seen and examined on 09/26/2015  Subjective: Denies any current complaint no acute events overnight as well. Blood pressure improved in the afternoon. Continues to have shortness of breath as well as leg swelling. Nutrition: Tolerating oral diet  Brief hospital course: Patient with past medical history of chronic diastolic CHF, diabetes mellitus, hypertension was admitted on 09/22/2015, with complaint of bilateral leg swelling, was found to have Acute on chronic diastolic CHF. Patient was started on IV Lasix as well as cardiology was consulted. Currently further plan is continue further aggressive diuresis with IV Lasix.  Assessment and Plan: 1. Acute on chronic diastolic (congestive) heart failure ([place]) Echo in September 16 shows EF of 0000000 with diastolic dysfunction. Baseline weight 270 pound, admission weight 355 pounds. Today 341 pounds. -9 L. Continue Coreg , dose reduced by cardiology 20 mg yesterday IV Lasix 40 mg 3 times a day. Holding Micardis. Daily weight and strict ins and outs Continue unna boot  2. Essential hypertension. Continue Coreg. Dose reduced by cardiology to 20 mg Stop Norvasc.  3. Morbid obesity. PTOT eval recommends patient will need SNF on discharge.  4. Restless leg syndrome. Diabetic neuropathy Acute encephalopathy Increased sleepiness per patient and family Untreated sleep apnea.  Continue Requip. Continue Lyrica. Discontinue doxepin. No evidence of focal deficit on examination. We will check CT head, ABG, ammonia, 0000000, folic acid, TSH. Encephalopathy is likely medication induced as well as from untreated sleep apnea as her ABG in September 2016 was showing chronic compensated hypercarbia.  5. GERD. Continue PPI.  6. History of seizures. Continue  Keppra.  7. Type 2 diabetes mellitus. Hemoglobin A1c April 17 9.6. Continue sliding scale insulin. Required 9 units of coverage for 24 hours. Continue Lantus 15 units daily at bedtime. Blood sugars adequately controlled.  8. Atrial fibrillation CHA2DS2-VASc Score 8 Patient is currently rate controlled We'll continue Apixaban per pharmacy Continue carvedilol   Pain management: When necessary Tylenol Activity: SNF per physical therapy Bowel regimen: last BM 09/25/2015, MiraLAX added Diet: Cardiac diet carb modified DVT Prophylaxis: on therapeutic anticoagulation.  Advance goals of care discussion: DNR/DNI  Family Communication: no family was present at bedside, at the time of interview.   Disposition:  Discharge to SNF, pending improvement in volume status Expected discharge date: May 26  Consultants: Cardiology Procedures: None  Antibiotics: Anti-infectives    None        Intake/Output Summary (Last 24 hours) at 09/26/15 0746 Last data filed at 09/26/15 0605  Gross per 24 hour  Intake    960 ml  Output   4075 ml  Net  -3115 ml   Filed Weights   09/24/15 0543 09/25/15 0405 09/26/15 0626  Weight: 138.801 kg (306 lb) 158.532 kg (349 lb 8 oz) 155.085 kg (341 lb 14.4 oz)    Objective: Physical Exam: Filed Vitals:   09/25/15 0405 09/25/15 1222 09/25/15 1950 09/26/15 0626  BP: 122/51 95/59 130/56 107/51  Pulse: 83 92 85 89  Temp: 98.5 F (36.9 C) 98.2 F (36.8 C) 98.5 F (36.9 C) 98.5 F (36.9 C)  TempSrc: Oral Oral Oral Oral  Resp: 18 20 18 17   Height:      Weight: 158.532 kg (349 lb 8 oz)   155.085 kg (341 lb 14.4 oz)  SpO2: 96% 98% 94% 95%  General: Alert, Awake and Oriented to Time, Place and Person. Appear in mild distress ENT: Oral Mucosa clear moist. Neck: difficult to assess  JVD, no Abnormal Mass Or lumps Cardiovascular: S1 and S2 Present, aortic systolic  Murmur, Respiratory: Bilateral Air entry equal and Decreased, bilateral  Crackles, no  wheezes Abdomen: Bowel Sound present, Soft and no tenderness Skin: no redness, no Rash  Extremities: bilateral Pedal edema, no calf tenderness  Data Reviewed: CBC:  Recent Labs Lab 09/22/15 1513 09/25/15 0440  WBC 3.4* 4.5  NEUTROABS 1.9  --   HGB 12.2 11.3*  HCT 41.7 38.0  MCV 99.5 96.9  PLT 106* 123456*   Basic Metabolic Panel:  Recent Labs Lab 09/22/15 1513 09/23/15 0323 09/24/15 0233 09/25/15 0440  NA 141 141 139 138  K 4.5 4.3 4.3 4.3  CL 106 102 96* 94*  CO2 29 34* 38* 37*  GLUCOSE 196* 178* 177* 177*  BUN 23* 20 17 20   CREATININE 0.82 0.79 0.86 0.99  CALCIUM 9.0 8.9 8.3* 8.3*  MG  --   --   --  1.6*    Liver Function Tests:  Recent Labs Lab 09/22/15 1513  AST 12*  ALT 8*  ALKPHOS 53  BILITOT 0.7  PROT 6.0*  ALBUMIN 2.8*   No results for input(s): LIPASE, AMYLASE in the last 168 hours. No results for input(s): AMMONIA in the last 168 hours. Coagulation Profile: No results for input(s): INR, PROTIME in the last 168 hours. Cardiac Enzymes:  Recent Labs Lab 09/22/15 1513  TROPONINI <0.03   BNP (last 3 results) No results for input(s): PROBNP in the last 8760 hours.  CBG:  Recent Labs Lab 09/25/15 0538 09/25/15 1136 09/25/15 1614 09/25/15 2057 09/26/15 0602  GLUCAP 176* 188* 190* 164* 155*    Studies: No results found.   Scheduled Meds: . acetaZOLAMIDE  250 mg Oral Daily  . apixaban  5 mg Oral BID  . carvedilol  20 mg Oral Daily  . ferrous sulfate  325 mg Oral Q breakfast  . furosemide  40 mg Intravenous TID  . guaiFENesin  1,200 mg Oral BID  . insulin aspart  0-15 Units Subcutaneous TID WC  . insulin glargine  15 Units Subcutaneous QHS  . levETIRAcetam  500 mg Oral BID  . pantoprazole  40 mg Oral Q0600  . potassium chloride  20 mEq Oral Daily  . pregabalin  75 mg Oral BID  . rOPINIRole  2 mg Oral QID  . sodium chloride flush  3 mL Intravenous Q12H   Continuous Infusions:  PRN Meds: sodium chloride, acetaminophen,  albuterol, ALPRAZolam, diclofenac sodium, hydrOXYzine, ipratropium-albuterol, ondansetron (ZOFRAN) IV, sodium chloride flush  Time spent: 30 minutes  Author: Berle Mull, MD [facility] Pager: [phone] 09/26/2015 7:46 AM  If 7PM-7AM, please contact night-coverage at [email or web address], password [facility]

## 2015-09-26 NOTE — Progress Notes (Signed)
Subjective:  Patient denies any chest pain or shortness of breath and leg swelling slowly improving. Still volume overloaded bicarbonate creeping up started on acetazolamide yesterday. BP improved after reducing carvedilol dose  Objective:  Vital Signs in the last 24 hours: Temp:  [98.2 F (36.8 C)-98.5 F (36.9 C)] 98.5 F (36.9 C) (05/25 0626) Pulse Rate:  [85-92] 89 (05/25 0626) Resp:  [17-20] 17 (05/25 0626) BP: (95-130)/(51-59) 107/51 mmHg (05/25 0626) SpO2:  [94 %-98 %] 95 % (05/25 0626) Weight:  [155.085 kg (341 lb 14.4 oz)] 155.085 kg (341 lb 14.4 oz) (05/25 0626)  Intake/Output from previous day: 05/24 0701 - 05/25 0700 In: 960 [P.O.:960] Out: 4075 [Urine:4075] Intake/Output from this shift: Total I/O In: 360 [P.O.:360] Out: -   Physical Exam: Exam unchanged  Lab Results:  Recent Labs  09/25/15 0440 09/26/15 0651  WBC 4.5 4.0  HGB 11.3* 11.5*  PLT 120* 118*    Recent Labs  09/25/15 0440 09/26/15 0651  NA 138 138  K 4.3 4.0  CL 94* 93*  CO2 37* 38*  GLUCOSE 177* 112*  BUN 20 19  CREATININE 0.99 0.90   No results for input(s): TROPONINI in the last 72 hours.  Invalid input(s): CK, MB Hepatic Function Panel No results for input(s): PROT, ALBUMIN, AST, ALT, ALKPHOS, BILITOT, BILIDIR, IBILI in the last 72 hours. No results for input(s): CHOL in the last 72 hours. No results for input(s): PROTIME in the last 72 hours.  Imaging: Imaging results have been reviewed and No results found.  Cardiac Studies:  Assessment/Plan:  Resolving decompensated congestive heart failure secondary to preserved LV systolic function. Hypertension. Type 2 diabetes mellitus Chronic atrial fibrillation on chronic anticoagulation. Hyperlipidemia. Morbid obesity. History of CVA and TIA in the past. Seizure disorder. Restless leg syndrome. 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 Degenerative joint disease. Seizure disorder. Hypoalbuminemia Metabolic alkalosis secondary to  diuretics Plan Change Lasix to by mouth as per orders Acetazolamide 500 mg IV 1 next dose today Add Zaroxolyn 2.5 mg daily I will sign off please call if needed Okay to discharge to skilled nursing facility from cardiac point of view  LOS: 3 days    Charolette Forward 09/26/2015, 10:13 AM

## 2015-09-26 NOTE — Progress Notes (Signed)
CRITICAL VALUE ALERT  Critical value received:  CO2:80.6  Date of notification:  09/26/2015  Time of notification:  [id number]  Critical value read back:Yes.    Nurse who received alert:  Clarise Cruz   MD notified (1st page):  Posey Pronto  Time of first page:  [phone]  MD notified (2nd page):  Time of second page:  Responding MD:  Posey Pronto  Time MD responded:  [phone]

## 2015-09-27 DIAGNOSIS — G9340 Encephalopathy, unspecified: Secondary | ICD-10-CM | POA: Insufficient documentation

## 2015-09-27 DIAGNOSIS — E1165 Type 2 diabetes mellitus with hyperglycemia: Secondary | ICD-10-CM

## 2015-09-27 DIAGNOSIS — Z008 Encounter for other general examination: Secondary | ICD-10-CM

## 2015-09-27 DIAGNOSIS — I5033 Acute on chronic diastolic (congestive) heart failure: Secondary | ICD-10-CM

## 2015-09-27 DIAGNOSIS — I509 Heart failure, unspecified: Secondary | ICD-10-CM | POA: Insufficient documentation

## 2015-09-27 DIAGNOSIS — Z0189 Encounter for other specified special examinations: Secondary | ICD-10-CM | POA: Insufficient documentation

## 2015-09-27 DIAGNOSIS — E118 Type 2 diabetes mellitus with unspecified complications: Secondary | ICD-10-CM

## 2015-09-27 DIAGNOSIS — G4733 Obstructive sleep apnea (adult) (pediatric): Secondary | ICD-10-CM

## 2015-09-27 LAB — BLOOD GAS, ARTERIAL
Acid-Base Excess: 12.8 mmol/L — ABNORMAL HIGH (ref 0.0–2.0)
BICARBONATE: 39.2 meq/L — AB (ref 20.0–24.0)
Drawn by: 24610
O2 Content: 3 L/min
O2 SAT: 95.9 %
PH ART: 7.327 — AB (ref 7.350–7.450)
PO2 ART: 85.6 mmHg (ref 80.0–100.0)
Patient temperature: 98.6
TCO2: 41.5 mmol/L (ref 0–100)
pCO2 arterial: 77 mmHg (ref 35.0–45.0)

## 2015-09-27 LAB — GLUCOSE, CAPILLARY
GLUCOSE-CAPILLARY: 227 mg/dL — AB (ref 65–99)
GLUCOSE-CAPILLARY: 167 mg/dL — AB (ref 65–99)
GLUCOSE-CAPILLARY: 257 mg/dL — AB (ref 65–99)
Glucose-Capillary: 219 mg/dL — ABNORMAL HIGH (ref 65–99)

## 2015-09-27 LAB — CBC WITH DIFFERENTIAL/PLATELET
BASOS PCT: 0 %
Basophils Absolute: 0 10*3/uL (ref 0.0–0.1)
EOS ABS: 0.1 10*3/uL (ref 0.0–0.7)
Eosinophils Relative: 3 %
HEMATOCRIT: 39.3 % (ref 36.0–46.0)
HEMOGLOBIN: 11.5 g/dL — AB (ref 12.0–15.0)
LYMPHS ABS: 1 10*3/uL (ref 0.7–4.0)
Lymphocytes Relative: 27 %
MCH: 28.8 pg (ref 26.0–34.0)
MCHC: 29.3 g/dL — ABNORMAL LOW (ref 30.0–36.0)
MCV: 98.3 fL (ref 78.0–100.0)
Monocytes Absolute: 0.4 10*3/uL (ref 0.1–1.0)
Monocytes Relative: 12 %
NEUTROS ABS: 2.1 10*3/uL (ref 1.7–7.7)
NEUTROS PCT: 58 %
Platelets: 127 10*3/uL — ABNORMAL LOW (ref 150–400)
RBC: 4 MIL/uL (ref 3.87–5.11)
RDW: 14.4 % (ref 11.5–15.5)
WBC: 3.6 10*3/uL — AB (ref 4.0–10.5)

## 2015-09-27 LAB — BASIC METABOLIC PANEL
ANION GAP: 7 (ref 5–15)
BUN: 24 mg/dL — ABNORMAL HIGH (ref 6–20)
CO2: 40 mmol/L — ABNORMAL HIGH (ref 22–32)
Calcium: 8.8 mg/dL — ABNORMAL LOW (ref 8.9–10.3)
Chloride: 91 mmol/L — ABNORMAL LOW (ref 101–111)
Creatinine, Ser: 1.13 mg/dL — ABNORMAL HIGH (ref 0.44–1.00)
GFR calc Af Amer: 49 mL/min — ABNORMAL LOW (ref >60)
GFR, EST NON AFRICAN AMERICAN: 42 mL/min — AB (ref >60)
Glucose, Bld: 215 mg/dL — ABNORMAL HIGH (ref 65–99)
POTASSIUM: 4.1 mmol/L (ref 3.5–5.1)
SODIUM: 138 mmol/L (ref 135–145)

## 2015-09-27 LAB — MAGNESIUM: MAGNESIUM: 2.2 mg/dL (ref 1.7–2.4)

## 2015-09-27 MED ORDER — ROPINIROLE HCL 2 MG PO TABS: 2.0000 mg | ORAL_TABLET | Freq: Three times a day (TID) | ORAL | Status: AC

## 2015-09-27 MED ORDER — POLYETHYLENE GLYCOL 3350 17 G PO PACK: 17.0000 g | PACK | Freq: Every day | ORAL | Status: DC

## 2015-09-27 MED ORDER — FUROSEMIDE 40 MG PO TABS: 40.0000 mg | ORAL_TABLET | Freq: Two times a day (BID) | ORAL | Status: DC

## 2015-09-27 MED ORDER — APIXABAN 5 MG PO TABS: 5.0000 mg | ORAL_TABLET | Freq: Two times a day (BID) | ORAL | Status: AC

## 2015-09-27 MED ORDER — POTASSIUM CHLORIDE CRYS ER 20 MEQ PO TBCR: 20.0000 meq | EXTENDED_RELEASE_TABLET | Freq: Every day | ORAL | Status: DC

## 2015-09-27 MED ORDER — INSULIN GLARGINE 100 UNIT/ML ~~LOC~~ SOLN: 15.0000 [IU] | Freq: Every day | SUBCUTANEOUS | Status: DC

## 2015-09-27 NOTE — Progress Notes (Signed)
RT set up BIPAP and placed on patient. The BIPAP is unable to show how much tidal volume the patient is receiving. Patient is resting comfortably and has good chest rise with O2 Sat 93%. RT bled in 4L O2 through bipap circuit. IPAP is 8 and EPAP is 4 per MD order.

## 2015-09-27 NOTE — Progress Notes (Signed)
Physical Therapy Treatment Patient Details Name: Elizabeth Pugh MRN: [id number] DOB: 03/06/1927 Today's Date: 09/28/2015    History of Present Illness Pt is a 82 y/o F who presented w/ Bil LE edema.  Found to have mild CHF and pulmonary edema on imaging studies.  Pt's PMH includes chronic diastolic CHF, stroke, anxiety, anemia, seizures, a-fib.    PT Comments    Pt remains very limited due to weakness and body habitus. Worked on pt using UE's to pull her trunk forward off of bed with HOB up. Pt repeated 5 times with difficulty due to pressure on abdomen. Currently needs mechanical lift for mobility.  Follow Up Recommendations  SNF     Equipment Recommendations  None recommended by PT    Recommendations for Other Services       Precautions / Restrictions Precautions Precautions: Fall Precaution Comments: monitor O2 Restrictions Weight Bearing Restrictions: No    Mobility  Bed Mobility                  Transfers                    Ambulation/Gait                 Stairs            Wheelchair Mobility    Modified Rankin (Stroke Patients Only)       Balance                                    Cognition Arousal/Alertness: Awake/alert Behavior During Therapy: WFL for tasks assessed/performed Overall Cognitive Status: Within Functional Limits for tasks assessed                      Exercises General Exercises - Upper Extremity Shoulder Flexion: AAROM;Both;10 reps;Supine General Exercises - Lower Extremity Ankle Circles/Pumps: AROM;Both;Supine;10 reps Heel Slides:  (attempted but pt resisting all movement of LE's)    General Comments        Pertinent Vitals/Pain Pain Assessment: Faces Faces Pain Scale: Hurts even more Pain Location: bil knees/legs Pain Descriptors / Indicators: Grimacing;Guarding Pain Intervention(s): Limited activity within patient's tolerance;Monitored during session    Home Living                       Prior Function            PT Goals (current goals can now be found in the care plan section) Progress towards PT goals: Not progressing toward goals - comment    Frequency  Min 2X/week    PT Plan Current plan remains appropriate    Co-evaluation             End of Session Equipment Utilized During Treatment: Oxygen Activity Tolerance: Patient limited by fatigue Patient left: in bed;with call bell/phone within reach     Time: [id number] PT Time Calculation (min) (ACUTE ONLY): 10 min  Charges:  $Therapeutic Exercise: 8-22 mins                    G Codes:      [name] Sep 28, 2015, 10:40 AM [facility] PT [phone]

## 2015-09-27 NOTE — Consult Note (Signed)
Name: Elizabeth Pugh MRN: [id number] DOB: 1926/08/22    ADMISSION DATE:  09/22/2015 CONSULTATION DATE:  09/27/15  REFERRING MD :  Dr. Posey Pronto   CHIEF COMPLAINT:  Hypercarbia / OHS    HISTORY OF PRESENT ILLNESS:  82 y/o morbidly obese, bed bound female with a PMH of GERD, GIB, DM II, HTN, CVA, Arthritis, Anemia, RLS, AF on Eliquis and CHF ** who presented to [facility] on 5/21 with complaints of increased lower extremity swelling.    The patient was recently admitted from 4/27 - 5/1 for PNA.  After discharge, the patient noted that her lower extremity swelling became progressively worse.  At time of discharge, she was taken off lasix due to pre-renal azotemia and it was not restarted.  On admission, the patient was noted to be 80% on room air.  She also was treated for an acute bronchitis approximately prior to 4/27 admission.  She was admitted per [facility] for suspected acute on chronic diastolic CHF.  The patient was diuresed with improvement in symptoms.  Baseline weight reportedly ~ 270 lbs.  She was 355 lbs on admission. Cardiology was consulted for evaluation of dCHF.  Initial CXR demonstrated a large loculated right pleural effusion and cardiomegaly.  The patient was noted to have ongoing hypercarbia with concern for OHS and PCCM consulted for evaluation.   PAST MEDICAL HISTORY :   has a past medical history of Stroke [facility]); GERD (gastroesophageal reflux disease); Hypertension; Diabetes mellitus; Blood transfusion; Arthritis; Anxiety; Anemia; GI (gastrointestinal bleed); Bronchitis; Restless leg syndrome; Atrial fibrillation ([place]); Acute delirium (04/12/2011); Seizures ([place]); CHF (congestive heart failure) ([place]); Acute kidney injury ([place]) (09/02/2015); and Lobar pneumonia due to unspecified organism (09/02/2015).  has past surgical history that includes Eye surgery; Abdominal hysterectomy; Tubal ligation; and EUS (09/24/2011).   Prior to Admission medications   Medication Sig Start Date End Date Taking?  Authorizing Provider  albuterol (PROVENTIL HFA;VENTOLIN HFA) 108 (90 BASE) MCG/ACT inhaler Inhale 1 puff into the lungs every 6 (six) hours as needed for wheezing or shortness of breath.   Yes Historical Provider, MD  ALPRAZolam (XANAX) 0.25 MG tablet Take 1 tablet (0.25 mg total) by mouth 2 (two) times daily as needed for anxiety. 01/28/15  Yes Jessica U Vann, DO  amLODipine (NORVASC) 5 MG tablet Take 5 mg by mouth daily. 08/09/15  Yes Historical Provider, MD  calcium-vitamin D (CALCIUM 500+D) 500-400 MG-UNIT per tablet Take 1 tablet by mouth 2 (two) times daily.   Yes Historical Provider, MD  carvedilol (COREG CR) 20 MG 24 hr capsule Take 1 capsule (20 mg total) by mouth daily. Patient taking differently: Take 40 mg by mouth daily.  01/25/15  Yes [name], MD  doxepin (SINEQUAN) 25 MG capsule Take 25 mg by mouth at bedtime.   Yes Historical Provider, MD  ELIQUIS 2.5 MG TABS tablet Take 2.5 mg by mouth 2 (two) times daily. 08/14/15  Yes Historical Provider, MD  ferrous sulfate 325 (65 FE) MG tablet Take 325 mg by mouth daily with breakfast.   Yes Historical Provider, MD  glimepiride (AMARYL) 2 MG tablet Take 2 mg by mouth daily before breakfast.   Yes Historical Provider, MD  guaiFENesin (MUCINEX) 600 MG 12 hr tablet Take 2 tablets (1,200 mg total) by mouth 2 (two) times daily. 09/02/15  Yes Nishant Dhungel, MD  hydrOXYzine (ATARAX/VISTARIL) 25 MG tablet Take 25 mg by mouth 2 (two) times daily as needed for itching.    Yes Historical Provider, MD  insulin  aspart (NOVOLOG) 100 UNIT/ML injection Inject 0-12 Units into the skin 3 (three) times daily before meals. Under 150 = 0 units, 150 - 200 = 2 units, 201- 250 = 4 units, 251- 300 = 6 units, 301- 350 = 8 units, 351-400 = 10 units, 401-450 = 12  units   Yes Historical Provider, MD  ipratropium-albuterol (DUONEB) 0.5-2.5 (3) MG/3ML SOLN Take 3 mLs by nebulization every 6 (six) hours as needed. Patient taking differently: Take 3 mLs by nebulization every  6 (six) hours as needed (FOR SOB).  01/25/15  Yes [name], MD  levETIRAcetam (KEPPRA) 500 MG tablet Take 1 tablet (500 mg total) by mouth 2 (two) times daily. 02/27/12  Yes Monika Salk, MD  pantoprazole (PROTONIX) 40 MG tablet Take 1 tablet (40 mg total) by mouth daily at 6 (six) AM. 01/28/15  Yes Geradine Girt, DO  pregabalin (LYRICA) 75 MG capsule Take 75 mg by mouth 2 (two) times daily.   Yes Historical Provider, MD  VOLTAREN 1 % GEL Apply 1 application topically 4 (four) times daily as needed (knee pain). For pain 10/26/11  Yes Historical Provider, MD  apixaban (ELIQUIS) 5 MG TABS tablet Take 1 tablet (5 mg total) by mouth 2 (two) times daily. 09/27/15   Lavina Hamman, MD  furosemide (LASIX) 20 MG tablet Take 20 mg by mouth 2 (two) times daily. 08/23/15   Historical Provider, MD  furosemide (LASIX) 40 MG tablet Take 1 tablet (40 mg total) by mouth 2 (two) times daily. 09/27/15   Lavina Hamman, MD  insulin glargine (LANTUS) 100 UNIT/ML injection Inject 0.15 mLs (15 Units total) into the skin at bedtime. 09/27/15   Lavina Hamman, MD  polyethylene glycol (MIRALAX / Floria Raveling) packet Take 17 g by mouth daily. 09/27/15   Lavina Hamman, MD  potassium chloride SA (K-DUR,KLOR-CON) 20 MEQ tablet Take 1 tablet (20 mEq total) by mouth daily. 09/27/15   Lavina Hamman, MD  rOPINIRole (REQUIP) 2 MG tablet Take 1 tablet (2 mg total) by mouth 3 (three) times daily. 09/27/15   Lavina Hamman, MD  telmisartan (MICARDIS) 40 MG tablet Take 40 mg by mouth 2 (two) times daily. 07/07/15   Historical Provider, MD   Allergies  Allergen Reactions  . Erythromycin Other (See Comments)    Vaginal itching  . Penicillins Rash    Has patient had a PCN reaction causing immediate rash, facial/tongue/throat swelling, SOB or lightheadedness with hypotension: No Has patient had a PCN reaction causing severe rash involving mucus membranes or skin necrosis: No Has patient had a PCN reaction that required hospitalization: No Has  patient had a PCN reaction occurring within the last 10 years: No If all of the above answers are "NO", then may proceed with Cephalosporin use.  Marland Kitchen Zithromax [Azithromycin] Other (See Comments)    gave her a yeast infection.    FAMILY HISTORY:  family history includes Aneurysm in her mother; Asthma in her sister; Heart attack in her father; Heart failure in her sister; Kidney failure in her sister; Stroke in her sister.   SOCIAL HISTORY:  reports that she quit smoking about 30 years ago. Her smoking use included Cigarettes. She has never used smokeless tobacco. She reports that she does not drink alcohol or use illicit drugs.  REVIEW OF SYSTEMS:  POSITIVES IN BOLD  Constitutional: Negative for fever, chills, weight loss, malaise/fatigue and diaphoresis.  HENT: Negative for hearing loss, ear pain, nosebleeds, congestion, sore throat, neck  pain, tinnitus and ear discharge.   Eyes: Negative for blurred vision, double vision, photophobia, pain, discharge and redness.  Respiratory: Negative for cough, hemoptysis, sputum production, shortness of breath, wheezing and stridor.   Cardiovascular: Negative for chest pain, palpitations, orthopnea, claudication, leg swelling and PND.  Gastrointestinal: Negative for heartburn, nausea, vomiting, abdominal pain, diarrhea, constipation, blood in stool and melena.  Genitourinary: Negative for dysuria, urgency, frequency, hematuria and flank pain.  Musculoskeletal: Negative for myalgias, back pain, joint pain and falls.  Skin: Negative for itching and rash.  Neurological: Negative for dizziness, tingling, tremors, sensory change, speech change, focal weakness, seizures, loss of consciousness, weakness and headaches.  Endo/Heme/Allergies: Negative for environmental allergies and polydipsia. Does not bruise/bleed easily.  SUBJECTIVE:   VITAL SIGNS: Temp:  [98.1 F (36.7 C)-98.2 F (36.8 C)] 98.1 F (36.7 C) (05/26 0553) Pulse Rate:  [70-91] 79 (05/26  0553) Resp:  [20] 20 (05/25 2159) BP: (111-117)/(49-68) 111/68 mmHg (05/26 0553) SpO2:  [96 %-99 %] 96 % (05/26 0553) Weight:  [339 lb 1.6 oz (153.815 kg)] 339 lb 1.6 oz (153.815 kg) (05/26 0600)  PHYSICAL EXAMINATION: General:  Morbidly obese elderly female in NAD  Neuro:  AAOx4, speech clear, MAE  HEENT:  MM pink/moist, no jvd Cardiovascular:  s1s2 rrr, no m/r/g Lungs:  resp's even/non-labored, lungs bilaterally diminished, lower ext crackles Abdomen:  Obese/soft, bsx4 active  Musculoskeletal:  No acute deformities  Skin:  Warm/dry, 2+ pitting LE edema, wrapped in ACE   Recent Labs Lab 09/25/15 0440 09/26/15 0651 09/27/15 0318  NA 138 138 138  K 4.3 4.0 4.1  CL 94* 93* 91*  CO2 37* 38* 40*  BUN 20 19 24*  CREATININE 0.99 0.90 1.13*  GLUCOSE 177* 112* 215*    Recent Labs Lab 09/25/15 0440 09/26/15 0651 09/27/15 0318  HGB 11.3* 11.5* 11.5*  HCT 38.0 37.1 39.3  WBC 4.5 4.0 3.6*  PLT 120* 118* 127*   Ct Head Wo Contrast  09/26/2015  CLINICAL DATA:  Acute encephalopathy EXAM: CT HEAD WITHOUT CONTRAST TECHNIQUE: Contiguous axial images were obtained from the base of the skull through the vertex without intravenous contrast. COMPARISON:  02/25/2012 FINDINGS: Diffuse cortical atrophy particularly in the infratentorial compartment. This is stable. No hydrocephalus. No evidence of mass or vascular territory infarct. No hemorrhage or extra-axial fluid. Prominent perivascular spaces versus tiny lacunar infarcts left temporal lobe, stable. A Numerous opacified right mastoid air cells. Left mastoid air cells primarily clear. Small mucous retention cyst right maxillary sinus. Visualized portions of the paranasal sinuses otherwise clear. Prominent venous channels throughout the calvarium stable. Calvarium otherwise intact. Extensive intracranial carotid artery calcification. IMPRESSION: Age-related involutional change. Partial opacification right mastoid air cells. The presence of fluid  within the right mastoid air cells is new from 2013. Electronically Signed   By: Skipper Cliche M.D.   On: 09/26/2015 18:54   SIGNIFICANT EVENTS  5/21  Admit with LE swelling, decompensated dCHF exacerbation  STUDIES:  5/26 ABG >> 7.327 / 77 / 85 / 36    ASSESSMENT / PLAN:  Discussion:  82 y/o morbidly obese female admitted with decompensated diastolic CHF, R partially loculated pleural effusion.  Noted to have chronic hypercarbia and PCCM consulted for evaluation.    Suspected Obesity Hypoventilation Syndrome / OSA  Chronic Hypercarbic Respiratory Failure  Partially Loculated Right Pleural Effusion  Chronic Diastolic CHF   Plan: Auto-set BiPAP QHS & PRN daytime sleep  Oxygen as needed to support saturations > 92% Diuresis as renal function /  BP permit  Flutter valve  Will need outpatient sleep study  PT recommendations for SNF at discharge, they can use auto-set bipap while at SNF until sleep study can be arranged  Intermittent CXR  Monitor effusion size    Elizabeth Gens, NP-C [facility] Pgr: [phone] or if no answer [phone] 09/27/2015, 12:42 PM  STAFF NOTE: I, Elizabeth Roof, MD FACP have personally reviewed patient's available data, including medical history, events of note, physical examination and test results as part of my evaluation. I have discussed with resident/NP and other care providers such as pharmacist, RN and RRT. In addition, I personally evaluated patient and elicited key findings of: what a super lady, great spirits, no distress, obese, edema remains on lower ext, ABG reviewed with a baseline co2 about 72, likely biggest contributor is OHS / pickwickian syndrome for years, lasix per cards, no role acetazolaminde would harm her compensation, would add noctrunal BIPAP not cpap and assess am abg, will need outpt sleep study titration, pulm will follow up once in am, crt rise noted, may need reduction lasix  Elizabeth Pugh. Titus Mould, MD,  [place] Pgr: [facility] 09/27/2015 3:40 PM

## 2015-09-27 NOTE — Progress Notes (Signed)
[facility] Progress Note  Patient: Elizabeth Pugh [id number]   PCP: Philis Fendt, MD DOB: 06-22-1926   DOA: 09/22/2015   DOS: 09/27/2015   Date of Service: the patient was seen and examined on 09/27/2015  Subjective: No acute events overnight. Encephalopathy likely resolved.  This morning the patient denies having any increased sleepiness. No nausea no vomiting. No chest pain. Nutrition: Tolerating oral diet  Brief hospital course: Patient with past medical history of chronic diastolic CHF, diabetes mellitus, hypertension was admitted on 09/22/2015, with complaint of bilateral leg swelling, was found to have Acute on chronic diastolic CHF. Patient was started on IV Lasix as well as cardiology was consulted. Currently further plan is continue further workup of hypercarbia and monitor renal function  Assessment and Plan: 1. Acute on chronic diastolic (congestive) heart failure ([employer]) Echo in September 16 shows EF of 0000000 with diastolic dysfunction. Baseline weight 270 pound, admission weight 355 pounds. Today 339 pounds. -10 L. Continue Coreg , dose reduced by cardiology 20 mg  Continue oral Lasix. Patient was placed on Zaroxolyn but due to increase in the serum creatinine I would discontinue it Holding Micardis. Daily weight and strict ins and outs Continue unna boot  2. Essential hypertension. Continue Coreg. Dose reduced by cardiology to 20 mg Stop Norvasc.  3. Morbid obesity. PTOT eval recommends patient will need SNF on discharge.  4. Restless leg syndrome. Diabetic neuropathy Acute encephalopathy Increased sleepiness per patient and family Untreated sleep apnea. Chronic compensated hypercarbia on ABG 2.   Continue Requip. Continue Lyrica. Discontinue doxepin. No evidence of focal deficit on examination. Normal CT head, ammonia, 0000000, folic acid, TSH. Encephalopathy is likely medication induced as well as from untreated sleep apnea as her ABG in September  2016 was showing chronic compensated hypercarbia. Appreciate pulmonary consult. Patient will be started on BiPAP daily at bedtime.  5. GERD. Continue PPI.  6. History of seizures. Continue Keppra.  7. Type 2 diabetes mellitus. Hemoglobin A1c April 17 9.6. Continue sliding scale insulin. Required 9 units of coverage for 24 hours. Continue Lantus 15 units daily at bedtime. Blood sugars adequately controlled.  8. Atrial fibrillation CHA2DS2-VASc Score 8 Patient is currently rate controlled We'll continue Apixaban per pharmacy Continue carvedilol   Pain management: When necessary Tylenol Activity: SNF per physical therapy Bowel regimen: last BM 09/25/2015, MiraLAX added Diet: Cardiac diet carb modified DVT Prophylaxis: on therapeutic anticoagulation.  Advance goals of care discussion: DNR/DNI  Family Communication: no family was present at bedside, at the time of interview.   Disposition:  Discharge to SNF, pending improvement in volume status Expected discharge date: May 27 pending tolerance to BiPAP auto  Consultants: Cardiology, pulmonary Procedures: None  Antibiotics: Anti-infectives    None        Intake/Output Summary (Last 24 hours) at 09/27/15 1943 Last data filed at 09/27/15 1327  Gross per 24 hour  Intake    680 ml  Output      0 ml  Net    680 ml   Filed Weights   09/25/15 0405 09/26/15 0626 09/27/15 0600  Weight: 158.532 kg (349 lb 8 oz) 155.085 kg (341 lb 14.4 oz) 153.815 kg (339 lb 1.6 oz)    Objective: Physical Exam: Filed Vitals:   09/26/15 2159 09/27/15 0553 09/27/15 0600 09/27/15 1214  BP: 117/49 111/68  115/60  Pulse: 70 79  85  Temp: 98.2 F (36.8 C) 98.1 F (36.7 C)  98.5 F (36.9 C)  TempSrc: Oral Oral  Oral  Resp: 20   20  Height:      Weight:   153.815 kg (339 lb 1.6 oz)   SpO2: 99% 96%  93%   General: Alert, Awake and Oriented to Time, Place and Person. Appear in mild distress ENT: Oral Mucosa clear moist. Neck: difficult  to assess  JVD, no Abnormal Mass Or lumps Cardiovascular: S1 and S2 Present, aortic systolic  Murmur, Respiratory: Bilateral Air entry equal and Decreased, bilateral  Crackles, no wheezes Abdomen: Bowel Sound present, Soft and no tenderness Skin: no redness, no Rash  Extremities: bilateral Pedal edema, no calf tenderness  Data Reviewed: CBC:  Recent Labs Lab 09/22/15 1513 09/25/15 0440 09/26/15 0651 09/27/15 0318  WBC 3.4* 4.5 4.0 3.6*  NEUTROABS 1.9  --  2.5 2.1  HGB 12.2 11.3* 11.5* 11.5*  HCT 41.7 38.0 37.1 39.3  MCV 99.5 96.9 95.1 98.3  PLT 106* 120* 118* [id number]*   Basic Metabolic Panel:  Recent Labs Lab 09/23/15 0323 09/24/15 0233 09/25/15 0440 09/26/15 0651 09/27/15 0318  NA 141 139 138 138 138  K 4.3 4.3 4.3 4.0 4.1  CL 102 96* 94* 93* 91*  CO2 34* 38* 37* 38* 40*  GLUCOSE 178* 177* 177* 112* 215*  BUN 20 17 20 19  24*  CREATININE 0.79 0.86 0.99 0.90 1.13*  CALCIUM 8.9 8.3* 8.3* 8.5* 8.8*  MG  --   --  1.6* 2.0 2.2    Liver Function Tests:  Recent Labs Lab 09/22/15 1513  AST 12*  ALT 8*  ALKPHOS 53  BILITOT 0.7  PROT 6.0*  ALBUMIN 2.8*   No results for input(s): LIPASE, AMYLASE in the last 168 hours.  Recent Labs Lab 09/26/15 1638  AMMONIA 36*   Coagulation Profile: No results for input(s): INR, PROTIME in the last 168 hours. Cardiac Enzymes:  Recent Labs Lab 09/22/15 1513  TROPONINI <0.03   BNP (last 3 results) No results for input(s): PROBNP in the last 8760 hours.  CBG:  Recent Labs Lab 09/26/15 1637 09/26/15 2150 09/27/15 0553 09/27/15 1122 09/27/15 1705  GLUCAP 166* 177* 227* 167* 257*    Studies: No results found.   Scheduled Meds: . acetaZOLAMIDE  250 mg Oral Daily  . apixaban  5 mg Oral BID  . carvedilol  20 mg Oral Daily  . ferrous sulfate  325 mg Oral Q breakfast  . furosemide  40 mg Oral BID  . guaiFENesin  1,200 mg Oral BID  . insulin aspart  0-15 Units Subcutaneous TID WC  . insulin glargine  15 Units  Subcutaneous QHS  . levETIRAcetam  500 mg Oral BID  . pantoprazole  40 mg Oral Q0600  . polyethylene glycol  17 g Oral Daily  . potassium chloride  20 mEq Oral Daily  . pregabalin  75 mg Oral BID  . rOPINIRole  2 mg Oral TID  . sodium chloride flush  3 mL Intravenous Q12H   Continuous Infusions:  PRN Meds: sodium chloride, acetaminophen, albuterol, diclofenac sodium, ipratropium-albuterol, ondansetron (ZOFRAN) IV, sodium chloride flush  Time spent: 30 minutes  Author: Berle Mull, MD [facility] Pager: [phone] 09/27/2015 7:43 PM  If 7PM-7AM, please contact night-coverage at [email or web address], password [facility]

## 2015-09-28 DIAGNOSIS — J9611 Chronic respiratory failure with hypoxia: Secondary | ICD-10-CM

## 2015-09-28 DIAGNOSIS — J9612 Chronic respiratory failure with hypercapnia: Secondary | ICD-10-CM

## 2015-09-28 LAB — BLOOD GAS, ARTERIAL
ACID-BASE EXCESS: 12.8 mmol/L — AB (ref 0.0–2.0)
Bicarbonate: 39.1 mEq/L — ABNORMAL HIGH (ref 20.0–24.0)
DRAWN BY: 44135
O2 Content: 4 L/min
O2 Saturation: 99 %
PATIENT TEMPERATURE: 98.6
PCO2 ART: 76.6 mmHg — AB (ref 35.0–45.0)
PH ART: 7.328 — AB (ref 7.350–7.450)
TCO2: 41.4 mmol/L (ref 0–100)
pO2, Arterial: 146 mmHg — ABNORMAL HIGH (ref 80.0–100.0)

## 2015-09-28 LAB — RENAL FUNCTION PANEL
ANION GAP: 10 (ref 5–15)
Albumin: 2.9 g/dL — ABNORMAL LOW (ref 3.5–5.0)
BUN: 29 mg/dL — AB (ref 6–20)
CO2: 38 mmol/L — AB (ref 22–32)
Calcium: 9.3 mg/dL (ref 8.9–10.3)
Chloride: 89 mmol/L — ABNORMAL LOW (ref 101–111)
Creatinine, Ser: 1.12 mg/dL — ABNORMAL HIGH (ref 0.44–1.00)
GFR calc Af Amer: 50 mL/min — ABNORMAL LOW (ref >60)
GFR calc non Af Amer: 43 mL/min — ABNORMAL LOW (ref >60)
GLUCOSE: 179 mg/dL — AB (ref 65–99)
POTASSIUM: 3.8 mmol/L (ref 3.5–5.1)
Phosphorus: 3.1 mg/dL (ref 2.5–4.6)
Sodium: 137 mmol/L (ref 135–145)

## 2015-09-28 LAB — MAGNESIUM: Magnesium: 2.2 mg/dL (ref 1.7–2.4)

## 2015-09-28 LAB — GLUCOSE, CAPILLARY
GLUCOSE-CAPILLARY: 228 mg/dL — AB (ref 65–99)
GLUCOSE-CAPILLARY: 216 mg/dL — AB (ref 65–99)
GLUCOSE-CAPILLARY: 266 mg/dL — AB (ref 65–99)
Glucose-Capillary: 164 mg/dL — ABNORMAL HIGH (ref 65–99)

## 2015-09-28 MED ORDER — INSULIN GLARGINE 100 UNIT/ML ~~LOC~~ SOLN
18.0000 [IU] | Freq: Every day | SUBCUTANEOUS | Status: DC
  Administered 2015-09-28 – 2015-09-30 (×3): 18 [IU] via SUBCUTANEOUS
  Filled 2015-09-28 (×4): qty 0.18

## 2015-09-28 MED ORDER — PANTOPRAZOLE SODIUM 40 MG PO TBEC
40.0000 mg | DELAYED_RELEASE_TABLET | Freq: Two times a day (BID) | ORAL | Status: DC
  Administered 2015-09-28 – 2015-10-01 (×7): 40 mg via ORAL
  Filled 2015-09-28 (×7): qty 1

## 2015-09-28 NOTE — Progress Notes (Signed)
Name: Elizabeth Pugh MRN: [id number] DOB: 25-May-1926    ADMISSION DATE:  09/22/2015 CONSULTATION DATE:  09/27/15  REFERRING MD :  Dr. Posey Pronto   CHIEF COMPLAINT:  Hypercarbia / OHS    HISTORY OF PRESENT ILLNESS:  82 y/o morbidly obese, bed bound female with a PMH of GERD, GIB, DM II, HTN, CVA, Arthritis, Anemia, RLS, AF on Eliquis and CHF  who presented to [facility] on 5/21 with complaints of increased lower extremity swelling.         SUBJECTIVE:  Slept ok on bipap, bothered by harsh dry cough daytime   VITAL SIGNS: Temp:  [98.1 F (36.7 C)-98.2 F (36.8 C)] 98.2 F (36.8 C) (05/27 1223) Pulse Rate:  [66-91] 81 (05/27 1223) Resp:  [18-20] 20 (05/27 1223) BP: (100-122)/(57-73) 122/73 mmHg (05/27 1223) SpO2:  [93 %-100 %] 94 % (05/27 1223) Weight:  [336 lb 14.4 oz (152.817 kg)] 336 lb 14.4 oz (152.817 kg) (05/27 0300)  PHYSICAL EXAMINATION: General:  Morbidly obese elderly female in NAD  Neuro:  AAOx4, speech clear, MAE  HEENT:  MM pink/moist, no jvd/ very hoarse Cardiovascular:  s1s2 rrr, no m/r/g Lungs:  resp's even/non-labored, lungs bilaterally diminished, lower ext crackles Abdomen:  Obese/soft, bsx4 active  Musculoskeletal:  No acute deformities  Skin:  Warm/dry, 2+ pitting LE edema, wrapped in ACE   Recent Labs Lab 09/26/15 0651 09/27/15 0318 09/28/15 0535  NA 138 138 137  K 4.0 4.1 3.8  CL 93* 91* 89*  CO2 38* 40* 38*  BUN 19 24* 29*  CREATININE 0.90 1.13* 1.12*  GLUCOSE 112* 215* 179*    Recent Labs Lab 09/25/15 0440 09/26/15 0651 09/27/15 0318  HGB 11.3* 11.5* 11.5*  HCT 38.0 37.1 39.3  WBC 4.5 4.0 3.6*  PLT 120* 118* 127*   Ct Head Wo Contrast  09/26/2015  CLINICAL DATA:  Acute encephalopathy EXAM: CT HEAD WITHOUT CONTRAST TECHNIQUE: Contiguous axial images were obtained from the base of the skull through the vertex without intravenous contrast. COMPARISON:  02/25/2012 FINDINGS: Diffuse cortical atrophy particularly in the infratentorial  compartment. This is stable. No hydrocephalus. No evidence of mass or vascular territory infarct. No hemorrhage or extra-axial fluid. Prominent perivascular spaces versus tiny lacunar infarcts left temporal lobe, stable. A Numerous opacified right mastoid air cells. Left mastoid air cells primarily clear. Small mucous retention cyst right maxillary sinus. Visualized portions of the paranasal sinuses otherwise clear. Prominent venous channels throughout the calvarium stable. Calvarium otherwise intact. Extensive intracranial carotid artery calcification. IMPRESSION: Age-related involutional change. Partial opacification right mastoid air cells. The presence of fluid within the right mastoid air cells is new from 2013. Electronically Signed   By: Skipper Cliche M.D.   On: 09/26/2015 18:54   SIGNIFICANT EVENTS  5/21  Admit with LE swelling, decompensated dCHF exacerbation  STUDIES:  5/26 ABG >> 7.327 / 77 / 85 / 39    ASSESSMENT / PLAN:  Discussion:  82 y/o morbidly obese female admitted with decompensated diastolic CHF, R partially loculated pleural effusion.  Noted to have chronic hypercarbia and PCCM consulted for evaluation.    Suspected Obesity Hypoventilation Syndrome / OSA  Chronic Hypercarbic Respiratory Failure  Partially Loculated Right Pleural Effusion  Chronic Diastolic CHF   Plan: Auto-set BiPAP QHS & PRN daytime sleep  Oxygen as needed to support saturations > 92% Diuresis as renal function / BP permit  Flutter valve  Will need outpatient sleep study  PT recommendations for SNF at discharge, they can use auto-set  bipap while at SNF until sleep study can be arranged  Intermittent CXR  Monitor effusion size  Cough most c/w  Classic Upper airway cough syndrome, so named because it's frequently impossible to sort out how much is  CR/sinusitis with freq throat clearing (which can be related to primary GERD)   vs  causing  secondary (" extra esophageal")  GERD from wide swings in  gastric pressure that occur with throat clearing, often  promoting self use of mint and menthol lozenges that reduce the lower esophageal sphincter tone and exacerbate the problem further in a cyclical fashion.   These are the same pts (now being labeled as having "irritable larynx syndrome" by some cough centers) who not infrequently have a history of having failed to tolerate ace inhibitors,  dry powder inhalers or biphosphonates or report having atypical reflux symptoms that don't respond to standard doses of PPI , and are easily confused as having aecopd or asthma flares by even experienced allergists/ pulmonologists.   rec max gerd rx / flutter/ non-mint or menthol containing hard rock candies   Christinia Gully, MD Pulmonary and [place] [phone] After 5:30 PM or weekends, call [phone]

## 2015-09-28 NOTE — Progress Notes (Signed)
[facility] Progress Note  Patient: Elizabeth Pugh [id number]   PCP: Philis Fendt, MD DOB: May 04, 1927   DOA: 09/22/2015   DOS: 09/28/2015   Date of Service: the patient was seen and examined on 09/28/2015  Subjective: Tolerated BiPAP yesterday. Denies any acute shortness of breath or confusion or sleepiness. No cough. Nutrition: Tolerating oral diet  Brief hospital course: Patient with past medical history of chronic diastolic CHF, diabetes mellitus, hypertension was admitted on 09/22/2015, with complaint of bilateral leg swelling, was found to have Acute on chronic diastolic CHF. Patient was started on IV Lasix as well as cardiology was consulted. Currently further plan is continue BiPAP daily at bedtime and identify the facility for transfer.  Assessment and Plan: 1. Acute on chronic diastolic (congestive) heart failure ([place]) Echo in September 16 shows EF of 0000000 with diastolic dysfunction. Baseline weight 270 pound, admission weight 355 pounds. Weight is also not checked today. Ins and outs are not accurate  strict ins and outs and daily weight recommended to RN.  Continue Coreg , dose reduced by cardiology 20 mg  Continue oral Lasix. Patient was placed on Zaroxolyn but due to increase in the serum creatinine I would discontinue it Holding Micardis. Continue unna boot, change once a week  2. Essential hypertension. Continue Coreg. Dose reduced by cardiology to 20 mg Stop Norvasc.  3. Morbid obesity. PTOT eval recommends patient will need SNF on discharge., Awaiting facility for transfer  4. Restless leg syndrome. Diabetic neuropathy Acute encephalopathy Increased sleepiness per patient and family Untreated sleep apnea. Chronic compensated hypercarbia on ABG  Continue Requip. Continue Lyrica. Discontinue doxepin. No evidence of focal deficit on examination. Normal CT head, ammonia, 0000000, folic acid, TSH. Encephalopathy is likely medication induced as well  as from untreated sleep apnea as her ABG in September 2016 was showing chronic compensated hypercarbia. Appreciate pulmonary consult. Patient will be started on BiPAP daily at bedtime. Outpatient sleep study.  5. GERD. Continue PPI.  6. History of seizures. Continue Keppra.  7. Type 2 diabetes mellitus. Hemoglobin A1c April 17 9.6. Continue sliding scale insulin. Continue Lantus 15 units daily at bedtime. Blood sugars adequately controlled.  8. Atrial fibrillation CHA2DS2-VASc Score 8 Patient is currently rate controlled We'll continue Apixaban per pharmacy Continue carvedilol   Pain management: When necessary Tylenol Activity: SNF per physical therapy Bowel regimen: last BM 09/25/2015, MiraLAX added Diet: Cardiac diet carb modified DVT Prophylaxis: on therapeutic anticoagulation.  Advance goals of care discussion: DNR/DNI  Family Communication: no family was present at bedside, at the time of interview.   Disposition:  Discharge to SNF, expected discharge date pending facility arrangement.  Consultants: Cardiology, pulmonary Procedures: None  Antibiotics: Anti-infectives    None        Intake/Output Summary (Last 24 hours) at 09/28/15 1642 Last data filed at 09/28/15 1413  Gross per 24 hour  Intake    900 ml  Output      0 ml  Net    900 ml   Filed Weights   09/26/15 0626 09/27/15 0600 09/28/15 0300  Weight: 155.085 kg (341 lb 14.4 oz) 153.815 kg (339 lb 1.6 oz) 152.817 kg (336 lb 14.4 oz)    Objective: Physical Exam: Filed Vitals:   09/27/15 2016 09/27/15 2234 09/28/15 0300 09/28/15 1223  BP: 100/57  115/66 122/73  Pulse: 91 86 66 81  Temp: 98.1 F (36.7 C)  98.1 F (36.7 C) 98.2 F (36.8 C)  TempSrc: Oral  Oral Oral  Resp: 20 18 20 20   Height:      Weight:   152.817 kg (336 lb 14.4 oz)   SpO2: 93% 93% 100% 94%   General: Alert, Awake and Oriented to Time, Place and Person. Appear in mild distress ENT: Oral Mucosa clear moist. Neck:  difficult to assess  JVD, no Abnormal Mass Or lumps Cardiovascular: S1 and S2 Present, aortic systolic  Murmur, Respiratory: Bilateral Air entry equal and Decreased, bilateral  Crackles, no wheezes Abdomen: Bowel Sound present, Soft and no tenderness Skin: no redness, no Rash  Extremities: bilateral Pedal edema, no calf tenderness  Data Reviewed: CBC:  Recent Labs Lab 09/22/15 1513 09/25/15 0440 09/26/15 0651 09/27/15 0318  WBC 3.4* 4.5 4.0 3.6*  NEUTROABS 1.9  --  2.5 2.1  HGB 12.2 11.3* 11.5* 11.5*  HCT 41.7 38.0 37.1 39.3  MCV 99.5 96.9 95.1 98.3  PLT 106* 120* 118* [id number]*   Basic Metabolic Panel:  Recent Labs Lab 09/24/15 0233 09/25/15 0440 09/26/15 0651 09/27/15 0318 09/28/15 0535  NA 139 138 138 138 137  K 4.3 4.3 4.0 4.1 3.8  CL 96* 94* 93* 91* 89*  CO2 38* 37* 38* 40* 38*  GLUCOSE 177* 177* 112* 215* 179*  BUN 17 20 19  24* 29*  CREATININE 0.86 0.99 0.90 1.13* 1.12*  CALCIUM 8.3* 8.3* 8.5* 8.8* 9.3  MG  --  1.6* 2.0 2.2 2.2  PHOS  --   --   --   --  3.1    Liver Function Tests:  Recent Labs Lab 09/22/15 1513 09/28/15 0535  AST 12*  --   ALT 8*  --   ALKPHOS 53  --   BILITOT 0.7  --   PROT 6.0*  --   ALBUMIN 2.8* 2.9*   No results for input(s): LIPASE, AMYLASE in the last 168 hours.  Recent Labs Lab 09/26/15 1638  AMMONIA 36*   Coagulation Profile: No results for input(s): INR, PROTIME in the last 168 hours. Cardiac Enzymes:  Recent Labs Lab 09/22/15 1513  TROPONINI <0.03   BNP (last 3 results) No results for input(s): PROBNP in the last 8760 hours.  CBG:  Recent Labs Lab 09/27/15 1705 09/27/15 2120 09/28/15 0603 09/28/15 1129 09/28/15 1628  GLUCAP 257* 219* 164* 228* 216*    Studies: No results found.   Scheduled Meds: . acetaZOLAMIDE  250 mg Oral Daily  . apixaban  5 mg Oral BID  . carvedilol  20 mg Oral Daily  . ferrous sulfate  325 mg Oral Q breakfast  . furosemide  40 mg Oral BID  . guaiFENesin  1,200 mg Oral  BID  . insulin aspart  0-15 Units Subcutaneous TID WC  . insulin glargine  15 Units Subcutaneous QHS  . levETIRAcetam  500 mg Oral BID  . pantoprazole  40 mg Oral BID AC  . polyethylene glycol  17 g Oral Daily  . potassium chloride  20 mEq Oral Daily  . pregabalin  75 mg Oral BID  . rOPINIRole  2 mg Oral TID  . sodium chloride flush  3 mL Intravenous Q12H   Continuous Infusions:  PRN Meds: sodium chloride, acetaminophen, albuterol, diclofenac sodium, ipratropium-albuterol, ondansetron (ZOFRAN) IV, sodium chloride flush  Time spent: 30 minutes  Author: Berle Mull, MD [facility] Pager: [phone] 09/28/2015 4:42 PM  If 7PM-7AM, please contact night-coverage at [email or web address], password [facility]

## 2015-09-28 NOTE — Clinical Social Work Note (Signed)
CSW contacted patient's daughter, Elizabeth Pugh [phone]). [name] unable to take patient at this time due to influx of admissions. Patient's daughter accepted bed offer at [facility]. CSW contacted [facility] and spoke with Sharyn Lull. Told her that patient had accepted bed offer and anticipated discharge is today pending improvement in volume status and tolerance to Bipap. Sharyn Lull is not doing admissions today but will give message to who is and they will contact CSW.  Dayton Scrape, [place]

## 2015-09-29 LAB — BASIC METABOLIC PANEL
Anion gap: 8 (ref 5–15)
BUN: 38 mg/dL — AB (ref 6–20)
CO2: 37 mmol/L — ABNORMAL HIGH (ref 22–32)
CREATININE: 1.31 mg/dL — AB (ref 0.44–1.00)
Calcium: 9.2 mg/dL (ref 8.9–10.3)
Chloride: 92 mmol/L — ABNORMAL LOW (ref 101–111)
GFR calc Af Amer: 41 mL/min — ABNORMAL LOW (ref >60)
GFR, EST NON AFRICAN AMERICAN: 35 mL/min — AB (ref >60)
Glucose, Bld: 248 mg/dL — ABNORMAL HIGH (ref 65–99)
Potassium: 3.7 mmol/L (ref 3.5–5.1)
SODIUM: 137 mmol/L (ref 135–145)

## 2015-09-29 LAB — GLUCOSE, CAPILLARY
GLUCOSE-CAPILLARY: 193 mg/dL — AB (ref 65–99)
GLUCOSE-CAPILLARY: 179 mg/dL — AB (ref 65–99)
Glucose-Capillary: 222 mg/dL — ABNORMAL HIGH (ref 65–99)
Glucose-Capillary: 205 mg/dL — ABNORMAL HIGH (ref 65–99)

## 2015-09-29 LAB — MAGNESIUM: MAGNESIUM: 2.1 mg/dL (ref 1.7–2.4)

## 2015-09-29 MED ORDER — FUROSEMIDE 20 MG PO TABS
60.0000 mg | ORAL_TABLET | Freq: Every day | ORAL | Status: DC
  Administered 2015-09-29 – 2015-10-01 (×3): 60 mg via ORAL
  Filled 2015-09-29 (×3): qty 1

## 2015-09-29 NOTE — Progress Notes (Signed)
[facility] is unable to order CPAP machine for patient until Tuesday when facility opens again due to holiday weekend.  Elizabeth Pugh LCSWA [phone]

## 2015-09-29 NOTE — Progress Notes (Signed)
[facility] Progress Note  Patient: Elizabeth Pugh [id number]   PCP: Philis Fendt, MD DOB: 1927/03/27   DOA: 09/22/2015   DOS: 09/29/2015   Date of Service: the patient was seen and examined on 09/29/2015  Subjective: Patient has some increased sleepiness this morning. Her nausea and vomiting. Continue to use BiPAP daily at bedtime. Nutrition: Tolerating oral diet  Brief hospital course: Patient with past medical history of chronic diastolic CHF, diabetes mellitus, hypertension was admitted on 09/22/2015, with complaint of bilateral leg swelling, was found to have Acute on chronic diastolic CHF. Patient was started on IV Lasix as well as cardiology was consulted. Currently further plan is continue BiPAP daily at bedtime and identify the facility for transfer.  Assessment and Plan: 1. Acute on chronic diastolic (congestive) heart failure ([place]) Echo in September 16 shows EF of 0000000 with diastolic dysfunction. Baseline weight 270 pound, admission weight 355 pounds. Weight today 333 pound. Ins and outs are not accurate   Continue Coreg , dose reduced by cardiology 20 mg  Continue oral Lasix. Patient was placed on Zaroxolyn but due to increase in the serum creatinine I would discontinue it, also reducing Lasix from 40 twice a day to 60 daily. Holding Micardis. Continue unna boot, change once a week  2. Essential hypertension. Continue Coreg. Dose reduced by cardiology to 20 mg Stop Norvasc.  3. Morbid obesity. PTOT eval recommends patient will need SNF on discharge., Awaiting facility for transfer  4. Restless leg syndrome. Diabetic neuropathy Acute encephalopathy Increased sleepiness per patient and family Untreated sleep apnea. Chronic compensated hypercarbia on ABG  Continue Requip. Dose reduced from 4 times a day to 3 times a day Continue Lyrica. Discontinue doxepin. No evidence of focal deficit on examination. Normal CT head, ammonia, 0000000, folic acid,  TSH. Encephalopathy is likely medication induced as well as from untreated sleep apnea as her ABG in September 2016 was showing chronic compensated hypercarbia. Appreciate pulmonary consult. Patient will be started on BiPAP daily at bedtime. Outpatient sleep study.  5. GERD. Continue PPI.  6. History of seizures. Continue Keppra.  7. Type 2 diabetes mellitus. Hemoglobin A1c April 17 9.6. Continue sliding scale insulin. Continue Lantus 18 units daily at bedtime. Blood sugars adequately controlled.  8. Atrial fibrillation CHA2DS2-VASc Score 8 Patient is currently rate controlled We'll continue Apixaban per pharmacy Continue carvedilol   Pain management: When necessary Tylenol Activity: SNF per physical therapy Bowel regimen: last BM 09/25/2015, MiraLAX added Diet: Cardiac diet carb modified DVT Prophylaxis: on therapeutic anticoagulation.  Advance goals of care discussion: DNR/DNI  Family Communication: no family was present at bedside, at the time of interview.   Disposition:  Discharge to SNF, expected discharge date pending facility arrangement.  Consultants: Cardiology, pulmonary Procedures: None  Antibiotics: Anti-infectives    None        Intake/Output Summary (Last 24 hours) at 09/29/15 1643 Last data filed at 09/29/15 1447  Gross per 24 hour  Intake    960 ml  Output      0 ml  Net    960 ml   Filed Weights   09/27/15 0600 09/28/15 0300 09/29/15 0421  Weight: 153.815 kg (339 lb 1.6 oz) 152.817 kg (336 lb 14.4 oz) 151.093 kg (333 lb 1.6 oz)    Objective: Physical Exam: Filed Vitals:   09/28/15 2006 09/28/15 2100 09/29/15 0421 09/29/15 1224  BP: 105/66  105/59 102/52  Pulse: 85  88 87  Temp: 99.1 F (37.3 C)  97.3  F (36.3 C) 98.1 F (36.7 C)  TempSrc: Oral  Axillary Oral  Resp: 19  19 20   Height:      Weight:   151.093 kg (333 lb 1.6 oz)   SpO2: 91% 96% 96% 95%   General: Alert, Awake and Oriented to Time, Place and Person. Appear in mild  distress ENT: Oral Mucosa clear moist. Neck: difficult to assess  JVD, no Abnormal Mass Or lumps Cardiovascular: S1 and S2 Present, aortic systolic  Murmur, Respiratory: Bilateral Air entry equal and Decreased, Minimal Crackles, no wheezes Abdomen: Bowel Sound present, Soft and no tenderness Skin: no redness, no Rash  Extremities: bilateral Pedal edema improving, no calf tenderness  Data Reviewed: CBC:  Recent Labs Lab 09/25/15 0440 09/26/15 0651 09/27/15 0318  WBC 4.5 4.0 3.6*  NEUTROABS  --  2.5 2.1  HGB 11.3* 11.5* 11.5*  HCT 38.0 37.1 39.3  MCV 96.9 95.1 98.3  PLT 120* 118* [id number]*   Basic Metabolic Panel:  Recent Labs Lab 09/25/15 0440 09/26/15 0651 09/27/15 0318 09/28/15 0535 09/29/15 0459  NA 138 138 138 137 137  K 4.3 4.0 4.1 3.8 3.7  CL 94* 93* 91* 89* 92*  CO2 37* 38* 40* 38* 37*  GLUCOSE 177* 112* 215* 179* 248*  BUN 20 19 24* 29* 38*  CREATININE 0.99 0.90 1.13* 1.12* 1.31*  CALCIUM 8.3* 8.5* 8.8* 9.3 9.2  MG 1.6* 2.0 2.2 2.2 2.1  PHOS  --   --   --  3.1  --     Liver Function Tests:  Recent Labs Lab 09/28/15 0535  ALBUMIN 2.9*   No results for input(s): LIPASE, AMYLASE in the last 168 hours.  Recent Labs Lab 09/26/15 1638  AMMONIA 36*   Coagulation Profile: No results for input(s): INR, PROTIME in the last 168 hours. Cardiac Enzymes: No results for input(s): CKTOTAL, CKMB, CKMBINDEX, TROPONINI in the last 168 hours. BNP (last 3 results) No results for input(s): PROBNP in the last 8760 hours.  CBG:  Recent Labs Lab 09/28/15 1628 09/28/15 2118 09/29/15 0658 09/29/15 1144 09/29/15 1625  GLUCAP 216* 266* 222* 205* 193*    Studies: No results found.   Scheduled Meds: . acetaZOLAMIDE  250 mg Oral Daily  . apixaban  5 mg Oral BID  . carvedilol  20 mg Oral Daily  . ferrous sulfate  325 mg Oral Q breakfast  . furosemide  60 mg Oral Daily  . guaiFENesin  1,200 mg Oral BID  . insulin aspart  0-15 Units Subcutaneous TID WC  .  insulin glargine  18 Units Subcutaneous QHS  . levETIRAcetam  500 mg Oral BID  . pantoprazole  40 mg Oral BID AC  . polyethylene glycol  17 g Oral Daily  . potassium chloride  20 mEq Oral Daily  . pregabalin  75 mg Oral BID  . rOPINIRole  2 mg Oral TID  . sodium chloride flush  3 mL Intravenous Q12H   Continuous Infusions:  PRN Meds: sodium chloride, acetaminophen, albuterol, diclofenac sodium, ipratropium-albuterol, ondansetron (ZOFRAN) IV, sodium chloride flush  Time spent: 30 minutes  Author: Berle Mull, MD [facility] Pager: [phone] 09/29/2015 4:43 PM  If 7PM-7AM, please contact night-coverage at [email or web address], password [facility]

## 2015-09-30 LAB — BASIC METABOLIC PANEL
ANION GAP: 7 (ref 5–15)
BUN: 38 mg/dL — ABNORMAL HIGH (ref 6–20)
CALCIUM: 9.1 mg/dL (ref 8.9–10.3)
CHLORIDE: 95 mmol/L — AB (ref 101–111)
CO2: 38 mmol/L — AB (ref 22–32)
CREATININE: 1.28 mg/dL — AB (ref 0.44–1.00)
GFR calc non Af Amer: 36 mL/min — ABNORMAL LOW (ref >60)
GFR, EST AFRICAN AMERICAN: 42 mL/min — AB (ref >60)
Glucose, Bld: 195 mg/dL — ABNORMAL HIGH (ref 65–99)
Potassium: 3.6 mmol/L (ref 3.5–5.1)
SODIUM: 140 mmol/L (ref 135–145)

## 2015-09-30 LAB — GLUCOSE, CAPILLARY
GLUCOSE-CAPILLARY: 169 mg/dL — AB (ref 65–99)
GLUCOSE-CAPILLARY: 227 mg/dL — AB (ref 65–99)
GLUCOSE-CAPILLARY: 221 mg/dL — AB (ref 65–99)
Glucose-Capillary: 196 mg/dL — ABNORMAL HIGH (ref 65–99)

## 2015-09-30 LAB — LEVETIRACETAM LEVEL: Levetiracetam Lvl: 35.6 ug/mL (ref 10.0–40.0)

## 2015-09-30 LAB — MAGNESIUM: MAGNESIUM: 2.2 mg/dL (ref 1.7–2.4)

## 2015-09-30 MED ORDER — HYDROXYZINE HCL 10 MG PO TABS
10.0000 mg | ORAL_TABLET | Freq: Once | ORAL | Status: AC
  Administered 2015-10-01: 10 mg via ORAL
  Filled 2015-09-30 (×2): qty 1

## 2015-09-30 MED ORDER — CARVEDILOL 3.125 MG PO TABS
3.1250 mg | ORAL_TABLET | Freq: Two times a day (BID) | ORAL | Status: DC
  Administered 2015-10-01 (×2): 3.125 mg via ORAL
  Filled 2015-09-30 (×2): qty 1

## 2015-09-30 NOTE — Care Management Important Message (Signed)
Important Message  Patient Details  Name: Elizabeth Pugh MRN: [id number] Date of Birth: February 19, 1927   Medicare Important Message Given:  Yes    [name] [name] 09/30/2015, 9:50 AM

## 2015-09-30 NOTE — Progress Notes (Signed)
Pt. seen for bipap h/s, staff in to bath, plan to stop back afterwards, RT to monitor.

## 2015-09-30 NOTE — Progress Notes (Signed)
[facility] Progress Note  Patient: Elizabeth Pugh [id number]   PCP: Philis Fendt, MD DOB: Jan 12, 1927   DOA: 09/22/2015   DOS: 09/30/2015   Date of Service: the patient was seen and examined on 09/30/2015  Subjective: Continue to tolerate BiPAP. No other acute complaints. No nausea or vomiting. No chest pain. Nutrition: Tolerating oral diet  Brief hospital course: Patient with past medical history of chronic diastolic CHF, diabetes mellitus, hypertension was admitted on 09/22/2015, with complaint of bilateral leg swelling, was found to have Acute on chronic diastolic CHF. Patient was started on IV Lasix as well as cardiology was consulted. Currently further plan is continue BiPAP daily at bedtime and identify the facility for transfer.  Assessment and Plan: 1. Acute on chronic diastolic (congestive) heart failure ([place]) Echo in September 16 shows EF of 0000000 with diastolic dysfunction. Baseline weight 270 pound, admission weight 355 pounds. Weight today 283 pound likely inaccurate.  Ins and outs are not accurate   Continue Coreg , dose reduced by cardiology 20 mg  Continue oral Lasix. Patient was placed on Zaroxolyn but due to increase in the serum creatinine I would discontinue it, also reducing Lasix from 40 twice a day to 60 daily. Holding Micardis. Continue unna boot, change once a week  2. Essential hypertension. Continue Coreg. Dose reduced by cardiology to 20 mg Stop Norvasc.  3. Morbid obesity. PTOT eval recommends patient will need SNF on discharge., Awaiting facility for transfer  4. Restless leg syndrome. Diabetic neuropathy Acute encephalopathy Increased sleepiness per patient and family Untreated sleep apnea. Chronic compensated hypercarbia on ABG  Continue Requip. Dose reduced from 4 times a day to 3 times a day Continue Lyrica. Discontinue doxepin. No evidence of focal deficit on examination. Normal CT head, ammonia, 0000000, folic acid,  TSH. Encephalopathy is likely medication induced as well as from untreated sleep apnea as her ABG in September 2016 was showing chronic compensated hypercarbia. Appreciate pulmonary consult. Patient will be started on BiPAP daily at bedtime. Outpatient sleep study.  5. GERD. Continue PPI.  6. History of seizures. Continue Keppra.  7. Type 2 diabetes mellitus. Hemoglobin A1c April 17 9.6. Continue sliding scale insulin. Continue Lantus 18 units daily at bedtime. Blood sugars adequately controlled.  8. Atrial fibrillation CHA2DS2-VASc Score 8 Patient is currently rate controlled We'll continue Apixaban per pharmacy Continue carvedilol   Pain management: When necessary Tylenol Activity: SNF per physical therapy Bowel regimen: last BM 09/25/2015, MiraLAX added Diet: Cardiac diet carb modified DVT Prophylaxis: on therapeutic anticoagulation.  Advance goals of care discussion: DNR/DNI  Family Communication: no family was present at bedside, at the time of interview.   Disposition:  Discharge to SNF, expected discharge date 10/01/2015.  Consultants: Cardiology, pulmonary Procedures: None  Antibiotics: Anti-infectives    None      Intake/Output Summary (Last 24 hours) at 09/30/15 1815 Last data filed at 09/30/15 1443  Gross per 24 hour  Intake    840 ml  Output      0 ml  Net    840 ml   Filed Weights   09/29/15 0421 09/30/15 1057 09/30/15 1532  Weight: 151.093 kg (333 lb 1.6 oz) 127.914 kg (282 lb) 128.64 kg (283 lb 9.6 oz)    Objective: Physical Exam: Filed Vitals:   09/30/15 0710 09/30/15 1057 09/30/15 1248 09/30/15 1532  BP: 115/57  93/30   Pulse: 74  88   Temp: 97.9 F (36.6 C)  98.4 F (36.9 C)   TempSrc:  Oral  Oral   Resp: 19  18   Height:      Weight:  127.914 kg (282 lb)  128.64 kg (283 lb 9.6 oz)  SpO2: 98%  94%    General: Alert, Awake and Oriented to Time, Place and Person. Appear in mild distress ENT: Oral Mucosa clear moist. Neck:  difficult to assess  JVD, no Abnormal Mass Or lumps Cardiovascular: S1 and S2 Present, aortic systolic  Murmur, Respiratory: Bilateral Air entry equal and Decreased, Minimal Crackles, no wheezes Abdomen: Bowel Sound present, Soft and no tenderness Skin: no redness, no Rash  Extremities: bilateral Pedal edema improving, no calf tenderness  Data Reviewed: CBC:  Recent Labs Lab 09/25/15 0440 09/26/15 0651 09/27/15 0318  WBC 4.5 4.0 3.6*  NEUTROABS  --  2.5 2.1  HGB 11.3* 11.5* 11.5*  HCT 38.0 37.1 39.3  MCV 96.9 95.1 98.3  PLT 120* 118* [id number]*   Basic Metabolic Panel:  Recent Labs Lab 09/26/15 0651 09/27/15 0318 09/28/15 0535 09/29/15 0459 09/30/15 0341  NA 138 138 137 137 140  K 4.0 4.1 3.8 3.7 3.6  CL 93* 91* 89* 92* 95*  CO2 38* 40* 38* 37* 38*  GLUCOSE 112* 215* 179* 248* 195*  BUN 19 24* 29* 38* 38*  CREATININE 0.90 1.13* 1.12* 1.31* 1.28*  CALCIUM 8.5* 8.8* 9.3 9.2 9.1  MG 2.0 2.2 2.2 2.1 2.2  PHOS  --   --  3.1  --   --     Liver Function Tests:  Recent Labs Lab 09/28/15 0535  ALBUMIN 2.9*   No results for input(s): LIPASE, AMYLASE in the last 168 hours.  Recent Labs Lab 09/26/15 1638  AMMONIA 36*   Coagulation Profile: No results for input(s): INR, PROTIME in the last 168 hours. Cardiac Enzymes: No results for input(s): CKTOTAL, CKMB, CKMBINDEX, TROPONINI in the last 168 hours. BNP (last 3 results) No results for input(s): PROBNP in the last 8760 hours.  CBG:  Recent Labs Lab 09/29/15 1625 09/29/15 2152 09/30/15 0655 09/30/15 1132 09/30/15 1613  GLUCAP 193* 179* 169* 227* 196*   Studies: No results found.   Scheduled Meds: . acetaZOLAMIDE  250 mg Oral Daily  . apixaban  5 mg Oral BID  . carvedilol  20 mg Oral Daily  . ferrous sulfate  325 mg Oral Q breakfast  . furosemide  60 mg Oral Daily  . guaiFENesin  1,200 mg Oral BID  . hydrOXYzine  10 mg Oral Once  . insulin aspart  0-15 Units Subcutaneous TID WC  . insulin glargine  18  Units Subcutaneous QHS  . levETIRAcetam  500 mg Oral BID  . pantoprazole  40 mg Oral BID AC  . polyethylene glycol  17 g Oral Daily  . potassium chloride  20 mEq Oral Daily  . pregabalin  75 mg Oral BID  . rOPINIRole  2 mg Oral TID  . sodium chloride flush  3 mL Intravenous Q12H   Continuous Infusions:  PRN Meds: sodium chloride, acetaminophen, albuterol, diclofenac sodium, ipratropium-albuterol, ondansetron (ZOFRAN) IV, sodium chloride flush  Time spent: 30 minutes  Author: Berle Mull, MD [facility] Pager: [phone] 09/30/2015 6:15 PM  If 7PM-7AM, please contact night-coverage at [email or web address], password [email or web address]

## 2015-09-30 NOTE — Progress Notes (Signed)
Pt. placed on BiPAP with FFM, humidity filled, pt. tolerated well, placed 3 lpm oxygen into circuit, RT to monitor.

## 2015-10-01 LAB — GLUCOSE, CAPILLARY
GLUCOSE-CAPILLARY: 274 mg/dL — AB (ref 65–99)
GLUCOSE-CAPILLARY: 224 mg/dL — AB (ref 65–99)
Glucose-Capillary: 220 mg/dL — ABNORMAL HIGH (ref 65–99)

## 2015-10-01 MED ORDER — FUROSEMIDE 20 MG PO TABS: 60.0000 mg | ORAL_TABLET | Freq: Every day | ORAL | Status: DC

## 2015-10-01 MED ORDER — CARVEDILOL 3.125 MG PO TABS: 3.1250 mg | ORAL_TABLET | Freq: Two times a day (BID) | ORAL | Status: DC

## 2015-10-01 MED ORDER — PANTOPRAZOLE SODIUM 40 MG PO TBEC
40.0000 mg | DELAYED_RELEASE_TABLET | Freq: Two times a day (BID) | ORAL | Status: AC
Start: 2015-10-01 — End: ?

## 2015-10-01 NOTE — NC FL2 (Signed)
[place] LEVEL OF CARE SCREENING TOOL     IDENTIFICATION  Patient Name: Elizabeth Pugh Birthdate: 10-28-1926 Sex: female Admission Date (Current Location): 09/22/2015  [facility] and [place] Number:  Herbalist and Address:  [facility] [place], [place] [address] [place], [place] [address]      Provider Number: [id number]  Attending Physician Name and Address:  Lavina Hamman, MD  Relative Name and Phone Number:       Current Level of Care: Hospital Recommended Level of Care: [place] Prior Approval Number:    Date Approved/Denied:   PASRR Number: [id number]  Discharge Plan: SNF    Current Diagnoses: Patient Active Problem List   Diagnosis Date Noted  . Chronic respiratory failure with hypoxia and hypercapnia ([place]) 09/28/2015  . Acute encephalopathy   . Acute on chronic congestive heart failure ([place])   . Needs sleep apnea assessment   . Acute on chronic diastolic (congestive) heart failure ([place]) 09/22/2015  . Acute kidney injury ([place]) 09/02/2015  . Lobar pneumonia due to unspecified organism 09/02/2015  . Bilateral leg edema   . Acute respiratory failure with hypoxia ([place]) 08/29/2015  . Acute bronchitis 08/29/2015  . Acute hyperkalemia 08/29/2015  . History of pulmonary embolism 01/27/2015  . Chest pain at rest 01/27/2015  . Shortness of breath at rest 01/27/2015  . Chronic diastolic heart failure ([place]) 01/27/2015  . Thrombocytopenia ([place]) 01/27/2015  . Abnormal findings on radiological examination of gastrointestinal tract 01/27/2015  . Chronic atrial fibrillation ([place])   . Seizure disorder ([place])   . Anxiety   . Anemia   . Restless leg syndrome   . Acute delirium 04/12/2011  . DM (diabetes mellitus), type 2, uncontrolled with complications ([place]) 0000000  . CVA, old, hemiparesis ([place]) 03/25/2011  . Hypertension 03/25/2011    Orientation RESPIRATION BLADDER Height & Weight     Self, Time, Situation,  Place  Other (Comment), O2 (Bipap: Respiratory rate 17 breaths per min, IPAP 8 cmH2O, EPAP 4 cmH2O, Flow Rate 3 lpm, Nasal Canula 3 L) Indwelling catheter Weight: 277 lb 11.2 oz (125.964 kg) (bed scale) Height:  5\' 7"  (170.2 cm) (pt states)  BEHAVIORAL SYMPTOMS/MOOD NEUROLOGICAL BOWEL NUTRITION STATUS      Incontinent Diet (carb modified)  AMBULATORY STATUS COMMUNICATION OF NEEDS Skin   Total Care Verbally (difficult to understand) Normal                       Personal Care Assistance Level of Assistance  Bathing, Dressing Bathing Assistance: Maximum assistance   Dressing Assistance: Maximum assistance     Functional Limitations Info             SPECIAL CARE FACTORS FREQUENCY  PT (By licensed PT), OT (By licensed OT)     PT Frequency: 5/wk OT Frequency: 5/wk            Contractures      Additional Factors Info  Code Status, Allergies, Insulin Sliding Scale Code Status Info: DNR Allergies Info: Erythromycin, Penicillins, Zithromax   Insulin Sliding Scale Info: 3/day       Current Medications (10/01/2015):  This is the current hospital active medication list Current Facility-Administered Medications  Medication Dose Route Frequency Provider Last Rate Last Dose  . 0.9 %  sodium chloride infusion  250 mL Intravenous PRN Etta Quill, DO      . acetaminophen (TYLENOL) tablet 650 mg  650 mg Oral Q4H PRN  Etta Quill, DO      . acetaZOLAMIDE (DIAMOX) tablet 250 mg  250 mg Oral Daily Charolette Forward, MD   250 mg at 09/30/15 0949  . albuterol (PROVENTIL) (2.5 MG/3ML) 0.083% nebulizer solution 3 mL  3 mL Inhalation Q6H PRN Etta Quill, DO      . apixaban (ELIQUIS) tablet 5 mg  5 mg Oral BID Thurnell Lose, MD   5 mg at 09/30/15 2200  . carvedilol (COREG) tablet 3.125 mg  3.125 mg Oral BID WC Lavina Hamman, MD   3.125 mg at 10/01/15 [id number]  . diclofenac sodium (VOLTAREN) 1 % transdermal gel 1 application  1 application Topical QID PRN Etta Quill, DO      .  ferrous sulfate tablet 325 mg  325 mg Oral Q breakfast Etta Quill, DO   325 mg at 10/01/15 [id number]  . furosemide (LASIX) tablet 60 mg  60 mg Oral Daily Lavina Hamman, MD   60 mg at 09/30/15 0949  . guaiFENesin (MUCINEX) 12 hr tablet 1,200 mg  1,200 mg Oral BID Etta Quill, DO   1,200 mg at 09/30/15 2200  . insulin aspart (novoLOG) injection 0-15 Units  0-15 Units Subcutaneous TID WC Thurnell Lose, MD   8 Units at 10/01/15 [phone]  . insulin glargine (LANTUS) injection 18 Units  18 Units Subcutaneous QHS Lavina Hamman, MD   18 Units at 09/30/15 2200  . ipratropium-albuterol (DUONEB) 0.5-2.5 (3) MG/3ML nebulizer solution 3 mL  3 mL Nebulization Q6H PRN Etta Quill, DO      . levETIRAcetam (KEPPRA) tablet 500 mg  500 mg Oral BID Etta Quill, DO   500 mg at 09/30/15 2200  . ondansetron (ZOFRAN) injection 4 mg  4 mg Intravenous Q6H PRN Etta Quill, DO      . pantoprazole (PROTONIX) EC tablet 40 mg  40 mg Oral BID AC Tanda Rockers, MD   40 mg at 10/01/15 [id number]  . polyethylene glycol (MIRALAX / GLYCOLAX) packet 17 g  17 g Oral Daily Lavina Hamman, MD   17 g at 09/29/15 [id number]  . potassium chloride SA (K-DUR,KLOR-CON) CR tablet 20 mEq  20 mEq Oral Daily Thurnell Lose, MD   20 mEq at 09/30/15 0949  . pregabalin (LYRICA) capsule 75 mg  75 mg Oral BID Etta Quill, DO   75 mg at 09/30/15 2200  . rOPINIRole (REQUIP) tablet 2 mg  2 mg Oral TID Lavina Hamman, MD   2 mg at 09/30/15 2200  . sodium chloride flush (NS) 0.9 % injection 3 mL  3 mL Intravenous Q12H Etta Quill, DO   3 mL at 09/30/15 2201  . sodium chloride flush (NS) 0.9 % injection 3 mL  3 mL Intravenous PRN Etta Quill, DO         Discharge Medications: Please see discharge summary for a list of discharge medications.  Relevant Imaging Results:  Relevant Lab Results:   Additional Information SS#: [id number]  Candie Chroman, LCSW

## 2015-10-01 NOTE — Clinical Social Work Note (Addendum)
Updated FL2 sent to [facility] with [facility] settings.  Dayton Scrape, CSW [phone]  11:11 am CSW contacted patient's daughter. She will go to SNF at 1:00 to fill out paperwork.  Dayton Scrape, [place] [phone]  12:03 pm [facility] will be at SNF later this afternoon.  Dayton Scrape, [place]

## 2015-10-01 NOTE — Clinical Social Work Note (Signed)
CSW facilitated patient discharge including contacting patient family and facility to confirm patient discharge plans. Clinical information faxed to facility and family agreeable with plan. CSW arranged ambulance transport via [facility] to [facility]. RN to call report prior to discharge [phone] Room 504).  CSW will sign off for now as social work intervention is no longer needed. Please consult [place] again if new needs arise.  Dayton Scrape, [place]

## 2015-10-01 NOTE — Progress Notes (Signed)
Physical Therapy Treatment Patient Details Name: Elizabeth Pugh MRN: [id number] DOB: [birth year] Today's Date: 10/01/2015    History of Present Illness Pt is a 82 y/o F who presented w/ Bil LE edema.  Found to have mild CHF and pulmonary edema on imaging studies.  Pt's PMH includes chronic diastolic CHF, stroke, anxiety, anemia, seizures, a-fib.    PT Comments    Assessed rolling with pt able to roll L with less A than to the R.  Pt very de-conditioned.  Plan is to go SNF, probably today.  Follow Up Recommendations  SNF     Equipment Recommendations  None recommended by PT    Recommendations for Other Services       Precautions / Restrictions Precautions Precautions: Fall Precaution Comments: monitor O2    Mobility  Bed Mobility Overal bed mobility: Needs Assistance Bed Mobility: Rolling Rolling: Total assist;Max assist         General bed mobility comments: Pt able to reach for rails with rolling.  TOT A to the R, and MAX A to L with ability to use R LE and lift it over to help gets hips turned to A with rolling L.  Transfers                    Ambulation/Gait                 Stairs            Wheelchair Mobility    Modified Rankin (Stroke Patients Only)       Balance                                    Cognition Arousal/Alertness: Awake/alert Behavior During Therapy: WFL for tasks assessed/performed Overall Cognitive Status: Within Functional Limits for tasks assessed                      Exercises General Exercises - Upper Extremity Shoulder Flexion: AROM;Both;10 reps General Exercises - Lower Extremity Ankle Circles/Pumps: AROM;Both;Supine;10 reps Gluteal Sets: 10 reps Heel Slides: 5 reps (limited ROM) Hip ABduction/ADduction: AAROM;AROM;Both;10 reps;Supine    General Comments        Pertinent Vitals/Pain Pain Assessment: Faces Faces Pain Scale: Hurts even more Pain Location: legs Pain  Descriptors / Indicators: Grimacing;Heaviness Pain Intervention(s): Limited activity within patient's tolerance    Home Living                      Prior Function            PT Goals (current goals can now be found in the care plan section) Acute Rehab PT Goals Patient Stated Goal: to go to rehab before home PT Goal Formulation: With patient Time For Goal Achievement: 10/08/15 Potential to Achieve Goals: Fair Progress towards PT goals: Progressing toward goals    Frequency  Min 2X/week    PT Plan Current plan remains appropriate    Co-evaluation             End of Session Equipment Utilized During Treatment: Oxygen Activity Tolerance: Patient limited by fatigue Patient left: in bed;with call bell/phone within reach     Time: [id number] PT Time Calculation (min) (ACUTE ONLY): 12 min  Charges:  $Therapeutic Activity: 8-22 mins                    G  Codes:      Elizabeth Pugh 10/01/2015, 8:56 AM

## 2015-10-01 NOTE — Discharge Summary (Signed)
[facility] Discharge Summary   Patient: Elizabeth Pugh [id number]   PCP: Philis Fendt, MD DOB: [birth year]   Date of admission: 09/22/2015   Date of discharge:  10/01/2015    Discharge Diagnoses:  Principal Problem:   Acute on chronic diastolic (congestive) heart failure ([place]) Active Problems:   DM (diabetes mellitus), type 2, uncontrolled with complications ([place])   Hypertension   Acute encephalopathy   Acute on chronic congestive heart failure ([place])   Needs sleep apnea assessment   Chronic respiratory failure with hypoxia and hypercapnia ([place])  Recommendations for Outpatient Follow-up:  1. Continue follow-up with PCP in one week. Recheck BMP in 1 week. 2. Follow-up with Dr. Terrence Dupont for cardiology in 1-2 weeks. 3. Establish care with pulmonology as an outpatient for sleep apnea. 4. Get sleep study as an outpatient. Remain compliant with BiPAP For Heart failure patients -  Check your Weight same time everyday If you gain over 2 pounds, or you develop in leg swelling, experience more shortness of breath or chest pain, call your Primary MD immediately. Avoid adding extra salt in the food, food high in salt and Follow 1.5 lit/day fluid restriction.   Follow-up Information    Follow up with Philis Fendt, MD. Schedule an appointment as soon as possible for a visit in 1 week.   Specialty:  Internal Medicine   Contact information:   [address] [place] [address] [phone]       Follow up with Charolette Forward, MD. Schedule an appointment as soon as possible for a visit in 2 weeks.   Specialty:  Cardiology   Contact information:   [place] [id number] [phone]       Follow up with [facility] .   Specialty:  [place] information:   [place] [phone]      Follow up with [facility]. Call in 1 week.   Specialty:  Pulmonology   Why:  for follow up appointment.    Contact information:   [place] [phone]     Diet recommendation: Cardiac diet carb modified  Activity: The patient is advised to gradually reintroduce usual activities.  Discharge Condition: fair  History of present illness: As per the H and P dictated on admission, "Elizabeth Pugh is a 82 y.o. female with medical history significant of Chronic diastolic CHF, DM, HTN. Patient presents to the ED with BLE edema that has been slowly worsening since discharge from the hospital on the first of the month. Patient was taken off of her lasix at the time of admission to the hospital and it was not restarted due to pre-renal azotemia at that time. She has associated SOB. 80% O2 on room air, 94% on 2L. Patient treated for bronchitis and PNA 3 weeks ago.  ED Course: Found to have mild CHF and pulmonary edema on imaging studies in ED."  Hospital Course:   Summary of her active problems in the hospital is as following. 1. Acute on chronic diastolic (congestive) heart failure ([place]) Echo in September 16 shows EF of 0000000 with diastolic dysfunction. Baseline weight 270 pound, admission weight 355 pounds. Weight today 277 pound Earlier weight likely inaccurate. Patient was given IV Lasix, later on also given Zaroxolyn 1 and later on was on 40 mg twice a day Lasix orally.  Continue Coreg , dose reduced by cardiology  20 mg- Further reduce her dose due to hypotension 3.125 mg twice a day. Continue oral Lasix. Dose of 60 mg daily.  2. Essential hypertension. Continue Coreg. Dose reduced by cardiology to 20 mg-further reduce to 3.125 mg twice a day The patient stable after the change  Stop Norvasc and Micardis.  3. Morbid obesity. PTOT eval recommends patient will need SNF on discharge.  4. Untreated sleep apnea. Patient's ABG was suggestive of chronic up in Center  hypercarbia. Patient tolerated BiPAP initiation in the hospital. Pulmonary was consulted and recommended to continue auto setting BiPAP and get a sleep study as an outpatient. Settings of BiPAP: Respiratory rate 17 breaths per min, IPAP 8 cmH2O, EPAP 4 cmH2O, Flow Rate 3 lpm, Nasal Canula 3 L)  5. Restless leg syndrome. Diabetic neuropathy Acute encephalopathy Chronic compensated hypercarbia on ABG  Continue Requip. Dose reduced from 4 times a day to 3 times a day Continue Lyrica. Discontinue doxepin. No evidence of focal deficit on examination. Normal CT head, ammonia, 0000000, folic acid, TSH. Encephalopathy is likely medication induced as well as from untreated sleep apnea as her   5. GERD. Continue PPI. Change to twice a day before meals at present  6. History of seizures. Continue Keppra.  7. Type 2 diabetes mellitus. Hemoglobin A1c April 17 9.6. Continue sliding scale insulin. Continue Lantus 15 units daily at bedtime. Blood sugars adequately controlled.  8. Atrial fibrillation CHA2DS2-VASc Score 8 Patient is currently rate controlled We'll continue Apixaban per pharmacy Continue carvedilol   All other chronic medical condition were stable during the hospitalization.  Patient was seen by physical therapy, who recommended SNF, which was arranged by Education officer, museum and case Freight forwarder. On the day of the discharge the patient's vitals are stable, and no other acute medical condition were reported by patient. the patient was felt safe to be discharge at SNF with therapy.  Procedures and Results:  none   Consultations:  Cardiology  Pulmonary  DISCHARGE MEDICATION: Current Discharge Medication List    START taking these medications   Details  carvedilol (COREG) 3.125 MG tablet Take 1 tablet (3.125 mg total) by mouth 2 (two) times daily with a meal. Qty: 60 tablet, Refills: 0    polyethylene glycol (MIRALAX / GLYCOLAX) packet Take 17 g by mouth daily. Qty: 14 each,  Refills: 0    potassium chloride SA (K-DUR,KLOR-CON) 20 MEQ tablet Take 1 tablet (20 mEq total) by mouth daily. Qty: 10 tablet, Refills: 0      CONTINUE these medications which have CHANGED   Details  apixaban (ELIQUIS) 5 MG TABS tablet Take 1 tablet (5 mg total) by mouth 2 (two) times daily. Qty: 60 tablet, Refills: 0    furosemide (LASIX) 20 MG tablet Take 3 tablets (60 mg total) by mouth daily. Qty: 30 tablet, Refills: 0    insulin glargine (LANTUS) 100 UNIT/ML injection Inject 0.15 mLs (15 Units total) into the skin at bedtime. Qty: 10 mL, Refills: 0    pantoprazole (PROTONIX) 40 MG tablet Take 1 tablet (40 mg total) by mouth 2 (two) times daily before a meal. Qty: 60 tablet, Refills: 0    rOPINIRole (REQUIP) 2 MG tablet Take 1 tablet (2 mg total) by mouth 3 (three) times daily. Qty: 30 tablet, Refills: 0      CONTINUE these medications which have NOT CHANGED   Details  albuterol (PROVENTIL HFA;VENTOLIN HFA) 108 (90 BASE) MCG/ACT inhaler Inhale 1 puff into the lungs every 6 (six) hours as needed for  wheezing or shortness of breath.    ALPRAZolam (XANAX) 0.25 MG tablet Take 1 tablet (0.25 mg total) by mouth 2 (two) times daily as needed for anxiety. Qty: 30 tablet, Refills: 0    calcium-vitamin D (CALCIUM 500+D) 500-400 MG-UNIT per tablet Take 1 tablet by mouth 2 (two) times daily.    ferrous sulfate 325 (65 FE) MG tablet Take 325 mg by mouth daily with breakfast.    glimepiride (AMARYL) 2 MG tablet Take 2 mg by mouth daily before breakfast.    guaiFENesin (MUCINEX) 600 MG 12 hr tablet Take 2 tablets (1,200 mg total) by mouth 2 (two) times daily. Qty: 10 tablet, Refills: 0    insulin aspart (NOVOLOG) 100 UNIT/ML injection Inject 0-12 Units into the skin 3 (three) times daily before meals. Under 150 = 0 units, 150 - 200 = 2 units, 201- 250 = 4 units, 251- 300 = 6 units, 301- 350 = 8 units, 351-400 = 10 units, 401-450 = 12  units    ipratropium-albuterol (DUONEB) 0.5-2.5  (3) MG/3ML SOLN Take 3 mLs by nebulization every 6 (six) hours as needed. Qty: 360 mL, Refills: 0    levETIRAcetam (KEPPRA) 500 MG tablet Take 1 tablet (500 mg total) by mouth 2 (two) times daily. Qty: 30 tablet, Refills: 2    pregabalin (LYRICA) 75 MG capsule Take 75 mg by mouth 2 (two) times daily.    VOLTAREN 1 % GEL Apply 1 application topically 4 (four) times daily as needed (knee pain). For pain      STOP taking these medications     amLODipine (NORVASC) 5 MG tablet      carvedilol (COREG CR) 20 MG 24 hr capsule      doxepin (SINEQUAN) 25 MG capsule      hydrOXYzine (ATARAX/VISTARIL) 25 MG tablet      telmisartan (MICARDIS) 40 MG tablet        Allergies  Allergen Reactions  . Erythromycin Other (See Comments)    Vaginal itching  . Penicillins Rash    Has patient had a PCN reaction causing immediate rash, facial/tongue/throat swelling, SOB or lightheadedness with hypotension: No Has patient had a PCN reaction causing severe rash involving mucus membranes or skin necrosis: No Has patient had a PCN reaction that required hospitalization: No Has patient had a PCN reaction occurring within the last 10 years: No If all of the above answers are "NO", then may proceed with Cephalosporin use.  Marland Kitchen Zithromax [Azithromycin] Other (See Comments)    gave her a yeast infection.   Discharge Instructions    Ambulatory referral to Sleep Studies    Complete by:  As directed      Diet - low sodium heart healthy    Complete by:  As directed      Diet Carb Modified    Complete by:  As directed      Discharge instructions    Complete by:  As directed   It is important that you read following instructions as well as go over your medication list with RN to help you understand your care after this hospitalization.  Discharge Instructions: Please follow-up with PCP in one week  Please request your primary care physician to go over all Hospital Tests and Procedure/Radiological results at  the follow up,  Please get all Hospital records sent to your PCP by signing hospital release before you go home.   Do not take more than prescribed Pain, Sleep and Anxiety Medications. You were cared for  by a hospitalist during your hospital stay. If you have any questions about your discharge medications or the care you received while you were in the hospital after you are discharged, you can call the unit and ask to speak with the hospitalist on call if the hospitalist that took care of you is not available.  Once you are discharged, your primary care physician will handle any further medical issues. Please note that NO REFILLS for any discharge medications will be authorized once you are discharged, as it is imperative that you return to your primary care physician (or establish a relationship with a primary care physician if you do not have one) for your aftercare needs so that they can reassess your need for medications and monitor your lab values. You Must read complete instructions/literature along with all the possible adverse reactions/side effects for all the Medicines you take and that have been prescribed to you. Take any new Medicines after you have completely understood and accept all the possible adverse reactions/side effects. Wear Seat belts while driving. If you have smoked or chewed Tobacco in the last 2 yrs please stop smoking and/or stop any Recreational drug use.     Increase activity slowly    Complete by:  As directed           Discharge Exam: Filed Weights   09/30/15 1057 09/30/15 1532 10/01/15 0646  Weight: 127.914 kg (282 lb) 128.64 kg (283 lb 9.6 oz) 125.964 kg (277 lb 11.2 oz)   Filed Vitals:   09/30/15 2255 10/01/15 0623  BP:  110/60  Pulse: 84   Temp:    Resp: 17    General: Appear in mild distress, no Rash; Oral Mucosa moist. Cardiovascular: S1 and S2 Present, aortic systolic Murmur, difficult to assess JVD Respiratory: Bilateral Air entry present and  Clear to Auscultation, no Crackles, no wheezes Abdomen: Bowel Sound present, Soft and no tenderness Extremities: bilateral Pedal edema, no calf tenderness Neurology: Grossly no focal neuro deficit.  The results of significant diagnostics from this hospitalization (including imaging, microbiology, ancillary and laboratory) are listed below for reference.    Significant Diagnostic Studies: Dg Chest 2 View  09/22/2015  CLINICAL DATA:  82 year old with acute onset of shortness of breath yesterday. Current history of hypertension, diabetes and CHF. Former smoker. EXAM: CHEST  2 VIEW COMPARISON:  08/29/2015 and earlier, including CTA chest 01/27/2015. FINDINGS: AP semi-erect and lateral images were obtained. Cardiac silhouette markedly enlarged, unchanged. Thoracic aorta tortuous, unchanged. Hilar and mediastinal contours otherwise unremarkable. Loculated pleural effusion posteriorly on the right with extension into the major fissure, markedly increased in size since September, 2016 where it was not visible on the x-ray but was present on the CT. No visible left pleural effusion. Lungs otherwise clear. Mild pulmonary venous hypertension without overt edema. IMPRESSION: Large loculated right pleural effusion with extension into the major fissure, increased in size since September, 2016. Stable cardiomegaly without overt edema. Electronically Signed   By: Evangeline Dakin M.D.   On: 09/22/2015 16:01   Ct Head Wo Contrast  09/26/2015  CLINICAL DATA:  Acute encephalopathy EXAM: CT HEAD WITHOUT CONTRAST TECHNIQUE: Contiguous axial images were obtained from the base of the skull through the vertex without intravenous contrast. COMPARISON:  02/25/2012 FINDINGS: Diffuse cortical atrophy particularly in the infratentorial compartment. This is stable. No hydrocephalus. No evidence of mass or vascular territory infarct. No hemorrhage or extra-axial fluid. Prominent perivascular spaces versus tiny lacunar infarcts left  temporal lobe, stable. A Numerous opacified  right mastoid air cells. Left mastoid air cells primarily clear. Small mucous retention cyst right maxillary sinus. Visualized portions of the paranasal sinuses otherwise clear. Prominent venous channels throughout the calvarium stable. Calvarium otherwise intact. Extensive intracranial carotid artery calcification. IMPRESSION: Age-related involutional change. Partial opacification right mastoid air cells. The presence of fluid within the right mastoid air cells is new from 2013. Electronically Signed   By: Skipper Cliche M.D.   On: 09/26/2015 18:54   Ct Angio Chest Pe W/cm &/or Wo Cm  09/22/2015  CLINICAL DATA:  82 year old female with shortness of breath and right-sided pleural effusion EXAM: CT ANGIOGRAPHY CHEST WITH CONTRAST TECHNIQUE: Multidetector CT imaging of the chest was performed using the standard protocol during bolus administration of intravenous contrast. Multiplanar CT image reconstructions and MIPs were obtained to evaluate the vascular anatomy. CONTRAST:  85 mL Isovue 370 COMPARISON:  Chest x-ray obtained concurrently; most recent chest CT 01/27/2015 FINDINGS: Mediastinum: Unremarkable CT appearance of the thyroid gland. No suspicious mediastinal or hilar adenopathy. No soft tissue mediastinal mass. The thoracic esophagus is unremarkable. Heart/Vascular: Adequate opacification of the pulmonary arteries to the proximal subsegmental level. No evidence of acute pulmonary embolus. Conventional 3 vessel arch anatomy. No aneurysm. Cardiomegaly. Right heart enlargement. No pericardial effusion. Atherosclerotic calcifications along the coronary arteries. Lungs/Pleura: Small right and trace left layering pleural effusions. Mild interlobular septal thickening and diffuse mild ground-glass attenuation airspace opacity most consistent with mild interstitial edema. Passive atelectasis in the lower lobes. Mild bronchial wall thickening diffusely. Bones/Soft  Tissues: No acute fracture or aggressive appearing lytic or blastic osseous lesion. Upper Abdomen: Stable gastric antral lipoma. Visualized upper abdominal organs are unremarkable. Review of the MIP images confirms the above findings. IMPRESSION: 1. Negative for acute pulmonary embolus. 2. Mild pulmonary edema. In the setting of cardiomegaly this is concerning for mild congestive heart failure. 3. Small right and trace left layering pleural effusions with associated bibasilar atelectasis. 4. Atherosclerosis including coronary artery calcifications. Electronically Signed   By: Jacqulynn Cadet M.D.   On: 09/22/2015 19:26    Microbiology: No results found for this or any previous visit (from the past 240 hour(s)).   Labs: CBC:  Recent Labs Lab 09/25/15 0440 09/26/15 0651 09/27/15 0318  WBC 4.5 4.0 3.6*  NEUTROABS  --  2.5 2.1  HGB 11.3* 11.5* 11.5*  HCT 38.0 37.1 39.3  MCV 96.9 95.1 98.3  PLT 120* 118* [id number]*   Basic Metabolic Panel:  Recent Labs Lab 09/26/15 0651 09/27/15 0318 09/28/15 0535 09/29/15 0459 09/30/15 0341  NA 138 138 137 137 140  K 4.0 4.1 3.8 3.7 3.6  CL 93* 91* 89* 92* 95*  CO2 38* 40* 38* 37* 38*  GLUCOSE 112* 215* 179* 248* 195*  BUN 19 24* 29* 38* 38*  CREATININE 0.90 1.13* 1.12* 1.31* 1.28*  CALCIUM 8.5* 8.8* 9.3 9.2 9.1  MG 2.0 2.2 2.2 2.1 2.2  PHOS  --   --  3.1  --   --    Liver Function Tests:  Recent Labs Lab 09/28/15 0535  ALBUMIN 2.9*   No results for input(s): LIPASE, AMYLASE in the last 168 hours.  Recent Labs Lab 09/26/15 1638  AMMONIA 36*   Cardiac Enzymes: No results for input(s): CKTOTAL, CKMB, CKMBINDEX, TROPONINI in the last 168 hours. BNP (last 3 results)  Recent Labs  08/29/15 1147 09/22/15 1513 09/25/15 0440  BNP 102.7* 173.0* 131.7*   CBG:  Recent Labs Lab 09/30/15 0655 09/30/15 1132 09/30/15 1613 09/30/15 2159 10/01/15  [place]   Time spent: 30 minutes  Signed:  Znya Albino,  Tanique Matney  [facility]  10/01/2015  , 11:21 AM

## 2015-10-01 NOTE — Progress Notes (Signed)
Inpatient Diabetes Program Recommendations  AACE/ADA: New Consensus Statement on Inpatient Glycemic Control (2015)  Target Ranges:  Prepandial:   less than 140 mg/dL      Peak postprandial:   less than 180 mg/dL (1-2 hours)      Critically ill patients:  140 - 180 mg/dL  Results for Elizabeth Pugh, Elizabeth Pugh (MRN [id number]) as of 10/01/2015 10:19  Ref. Range 09/30/2015 06:55 09/30/2015 11:32 09/30/2015 16:13 09/30/2015 21:59 10/01/2015 05:55  Glucose-Capillary Latest Ref Range: 65-99 mg/dL 169 (H) 227 (H) 196 (H) 221 (H) 274 (H)   Review of Glycemic Control  Diabetes history: DM2 Outpatient Diabetes medications: Amaryl 2 mg QAM, Novolog 0-12 units TID with meals, Lantus 20 units QHS Current orders for Inpatient glycemic control: Lantus 18 units QHS, Novolog 0-15 units TID with meals  Inpatient Diabetes Program Recommendations: Insulin - Basal: Fasting glucose 274 mg/dl today. Please consider increasing Lantus to 20 units QHS. Insulin - Meal Coverage: Please consider ordering Novolog 3 units TID with meals for meal coverage.  Thanks, Barnie Alderman, RN, MSN, CDE Diabetes Coordinator Inpatient Diabetes Program [phone] (Team Pager from [place] to [place]) [phone] ([place] office) [phone] [facility] office) [phone] [facility] office)

## 2015-10-06 ENCOUNTER — Encounter (HOSPITAL_COMMUNITY): Payer: Self-pay

## 2015-10-06 ENCOUNTER — Emergency Department (HOSPITAL_COMMUNITY)
Admission: EM | Admit: 2015-10-06 | Discharge: 2015-10-06 | Disposition: A | Discharge status: Home / Self Care | Payer: Medicare Other | Attending: Emergency Medicine | Admitting: Emergency Medicine

## 2015-10-06 DIAGNOSIS — M1990 Unspecified osteoarthritis, unspecified site: Secondary | ICD-10-CM | POA: Diagnosis not present

## 2015-10-06 DIAGNOSIS — Z8673 Personal history of transient ischemic attack (TIA), and cerebral infarction without residual deficits: Secondary | ICD-10-CM | POA: Insufficient documentation

## 2015-10-06 DIAGNOSIS — Z794 Long term (current) use of insulin: Secondary | ICD-10-CM | POA: Diagnosis not present

## 2015-10-06 DIAGNOSIS — R531 Weakness: Secondary | ICD-10-CM | POA: Diagnosis present

## 2015-10-06 DIAGNOSIS — Z7984 Long term (current) use of oral hypoglycemic drugs: Secondary | ICD-10-CM | POA: Insufficient documentation

## 2015-10-06 DIAGNOSIS — E119 Type 2 diabetes mellitus without complications: Secondary | ICD-10-CM | POA: Insufficient documentation

## 2015-10-06 DIAGNOSIS — R600 Localized edema: Secondary | ICD-10-CM | POA: Insufficient documentation

## 2015-10-06 DIAGNOSIS — Z87891 Personal history of nicotine dependence: Secondary | ICD-10-CM | POA: Diagnosis not present

## 2015-10-06 DIAGNOSIS — I110 Hypertensive heart disease with heart failure: Secondary | ICD-10-CM | POA: Diagnosis not present

## 2015-10-06 DIAGNOSIS — Z7901 Long term (current) use of anticoagulants: Secondary | ICD-10-CM | POA: Insufficient documentation

## 2015-10-06 DIAGNOSIS — I509 Heart failure, unspecified: Secondary | ICD-10-CM | POA: Insufficient documentation

## 2015-10-06 LAB — BASIC METABOLIC PANEL
ANION GAP: 5 (ref 5–15)
BUN: 24 mg/dL — ABNORMAL HIGH (ref 6–20)
CO2: 30 mmol/L (ref 22–32)
Calcium: 8.6 mg/dL — ABNORMAL LOW (ref 8.9–10.3)
Chloride: 102 mmol/L (ref 101–111)
Creatinine, Ser: 0.87 mg/dL (ref 0.44–1.00)
GFR calc Af Amer: >60 mL/min (ref >60)
GFR calc non Af Amer: 58 mL/min — ABNORMAL LOW (ref >60)
GLUCOSE: 258 mg/dL — AB (ref 65–99)
POTASSIUM: 4.7 mmol/L (ref 3.5–5.1)
Sodium: 137 mmol/L (ref 135–145)

## 2015-10-06 LAB — CBC
HEMATOCRIT: 37.7 % (ref 36.0–46.0)
HEMOGLOBIN: 11.9 g/dL — AB (ref 12.0–15.0)
MCH: 29.4 pg (ref 26.0–34.0)
MCHC: 31.6 g/dL (ref 30.0–36.0)
MCV: 93.1 fL (ref 78.0–100.0)
Platelets: 149 10*3/uL — ABNORMAL LOW (ref 150–400)
RBC: 4.05 MIL/uL (ref 3.87–5.11)
RDW: 14.2 % (ref 11.5–15.5)
WBC: 2.6 10*3/uL — ABNORMAL LOW (ref 4.0–10.5)

## 2015-10-06 LAB — CBG MONITORING, ED
Glucose-Capillary: 257 mg/dL — ABNORMAL HIGH (ref 65–99)
Glucose-Capillary: 205 mg/dL — ABNORMAL HIGH (ref 65–99)

## 2015-10-06 MED ORDER — FUROSEMIDE 10 MG/ML IJ SOLN
60.0000 mg | Freq: Once | INTRAMUSCULAR | Status: AC
  Administered 2015-10-06: 60 mg via INTRAVENOUS
  Filled 2015-10-06: qty 8

## 2015-10-06 NOTE — Discharge Instructions (Signed)
It was our pleasure to provide your ER care today - we hope that you feel better.  Elevate legs to help with swelling. Limit salt intake.  Continue diuretic medication/lasix.  Follow up with your doctor in the next couple days for recheck.  Return to ER if worse, new symptoms, fevers, spreading redness, severe leg pain, trouble breathing, other concern.    Edema Edema is an abnormal buildup of fluids in your bodytissues. Edema is somewhatdependent on gravity to pull the fluid to the lowest place in your body. That makes the condition more common in the legs and thighs (lower extremities). Painless swelling of the feet and ankles is common and becomes more likely as you get older. It is also common in looser tissues, like around your eyes.  When the affected area is squeezed, the fluid may move out of that spot and leave a dent for a few moments. This dent is called pitting.  CAUSES  There are many possible causes of edema. Eating too much salt and being on your feet or sitting for a long time can cause edema in your legs and ankles. Hot weather may make edema worse. Common medical causes of edema include:  Heart failure.  Liver disease.  Kidney disease.  Weak blood vessels in your legs.  Cancer.  An injury.  Pregnancy.  Some medications.  Obesity. SYMPTOMS  Edema is usually painless.Your skin may look swollen or shiny.  DIAGNOSIS  Your health care provider may be able to diagnose edema by asking about your medical history and doing a physical exam. You may need to have tests such as X-rays, an electrocardiogram, or blood tests to check for medical conditions that may cause edema.  TREATMENT  Edema treatment depends on the cause. If you have heart, liver, or kidney disease, you need the treatment appropriate for these conditions. General treatment may include:  Elevation of the affected body part above the level of your heart.  Compression of the affected body part.  Pressure from elastic bandages or support stockings squeezes the tissues and forces fluid back into the blood vessels. This keeps fluid from entering the tissues.  Restriction of fluid and salt intake.  Use of a water pill (diuretic). These medications are appropriate only for some types of edema. They pull fluid out of your body and make you urinate more often. This gets rid of fluid and reduces swelling, but diuretics can have side effects. Only use diuretics as directed by your health care provider. HOME CARE INSTRUCTIONS   Keep the affected body part above the level of your heart when you are lying down.   Do not sit still or stand for prolonged periods.   Do not put anything directly under your knees when lying down.  Do not wear constricting clothing or garters on your upper legs.   Exercise your legs to work the fluid back into your blood vessels. This may help the swelling go down.   Wear elastic bandages or support stockings to reduce ankle swelling as directed by your health care provider.   Eat a low-salt diet to reduce fluid if your health care provider recommends it.   Only take medicines as directed by your health care provider. SEEK MEDICAL CARE IF:   Your edema is not responding to treatment.  You have heart, liver, or kidney disease and notice symptoms of edema.  You have edema in your legs that does not improve after elevating them.   You have sudden and  unexplained weight gain. SEEK IMMEDIATE MEDICAL CARE IF:   You develop shortness of breath or chest pain.   You cannot breathe when you lie down.  You develop pain, redness, or warmth in the swollen areas.   You have heart, liver, or kidney disease and suddenly get edema.  You have a fever and your symptoms suddenly get worse. MAKE SURE YOU:   Understand these instructions.  Will watch your condition.  Will get help right away if you are not doing well or get worse.   This information is  not intended to replace advice given to you by your health care provider. Make sure you discuss any questions you have with your health care provider.   Document Released: 04/20/2005 Document Revised: 05/11/2014 Document Reviewed: 02/10/2013 Elsevier Interactive Patient Education [employer].

## 2015-10-06 NOTE — ED Notes (Signed)
[facility] notified, [facility] staff notified of pts d/c.

## 2015-10-06 NOTE — ED Notes (Signed)
Bilateral lower extremities elevated.

## 2015-10-06 NOTE — ED Provider Notes (Addendum)
CSN: [id number]     Arrival date & time 10/06/15  1110 History   First MD Initiated Contact with Patient 10/06/15 1152     Chief Complaint  Patient presents with  . Leg Swelling  . Weakness     (Consider location/radiation/quality/duration/timing/severity/associated sxs/prior Treatment) Patient is a 82 y.o. female presenting with weakness. The history is provided by the patient.  Weakness Pertinent negatives include no chest pain, no abdominal pain, no headaches and no shortness of breath.  Patient sent from [facility], has chronic leg edema, and states 'weeping' fluid from left lower leg.  Patient denies pain to area/legs. Denies any asymmetric swelling.  Has chronic bilateral leg edema.  No erythema or cellulitis.  No fever or chills. Patient states otherwise feels well, is hungry/thirsty.  States urinating normal amount. Denies sob or pnd. No chest pain or discomfort.      Past Medical History  Diagnosis Date  . Stroke ([place])   . GERD (gastroesophageal reflux disease)   . Hypertension   . Diabetes mellitus   . Blood transfusion   . Arthritis   . Anxiety   . Anemia   . GI (gastrointestinal bleed)     Due to benign mass  . Bronchitis   . Restless leg syndrome   . Atrial fibrillation ([place])   . Acute delirium 04/12/2011  . Seizures ([place])   . CHF (congestive heart failure) ([place])   . Acute kidney injury ([place]) 09/02/2015  . Lobar pneumonia due to unspecified organism 09/02/2015   Past Surgical History  Procedure Laterality Date  . Eye surgery      Cataract removal  . Abdominal hysterectomy    . Tubal ligation    . Eus  09/24/2011    Procedure: UPPER ENDOSCOPIC ULTRASOUND (EUS) LINEAR;  Surgeon: Beryle Beams, MD;  Location: [facility] ENDOS[facility];  Service: Endoscopy;  Laterality: N/A;   Family History  Problem Relation Age of Onset  . Aneurysm Mother   . Heart attack Father   . Heart failure Sister   . Asthma Sister   . Kidney failure Sister   . Stroke Sister    Social History   Substance Use Topics  . Smoking status: Former Smoker    Types: Cigarettes    Quit date: 09/21/1985  . Smokeless tobacco: Never Used  . Alcohol Use: No   OB History    No data available     Review of Systems  Constitutional: Negative for fever and chills.  HENT: Negative for sore throat.   Eyes: Negative for redness.  Respiratory: Negative for shortness of breath.   Cardiovascular: Positive for leg swelling. Negative for chest pain.  Gastrointestinal: Negative for nausea, vomiting and abdominal pain.  Genitourinary: Negative for flank pain.  Musculoskeletal: Negative for back pain and neck pain.  Skin: Negative for rash.  Neurological: Negative for headaches.  Hematological: Does not bruise/bleed easily.  Psychiatric/Behavioral: Negative for confusion.      Allergies  Erythromycin; Penicillins; and Zithromax  Home Medications   Prior to Admission medications   Medication Sig Start Date End Date Taking? Authorizing Provider  albuterol (PROVENTIL HFA;VENTOLIN HFA) 108 (90 BASE) MCG/ACT inhaler Inhale 1 puff into the lungs every 6 (six) hours as needed for wheezing or shortness of breath.   Yes Historical Provider, MD  ALPRAZolam (XANAX) 0.25 MG tablet Take 1 tablet (0.25 mg total) by mouth 2 (two) times daily as needed for anxiety. 01/28/15  Yes Geradine Girt, DO  apixaban (ELIQUIS) 5 MG TABS  tablet Take 1 tablet (5 mg total) by mouth 2 (two) times daily. 09/27/15  Yes Lavina Hamman, MD  calcium-vitamin D (CALCIUM 500+D) 500-400 MG-UNIT per tablet Take 1 tablet by mouth 2 (two) times daily.   Yes Historical Provider, MD  carvedilol (COREG) 3.125 MG tablet Take 1 tablet (3.125 mg total) by mouth 2 (two) times daily with a meal. 10/01/15  Yes Lavina Hamman, MD  ferrous sulfate 325 (65 FE) MG tablet Take 325 mg by mouth daily with breakfast.   Yes Historical Provider, MD  furosemide (LASIX) 20 MG tablet Take 3 tablets (60 mg total) by mouth daily. 10/01/15  Yes Lavina Hamman, MD   glimepiride (AMARYL) 2 MG tablet Take 2 mg by mouth daily before breakfast.   Yes Historical Provider, MD  guaiFENesin (MUCINEX) 600 MG 12 hr tablet Take 2 tablets (1,200 mg total) by mouth 2 (two) times daily. 09/02/15  Yes Nishant Dhungel, MD  insulin aspart (NOVOLOG) 100 UNIT/ML injection Inject 0-12 Units into the skin 3 (three) times daily before meals. Under 150 = 0 units, 150 - 200 = 2 units, 201- 250 = 4 units, 251- 300 = 6 units, 301- 350 = 8 units, 351-400 = 10 units, 401-450 = 12  units   Yes Historical Provider, MD  Insulin Detemir (LEVEMIR FLEXPEN) 100 UNIT/ML Pen Inject 15 Units into the skin daily at 10 pm.   Yes Historical Provider, MD  ipratropium-albuterol (DUONEB) 0.5-2.5 (3) MG/3ML SOLN Take 3 mLs by nebulization every 6 (six) hours as needed. Patient taking differently: Take 3 mLs by nebulization every 6 (six) hours as needed (FOR SOB).  01/25/15  Yes [name], MD  levETIRAcetam (KEPPRA) 500 MG tablet Take 1 tablet (500 mg total) by mouth 2 (two) times daily. 02/27/12  Yes Monika Salk, MD  pantoprazole (PROTONIX) 40 MG tablet Take 1 tablet (40 mg total) by mouth 2 (two) times daily before a meal. 10/01/15  Yes Lavina Hamman, MD  polyethylene glycol (MIRALAX / GLYCOLAX) packet Take 17 g by mouth daily. 09/27/15  Yes Lavina Hamman, MD  potassium chloride SA (K-DUR,KLOR-CON) 20 MEQ tablet Take 1 tablet (20 mEq total) by mouth daily. 09/27/15  Yes Lavina Hamman, MD  pregabalin (LYRICA) 75 MG capsule Take 75 mg by mouth 2 (two) times daily.   Yes Historical Provider, MD  rOPINIRole (REQUIP) 2 MG tablet Take 1 tablet (2 mg total) by mouth 3 (three) times daily. 09/27/15  Yes Lavina Hamman, MD  VOLTAREN 1 % GEL Apply 1 application topically 4 (four) times daily as needed (knee pain). For pain 10/26/11  Yes Historical Provider, MD  insulin glargine (LANTUS) 100 UNIT/ML injection Inject 0.15 mLs (15 Units total) into the skin at bedtime. Patient not taking: Reported on 10/06/2015 09/27/15    Lavina Hamman, MD   BP 142/84 mmHg  Pulse 80  Temp(Src) 98 F (36.7 C) (Oral)  Resp 21  SpO2 99% Physical Exam  Constitutional: She appears well-developed and well-nourished. No distress.  HENT:  Mouth/Throat: Oropharynx is clear and moist.  Eyes: Conjunctivae are normal. No scleral icterus.  Neck: Neck supple. No tracheal deviation present.  Cardiovascular: Normal rate, regular rhythm, normal heart sounds and intact distal pulses.   Pulmonary/Chest: Effort normal and breath sounds normal. No respiratory distress.  Abdominal: Soft. Normal appearance and bowel sounds are normal. She exhibits no distension. There is no tenderness.  obese  Musculoskeletal: She exhibits edema.  Chronic bilateral lower  leg and foot edema, symmetric.  Minimal fluid weeping from left leg.  No cellulitis or other sign of infection.   No pain w rom knee and ankle.   Neurological: She is alert.  Speech clear/fluent.     Skin: Skin is warm and dry. No rash noted. She is not diaphoretic.  Psychiatric: She has a normal mood and affect.  Nursing note and vitals reviewed.   ED Course  Procedures (including critical care time) Labs Review  Results for orders placed or performed during the hospital encounter of [id number]  Basic metabolic panel  Result Value Ref Range   Sodium 137 135 - 145 mmol/L   Potassium 4.7 3.5 - 5.1 mmol/L   Chloride 102 101 - 111 mmol/L   CO2 30 22 - 32 mmol/L   Glucose, Bld 258 (H) 65 - 99 mg/dL   BUN 24 (H) 6 - 20 mg/dL   Creatinine, Ser 0.87 0.44 - 1.00 mg/dL   Calcium 8.6 (L) 8.9 - 10.3 mg/dL   GFR calc non Af Amer 58 (L) >60 mL/min   GFR calc Af Amer >60 >60 mL/min   Anion gap 5 5 - 15  CBC  Result Value Ref Range   WBC 2.6 (L) 4.0 - 10.5 K/uL   RBC 4.05 3.87 - 5.11 MIL/uL   Hemoglobin 11.9 (L) 12.0 - 15.0 g/dL   HCT 37.7 36.0 - 46.0 %   MCV 93.1 78.0 - 100.0 fL   MCH 29.4 26.0 - 34.0 pg   MCHC 31.6 30.0 - 36.0 g/dL   RDW 14.2 11.5 - 15.5 %   Platelets 149 (L) 150  - 400 K/uL  CBG monitoring, ED  Result Value Ref Range   Glucose-Capillary 257 (H) 65 - 99 mg/dL     I have personally reviewed and evaluated these lab results as part of my medical decision-making.    MDM   Labs sent.  Will give patients po dose lasix iv.   Reviewed nursing notes and prior charts for additional history.   Patient states she does not feel ill or sick, and that only issue was leg was weeping.  No sign acute infection on exam. Today's labs c/w baseline, renal fxn improved.  On review prior labs, albumin low, which likely also contributes to leg edema. No dyspnea, chest cta.   Po fluids/happy meal per pt request.   Elevate legs.   Rec close pcp f/u.         Lajean Saver, MD 10/06/15 [phone]

## 2015-10-06 NOTE — ED Notes (Signed)
She is sent by [facility] with c/o redness and weeping of post. Left lower leg.  She arrives alert and oriented x 4 and in no distress.  She has no c/o pain.

## 2015-10-06 NOTE — ED Notes (Addendum)
According to EMS, pt c/o left knee swelling + edema + weeping fluid . Pt denies left knee pain. Pt A+OX4, speaking in complete sentences, coming from [facility].

## 2015-10-11 ENCOUNTER — Inpatient Hospital Stay: Payer: No Typology Code available for payment source | Admitting: Pulmonary Disease

## 2015-10-14 ENCOUNTER — Ambulatory Visit (INDEPENDENT_AMBULATORY_CARE_PROVIDER_SITE_OTHER): Payer: Medicare Other | Admitting: Adult Health

## 2015-10-14 ENCOUNTER — Encounter: Payer: Self-pay | Admitting: Adult Health

## 2015-10-14 VITALS — BP 102/68 | HR 71 | Temp 97.9°F | Ht 66.0 in | Wt 281.0 lb

## 2015-10-14 DIAGNOSIS — J9611 Chronic respiratory failure with hypoxia: Secondary | ICD-10-CM

## 2015-10-14 DIAGNOSIS — I5032 Chronic diastolic (congestive) heart failure: Secondary | ICD-10-CM | POA: Diagnosis not present

## 2015-10-14 DIAGNOSIS — J9612 Chronic respiratory failure with hypercapnia: Secondary | ICD-10-CM | POA: Diagnosis not present

## 2015-10-14 NOTE — Patient Instructions (Addendum)
Restart BIPAP At bedtime  And with naps.  Set up for Sleep study -Split night .  Continue on Oxygen 2l/m .  follow up Dr. Murlean Iba in 2 months as planned and As needed   Please contact office for sooner follow up if symptoms do not improve or worsen or seek emergency care

## 2015-10-18 NOTE — Progress Notes (Signed)
Subjective:    Patient ID: Elizabeth Pugh, female    DOB: February 15, 1927, 82 y.o.   MRN: [id number]  HPI 82 yo female seen for pulmonary consult for decompensated Diastolic CHF , Right partially loculated pleural effusion., OHS /OSA    10/18/2015 [facility] Follow up Pt is here for a hospital follow up. Recently admitted for decompensated D CHF . Felt to have OHS/OSA and started on BIPAP . She was aggressively diuresed . Admission weight was 355lbs, discharge wt was 277lb.  Pt states breathing has improved since discharged and being placed on oxygen. Feels the Oxygen has really helped.Echo in 12/2014 with EF 55-60%.  Pt c/o dry cough and SOB at times. Denies any chest congestion/tightness, sinus pressure/drainage, fever, nausea or vomiting.  She has not been wearing her BIPAP , we discussed BIPAP compliance . She denies chest pain, orthopnea, increased edema or fever.   Past Medical History  Diagnosis Date  . Stroke ([place])   . GERD (gastroesophageal reflux disease)   . Hypertension   . Diabetes mellitus   . Blood transfusion   . Arthritis   . Anxiety   . Anemia   . GI (gastrointestinal bleed)     Due to benign mass  . Bronchitis   . Restless leg syndrome   . Atrial fibrillation ([place])   . Acute delirium 04/12/2011  . Seizures ([place])   . CHF (congestive heart failure) ([place])   . Acute kidney injury ([place]) 09/02/2015  . Lobar pneumonia due to unspecified organism 09/02/2015   Current Outpatient Prescriptions on File Prior to Visit  Medication Sig Dispense Refill  . albuterol (PROVENTIL HFA;VENTOLIN HFA) 108 (90 BASE) MCG/ACT inhaler Inhale 1 puff into the lungs every 6 (six) hours as needed for wheezing or shortness of breath.    Marland Kitchen apixaban (ELIQUIS) 5 MG TABS tablet Take 1 tablet (5 mg total) by mouth 2 (two) times daily. 60 tablet 0  . calcium-vitamin D (CALCIUM 500+D) 500-400 MG-UNIT per tablet Take 1 tablet by mouth 2 (two) times daily.    . carvedilol (COREG) 3.125 MG tablet Take 1  tablet (3.125 mg total) by mouth 2 (two) times daily with a meal. 60 tablet 0  . ferrous sulfate 325 (65 FE) MG tablet Take 325 mg by mouth daily with breakfast.    . furosemide (LASIX) 20 MG tablet Take 3 tablets (60 mg total) by mouth daily. 30 tablet 0  . glimepiride (AMARYL) 2 MG tablet Take 2 mg by mouth daily before breakfast.    . guaiFENesin (MUCINEX) 600 MG 12 hr tablet Take 2 tablets (1,200 mg total) by mouth 2 (two) times daily. 10 tablet 0  . insulin aspart (NOVOLOG) 100 UNIT/ML injection Inject 0-12 Units into the skin 3 (three) times daily before meals. Under 150 = 0 units, 150 - 200 = 2 units, 201- 250 = 4 units, 251- 300 = 6 units, 301- 350 = 8 units, 351-400 = 10 units, 401-450 = 12  units    . Insulin Detemir (LEVEMIR FLEXPEN) 100 UNIT/ML Pen Inject 15 Units into the skin daily at 10 pm.    . insulin glargine (LANTUS) 100 UNIT/ML injection Inject 0.15 mLs (15 Units total) into the skin at bedtime. 10 mL 0  . ipratropium-albuterol (DUONEB) 0.5-2.5 (3) MG/3ML SOLN Take 3 mLs by nebulization every 6 (six) hours as needed. (Patient taking differently: Take 3 mLs by nebulization every 6 (six) hours as needed (FOR SOB). ) 360 mL 0  . levETIRAcetam (  KEPPRA) 500 MG tablet Take 1 tablet (500 mg total) by mouth 2 (two) times daily. 30 tablet 2  . pantoprazole (PROTONIX) 40 MG tablet Take 1 tablet (40 mg total) by mouth 2 (two) times daily before a meal. 60 tablet 0  . polyethylene glycol (MIRALAX / GLYCOLAX) packet Take 17 g by mouth daily. 14 each 0  . potassium chloride SA (K-DUR,KLOR-CON) 20 MEQ tablet Take 1 tablet (20 mEq total) by mouth daily. 10 tablet 0  . pregabalin (LYRICA) 75 MG capsule Take 75 mg by mouth 2 (two) times daily.    Marland Kitchen rOPINIRole (REQUIP) 2 MG tablet Take 1 tablet (2 mg total) by mouth 3 (three) times daily. 30 tablet 0  . VOLTAREN 1 % GEL Apply 1 application topically 4 (four) times daily as needed (knee pain). For pain    . ALPRAZolam (XANAX) 0.25 MG tablet Take 1  tablet (0.25 mg total) by mouth 2 (two) times daily as needed for anxiety. (Patient not taking: Reported on 10/14/2015) 30 tablet 0   No current facility-administered medications on file prior to visit.      Review of Systems Constitutional:   No  weight loss, night sweats,  Fevers, chills,  +fatigue, or  lassitude.  HEENT:   No headaches,  Difficulty swallowing,  Tooth/dental problems, or  Sore throat,                No sneezing, itching, ear ache, nasal congestion, post nasal drip,   CV:  No chest pain,  Orthopnea, PND,anasarca, dizziness, palpitations, syncope.   GI  No heartburn, indigestion, abdominal pain, nausea, vomiting, diarrhea, change in bowel habits, loss of appetite, bloody stools.   Resp:   No chest wall deformity  Skin: no rash or lesions.  GU: no dysuria, change in color of urine, no urgency or frequency.  No flank pain, no hematuria   MS:  No joint pain or swelling.  No decreased range of motion.  No back pain.  Psych:  No change in mood or affect. No depression or anxiety.  No memory loss.         Objective:   Physical Exam   Filed Vitals:   10/14/15 1052  BP: 102/68  Pulse: 71  Temp: 97.9 F (36.6 C)  TempSrc: Oral  Height: 5\' 6"  (1.676 m)  Weight: 281 lb (127.461 kg)  SpO2: 99%    GEN: A/Ox3; pleasant , NAD, obese , elderly   HEENT:  [place]/AT,  EACs-clear, TMs-wnl, NOSE-clear, THROAT-clear, no lesions, no postnasal drip or exudate noted.   NECK:  Supple w/ fair ROM; no JVD; normal carotid impulses w/o bruits; no thyromegaly or nodules palpated; no lymphadenopathy.  RESP  Decreased BS in bases , no accessory muscle use, no dullness to percussion  CARD:  RRR, no m/r/g  , 1+  peripheral edema, pulses intact, no cyanosis or clubbing.  GI:   Soft & nt; nml bowel sounds; no organomegaly or masses detected.  Musco: Warm bil, no deformities or joint swelling noted.   Neuro: alert, no focal deficits noted.    Skin: Warm, no lesions or  rashes     Tammy Parrett NP-C  [facility]

## 2015-11-02 NOTE — Assessment & Plan Note (Signed)
Restart BIPAP At bedtime  And with naps.  Set up for Sleep study -Split night .  Continue on Oxygen 2l/m .  follow up Dr. Murlean Iba in 2 months as planned and As needed   Please contact office for sooner follow up if symptoms do not improve or worsen or seek emergency care

## 2015-11-02 NOTE — Assessment & Plan Note (Signed)
Recent decompensation -improved with diuresis   Plan  Restart BIPAP At bedtime  And with naps.  Set up for Sleep study -Split night .  Continue on Oxygen 2l/m .  follow up Dr. Murlean Iba in 2 months as planned and As needed   Please contact office for sooner follow up if symptoms do not improve or worsen or seek emergency care

## 2015-11-26 ENCOUNTER — Encounter (HOSPITAL_BASED_OUTPATIENT_CLINIC_OR_DEPARTMENT_OTHER): Payer: No Typology Code available for payment source

## 2015-12-11 ENCOUNTER — Inpatient Hospital Stay (HOSPITAL_COMMUNITY)
Admission: EM | Admit: 2015-12-11 | Discharge: 2015-12-17 | DRG: 689 | Disposition: A | Discharge status: Home / Self Care | Payer: Medicare Other | Attending: Family Medicine | Admitting: Family Medicine

## 2015-12-11 ENCOUNTER — Encounter (HOSPITAL_COMMUNITY): Payer: Self-pay | Admitting: *Deleted

## 2015-12-11 ENCOUNTER — Emergency Department (HOSPITAL_COMMUNITY): Payer: Medicare Other

## 2015-12-11 DIAGNOSIS — E1165 Type 2 diabetes mellitus with hyperglycemia: Secondary | ICD-10-CM | POA: Diagnosis present

## 2015-12-11 DIAGNOSIS — I110 Hypertensive heart disease with heart failure: Secondary | ICD-10-CM | POA: Diagnosis present

## 2015-12-11 DIAGNOSIS — Z8673 Personal history of transient ischemic attack (TIA), and cerebral infarction without residual deficits: Secondary | ICD-10-CM

## 2015-12-11 DIAGNOSIS — J9612 Chronic respiratory failure with hypercapnia: Secondary | ICD-10-CM | POA: Diagnosis present

## 2015-12-11 DIAGNOSIS — Z6841 Body Mass Index (BMI) 40.0 and over, adult: Secondary | ICD-10-CM

## 2015-12-11 DIAGNOSIS — R6510 Systemic inflammatory response syndrome (SIRS) of non-infectious origin without acute organ dysfunction: Secondary | ICD-10-CM | POA: Diagnosis present

## 2015-12-11 DIAGNOSIS — R410 Disorientation, unspecified: Secondary | ICD-10-CM | POA: Diagnosis not present

## 2015-12-11 DIAGNOSIS — G40909 Epilepsy, unspecified, not intractable, without status epilepticus: Secondary | ICD-10-CM

## 2015-12-11 DIAGNOSIS — Z79899 Other long term (current) drug therapy: Secondary | ICD-10-CM

## 2015-12-11 DIAGNOSIS — R509 Fever, unspecified: Secondary | ICD-10-CM

## 2015-12-11 DIAGNOSIS — R112 Nausea with vomiting, unspecified: Secondary | ICD-10-CM

## 2015-12-11 DIAGNOSIS — I5042 Chronic combined systolic (congestive) and diastolic (congestive) heart failure: Secondary | ICD-10-CM | POA: Diagnosis present

## 2015-12-11 DIAGNOSIS — Z880 Allergy status to penicillin: Secondary | ICD-10-CM

## 2015-12-11 DIAGNOSIS — Z7401 Bed confinement status: Secondary | ICD-10-CM

## 2015-12-11 DIAGNOSIS — Z794 Long term (current) use of insulin: Secondary | ICD-10-CM

## 2015-12-11 DIAGNOSIS — Z66 Do not resuscitate: Secondary | ICD-10-CM | POA: Diagnosis present

## 2015-12-11 DIAGNOSIS — F419 Anxiety disorder, unspecified: Secondary | ICD-10-CM | POA: Diagnosis present

## 2015-12-11 DIAGNOSIS — E118 Type 2 diabetes mellitus with unspecified complications: Secondary | ICD-10-CM

## 2015-12-11 DIAGNOSIS — N390 Urinary tract infection, site not specified: Secondary | ICD-10-CM | POA: Diagnosis not present

## 2015-12-11 DIAGNOSIS — I482 Chronic atrial fibrillation: Secondary | ICD-10-CM | POA: Diagnosis present

## 2015-12-11 DIAGNOSIS — I10 Essential (primary) hypertension: Secondary | ICD-10-CM | POA: Diagnosis present

## 2015-12-11 DIAGNOSIS — G9340 Encephalopathy, unspecified: Secondary | ICD-10-CM | POA: Diagnosis not present

## 2015-12-11 DIAGNOSIS — J9611 Chronic respiratory failure with hypoxia: Secondary | ICD-10-CM | POA: Diagnosis present

## 2015-12-11 DIAGNOSIS — Z8249 Family history of ischemic heart disease and other diseases of the circulatory system: Secondary | ICD-10-CM

## 2015-12-11 DIAGNOSIS — G2581 Restless legs syndrome: Secondary | ICD-10-CM | POA: Diagnosis present

## 2015-12-11 DIAGNOSIS — Z9981 Dependence on supplemental oxygen: Secondary | ICD-10-CM

## 2015-12-11 DIAGNOSIS — IMO0002 Reserved for concepts with insufficient information to code with codable children: Secondary | ICD-10-CM | POA: Diagnosis present

## 2015-12-11 DIAGNOSIS — Z87440 Personal history of urinary (tract) infections: Secondary | ICD-10-CM

## 2015-12-11 DIAGNOSIS — L0390 Cellulitis, unspecified: Secondary | ICD-10-CM

## 2015-12-11 DIAGNOSIS — Z87891 Personal history of nicotine dependence: Secondary | ICD-10-CM

## 2015-12-11 DIAGNOSIS — Z881 Allergy status to other antibiotic agents status: Secondary | ICD-10-CM

## 2015-12-11 DIAGNOSIS — Z823 Family history of stroke: Secondary | ICD-10-CM

## 2015-12-11 DIAGNOSIS — K219 Gastro-esophageal reflux disease without esophagitis: Secondary | ICD-10-CM | POA: Diagnosis present

## 2015-12-11 DIAGNOSIS — I5032 Chronic diastolic (congestive) heart failure: Secondary | ICD-10-CM | POA: Diagnosis present

## 2015-12-11 DIAGNOSIS — Z7901 Long term (current) use of anticoagulants: Secondary | ICD-10-CM

## 2015-12-11 HISTORY — DX: Dependence on supplemental oxygen: Z99.81

## 2015-12-11 LAB — CBC WITH DIFFERENTIAL/PLATELET
BASOS ABS: 0 10*3/uL (ref 0.0–0.1)
Basophils Relative: 0 %
EOS PCT: 0 %
Eosinophils Absolute: 0 10*3/uL (ref 0.0–0.7)
HEMATOCRIT: 40.4 % (ref 36.0–46.0)
HEMOGLOBIN: 12.2 g/dL (ref 12.0–15.0)
LYMPHS ABS: 0.5 10*3/uL — AB (ref 0.7–4.0)
LYMPHS PCT: 6 %
MCH: 29.1 pg (ref 26.0–34.0)
MCHC: 30.2 g/dL (ref 30.0–36.0)
MCV: 96.4 fL (ref 78.0–100.0)
Monocytes Absolute: 0.4 10*3/uL (ref 0.1–1.0)
Monocytes Relative: 4 %
NEUTROS ABS: 8.6 10*3/uL — AB (ref 1.7–7.7)
NEUTROS PCT: 90 %
PLATELETS: 122 10*3/uL — AB (ref 150–400)
RBC: 4.19 MIL/uL (ref 3.87–5.11)
RDW: 13.6 % (ref 11.5–15.5)
WBC: 9.5 10*3/uL (ref 4.0–10.5)

## 2015-12-11 LAB — URINE MICROSCOPIC-ADD ON

## 2015-12-11 LAB — COMPREHENSIVE METABOLIC PANEL
ALBUMIN: 3.4 g/dL — AB (ref 3.5–5.0)
ALK PHOS: 61 U/L (ref 38–126)
ALT: 14 U/L (ref 14–54)
ANION GAP: 8 (ref 5–15)
AST: 22 U/L (ref 15–41)
BUN: 15 mg/dL (ref 6–20)
CHLORIDE: 96 mmol/L — AB (ref 101–111)
CO2: 32 mmol/L (ref 22–32)
Calcium: 9 mg/dL (ref 8.9–10.3)
Creatinine, Ser: 0.92 mg/dL (ref 0.44–1.00)
GFR calc non Af Amer: 54 mL/min — ABNORMAL LOW (ref >60)
GLUCOSE: 194 mg/dL — AB (ref 65–99)
POTASSIUM: 4.6 mmol/L (ref 3.5–5.1)
SODIUM: 136 mmol/L (ref 135–145)
Total Bilirubin: 1.2 mg/dL (ref 0.3–1.2)
Total Protein: 6.7 g/dL (ref 6.5–8.1)

## 2015-12-11 LAB — I-STAT ARTERIAL BLOOD GAS, ED
Acid-Base Excess: 9 mmol/L — ABNORMAL HIGH (ref 0.0–2.0)
Bicarbonate: 36 mEq/L — ABNORMAL HIGH (ref 20.0–24.0)
O2 Saturation: 96 %
PCO2 ART: 57.3 mmHg — AB (ref 35.0–45.0)
PH ART: 7.405 (ref 7.350–7.450)
Patient temperature: 98
TCO2: 38 mmol/L (ref 0–100)
pO2, Arterial: 82 mmHg (ref 80.0–100.0)

## 2015-12-11 LAB — AMMONIA: AMMONIA: 51 umol/L — AB (ref 9–35)

## 2015-12-11 LAB — URINALYSIS, ROUTINE W REFLEX MICROSCOPIC
Bilirubin Urine: NEGATIVE
GLUCOSE, UA: NEGATIVE mg/dL
Ketones, ur: NEGATIVE mg/dL
LEUKOCYTES UA: NEGATIVE
Nitrite: NEGATIVE
PROTEIN: NEGATIVE mg/dL
SPECIFIC GRAVITY, URINE: 1.017 (ref 1.005–1.030)
pH: 5 (ref 5.0–8.0)

## 2015-12-11 LAB — I-STAT TROPONIN, ED: Troponin i, poc: 0.02 ng/mL (ref 0.00–0.08)

## 2015-12-11 LAB — GLUCOSE, CAPILLARY: Glucose-Capillary: 196 mg/dL — ABNORMAL HIGH (ref 65–99)

## 2015-12-11 LAB — I-STAT CG4 LACTIC ACID, ED: Lactic Acid, Venous: 2.13 mmol/L (ref 0.5–1.9)

## 2015-12-11 LAB — PROTIME-INR
INR: 1.27
PROTHROMBIN TIME: 16 s — AB (ref 11.4–15.2)

## 2015-12-11 LAB — BRAIN NATRIURETIC PEPTIDE: B Natriuretic Peptide: 155.3 pg/mL — ABNORMAL HIGH (ref 0.0–100.0)

## 2015-12-11 MED ORDER — SODIUM CHLORIDE 0.9 % IV SOLN
INTRAVENOUS | Status: AC
  Administered 2015-12-11: via INTRAVENOUS

## 2015-12-11 MED ORDER — VANCOMYCIN HCL 10 G IV SOLR
1500.0000 mg | INTRAVENOUS | Status: DC
  Administered 2015-12-12 – 2015-12-15 (×3): 1500 mg via INTRAVENOUS
  Filled 2015-12-11 (×6): qty 1500

## 2015-12-11 MED ORDER — DEXTROSE 5 % IV SOLN
1.0000 g | Freq: Three times a day (TID) | INTRAVENOUS | Status: DC
  Administered 2015-12-11 – 2015-12-12 (×3): 1 g via INTRAVENOUS
  Filled 2015-12-11 (×4): qty 1

## 2015-12-11 MED ORDER — ACETAMINOPHEN 325 MG PO TABS
650.0000 mg | ORAL_TABLET | Freq: Once | ORAL | Status: AC
  Administered 2015-12-11: 650 mg via ORAL
  Filled 2015-12-11: qty 2

## 2015-12-11 MED ORDER — PREGABALIN 75 MG PO CAPS
75.0000 mg | ORAL_CAPSULE | Freq: Two times a day (BID) | ORAL | Status: DC
  Administered 2015-12-11 – 2015-12-17 (×12): 75 mg via ORAL
  Filled 2015-12-11 (×12): qty 1

## 2015-12-11 MED ORDER — DEXTROSE 5 % IV SOLN
2.0000 g | Freq: Once | INTRAVENOUS | Status: AC
  Administered 2015-12-11: 2 g via INTRAVENOUS
  Filled 2015-12-11: qty 2

## 2015-12-11 MED ORDER — INSULIN GLARGINE 100 UNIT/ML ~~LOC~~ SOLN
30.0000 [IU] | Freq: Every day | SUBCUTANEOUS | Status: DC
  Administered 2015-12-11 – 2015-12-16 (×6): 30 [IU] via SUBCUTANEOUS
  Filled 2015-12-11 (×7): qty 0.3

## 2015-12-11 MED ORDER — ALBUTEROL SULFATE (2.5 MG/3ML) 0.083% IN NEBU: 2.5000 mg | INHALATION_SOLUTION | Freq: Four times a day (QID) | RESPIRATORY_TRACT | Status: DC | PRN

## 2015-12-11 MED ORDER — IPRATROPIUM-ALBUTEROL 0.5-2.5 (3) MG/3ML IN SOLN: 3.0000 mL | Freq: Four times a day (QID) | RESPIRATORY_TRACT | Status: DC | PRN

## 2015-12-11 MED ORDER — LACTULOSE 10 GM/15ML PO SOLN
10.0000 g | Freq: Once | ORAL | Status: AC
  Administered 2015-12-11: 10 g via ORAL
  Filled 2015-12-11: qty 15

## 2015-12-11 MED ORDER — ROPINIROLE HCL 1 MG PO TABS
2.0000 mg | ORAL_TABLET | Freq: Three times a day (TID) | ORAL | Status: DC
  Administered 2015-12-11 – 2015-12-17 (×16): 2 mg via ORAL
  Filled 2015-12-11 (×20): qty 2

## 2015-12-11 MED ORDER — POTASSIUM CHLORIDE CRYS ER 20 MEQ PO TBCR
20.0000 meq | EXTENDED_RELEASE_TABLET | Freq: Every day | ORAL | Status: DC
  Administered 2015-12-12 – 2015-12-17 (×6): 20 meq via ORAL
  Filled 2015-12-11 (×6): qty 1

## 2015-12-11 MED ORDER — VANCOMYCIN HCL 10 G IV SOLR
2500.0000 mg | Freq: Once | INTRAVENOUS | Status: AC
  Administered 2015-12-11: 2500 mg via INTRAVENOUS
  Filled 2015-12-11: qty 2500

## 2015-12-11 MED ORDER — ONDANSETRON HCL 4 MG PO TABS: 4.0000 mg | ORAL_TABLET | Freq: Four times a day (QID) | ORAL | Status: DC | PRN

## 2015-12-11 MED ORDER — ONDANSETRON HCL 4 MG/2ML IJ SOLN: 4.0000 mg | Freq: Four times a day (QID) | INTRAMUSCULAR | Status: DC | PRN

## 2015-12-11 MED ORDER — POLYETHYLENE GLYCOL 3350 17 G PO PACK
17.0000 g | PACK | Freq: Every day | ORAL | Status: DC
  Administered 2015-12-12 – 2015-12-17 (×5): 17 g via ORAL
  Filled 2015-12-11 (×6): qty 1

## 2015-12-11 MED ORDER — CARVEDILOL 3.125 MG PO TABS
3.1250 mg | ORAL_TABLET | Freq: Two times a day (BID) | ORAL | Status: DC
  Administered 2015-12-12 – 2015-12-15 (×8): 3.125 mg via ORAL
  Filled 2015-12-11 (×8): qty 1

## 2015-12-11 MED ORDER — LEVETIRACETAM 500 MG PO TABS
500.0000 mg | ORAL_TABLET | Freq: Two times a day (BID) | ORAL | Status: DC
  Administered 2015-12-11 – 2015-12-17 (×12): 500 mg via ORAL
  Filled 2015-12-11 (×13): qty 1

## 2015-12-11 MED ORDER — FERROUS SULFATE 325 (65 FE) MG PO TABS
325.0000 mg | ORAL_TABLET | Freq: Every day | ORAL | Status: DC
  Administered 2015-12-12 – 2015-12-15 (×4): 325 mg via ORAL
  Filled 2015-12-11 (×4): qty 1

## 2015-12-11 MED ORDER — PANTOPRAZOLE SODIUM 40 MG PO TBEC
40.0000 mg | DELAYED_RELEASE_TABLET | Freq: Two times a day (BID) | ORAL | Status: DC
  Administered 2015-12-12 – 2015-12-17 (×11): 40 mg via ORAL
  Filled 2015-12-11 (×11): qty 1

## 2015-12-11 MED ORDER — ACETAMINOPHEN 650 MG RE SUPP
650.0000 mg | Freq: Four times a day (QID) | RECTAL | Status: DC | PRN
Start: 2015-12-11 — End: 2015-12-17

## 2015-12-11 MED ORDER — APIXABAN 5 MG PO TABS
5.0000 mg | ORAL_TABLET | Freq: Two times a day (BID) | ORAL | Status: DC
  Administered 2015-12-11 – 2015-12-17 (×12): 5 mg via ORAL
  Filled 2015-12-11 (×12): qty 1

## 2015-12-11 MED ORDER — GUAIFENESIN ER 600 MG PO TB12
1200.0000 mg | ORAL_TABLET | Freq: Two times a day (BID) | ORAL | Status: DC
  Administered 2015-12-11 – 2015-12-17 (×12): 1200 mg via ORAL
  Filled 2015-12-11 (×12): qty 2

## 2015-12-11 MED ORDER — LEVOFLOXACIN IN D5W 750 MG/150ML IV SOLN
750.0000 mg | INTRAVENOUS | Status: DC
  Administered 2015-12-12: 750 mg via INTRAVENOUS
  Filled 2015-12-11: qty 150

## 2015-12-11 MED ORDER — DEXTROSE 5 % IV SOLN
2.0000 g | INTRAVENOUS | Status: DC
Start: 2015-12-12 — End: 2015-12-11

## 2015-12-11 MED ORDER — SODIUM CHLORIDE 0.9 % IV BOLUS (SEPSIS)
1000.0000 mL | Freq: Once | INTRAVENOUS | Status: AC
  Administered 2015-12-11: 1000 mL via INTRAVENOUS

## 2015-12-11 MED ORDER — ACETAMINOPHEN 325 MG PO TABS: 650.0000 mg | ORAL_TABLET | Freq: Four times a day (QID) | ORAL | Status: DC | PRN

## 2015-12-11 MED ORDER — INSULIN ASPART 100 UNIT/ML ~~LOC~~ SOLN
0.0000 [IU] | Freq: Three times a day (TID) | SUBCUTANEOUS | Status: DC
  Administered 2015-12-12: 2 [IU] via SUBCUTANEOUS
  Administered 2015-12-12: 1 [IU] via SUBCUTANEOUS
  Administered 2015-12-13: 2 [IU] via SUBCUTANEOUS
  Administered 2015-12-13: 3 [IU] via SUBCUTANEOUS
  Administered 2015-12-13: 2 [IU] via SUBCUTANEOUS
  Administered 2015-12-14 (×2): 1 [IU] via SUBCUTANEOUS
  Administered 2015-12-14 – 2015-12-15 (×3): 2 [IU] via SUBCUTANEOUS
  Administered 2015-12-15 – 2015-12-16 (×2): 3 [IU] via SUBCUTANEOUS
  Administered 2015-12-16: 2 [IU] via SUBCUTANEOUS
  Administered 2015-12-16: 1 [IU] via SUBCUTANEOUS
  Administered 2015-12-17 (×2): 2 [IU] via SUBCUTANEOUS

## 2015-12-11 NOTE — H&P (Signed)
History and Physical    [name] [id number] DOB: 1926/10/10 DOA: 12/11/2015  PCP: Philis Fendt, MD  Patient coming from: Home.  Chief Complaint: Fever chills confusion and hypoxia.  History obtained from patient's daughter.  HPI: Elizabeth Pugh is a 82 y.o. female with atrial fibrillation, diastolic CHF, diabetes mellitus type 2 and possible OSA was brought to the ER after patient had sudden onset of fever and chills last evening around 4 PM which was followed by confusion and 3 episodes of nausea vomiting. Patient also had complained of some left-sided chest pain around the left breast. EMS was called and patient was brought to the ER. Patient was tachycardic and blood pressure was low normal. Patient had a fever of 103F in the ER. Abdomen appears benign. Chest x-ray and UA were unremarkable. As per the patient's daughter patient had been treated for UTI 2 weeks ago with Bactrim. Patient's lactic acid has been elevated for which patient was given fluid bolus. Patient has been empirically started on antibiotics for possible developing sepsis with unclear source. After arrival in the ER patient has not had any further episodes of chest pain. By the time I examined the patient patient is alert and awake and following commands. And as per the daughter patient is back to her baseline. Patient is chronically bedbound secondary to inoperable left lower extremity fracture.  ED Course: CT head and chest x-ray were done which were unremarkable. Fluid bolus was given for possible developing sepsis. Started on empiric antibiotics.  Review of Systems: As per HPI, rest all negative.   Past Medical History:  Diagnosis Date  . Acute delirium 04/12/2011  . Acute kidney injury ([place]) 09/02/2015  . Anemia   . Anxiety   . Arthritis   . Atrial fibrillation ([place])   . Blood transfusion   . Bronchitis   . CHF (congestive heart failure) ([place])   . Diabetes mellitus   . GERD (gastroesophageal  reflux disease)   . GI (gastrointestinal bleed)    Due to benign mass  . Hypertension   . Lobar pneumonia due to unspecified organism 09/02/2015  . Restless leg syndrome   . Seizures ([place])   . Stroke [facility])     Past Surgical History:  Procedure Laterality Date  . ABDOMINAL HYSTERECTOMY    . EUS  09/24/2011   Procedure: UPPER ENDOSCOPIC ULTRASOUND (EUS) LINEAR;  Surgeon: Beryle Beams, MD;  Location: [facility] ENDOSCOPY;  Service: Endoscopy;  Laterality: N/A;  . EYE SURGERY     Cataract removal  . TUBAL LIGATION       reports that she quit smoking about 30 years ago. Her smoking use included Cigarettes. She has never used smokeless tobacco. She reports that she does not drink alcohol or use drugs.  Allergies  Allergen Reactions  . Erythromycin Other (See Comments)    Vaginal itching  . Penicillins Rash    Has patient had a PCN reaction causing immediate rash, facial/tongue/throat swelling, SOB or lightheadedness with hypotension: No Has patient had a PCN reaction causing severe rash involving mucus membranes or skin necrosis: No Has patient had a PCN reaction that required hospitalization: No Has patient had a PCN reaction occurring within the last 10 years: No If all of the above answers are "NO", then may proceed with Cephalosporin use.  Marland Kitchen Zithromax [Azithromycin] Other (See Comments)    gave her a yeast infection.    Family History  Problem Relation Age of Onset  . Aneurysm Mother   .  Heart attack Father   . Heart failure Sister   . Asthma Sister   . Kidney failure Sister   . Stroke Sister     Prior to Admission medications   Medication Sig Start Date End Date Taking? Authorizing Provider  albuterol (PROVENTIL HFA;VENTOLIN HFA) 108 (90 BASE) MCG/ACT inhaler Inhale 1 puff into the lungs every 6 (six) hours as needed for wheezing or shortness of breath.   Yes Historical Provider, MD  apixaban (ELIQUIS) 5 MG TABS tablet Take 1 tablet (5 mg total) by mouth 2 (two) times daily.  09/27/15  Yes Lavina Hamman, MD  calcium-vitamin D (CALCIUM 500+D) 500-400 MG-UNIT per tablet Take 1 tablet by mouth 2 (two) times daily.   Yes Historical Provider, MD  carvedilol (COREG) 3.125 MG tablet Take 1 tablet (3.125 mg total) by mouth 2 (two) times daily with a meal. 10/01/15  Yes Lavina Hamman, MD  ferrous sulfate 325 (65 FE) MG tablet Take 325 mg by mouth daily with breakfast.   Yes Historical Provider, MD  furosemide (LASIX) 20 MG tablet Take 3 tablets (60 mg total) by mouth daily. 10/01/15  Yes Lavina Hamman, MD  furosemide (LASIX) 40 MG tablet Take 40 mg by mouth 2 (two) times daily.   Yes Historical Provider, MD  glimepiride (AMARYL) 2 MG tablet Take 2 mg by mouth daily before breakfast.   Yes Historical Provider, MD  guaiFENesin (MUCINEX) 600 MG 12 hr tablet Take 2 tablets (1,200 mg total) by mouth 2 (two) times daily. 09/02/15  Yes Nishant Dhungel, MD  insulin aspart (NOVOLOG) 100 UNIT/ML injection Inject 0-12 Units into the skin 3 (three) times daily before meals. Under 150 = 0 units, 150 - 200 = 2 units, 201- 250 = 4 units, 251- 300 = 6 units, 301- 350 = 8 units, 351-400 = 10 units, 401-450 = 12  units   Yes Historical Provider, MD  insulin glargine (LANTUS) 100 UNIT/ML injection Inject 0.15 mLs (15 Units total) into the skin at bedtime. Patient taking differently: Inject 30 Units into the skin at bedtime.  09/27/15  Yes Lavina Hamman, MD  ipratropium-albuterol (DUONEB) 0.5-2.5 (3) MG/3ML SOLN Take 3 mLs by nebulization every 6 (six) hours as needed. Patient taking differently: Take 3 mLs by nebulization every 6 (six) hours as needed (FOR SOB).  01/25/15  Yes [name], MD  levETIRAcetam (KEPPRA) 500 MG tablet Take 1 tablet (500 mg total) by mouth 2 (two) times daily. 02/27/12  Yes Monika Salk, MD  pantoprazole (PROTONIX) 40 MG tablet Take 1 tablet (40 mg total) by mouth 2 (two) times daily before a meal. 10/01/15  Yes Lavina Hamman, MD  polyethylene glycol (MIRALAX / GLYCOLAX)  packet Take 17 g by mouth daily. 09/27/15  Yes Lavina Hamman, MD  potassium chloride SA (K-DUR,KLOR-CON) 20 MEQ tablet Take 1 tablet (20 mEq total) by mouth daily. 09/27/15  Yes Lavina Hamman, MD  pregabalin (LYRICA) 75 MG capsule Take 75 mg by mouth 2 (two) times daily.   Yes Historical Provider, MD  rOPINIRole (REQUIP) 2 MG tablet Take 1 tablet (2 mg total) by mouth 3 (three) times daily. 09/27/15  Yes Lavina Hamman, MD  Vitamin D, Ergocalciferol, (DRISDOL) 50000 units CAPS capsule Take 50,000 Units by mouth every 7 (seven) days. No specific day   Yes Historical Provider, MD  VOLTAREN 1 % GEL Apply 1 application topically 4 (four) times daily as needed (knee pain). For pain 10/26/11  Yes  Historical Provider, MD  ALPRAZolam Duanne Moron) 0.25 MG tablet Take 1 tablet (0.25 mg total) by mouth 2 (two) times daily as needed for anxiety. Patient not taking: Reported on 10/14/2015 01/28/15   Geradine Girt, DO    Physical Exam: Vitals:   12/11/15 2130 12/11/15 2156 12/11/15 2219 12/11/15 2221  BP: 109/60  91/63   Pulse: 93  85   Resp:   20   Temp:  101.5 F (38.6 C) 99.6 F (37.6 C)   TempSrc:  Rectal Oral   SpO2: 98%  99%   Weight:    (!) 303 lb 11.2 oz (137.8 kg)  Height:    5\' 7"  (1.702 m)      Constitutional: Not in distress. Vitals:   12/11/15 2130 12/11/15 2156 12/11/15 2219 12/11/15 2221  BP: 109/60  91/63   Pulse: 93  85   Resp:   20   Temp:  101.5 F (38.6 C) 99.6 F (37.6 C)   TempSrc:  Rectal Oral   SpO2: 98%  99%   Weight:    (!) 303 lb 11.2 oz (137.8 kg)  Height:    5\' 7"  (1.702 m)   Eyes: Anicteric. no pallor. ENMT: No discharge from the ears eyes nose or mouth. Neck: No neck rigidity no mass felt. Respiratory: No rhonchi or crepitations. Cardiovascular: S1-S2 heard. Abdomen: Soft nontender bowel sounds present. No guarding or rigidity. Musculoskeletal: Mild edema of both lower extremity. Skin: No obvious rash. Neurologic: Alert awake oriented to time place and  person. Does not move her left lower extremity secondary to old fracture. Psychiatric: Appears normal.   Labs on Admission: I have personally reviewed following labs and imaging studies  CBC:  Recent Labs Lab 12/11/15 1831  WBC 9.5  NEUTROABS 8.6*  HGB 12.2  HCT 40.4  MCV 96.4  PLT 123XX123*   Basic Metabolic Panel:  Recent Labs Lab 12/11/15 1831  NA 136  K 4.6  CL 96*  CO2 32  GLUCOSE 194*  BUN 15  CREATININE 0.92  CALCIUM 9.0   GFR: Estimated Creatinine Clearance: 62.6 mL/min (by C-G formula based on SCr of 0.92 mg/dL). Liver Function Tests:  Recent Labs Lab 12/11/15 1831  AST 22  ALT 14  ALKPHOS 61  BILITOT 1.2  PROT 6.7  ALBUMIN 3.4*   No results for input(s): LIPASE, AMYLASE in the last 168 hours.  Recent Labs Lab 12/11/15 1831  AMMONIA 51*   Coagulation Profile:  Recent Labs Lab 12/11/15 1831  INR 1.27   Cardiac Enzymes: No results for input(s): CKTOTAL, CKMB, CKMBINDEX, TROPONINI in the last 168 hours. BNP (last 3 results) No results for input(s): PROBNP in the last 8760 hours. HbA1C: No results for input(s): HGBA1C in the last 72 hours. CBG: No results for input(s): GLUCAP in the last 168 hours. Lipid Profile: No results for input(s): CHOL, HDL, LDLCALC, TRIG, CHOLHDL, LDLDIRECT in the last 72 hours. Thyroid Function Tests: No results for input(s): TSH, T4TOTAL, FREET4, T3FREE, THYROIDAB in the last 72 hours. Anemia Panel: No results for input(s): VITAMINB12, FOLATE, FERRITIN, TIBC, IRON, RETICCTPCT in the last 72 hours. Urine analysis:    Component Value Date/Time   COLORURINE YELLOW 12/11/2015 [place] 12/11/2015 1733   LABSPEC 1.017 12/11/2015 1733   PHURINE 5.0 12/11/2015 1733   GLUCOSEU NEGATIVE 12/11/2015 1733   HGBUR TRACE (A) 12/11/2015 1733   BILIRUBINUR NEGATIVE 12/11/2015 1733   KETONESUR NEGATIVE 12/11/2015 1733   PROTEINUR NEGATIVE 12/11/2015 1733   UROBILINOGEN  1.0 01/19/2015 2241   NITRITE  NEGATIVE 12/11/2015 1733   LEUKOCYTESUR NEGATIVE 12/11/2015 1733   Sepsis Labs: @LABRCNTIP (procalcitonin:4,lacticidven:4) )No results found for this or any previous visit (from the past 240 hour(s)).   Radiological Exams on Admission: Dg Chest 2 View  Result Date: 12/11/2015 CLINICAL DATA:  Shortness of breath and tachycardia EXAM: CHEST  2 VIEW COMPARISON:  09/22/2015 FINDINGS: Cardiac shadow is mildly enlarged. Prominence of the central pulmonary vascularity is again noted likely related to some primary pulmonary hypertension. No focal confluent infiltrate is seen. The previously seen posterior fusion on the right is again noted but smaller. Bony structures are within normal limits. IMPRESSION: Decrease in right-sided pleural effusion. No acute infiltrate is noted. Electronically Signed   By: Inez Catalina M.D.   On: 12/11/2015 18:15   Ct Head Wo Contrast  Result Date: 12/11/2015 CLINICAL DATA:  Confusion EXAM: CT HEAD WITHOUT CONTRAST TECHNIQUE: Contiguous axial images were obtained from the base of the skull through the vertex without intravenous contrast. COMPARISON:  09/26/2015 FINDINGS: Bony calvarium is intact. Atrophic changes are noted. No findings to suggest acute hemorrhage, acute infarction or space-occupying mass lesion are noted. IMPRESSION: Atrophic changes without acute abnormality. Electronically Signed   By: Inez Catalina M.D.   On: 12/11/2015 19:23     Assessment/Plan Principal Problem:   SIRS (systemic inflammatory response syndrome) ([facility]) Active Problems:   DM (diabetes mellitus), type 2, uncontrolled with complications ([facility])   Hypertension   Seizure disorder ([place])   Chronic diastolic heart failure ([facility])   Acute encephalopathy   Chronic respiratory failure with hypoxia and hypercapnia ([place])    1. SIRS - source not clear. Patient was recently treated for UTI. Patient also had 3 episodes of nausea vomiting. Likely source could be intra-abdominal. Check sonogram of the  abdomen and follow cultures of urine and blood. Continue with hydration. Note that patient has history of CHF. Patient is presently on vancomycin and Azactam and Levaquin since patient is allergic to penicillin. 2. Acute encephalopathy probably secondary to developing sepsis which has improved. Ammonia level is minimally elevated recheck in a.m. CT head was unremarkable. 3. Chronic A. Fib - on Apixaban and Coreg. Chads 2 vasc score is 8. 4. Diastolic CHF with last EF measured in September 2016 was 55-60% - holding of Lasix secondary to #1. Closely monitor respiratory status and daily weights. 5. Diabetes mellitus type 2 - continue Lantus insulin with sliding scale coverage. Hold oral hypoglycemics. 6. Seizures - on Keppra. As per patient's daughter patient has not had any seizures for last many years. 7. Morbid obesity with possible OSA - as per patient daughter patient is in the process of getting a sleep study scheduled. 8. Chronically bedbound secondary to inoperable left lower extremity fracture.  EKG is pending.   DVT prophylaxis: Apixaban. Code Status: DO NOT RESUSCITATE.  Family Communication: Patient's daughter.  Disposition Plan: Home.  Consults called: None.  Admission status: Observation.    Rise Patience MD [facility] Pager [phone].  If 7PM-7AM, please contact night-coverage [email or web address] Password [email or web address]  12/11/2015, 10:32 PM

## 2015-12-11 NOTE — Progress Notes (Signed)
Pt a/o - forgetful, admit w/fever, VSS upon arrival to unit, no c/o pain, pt stable

## 2015-12-11 NOTE — ED Triage Notes (Addendum)
Pt to ED from home by EMS c/o SOB x 1 week with NV and fever. Pt altered on arrival. Alert to self only. EMS gave 4mg  zofran enroute.

## 2015-12-11 NOTE — ED Provider Notes (Signed)
[facility] DEPT Provider Note   CSN: [id number] Arrival date & time: 12/11/15  1650  First Provider Contact:  None       History   Chief Complaint Chief Complaint  Patient presents with  . Shortness of Breath    HPI Elizabeth Pugh is a 82 y.o. female.  Patient found to be confused by family. Her oxygen was off. Family and placed oxygen patient's confusion at a little better called EMS. On arrival here found to be febrile and tachycardic. Patient has history of multiple UTIs. Recent treatment with Bactrim ending the last week.   The history is provided by the patient.  Fever   This is a new problem. The current episode started less than 1 hour ago. The problem occurs constantly. The problem has not changed since onset.The maximum temperature noted was 103 to 104 F. Pertinent negatives include no chest pain. Associated symptoms comments: Confusion, malodorous urine. She has tried nothing for the symptoms. The treatment provided no relief.    Past Medical History:  Diagnosis Date  . Acute delirium 04/12/2011  . Acute kidney injury ([place]) 09/02/2015  . Anemia   . Anxiety   . Arthritis   . Atrial fibrillation ([place])   . Blood transfusion   . Bronchitis   . CHF (congestive heart failure) ([place])   . Diabetes mellitus   . GERD (gastroesophageal reflux disease)   . GI (gastrointestinal bleed)    Due to benign mass  . Hypertension   . Lobar pneumonia due to unspecified organism 09/02/2015  . Restless leg syndrome   . Seizures ([place])   . Stroke [facility])     Patient Active Problem List   Diagnosis Date Noted  . Chronic respiratory failure with hypoxia and hypercapnia ([place]) 09/28/2015  . Acute encephalopathy   . Acute on chronic congestive heart failure ([place])   . Needs sleep apnea assessment   . Acute on chronic diastolic (congestive) heart failure ([place]) 09/22/2015  . Acute kidney injury ([place]) 09/02/2015  . Lobar pneumonia due to unspecified organism 09/02/2015  . Bilateral leg  edema   . Acute respiratory failure with hypoxia ([place]) 08/29/2015  . Acute bronchitis 08/29/2015  . Acute hyperkalemia 08/29/2015  . History of pulmonary embolism 01/27/2015  . Chest pain at rest 01/27/2015  . Shortness of breath at rest 01/27/2015  . Chronic diastolic heart failure ([place]) 01/27/2015  . Thrombocytopenia ([place]) 01/27/2015  . Abnormal findings on radiological examination of gastrointestinal tract 01/27/2015  . Chronic atrial fibrillation ([place])   . Seizure disorder ([place])   . Anxiety   . Anemia   . Restless leg syndrome   . Acute delirium 04/12/2011  . DM (diabetes mellitus), type 2, uncontrolled with complications ([place]) 0000000  . CVA, old, hemiparesis ([place]) 03/25/2011  . Hypertension 03/25/2011    Past Surgical History:  Procedure Laterality Date  . ABDOMINAL HYSTERECTOMY    . EUS  09/24/2011   Procedure: UPPER ENDOSCOPIC ULTRASOUND (EUS) LINEAR;  Surgeon: Beryle Beams, MD;  Location: [facility] ENDOSCOPY;  Service: Endoscopy;  Laterality: N/A;  . EYE SURGERY     Cataract removal  . TUBAL LIGATION      OB History    No data available       Home Medications    Prior to Admission medications   Medication Sig Start Date End Date Taking? Authorizing Provider  albuterol (PROVENTIL HFA;VENTOLIN HFA) 108 (90 BASE) MCG/ACT inhaler Inhale 1 puff into the lungs every 6 (six) hours as needed  for wheezing or shortness of breath.   Yes Historical Provider, MD  apixaban (ELIQUIS) 5 MG TABS tablet Take 1 tablet (5 mg total) by mouth 2 (two) times daily. 09/27/15  Yes Lavina Hamman, MD  calcium-vitamin D (CALCIUM 500+D) 500-400 MG-UNIT per tablet Take 1 tablet by mouth 2 (two) times daily.   Yes Historical Provider, MD  carvedilol (COREG) 3.125 MG tablet Take 1 tablet (3.125 mg total) by mouth 2 (two) times daily with a meal. 10/01/15  Yes Lavina Hamman, MD  ferrous sulfate 325 (65 FE) MG tablet Take 325 mg by mouth daily with breakfast.   Yes Historical Provider, MD    furosemide (LASIX) 20 MG tablet Take 3 tablets (60 mg total) by mouth daily. 10/01/15  Yes Lavina Hamman, MD  furosemide (LASIX) 40 MG tablet Take 40 mg by mouth 2 (two) times daily.   Yes Historical Provider, MD  glimepiride (AMARYL) 2 MG tablet Take 2 mg by mouth daily before breakfast.   Yes Historical Provider, MD  guaiFENesin (MUCINEX) 600 MG 12 hr tablet Take 2 tablets (1,200 mg total) by mouth 2 (two) times daily. 09/02/15  Yes Nishant Dhungel, MD  insulin aspart (NOVOLOG) 100 UNIT/ML injection Inject 0-12 Units into the skin 3 (three) times daily before meals. Under 150 = 0 units, 150 - 200 = 2 units, 201- 250 = 4 units, 251- 300 = 6 units, 301- 350 = 8 units, 351-400 = 10 units, 401-450 = 12  units   Yes Historical Provider, MD  insulin glargine (LANTUS) 100 UNIT/ML injection Inject 0.15 mLs (15 Units total) into the skin at bedtime. Patient taking differently: Inject 30 Units into the skin at bedtime.  09/27/15  Yes Lavina Hamman, MD  ipratropium-albuterol (DUONEB) 0.5-2.5 (3) MG/3ML SOLN Take 3 mLs by nebulization every 6 (six) hours as needed. Patient taking differently: Take 3 mLs by nebulization every 6 (six) hours as needed (FOR SOB).  01/25/15  Yes [name], MD  levETIRAcetam (KEPPRA) 500 MG tablet Take 1 tablet (500 mg total) by mouth 2 (two) times daily. 02/27/12  Yes Monika Salk, MD  pantoprazole (PROTONIX) 40 MG tablet Take 1 tablet (40 mg total) by mouth 2 (two) times daily before a meal. 10/01/15  Yes Lavina Hamman, MD  polyethylene glycol (MIRALAX / GLYCOLAX) packet Take 17 g by mouth daily. 09/27/15  Yes Lavina Hamman, MD  potassium chloride SA (K-DUR,KLOR-CON) 20 MEQ tablet Take 1 tablet (20 mEq total) by mouth daily. 09/27/15  Yes Lavina Hamman, MD  pregabalin (LYRICA) 75 MG capsule Take 75 mg by mouth 2 (two) times daily.   Yes Historical Provider, MD  rOPINIRole (REQUIP) 2 MG tablet Take 1 tablet (2 mg total) by mouth 3 (three) times daily. 09/27/15  Yes Lavina Hamman,  MD  Vitamin D, Ergocalciferol, (DRISDOL) 50000 units CAPS capsule Take 50,000 Units by mouth every 7 (seven) days. No specific day   Yes Historical Provider, MD  VOLTAREN 1 % GEL Apply 1 application topically 4 (four) times daily as needed (knee pain). For pain 10/26/11  Yes Historical Provider, MD  ALPRAZolam (XANAX) 0.25 MG tablet Take 1 tablet (0.25 mg total) by mouth 2 (two) times daily as needed for anxiety. Patient not taking: Reported on 10/14/2015 01/28/15   Geradine Girt, DO    Family History Family History  Problem Relation Age of Onset  . Aneurysm Mother   . Heart attack Father   . Heart  failure Sister   . Asthma Sister   . Kidney failure Sister   . Stroke Sister     Social History Social History  Substance Use Topics  . Smoking status: Former Smoker    Types: Cigarettes    Quit date: 09/21/1985  . Smokeless tobacco: Never Used  . Alcohol use No     Allergies   Erythromycin; Penicillins; and Zithromax [azithromycin]   Review of Systems Review of Systems  Unable to perform ROS: Mental status change  Constitutional: Positive for fever.  Respiratory: Positive for shortness of breath.   Cardiovascular: Negative for chest pain.  Gastrointestinal: Negative for abdominal pain.  All other systems reviewed and are negative.    Physical Exam Updated Vital Signs BP 127/76   Pulse 101   Temp (!) 103.5 F (39.7 C) (Rectal)   Resp 23   Wt 300 lb (136.1 kg)   SpO2 96%   BMI 48.42 kg/m   Physical Exam  Constitutional: She appears well-developed and well-nourished.  Obese elderly female.  HENT:  Head: Normocephalic and atraumatic.  Eyes: Right eye exhibits no discharge.  Cardiovascular: Normal rate.   Pulmonary/Chest: Effort normal and breath sounds normal. No respiratory distress. She exhibits no tenderness.  On oxygen.  Abdominal: She exhibits no distension.  Neurological:  Oriented to self only.  Skin: Skin is warm and dry. She is not diaphoretic.    Psychiatric: She has a normal mood and affect.  Nursing note and vitals reviewed.    ED Treatments / Results  Labs (all labs ordered are listed, but only abnormal results are displayed) Labs Reviewed  COMPREHENSIVE METABOLIC PANEL - Abnormal; Notable for the following:       Result Value   Chloride 96 (*)    Glucose, Bld 194 (*)    Albumin 3.4 (*)    GFR calc non Af Amer 54 (*)    All other components within normal limits  CBC WITH DIFFERENTIAL/PLATELET - Abnormal; Notable for the following:    Platelets 122 (*)    Neutro Abs 8.6 (*)    Lymphs Abs 0.5 (*)    All other components within normal limits  URINALYSIS, ROUTINE W REFLEX MICROSCOPIC (NOT AT [facility]) - Abnormal; Notable for the following:    Hgb urine dipstick TRACE (*)    All other components within normal limits  AMMONIA - Abnormal; Notable for the following:    Ammonia 51 (*)    All other components within normal limits  PROTIME-INR - Abnormal; Notable for the following:    Prothrombin Time 16.0 (*)    All other components within normal limits  BRAIN NATRIURETIC PEPTIDE - Abnormal; Notable for the following:    B Natriuretic Peptide 155.3 (*)    All other components within normal limits  URINE MICROSCOPIC-ADD ON - Abnormal; Notable for the following:    Squamous Epithelial / LPF 0-5 (*)    Bacteria, UA RARE (*)    Casts HYALINE CASTS (*)    All other components within normal limits  CULTURE, BLOOD (ROUTINE X 2)  CULTURE, BLOOD (ROUTINE X 2)  URINE CULTURE  I-STAT CG4 LACTIC ACID, ED  I-STAT TROPOININ, ED  I-STAT CG4 LACTIC ACID, ED  I-STAT CG4 LACTIC ACID, ED    EKG  EKG Interpretation None       Radiology Dg Chest 2 View  Result Date: 12/11/2015 CLINICAL DATA:  Shortness of breath and tachycardia EXAM: CHEST  2 VIEW COMPARISON:  09/22/2015 FINDINGS: Cardiac shadow is mildly  enlarged. Prominence of the central pulmonary vascularity is again noted likely related to some primary pulmonary hypertension.  No focal confluent infiltrate is seen. The previously seen posterior fusion on the right is again noted but smaller. Bony structures are within normal limits. IMPRESSION: Decrease in right-sided pleural effusion. No acute infiltrate is noted. Electronically Signed   By: Inez Catalina M.D.   On: 12/11/2015 18:15   Ct Head Wo Contrast  Result Date: 12/11/2015 CLINICAL DATA:  Confusion EXAM: CT HEAD WITHOUT CONTRAST TECHNIQUE: Contiguous axial images were obtained from the base of the skull through the vertex without intravenous contrast. COMPARISON:  09/26/2015 FINDINGS: Bony calvarium is intact. Atrophic changes are noted. No findings to suggest acute hemorrhage, acute infarction or space-occupying mass lesion are noted. IMPRESSION: Atrophic changes without acute abnormality. Electronically Signed   By: Inez Catalina M.D.   On: 12/11/2015 19:23    Procedures Procedures (including critical care time)  Medications Ordered in ED Medications  sodium chloride 0.9 % bolus 1,000 mL (not administered)  acetaminophen (TYLENOL) tablet 650 mg (650 mg Oral Given 12/11/15 1947)     Initial Impression / Assessment and Plan / ED Course  I have reviewed the triage vital signs and the nursing notes.  Pertinent labs & imaging results that were available during my care of the patient were reviewed by me and considered in my medical decision making (see chart for details).  Clinical Course   Patient is an 82 year old female oriented only to self. Patient has history of A. fib, multiple UTIs, heart failure, obesity, OSA, chronic oxygen at home. According to family patient had increased confusion today. She is febrile on arrival. She has malodorous urine. Patient was recently treated with Bactrim for UTI.  Suspect that patient has sepsis. We'll get lactic, labs.   8:13 PM Elevated lactic of 2.43. Patient was febrile to 103. Patient's urine however looks clear. Her chest x-ray is clear as well. Patient does have  elevated ammonia which may explain her mild confusion. (She said where the wrong hospital, does know who she is)  We will start broad-spectrum antibiotics. Unfortunately I am unable to find a source for infection right now.  Blood cultures have been ordered and sent.  Final Clinical Impressions(s) / ED Diagnoses   Final diagnoses:  None    New Prescriptions New Prescriptions   No medications on file     Mohannad Olivero Julio Alm, MD 12/11/15 2013

## 2015-12-11 NOTE — ED Notes (Signed)
Dr. Mackuen at bedside  

## 2015-12-11 NOTE — Progress Notes (Addendum)
Pharmacy Antibiotic Note  Elizabeth Pugh is a 82 y.o. female admitted on 12/11/2015 with sepsis.  Pharmacy has been consulted for vancomycin and cefepime dosing. Tmax is 103.5 but WBC is WNL. SCr is also WNL.   Plan: - Vancomycin 2500mg  IV x 1 then 1500mg  IV Q24H - Cefepime 2gm IV Q24H - F/u renal fxn, C&S, clinical status and trough at SS  Weight: 300 lb (136.1 kg)  Temp (24hrs), Avg:103.5 F (39.7 C), Min:103.5 F (39.7 C), Max:103.5 F (39.7 C)   Recent Labs Lab 12/11/15 1831  WBC 9.5  CREATININE 0.92    Estimated Creatinine Clearance: 61.2 mL/min (by C-G formula based on SCr of 0.92 mg/dL).    Allergies  Allergen Reactions  . Erythromycin Other (See Comments)    Vaginal itching  . Penicillins Rash    Has patient had a PCN reaction causing immediate rash, facial/tongue/throat swelling, SOB or lightheadedness with hypotension: No Has patient had a PCN reaction causing severe rash involving mucus membranes or skin necrosis: No Has patient had a PCN reaction that required hospitalization: No Has patient had a PCN reaction occurring within the last 10 years: No If all of the above answers are "NO", then may proceed with Cephalosporin use.  Marland Kitchen Zithromax [Azithromycin] Other (See Comments)    gave her a yeast infection.    Antimicrobials this admission: Vanc 8/9>> Cefepime 8/9 x1 Levaquin 8/9>> Aztreonam 8/9>>  Dose adjustments this admission: N/A  Microbiology results: 8/9 BCx x2: 8/9 UCx:  Thank you for allowing pharmacy to be a part of this patient's care.  Rumbarger, Rande Lawman 12/11/2015 8:20 PM   Addendum ([id number]): Admitting MD changed abx to Vancomycin, Aztreonam, and Levaquin.  Plan: Continue Vancomycin Aztreonam 1gm IV q8h Levaquin 750mg  IV q24h Will f/u renal function, micro data, and pt's clinical condition  Sherlon Handing, PharmD, BCPS Clinical pharmacist, pager [phone] 12/11/2015 10:41 PM

## 2015-12-11 NOTE — ED Notes (Signed)
Pt from home for SOB x 1 week, fever, and NV. Pt lives with daughter. Has hx of UTI with confusion. On assessment, pt alert to self only, continues to reach for things that are not present. Pt log rolled on arrival, reddened area noted to R hip with blisters noted to bilateral hips and perineum. Lungs sounds clear and diminished lower lobes.

## 2015-12-12 ENCOUNTER — Observation Stay (HOSPITAL_COMMUNITY): Payer: Medicare Other

## 2015-12-12 DIAGNOSIS — K219 Gastro-esophageal reflux disease without esophagitis: Secondary | ICD-10-CM | POA: Diagnosis present

## 2015-12-12 DIAGNOSIS — G2581 Restless legs syndrome: Secondary | ICD-10-CM | POA: Diagnosis present

## 2015-12-12 DIAGNOSIS — I482 Chronic atrial fibrillation: Secondary | ICD-10-CM | POA: Diagnosis present

## 2015-12-12 DIAGNOSIS — Z8673 Personal history of transient ischemic attack (TIA), and cerebral infarction without residual deficits: Secondary | ICD-10-CM | POA: Diagnosis not present

## 2015-12-12 DIAGNOSIS — N390 Urinary tract infection, site not specified: Secondary | ICD-10-CM | POA: Diagnosis present

## 2015-12-12 DIAGNOSIS — I10 Essential (primary) hypertension: Secondary | ICD-10-CM | POA: Diagnosis not present

## 2015-12-12 DIAGNOSIS — Z87891 Personal history of nicotine dependence: Secondary | ICD-10-CM | POA: Diagnosis not present

## 2015-12-12 DIAGNOSIS — J9612 Chronic respiratory failure with hypercapnia: Secondary | ICD-10-CM | POA: Diagnosis present

## 2015-12-12 DIAGNOSIS — I110 Hypertensive heart disease with heart failure: Secondary | ICD-10-CM | POA: Diagnosis present

## 2015-12-12 DIAGNOSIS — I5032 Chronic diastolic (congestive) heart failure: Secondary | ICD-10-CM | POA: Diagnosis not present

## 2015-12-12 DIAGNOSIS — Z794 Long term (current) use of insulin: Secondary | ICD-10-CM | POA: Diagnosis not present

## 2015-12-12 DIAGNOSIS — Z7901 Long term (current) use of anticoagulants: Secondary | ICD-10-CM | POA: Diagnosis not present

## 2015-12-12 DIAGNOSIS — Z9981 Dependence on supplemental oxygen: Secondary | ICD-10-CM | POA: Diagnosis not present

## 2015-12-12 DIAGNOSIS — R410 Disorientation, unspecified: Secondary | ICD-10-CM | POA: Diagnosis present

## 2015-12-12 DIAGNOSIS — Z6841 Body Mass Index (BMI) 40.0 and over, adult: Secondary | ICD-10-CM | POA: Diagnosis not present

## 2015-12-12 DIAGNOSIS — Z823 Family history of stroke: Secondary | ICD-10-CM | POA: Diagnosis not present

## 2015-12-12 DIAGNOSIS — E118 Type 2 diabetes mellitus with unspecified complications: Secondary | ICD-10-CM | POA: Diagnosis not present

## 2015-12-12 DIAGNOSIS — Z8249 Family history of ischemic heart disease and other diseases of the circulatory system: Secondary | ICD-10-CM | POA: Diagnosis not present

## 2015-12-12 DIAGNOSIS — R6510 Systemic inflammatory response syndrome (SIRS) of non-infectious origin without acute organ dysfunction: Secondary | ICD-10-CM

## 2015-12-12 DIAGNOSIS — G40909 Epilepsy, unspecified, not intractable, without status epilepticus: Secondary | ICD-10-CM | POA: Diagnosis present

## 2015-12-12 DIAGNOSIS — E1165 Type 2 diabetes mellitus with hyperglycemia: Secondary | ICD-10-CM | POA: Diagnosis present

## 2015-12-12 DIAGNOSIS — Z79899 Other long term (current) drug therapy: Secondary | ICD-10-CM | POA: Diagnosis not present

## 2015-12-12 DIAGNOSIS — L03116 Cellulitis of left lower limb: Secondary | ICD-10-CM | POA: Diagnosis not present

## 2015-12-12 DIAGNOSIS — F419 Anxiety disorder, unspecified: Secondary | ICD-10-CM | POA: Diagnosis present

## 2015-12-12 DIAGNOSIS — J9611 Chronic respiratory failure with hypoxia: Secondary | ICD-10-CM | POA: Diagnosis present

## 2015-12-12 DIAGNOSIS — Z87440 Personal history of urinary (tract) infections: Secondary | ICD-10-CM | POA: Diagnosis not present

## 2015-12-12 DIAGNOSIS — G9340 Encephalopathy, unspecified: Secondary | ICD-10-CM | POA: Diagnosis present

## 2015-12-12 DIAGNOSIS — Z880 Allergy status to penicillin: Secondary | ICD-10-CM | POA: Diagnosis not present

## 2015-12-12 DIAGNOSIS — Z66 Do not resuscitate: Secondary | ICD-10-CM | POA: Diagnosis present

## 2015-12-12 LAB — COMPREHENSIVE METABOLIC PANEL
ALT: 10 U/L — AB (ref 14–54)
AST: 19 U/L (ref 15–41)
Albumin: 2.6 g/dL — ABNORMAL LOW (ref 3.5–5.0)
Alkaline Phosphatase: 46 U/L (ref 38–126)
Anion gap: 5 (ref 5–15)
BUN: 18 mg/dL (ref 6–20)
CHLORIDE: 98 mmol/L — AB (ref 101–111)
CO2: 35 mmol/L — AB (ref 22–32)
CREATININE: 1.02 mg/dL — AB (ref 0.44–1.00)
Calcium: 8.2 mg/dL — ABNORMAL LOW (ref 8.9–10.3)
GFR calc Af Amer: 56 mL/min — ABNORMAL LOW (ref >60)
GFR calc non Af Amer: 48 mL/min — ABNORMAL LOW (ref >60)
Glucose, Bld: 229 mg/dL — ABNORMAL HIGH (ref 65–99)
Potassium: 4.4 mmol/L (ref 3.5–5.1)
SODIUM: 138 mmol/L (ref 135–145)
Total Bilirubin: 1.3 mg/dL — ABNORMAL HIGH (ref 0.3–1.2)
Total Protein: 5.5 g/dL — ABNORMAL LOW (ref 6.5–8.1)

## 2015-12-12 LAB — TROPONIN I
TROPONIN I: 0.04 ng/mL — AB (ref <0.03)
Troponin I: 0.04 ng/mL (ref <0.03)
Troponin I: 0.04 ng/mL (ref <0.03)

## 2015-12-12 LAB — PROTIME-INR
INR: 1.37
PROTHROMBIN TIME: 17 s — AB (ref 11.4–15.2)

## 2015-12-12 LAB — CBC WITH DIFFERENTIAL/PLATELET
BASOS ABS: 0 10*3/uL (ref 0.0–0.1)
BASOS PCT: 0 %
EOS ABS: 0 10*3/uL (ref 0.0–0.7)
EOS PCT: 0 %
HCT: 32.8 % — ABNORMAL LOW (ref 36.0–46.0)
Hemoglobin: 10.1 g/dL — ABNORMAL LOW (ref 12.0–15.0)
Lymphocytes Relative: 7 %
Lymphs Abs: 0.8 10*3/uL (ref 0.7–4.0)
MCH: 29.7 pg (ref 26.0–34.0)
MCHC: 30.8 g/dL (ref 30.0–36.0)
MCV: 96.5 fL (ref 78.0–100.0)
Monocytes Absolute: 0.4 10*3/uL (ref 0.1–1.0)
Monocytes Relative: 4 %
Neutro Abs: 10.1 10*3/uL — ABNORMAL HIGH (ref 1.7–7.7)
Neutrophils Relative %: 89 %
PLATELETS: 109 10*3/uL — AB (ref 150–400)
RBC: 3.4 MIL/uL — AB (ref 3.87–5.11)
RDW: 13.9 % (ref 11.5–15.5)
WBC: 11.4 10*3/uL — AB (ref 4.0–10.5)

## 2015-12-12 LAB — CG4 I-STAT (LACTIC ACID): LACTIC ACID, VENOUS: 2.43 mmol/L — AB (ref 0.5–1.9)

## 2015-12-12 LAB — GLUCOSE, CAPILLARY
GLUCOSE-CAPILLARY: 185 mg/dL — AB (ref 65–99)
GLUCOSE-CAPILLARY: 129 mg/dL — AB (ref 65–99)
GLUCOSE-CAPILLARY: 147 mg/dL — AB (ref 65–99)
Glucose-Capillary: 94 mg/dL (ref 65–99)

## 2015-12-12 LAB — URINE CULTURE: Culture: NO GROWTH

## 2015-12-12 LAB — PROCALCITONIN: PROCALCITONIN: 5.46 ng/mL

## 2015-12-12 LAB — LACTIC ACID, PLASMA
Lactic Acid, Venous: 2.4 mmol/L (ref 0.5–1.9)
Lactic Acid, Venous: 1.7 mmol/L (ref 0.5–1.9)

## 2015-12-12 LAB — AMMONIA: Ammonia: 26 umol/L (ref 9–35)

## 2015-12-12 LAB — APTT: APTT: 40 s — AB (ref 24–36)

## 2015-12-12 MED ORDER — LEVOFLOXACIN IN D5W 750 MG/150ML IV SOLN: 750.0000 mg | INTRAVENOUS | Status: DC

## 2015-12-12 NOTE — Progress Notes (Signed)
[facility]  PROGRESS NOTE  Elizabeth Pugh [id number] DOB: 06-11-1926 DOA: 12/11/2015 PCP: Philis Fendt, MD    Brief HPI:   82 y.o. female with atrial fibrillation, diastolic CHF, diabetes mellitus type 2 and possible OSA was brought to the ER after patient had sudden onset of fever and chills last evening around 4 PM which was followed by confusion and 3 episodes of nausea vomiting. Patient also had complained of some left-sided chest pain around the left breast. EMS was called and patient was brought to the ER. Patient was tachycardic and blood pressure was low normal. Patient had a fever of 103F in the ER. Abdomen appears benign. Chest x-ray and UA were unremarkable. As per the patient's daughter patient had been treated for UTI 2 weeks ago with Bactrim. Patient's lactic acid has been elevated for which patient was given fluid bolus. Patient has been empirically started on antibiotics for possible developing sepsis with unclear source     Assessment/Plan:      1. SIRS -  Improving, source not clear. Patient was recently treated for UTI with bactrim.Now   on vancomycin and Azactam and Levaquin since patient is allergic to penicillin. Abdominal ultrasound is normal. Will follow the blood and urine culture results. CXR negative . 2. Acute encephalopathy  resolved, . Ammonia level is 26 this morning. CT head was unremarkable. 3. Chronic A. Fib - on Apixaban and Coreg. Chads 2 vasc score is 8. 4. Diastolic CHF with last EF measured in September 2016 was 55-60% - holding of Lasix secondary to #1. . 5. Diabetes mellitus type 2 - continue Lantus insulin with sliding scale coverage. Hold oral hypoglycemics. 6. Seizures - on Keppra.  7. Morbid obesity with possible OSA - as per patient daughter patient is in the process of getting a sleep study scheduled. 8. Chronically bed boundstatus-  secondary to inoperable left lower extremity fractur  DVT prophylaxis: Apixaban Code Status:  DNR Family Communication: No family at bedside Disposition Plan: Home once medically stable   Consultants:  None   Procedures:  None  Antibiotics:  Vancomycin  Azactam  Levaquin  Subjective   Patient seen and examined, feels better this morning. She denies nausea or vomiting, eating breakfast. Ultrasound abdomen showed no significant abnormality.  Objective    Objective: Vitals:   12/11/15 2219 12/11/15 2221 12/12/15 0407 12/12/15 0429  BP: 91/63  (!) 115/59   Pulse: 85  89   Resp: 20  16   Temp: 99.6 F (37.6 C)  98.2 F (36.8 C)   TempSrc: Oral  Tympanic   SpO2: 99%  100%   Weight:  (!) 137.8 kg (303 lb 11.2 oz)  (!) 137.2 kg (302 lb 8 oz)  Height:  5\' 7"  (1.702 m)      Intake/Output Summary (Last 24 hours) at 12/12/15 1124 Last data filed at 12/12/15 0620  Gross per 24 hour  Intake          1071.25 ml  Output                0 ml  Net          1071.25 ml   Filed Weights   12/11/15 1802 12/11/15 2221 12/12/15 0429  Weight: 136.1 kg (300 lb) (!) 137.8 kg (303 lb 11.2 oz) (!) 137.2 kg (302 lb 8 oz)    Examination:  General exam: Appears calm and comfortable  Respiratory system: Clear to auscultation. Respiratory effort normal. Cardiovascular system: S1 & S2  heard, RRR. No JVD, murmurs, rubs, gallops or clicks. No pedal edema. Gastrointestinal system: Abdomen is nondistended, soft and nontender. No organomegaly or masses felt. Normal bowel sounds heard. Central nervous system: Alert and oriented. No focal neurological deficits. Extremities: Symmetric 5 x 5 power. Skin: No rashes, lesions or ulcers Psychiatry: Judgement and insight appear normal. Mood & affect appropriate.    Data Reviewed: I have personally reviewed following labs and imaging studies Basic Metabolic Panel:  Recent Labs Lab 12/11/15 1831 12/12/15 0050  NA 136 138  K 4.6 4.4  CL 96* 98*  CO2 32 35*  GLUCOSE 194* 229*  BUN 15 18  CREATININE 0.92 1.02*  CALCIUM 9.0 8.2*    Liver Function Tests:  Recent Labs Lab 12/11/15 1831 12/12/15 0050  AST 22 19  ALT 14 10*  ALKPHOS 61 46  BILITOT 1.2 1.3*  PROT 6.7 5.5*  ALBUMIN 3.4* 2.6*   No results for input(s): LIPASE, AMYLASE in the last 168 hours.  Recent Labs Lab 12/11/15 1831 12/12/15 0050  AMMONIA 51* 26   CBC:  Recent Labs Lab 12/11/15 1831 12/12/15 0050  WBC 9.5 11.4*  NEUTROABS 8.6* 10.1*  HGB 12.2 10.1*  HCT 40.4 32.8*  MCV 96.4 96.5  PLT 122* 109*   Cardiac Enzymes:  Recent Labs Lab 12/12/15 0053 12/12/15 0541  TROPONINI 0.04* 0.04*   BNP (last 3 results)  Recent Labs  09/22/15 1513 09/25/15 0440 12/11/15 1831  BNP 173.0* 131.7* 155.3*    ProBNP (last 3 results) No results for input(s): PROBNP in the last 8760 hours.  CBG:  Recent Labs Lab 12/11/15 2346 12/12/15 0609  GLUCAP 196* 185*    No results found for this or any previous visit (from the past 240 hour(s)).   Studies: Dg Chest 2 View  Result Date: 12/11/2015 CLINICAL DATA:  Shortness of breath and tachycardia EXAM: CHEST  2 VIEW COMPARISON:  09/22/2015 FINDINGS: Cardiac shadow is mildly enlarged. Prominence of the central pulmonary vascularity is again noted likely related to some primary pulmonary hypertension. No focal confluent infiltrate is seen. The previously seen posterior fusion on the right is again noted but smaller. Bony structures are within normal limits. IMPRESSION: Decrease in right-sided pleural effusion. No acute infiltrate is noted. Electronically Signed   By: Inez Catalina M.D.   On: 12/11/2015 18:15   Ct Head Wo Contrast  Result Date: 12/11/2015 CLINICAL DATA:  Confusion EXAM: CT HEAD WITHOUT CONTRAST TECHNIQUE: Contiguous axial images were obtained from the base of the skull through the vertex without intravenous contrast. COMPARISON:  09/26/2015 FINDINGS: Bony calvarium is intact. Atrophic changes are noted. No findings to suggest acute hemorrhage, acute infarction or  space-occupying mass lesion are noted. IMPRESSION: Atrophic changes without acute abnormality. Electronically Signed   By: Inez Catalina M.D.   On: 12/11/2015 19:23   US Abdomen Complete  Result Date: 12/12/2015 CLINICAL DATA:  82 year old female inpatient with nausea and vomiting. EXAM: ABDOMEN ULTRASOUND COMPLETE COMPARISON:  07/07/2010 CT abdomen/pelvis. FINDINGS: Gallbladder: No gallstones or wall thickening visualized. No sonographic Murphy sign noted by sonographer. Common bile duct: Diameter: 5 mm Liver: Limited visualization of the liver due to patient related factors. No focal lesion identified. Within normal limits in parenchymal echogenicity. IVC: No abnormality visualized. Pancreas:  Limited visualized portion unremarkable. Spleen: Size and appearance within normal limits. Right Kidney: Length: 10.4 cm. Echogenicity within normal limits. No mass or hydronephrosis visualized. Left Kidney: Length: 11.0 cm. Echogenicity within normal limits. No mass or hydronephrosis visualized. Abdominal  aorta: No aneurysm visualized. Other findings: None. IMPRESSION: No acute abnormality. No cholelithiasis. No biliary ductal dilatation . no hydronephrosis. Electronically Signed   By: Ilona Sorrel M.D.   On: 12/12/2015 09:34    Scheduled Meds: . apixaban  5 mg Oral BID  . aztreonam  1 g Intravenous Q8H  . carvedilol  3.125 mg Oral BID WC  . ferrous sulfate  325 mg Oral Q breakfast  . guaiFENesin  1,200 mg Oral BID  . insulin aspart  0-9 Units Subcutaneous TID WC  . insulin glargine  30 Units Subcutaneous QHS  . levETIRAcetam  500 mg Oral BID  . levofloxacin (LEVAQUIN) IV  750 mg Intravenous Q24H  . pantoprazole  40 mg Oral BID AC  . polyethylene glycol  17 g Oral Daily  . potassium chloride SA  20 mEq Oral Daily  . pregabalin  75 mg Oral BID  . rOPINIRole  2 mg Oral TID  . vancomycin  1,500 mg Intravenous Q24H   Continuous Infusions:      Time spent: 25 min    [facility] Pager [phone]. If 7PM-7AM, please contact night-coverage at [email or web address], Office  [phone]  password [email or web address] 12/12/2015, 11:24 AM  LOS: 0 days

## 2015-12-12 NOTE — Progress Notes (Addendum)
NP Schorr was paged via Amion with to report two critical labs reported by Lab. Patient had a critical Lactic Acid 2.4 and a critical Troponin 0.04. No orders were given.CRITICAL VALUE ALERT  Critical value received:  Lactic Acid 2.4   Date of notification:  12/12/15  Time of notification:   02:42  Critical value read back: yes  Nurse who received alert:  Gomez Cleverly RN  MD notified (1st page):  Schorr  Time of page: 02:45    Critical value received:  Troponin 0.04  Date of notification:  12/12/15  Time of notification:   02:44  Critical value read back: yes  Nurse who received alert:  Gomez Cleverly RN  MD notified (1st page):  Schorr  Time of page: 02:45

## 2015-12-12 NOTE — Progress Notes (Signed)
   12/12/15 1000  Clinical Encounter Type  Visited With Patient  Visit Type Initial  Referral From Nurse  Consult/Referral To Chaplain  Spiritual Encounters  Spiritual Needs Emotional  CHP met with patient and provided ministry of presence and listening.  CHP available for follow up as needed. Roe Coombs 12/12/15

## 2015-12-13 LAB — BLOOD CULTURE ID PANEL (REFLEXED)
Acinetobacter baumannii: NOT DETECTED
CANDIDA ALBICANS: NOT DETECTED
CANDIDA GLABRATA: NOT DETECTED
CANDIDA KRUSEI: NOT DETECTED
Candida parapsilosis: NOT DETECTED
Candida tropicalis: NOT DETECTED
Carbapenem resistance: NOT DETECTED
ENTEROBACTER CLOACAE COMPLEX: NOT DETECTED
ENTEROBACTERIACEAE SPECIES: NOT DETECTED
ESCHERICHIA COLI: NOT DETECTED
Enterococcus species: NOT DETECTED
Haemophilus influenzae: NOT DETECTED
KLEBSIELLA OXYTOCA: NOT DETECTED
Klebsiella pneumoniae: NOT DETECTED
LISTERIA MONOCYTOGENES: NOT DETECTED
METHICILLIN RESISTANCE: DETECTED — AB
NEISSERIA MENINGITIDIS: NOT DETECTED
Proteus species: NOT DETECTED
Pseudomonas aeruginosa: NOT DETECTED
SERRATIA MARCESCENS: NOT DETECTED
STREPTOCOCCUS AGALACTIAE: NOT DETECTED
STREPTOCOCCUS PNEUMONIAE: NOT DETECTED
STREPTOCOCCUS SPECIES: NOT DETECTED
Staphylococcus aureus (BCID): NOT DETECTED
Staphylococcus species: DETECTED — AB
Streptococcus pyogenes: NOT DETECTED
Vancomycin resistance: NOT DETECTED

## 2015-12-13 LAB — GLUCOSE, CAPILLARY
GLUCOSE-CAPILLARY: 166 mg/dL — AB (ref 65–99)
GLUCOSE-CAPILLARY: 199 mg/dL — AB (ref 65–99)
Glucose-Capillary: 236 mg/dL — ABNORMAL HIGH (ref 65–99)
Glucose-Capillary: 224 mg/dL — ABNORMAL HIGH (ref 65–99)

## 2015-12-13 LAB — COMPREHENSIVE METABOLIC PANEL
ALBUMIN: 2.5 g/dL — AB (ref 3.5–5.0)
ALT: 9 U/L — AB (ref 14–54)
AST: 15 U/L (ref 15–41)
Alkaline Phosphatase: 47 U/L (ref 38–126)
Anion gap: 7 (ref 5–15)
BUN: 20 mg/dL (ref 6–20)
CHLORIDE: 98 mmol/L — AB (ref 101–111)
CO2: 32 mmol/L (ref 22–32)
CREATININE: 1.1 mg/dL — AB (ref 0.44–1.00)
Calcium: 8.2 mg/dL — ABNORMAL LOW (ref 8.9–10.3)
GFR calc Af Amer: 51 mL/min — ABNORMAL LOW (ref >60)
GFR calc non Af Amer: 44 mL/min — ABNORMAL LOW (ref >60)
Glucose, Bld: 144 mg/dL — ABNORMAL HIGH (ref 65–99)
POTASSIUM: 4.3 mmol/L (ref 3.5–5.1)
SODIUM: 137 mmol/L (ref 135–145)
Total Bilirubin: 0.9 mg/dL (ref 0.3–1.2)
Total Protein: 5.5 g/dL — ABNORMAL LOW (ref 6.5–8.1)

## 2015-12-13 LAB — CBC
HCT: 32.1 % — ABNORMAL LOW (ref 36.0–46.0)
Hemoglobin: 9.6 g/dL — ABNORMAL LOW (ref 12.0–15.0)
MCH: 29 pg (ref 26.0–34.0)
MCHC: 29.9 g/dL — ABNORMAL LOW (ref 30.0–36.0)
MCV: 97 fL (ref 78.0–100.0)
PLATELETS: 98 10*3/uL — AB (ref 150–400)
RBC: 3.31 MIL/uL — AB (ref 3.87–5.11)
RDW: 14 % (ref 11.5–15.5)
WBC: 5.6 10*3/uL (ref 4.0–10.5)

## 2015-12-13 NOTE — Progress Notes (Signed)
[facility]  PROGRESS NOTE  Elizabeth Pugh [id number] DOB: [birth year] DOA: 12/11/2015 PCP: Philis Fendt, MD    Brief HPI:   82 y.o. female with atrial fibrillation, diastolic CHF, diabetes mellitus type 2 and possible OSA was brought to the ER after patient had sudden onset of fever and chills last evening around 4 PM which was followed by confusion and 3 episodes of nausea vomiting. Patient also had complained of some left-sided chest pain around the left breast. EMS was called and patient was brought to the ER. Patient was tachycardic and blood pressure was low normal. Patient had a fever of 103F in the ER. Abdomen appears benign. Chest x-ray and UA were unremarkable. As per the patient's daughter patient had been treated for UTI 2 weeks ago with Bactrim. Patient's lactic acid has been elevated for which patient was given fluid bolus. Patient has been empirically started on antibiotics for possible developing sepsis with unclear source     Assessment/Plan:      1. Sepsis due to cellulitis- resolving, continue with Vancomycin. Blood culture 1/2 bottles growing MRSA.Continue with IV Vancomycin, will discontinue Levaquin.-  . Abdominal ultrasound is normal. CXR negative . 2. Acute encephalopathy  resolved. Ammonia level is 26 . CT head was unremarkable. 3. Elevated troponin- mild elevation of troponin, likely from demand ischemia. 4. Chronic A. Fib - on Apixaban and Coreg. Chads 2 vasc score is 8. 5. Diastolic CHF with last EF measured in September 2016 was 55-60% - holding of Lasix secondary to #1. . 6. Diabetes mellitus type 2 - continue Lantus insulin with sliding scale coverage. Hold oral hypoglycemics. 7. Seizures - on Keppra.  8. Morbid obesity with possible OSA - as per patient daughter patient is in the process of getting a sleep study scheduled. 9. Chronically bed boundstatus-  secondary to inoperable left lower extremity fracture  DVT prophylaxis: Apixaban Code  Status: DNR Family Communication: No family at bedside Disposition Plan: Home once medically stable   Consultants:  None   Procedures:  None  Antibiotics:  Vancomycin  Azactam  Levaquin  Subjective   Patient seen and examined, feels better this morning. She denies nausea or vomiting.  Objective    Objective: Vitals:   12/12/15 2100 12/13/15 0437 12/13/15 0938 12/13/15 1140  BP: 131/85 109/60 (!) 110/53 114/68  Pulse: 97 84 64 87  Resp: 20 18  18   Temp: 98.2 F (36.8 C) 97.7 F (36.5 C)  97.9 F (36.6 C)  TempSrc: Oral Oral  Oral  SpO2: 100% 100%  100%  Weight:  (!) 140.6 kg (310 lb)    Height:        Intake/Output Summary (Last 24 hours) at 12/13/15 1504 Last data filed at 12/13/15 1331  Gross per 24 hour  Intake             1855 ml  Output                0 ml  Net             1855 ml   Filed Weights   12/11/15 2221 12/12/15 0429 12/13/15 0437  Weight: (!) 137.8 kg (303 lb 11.2 oz) (!) 137.2 kg (302 lb 8 oz) (!) 140.6 kg (310 lb)    Examination:  General exam: Appears calm and comfortable  Respiratory system: Clear to auscultation. Respiratory effort normal. Cardiovascular system: S1 & S2 heard, RRR. No JVD, murmurs, rubs, gallops or clicks. Trace pedal edema bilaterally Gastrointestinal system:  Abdomen is nondistended, soft and nontender. No organomegaly or masses felt. Normal bowel sounds heard. Central nervous system: Alert and oriented. No focal neurological deficits. Extremities: Symmetric 5 x 5 power. Skin: LLE is warm to touch, tender to palpation and erythematous Psychiatry: Judgement and insight appear normal. Mood & affect appropriate.    Data Reviewed: I have personally reviewed following labs and imaging studies Basic Metabolic Panel:  Recent Labs Lab 12/11/15 1831 12/12/15 0050 12/13/15 0300  NA 136 138 137  K 4.6 4.4 4.3  CL 96* 98* 98*  CO2 32 35* 32  GLUCOSE 194* 229* 144*  BUN 15 18 20   CREATININE 0.92 1.02* 1.10*   CALCIUM 9.0 8.2* 8.2*   Liver Function Tests:  Recent Labs Lab 12/11/15 1831 12/12/15 0050 12/13/15 0300  AST 22 19 15   ALT 14 10* 9*  ALKPHOS 61 46 47  BILITOT 1.2 1.3* 0.9  PROT 6.7 5.5* 5.5*  ALBUMIN 3.4* 2.6* 2.5*   No results for input(s): LIPASE, AMYLASE in the last 168 hours.  Recent Labs Lab 12/11/15 1831 12/12/15 0050  AMMONIA 51* 26   CBC:  Recent Labs Lab 12/11/15 1831 12/12/15 0050 12/13/15 0300  WBC 9.5 11.4* 5.6  NEUTROABS 8.6* 10.1*  --   HGB 12.2 10.1* 9.6*  HCT 40.4 32.8* 32.1*  MCV 96.4 96.5 97.0  PLT 122* 109* 98*   Cardiac Enzymes:  Recent Labs Lab 12/12/15 0053 12/12/15 0541 12/12/15 1043  TROPONINI 0.04* 0.04* 0.04*   BNP (last 3 results)  Recent Labs  09/22/15 1513 09/25/15 0440 12/11/15 1831  BNP 173.0* 131.7* 155.3*    ProBNP (last 3 results) No results for input(s): PROBNP in the last 8760 hours.  CBG:  Recent Labs Lab 12/12/15 1123 12/12/15 1619 12/12/15 2059 12/13/15 0621 12/13/15 1138  GLUCAP 94 129* 147* 166* 199*    Recent Results (from the past 240 hour(s))  Urine culture     Status: None   Collection Time: 12/11/15  5:44 PM  Result Value Ref Range Status   Specimen Description URINE, CATHETERIZED  Final   Special Requests NONE  Final   Culture NO GROWTH  Final   Report Status 12/12/2015 FINAL  Final  Culture, blood (Routine x 2)     Status: Abnormal (Preliminary result)   Collection Time: 12/11/15  6:32 PM  Result Value Ref Range Status   Specimen Description BLOOD LEFT HAND  Final   Special Requests BOTTLES DRAWN AEROBIC AND ANAEROBIC 5CC   Final   Culture  Setup Time   Final    GRAM POSITIVE COCCI IN CLUSTERS AEROBIC BOTTLE ONLY CRITICAL RESULT CALLED TO, READ BACK BY AND VERIFIED WITH: TO VBRYK(PHARD) BY TCLEVELAND 12/13/2015 AT 2:57AM    Culture (A)  Final    STAPHYLOCOCCUS SPECIES CULTURE REINCUBATED FOR BETTER GROWTH    Report Status PENDING  Incomplete  Blood Culture ID Panel  (Reflexed)     Status: Abnormal   Collection Time: 12/11/15  6:32 PM  Result Value Ref Range Status   Enterococcus species NOT DETECTED NOT DETECTED Final   Vancomycin resistance NOT DETECTED NOT DETECTED Final   Listeria monocytogenes NOT DETECTED NOT DETECTED Final   Staphylococcus species DETECTED (A) NOT DETECTED Final    Comment: CRITICAL RESULT CALLED TO, READ BACK BY AND VERIFIED WITH: TO VBYRYK(PHARD) BY TCLEVELAND 12/13/2015 AT 1:09AM    Staphylococcus aureus NOT DETECTED NOT DETECTED Final   Methicillin resistance DETECTED (A) NOT DETECTED Final    Comment: CRITICAL RESULT CALLED  TO, READ BACK BY AND VERIFIED WITH: TO VBYRYK(PHARD) BY TCLEVELAND 12/13/2015 AT 1:08AM    Streptococcus species NOT DETECTED NOT DETECTED Final   Streptococcus agalactiae NOT DETECTED NOT DETECTED Final   Streptococcus pneumoniae NOT DETECTED NOT DETECTED Final   Streptococcus pyogenes NOT DETECTED NOT DETECTED Final   Acinetobacter baumannii NOT DETECTED NOT DETECTED Final   Enterobacteriaceae species NOT DETECTED NOT DETECTED Final   Enterobacter cloacae complex NOT DETECTED NOT DETECTED Final   Escherichia coli NOT DETECTED NOT DETECTED Final   Klebsiella oxytoca NOT DETECTED NOT DETECTED Final   Klebsiella pneumoniae NOT DETECTED NOT DETECTED Final   Proteus species NOT DETECTED NOT DETECTED Final   Serratia marcescens NOT DETECTED NOT DETECTED Final   Carbapenem resistance NOT DETECTED NOT DETECTED Final   Haemophilus influenzae NOT DETECTED NOT DETECTED Final   Neisseria meningitidis NOT DETECTED NOT DETECTED Final   Pseudomonas aeruginosa NOT DETECTED NOT DETECTED Final   Candida albicans NOT DETECTED NOT DETECTED Final   Candida glabrata NOT DETECTED NOT DETECTED Final   Candida krusei NOT DETECTED NOT DETECTED Final   Candida parapsilosis NOT DETECTED NOT DETECTED Final   Candida tropicalis NOT DETECTED NOT DETECTED Final  Culture, blood (Routine x 2)     Status: None (Preliminary  result)   Collection Time: 12/11/15  7:30 PM  Result Value Ref Range Status   Specimen Description BLOOD RIGHT HAND  Final   Special Requests IN PEDIATRIC BOTTLE 2CC  Final   Culture NO GROWTH 2 DAYS  Final   Report Status PENDING  Incomplete     Studies: Dg Chest 2 View  Result Date: 12/11/2015 CLINICAL DATA:  Shortness of breath and tachycardia EXAM: CHEST  2 VIEW COMPARISON:  09/22/2015 FINDINGS: Cardiac shadow is mildly enlarged. Prominence of the central pulmonary vascularity is again noted likely related to some primary pulmonary hypertension. No focal confluent infiltrate is seen. The previously seen posterior fusion on the right is again noted but smaller. Bony structures are within normal limits. IMPRESSION: Decrease in right-sided pleural effusion. No acute infiltrate is noted. Electronically Signed   By: Inez Catalina M.D.   On: 12/11/2015 18:15   Ct Head Wo Contrast  Result Date: 12/11/2015 CLINICAL DATA:  Confusion EXAM: CT HEAD WITHOUT CONTRAST TECHNIQUE: Contiguous axial images were obtained from the base of the skull through the vertex without intravenous contrast. COMPARISON:  09/26/2015 FINDINGS: Bony calvarium is intact. Atrophic changes are noted. No findings to suggest acute hemorrhage, acute infarction or space-occupying mass lesion are noted. IMPRESSION: Atrophic changes without acute abnormality. Electronically Signed   By: Inez Catalina M.D.   On: 12/11/2015 19:23   US Abdomen Complete  Result Date: 12/12/2015 CLINICAL DATA:  82 year old female inpatient with nausea and vomiting. EXAM: ABDOMEN ULTRASOUND COMPLETE COMPARISON:  07/07/2010 CT abdomen/pelvis. FINDINGS: Gallbladder: No gallstones or wall thickening visualized. No sonographic Murphy sign noted by sonographer. Common bile duct: Diameter: 5 mm Liver: Limited visualization of the liver due to patient related factors. No focal lesion identified. Within normal limits in parenchymal echogenicity. IVC: No abnormality  visualized. Pancreas:  Limited visualized portion unremarkable. Spleen: Size and appearance within normal limits. Right Kidney: Length: 10.4 cm. Echogenicity within normal limits. No mass or hydronephrosis visualized. Left Kidney: Length: 11.0 cm. Echogenicity within normal limits. No mass or hydronephrosis visualized. Abdominal aorta: No aneurysm visualized. Other findings: None. IMPRESSION: No acute abnormality. No cholelithiasis. No biliary ductal dilatation . no hydronephrosis. Electronically Signed   By: Janina Mayo.D.  On: 12/12/2015 09:34    Scheduled Meds: . apixaban  5 mg Oral BID  . carvedilol  3.125 mg Oral BID WC  . ferrous sulfate  325 mg Oral Q breakfast  . guaiFENesin  1,200 mg Oral BID  . insulin aspart  0-9 Units Subcutaneous TID WC  . insulin glargine  30 Units Subcutaneous QHS  . levETIRAcetam  500 mg Oral BID  . [START ON 12/14/2015] levofloxacin (LEVAQUIN) IV  750 mg Intravenous Q48H  . pantoprazole  40 mg Oral BID AC  . polyethylene glycol  17 g Oral Daily  . potassium chloride SA  20 mEq Oral Daily  . pregabalin  75 mg Oral BID  . rOPINIRole  2 mg Oral TID  . vancomycin  1,500 mg Intravenous Q24H   Continuous Infusions:      Time spent: 25 min    [facility] Pager [phone]. If 7PM-7AM, please contact night-coverage at [email or web address], Office  [phone]  password [email or web address] 12/13/2015, 3:04 PM  LOS: 1 day

## 2015-12-13 NOTE — Progress Notes (Signed)
PHARMACY - PHYSICIAN COMMUNICATION CRITICAL VALUE ALERT - BLOOD CULTURE IDENTIFICATION (BCID)  Results for orders placed or performed during the hospital encounter of 12/11/15  Blood Culture ID Panel (Reflexed) (Collected: 12/11/2015  6:32 PM)  Result Value Ref Range   Enterococcus species NOT DETECTED NOT DETECTED   Vancomycin resistance NOT DETECTED NOT DETECTED   Listeria monocytogenes NOT DETECTED NOT DETECTED   Staphylococcus species DETECTED (A) NOT DETECTED   Staphylococcus aureus NOT DETECTED NOT DETECTED   Methicillin resistance DETECTED (A) NOT DETECTED   Streptococcus species NOT DETECTED NOT DETECTED   Streptococcus agalactiae NOT DETECTED NOT DETECTED   Streptococcus pneumoniae NOT DETECTED NOT DETECTED   Streptococcus pyogenes NOT DETECTED NOT DETECTED   Acinetobacter baumannii NOT DETECTED NOT DETECTED   Enterobacteriaceae species NOT DETECTED NOT DETECTED   Enterobacter cloacae complex NOT DETECTED NOT DETECTED   Escherichia coli NOT DETECTED NOT DETECTED   Klebsiella oxytoca NOT DETECTED NOT DETECTED   Klebsiella pneumoniae NOT DETECTED NOT DETECTED   Proteus species NOT DETECTED NOT DETECTED   Serratia marcescens NOT DETECTED NOT DETECTED   Carbapenem resistance NOT DETECTED NOT DETECTED   Haemophilus influenzae NOT DETECTED NOT DETECTED   Neisseria meningitidis NOT DETECTED NOT DETECTED   Pseudomonas aeruginosa NOT DETECTED NOT DETECTED   Candida albicans NOT DETECTED NOT DETECTED   Candida glabrata NOT DETECTED NOT DETECTED   Candida krusei NOT DETECTED NOT DETECTED   Candida parapsilosis NOT DETECTED NOT DETECTED   Candida tropicalis NOT DETECTED NOT DETECTED    Name of physician (or Provider) Contacted: Forrest Moron, NP, via text page.  Changes to prescribed antibiotics required: On vanc and Levaquin, no changes needed.  Wynona Neat, PharmD, BCPS  12/13/2015  1:35 AM

## 2015-12-14 LAB — BASIC METABOLIC PANEL
ANION GAP: 6 (ref 5–15)
BUN: 18 mg/dL (ref 6–20)
CALCIUM: 8.4 mg/dL — AB (ref 8.9–10.3)
CO2: 33 mmol/L — ABNORMAL HIGH (ref 22–32)
CREATININE: 0.95 mg/dL (ref 0.44–1.00)
Chloride: 99 mmol/L — ABNORMAL LOW (ref 101–111)
GFR calc Af Amer: >60 mL/min (ref >60)
GFR, EST NON AFRICAN AMERICAN: 52 mL/min — AB (ref >60)
GLUCOSE: 168 mg/dL — AB (ref 65–99)
Potassium: 4.4 mmol/L (ref 3.5–5.1)
Sodium: 138 mmol/L (ref 135–145)

## 2015-12-14 LAB — GLUCOSE, CAPILLARY
Glucose-Capillary: 150 mg/dL — ABNORMAL HIGH (ref 65–99)
Glucose-Capillary: 123 mg/dL — ABNORMAL HIGH (ref 65–99)
Glucose-Capillary: 165 mg/dL — ABNORMAL HIGH (ref 65–99)
Glucose-Capillary: 148 mg/dL — ABNORMAL HIGH (ref 65–99)

## 2015-12-14 NOTE — Progress Notes (Signed)
[facility]  PROGRESS NOTE  Elizabeth Pugh [id number] DOB: 08-May-1926 DOA: 12/11/2015 PCP: Philis Fendt, MD    Brief HPI:   82 y.o. female with atrial fibrillation, diastolic CHF, diabetes mellitus type 2 and possible OSA was brought to the ER after patient had sudden onset of fever and chills last evening around 4 PM which was followed by confusion and 3 episodes of nausea vomiting. Patient also had complained of some left-sided chest pain around the left breast. EMS was called and patient was brought to the ER. Patient was tachycardic and blood pressure was low normal. Patient had a fever of 103F in the ER. Abdomen appears benign. Chest x-ray and UA were unremarkable. As per the patient's daughter patient had been treated for UTI 2 weeks ago with Bactrim. Patient's lactic acid has been elevated for which patient was given fluid bolus. Patient has been empirically started on antibiotics for possible developing sepsis with unclear source     Assessment/Plan:      1. Sepsis due to cellulitis- resolving, continue with Vancomycin. Blood culture 1/2 bottles growing coagulase negative staph.Continue with IV Vancomycin for now,  discontinued Levaquin.-  Abdominal ultrasound is normal. CXR negative 2. Acute encephalopathy  resolved. Ammonia level is 26 . CT head was unremarkable. 3. Elevated troponin- mild elevation of troponin, likely from demand ischemia. 4. Chronic A. Fib - on Apixaban and Coreg. Chads 2 vasc score is 8. 5. Diastolic CHF with last EF measured in September 2016 was 55-60% - holding of Lasix secondary to #1. . 6. Diabetes mellitus type 2 - continue Lantus insulin with sliding scale coverage. Hold oral hypoglycemics. 7. Seizures - on Keppra.  8. Morbid obesity with possible OSA - as per patient daughter patient is in the process of getting a sleep study scheduled. 9. Chronically bed boundstatus-  secondary to inoperable left lower extremity fracture  DVT  prophylaxis: Apixaban Code Status: DNR Family Communication: No family at bedside Disposition Plan: Home once medically stable   Consultants:  None   Procedures:  None  Antibiotics:  Vancomycin  Azactam  Levaquin  Subjective   Patient seen and examined, feels better this morning. She denies nausea or vomiting. Has been afebrile.  Objective    Objective: Vitals:   12/13/15 1705 12/13/15 2028 12/14/15 0601 12/14/15 1147  BP: 105/65  (!) 130/92 135/65  Pulse: 72 85 70 81  Resp:  18 10 18   Temp:  98 F (36.7 C) 97.4 F (36.3 C) 97.9 F (36.6 C)  TempSrc:  Oral Oral Oral  SpO2:  100% 100% 98%  Weight:   (!) 143.2 kg (315 lb 9.6 oz)   Height:        Intake/Output Summary (Last 24 hours) at 12/14/15 1412 Last data filed at 12/14/15 1300  Gross per 24 hour  Intake             1160 ml  Output                0 ml  Net             1160 ml   Filed Weights   12/12/15 0429 12/13/15 0437 12/14/15 0601  Weight: (!) 137.2 kg (302 lb 8 oz) (!) 140.6 kg (310 lb) (!) 143.2 kg (315 lb 9.6 oz)    Examination:  General exam: Appears calm and comfortable  Respiratory system: Clear to auscultation. Respiratory effort normal. Cardiovascular system: S1 & S2 heard, RRR. No JVD, murmurs, rubs, gallops or  clicks. Trace pedal edema bilaterally Gastrointestinal system: Abdomen is nondistended, soft and nontender. No organomegaly or masses felt. Normal bowel sounds heard. Central nervous system: Alert and oriented. No focal neurological deficits. Extremities: Symmetric 5 x 5 power. Skin: LLE is warm to touch, tender to palpation and erythematous Psychiatry: Judgement and insight appear normal. Mood & affect appropriate.    Data Reviewed: I have personally reviewed following labs and imaging studies Basic Metabolic Panel:  Recent Labs Lab 12/11/15 1831 12/12/15 0050 12/13/15 0300 12/14/15 0436  NA 136 138 137 138  K 4.6 4.4 4.3 4.4  CL 96* 98* 98* 99*  CO2 32 35* 32 33*   GLUCOSE 194* 229* 144* 168*  BUN 15 18 20 18   CREATININE 0.92 1.02* 1.10* 0.95  CALCIUM 9.0 8.2* 8.2* 8.4*   Liver Function Tests:  Recent Labs Lab 12/11/15 1831 12/12/15 0050 12/13/15 0300  AST 22 19 15   ALT 14 10* 9*  ALKPHOS 61 46 47  BILITOT 1.2 1.3* 0.9  PROT 6.7 5.5* 5.5*  ALBUMIN 3.4* 2.6* 2.5*   No results for input(s): LIPASE, AMYLASE in the last 168 hours.  Recent Labs Lab 12/11/15 1831 12/12/15 0050  AMMONIA 51* 26   CBC:  Recent Labs Lab 12/11/15 1831 12/12/15 0050 12/13/15 0300  WBC 9.5 11.4* 5.6  NEUTROABS 8.6* 10.1*  --   HGB 12.2 10.1* 9.6*  HCT 40.4 32.8* 32.1*  MCV 96.4 96.5 97.0  PLT 122* 109* 98*   Cardiac Enzymes:  Recent Labs Lab 12/12/15 0053 12/12/15 0541 12/12/15 1043  TROPONINI 0.04* 0.04* 0.04*   BNP (last 3 results)  Recent Labs  09/22/15 1513 09/25/15 0440 12/11/15 1831  BNP 173.0* 131.7* 155.3*    ProBNP (last 3 results) No results for input(s): PROBNP in the last 8760 hours.  CBG:  Recent Labs Lab 12/13/15 1138 12/13/15 1632 12/13/15 2122 12/14/15 0611 12/14/15 1134  GLUCAP 199* 236* 224* 150* 123*    Recent Results (from the past 240 hour(s))  Urine culture     Status: None   Collection Time: 12/11/15  5:44 PM  Result Value Ref Range Status   Specimen Description URINE, CATHETERIZED  Final   Special Requests NONE  Final   Culture NO GROWTH  Final   Report Status 12/12/2015 FINAL  Final  Culture, blood (Routine x 2)     Status: Abnormal (Preliminary result)   Collection Time: 12/11/15  6:32 PM  Result Value Ref Range Status   Specimen Description BLOOD LEFT HAND  Final   Special Requests BOTTLES DRAWN AEROBIC AND ANAEROBIC 5CC   Final   Culture  Setup Time   Final    GRAM POSITIVE COCCI IN CLUSTERS AEROBIC BOTTLE ONLY CRITICAL RESULT CALLED TO, READ BACK BY AND VERIFIED WITH: TO VBRYK(PHARD) BY TCLEVELAND 12/13/2015 AT 2:57AM    Culture STAPHYLOCOCCUS SPECIES (COAGULASE NEGATIVE) (A)  Final    Report Status PENDING  Incomplete  Blood Culture ID Panel (Reflexed)     Status: Abnormal   Collection Time: 12/11/15  6:32 PM  Result Value Ref Range Status   Enterococcus species NOT DETECTED NOT DETECTED Final   Vancomycin resistance NOT DETECTED NOT DETECTED Final   Listeria monocytogenes NOT DETECTED NOT DETECTED Final   Staphylococcus species DETECTED (A) NOT DETECTED Final    Comment: CRITICAL RESULT CALLED TO, READ BACK BY AND VERIFIED WITH: TO VBYRYK(PHARD) BY TCLEVELAND 12/13/2015 AT 1:09AM    Staphylococcus aureus NOT DETECTED NOT DETECTED Final   Methicillin resistance DETECTED (A)  NOT DETECTED Final    Comment: CRITICAL RESULT CALLED TO, READ BACK BY AND VERIFIED WITH: TO VBYRYK(PHARD) BY TCLEVELAND 12/13/2015 AT 1:08AM    Streptococcus species NOT DETECTED NOT DETECTED Final   Streptococcus agalactiae NOT DETECTED NOT DETECTED Final   Streptococcus pneumoniae NOT DETECTED NOT DETECTED Final   Streptococcus pyogenes NOT DETECTED NOT DETECTED Final   Acinetobacter baumannii NOT DETECTED NOT DETECTED Final   Enterobacteriaceae species NOT DETECTED NOT DETECTED Final   Enterobacter cloacae complex NOT DETECTED NOT DETECTED Final   Escherichia coli NOT DETECTED NOT DETECTED Final   Klebsiella oxytoca NOT DETECTED NOT DETECTED Final   Klebsiella pneumoniae NOT DETECTED NOT DETECTED Final   Proteus species NOT DETECTED NOT DETECTED Final   Serratia marcescens NOT DETECTED NOT DETECTED Final   Carbapenem resistance NOT DETECTED NOT DETECTED Final   Haemophilus influenzae NOT DETECTED NOT DETECTED Final   Neisseria meningitidis NOT DETECTED NOT DETECTED Final   Pseudomonas aeruginosa NOT DETECTED NOT DETECTED Final   Candida albicans NOT DETECTED NOT DETECTED Final   Candida glabrata NOT DETECTED NOT DETECTED Final   Candida krusei NOT DETECTED NOT DETECTED Final   Candida parapsilosis NOT DETECTED NOT DETECTED Final   Candida tropicalis NOT DETECTED NOT DETECTED Final   Culture, blood (Routine x 2)     Status: None (Preliminary result)   Collection Time: 12/11/15  7:30 PM  Result Value Ref Range Status   Specimen Description BLOOD RIGHT HAND  Final   Special Requests IN PEDIATRIC BOTTLE 2CC  Final   Culture NO GROWTH 2 DAYS  Final   Report Status PENDING  Incomplete     Studies: No results found.  Scheduled Meds: . apixaban  5 mg Oral BID  . carvedilol  3.125 mg Oral BID WC  . ferrous sulfate  325 mg Oral Q breakfast  . guaiFENesin  1,200 mg Oral BID  . insulin aspart  0-9 Units Subcutaneous TID WC  . insulin glargine  30 Units Subcutaneous QHS  . levETIRAcetam  500 mg Oral BID  . pantoprazole  40 mg Oral BID AC  . polyethylene glycol  17 g Oral Daily  . potassium chloride SA  20 mEq Oral Daily  . pregabalin  75 mg Oral BID  . rOPINIRole  2 mg Oral TID  . vancomycin  1,500 mg Intravenous Q24H   Continuous Infusions:      Time spent: 25 min    [facility] Pager [phone]. If 7PM-7AM, please contact night-coverage at [email or web address], Office  [phone]  password [email or web address] 12/14/2015, 2:12 PM  LOS: 2 days

## 2015-12-15 LAB — GLUCOSE, CAPILLARY
GLUCOSE-CAPILLARY: 189 mg/dL — AB (ref 65–99)
GLUCOSE-CAPILLARY: 189 mg/dL — AB (ref 65–99)
GLUCOSE-CAPILLARY: 175 mg/dL — AB (ref 65–99)
GLUCOSE-CAPILLARY: 243 mg/dL — AB (ref 65–99)
GLUCOSE-CAPILLARY: 177 mg/dL — AB (ref 65–99)

## 2015-12-15 LAB — CULTURE, BLOOD (ROUTINE X 2)

## 2015-12-15 MED ORDER — FUROSEMIDE 40 MG PO TABS
40.0000 mg | ORAL_TABLET | Freq: Two times a day (BID) | ORAL | Status: DC
  Administered 2015-12-15 – 2015-12-17 (×4): 40 mg via ORAL
  Filled 2015-12-15 (×4): qty 1

## 2015-12-15 NOTE — Progress Notes (Addendum)
[facility]  PROGRESS NOTE  Elizabeth Pugh [id number] DOB: [birth year] DOA: 12/11/2015 PCP: Philis Fendt, MD    Brief HPI:   82 y.o. female with atrial fibrillation, diastolic CHF, diabetes mellitus type 2 and possible OSA was brought to the ER after patient had sudden onset of fever and chills last evening around 4 PM which was followed by confusion and 3 episodes of nausea vomiting. Patient also had complained of some left-sided chest pain around the left breast. EMS was called and patient was brought to the ER. Patient was tachycardic and blood pressure was low normal. Patient had a fever of 103F in the ER. Abdomen appears benign. Chest x-ray and UA were unremarkable. As per the patient's daughter patient had been treated for UTI 2 weeks ago with Bactrim. Patient's lactic acid has been elevated for which patient was given fluid bolus. Patient has been empirically started on antibiotics for possible developing sepsis with unclear source     Assessment/Plan:      1. Sepsis due to cellulitis- resolving, continue with Vancomycin. Blood culture 1/2 bottles growing coagulase negative staph.Continue with IV Vancomycin for now,  discontinued Levaquin.-  Abdominal ultrasound is normal. CXR negative 2. Acute encephalopathy  resolved. Ammonia level is 26 . CT head was unremarkable. 3. Elevated troponin- mild elevation of troponin, likely from demand ischemia. 4. Chronic A. Fib - on Apixaban and Coreg. Chads 2 vasc score is 8. 5. Diastolic CHF with last EF measured in September 2016 was 55-60% -Lasix was on hold, will restart Lasix as the blood pressure has improved. 6. Diabetes mellitus type 2 - continue Lantus insulin with sliding scale coverage. Hold oral hypoglycemics. 7. Seizures - on Keppra.  8. Morbid obesity with possible OSA - as per patient daughter patient is in the process of getting a sleep study scheduled. 9. Chronically bed bound status-  secondary to inoperable left lower  extremity fracture  DVT prophylaxis: Apixaban Code Status: DNR Family Communication: No family at bedside Disposition Plan: Home once medically stable   Consultants:  None   Procedures:  None  Antibiotics:  Vancomycin  Azactam  Levaquin  Subjective   Patient seen and examined,. She denies nausea or vomiting. Has been afebrile.  Objective    Objective: Vitals:   12/14/15 1147 12/14/15 2040 12/15/15 0617 12/15/15 1144  BP: 135/65 132/72 134/72 119/71  Pulse: 81 74 92 86  Resp: 18 16 18    Temp: 97.9 F (36.6 C) 98.9 F (37.2 C) 98.2 F (36.8 C) 97.7 F (36.5 C)  TempSrc: Oral Oral Oral Oral  SpO2: 98% 99% 96% 98%  Weight:      Height:        Intake/Output Summary (Last 24 hours) at 12/15/15 1351 Last data filed at 12/15/15 0943  Gross per 24 hour  Intake             2460 ml  Output                0 ml  Net             2460 ml   Filed Weights   12/12/15 0429 12/13/15 0437 12/14/15 0601  Weight: (!) 137.2 kg (302 lb 8 oz) (!) 140.6 kg (310 lb) (!) 143.2 kg (315 lb 9.6 oz)    Examination:  General exam: Appears calm and comfortable  Respiratory system: Clear to auscultation. Respiratory effort normal. Cardiovascular system: S1 & S2 heard, RRR. No JVD, murmurs, rubs, gallops or clicks. Trace  pedal edema bilaterally Gastrointestinal system: Abdomen is nondistended, soft and nontender. No organomegaly or masses felt. Normal bowel sounds heard. Central nervous system: Alert and oriented. No focal neurological deficits. Extremities: Symmetric 5 x 5 power. Skin: LLE is Less warm warm to touchtoday, mild erythema. Psychiatry: Judgement and insight appear normal. Mood & affect appropriate.    Data Reviewed: I have personally reviewed following labs and imaging studies Basic Metabolic Panel:  Recent Labs Lab 12/11/15 1831 12/12/15 0050 12/13/15 0300 12/14/15 0436  NA 136 138 137 138  K 4.6 4.4 4.3 4.4  CL 96* 98* 98* 99*  CO2 32 35* 32 33*   GLUCOSE 194* 229* 144* 168*  BUN 15 18 20 18   CREATININE 0.92 1.02* 1.10* 0.95  CALCIUM 9.0 8.2* 8.2* 8.4*   Liver Function Tests:  Recent Labs Lab 12/11/15 1831 12/12/15 0050 12/13/15 0300  AST 22 19 15   ALT 14 10* 9*  ALKPHOS 61 46 47  BILITOT 1.2 1.3* 0.9  PROT 6.7 5.5* 5.5*  ALBUMIN 3.4* 2.6* 2.5*   No results for input(s): LIPASE, AMYLASE in the last 168 hours.  Recent Labs Lab 12/11/15 1831 12/12/15 0050  AMMONIA 51* 26   CBC:  Recent Labs Lab 12/11/15 1831 12/12/15 0050 12/13/15 0300  WBC 9.5 11.4* 5.6  NEUTROABS 8.6* 10.1*  --   HGB 12.2 10.1* 9.6*  HCT 40.4 32.8* 32.1*  MCV 96.4 96.5 97.0  PLT 122* 109* 98*   Cardiac Enzymes:  Recent Labs Lab 12/12/15 0053 12/12/15 0541 12/12/15 1043  TROPONINI 0.04* 0.04* 0.04*   BNP (last 3 results)  Recent Labs  09/22/15 1513 09/25/15 0440 12/11/15 1831  BNP 173.0* 131.7* 155.3*    ProBNP (last 3 results) No results for input(s): PROBNP in the last 8760 hours.  CBG:  Recent Labs Lab 12/14/15 1802 12/14/15 2036 12/15/15 0616 12/15/15 1122 12/15/15 1347  GLUCAP 165* 148* 189* 189* 175*    Recent Results (from the past 240 hour(s))  Urine culture     Status: None   Collection Time: 12/11/15  5:44 PM  Result Value Ref Range Status   Specimen Description URINE, CATHETERIZED  Final   Special Requests NONE  Final   Culture NO GROWTH  Final   Report Status 12/12/2015 FINAL  Final  Culture, blood (Routine x 2)     Status: Abnormal   Collection Time: 12/11/15  6:32 PM  Result Value Ref Range Status   Specimen Description BLOOD LEFT HAND  Final   Special Requests BOTTLES DRAWN AEROBIC AND ANAEROBIC 5CC   Final   Culture  Setup Time   Final    GRAM POSITIVE COCCI IN CLUSTERS AEROBIC BOTTLE ONLY CRITICAL RESULT CALLED TO, READ BACK BY AND VERIFIED WITH: TO VBRYK(PHARD) BY TCLEVELAND 12/13/2015 AT 2:57AM    Culture (A)  Final    STAPHYLOCOCCUS SPECIES (COAGULASE NEGATIVE) THE SIGNIFICANCE  OF ISOLATING THIS ORGANISM FROM A SINGLE SET OF BLOOD CULTURES WHEN MULTIPLE SETS ARE DRAWN IS UNCERTAIN. PLEASE NOTIFY THE MICROBIOLOGY DEPARTMENT WITHIN ONE WEEK IF SPECIATION AND SENSITIVITIES ARE REQUIRED.    Report Status 12/15/2015 FINAL  Final  Blood Culture ID Panel (Reflexed)     Status: Abnormal   Collection Time: 12/11/15  6:32 PM  Result Value Ref Range Status   Enterococcus species NOT DETECTED NOT DETECTED Final   Vancomycin resistance NOT DETECTED NOT DETECTED Final   Listeria monocytogenes NOT DETECTED NOT DETECTED Final   Staphylococcus species DETECTED (A) NOT DETECTED Final  Comment: CRITICAL RESULT CALLED TO, READ BACK BY AND VERIFIED WITH: TO VBYRYK(PHARD) BY TCLEVELAND 12/13/2015 AT 1:09AM    Staphylococcus aureus NOT DETECTED NOT DETECTED Final   Methicillin resistance DETECTED (A) NOT DETECTED Final    Comment: CRITICAL RESULT CALLED TO, READ BACK BY AND VERIFIED WITH: TO VBYRYK(PHARD) BY TCLEVELAND 12/13/2015 AT 1:08AM    Streptococcus species NOT DETECTED NOT DETECTED Final   Streptococcus agalactiae NOT DETECTED NOT DETECTED Final   Streptococcus pneumoniae NOT DETECTED NOT DETECTED Final   Streptococcus pyogenes NOT DETECTED NOT DETECTED Final   Acinetobacter baumannii NOT DETECTED NOT DETECTED Final   Enterobacteriaceae species NOT DETECTED NOT DETECTED Final   Enterobacter cloacae complex NOT DETECTED NOT DETECTED Final   Escherichia coli NOT DETECTED NOT DETECTED Final   Klebsiella oxytoca NOT DETECTED NOT DETECTED Final   Klebsiella pneumoniae NOT DETECTED NOT DETECTED Final   Proteus species NOT DETECTED NOT DETECTED Final   Serratia marcescens NOT DETECTED NOT DETECTED Final   Carbapenem resistance NOT DETECTED NOT DETECTED Final   Haemophilus influenzae NOT DETECTED NOT DETECTED Final   Neisseria meningitidis NOT DETECTED NOT DETECTED Final   Pseudomonas aeruginosa NOT DETECTED NOT DETECTED Final   Candida albicans NOT DETECTED NOT DETECTED Final    Candida glabrata NOT DETECTED NOT DETECTED Final   Candida krusei NOT DETECTED NOT DETECTED Final   Candida parapsilosis NOT DETECTED NOT DETECTED Final   Candida tropicalis NOT DETECTED NOT DETECTED Final  Culture, blood (Routine x 2)     Status: None (Preliminary result)   Collection Time: 12/11/15  7:30 PM  Result Value Ref Range Status   Specimen Description BLOOD RIGHT HAND  Final   Special Requests IN PEDIATRIC BOTTLE 2CC  Final   Culture NO GROWTH 4 DAYS  Final   Report Status PENDING  Incomplete     Studies: No results found.  Scheduled Meds: . apixaban  5 mg Oral BID  . carvedilol  3.125 mg Oral BID WC  . ferrous sulfate  325 mg Oral Q breakfast  . guaiFENesin  1,200 mg Oral BID  . insulin aspart  0-9 Units Subcutaneous TID WC  . insulin glargine  30 Units Subcutaneous QHS  . levETIRAcetam  500 mg Oral BID  . pantoprazole  40 mg Oral BID AC  . polyethylene glycol  17 g Oral Daily  . potassium chloride SA  20 mEq Oral Daily  . pregabalin  75 mg Oral BID  . rOPINIRole  2 mg Oral TID  . vancomycin  1,500 mg Intravenous Q24H   Continuous Infusions:      Time spent: 25 min    [facility] Pager [phone]. If 7PM-7AM, please contact night-coverage at [email or web address], Office  [phone]  password [email or web address] 12/15/2015, 1:51 PM  LOS: 3 days

## 2015-12-16 DIAGNOSIS — E1169 Type 2 diabetes mellitus with other specified complication: Secondary | ICD-10-CM

## 2015-12-16 DIAGNOSIS — E118 Type 2 diabetes mellitus with unspecified complications: Secondary | ICD-10-CM

## 2015-12-16 DIAGNOSIS — L0390 Cellulitis, unspecified: Secondary | ICD-10-CM

## 2015-12-16 DIAGNOSIS — I10 Essential (primary) hypertension: Secondary | ICD-10-CM

## 2015-12-16 DIAGNOSIS — E1165 Type 2 diabetes mellitus with hyperglycemia: Secondary | ICD-10-CM

## 2015-12-16 DIAGNOSIS — I5032 Chronic diastolic (congestive) heart failure: Secondary | ICD-10-CM

## 2015-12-16 DIAGNOSIS — Z794 Long term (current) use of insulin: Secondary | ICD-10-CM

## 2015-12-16 DIAGNOSIS — L03116 Cellulitis of left lower limb: Secondary | ICD-10-CM

## 2015-12-16 LAB — GLUCOSE, CAPILLARY
GLUCOSE-CAPILLARY: 148 mg/dL — AB (ref 65–99)
GLUCOSE-CAPILLARY: 203 mg/dL — AB (ref 65–99)
GLUCOSE-CAPILLARY: 214 mg/dL — AB (ref 65–99)
Glucose-Capillary: 161 mg/dL — ABNORMAL HIGH (ref 65–99)

## 2015-12-16 LAB — VANCOMYCIN, TROUGH: Vancomycin Tr: 22 ug/mL (ref 15–20)

## 2015-12-16 LAB — BASIC METABOLIC PANEL
Anion gap: 5 (ref 5–15)
BUN: 17 mg/dL (ref 6–20)
CHLORIDE: 103 mmol/L (ref 101–111)
CO2: 30 mmol/L (ref 22–32)
CREATININE: 0.96 mg/dL (ref 0.44–1.00)
Calcium: 8.2 mg/dL — ABNORMAL LOW (ref 8.9–10.3)
GFR calc Af Amer: 60 mL/min — ABNORMAL LOW (ref >60)
GFR calc non Af Amer: 52 mL/min — ABNORMAL LOW (ref >60)
GLUCOSE: 195 mg/dL — AB (ref 65–99)
POTASSIUM: 4.4 mmol/L (ref 3.5–5.1)
SODIUM: 138 mmol/L (ref 135–145)

## 2015-12-16 LAB — CULTURE, BLOOD (ROUTINE X 2): CULTURE: NO GROWTH

## 2015-12-16 MED ORDER — VANCOMYCIN HCL 10 G IV SOLR
1500.0000 mg | INTRAVENOUS | Status: DC
  Administered 2015-12-16: 1500 mg via INTRAVENOUS
  Filled 2015-12-16: qty 1500

## 2015-12-16 MED ORDER — CARVEDILOL 3.125 MG PO TABS
3.1250 mg | ORAL_TABLET | Freq: Two times a day (BID) | ORAL | Status: DC
  Administered 2015-12-16 – 2015-12-17 (×3): 3.125 mg via ORAL
  Filled 2015-12-16 (×3): qty 1

## 2015-12-16 MED ORDER — FERROUS SULFATE 325 (65 FE) MG PO TABS
325.0000 mg | ORAL_TABLET | Freq: Every day | ORAL | Status: DC
  Administered 2015-12-16 – 2015-12-17 (×2): 325 mg via ORAL
  Filled 2015-12-16 (×2): qty 1

## 2015-12-16 MED ORDER — SULFAMETHOXAZOLE-TRIMETHOPRIM 800-160 MG PO TABS
1.0000 | ORAL_TABLET | Freq: Two times a day (BID) | ORAL | Status: DC
  Administered 2015-12-16 – 2015-12-17 (×2): 1 via ORAL
  Filled 2015-12-16 (×2): qty 1

## 2015-12-16 NOTE — Progress Notes (Signed)
Pharmacy Antibiotic Note  Elizabeth Pugh is a 82 y.o. female admitted on 12/11/2015 with sepsis due to cellulitis.  Pt continues on vancomycin (Day #6).  Vanc trough 22 mcg/ml (SUPRAtherapeutic) - this trough was drawn ~5 hours late and 8/12 dose was missed so true trough would be higher.  Plan: - Change Vancomycin to 1500mg  IV Q48H - F/u renal fxn, C&S, clinical status and trough at [facility] on new dose  Height: 5\' 7"  (170.2 cm) Weight: (!) 315 lb 9.6 oz (143.2 kg) IBW/kg (Calculated) : 61.6  Temp (24hrs), Avg:98.1 F (36.7 C), Min:97.7 F (36.5 C), Max:98.3 F (36.8 C)   Recent Labs Lab 12/11/15 1831 12/11/15 1846 12/11/15 2035 12/12/15 0050 12/12/15 0319 12/13/15 0300 12/14/15 0436 12/16/15 0043  WBC 9.5  --   --  11.4*  --  5.6  --   --   CREATININE 0.92  --   --  1.02*  --  1.10* 0.95  --   LATICACIDVEN  --  2.43* 2.13* 2.4* 1.7  --   --   --   VANCOTROUGH  --   --   --   --   --   --   --  22*    Estimated Creatinine Clearance: 62 mL/min (by C-G formula based on SCr of 0.95 mg/dL).    Allergies  Allergen Reactions  . Erythromycin Other (See Comments)    Vaginal itching  . Penicillins Rash    Has patient had a PCN reaction causing immediate rash, facial/tongue/throat swelling, SOB or lightheadedness with hypotension: No Has patient had a PCN reaction causing severe rash involving mucus membranes or skin necrosis: No Has patient had a PCN reaction that required hospitalization: No Has patient had a PCN reaction occurring within the last 10 years: No If all of the above answers are "NO", then may proceed with Cephalosporin use.  Marland Kitchen Zithromax [Azithromycin] Other (See Comments)    gave her a yeast infection.    Antimicrobials this admission: Vanc 8/9>> Cefepime 8/9 x1 Levaquin 8/10 x1 Aztreonam 8/9>>8/10  Dose adjustments this admission: N/A  Microbiology results: 8/9 BCx x2: 1/2 CONS (?contaminant) 8/9 UCx: negative  Thank you for allowing pharmacy to be a  part of this patient's care.  Sherlon Handing, PharmD, BCPS Clinical pharmacist, pager [phone] 12/16/2015 1:39 AM

## 2015-12-16 NOTE — Progress Notes (Signed)
PT consult completed and Dr. Darrick Meigs notified via telephone that consult completed and that patient is at her baseline per PT assessment.

## 2015-12-16 NOTE — Evaluation (Addendum)
Physical Therapy Evaluation and Discharge Patient Details Name: Elizabeth Pugh MRN: [id number] DOB: [birth year] Today's Date: 12/16/2015   History of Present Illness  82 y.o. female with atrial fibrillation, diastolic CHF, diabetes mellitus type 2 and possible OSA was brought to the ER after patient had sudden onset of fever and chills last evening around 4 PM which was followed by confusion. UA and CXR unremarkable; +cellulitis    Clinical Impression  Patient evaluated by Physical Therapy with no further PT needs identified. Patient is at her baseline (she has been non-ambulatory x 2 years; uses hoyer lift when she gets out of bed (not daily)). She has been to SNF for therapy and the focus was on building her arm strength to improve her bed mobility. She demonstrates the exercises she learned and continues to do (per her report).   She wants PT to assist her with learning to stand and pivot again. We had a frank discussion re: her inability to bend her knees precluding her ability to stand from bed or BSC (she cannot get her feet under her to come to stand). I do not believe she can regain at least 90 degrees of knee flexion in either knee to make a stand-pivot goal appropriate.  She was disappointed by this conclusion, but accepting that "the spirit may be willing, but the body is not able." We also discussed sliding board transfers and posterior transfers and she states she has tried each and is unable to assist her caregivers enough, thus they use a hoyer lift. All education has been completed and the patient has no further questions. She is in agreement with discharge home, as PTA.  PT is signing off. Thank you for this referral.     Follow Up Recommendations No PT follow up;Supervision for mobility/OOB  Anticipate medical transport home as she cannot do car transfer.    Equipment Recommendations  None recommended by PT    Recommendations for Other Services       Precautions /  Restrictions Precautions Precautions: Fall      Mobility  Bed Mobility Overal bed mobility: Needs Assistance;+2 for physical assistance Bed Mobility: Rolling Rolling: Max assist;Total assist;+2 for physical assistance         General bed mobility comments: roll to left with max assist and to right with total assist with use of pad at hips; pt does pull with her arms once she is at a point where she can reach rail; she can maintain sidelying once assisted)  Transfers                 General transfer comment: patient has used hoyer lift for dependent transfer for years  Ambulation/Gait                Stairs            Wheelchair Mobility    Modified Rankin (Stroke Patients Only)       Balance Overall balance assessment:  (unable to attempt EOB to assess)                                           Pertinent Vitals/Pain Pain Assessment: No/denies pain    Home Living Family/patient expects to be discharged to:: Private residence Living Arrangements: Children;Other relatives (grandson) Available Help at Discharge: Family;Available 24 hours/day Type of Home: House Home Access: Ramped entrance  Home Layout: One level Home Equipment: Hospital bed;Transport chair;Electric scooter;Bedside commode;Other (comment) (hoyer lift, trapeze, recliner (?lift), sliding board)      Prior Function Level of Independence: Needs assistance   Gait / Transfers Assistance Needed: bed bound except when grandson present and can use hoyer lift for OOB. Has been non-ambulatory x 2 yrs per pt  ADL's / Homemaking Assistance Needed: Total assist for bathing, dressing, toileting (per pt, she wears a diaper and daughter changes her)        Hand Dominance   Dominant Hand: Right    Extremity/Trunk Assessment   Upper Extremity Assessment: Generalized weakness;RUE deficits/detail;LUE deficits/detail RUE Deficits / Details: limited shoulder ROM; elbow,  wrist, hand ROM WFL; elbow flexion 4/5, extension 3+     LUE Deficits / Details: limited shoulder ROM; elbow, wrist, hand ROM WFL; elbow flexion 4/5, extension 3+   Lower Extremity Assessment: RLE deficits/detail;LLE deficits/detail RLE Deficits / Details: in sidelying, only able to flex knee 0-20 degrees; hip flexion in supine 2+ (also limited by body habitus, lack of knee flexion); PROM at hip to 70 (with tissue opposition between thigh and abdomen) LLE Deficits / Details: in sidelying, only able to flex knee 0-10 degrees; hip flexion in supine 2+ (also limited by body habitus, lack of knee flexion); PROM at hip to 70 (with tissue opposition between thigh and abdomen)  Cervical / Trunk Assessment: Other exceptions  Communication   Communication: No difficulties  Cognition Arousal/Alertness: Awake/alert Behavior During Therapy: WFL for tasks assessed/performed Overall Cognitive Status: Within Functional Limits for tasks assessed                      General Comments      Exercises Other Exercises Other Exercises: Pt demonstrated multiple UE exercises she does to maintain arm strength. She demonstrated ankle pumps, hip abduction/adduction, and heel slides in supine.      Assessment/Plan    PT Assessment Patent does not need any further PT services  PT Diagnosis Generalized weakness   PT Problem List    PT Treatment Interventions     PT Goals (Current goals can be found in the Care Plan section) Acute Rehab PT Goals Patient Stated Goal: She wants to walk again PT Goal Formulation: All assessment and education complete, DC therapy    Frequency     Barriers to discharge        Co-evaluation               End of Session Equipment Utilized During Treatment: Other (comment) (bed pad) Activity Tolerance: Patient tolerated treatment well Patient left: in bed;with call bell/phone within reach;with nursing/sitter in room Nurse Communication: Mobility status;Need  for lift equipment;Other (comment) (discharge plan should be for home)         Time: [id number] PT Time Calculation (min) (ACUTE ONLY): 44 min   Charges:   PT Evaluation $PT Eval Low Complexity: 1 Procedure PT Treatments $Therapeutic Exercise: 8-22 mins $Therapeutic Activity: 8-22 mins   PT G Codes:        Aaryan Essman 20-Dec-2015, 4:55 PM  Pager [phone]

## 2015-12-16 NOTE — Care Management Important Message (Signed)
Important Message  Patient Details  Name: LYLIAN TYMINSKI MRN: [id number] Date of Birth: 1926/06/30   Medicare Important Message Given:  Yes    Marilouise Densmore Montine Circle 12/16/2015, 11:27 AM

## 2015-12-16 NOTE — Progress Notes (Signed)
Dr. Darrick Meigs confirmed that patient will be discharged home tomorrow.  Patient informed of update to plan.

## 2015-12-16 NOTE — Progress Notes (Addendum)
[facility]  PROGRESS NOTE  Elizabeth Pugh [id number] DOB: December 22, 1926 DOA: 12/11/2015 PCP: Philis Fendt, MD    Brief HPI:   82 y.o. female with atrial fibrillation, diastolic CHF, diabetes mellitus type 2 and possible OSA was brought to the ER after patient had sudden onset of fever and chills last evening around 4 PM which was followed by confusion and 3 episodes of nausea vomiting. Patient also had complained of some left-sided chest pain around the left breast. EMS was called and patient was brought to the ER. Patient was tachycardic and blood pressure was low normal. Patient had a fever of 103F in the ER. Abdomen appears benign. Chest x-ray and UA were unremarkable. As per the patient's daughter patient had been treated for UTI 2 weeks ago with Bactrim. Patient's lactic acid has been elevated for which patient was given fluid bolus. Patient has been empirically started on antibiotics for possible developing sepsis with unclear source     Assessment/Plan:      1. Sepsis due to cellulitis- resolved, was started on IV Vancomycin. Blood culture 1/2 bottles growing coagulase negative staph. Discontinued Levaquin.-  Abdominal ultrasound is normal. CXR negative. Will discontinue Vancomycin and start Bactrim 2. Acute encephalopathy  resolved. Ammonia level is 26 . CT head was unremarkable. 3. Elevated troponin- mild elevation of troponin, likely from demand ischemia. 4. Chronic A. Fib - on Apixaban and Coreg. Chads 2 vasc score is 8. 5. Diastolic CHF with last EF measured in September 2016 was 55-60% -Lasix was on hold, will restart Lasix as the blood pressure has improved. 6. Diabetes mellitus type 2 - continue Lantus insulin with sliding scale coverage. Hold oral hypoglycemics. 7. Seizures - on Keppra.  8. Morbid obesity with possible OSA - as per patient daughter patient is in the process of getting a sleep study scheduled. 9. Chronically bed bound status-  secondary to  inoperable left lower extremity fracture  DVT prophylaxis: Apixaban Code Status: DNR Family Communication: No family at bedside Disposition Plan: Home once medically stable   Consultants:  None   Procedures:  None  Antibiotics:  Vancomycin   Subjective   Patient seen and examined,. She denies nausea or vomiting. Has been afebrile. Wants to go to rehab.  Objective    Objective: Vitals:   12/15/15 1933 12/16/15 0300 12/16/15 0357 12/16/15 1252  BP: 120/63  119/75 138/68  Pulse: 90  84 95  Resp: 18  20 18   Temp: 98.3 F (36.8 C)  97.7 F (36.5 C) 97.6 F (36.4 C)  TempSrc: Oral  Oral Oral  SpO2: 99%  98% 99%  Weight:  (!) 141.8 kg (312 lb 11.2 oz) (!) 141.8 kg (312 lb 9.8 oz)   Height:        Intake/Output Summary (Last 24 hours) at 12/16/15 1414 Last data filed at 12/16/15 1300  Gross per 24 hour  Intake             2000 ml  Output                0 ml  Net             2000 ml   Filed Weights   12/14/15 0601 12/16/15 0300 12/16/15 0357  Weight: (!) 143.2 kg (315 lb 9.6 oz) (!) 141.8 kg (312 lb 11.2 oz) (!) 141.8 kg (312 lb 9.8 oz)    Examination:  General exam: Appears calm and comfortable  Respiratory system: Clear to auscultation. Respiratory effort normal.  Cardiovascular system: S1 & S2 heard, RRR. No JVD, murmurs, rubs, gallops or clicks. Trace pedal edema bilaterally Gastrointestinal system: Abdomen is nondistended, soft and nontender. No organomegaly or masses felt. Normal bowel sounds heard. Central nervous system: Alert and oriented. No focal neurological deficits. Extremities: Symmetric 5 x 5 power. Skin: LLE is Less warm warm to touch, mild erythema. Psychiatry: Judgement and insight appear normal. Mood & affect appropriate.    Data Reviewed: I have personally reviewed following labs and imaging studies Basic Metabolic Panel:  Recent Labs Lab 12/11/15 1831 12/12/15 0050 12/13/15 0300 12/14/15 0436 12/16/15 0042  NA 136 138 137 138  138  K 4.6 4.4 4.3 4.4 4.4  CL 96* 98* 98* 99* 103  CO2 32 35* 32 33* 30  GLUCOSE 194* 229* 144* 168* 195*  BUN 15 18 20 18 17   CREATININE 0.92 1.02* 1.10* 0.95 0.96  CALCIUM 9.0 8.2* 8.2* 8.4* 8.2*   Liver Function Tests:  Recent Labs Lab 12/11/15 1831 12/12/15 0050 12/13/15 0300  AST 22 19 15   ALT 14 10* 9*  ALKPHOS 61 46 47  BILITOT 1.2 1.3* 0.9  PROT 6.7 5.5* 5.5*  ALBUMIN 3.4* 2.6* 2.5*   No results for input(s): LIPASE, AMYLASE in the last 168 hours.  Recent Labs Lab 12/11/15 1831 12/12/15 0050  AMMONIA 51* 26   CBC:  Recent Labs Lab 12/11/15 1831 12/12/15 0050 12/13/15 0300  WBC 9.5 11.4* 5.6  NEUTROABS 8.6* 10.1*  --   HGB 12.2 10.1* 9.6*  HCT 40.4 32.8* 32.1*  MCV 96.4 96.5 97.0  PLT 122* 109* 98*   Cardiac Enzymes:  Recent Labs Lab 12/12/15 0053 12/12/15 0541 12/12/15 1043  TROPONINI 0.04* 0.04* 0.04*   BNP (last 3 results)  Recent Labs  09/22/15 1513 09/25/15 0440 12/11/15 1831  BNP 173.0* 131.7* 155.3*    ProBNP (last 3 results) No results for input(s): PROBNP in the last 8760 hours.  CBG:  Recent Labs Lab 12/15/15 1347 12/15/15 1610 12/15/15 2145 12/16/15 0610 12/16/15 1119  GLUCAP 175* 243* 177* 161* 148*    Recent Results (from the past 240 hour(s))  Urine culture     Status: None   Collection Time: 12/11/15  5:44 PM  Result Value Ref Range Status   Specimen Description URINE, CATHETERIZED  Final   Special Requests NONE  Final   Culture NO GROWTH  Final   Report Status 12/12/2015 FINAL  Final  Culture, blood (Routine x 2)     Status: Abnormal   Collection Time: 12/11/15  6:32 PM  Result Value Ref Range Status   Specimen Description BLOOD LEFT HAND  Final   Special Requests BOTTLES DRAWN AEROBIC AND ANAEROBIC 5CC   Final   Culture  Setup Time   Final    GRAM POSITIVE COCCI IN CLUSTERS AEROBIC BOTTLE ONLY CRITICAL RESULT CALLED TO, READ BACK BY AND VERIFIED WITH: TO VBRYK(PHARD) BY TCLEVELAND 12/13/2015 AT  2:57AM    Culture (A)  Final    STAPHYLOCOCCUS SPECIES (COAGULASE NEGATIVE) THE SIGNIFICANCE OF ISOLATING THIS ORGANISM FROM A SINGLE SET OF BLOOD CULTURES WHEN MULTIPLE SETS ARE DRAWN IS UNCERTAIN. PLEASE NOTIFY THE MICROBIOLOGY DEPARTMENT WITHIN ONE WEEK IF SPECIATION AND SENSITIVITIES ARE REQUIRED.    Report Status 12/15/2015 FINAL  Final  Blood Culture ID Panel (Reflexed)     Status: Abnormal   Collection Time: 12/11/15  6:32 PM  Result Value Ref Range Status   Enterococcus species NOT DETECTED NOT DETECTED Final   Vancomycin resistance NOT  DETECTED NOT DETECTED Final   Listeria monocytogenes NOT DETECTED NOT DETECTED Final   Staphylococcus species DETECTED (A) NOT DETECTED Final    Comment: CRITICAL RESULT CALLED TO, READ BACK BY AND VERIFIED WITH: TO VBYRYK(PHARD) BY TCLEVELAND 12/13/2015 AT 1:09AM    Staphylococcus aureus NOT DETECTED NOT DETECTED Final   Methicillin resistance DETECTED (A) NOT DETECTED Final    Comment: CRITICAL RESULT CALLED TO, READ BACK BY AND VERIFIED WITH: TO VBYRYK(PHARD) BY TCLEVELAND 12/13/2015 AT 1:08AM    Streptococcus species NOT DETECTED NOT DETECTED Final   Streptococcus agalactiae NOT DETECTED NOT DETECTED Final   Streptococcus pneumoniae NOT DETECTED NOT DETECTED Final   Streptococcus pyogenes NOT DETECTED NOT DETECTED Final   Acinetobacter baumannii NOT DETECTED NOT DETECTED Final   Enterobacteriaceae species NOT DETECTED NOT DETECTED Final   Enterobacter cloacae complex NOT DETECTED NOT DETECTED Final   Escherichia coli NOT DETECTED NOT DETECTED Final   Klebsiella oxytoca NOT DETECTED NOT DETECTED Final   Klebsiella pneumoniae NOT DETECTED NOT DETECTED Final   Proteus species NOT DETECTED NOT DETECTED Final   Serratia marcescens NOT DETECTED NOT DETECTED Final   Carbapenem resistance NOT DETECTED NOT DETECTED Final   Haemophilus influenzae NOT DETECTED NOT DETECTED Final   Neisseria meningitidis NOT DETECTED NOT DETECTED Final   Pseudomonas  aeruginosa NOT DETECTED NOT DETECTED Final   Candida albicans NOT DETECTED NOT DETECTED Final   Candida glabrata NOT DETECTED NOT DETECTED Final   Candida krusei NOT DETECTED NOT DETECTED Final   Candida parapsilosis NOT DETECTED NOT DETECTED Final   Candida tropicalis NOT DETECTED NOT DETECTED Final  Culture, blood (Routine x 2)     Status: None (Preliminary result)   Collection Time: 12/11/15  7:30 PM  Result Value Ref Range Status   Specimen Description BLOOD RIGHT HAND  Final   Special Requests IN PEDIATRIC BOTTLE 2CC  Final   Culture NO GROWTH 4 DAYS  Final   Report Status PENDING  Incomplete     Studies: No results found.  Scheduled Meds: . apixaban  5 mg Oral BID  . carvedilol  3.125 mg Oral BID WC  . ferrous sulfate  325 mg Oral Q breakfast  . furosemide  40 mg Oral BID  . guaiFENesin  1,200 mg Oral BID  . insulin aspart  0-9 Units Subcutaneous TID WC  . insulin glargine  30 Units Subcutaneous QHS  . levETIRAcetam  500 mg Oral BID  . pantoprazole  40 mg Oral BID AC  . polyethylene glycol  17 g Oral Daily  . potassium chloride SA  20 mEq Oral Daily  . pregabalin  75 mg Oral BID  . rOPINIRole  2 mg Oral TID  . vancomycin  1,500 mg Intravenous Q48H   Continuous Infusions:      Time spent: 25 min    [facility] Pager [phone]. If 7PM-7AM, please contact night-coverage at [email or web address], Office  [phone]  password [email or web address] 12/16/2015, 2:14 PM  LOS: 4 days

## 2015-12-17 DIAGNOSIS — G40909 Epilepsy, unspecified, not intractable, without status epilepticus: Secondary | ICD-10-CM

## 2015-12-17 LAB — GLUCOSE, CAPILLARY
GLUCOSE-CAPILLARY: 173 mg/dL — AB (ref 65–99)
Glucose-Capillary: 179 mg/dL — ABNORMAL HIGH (ref 65–99)

## 2015-12-17 MED ORDER — SULFAMETHOXAZOLE-TRIMETHOPRIM 800-160 MG PO TABS: 1.0000 | ORAL_TABLET | Freq: Two times a day (BID) | ORAL | 0 refills | Status: DC

## 2015-12-17 NOTE — Progress Notes (Signed)
Reviewed d/c with patient. IV removed. No issues at present. Awaiting ambulance for discharge.   Ciarra Braddy, Mervin Kung RN

## 2015-12-17 NOTE — Discharge Instructions (Signed)

## 2015-12-17 NOTE — Discharge Summary (Signed)
Physician Discharge Summary  Elizabeth Pugh [id number] DOB: [birth year] DOA: 12/11/2015  PCP: Philis Fendt, MD  Admit date: 12/11/2015 Discharge date: 12/17/2015  Time spent: 25* minutes  Recommendations for Outpatient Follow-up:  1. Follow up PCP in 2 weeks   Discharge Diagnoses:  Principal Problem:   SIRS (systemic inflammatory response syndrome) (HCC) Active Problems:   DM (diabetes mellitus), type 2, uncontrolled with complications (HCC)   Hypertension   Seizure disorder (HCC)   Chronic diastolic heart failure (HCC)   Acute encephalopathy   Chronic respiratory failure with hypoxia and hypercapnia ([place])   Cellulitis   Discharge Condition: Stable  Diet recommendation: Low salt diet  Filed Weights   12/16/15 0300 12/16/15 0357 12/17/15 0500  Weight: (!) 141.8 kg (312 lb 11.2 oz) (!) 141.8 kg (312 lb 9.8 oz) (!) 143.3 kg (316 lb)    History of present illness:  82 y.o.femalewith atrial fibrillation, diastolic CHF, diabetes mellitus type 2and possible OSA was brought to the ER after patient had sudden onset of fever and chills last evening around 4 PM which was followed by confusion and 3 episodes of nausea vomiting. Patient also had complained of some left-sided chest pain around the left breast. EMS was called and patient was brought to the ER. Patient was tachycardic and blood pressure was low normal. Patient had a fever of 103F in the ER. Abdomen appears benign. Chest x-ray and UA were unremarkable. As per the patient's daughter patient had been treated for UTI 2 weeks ago with Bactrim. Patient's lactic acid has been elevated for which patient was given fluid bolus. Patient has been empirically started on antibiotics for possible developing sepsis with unclear source  Hospital Course:  1. Sepsis due to cellulitis- resolved, was started on IV Vancomycin. Blood culture 1/2 bottles growing coagulase negative staph. Discontinued Levaquin and Vancomycin.-  Abdominal  ultrasound is normal. CXR negative. Will discharge on 3 more days of Bactrim to complete 10 days of therapy. 2. Acute encephalopathy resolved. Ammonia level is 26 . CT head was unremarkable. 3. Elevated troponin- mild elevation of troponin, likely from demand ischemia. 4. Chronic A. Fib- on Apixaban and Coreg. Chads 2 vasc score is 8. 5. Diastolic CHFwith last EF measured in September 2016 was 55-60% - continue Lasix 40 mg po bid 6. Diabetes mellitus type 2- continue Lantus and Glimepride.  7. Seizures- on Keppra.  8. Morbid obesity with possible OSA- as per patient daughter patient is in the process of getting a sleep study scheduled. 9. Chronically bed bound status-  secondary to inoperable left lower extremity fracture. Patient was seen byPT, no rehab recommended, as she is at baseline.   Procedures:  None   Consultations:  none  Discharge Exam: Vitals:   12/17/15 0417 12/17/15 0953  BP: 127/78 120/70  Pulse: 74 88  Resp: 20   Temp: 98.5 F (36.9 C)     General: Appears in no acute distress Cardiovascular: S1S2 RRR Respiratory: Clear bilaterally  Discharge Instructions   Discharge Instructions    Diet - low sodium heart healthy    Complete by:  As directed   Increase activity slowly    Complete by:  As directed     Current Discharge Medication List    START taking these medications   Details  sulfamethoxazole-trimethoprim (BACTRIM DS,SEPTRA DS) 800-160 MG tablet Take 1 tablet by mouth every 12 (twelve) hours. Qty: 6 tablet, Refills: 0      CONTINUE these medications which have NOT CHANGED  Details  albuterol (PROVENTIL HFA;VENTOLIN HFA) 108 (90 BASE) MCG/ACT inhaler Inhale 1 puff into the lungs every 6 (six) hours as needed for wheezing or shortness of breath.    apixaban (ELIQUIS) 5 MG TABS tablet Take 1 tablet (5 mg total) by mouth 2 (two) times daily. Qty: 60 tablet, Refills: 0    calcium-vitamin D (CALCIUM 500+D) 500-400 MG-UNIT per tablet Take 1  tablet by mouth 2 (two) times daily.    carvedilol (COREG) 3.125 MG tablet Take 1 tablet (3.125 mg total) by mouth 2 (two) times daily with a meal. Qty: 60 tablet, Refills: 0    ferrous sulfate 325 (65 FE) MG tablet Take 325 mg by mouth daily with breakfast.    furosemide (LASIX) 40 MG tablet Take 40 mg by mouth 2 (two) times daily.    glimepiride (AMARYL) 2 MG tablet Take 2 mg by mouth daily before breakfast.    guaiFENesin (MUCINEX) 600 MG 12 hr tablet Take 2 tablets (1,200 mg total) by mouth 2 (two) times daily. Qty: 10 tablet, Refills: 0    insulin aspart (NOVOLOG) 100 UNIT/ML injection Inject 0-12 Units into the skin 3 (three) times daily before meals. Under 150 = 0 units, 150 - 200 = 2 units, 201- 250 = 4 units, 251- 300 = 6 units, 301- 350 = 8 units, 351-400 = 10 units, 401-450 = 12  units    insulin glargine (LANTUS) 100 UNIT/ML injection Inject 0.15 mLs (15 Units total) into the skin at bedtime. Qty: 10 mL, Refills: 0    ipratropium-albuterol (DUONEB) 0.5-2.5 (3) MG/3ML SOLN Take 3 mLs by nebulization every 6 (six) hours as needed. Qty: 360 mL, Refills: 0    levETIRAcetam (KEPPRA) 500 MG tablet Take 1 tablet (500 mg total) by mouth 2 (two) times daily. Qty: 30 tablet, Refills: 2    pantoprazole (PROTONIX) 40 MG tablet Take 1 tablet (40 mg total) by mouth 2 (two) times daily before a meal. Qty: 60 tablet, Refills: 0    polyethylene glycol (MIRALAX / GLYCOLAX) packet Take 17 g by mouth daily. Qty: 14 each, Refills: 0    potassium chloride SA (K-DUR,KLOR-CON) 20 MEQ tablet Take 1 tablet (20 mEq total) by mouth daily. Qty: 10 tablet, Refills: 0    pregabalin (LYRICA) 75 MG capsule Take 75 mg by mouth 2 (two) times daily.    rOPINIRole (REQUIP) 2 MG tablet Take 1 tablet (2 mg total) by mouth 3 (three) times daily. Qty: 30 tablet, Refills: 0    Vitamin D, Ergocalciferol, (DRISDOL) 50000 units CAPS capsule Take 50,000 Units by mouth every 7 (seven) days. No specific day     VOLTAREN 1 % GEL Apply 1 application topically 4 (four) times daily as needed (knee pain). For pain    ALPRAZolam (XANAX) 0.25 MG tablet Take 1 tablet (0.25 mg total) by mouth 2 (two) times daily as needed for anxiety. Qty: 30 tablet, Refills: 0       Allergies  Allergen Reactions  . Erythromycin Other (See Comments)    Vaginal itching  . Penicillins Rash    Has patient had a PCN reaction causing immediate rash, facial/tongue/throat swelling, SOB or lightheadedness with hypotension: No Has patient had a PCN reaction causing severe rash involving mucus membranes or skin necrosis: No Has patient had a PCN reaction that required hospitalization: No Has patient had a PCN reaction occurring within the last 10 years: No If all of the above answers are "NO", then may proceed with Cephalosporin use.  Marland Kitchen  Zithromax [Azithromycin] Other (See Comments)    gave her a yeast infection.      The results of significant diagnostics from this hospitalization (including imaging, microbiology, ancillary and laboratory) are listed below for reference.    Significant Diagnostic Studies: Dg Chest 2 View  Result Date: 12/11/2015 CLINICAL DATA:  Shortness of breath and tachycardia EXAM: CHEST  2 VIEW COMPARISON:  09/22/2015 FINDINGS: Cardiac shadow is mildly enlarged. Prominence of the central pulmonary vascularity is again noted likely related to some primary pulmonary hypertension. No focal confluent infiltrate is seen. The previously seen posterior fusion on the right is again noted but smaller. Bony structures are within normal limits. IMPRESSION: Decrease in right-sided pleural effusion. No acute infiltrate is noted. Electronically Signed   By: Inez Catalina M.D.   On: 12/11/2015 18:15   Ct Head Wo Contrast  Result Date: 12/11/2015 CLINICAL DATA:  Confusion EXAM: CT HEAD WITHOUT CONTRAST TECHNIQUE: Contiguous axial images were obtained from the base of the skull through the vertex without intravenous  contrast. COMPARISON:  09/26/2015 FINDINGS: Bony calvarium is intact. Atrophic changes are noted. No findings to suggest acute hemorrhage, acute infarction or space-occupying mass lesion are noted. IMPRESSION: Atrophic changes without acute abnormality. Electronically Signed   By: Inez Catalina M.D.   On: 12/11/2015 19:23   US Abdomen Complete  Result Date: 12/12/2015 CLINICAL DATA:  82 year old female inpatient with nausea and vomiting. EXAM: ABDOMEN ULTRASOUND COMPLETE COMPARISON:  07/07/2010 CT abdomen/pelvis. FINDINGS: Gallbladder: No gallstones or wall thickening visualized. No sonographic Murphy sign noted by sonographer. Common bile duct: Diameter: 5 mm Liver: Limited visualization of the liver due to patient related factors. No focal lesion identified. Within normal limits in parenchymal echogenicity. IVC: No abnormality visualized. Pancreas:  Limited visualized portion unremarkable. Spleen: Size and appearance within normal limits. Right Kidney: Length: 10.4 cm. Echogenicity within normal limits. No mass or hydronephrosis visualized. Left Kidney: Length: 11.0 cm. Echogenicity within normal limits. No mass or hydronephrosis visualized. Abdominal aorta: No aneurysm visualized. Other findings: None. IMPRESSION: No acute abnormality. No cholelithiasis. No biliary ductal dilatation . no hydronephrosis. Electronically Signed   By: Ilona Sorrel M.D.   On: 12/12/2015 09:34    Microbiology: Recent Results (from the past 240 hour(s))  Urine culture     Status: None   Collection Time: 12/11/15  5:44 PM  Result Value Ref Range Status   Specimen Description URINE, CATHETERIZED  Final   Special Requests NONE  Final   Culture NO GROWTH  Final   Report Status 12/12/2015 FINAL  Final  Culture, blood (Routine x 2)     Status: Abnormal   Collection Time: 12/11/15  6:32 PM  Result Value Ref Range Status   Specimen Description BLOOD LEFT HAND  Final   Special Requests BOTTLES DRAWN AEROBIC AND ANAEROBIC 5CC    Final   Culture  Setup Time   Final    GRAM POSITIVE COCCI IN CLUSTERS AEROBIC BOTTLE ONLY CRITICAL RESULT CALLED TO, READ BACK BY AND VERIFIED WITH: TO VBRYK(PHARD) BY TCLEVELAND 12/13/2015 AT 2:57AM    Culture (A)  Final    STAPHYLOCOCCUS SPECIES (COAGULASE NEGATIVE) THE SIGNIFICANCE OF ISOLATING THIS ORGANISM FROM A SINGLE SET OF BLOOD CULTURES WHEN MULTIPLE SETS ARE DRAWN IS UNCERTAIN. PLEASE NOTIFY THE MICROBIOLOGY DEPARTMENT WITHIN ONE WEEK IF SPECIATION AND SENSITIVITIES ARE REQUIRED.    Report Status 12/15/2015 FINAL  Final  Blood Culture ID Panel (Reflexed)     Status: Abnormal   Collection Time: 12/11/15  6:32 PM  Result Value Ref Range Status   Enterococcus species NOT DETECTED NOT DETECTED Final   Vancomycin resistance NOT DETECTED NOT DETECTED Final   Listeria monocytogenes NOT DETECTED NOT DETECTED Final   Staphylococcus species DETECTED (A) NOT DETECTED Final    Comment: CRITICAL RESULT CALLED TO, READ BACK BY AND VERIFIED WITH: TO VBYRYK(PHARD) BY TCLEVELAND 12/13/2015 AT 1:09AM    Staphylococcus aureus NOT DETECTED NOT DETECTED Final   Methicillin resistance DETECTED (A) NOT DETECTED Final    Comment: CRITICAL RESULT CALLED TO, READ BACK BY AND VERIFIED WITH: TO VBYRYK(PHARD) BY TCLEVELAND 12/13/2015 AT 1:08AM    Streptococcus species NOT DETECTED NOT DETECTED Final   Streptococcus agalactiae NOT DETECTED NOT DETECTED Final   Streptococcus pneumoniae NOT DETECTED NOT DETECTED Final   Streptococcus pyogenes NOT DETECTED NOT DETECTED Final   Acinetobacter baumannii NOT DETECTED NOT DETECTED Final   Enterobacteriaceae species NOT DETECTED NOT DETECTED Final   Enterobacter cloacae complex NOT DETECTED NOT DETECTED Final   Escherichia coli NOT DETECTED NOT DETECTED Final   Klebsiella oxytoca NOT DETECTED NOT DETECTED Final   Klebsiella pneumoniae NOT DETECTED NOT DETECTED Final   Proteus species NOT DETECTED NOT DETECTED Final   Serratia marcescens NOT DETECTED NOT  DETECTED Final   Carbapenem resistance NOT DETECTED NOT DETECTED Final   Haemophilus influenzae NOT DETECTED NOT DETECTED Final   Neisseria meningitidis NOT DETECTED NOT DETECTED Final   Pseudomonas aeruginosa NOT DETECTED NOT DETECTED Final   Candida albicans NOT DETECTED NOT DETECTED Final   Candida glabrata NOT DETECTED NOT DETECTED Final   Candida krusei NOT DETECTED NOT DETECTED Final   Candida parapsilosis NOT DETECTED NOT DETECTED Final   Candida tropicalis NOT DETECTED NOT DETECTED Final  Culture, blood (Routine x 2)     Status: None   Collection Time: 12/11/15  7:30 PM  Result Value Ref Range Status   Specimen Description BLOOD RIGHT HAND  Final   Special Requests IN PEDIATRIC BOTTLE [facility]  Final   Culture NO GROWTH 5 DAYS  Final   Report Status 12/16/2015 FINAL  Final     Labs: Basic Metabolic Panel:  Recent Labs Lab 12/11/15 1831 12/12/15 0050 12/13/15 0300 12/14/15 0436 12/16/15 0042  NA 136 138 137 138 138  K 4.6 4.4 4.3 4.4 4.4  CL 96* 98* 98* 99* 103  CO2 32 35* 32 33* 30  GLUCOSE 194* 229* 144* 168* 195*  BUN 15 18 20 18 17   CREATININE 0.92 1.02* 1.10* 0.95 0.96  CALCIUM 9.0 8.2* 8.2* 8.4* 8.2*   Liver Function Tests:  Recent Labs Lab 12/11/15 1831 12/12/15 0050 12/13/15 0300  AST 22 19 15   ALT 14 10* 9*  ALKPHOS 61 46 47  BILITOT 1.2 1.3* 0.9  PROT 6.7 5.5* 5.5*  ALBUMIN 3.4* 2.6* 2.5*   No results for input(s): LIPASE, AMYLASE in the last 168 hours.  Recent Labs Lab 12/11/15 1831 12/12/15 0050  AMMONIA 51* 26   CBC:  Recent Labs Lab 12/11/15 1831 12/12/15 0050 12/13/15 0300  WBC 9.5 11.4* 5.6  NEUTROABS 8.6* 10.1*  --   HGB 12.2 10.1* 9.6*  HCT 40.4 32.8* 32.1*  MCV 96.4 96.5 97.0  PLT 122* 109* 98*   Cardiac Enzymes:  Recent Labs Lab 12/12/15 0053 12/12/15 0541 12/12/15 1043  TROPONINI 0.04* 0.04* 0.04*   BNP: BNP (last 3 results)  Recent Labs  09/22/15 1513 09/25/15 0440 12/11/15 1831  BNP 173.0* 131.7*  155.3*    ProBNP (last 3 results) No results  for input(s): PROBNP in the last 8760 hours.  CBG:  Recent Labs Lab 12/16/15 0610 12/16/15 1119 12/16/15 1552 12/16/15 2049 12/17/15 0707  GLUCAP 161* 148* 203* 214* 179*       Signed:  Eleonore Chiquito S MD.  [facility] 12/17/2015, 11:04 AM

## 2015-12-19 ENCOUNTER — Institutional Professional Consult (permissible substitution): Payer: No Typology Code available for payment source | Admitting: Pulmonary Disease

## 2015-12-31 NOTE — Progress Notes (Signed)
PT evaluation addendum Late entry for 09/02/15 PT diagnosis   09/02/15 1115  PT Assessment  PT Therapy Diagnosis  Generalized weakness (other abnormalities of gait and mobility (R26.89))  PT Recommendation/Assessment All further PT needs can be met in the next venue of care  PT Problem List Decreased mobility;Decreased range of motion;Decreased strength;Decreased activity tolerance;Obesity   12/31/2015 [place], [place]

## 2016-02-25 ENCOUNTER — Institutional Professional Consult (permissible substitution): Payer: No Typology Code available for payment source | Admitting: Pulmonary Disease

## 2016-06-09 ENCOUNTER — Encounter (HOSPITAL_COMMUNITY): Payer: Self-pay | Admitting: Emergency Medicine

## 2016-06-09 ENCOUNTER — Emergency Department (HOSPITAL_COMMUNITY): Payer: Medicare Other

## 2016-06-09 ENCOUNTER — Inpatient Hospital Stay (HOSPITAL_COMMUNITY)
Admission: EM | Admit: 2016-06-09 | Discharge: 2016-06-11 | DRG: 194 | Disposition: A | Discharge status: Home Health | Payer: Medicare Other | Attending: Family Medicine | Admitting: Family Medicine

## 2016-06-09 DIAGNOSIS — J9621 Acute and chronic respiratory failure with hypoxia: Secondary | ICD-10-CM | POA: Diagnosis present

## 2016-06-09 DIAGNOSIS — I10 Essential (primary) hypertension: Secondary | ICD-10-CM | POA: Diagnosis not present

## 2016-06-09 DIAGNOSIS — K219 Gastro-esophageal reflux disease without esophagitis: Secondary | ICD-10-CM | POA: Diagnosis present

## 2016-06-09 DIAGNOSIS — I110 Hypertensive heart disease with heart failure: Secondary | ICD-10-CM | POA: Diagnosis present

## 2016-06-09 DIAGNOSIS — E118 Type 2 diabetes mellitus with unspecified complications: Secondary | ICD-10-CM

## 2016-06-09 DIAGNOSIS — Z8249 Family history of ischemic heart disease and other diseases of the circulatory system: Secondary | ICD-10-CM

## 2016-06-09 DIAGNOSIS — Z87891 Personal history of nicotine dependence: Secondary | ICD-10-CM | POA: Diagnosis not present

## 2016-06-09 DIAGNOSIS — Z823 Family history of stroke: Secondary | ICD-10-CM | POA: Diagnosis not present

## 2016-06-09 DIAGNOSIS — J9622 Acute and chronic respiratory failure with hypercapnia: Secondary | ICD-10-CM

## 2016-06-09 DIAGNOSIS — A419 Sepsis, unspecified organism: Secondary | ICD-10-CM | POA: Diagnosis present

## 2016-06-09 DIAGNOSIS — Z794 Long term (current) use of insulin: Secondary | ICD-10-CM | POA: Diagnosis not present

## 2016-06-09 DIAGNOSIS — Z841 Family history of disorders of kidney and ureter: Secondary | ICD-10-CM

## 2016-06-09 DIAGNOSIS — I5032 Chronic diastolic (congestive) heart failure: Secondary | ICD-10-CM | POA: Diagnosis present

## 2016-06-09 DIAGNOSIS — G2581 Restless legs syndrome: Secondary | ICD-10-CM | POA: Diagnosis present

## 2016-06-09 DIAGNOSIS — J181 Lobar pneumonia, unspecified organism: Secondary | ICD-10-CM | POA: Diagnosis not present

## 2016-06-09 DIAGNOSIS — Z86711 Personal history of pulmonary embolism: Secondary | ICD-10-CM | POA: Diagnosis present

## 2016-06-09 DIAGNOSIS — R6510 Systemic inflammatory response syndrome (SIRS) of non-infectious origin without acute organ dysfunction: Secondary | ICD-10-CM | POA: Diagnosis not present

## 2016-06-09 DIAGNOSIS — Z7401 Bed confinement status: Secondary | ICD-10-CM

## 2016-06-09 DIAGNOSIS — E1165 Type 2 diabetes mellitus with hyperglycemia: Secondary | ICD-10-CM | POA: Diagnosis present

## 2016-06-09 DIAGNOSIS — I482 Chronic atrial fibrillation, unspecified: Secondary | ICD-10-CM | POA: Diagnosis present

## 2016-06-09 DIAGNOSIS — M1990 Unspecified osteoarthritis, unspecified site: Secondary | ICD-10-CM | POA: Diagnosis present

## 2016-06-09 DIAGNOSIS — Z825 Family history of asthma and other chronic lower respiratory diseases: Secondary | ICD-10-CM | POA: Diagnosis not present

## 2016-06-09 DIAGNOSIS — Z9981 Dependence on supplemental oxygen: Secondary | ICD-10-CM | POA: Diagnosis not present

## 2016-06-09 DIAGNOSIS — J189 Pneumonia, unspecified organism: Principal | ICD-10-CM | POA: Diagnosis present

## 2016-06-09 DIAGNOSIS — Z79899 Other long term (current) drug therapy: Secondary | ICD-10-CM | POA: Diagnosis not present

## 2016-06-09 DIAGNOSIS — Z7901 Long term (current) use of anticoagulants: Secondary | ICD-10-CM | POA: Diagnosis not present

## 2016-06-09 DIAGNOSIS — G40909 Epilepsy, unspecified, not intractable, without status epilepticus: Secondary | ICD-10-CM | POA: Diagnosis not present

## 2016-06-09 DIAGNOSIS — IMO0002 Reserved for concepts with insufficient information to code with codable children: Secondary | ICD-10-CM | POA: Diagnosis present

## 2016-06-09 DIAGNOSIS — N390 Urinary tract infection, site not specified: Secondary | ICD-10-CM | POA: Diagnosis present

## 2016-06-09 DIAGNOSIS — D6959 Other secondary thrombocytopenia: Secondary | ICD-10-CM | POA: Diagnosis present

## 2016-06-09 DIAGNOSIS — R609 Edema, unspecified: Secondary | ICD-10-CM | POA: Diagnosis not present

## 2016-06-09 DIAGNOSIS — I69359 Hemiplegia and hemiparesis following cerebral infarction affecting unspecified side: Secondary | ICD-10-CM

## 2016-06-09 DIAGNOSIS — J9601 Acute respiratory failure with hypoxia: Secondary | ICD-10-CM | POA: Diagnosis not present

## 2016-06-09 DIAGNOSIS — I429 Cardiomyopathy, unspecified: Secondary | ICD-10-CM | POA: Diagnosis not present

## 2016-06-09 LAB — COMPREHENSIVE METABOLIC PANEL
ALT: 10 U/L — AB (ref 14–54)
AST: 15 U/L (ref 15–41)
Albumin: 3.7 g/dL (ref 3.5–5.0)
Alkaline Phosphatase: 58 U/L (ref 38–126)
Anion gap: 8 (ref 5–15)
BILIRUBIN TOTAL: 0.9 mg/dL (ref 0.3–1.2)
BUN: 26 mg/dL — AB (ref 6–20)
CHLORIDE: 98 mmol/L — AB (ref 101–111)
CO2: 33 mmol/L — ABNORMAL HIGH (ref 22–32)
Calcium: 9.3 mg/dL (ref 8.9–10.3)
Creatinine, Ser: 1.16 mg/dL — ABNORMAL HIGH (ref 0.44–1.00)
GFR calc Af Amer: 48 mL/min — ABNORMAL LOW (ref >60)
GFR calc non Af Amer: 41 mL/min — ABNORMAL LOW (ref >60)
GLUCOSE: 229 mg/dL — AB (ref 65–99)
POTASSIUM: 5.5 mmol/L — AB (ref 3.5–5.1)
Sodium: 139 mmol/L (ref 135–145)
TOTAL PROTEIN: 6.8 g/dL (ref 6.5–8.1)

## 2016-06-09 LAB — URINALYSIS, ROUTINE W REFLEX MICROSCOPIC
Bilirubin Urine: NEGATIVE
Glucose, UA: 50 mg/dL — AB
Ketones, ur: 5 mg/dL — AB
Nitrite: NEGATIVE
Protein, ur: 30 mg/dL — AB
SPECIFIC GRAVITY, URINE: 1.02 (ref 1.005–1.030)
pH: 5 (ref 5.0–8.0)

## 2016-06-09 LAB — GLUCOSE, CAPILLARY
GLUCOSE-CAPILLARY: 304 mg/dL — AB (ref 65–99)
Glucose-Capillary: 248 mg/dL — ABNORMAL HIGH (ref 65–99)

## 2016-06-09 LAB — CBC WITH DIFFERENTIAL/PLATELET
Basophils Absolute: 0 10*3/uL (ref 0.0–0.1)
Basophils Relative: 0 %
EOS PCT: 0 %
Eosinophils Absolute: 0 10*3/uL (ref 0.0–0.7)
HCT: 42.6 % (ref 36.0–46.0)
Hemoglobin: 13.1 g/dL (ref 12.0–15.0)
LYMPHS ABS: 1 10*3/uL (ref 0.7–4.0)
LYMPHS PCT: 19 %
MCH: 30.3 pg (ref 26.0–34.0)
MCHC: 30.8 g/dL (ref 30.0–36.0)
MCV: 98.6 fL (ref 78.0–100.0)
MONO ABS: 0.5 10*3/uL (ref 0.1–1.0)
Monocytes Relative: 9 %
Neutro Abs: 3.5 10*3/uL (ref 1.7–7.7)
Neutrophils Relative %: 72 %
PLATELETS: 133 10*3/uL — AB (ref 150–400)
RBC: 4.32 MIL/uL (ref 3.87–5.11)
RDW: 13.9 % (ref 11.5–15.5)
WBC: 5 10*3/uL (ref 4.0–10.5)

## 2016-06-09 LAB — MAGNESIUM: MAGNESIUM: 1.7 mg/dL (ref 1.7–2.4)

## 2016-06-09 LAB — TROPONIN I
Troponin I: 0.03 ng/mL (ref <0.03)
Troponin I: <0.03 ng/mL (ref <0.03)

## 2016-06-09 LAB — I-STAT TROPONIN, ED: Troponin i, poc: 0.01 ng/mL (ref 0.00–0.08)

## 2016-06-09 LAB — TSH: TSH: 1.051 u[IU]/mL (ref 0.350–4.500)

## 2016-06-09 LAB — I-STAT CG4 LACTIC ACID, ED: LACTIC ACID, VENOUS: 2.94 mmol/L — AB (ref 0.5–1.9)

## 2016-06-09 LAB — LACTIC ACID, PLASMA: Lactic Acid, Venous: 1.8 mmol/L (ref 0.5–1.9)

## 2016-06-09 LAB — STREP PNEUMONIAE URINARY ANTIGEN: STREP PNEUMO URINARY ANTIGEN: NEGATIVE

## 2016-06-09 LAB — INFLUENZA PANEL BY PCR (TYPE A & B)
INFLAPCR: NEGATIVE
Influenza B By PCR: NEGATIVE

## 2016-06-09 MED ORDER — INSULIN GLARGINE 100 UNIT/ML ~~LOC~~ SOLN
22.0000 [IU] | Freq: Every day | SUBCUTANEOUS | Status: DC
  Administered 2016-06-09 – 2016-06-10 (×2): 22 [IU] via SUBCUTANEOUS
  Filled 2016-06-09 (×4): qty 0.22

## 2016-06-09 MED ORDER — ALPRAZOLAM 0.25 MG PO TABS: 0.2500 mg | ORAL_TABLET | Freq: Two times a day (BID) | ORAL | Status: DC | PRN

## 2016-06-09 MED ORDER — LEVETIRACETAM 500 MG PO TABS
500.0000 mg | ORAL_TABLET | Freq: Two times a day (BID) | ORAL | Status: DC
  Administered 2016-06-09 – 2016-06-11 (×4): 500 mg via ORAL
  Filled 2016-06-09 (×4): qty 1

## 2016-06-09 MED ORDER — INSULIN ASPART 100 UNIT/ML ~~LOC~~ SOLN: 0.0000 [IU] | Freq: Three times a day (TID) | SUBCUTANEOUS | Status: DC

## 2016-06-09 MED ORDER — APIXABAN 5 MG PO TABS
5.0000 mg | ORAL_TABLET | Freq: Two times a day (BID) | ORAL | Status: DC
  Administered 2016-06-09 – 2016-06-11 (×4): 5 mg via ORAL
  Filled 2016-06-09 (×4): qty 1

## 2016-06-09 MED ORDER — PREGABALIN 75 MG PO CAPS
75.0000 mg | ORAL_CAPSULE | Freq: Every day | ORAL | Status: DC
  Administered 2016-06-09 – 2016-06-10 (×2): 75 mg via ORAL
  Filled 2016-06-09 (×2): qty 1

## 2016-06-09 MED ORDER — INSULIN ASPART 100 UNIT/ML ~~LOC~~ SOLN
0.0000 [IU] | Freq: Three times a day (TID) | SUBCUTANEOUS | Status: DC
  Administered 2016-06-09 – 2016-06-10 (×2): 5 [IU] via SUBCUTANEOUS
  Administered 2016-06-10: 3 [IU] via SUBCUTANEOUS
  Administered 2016-06-10: 7 [IU] via SUBCUTANEOUS
  Administered 2016-06-11: 5 [IU] via SUBCUTANEOUS
  Administered 2016-06-11: 3 [IU] via SUBCUTANEOUS
  Administered 2016-06-11: 2 [IU] via SUBCUTANEOUS

## 2016-06-09 MED ORDER — SODIUM CHLORIDE 0.9 % IV BOLUS (SEPSIS)
1000.0000 mL | Freq: Once | INTRAVENOUS | Status: AC
  Administered 2016-06-09: 1000 mL via INTRAVENOUS

## 2016-06-09 MED ORDER — DEXTROSE 5 % IV SOLN
500.0000 mg | INTRAVENOUS | Status: DC
  Administered 2016-06-09 – 2016-06-10 (×2): 500 mg via INTRAVENOUS
  Filled 2016-06-09 (×3): qty 500

## 2016-06-09 MED ORDER — ROPINIROLE HCL 1 MG PO TABS
2.0000 mg | ORAL_TABLET | Freq: Three times a day (TID) | ORAL | Status: DC
  Administered 2016-06-09 – 2016-06-11 (×6): 2 mg via ORAL
  Filled 2016-06-09 (×7): qty 2

## 2016-06-09 MED ORDER — CARVEDILOL 3.125 MG PO TABS
3.1250 mg | ORAL_TABLET | Freq: Two times a day (BID) | ORAL | Status: DC
  Administered 2016-06-10 – 2016-06-11 (×4): 3.125 mg via ORAL
  Filled 2016-06-09 (×5): qty 1

## 2016-06-09 MED ORDER — ACETAMINOPHEN 325 MG PO TABS: 650.0000 mg | ORAL_TABLET | Freq: Four times a day (QID) | ORAL | Status: DC | PRN

## 2016-06-09 MED ORDER — POLYETHYLENE GLYCOL 3350 17 G PO PACK: 17.0000 g | PACK | Freq: Every day | ORAL | Status: DC | PRN

## 2016-06-09 MED ORDER — SODIUM CHLORIDE 0.9 % IV SOLN: 250.0000 mL | INTRAVENOUS | Status: DC | PRN

## 2016-06-09 MED ORDER — DEXTROSE 5 % IV SOLN
2.0000 g | INTRAVENOUS | Status: AC
  Administered 2016-06-09: 2 g via INTRAVENOUS
  Filled 2016-06-09: qty 2

## 2016-06-09 MED ORDER — FUROSEMIDE 40 MG PO TABS
40.0000 mg | ORAL_TABLET | Freq: Two times a day (BID) | ORAL | Status: DC
  Administered 2016-06-10 – 2016-06-11 (×4): 40 mg via ORAL
  Filled 2016-06-09 (×4): qty 1

## 2016-06-09 MED ORDER — DEXTROSE 5 % IV SOLN
1.0000 g | INTRAVENOUS | Status: DC
  Administered 2016-06-10 – 2016-06-11 (×2): 1 g via INTRAVENOUS
  Filled 2016-06-09 (×2): qty 10

## 2016-06-09 MED ORDER — SODIUM CHLORIDE 0.9 % IV SOLN
2500.0000 mg | INTRAVENOUS | Status: DC
  Filled 2016-06-09: qty 2500

## 2016-06-09 MED ORDER — ALBUTEROL SULFATE (5 MG/ML) 0.5% IN NEBU: 2.5000 mg | INHALATION_SOLUTION | Freq: Four times a day (QID) | RESPIRATORY_TRACT | Status: DC

## 2016-06-09 MED ORDER — FERROUS SULFATE 325 (65 FE) MG PO TABS
325.0000 mg | ORAL_TABLET | Freq: Every day | ORAL | Status: DC
  Administered 2016-06-10 – 2016-06-11 (×2): 325 mg via ORAL
  Filled 2016-06-09 (×2): qty 1

## 2016-06-09 MED ORDER — ONDANSETRON HCL 4 MG/2ML IJ SOLN: 4.0000 mg | Freq: Four times a day (QID) | INTRAMUSCULAR | Status: DC | PRN

## 2016-06-09 MED ORDER — DEXTROSE 5 % IV SOLN: 1.0000 g | INTRAVENOUS | Status: DC

## 2016-06-09 MED ORDER — SODIUM CHLORIDE 0.9% FLUSH
3.0000 mL | Freq: Two times a day (BID) | INTRAVENOUS | Status: DC
  Administered 2016-06-10 (×2): 3 mL via INTRAVENOUS

## 2016-06-09 MED ORDER — ONDANSETRON HCL 4 MG PO TABS: 4.0000 mg | ORAL_TABLET | Freq: Four times a day (QID) | ORAL | Status: DC | PRN

## 2016-06-09 MED ORDER — LEVALBUTEROL HCL 1.25 MG/0.5ML IN NEBU
1.2500 mg | INHALATION_SOLUTION | Freq: Four times a day (QID) | RESPIRATORY_TRACT | Status: DC | PRN
  Filled 2016-06-09: qty 0.5

## 2016-06-09 MED ORDER — GUAIFENESIN ER 600 MG PO TB12
1200.0000 mg | ORAL_TABLET | Freq: Two times a day (BID) | ORAL | Status: DC
  Administered 2016-06-09 – 2016-06-11 (×4): 1200 mg via ORAL
  Filled 2016-06-09 (×4): qty 2

## 2016-06-09 MED ORDER — SODIUM CHLORIDE 0.9% FLUSH: 3.0000 mL | INTRAVENOUS | Status: DC | PRN

## 2016-06-09 MED ORDER — ACETAMINOPHEN 650 MG RE SUPP: 650.0000 mg | Freq: Four times a day (QID) | RECTAL | Status: DC | PRN

## 2016-06-09 MED ORDER — PANTOPRAZOLE SODIUM 40 MG PO TBEC
40.0000 mg | DELAYED_RELEASE_TABLET | Freq: Two times a day (BID) | ORAL | Status: DC
  Administered 2016-06-10 – 2016-06-11 (×4): 40 mg via ORAL
  Filled 2016-06-09 (×4): qty 1

## 2016-06-09 MED ORDER — POTASSIUM CHLORIDE CRYS ER 20 MEQ PO TBCR: 20.0000 meq | EXTENDED_RELEASE_TABLET | Freq: Every day | ORAL | Status: DC

## 2016-06-09 MED ORDER — ACETAMINOPHEN 325 MG PO TABS
650.0000 mg | ORAL_TABLET | Freq: Once | ORAL | Status: AC
Start: 2016-06-09 — End: 2016-06-09
  Administered 2016-06-09: 650 mg via ORAL
  Filled 2016-06-09: qty 2

## 2016-06-09 MED ORDER — ROPINIROLE HCL 1 MG PO TABS: 2.0000 mg | ORAL_TABLET | Freq: Three times a day (TID) | ORAL | Status: DC

## 2016-06-09 NOTE — ED Triage Notes (Addendum)
Per EMS-fever, and coughing up bloody mucus-started this am-1000 mg of Tylenol given by family-one 2 L of O2 at home-patient complaining of back pain-patient stated she had painful urination

## 2016-06-09 NOTE — Progress Notes (Signed)
CRITICAL VALUE ALERT  Critical value received:  Troponin 0.03  Date of notification:  06/09/16  Time of notification:  1853  Critical value read back:Yes.    Nurse who received alert:  Laural Benes  MD notified (1st page):  Dr. Allyson Sabal  Time of first page:  1854  MD notified (2nd page):  Time of second page:  Responding MD:  Dr. Allyson Sabal  Time MD responded:  [phone]

## 2016-06-09 NOTE — H&P (Addendum)
[facility] History and Physical  Elizabeth Pugh [id number] DOB: 1927/04/05 DOA: 06/09/2016  Referring physician:   PCP: Philis Fendt, MD   Chief Complaint: *Shortness of breath and fever   HPI:  83 year old female with a history of chronic atrial fibrillation on anticoagulation, chronic diastolic CHF, history of CVA, diabetes mellitus type 2and possible OSA was brought to the ER after patient had sudden onset of fever and chills. Patient's granddaughter provides most of the history. She states that the patient has had a cough on and off for the last 1-2 weeks. Yesterday the patient felt warm and attempted to take off her clothes, saying that she "could not breathe and take in a deep breath". Granddaughter checked   axillary temperature that was close to 101F. She also appeared to have labored breathing despite being on her home oxygen. She subsequently had one episode of blood-tinged sputum, and family decided to bring her to the ER for further evaluation via EMS. Patient has also experienced dysuria for the last week. She denies any chest pain. At baseline patient is mostly bedbound because of arthritis and prior CVA neck line in the ER patient was found to have a rectal temperature 103.2, pulse Pulse between 80 and 100, telemetry showing chronic A. fib. Oxygen saturation 94% on 2 L Chest x-ray consistent with community-acquired pneumonia Sepsis workup was initiated. Patient admitted to telemetry for further evaluation     Review of Systems: negative for the following  Constitutional: Positive for fever, chills denied, diaphoresis, appetite change and fatigue.  HEENT: Denies photophobia, eye pain, redness, hearing loss, ear pain, congestion, sore throat, rhinorrhea, sneezing, mouth sores, trouble swallowing, neck pain, neck stiffness and tinnitus.  Respiratory: Denies SOB, DOE, positive for cough, chest tightness, and wheezing.  Cardiovascular: Denies chest pain, palpitations  and leg swelling.  Gastrointestinal: Denies nausea, vomiting, abdominal pain, diarrhea, constipation, blood in stool and abdominal distention.  Genitourinary: Denies dysuria, urgency, frequency, hematuria, flank pain and difficulty urinating.  Musculoskeletal: Denies myalgias, back pain, joint swelling, arthralgias and gait problem.  Skin: Denies pallor, rash and wound.  Neurological: Denies dizziness, seizures, syncope, weakness, light-headedness, numbness and headaches.  Hematological: Denies adenopathy. Easy bruising, personal or family bleeding history  Psychiatric/Behavioral: Denies suicidal ideation, mood changes, confusion, nervousness, sleep disturbance and agitation       Past Medical History:  Diagnosis Date  . Acute delirium 04/12/2011  . Acute kidney injury ([place]) 09/02/2015  . Anemia   . Anxiety   . Arthritis   . Atrial fibrillation ([place])   . Blood transfusion   . Bronchitis   . CHF (congestive heart failure) ([place])   . Diabetes mellitus   . GERD (gastroesophageal reflux disease)   . GI (gastrointestinal bleed)    Due to benign mass  . Hypertension   . Lobar pneumonia due to unspecified organism 09/02/2015  . On home oxygen therapy    "4L; 24/7" (12/11/2015)  . Restless leg syndrome   . Seizures ([place])   . Stroke [facility])      Past Surgical History:  Procedure Laterality Date  . CATARACT EXTRACTION W/ INTRAOCULAR LENS IMPLANT     "Dr. Gershon Crane; think they did the right one"  . EUS  09/24/2011   Procedure: UPPER ENDOSCOPIC ULTRASOUND (EUS) LINEAR;  Surgeon: Beryle Beams, MD;  Location: [facility] ENDOSCOPY;  Service: Endoscopy;  Laterality: N/A;  . TONSILLECTOMY  1935  . TUBAL LIGATION        Social History:  reports that she  quit smoking about 30 years ago. Her smoking use included Cigarettes. She has a 40.00 pack-year smoking history. She has never used smokeless tobacco. She reports that she does not drink alcohol or use drugs.    Allergies  Allergen Reactions  .  Erythromycin Other (See Comments)    Vaginal itching  . Penicillins Rash and Other (See Comments)    Has patient had a PCN reaction causing immediate rash, facial/tongue/throat swelling, SOB or lightheadedness with hypotension: No Has patient had a PCN reaction causing severe rash involving mucus membranes or skin necrosis: No Has patient had a PCN reaction that required hospitalization: No Has patient had a PCN reaction occurring within the last 10 years: No If all of the above answers are "NO", then may proceed with Cephalosporin use.  Marland Kitchen Zithromax [Azithromycin] Other (See Comments)    gave her a yeast infection.    Family History  Problem Relation Age of Onset  . Aneurysm Mother   . Heart attack Father   . Heart failure Sister   . Asthma Sister   . Kidney failure Sister   . Stroke Sister         Prior to Admission medications   Medication Sig Start Date End Date Taking? Authorizing Provider  albuterol (PROVENTIL HFA;VENTOLIN HFA) 108 (90 BASE) MCG/ACT inhaler Inhale 1 puff into the lungs every 6 (six) hours as needed for wheezing or shortness of breath.   Yes Historical Provider, MD  ALPRAZolam (XANAX) 0.25 MG tablet Take 1 tablet (0.25 mg total) by mouth 2 (two) times daily as needed for anxiety. 01/28/15  Yes Geradine Girt, DO  apixaban (ELIQUIS) 5 MG TABS tablet Take 1 tablet (5 mg total) by mouth 2 (two) times daily. 09/27/15  Yes Lavina Hamman, MD  calcium-vitamin D (CALCIUM 500+D) 500-400 MG-UNIT per tablet Take 1 tablet by mouth 2 (two) times daily.   Yes Historical Provider, MD  carvedilol (COREG) 3.125 MG tablet Take 1 tablet (3.125 mg total) by mouth 2 (two) times daily with a meal. 10/01/15  Yes Lavina Hamman, MD  Docusate Calcium (STOOL SOFTENER PO) Take 1 capsule by mouth daily as needed (For constipation.).    Yes Historical Provider, MD  ferrous sulfate 325 (65 FE) MG tablet Take 325 mg by mouth daily with supper.    Yes Historical Provider, MD  furosemide (LASIX)  40 MG tablet Take 40 mg by mouth 2 (two) times daily.   Yes Historical Provider, MD  glimepiride (AMARYL) 2 MG tablet Take 2 mg by mouth daily before breakfast.   Yes Historical Provider, MD  guaiFENesin (MUCINEX) 600 MG 12 hr tablet Take 2 tablets (1,200 mg total) by mouth 2 (two) times daily. 09/02/15  Yes Nishant Dhungel, MD  hydrOXYzine (ATARAX/VISTARIL) 25 MG tablet Take 25 mg by mouth every 8 (eight) hours as needed for itching.  05/10/16  Yes Historical Provider, MD  insulin aspart (NOVOLOG) 100 UNIT/ML injection Inject 0-12 Units into the skin as needed for high blood sugar. Give if blood sugar is greater than 150.   Yes Historical Provider, MD  insulin glargine (LANTUS) 100 UNIT/ML injection Inject 0.15 mLs (15 Units total) into the skin at bedtime. Patient taking differently: Inject 30 Units into the skin at bedtime.  09/27/15  Yes Lavina Hamman, MD  ipratropium-albuterol (DUONEB) 0.5-2.5 (3) MG/3ML SOLN Take 3 mLs by nebulization every 6 (six) hours as needed. Patient taking differently: Take 3 mLs by nebulization every 6 (six) hours as needed (For  shortness of breath.).  01/25/15  Yes [name], MD  levETIRAcetam (KEPPRA) 500 MG tablet Take 1 tablet (500 mg total) by mouth 2 (two) times daily. 02/27/12  Yes Monika Salk, MD  Naphazoline-Pheniramine (ALLERGY EYE OP) Place 1 drop into both eyes as needed (For eye irritation.).   Yes Historical Provider, MD  pantoprazole (PROTONIX) 40 MG tablet Take 1 tablet (40 mg total) by mouth 2 (two) times daily before a meal. 10/01/15  Yes Lavina Hamman, MD  potassium chloride SA (K-DUR,KLOR-CON) 20 MEQ tablet Take 1 tablet (20 mEq total) by mouth daily. 09/27/15  Yes Lavina Hamman, MD  pregabalin (LYRICA) 75 MG capsule Take 75 mg by mouth at bedtime.    Yes Historical Provider, MD  rOPINIRole (REQUIP) 2 MG tablet Take 1 tablet (2 mg total) by mouth 3 (three) times daily. 09/27/15  Yes Lavina Hamman, MD  Vitamin D, Ergocalciferol, (DRISDOL) 50000 units  CAPS capsule Take 50,000 Units by mouth every Saturday. No specific day    Yes Historical Provider, MD  VOLTAREN 1 % GEL Apply 1 application topically 4 (four) times daily as needed (For knee pain.).  10/26/11  Yes Historical Provider, MD     Physical Exam: Vitals:   06/09/16 1334 06/09/16 1346 06/09/16 1441 06/09/16 1443  BP:  140/74 133/78 132/71  Pulse:  95 92 94  Resp:  16 22 20   Temp: (!) 103.2 F (39.6 C)     TempSrc: Rectal     SpO2:  96% 98% 96%      Constitutional: NAD, calm, comfortable Vitals:   06/09/16 1334 06/09/16 1346 06/09/16 1441 06/09/16 1443  BP:  140/74 133/78 132/71  Pulse:  95 92 94  Resp:  16 22 20   Temp: (!) 103.2 F (39.6 C)     TempSrc: Rectal     SpO2:  96% 98% 96%   Eyes: PERRL, lids and conjunctivae normal ENMT: Mucous membranes are moist. Posterior pharynx clear of any exudate or lesions.Normal dentition.  Neck: normal, supple, no masses, no thyromegaly Respiratory:Slight wheezing, no crackles. Normal respiratory effort. No accessory muscle use. Currently on 2 L of oxygen Cardiovascular: Regular rate and rhythm, no murmurs / rubs / gallops. 3+ pitting extremity edema. 2+ pedal pulses. No carotid bruits.  Abdomen: no tenderness, no masses palpated. No hepatosplenomegaly. Bowel sounds positive.  Musculoskeletal: no clubbing / cyanosis. No joint deformity upper and lower extremities. Good ROM, no contractures. Normal muscle tone.  Skin: no rashes, lesions, ulcers. No induration Neurologic: CN 2-12 grossly intact. Sensation intact, DTR normal. Strength 5/5 in all 4.  Psychiatric: Normal judgment and insight. Alert and oriented x 3. Normal mood.     Labs on Admission: I have personally reviewed following labs and imaging studies  CBC:  Recent Labs Lab 06/09/16 1421  WBC 5.0  NEUTROABS 3.5  HGB 13.1  HCT 42.6  MCV 98.6  PLT 133*    Basic Metabolic Panel:  Recent Labs Lab 06/09/16 1421  NA 139  K 5.5*  CL 98*  CO2 33*  GLUCOSE  229*  BUN 26*  CREATININE 1.16*  CALCIUM 9.3    GFR: CrCl cannot be calculated (Unknown ideal weight.).  Liver Function Tests:  Recent Labs Lab 06/09/16 1421  AST 15  ALT 10*  ALKPHOS 58  BILITOT 0.9  PROT 6.8  ALBUMIN 3.7   No results for input(s): LIPASE, AMYLASE in the last 168 hours. No results for input(s): AMMONIA in the last 168 hours.  Coagulation Profile: No results for input(s): INR, PROTIME in the last 168 hours. No results for input(s): DDIMER in the last 72 hours.  Cardiac Enzymes: No results for input(s): CKTOTAL, CKMB, CKMBINDEX, TROPONINI in the last 168 hours.  BNP (last 3 results) No results for input(s): PROBNP in the last 8760 hours.  HbA1C: No results for input(s): HGBA1C in the last 72 hours. Lab Results  Component Value Date   HGBA1C 9.6 (H) 08/29/2015   HGBA1C 8.9 (H) 01/20/2015   HGBA1C 6.6 (H) 02/24/2012     CBG: No results for input(s): GLUCAP in the last 168 hours.  Lipid Profile: No results for input(s): CHOL, HDL, LDLCALC, TRIG, CHOLHDL, LDLDIRECT in the last 72 hours.  Thyroid Function Tests: No results for input(s): TSH, T4TOTAL, FREET4, T3FREE, THYROIDAB in the last 72 hours.  Anemia Panel: No results for input(s): VITAMINB12, FOLATE, FERRITIN, TIBC, IRON, RETICCTPCT in the last 72 hours.  Urine analysis:    Component Value Date/Time   COLORURINE YELLOW 06/09/2016 1400   APPEARANCEUR HAZY (A) 06/09/2016 1400   LABSPEC 1.020 06/09/2016 1400   PHURINE 5.0 06/09/2016 1400   GLUCOSEU 50 (A) 06/09/2016 1400   HGBUR SMALL (A) 06/09/2016 1400   BILIRUBINUR NEGATIVE 06/09/2016 1400   KETONESUR 5 (A) 06/09/2016 1400   PROTEINUR 30 (A) 06/09/2016 1400   UROBILINOGEN 1.0 01/19/2015 2241   NITRITE NEGATIVE 06/09/2016 1400   LEUKOCYTESUR MODERATE (A) 06/09/2016 1400    Sepsis Labs: @LABRCNTIP (procalcitonin:4,lacticidven:4) )No results found for this or any previous visit (from the past 240 hour(s)).        Radiological Exams on Admission: Dg Chest 2 View  Result Date: 06/09/2016 CLINICAL DATA:  Fever and cough EXAM: CHEST  2 VIEW COMPARISON:  December 11, 2015 FINDINGS: There is consolidation in the posterior left base region. Lungs elsewhere clear. Heart is mildly enlarged. The pulmonary vascularity has a pattern suggesting a degree of pulmonary arterial hypertension. No adenopathy evident. No bone lesions. IMPRESSION: Posterior left base consolidation consistent with pneumonia. Mild cardiomegaly. Suspect a degree of pulmonary arterial hypertension. Followup PA and lateral chest radiographs recommended in 3-4 weeks following trial of antibiotic therapy to ensure resolution and exclude underlying malignancy. Electronically Signed   By: Lowella Grip III M.D.   On: 06/09/2016 15:09   Dg Chest 2 View  Result Date: 06/09/2016 CLINICAL DATA:  Fever and cough EXAM: CHEST  2 VIEW COMPARISON:  December 11, 2015 FINDINGS: There is consolidation in the posterior left base region. Lungs elsewhere clear. Heart is mildly enlarged. The pulmonary vascularity has a pattern suggesting a degree of pulmonary arterial hypertension. No adenopathy evident. No bone lesions. IMPRESSION: Posterior left base consolidation consistent with pneumonia. Mild cardiomegaly. Suspect a degree of pulmonary arterial hypertension. Followup PA and lateral chest radiographs recommended in 3-4 weeks following trial of antibiotic therapy to ensure resolution and exclude underlying malignancy. Electronically Signed   By: Lowella Grip III M.D.   On: 06/09/2016 15:09      EKG: Independently reviewed. *A. fib   Assessment/Plan Principal Problem:   CAP (community acquired pneumonia) Active Problems:   DM (diabetes mellitus), type 2, uncontrolled with complications ([place])   CVA, old, hemiparesis ([place])   Hypertension   Chronic atrial fibrillation (H[facility])   Seizure disorder ([place])   History of pulmonary embolism   Acute respiratory  failure with hypoxia (H[facility])   SIRS (systemic inflammatory response syndrome) ([place])     1. SIRS - secondary to community-acquired pneumonia and urinary tract infection.  Pneumonia order set initiated. Blood culture, urine culture, HIV antibody, Legionella and streptococcus urine antigen testing ordered and pending. Note that patient has history of diastolic CHF. Started on cefepime and vancomycin in the ER. Will treat with Rocephin azithromycin for community-acquired pneumonia for the next 7 days. Patient is on 2 L of oxygen at baseline 2. Chronic A. Fib - on Apixaban and Coreg. Chads 2 vasc score is 8. 3. Acute on chronic Diastolic CHF with last EF measured in September 2016 was 55-60% - continue Lasix secondary to #1. May need repeat 2-D echo to reevaluate EF given significant lower extremity edema 4. Diabetes mellitus type 2 - continue Lantus insulin with sliding scale coverage. Hold oral hypoglycemics. 5. Seizures - on Keppra. As per patient's daughter patient has not had any seizures for last many years. 6. Morbid obesity with possible OSA - as per patient daughter patient is in the process of getting a sleep study scheduled. 7. Chronically bedbound secondary to inoperable left lower extremity fracture, prior CVA.     DVT prophylaxis: eliquis     Code Status Orders Full code           consults called:None  Family Communication: Admission, patients condition and plan of care including tests being ordered have been discussed with the patient  who indicates understanding and agree with the plan and Code Status  Admission status: inpatient ?   Disposition plan: Further plan will depend as patient's clinical course evolves and further radiologic and laboratory data become available. Likely home when stable   At the time of admission, it appears that the appropriate admission status for this patient is INPATIENT .Thisis judged to be reasonable and necessary in order to provide the  required intensity of service to ensure the patient's safetygiven thepresenting symptoms, physical exam findings, and initial radiographic and laboratory data in the context of their chronic comorbidities.   [name] MD [facility] Pager [phone]  If 7PM-7AM, please contact night-coverage [email or web address] Password [email or web address]  06/09/2016, 5:12 PM

## 2016-06-09 NOTE — Progress Notes (Signed)
Pharmacy Antibiotic Note  Elizabeth Pugh is a 83 y.o. female admitted on 06/09/2016 with sepsis with evidence of pneumonia and UTI. Pharmacy has been consulted for Vancomycin dosing. Patient also originally ordered Zosyn per pharmacy; however pt with noted PCN allergy (rash).  EPIC records shows patient has received Cefepime in the past.  Spoke with Dr Thomasene Lot and received verbal orders to d/c Zosyn order and to dose Cefepime.    Plan:  Cefepime 2gm IV x 1   Vancomycin 2500mg  IV x 1  Vancomycin trough goal: 15-20 mcg/ml  F/U cultures, renal function, clinical course    UPDATE:  Upon admission, admitting physician has ordered antibiotics to continue with Ceftriaxone and Azithromycin for CAP  Will sign off.  Temp (24hrs), Avg:103.2 F (39.6 C), Min:103.2 F (39.6 C), Max:103.2 F (39.6 C)   Recent Labs Lab 06/09/16 1421 06/09/16 1435  WBC 5.0  --   CREATININE 1.16*  --   LATICACIDVEN  --  2.94*    CrCl cannot be calculated (Unknown ideal weight.).    Allergies  Allergen Reactions  . Erythromycin Other (See Comments)    Vaginal itching  . Penicillins Rash and Other (See Comments)    Has patient had a PCN reaction causing immediate rash, facial/tongue/throat swelling, SOB or lightheadedness with hypotension: No Has patient had a PCN reaction causing severe rash involving mucus membranes or skin necrosis: No Has patient had a PCN reaction that required hospitalization: No Has patient had a PCN reaction occurring within the last 10 years: No If all of the above answers are "NO", then may proceed with Cephalosporin use.  Marland Kitchen Zithromax [Azithromycin] Other (See Comments)    gave her a yeast infection.    Antimicrobials this admission: 2/6 Cefepime >>  2/6 2/6 Vanc >>  2/6  Dose adjustments this admission:    Microbiology results: 2/6 BCx: sent  Thank you for allowing pharmacy to be a part of this patient's care.  Everette Rank, PharmD 06/09/2016 4:59 PM

## 2016-06-09 NOTE — ED Notes (Signed)
Bed: WA13 Expected date:  Expected time:  Means of arrival:  Comments: EMS 

## 2016-06-09 NOTE — ED Provider Notes (Signed)
[facility] DEPT Provider Note   CSN: [id number] Arrival date & time: 06/09/16  1313     History   Chief Complaint Chief Complaint  Patient presents with  . Fever  . coughing up blood    HPI Elizabeth Pugh is a 83 y.o. female.  HPI   83 y.o.femalewith atrial fibrillation, diastolic CHF, diabetes mellitus type 2and possible OSA was brought to the ER for high fever.  Patient was acting mildly altered today at home and so her daughter measured her temperature. It measured 103. She brought her here to the emergency department. Patient complained of mild pain during urination. She's also complained of cough. When EMS came to pick up patient she had a coughing spit spell in which she coughed up a tiny blood-tinged sputum. Patient's had several days of cough and congestion.    Past Medical History:  Diagnosis Date  . Acute delirium 04/12/2011  . Acute kidney injury ([place]) 09/02/2015  . Anemia   . Anxiety   . Arthritis   . Atrial fibrillation ([place])   . Blood transfusion   . Bronchitis   . CHF (congestive heart failure) ([place])   . Diabetes mellitus   . GERD (gastroesophageal reflux disease)   . GI (gastrointestinal bleed)    Due to benign mass  . Hypertension   . Lobar pneumonia due to unspecified organism 09/02/2015  . On home oxygen therapy    "4L; 24/7" (12/11/2015)  . Restless leg syndrome   . Seizures ([place])   . Stroke [facility])     Patient Active Problem List   Diagnosis Date Noted  . Cellulitis 12/16/2015  . Confusion 12/11/2015  . SIRS (systemic inflammatory response syndrome) ([place]) 12/11/2015  . Chronic respiratory failure with hypoxia and hypercapnia ([place]) 09/28/2015  . Acute encephalopathy   . Acute on chronic congestive heart failure ([place])   . Needs sleep apnea assessment   . Acute on chronic diastolic (congestive) heart failure 09/22/2015  . Acute kidney injury ([place]) 09/02/2015  . Lobar pneumonia due to unspecified organism 09/02/2015  . Bilateral leg edema     . Acute respiratory failure with hypoxia ([place]) 08/29/2015  . Acute bronchitis 08/29/2015  . Acute hyperkalemia 08/29/2015  . History of pulmonary embolism 01/27/2015  . Chest pain at rest 01/27/2015  . Shortness of breath at rest 01/27/2015  . Chronic diastolic heart failure ([place]) 01/27/2015  . Thrombocytopenia ([place]) 01/27/2015  . Abnormal findings on radiological examination of gastrointestinal tract 01/27/2015  . Chronic atrial fibrillation ([place])   . Seizure disorder ([place])   . Anxiety   . Anemia   . Restless leg syndrome   . Acute delirium 04/12/2011  . DM (diabetes mellitus), type 2, uncontrolled with complications ([place]) 0000000  . CVA, old, hemiparesis ([place]) 03/25/2011  . Hypertension 03/25/2011    Past Surgical History:  Procedure Laterality Date  . CATARACT EXTRACTION W/ INTRAOCULAR LENS IMPLANT     "Dr. Gershon Crane; think they did the right one"  . EUS  09/24/2011   Procedure: UPPER ENDOSCOPIC ULTRASOUND (EUS) LINEAR;  Surgeon: Beryle Beams, MD;  Location: [facility] ENDOSCOPY;  Service: Endoscopy;  Laterality: N/A;  . TONSILLECTOMY  1935  . TUBAL LIGATION      OB History    No data available       Home Medications    Prior to Admission medications   Medication Sig Start Date End Date Taking? Authorizing Provider  albuterol (PROVENTIL HFA;VENTOLIN HFA) 108 (90 BASE) MCG/ACT inhaler Inhale 1 puff  into the lungs every 6 (six) hours as needed for wheezing or shortness of breath.   Yes Historical Provider, MD  ALPRAZolam (XANAX) 0.25 MG tablet Take 1 tablet (0.25 mg total) by mouth 2 (two) times daily as needed for anxiety. 01/28/15  Yes Geradine Girt, DO  apixaban (ELIQUIS) 5 MG TABS tablet Take 1 tablet (5 mg total) by mouth 2 (two) times daily. 09/27/15  Yes Lavina Hamman, MD  calcium-vitamin D (CALCIUM 500+D) 500-400 MG-UNIT per tablet Take 1 tablet by mouth 2 (two) times daily.   Yes Historical Provider, MD  carvedilol (COREG) 3.125 MG tablet Take 1 tablet (3.125 mg  total) by mouth 2 (two) times daily with a meal. 10/01/15  Yes Lavina Hamman, MD  Docusate Calcium (STOOL SOFTENER PO) Take 1 capsule by mouth daily as needed (For constipation.).    Yes Historical Provider, MD  ferrous sulfate 325 (65 FE) MG tablet Take 325 mg by mouth daily with supper.    Yes Historical Provider, MD  furosemide (LASIX) 40 MG tablet Take 40 mg by mouth 2 (two) times daily.   Yes Historical Provider, MD  glimepiride (AMARYL) 2 MG tablet Take 2 mg by mouth daily before breakfast.   Yes Historical Provider, MD  guaiFENesin (MUCINEX) 600 MG 12 hr tablet Take 2 tablets (1,200 mg total) by mouth 2 (two) times daily. 09/02/15  Yes Nishant Dhungel, MD  hydrOXYzine (ATARAX/VISTARIL) 25 MG tablet Take 25 mg by mouth every 8 (eight) hours as needed for itching.  05/10/16  Yes Historical Provider, MD  insulin aspart (NOVOLOG) 100 UNIT/ML injection Inject 0-12 Units into the skin as needed for high blood sugar. Give if blood sugar is greater than 150.   Yes Historical Provider, MD  insulin glargine (LANTUS) 100 UNIT/ML injection Inject 0.15 mLs (15 Units total) into the skin at bedtime. Patient taking differently: Inject 30 Units into the skin at bedtime.  09/27/15  Yes Lavina Hamman, MD  ipratropium-albuterol (DUONEB) 0.5-2.5 (3) MG/3ML SOLN Take 3 mLs by nebulization every 6 (six) hours as needed. Patient taking differently: Take 3 mLs by nebulization every 6 (six) hours as needed (For shortness of breath.).  01/25/15  Yes [name], MD  levETIRAcetam (KEPPRA) 500 MG tablet Take 1 tablet (500 mg total) by mouth 2 (two) times daily. 02/27/12  Yes Monika Salk, MD  Naphazoline-Pheniramine (ALLERGY EYE OP) Place 1 drop into both eyes as needed (For eye irritation.).   Yes Historical Provider, MD  pantoprazole (PROTONIX) 40 MG tablet Take 1 tablet (40 mg total) by mouth 2 (two) times daily before a meal. 10/01/15  Yes Lavina Hamman, MD  potassium chloride SA (K-DUR,KLOR-CON) 20 MEQ tablet Take 1  tablet (20 mEq total) by mouth daily. 09/27/15  Yes Lavina Hamman, MD  pregabalin (LYRICA) 75 MG capsule Take 75 mg by mouth at bedtime.    Yes Historical Provider, MD  rOPINIRole (REQUIP) 2 MG tablet Take 1 tablet (2 mg total) by mouth 3 (three) times daily. 09/27/15  Yes Lavina Hamman, MD  Vitamin D, Ergocalciferol, (DRISDOL) 50000 units CAPS capsule Take 50,000 Units by mouth every Saturday. No specific day    Yes Historical Provider, MD  VOLTAREN 1 % GEL Apply 1 application topically 4 (four) times daily as needed (For knee pain.).  10/26/11  Yes Historical Provider, MD    Family History Family History  Problem Relation Age of Onset  . Aneurysm Mother   . Heart attack  Father   . Heart failure Sister   . Asthma Sister   . Kidney failure Sister   . Stroke Sister     Social History Social History  Substance Use Topics  . Smoking status: Former Smoker    Packs/day: 1.00    Years: 40.00    Types: Cigarettes    Quit date: 09/21/1985  . Smokeless tobacco: Never Used  . Alcohol use No     Allergies   Erythromycin; Penicillins; and Zithromax [azithromycin]   Review of Systems Review of Systems  Constitutional: Positive for activity change, appetite change, fatigue and fever.  Respiratory: Positive for cough and shortness of breath.   Cardiovascular: Positive for chest pain.  Gastrointestinal: Negative for abdominal pain.  Genitourinary: Positive for dysuria.  All other systems reviewed and are negative.    Physical Exam Updated Vital Signs BP 132/71   Pulse 94   Temp (!) 103.2 F (39.6 C) (Rectal)   Resp 20   SpO2 96%   Physical Exam  Constitutional: She is oriented to person, place, and time. She appears well-developed and well-nourished.  morbidy obese.  HENT:  Head: Normocephalic and atraumatic.  Nose: Nose normal.  Mouth/Throat: No oropharyngeal exudate.  Eyes: Pupils are equal, round, and reactive to light. Right eye exhibits no discharge. Left eye exhibits  no discharge.  Cardiovascular: Normal rate, regular rhythm and normal heart sounds.   No murmur heard. Pulmonary/Chest: Effort normal and breath sounds normal. She has no wheezes. She has no rales.  Abdominal: Soft. She exhibits no distension. There is no tenderness.  Neurological: She is oriented to person, place, and time.  Skin: Skin is warm and dry. She is not diaphoretic.  Bilateral changes to bilateral lower extremities. No overt cellulitis noted.  Psychiatric: She has a normal mood and affect.  Nursing note and vitals reviewed.    ED Treatments / Results  Labs (all labs ordered are listed, but only abnormal results are displayed) Labs Reviewed  COMPREHENSIVE METABOLIC PANEL - Abnormal; Notable for the following:       Result Value   Potassium 5.5 (*)    Chloride 98 (*)    CO2 33 (*)    Glucose, Bld 229 (*)    BUN 26 (*)    Creatinine, Ser 1.16 (*)    ALT 10 (*)    GFR calc non Af Amer 41 (*)    GFR calc Af Amer 48 (*)    All other components within normal limits  CBC WITH DIFFERENTIAL/PLATELET - Abnormal; Notable for the following:    Platelets 133 (*)    All other components within normal limits  URINALYSIS, ROUTINE W REFLEX MICROSCOPIC - Abnormal; Notable for the following:    APPearance HAZY (*)    Glucose, UA 50 (*)    Hgb urine dipstick SMALL (*)    Ketones, ur 5 (*)    Protein, ur 30 (*)    Leukocytes, UA MODERATE (*)    Bacteria, UA RARE (*)    Squamous Epithelial / LPF 0-5 (*)    All other components within normal limits  I-STAT CG4 LACTIC ACID, ED - Abnormal; Notable for the following:    Lactic Acid, Venous 2.94 (*)    All other components within normal limits  CULTURE, BLOOD (ROUTINE X 2)  CULTURE, BLOOD (ROUTINE X 2)  INFLUENZA PANEL BY PCR (TYPE A & B)  I-STAT TROPOININ, ED    EKG  EKG Interpretation None  Radiology Dg Chest 2 View  Result Date: 06/09/2016 CLINICAL DATA:  Fever and cough EXAM: CHEST  2 VIEW COMPARISON:  December 11, 2015 FINDINGS: There is consolidation in the posterior left base region. Lungs elsewhere clear. Heart is mildly enlarged. The pulmonary vascularity has a pattern suggesting a degree of pulmonary arterial hypertension. No adenopathy evident. No bone lesions. IMPRESSION: Posterior left base consolidation consistent with pneumonia. Mild cardiomegaly. Suspect a degree of pulmonary arterial hypertension. Followup PA and lateral chest radiographs recommended in 3-4 weeks following trial of antibiotic therapy to ensure resolution and exclude underlying malignancy. Electronically Signed   By: Lowella Grip III M.D.   On: 06/09/2016 15:09    Procedures Procedures (including critical care time)  Medications Ordered in ED Medications  sodium chloride 0.9 % bolus 1,000 mL (not administered)  acetaminophen (TYLENOL) tablet 650 mg (not administered)  sodium chloride 0.9 % bolus 1,000 mL (1,000 mLs Intravenous New Bag/Given 06/09/16 1451)     Initial Impression / Assessment and Plan / ED Course  I have reviewed the triage vital signs and the nursing notes.  Pertinent labs & imaging results that were available during my care of the patient were reviewed by me and considered in my medical decision making (see chart for details).     83 y.o.morbidly obese femalewith atrial fibrillation, diastolic CHF, diabetes mellitus type 2brought to ER with high fevers. Possible source includes flu, pneumonia, UTI. We'll do sepsis workup including blood cultures given high fevers. Currently vital signs are stable.  4:10 PM Evidence of both pna, and UTI. Patinet has mildly elevated lactate.   Will give fluids, broad-spectrum antibiotics for CAP and UTI. Will require admission. We'll treat fever.  CRITICAL CARE Performed by: Gardiner Sleeper Total critical care time: 30 minutes Critical care time was exclusive of separately billable procedures and treating other patients. Critical care was necessary to treat  or prevent imminent or life-threatening deterioration. Critical care was time spent personally by me on the following activities: development of treatment plan with patient and/or surrogate as well as nursing, discussions with consultants, evaluation of patient's response to treatment, examination of patient, obtaining history from patient or surrogate, ordering and performing treatments and interventions, ordering and review of laboratory studies, ordering and review of radiographic studies, pulse oximetry and re-evaluation of patient's condition.   Final Clinical Impressions(s) / ED Diagnoses   Final diagnoses:  None    New Prescriptions New Prescriptions   No medications on file     Janeece Blok Julio Alm, MD 06/09/16 1610

## 2016-06-10 ENCOUNTER — Inpatient Hospital Stay (HOSPITAL_COMMUNITY): Payer: Medicare Other

## 2016-06-10 DIAGNOSIS — E118 Type 2 diabetes mellitus with unspecified complications: Secondary | ICD-10-CM

## 2016-06-10 DIAGNOSIS — I482 Chronic atrial fibrillation: Secondary | ICD-10-CM

## 2016-06-10 DIAGNOSIS — G40909 Epilepsy, unspecified, not intractable, without status epilepticus: Secondary | ICD-10-CM

## 2016-06-10 DIAGNOSIS — I10 Essential (primary) hypertension: Secondary | ICD-10-CM

## 2016-06-10 DIAGNOSIS — Z86711 Personal history of pulmonary embolism: Secondary | ICD-10-CM

## 2016-06-10 DIAGNOSIS — E1165 Type 2 diabetes mellitus with hyperglycemia: Secondary | ICD-10-CM

## 2016-06-10 DIAGNOSIS — J9601 Acute respiratory failure with hypoxia: Secondary | ICD-10-CM

## 2016-06-10 DIAGNOSIS — J181 Lobar pneumonia, unspecified organism: Secondary | ICD-10-CM

## 2016-06-10 DIAGNOSIS — I429 Cardiomyopathy, unspecified: Secondary | ICD-10-CM

## 2016-06-10 DIAGNOSIS — I69359 Hemiplegia and hemiparesis following cerebral infarction affecting unspecified side: Secondary | ICD-10-CM

## 2016-06-10 DIAGNOSIS — Z794 Long term (current) use of insulin: Secondary | ICD-10-CM

## 2016-06-10 DIAGNOSIS — R6510 Systemic inflammatory response syndrome (SIRS) of non-infectious origin without acute organ dysfunction: Secondary | ICD-10-CM

## 2016-06-10 LAB — RESPIRATORY PANEL BY PCR
ADENOVIRUS-RVPPCR: NOT DETECTED
Bordetella pertussis: NOT DETECTED
CHLAMYDOPHILA PNEUMONIAE-RVPPCR: NOT DETECTED
CORONAVIRUS HKU1-RVPPCR: NOT DETECTED
CORONAVIRUS NL63-RVPPCR: NOT DETECTED
CORONAVIRUS OC43-RVPPCR: NOT DETECTED
Coronavirus 229E: NOT DETECTED
INFLUENZA A-RVPPCR: NOT DETECTED
Influenza B: NOT DETECTED
MYCOPLASMA PNEUMONIAE-RVPPCR: NOT DETECTED
Metapneumovirus: NOT DETECTED
PARAINFLUENZA VIRUS 1-RVPPCR: NOT DETECTED
PARAINFLUENZA VIRUS 3-RVPPCR: NOT DETECTED
PARAINFLUENZA VIRUS 4-RVPPCR: NOT DETECTED
Parainfluenza Virus 2: NOT DETECTED
RHINOVIRUS / ENTEROVIRUS - RVPPCR: NOT DETECTED
Respiratory Syncytial Virus: NOT DETECTED

## 2016-06-10 LAB — COMPREHENSIVE METABOLIC PANEL
ALT: 11 U/L — ABNORMAL LOW (ref 14–54)
ANION GAP: 4 — AB (ref 5–15)
AST: 14 U/L — ABNORMAL LOW (ref 15–41)
Albumin: 2.9 g/dL — ABNORMAL LOW (ref 3.5–5.0)
Alkaline Phosphatase: 40 U/L (ref 38–126)
BILIRUBIN TOTAL: 1.2 mg/dL (ref 0.3–1.2)
BUN: 28 mg/dL — ABNORMAL HIGH (ref 6–20)
CO2: 34 mmol/L — ABNORMAL HIGH (ref 22–32)
Calcium: 8.5 mg/dL — ABNORMAL LOW (ref 8.9–10.3)
Chloride: 101 mmol/L (ref 101–111)
Creatinine, Ser: 1.11 mg/dL — ABNORMAL HIGH (ref 0.44–1.00)
GFR, EST AFRICAN AMERICAN: 50 mL/min — AB (ref >60)
GFR, EST NON AFRICAN AMERICAN: 43 mL/min — AB (ref >60)
Glucose, Bld: 261 mg/dL — ABNORMAL HIGH (ref 65–99)
POTASSIUM: 4.7 mmol/L (ref 3.5–5.1)
Sodium: 139 mmol/L (ref 135–145)
TOTAL PROTEIN: 5.9 g/dL — AB (ref 6.5–8.1)

## 2016-06-10 LAB — CBC
HEMATOCRIT: 33.3 % — AB (ref 36.0–46.0)
HEMOGLOBIN: 10.2 g/dL — AB (ref 12.0–15.0)
MCH: 29.2 pg (ref 26.0–34.0)
MCHC: 30.6 g/dL (ref 30.0–36.0)
MCV: 95.4 fL (ref 78.0–100.0)
Platelets: 115 10*3/uL — ABNORMAL LOW (ref 150–400)
RBC: 3.49 MIL/uL — AB (ref 3.87–5.11)
RDW: 14 % (ref 11.5–15.5)
WBC: 6.4 10*3/uL (ref 4.0–10.5)

## 2016-06-10 LAB — GLUCOSE, CAPILLARY
Glucose-Capillary: 205 mg/dL — ABNORMAL HIGH (ref 65–99)
Glucose-Capillary: 317 mg/dL — ABNORMAL HIGH (ref 65–99)
Glucose-Capillary: 272 mg/dL — ABNORMAL HIGH (ref 65–99)
Glucose-Capillary: 229 mg/dL — ABNORMAL HIGH (ref 65–99)

## 2016-06-10 LAB — EXPECTORATED SPUTUM ASSESSMENT W REFEX TO RESP CULTURE

## 2016-06-10 LAB — HIV ANTIBODY (ROUTINE TESTING W REFLEX): HIV Screen 4th Generation wRfx: NONREACTIVE

## 2016-06-10 LAB — EXPECTORATED SPUTUM ASSESSMENT W GRAM STAIN, RFLX TO RESP C

## 2016-06-10 LAB — ECHOCARDIOGRAM COMPLETE

## 2016-06-10 NOTE — Progress Notes (Signed)
Inpatient Diabetes Program Recommendations  AACE/ADA: New Consensus Statement on Inpatient Glycemic Control (2015)  Target Ranges:  Prepandial:   less than 140 mg/dL      Peak postprandial:   less than 180 mg/dL (1-2 hours)      Critically ill patients:  140 - 180 mg/dL   Results for Elizabeth Pugh, Elizabeth Pugh (MRN [id number]) as of 06/10/2016 08:19  Ref. Range 06/09/2016 18:03 06/09/2016 22:10 06/10/2016 07:59  Glucose-Capillary Latest Ref Range: 65 - 99 mg/dL 248 (H) 304 (H) 205 (H)    Admit with: SIRS/ Pneumonia/ UTI  History: DM, CHF, CVA  Home DM Meds: Lantus 30 units QHS       Novolog 0-12 units for CBG >150 mg/dl       Amaryl 2 mg daily  Current Insulin Orders: Lantus 22 units QHS      Novolog Sensitive Correction Scale/ SSI (0-9 units) TID AC      MD- Please consider the following in-hospital insulin adjustments:  1. Increase Lantus to 30 units QHS (home dose)  2. Increase Novolog Correction Scale/ SSI to Moderate scale (0-15 units) TID AC + HS      --Will follow patient during hospitalization--  Wyn Quaker RN, MSN, CDE Diabetes Coordinator Inpatient Glycemic Control Team Team Pager: [phone] (8a-5p)

## 2016-06-10 NOTE — Consult Note (Signed)
   [facility] CM Inpatient Consult   06/10/2016  EMILIN MAPES Dec 07, 1926 [id number]    Patient screened for [facility] Care Management program. Chart reviewed.  Patient's Primary Care MD, Dr. Jeanie Cooks, is no longer a [facility] provider. Therefore, Ms. Weismann is not eligible for [facility] Care Management at this time.  Marthenia Rolling, MSN-Ed, RN,BSN [facility] Liaison [phone]

## 2016-06-10 NOTE — Progress Notes (Signed)
PROGRESS NOTE    Elizabeth Pugh  [id number] DOB: 02-Jun-1926 DOA: 06/09/2016 PCP: Philis Fendt, MD   Brief Narrative: Elizabeth Pugh is a 83 y.o. female with a history of atrial fibrillation on chronic anticoagulation, chronic diastolic congestive heart failure, history of CVA, diabetes mellitus type 2,? OSA. Patient presented with shortness of breath and fever found to have community-acquired pneumonia.   Assessment & Plan:   Principal Problem:   CAP (community acquired pneumonia) Active Problems:   DM (diabetes mellitus), type 2, uncontrolled with complications ([place])   CVA, old, hemiparesis ([place])   Hypertension   Chronic atrial fibrillation (HCC)   Seizure disorder ([place])   History of pulmonary embolism   Acute respiratory failure with hypoxia (HCC)   SIRS (systemic inflammatory response syndrome) ([place])   Community acquired pneumonia Initially met sepsis criteria although has chronic atrial fibrillation. Symptoms improved. Urine strep pneumo antigen negative. Influenza negative. -Continue ceftriaxone and azithromycin -Urine Legionella antigen pending -Sputum Gram stain pending -Sputum culture pending -Respiratory virus panel pending  Chronic atrial fibrillation CHA2DS2-VASc Score is 8. -Continue apixaban and Coreg  Chronic diastolic heart failure Stable. Does not appear to be in acute heart failure. Significant lotion recent swelling but is chronic. Unknown dry weight. -Daily weights -Continue Lasix -Continue Coreg  Lower extremity edema Appears chronic but left is greater than right. No tenderness. Favor chronic venous changes rather than acute DVT. Patient's DBT while score is 3. -LE duplex  Decreased pedal pulses -ABI  UTI Patient with symptoms of dysuria. Urinalysis not very suggestive. Urine culture not obtained -ceftriaxone as above  Diabetes mellitus, type II Patient on Lantus as an outpatient in addition to NovoLog. She is also on oral  hypoglycemics. -Continue Lantus -Continue sliding-scale insulin -Discontinue oral hypoglycemics at discharge  Seizure disorder Stable -Continue Keppra  Restless leg syndrome -continue Requip   DVT prophylaxis: Eliquis Code Status: Full code Family Communication: Granddaughter, sister Disposition Plan: Anticipate discharge to home vs SNF pending physical therapy evaluation   Consultants:   None  Procedures:   None  Antimicrobials:  Ceftriaxone (2/6>>  Azithromycin (2/6>>    Subjective: Patient reports some hemoptysis with coughing up phlegm. No chest pain or dyspnea.  Objective: Vitals:   06/09/16 1645 06/09/16 1800 06/09/16 2204 06/10/16 0457  BP:  (!) 104/55 106/64 (!) 130/45  Pulse: 84 (!) 101 75 87  Resp: '20 20 20 20  ' Temp:  99 F (37.2 C) 99.2 F (37.3 C) 98.1 F (36.7 C)  TempSrc:  Oral Oral Oral  SpO2: 94% 97% 99% 100%    Intake/Output Summary (Last 24 hours) at 06/10/16 1602 Last data filed at 06/10/16 1058  Gross per 24 hour  Intake              240 ml  Output                0 ml  Net              240 ml   There were no vitals filed for this visit.  Examination:  General exam: Appears calm and comfortable Respiratory system: Clear to anterior auscultation however diminished. Respiratory effort normal. Cardiovascular system: S1 & S2 heard, RRR. No murmurs. Cannot feel pedal pulses bilaterally Gastrointestinal system: Abdomen is nondistended, soft and nontender. No organomegaly or masses felt. Normal bowel sounds heard. Central nervous system: Alert and oriented. No focal neurological deficits. Extremities: significant pitting edema in lower extremities. No calf tenderness Skin: No cyanosis. No  rashes Psychiatry: Judgement and insight appear normal. Mood & affect appropriate.     Data Reviewed: I have personally reviewed following labs and imaging studies  CBC:  Recent Labs Lab 06/09/16 1421 06/10/16 0411  WBC 5.0 6.4  NEUTROABS  3.5  --   HGB 13.1 10.2*  HCT 42.6 33.3*  MCV 98.6 95.4  PLT 133* 465*   Basic Metabolic Panel:  Recent Labs Lab 06/09/16 1421 06/09/16 1802 06/10/16 0411  NA 139  --  139  K 5.5*  --  4.7  CL 98*  --  101  CO2 33*  --  34*  GLUCOSE 229*  --  261*  BUN 26*  --  28*  CREATININE 1.16*  --  1.11*  CALCIUM 9.3  --  8.5*  MG  --  1.7  --    GFR: CrCl cannot be calculated (Unknown ideal weight.). Liver Function Tests:  Recent Labs Lab 06/09/16 1421 06/10/16 0411  AST 15 14*  ALT 10* 11*  ALKPHOS 58 40  BILITOT 0.9 1.2  PROT 6.8 5.9*  ALBUMIN 3.7 2.9*   No results for input(s): LIPASE, AMYLASE in the last 168 hours. No results for input(s): AMMONIA in the last 168 hours. Coagulation Profile: No results for input(s): INR, PROTIME in the last 168 hours. Cardiac Enzymes:  Recent Labs Lab 06/09/16 1802 06/09/16 2232  TROPONINI 0.03* <0.03   BNP (last 3 results) No results for input(s): PROBNP in the last 8760 hours. HbA1C: No results for input(s): HGBA1C in the last 72 hours. CBG:  Recent Labs Lab 06/09/16 1803 06/09/16 2210 06/10/16 0759 06/10/16 1247  GLUCAP 248* 304* 205* 317*   Lipid Profile: No results for input(s): CHOL, HDL, LDLCALC, TRIG, CHOLHDL, LDLDIRECT in the last 72 hours. Thyroid Function Tests:  Recent Labs  06/09/16 1802  TSH 1.051   Anemia Panel: No results for input(s): VITAMINB12, FOLATE, FERRITIN, TIBC, IRON, RETICCTPCT in the last 72 hours. Sepsis Labs:  Recent Labs Lab 06/09/16 1435 06/09/16 2232  LATICACIDVEN 2.94* 1.8    Recent Results (from the past 240 hour(s))  Blood culture (routine x 2)     Status: None (Preliminary result)   Collection Time: 06/09/16  2:22 PM  Result Value Ref Range Status   Specimen Description BLOOD LEFT ARM  Final   Special Requests BOTTLES DRAWN AEROBIC AND ANAEROBIC 5 CC EA  Final   Culture   Final    NO GROWTH < 24 HOURS Performed at [facility], [place] [address]  [place], [place] [address]    Report Status PENDING  Incomplete  Blood culture (routine x 2)     Status: None (Preliminary result)   Collection Time: 06/09/16  2:22 PM  Result Value Ref Range Status   Specimen Description BLOOD RIGHT ARM  Final   Special Requests IN PEDIATRIC BOTTLE  Final   Culture   Final    NO GROWTH < 24 HOURS Performed at [facility], [employer] [address], [place], [place] [address]    Report Status PENDING  Incomplete  Culture, blood (routine x 2) Call MD if unable to obtain prior to antibiotics being given     Status: None (Preliminary result)   Collection Time: 06/09/16  8:13 PM  Result Value Ref Range Status   Specimen Description BLOOD LEFT HAND  Final   Special Requests BOTTLES DRAWN AEROBIC ONLY 5CC  Final   Culture   Final    NO GROWTH < 24 HOURS Performed  at [facility], [place] [address] [place],  [address]    Report Status PENDING  Incomplete  Culture, sputum-assessment     Status: None   Collection Time: 06/10/16  8:59 AM  Result Value Ref Range Status   Specimen Description SPUTUM  Final   Special Requests NONE  Final   Sputum evaluation THIS SPECIMEN IS ACCEPTABLE FOR SPUTUM CULTURE  Final   Report Status 06/10/2016 FINAL  Final  Culture, respiratory (NON-Expectorated)     Status: None (Preliminary result)   Collection Time: 06/10/16  8:59 AM  Result Value Ref Range Status   Specimen Description SPUTUM  Final   Special Requests NONE Reflexed from [id number]  Final   Gram Stain   Final    FEW WBC PRESENT,BOTH PMN AND MONONUCLEAR RARE GRAM POSITIVE COCCI RARE GRAM NEGATIVE RODS Performed at [facility], [place] [address] [place],  [address]    Culture PENDING  Incomplete   Report Status PENDING  Incomplete         Radiology Studies: Dg Chest 2 View  Result Date: 06/09/2016 CLINICAL DATA:  Fever and cough EXAM: CHEST  2 VIEW COMPARISON:  December 11, 2015 FINDINGS: There is consolidation in the posterior left base region.  Lungs elsewhere clear. Heart is mildly enlarged. The pulmonary vascularity has a pattern suggesting a degree of pulmonary arterial hypertension. No adenopathy evident. No bone lesions. IMPRESSION: Posterior left base consolidation consistent with pneumonia. Mild cardiomegaly. Suspect a degree of pulmonary arterial hypertension. Followup PA and lateral chest radiographs recommended in 3-4 weeks following trial of antibiotic therapy to ensure resolution and exclude underlying malignancy. Electronically Signed   By: Lowella Grip III M.D.   On: 06/09/2016 15:09        Scheduled Meds: . apixaban  5 mg Oral BID  . azithromycin  500 mg Intravenous Q24H  . carvedilol  3.125 mg Oral BID WC  . cefTRIAXone (ROCEPHIN)  IV  1 g Intravenous Q24H  . ferrous sulfate  325 mg Oral Q supper  . furosemide  40 mg Oral BID  . guaiFENesin  1,200 mg Oral BID  . insulin aspart  0-9 Units Subcutaneous TID WC  . insulin glargine  22 Units Subcutaneous QHS  . levETIRAcetam  500 mg Oral BID  . pantoprazole  40 mg Oral BID AC  . pregabalin  75 mg Oral QHS  . rOPINIRole  2 mg Oral TID  . sodium chloride flush  3 mL Intravenous Q12H   Continuous Infusions:   LOS: 1 day     Cordelia Poche [facility] 06/10/2016, 4:02 PM Pager: [phone]  If 7PM-7AM, please contact night-coverage [email or web address] Password [email or web address] 06/10/2016, 4:02 PM

## 2016-06-10 NOTE — Progress Notes (Signed)
  Echocardiogram 2D Echocardiogram has been performed.  Elizabeth Pugh 06/10/2016, 4:07 PM

## 2016-06-10 NOTE — Care Management Note (Signed)
Case Management Note  Patient Details  Name: Elizabeth Pugh MRN: [id number] Date of Birth: 06-07-1926  Subjective/Objective: 83 y.o. F admitted for Sepsis, Pne and UTI. Pt uses Home O2 continuously provided by [facility]. Has Hospital bed, Hoyer lift and W/C as she is Completely Immobile due to Arthritis per Daughter [name] who can be reached by phone.                    Action/Plan:CM will sign off for now but will be available should additional discharge needs arise or disposition change.    Expected Discharge Date:   (unknown)               Expected Discharge Plan:  [place]  In-House Referral:  NA  Discharge planning Services  CM Consult  Post Acute Care Choice:  Resumption of Svcs/PTA Provider Choice offered to:  Patient, Adult Children Zachery Dauer )  DME Arranged:  N/A (Has Hospital Bed, W/C ,  Hoyer lift and Continuous Oxygen) DME Agency:  [place]:  NA HH Agency:  NA  Status of Service:  Completed, signed off  If discussed at [place] of Stay Meetings, dates discussed:    Additional Comments:  Delrae Sawyers, RN 06/10/2016, 9:52 AM

## 2016-06-11 ENCOUNTER — Inpatient Hospital Stay (HOSPITAL_COMMUNITY): Payer: Medicare Other

## 2016-06-11 DIAGNOSIS — R609 Edema, unspecified: Secondary | ICD-10-CM

## 2016-06-11 LAB — GLUCOSE, CAPILLARY
GLUCOSE-CAPILLARY: 187 mg/dL — AB (ref 65–99)
Glucose-Capillary: 246 mg/dL — ABNORMAL HIGH (ref 65–99)
Glucose-Capillary: 253 mg/dL — ABNORMAL HIGH (ref 65–99)

## 2016-06-11 LAB — HEMOGLOBIN A1C
HEMOGLOBIN A1C: 9.4 % — AB (ref 4.8–5.6)
Mean Plasma Glucose: 223 mg/dL

## 2016-06-11 LAB — CBC
HEMATOCRIT: 35.3 % — AB (ref 36.0–46.0)
HEMOGLOBIN: 10.8 g/dL — AB (ref 12.0–15.0)
MCH: 29.9 pg (ref 26.0–34.0)
MCHC: 30.6 g/dL (ref 30.0–36.0)
MCV: 97.8 fL (ref 78.0–100.0)
Platelets: 104 10*3/uL — ABNORMAL LOW (ref 150–400)
RBC: 3.61 MIL/uL — ABNORMAL LOW (ref 3.87–5.11)
RDW: 14.1 % (ref 11.5–15.5)
WBC: 4.6 10*3/uL (ref 4.0–10.5)

## 2016-06-11 LAB — BASIC METABOLIC PANEL
ANION GAP: 5 (ref 5–15)
BUN: 24 mg/dL — ABNORMAL HIGH (ref 6–20)
CALCIUM: 8.2 mg/dL — AB (ref 8.9–10.3)
CO2: 32 mmol/L (ref 22–32)
Chloride: 100 mmol/L — ABNORMAL LOW (ref 101–111)
Creatinine, Ser: 1.08 mg/dL — ABNORMAL HIGH (ref 0.44–1.00)
GFR, EST AFRICAN AMERICAN: 52 mL/min — AB (ref >60)
GFR, EST NON AFRICAN AMERICAN: 45 mL/min — AB (ref >60)
Glucose, Bld: 230 mg/dL — ABNORMAL HIGH (ref 65–99)
Potassium: 4.9 mmol/L (ref 3.5–5.1)
SODIUM: 137 mmol/L (ref 135–145)

## 2016-06-11 LAB — LEGIONELLA PNEUMOPHILA SEROGP 1 UR AG: L. PNEUMOPHILA SEROGP 1 UR AG: NEGATIVE

## 2016-06-11 MED ORDER — GUAIFENESIN ER 600 MG PO TB12: 1200.0000 mg | ORAL_TABLET | Freq: Two times a day (BID) | ORAL | 0 refills | Status: DC

## 2016-06-11 MED ORDER — AZITHROMYCIN 250 MG PO TABS
500.0000 mg | ORAL_TABLET | Freq: Once | ORAL | Status: AC
  Administered 2016-06-11: 500 mg via ORAL
  Filled 2016-06-11: qty 2

## 2016-06-11 MED ORDER — POLYETHYLENE GLYCOL 3350 17 G PO PACK: 17.0000 g | PACK | Freq: Every day | ORAL | 0 refills | Status: AC | PRN

## 2016-06-11 MED ORDER — AZITHROMYCIN 500 MG PO TABS: 500.0000 mg | ORAL_TABLET | Freq: Every day | ORAL | 0 refills | Status: AC

## 2016-06-11 MED ORDER — CEFDINIR 300 MG PO CAPS: 300.0000 mg | ORAL_CAPSULE | Freq: Two times a day (BID) | ORAL | 0 refills | Status: DC

## 2016-06-11 NOTE — Progress Notes (Signed)
*  PRELIMINARY RESULTS* Vascular Ultrasound Lower Extremity Arterial Doppler has been completed.  Preliminary findings: Bilateral ankle brachial indices indicate no peripheral arterial disease however lower extremity waveforms are biphasic and monophasic.  Rut Betterton C Boaz Berisha 06/11/2016, 10:00 AM

## 2016-06-11 NOTE — Progress Notes (Signed)
*  PRELIMINARY RESULTS* Vascular Ultrasound Bilateral lower extremity venous duplex has been completed.  Preliminary findings: No evidence of deep vein thrombosis in the visualized veins of the lower extremities.  Technically limited exam due to patient body habitus and edema. No obvious baker's cysts bilaterally.   Everrett Coombe 06/11/2016, 9:53 AM

## 2016-06-11 NOTE — Progress Notes (Signed)
I spoke with [name],dughter re: d/c instructions, verbalize understanding. Also her daughter asked this Probation officer to clarify re: stopping the glimeperide, Dr. Lonny Prude made aware. All; questions answered appropriately. I called [facility] to transport patient home.

## 2016-06-11 NOTE — Discharge Instructions (Addendum)
[facility] P Bon,  You were admitted to the hospital because of community acquired pneumonia. You have been treated with antibiotics and will continue while outside the hospital. You were also treated for a urinary tract infection. Please follow-up with your primary care physician.   Community-Acquired Pneumonia, Adult Introduction Pneumonia is an infection of the lungs. One type of pneumonia can happen while a person is in a hospital. A different type can happen when a person is not in a hospital (community-acquired pneumonia). It is easy for this kind to spread from person to person. It can spread to you if you breathe near an infected person who coughs or sneezes. Some symptoms include:  A dry cough.  A wet (productive) cough.  Fever.  Sweating.  Chest pain. Follow these instructions at home:  Take over-the-counter and prescription medicines only as told by your doctor.  Only take cough medicine if you are losing sleep.  If you were prescribed an antibiotic medicine, take it as told by your doctor. Do not stop taking the antibiotic even if you start to feel better.  Sleep with your head and neck raised (elevated). You can do this by putting a few pillows under your head, or you can sleep in a recliner.  Do not use tobacco products. These include cigarettes, chewing tobacco, and e-cigarettes. If you need help quitting, ask your doctor.  Drink enough water to keep your pee (urine) clear or pale yellow. A shot (vaccine) can help prevent pneumonia. Shots are often suggested for:  People older than 81 years of age.  People older than 81 years of age:  Who are having cancer treatment.  Who have long-term (chronic) lung disease.  Who have problems with their body's defense system (immune system). You may also prevent pneumonia if you take these actions:  Get the flu (influenza) shot every year.  Go to the dentist as often as told.  Wash your hands often. If soap and water  are not available, use hand sanitizer. Contact a doctor if:  You have a fever.  You lose sleep because your cough medicine does not help. Get help right away if:  You are short of breath and it gets worse.  You have more chest pain.  Your sickness gets worse. This is very serious if:  You are an older adult.  Your body's defense system is weak.  You cough up blood. This information is not intended to replace advice given to you by your health care provider. Make sure you discuss any questions you have with your health care provider. Document Released: 10/07/2007 Document Revised: 09/26/2015 Document Reviewed: 08/15/2014  2017 [employer]

## 2016-06-11 NOTE — Discharge Summary (Signed)
Physician Discharge Summary  Elizabeth Pugh OJJ:[id number] DOB: 01-23-1927 DOA: 06/09/2016  PCP: Philis Fendt, MD  Admit date: 06/09/2016 Discharge date: 06/12/2016  Admitted From: Home Disposition:  Home  Recommendations for Outpatient Follow-up:  1. Follow up with PCP in 1 week 2. Please obtain BMP to recheck creatinine 3. Please obtain CBC to recheck platelets 4. Please follow up on the following pending results: Blood culture   Discharge Condition: Stable CODE STATUS: Full code  Brief/Interim Summary:  HPI Elizabeth Dumas, MD on 06/09/2016  Chief Complaint: *Shortness of breath and fever   HPI:  83 year old female with a history of chronic atrial fibrillation on anticoagulation, chronic diastolic CHF, history of CVA, diabetes mellitus type 2and possible OSA was brought to the ER after patient had sudden onset of fever and chills. Patient's granddaughter provides most of the history. She states that the patient has had a cough on and off for the last 1-2 weeks. Yesterday the patient felt warm and attempted to take off her clothes, saying that she "could not breathe and take in a deep breath". Granddaughter checked   axillary temperature that was close to 101F. She also appeared to have labored breathing despite being on her home oxygen. She subsequently had one episode of blood-tinged sputum, and family decided to bring her to the ER for further evaluation via EMS. Patient has also experienced dysuria for the last week. She denies any chest pain. At baseline patient is mostly bedbound because of arthritis and prior CVA neck line in the ER patient was found to have a rectal temperature 103.2, pulse Pulse between 80 and 100, telemetry showing chronic A. fib. Oxygen saturation 94% on 2 L Chest x-ray consistent with community-acquired pneumonia Sepsis workup was initiated. Patient admitted to telemetry for further evaluation   Hospital course:  Community acquired pneumonia Initially  met sepsis criteria although has chronic atrial fibrillation. Empirically started on ceftriaxone and azithromycin. Symptoms improved. Urine strep pneumo antigen negative. Influenza negative. Transitioned to cefdinir and azithromycin at discharge. Sputum culture negative. Sputum gram stain significant for rare gram positive cocci and gram negative rods. Respiratory virus panel negative.  Chronic atrial fibrillation CHA2DS2-VASc Scoreis 8. Continued apixaban and Coreg  Chronic diastolic heart failure Stable. Compensated. Unknown dry weight. Continued Lasix and Coreg.  Lower extremity edema Chronic. LE duplex negative for DVT  Decreased pedal pulses ABIs within normal limits, although waveforms are biphasic and monophasic  UTI Patient with symptoms of dysuria. Urinalysis not very suggestive. Urine culture not obtained. Ceftriaxone given empirically and transitioned to cefdinir at discharge.  Diabetes mellitus, type II Patient on Lantus as an outpatient in addition to NovoLog. She is also on oral hypoglycemics. Continued lantus. Given sliding scale. Discontinued glimepiride at discharge. Continued Lantus and sliding scale.  Seizure disorder Stable. Continued Keppra  Restless leg syndrome Continued Requip  Thrombocytopenia Likely secondary to acute illness. Follow-up as outpatient.   Discharge Diagnoses:  Principal Problem:   CAP (community acquired pneumonia) Active Problems:   DM (diabetes mellitus), type 2, uncontrolled with complications ([place])   CVA, old, hemiparesis ([place])   Hypertension   Chronic atrial fibrillation (HCC)   Seizure disorder ([place])   History of pulmonary embolism   Acute respiratory failure with hypoxia (HCC)   SIRS (systemic inflammatory response syndrome) ([place])    Discharge Instructions   Allergies as of 06/11/2016      Reactions   Erythromycin Other (See Comments)   Vaginal itching   Penicillins Rash, Other (See Comments)  Has patient had  a PCN reaction causing immediate rash, facial/tongue/throat swelling, SOB or lightheadedness with hypotension: No Has patient had a PCN reaction causing severe rash involving mucus membranes or skin necrosis: No Has patient had a PCN reaction that required hospitalization: No Has patient had a PCN reaction occurring within the last 10 years: No If all of the above answers are "NO", then may proceed with Cephalosporin use.   Zithromax [azithromycin] Other (See Comments)   gave her a yeast infection.      Medication List    STOP taking these medications   glimepiride 2 MG tablet Commonly known as:  AMARYL   potassium chloride SA 20 MEQ tablet Commonly known as:  K-DUR,KLOR-CON     TAKE these medications   albuterol 108 (90 Base) MCG/ACT inhaler Commonly known as:  PROVENTIL HFA;VENTOLIN HFA Inhale 1 puff into the lungs every 6 (six) hours as needed for wheezing or shortness of breath.   ALLERGY EYE OP Place 1 drop into both eyes as needed (For eye irritation.).   ALPRAZolam 0.25 MG tablet Commonly known as:  XANAX Take 1 tablet (0.25 mg total) by mouth 2 (two) times daily as needed for anxiety.   apixaban 5 MG Tabs tablet Commonly known as:  ELIQUIS Take 1 tablet (5 mg total) by mouth 2 (two) times daily.   azithromycin 500 MG tablet Commonly known as:  ZITHROMAX Take 1 tablet (500 mg total) by mouth daily.   CALCIUM 500+D 500-400 MG-UNIT tablet Generic drug:  calcium-vitamin D Take 1 tablet by mouth 2 (two) times daily.   carvedilol 3.125 MG tablet Commonly known as:  COREG Take 1 tablet (3.125 mg total) by mouth 2 (two) times daily with a meal.   cefdinir 300 MG capsule Commonly known as:  OMNICEF Take 1 capsule (300 mg total) by mouth 2 (two) times daily.   ferrous sulfate 325 (65 FE) MG tablet Take 325 mg by mouth daily with supper.   furosemide 40 MG tablet Commonly known as:  LASIX Take 40 mg by mouth 2 (two) times daily.   guaiFENesin 600 MG 12 hr  tablet Commonly known as:  MUCINEX Take 2 tablets (1,200 mg total) by mouth 2 (two) times daily.   hydrOXYzine 25 MG tablet Commonly known as:  ATARAX/VISTARIL Take 25 mg by mouth every 8 (eight) hours as needed for itching.   insulin aspart 100 UNIT/ML injection Commonly known as:  novoLOG Inject 0-12 Units into the skin as needed for high blood sugar. Give if blood sugar is greater than 150.   insulin glargine 100 UNIT/ML injection Commonly known as:  LANTUS Inject 0.15 mLs (15 Units total) into the skin at bedtime. What changed:  how much to take   ipratropium-albuterol 0.5-2.5 (3) MG/3ML Soln Commonly known as:  DUONEB Take 3 mLs by nebulization every 6 (six) hours as needed. What changed:  reasons to take this   levETIRAcetam 500 MG tablet Commonly known as:  KEPPRA Take 1 tablet (500 mg total) by mouth 2 (two) times daily.   pantoprazole 40 MG tablet Commonly known as:  PROTONIX Take 1 tablet (40 mg total) by mouth 2 (two) times daily before a meal.   polyethylene glycol packet Commonly known as:  MIRALAX / GLYCOLAX Take 17 g by mouth daily as needed for mild constipation.   pregabalin 75 MG capsule Commonly known as:  LYRICA Take 75 mg by mouth at bedtime.   rOPINIRole 2 MG tablet Commonly known as:  REQUIP  Take 1 tablet (2 mg total) by mouth 3 (three) times daily.   STOOL SOFTENER PO Take 1 capsule by mouth daily as needed (For constipation.).   Vitamin D (Ergocalciferol) 50000 units Caps capsule Commonly known as:  DRISDOL Take 50,000 Units by mouth every Saturday. No specific day   VOLTAREN 1 % Gel Generic drug:  diclofenac sodium Apply 1 application topically 4 (four) times daily as needed (For knee pain.).      Follow-up [facility].   Why:  Will continue to provide Oxygen Contact information: [address] [address] [phone]        Philis Fendt, MD. Schedule an appointment  as soon as possible for a visit in 1 week(s).   Specialty:  Internal Medicine Contact information: [place] [address] [facility] Follow up.   Specialty:  Vascular Surgery Contact information: [place] [id number] [place] [address] [phone]         Allergies  Allergen Reactions  . Erythromycin Other (See Comments)    Vaginal itching  . Penicillins Rash and Other (See Comments)    Has patient had a PCN reaction causing immediate rash, facial/tongue/throat swelling, SOB or lightheadedness with hypotension: No Has patient had a PCN reaction causing severe rash involving mucus membranes or skin necrosis: No Has patient had a PCN reaction that required hospitalization: No Has patient had a PCN reaction occurring within the last 10 years: No If all of the above answers are "NO", then may proceed with Cephalosporin use.  Marland Kitchen Zithromax [Azithromycin] Other (See Comments)    gave her a yeast infection.    Consultations:  None   Procedures/Studies: Dg Chest 2 View  Result Date: 06/09/2016 CLINICAL DATA:  Fever and cough EXAM: CHEST  2 VIEW COMPARISON:  December 11, 2015 FINDINGS: There is consolidation in the posterior left base region. Lungs elsewhere clear. Heart is mildly enlarged. The pulmonary vascularity has a pattern suggesting a degree of pulmonary arterial hypertension. No adenopathy evident. No bone lesions. IMPRESSION: Posterior left base consolidation consistent with pneumonia. Mild cardiomegaly. Suspect a degree of pulmonary arterial hypertension. Followup PA and lateral chest radiographs recommended in 3-4 weeks following trial of antibiotic therapy to ensure resolution and exclude underlying malignancy. Electronically Signed   By: Lowella Grip III M.D.   On: 06/09/2016 15:09    Subjective: No dyspnea or chest pain.  Discharge Exam: Vitals:    06/11/16 1400 06/11/16 1647  BP: 129/88 122/89  Pulse: 92 78  Resp: 16   Temp: 97.7 F (36.5 C)    Vitals:   06/10/16 2111 06/11/16 0405 06/11/16 1400 06/11/16 1647  BP: 100/75 139/83 129/88 122/89  Pulse: 77 86 92 78  Resp: '16 16 16   ' Temp: 97.7 F (36.5 C) 98.2 F (36.8 C) 97.7 F (36.5 C)   TempSrc: Oral Oral Oral   SpO2: 100% 100% 98%     General exam: Appears calm and comfortable Respiratory system: Clear to anterior auscultation however diminished. Respiratory effort normal. Cardiovascular system: S1 & S2 heard, RRR. No murmurs. Cannot feel pedal pulses bilaterally Gastrointestinal system: Abdomen is nondistended, soft and nontender. No organomegaly or masses felt. Normal bowel sounds heard. Central nervous system: Alert and oriented. No focal neurological deficits. Extremities: significant pitting edema in lower extremities. No calf tenderness Skin: No cyanosis. No rashes Psychiatry:  Judgement and insight appear normal. Mood & affect appropriate.    The results of significant diagnostics from this hospitalization (including imaging, microbiology, ancillary and laboratory) are listed below for reference.     Microbiology: Recent Results (from the past 240 hour(s))  Blood culture (routine x 2)     Status: None (Preliminary result)   Collection Time: 06/09/16  2:22 PM  Result Value Ref Range Status   Specimen Description BLOOD LEFT ARM  Final   Special Requests BOTTLES DRAWN AEROBIC AND ANAEROBIC 5 CC EA  Final   Culture   Final    NO GROWTH 3 DAYS Performed at [facility], [address], [place], [place] [address]    Report Status PENDING  Incomplete  Blood culture (routine x 2)     Status: None (Preliminary result)   Collection Time: 06/09/16  2:22 PM  Result Value Ref Range Status   Specimen Description BLOOD RIGHT ARM  Final   Special Requests IN PEDIATRIC BOTTLE  Final   Culture   Final    NO GROWTH 3 DAYS Performed at [facility], [place] [address], [place], [place] [address]    Report Status PENDING  Incomplete  Culture, blood (routine x 2) Call MD if unable to obtain prior to antibiotics being given     Status: None (Preliminary result)   Collection Time: 06/09/16  8:13 PM  Result Value Ref Range Status   Specimen Description BLOOD LEFT HAND  Final   Special Requests BOTTLES DRAWN AEROBIC ONLY 5CC  Final   Culture   Final    NO GROWTH 3 DAYS Performed at [facility], [place] [address] [place], [place] [address]    Report Status PENDING  Incomplete  Respiratory Panel by PCR     Status: None   Collection Time: 06/09/16  9:54 PM  Result Value Ref Range Status   Adenovirus NOT DETECTED NOT DETECTED Final   Coronavirus 229E NOT DETECTED NOT DETECTED Final   Coronavirus HKU1 NOT DETECTED NOT DETECTED Final   Coronavirus NL63 NOT DETECTED NOT DETECTED Final   Coronavirus OC43 NOT DETECTED NOT DETECTED Final   Metapneumovirus NOT DETECTED NOT DETECTED Final   Rhinovirus / Enterovirus NOT DETECTED NOT DETECTED Final   Influenza A NOT DETECTED NOT DETECTED Final   Influenza B NOT DETECTED NOT DETECTED Final   Parainfluenza Virus 1 NOT DETECTED NOT DETECTED Final   Parainfluenza Virus 2 NOT DETECTED NOT DETECTED Final   Parainfluenza Virus 3 NOT DETECTED NOT DETECTED Final   Parainfluenza Virus 4 NOT DETECTED NOT DETECTED Final   Respiratory Syncytial Virus NOT DETECTED NOT DETECTED Final   Bordetella pertussis NOT DETECTED NOT DETECTED Final   Chlamydophila pneumoniae NOT DETECTED NOT DETECTED Final   Mycoplasma pneumoniae NOT DETECTED NOT DETECTED Final    Comment: Performed at [facility], [place] [address] [place], [place] [address]  Culture, sputum-assessment     Status: None   Collection Time: 06/10/16  8:59 AM  Result Value Ref Range Status   Specimen Description SPUTUM  Final   Special Requests NONE  Final   Sputum evaluation THIS SPECIMEN IS ACCEPTABLE FOR SPUTUM CULTURE  Final   Report Status 06/10/2016  FINAL  Final  Culture, respiratory (NON-Expectorated)     Status: None   Collection Time: 06/10/16  8:59 AM  Result Value Ref Range Status   Specimen Description SPUTUM  Final   Special Requests NONE Reflexed from [id number]  Final   Gram Stain   Final    FEW WBC PRESENT,BOTH PMN AND MONONUCLEAR RARE GRAM POSITIVE COCCI RARE GRAM NEGATIVE RODS    Culture   Final    Consistent with normal respiratory flora. Performed at [facility], [place] [address] [place], [place] [address]    Report Status 06/12/2016 FINAL  Final     Labs: BNP (last 3 results)  Recent Labs  09/22/15 1513 09/25/15 0440 12/11/15 1831  BNP 173.0* 131.7* 917.9*   Basic Metabolic Panel:  Recent Labs Lab 06/09/16 1421 06/09/16 1802 06/10/16 0411 06/11/16 0422  NA 139  --  139 137  K 5.5*  --  4.7 4.9  CL 98*  --  101 100*  CO2 33*  --  34* 32  GLUCOSE 229*  --  261* 230*  BUN 26*  --  28* 24*  CREATININE 1.16*  --  1.11* 1.08*  CALCIUM 9.3  --  8.5* 8.2*  MG  --  1.7  --   --    Liver Function Tests:  Recent Labs Lab 06/09/16 1421 06/10/16 0411  AST 15 14*  ALT 10* 11*  ALKPHOS 58 40  BILITOT 0.9 1.2  PROT 6.8 5.9*  ALBUMIN 3.7 2.9*   No results for input(s): LIPASE, AMYLASE in the last 168 hours. No results for input(s): AMMONIA in the last 168 hours. CBC:  Recent Labs Lab 06/09/16 1421 06/10/16 0411 06/11/16 0449  WBC 5.0 6.4 4.6  NEUTROABS 3.5  --   --   HGB 13.1 10.2* 10.8*  HCT 42.6 33.3* 35.3*  MCV 98.6 95.4 97.8  PLT 133* 115* 104*   Cardiac Enzymes:  Recent Labs Lab 06/09/16 1802 06/09/16 2232  TROPONINI 0.03* <0.03   BNP: Invalid input(s): POCBNP CBG:  Recent Labs Lab 06/10/16 1803 06/10/16 2108 06/11/16 0717 06/11/16 1209 06/11/16 1801  GLUCAP 272* 229* 187* 246* 253*   D-Dimer No results for input(s): DDIMER in the last 72 hours. Hgb A1c No results for input(s): HGBA1C in the last 72 hours. Lipid Profile No results for input(s): CHOL, HDL,  LDLCALC, TRIG, CHOLHDL, LDLDIRECT in the last 72 hours. Thyroid function studies  Recent Labs  06/09/16 1802  TSH 1.051   Anemia work up No results for input(s): VITAMINB12, FOLATE, FERRITIN, TIBC, IRON, RETICCTPCT in the last 72 hours. Urinalysis    Component Value Date/Time   COLORURINE YELLOW 06/09/2016 1400   APPEARANCEUR HAZY (A) 06/09/2016 1400   LABSPEC 1.020 06/09/2016 1400   PHURINE 5.0 06/09/2016 1400   GLUCOSEU 50 (A) 06/09/2016 1400   HGBUR SMALL (A) 06/09/2016 1400   BILIRUBINUR NEGATIVE 06/09/2016 1400   KETONESUR 5 (A) 06/09/2016 1400   PROTEINUR 30 (A) 06/09/2016 1400   UROBILINOGEN 1.0 01/19/2015 2241   NITRITE NEGATIVE 06/09/2016 1400   LEUKOCYTESUR MODERATE (A) 06/09/2016 1400   Sepsis Labs Invalid input(s): PROCALCITONIN,  WBC,  LACTICIDVEN Microbiology Recent Results (from the past 240 hour(s))  Blood culture (routine x 2)     Status: None (Preliminary result)   Collection Time: 06/09/16  2:22 PM  Result Value Ref Range Status   Specimen Description BLOOD LEFT ARM  Final   Special Requests BOTTLES DRAWN AEROBIC AND ANAEROBIC 5 CC EA  Final   Culture   Final    NO GROWTH 3 DAYS Performed at [facility], [place] [address], [place], [place] [address]    Report Status PENDING  Incomplete  Blood culture (routine x 2)  Status: None (Preliminary result)   Collection Time: 06/09/16  2:22 PM  Result Value Ref Range Status   Specimen Description BLOOD RIGHT ARM  Final   Special Requests IN PEDIATRIC BOTTLE  Final   Culture   Final    NO GROWTH 3 DAYS Performed at [facility], [place] [address], [place], [place] [address]    Report Status PENDING  Incomplete  Culture, blood (routine x 2) Call MD if unable to obtain prior to antibiotics being given     Status: None (Preliminary result)   Collection Time: 06/09/16  8:13 PM  Result Value Ref Range Status   Specimen Description BLOOD LEFT HAND  Final   Special Requests BOTTLES DRAWN AEROBIC ONLY  5CC  Final   Culture   Final    NO GROWTH 3 DAYS Performed at [facility], [place] [address] [place], [place] [address]    Report Status PENDING  Incomplete  Respiratory Panel by PCR     Status: None   Collection Time: 06/09/16  9:54 PM  Result Value Ref Range Status   Adenovirus NOT DETECTED NOT DETECTED Final   Coronavirus 229E NOT DETECTED NOT DETECTED Final   Coronavirus HKU1 NOT DETECTED NOT DETECTED Final   Coronavirus NL63 NOT DETECTED NOT DETECTED Final   Coronavirus OC43 NOT DETECTED NOT DETECTED Final   Metapneumovirus NOT DETECTED NOT DETECTED Final   Rhinovirus / Enterovirus NOT DETECTED NOT DETECTED Final   Influenza A NOT DETECTED NOT DETECTED Final   Influenza B NOT DETECTED NOT DETECTED Final   Parainfluenza Virus 1 NOT DETECTED NOT DETECTED Final   Parainfluenza Virus 2 NOT DETECTED NOT DETECTED Final   Parainfluenza Virus 3 NOT DETECTED NOT DETECTED Final   Parainfluenza Virus 4 NOT DETECTED NOT DETECTED Final   Respiratory Syncytial Virus NOT DETECTED NOT DETECTED Final   Bordetella pertussis NOT DETECTED NOT DETECTED Final   Chlamydophila pneumoniae NOT DETECTED NOT DETECTED Final   Mycoplasma pneumoniae NOT DETECTED NOT DETECTED Final    Comment: Performed at [facility], [place] [address] [place], [place] [address]  Culture, sputum-assessment     Status: None   Collection Time: 06/10/16  8:59 AM  Result Value Ref Range Status   Specimen Description SPUTUM  Final   Special Requests NONE  Final   Sputum evaluation THIS SPECIMEN IS ACCEPTABLE FOR SPUTUM CULTURE  Final   Report Status 06/10/2016 FINAL  Final  Culture, respiratory (NON-Expectorated)     Status: None   Collection Time: 06/10/16  8:59 AM  Result Value Ref Range Status   Specimen Description SPUTUM  Final   Special Requests NONE Reflexed from [id number]  Final   Gram Stain   Final    FEW WBC PRESENT,BOTH PMN AND MONONUCLEAR RARE GRAM POSITIVE COCCI RARE GRAM NEGATIVE RODS    Culture    Final    Consistent with normal respiratory flora. Performed at [facility], [place] [address] [place],  [address]    Report Status 06/12/2016 FINAL  Final     Time coordinating discharge: Over 30 minutes  SIGNED:   Cordelia Poche, MD [facility] 06/12/2016, 5:27 PM Pager [phone]  If 7PM-7AM, please contact night-coverage [email or web address] Password [email or web address]

## 2016-06-11 NOTE — Progress Notes (Signed)
PT Cancellation Note  Patient Details Name: Elizabeth Pugh MRN: [id number] DOB: 1926/05/17   Cancelled Treatment:    Reason Eval/Treat Not Completed: PT screened, no needs identified, will sign off (per  CM notes and previous PT notes from another encounter, the patient is bed bound, OOB via  [facility]. )   Berdine Addison, Shella Maxim 06/11/2016, 8:12 AM Tresa Endo PT [phone]

## 2016-06-11 NOTE — Progress Notes (Signed)
Patient d/c home via PTAR. Stable. 

## 2016-06-11 NOTE — Progress Notes (Signed)
Inpatient Diabetes Program Recommendations  AACE/ADA: New Consensus Statement on Inpatient Glycemic Control (2015)  Target Ranges:  Prepandial:   less than 140 mg/dL      Peak postprandial:   less than 180 mg/dL (1-2 hours)      Critically ill patients:  140 - 180 mg/dL   Results for Elizabeth Pugh, Elizabeth Pugh (MRN [id number]) as of 06/11/2016 09:50  Ref. Range 06/10/2016 07:59 06/10/2016 12:47 06/10/2016 18:03 06/10/2016 21:08  Glucose-Capillary Latest Ref Range: 65 - 99 mg/dL 205 (H) 317 (H) 272 (H) 229 (H)   Results for Elizabeth Pugh, Elizabeth Pugh (MRN [id number]) as of 06/11/2016 09:50  Ref. Range 06/11/2016 07:17  Glucose-Capillary Latest Ref Range: 65 - 99 mg/dL 187 (H)    Admit with: SIRS/ Pneumonia/ UTI  History: DM, CHF, CVA  Home DM Meds: Lantus 30 units QHS                             Novolog 0-12 units for CBG >150 mg/dl                             Amaryl 2 mg daily  Current Insulin Orders: Lantus 22 units QHS                                       Novolog Sensitive Correction Scale/ SSI (0-9 units) TID AC      MD- Please consider the following in-hospital insulin adjustments:  1. Increase Lantus to 30 units QHS (home dose)  2. Start Novolog Meal Coverage: Novolog 6 units TID with meals (hold if pt eats <50% of meal)  Patient with good appetite.  Eating 100% meals yesterday per documentation.  Fasting glucose still mildly elevated and also having elevated post-prandial glucose levels.    --Will follow patient during hospitalization--  Wyn Quaker RN, MSN, CDE Diabetes Coordinator Inpatient Glycemic Control Team Team Pager: [phone] (8a-5p)

## 2016-06-12 LAB — CULTURE, RESPIRATORY: CULTURE: NORMAL

## 2016-06-14 LAB — CULTURE, BLOOD (ROUTINE X 2)
CULTURE: NO GROWTH
Culture: NO GROWTH
Culture: NO GROWTH

## 2016-06-15 ENCOUNTER — Emergency Department (HOSPITAL_COMMUNITY): Payer: Medicare Other

## 2016-06-15 ENCOUNTER — Inpatient Hospital Stay (HOSPITAL_COMMUNITY)
Admission: EM | Admit: 2016-06-15 | Discharge: 2016-06-23 | DRG: 291 | Disposition: A | Discharge status: Skilled Nursing Facility | Payer: Medicare Other | Attending: Internal Medicine | Admitting: Internal Medicine

## 2016-06-15 DIAGNOSIS — J9601 Acute respiratory failure with hypoxia: Secondary | ICD-10-CM | POA: Diagnosis present

## 2016-06-15 DIAGNOSIS — I482 Chronic atrial fibrillation, unspecified: Secondary | ICD-10-CM | POA: Diagnosis present

## 2016-06-15 DIAGNOSIS — J9602 Acute respiratory failure with hypercapnia: Secondary | ICD-10-CM | POA: Diagnosis present

## 2016-06-15 DIAGNOSIS — Z794 Long term (current) use of insulin: Secondary | ICD-10-CM

## 2016-06-15 DIAGNOSIS — G9341 Metabolic encephalopathy: Secondary | ICD-10-CM | POA: Diagnosis present

## 2016-06-15 DIAGNOSIS — E875 Hyperkalemia: Secondary | ICD-10-CM | POA: Diagnosis present

## 2016-06-15 DIAGNOSIS — G4730 Sleep apnea, unspecified: Secondary | ICD-10-CM | POA: Diagnosis present

## 2016-06-15 DIAGNOSIS — E662 Morbid (severe) obesity with alveolar hypoventilation: Secondary | ICD-10-CM | POA: Diagnosis present

## 2016-06-15 DIAGNOSIS — Z6841 Body Mass Index (BMI) 40.0 and over, adult: Secondary | ICD-10-CM

## 2016-06-15 DIAGNOSIS — Z881 Allergy status to other antibiotic agents status: Secondary | ICD-10-CM

## 2016-06-15 DIAGNOSIS — I69359 Hemiplegia and hemiparesis following cerebral infarction affecting unspecified side: Secondary | ICD-10-CM

## 2016-06-15 DIAGNOSIS — Z961 Presence of intraocular lens: Secondary | ICD-10-CM | POA: Diagnosis present

## 2016-06-15 DIAGNOSIS — Y95 Nosocomial condition: Secondary | ICD-10-CM | POA: Diagnosis present

## 2016-06-15 DIAGNOSIS — Z87891 Personal history of nicotine dependence: Secondary | ICD-10-CM

## 2016-06-15 DIAGNOSIS — Z825 Family history of asthma and other chronic lower respiratory diseases: Secondary | ICD-10-CM

## 2016-06-15 DIAGNOSIS — Z841 Family history of disorders of kidney and ureter: Secondary | ICD-10-CM

## 2016-06-15 DIAGNOSIS — Z9841 Cataract extraction status, right eye: Secondary | ICD-10-CM

## 2016-06-15 DIAGNOSIS — E1165 Type 2 diabetes mellitus with hyperglycemia: Secondary | ICD-10-CM | POA: Diagnosis present

## 2016-06-15 DIAGNOSIS — Z8249 Family history of ischemic heart disease and other diseases of the circulatory system: Secondary | ICD-10-CM

## 2016-06-15 DIAGNOSIS — J9600 Acute respiratory failure, unspecified whether with hypoxia or hypercapnia: Secondary | ICD-10-CM | POA: Diagnosis present

## 2016-06-15 DIAGNOSIS — I447 Left bundle-branch block, unspecified: Secondary | ICD-10-CM | POA: Diagnosis present

## 2016-06-15 DIAGNOSIS — Z823 Family history of stroke: Secondary | ICD-10-CM

## 2016-06-15 DIAGNOSIS — D61818 Other pancytopenia: Secondary | ICD-10-CM | POA: Diagnosis present

## 2016-06-15 DIAGNOSIS — I890 Lymphedema, not elsewhere classified: Secondary | ICD-10-CM | POA: Diagnosis present

## 2016-06-15 DIAGNOSIS — R569 Unspecified convulsions: Secondary | ICD-10-CM | POA: Diagnosis present

## 2016-06-15 DIAGNOSIS — E872 Acidosis: Secondary | ICD-10-CM | POA: Diagnosis not present

## 2016-06-15 DIAGNOSIS — E8729 Other acidosis: Secondary | ICD-10-CM

## 2016-06-15 DIAGNOSIS — I481 Persistent atrial fibrillation: Secondary | ICD-10-CM | POA: Diagnosis present

## 2016-06-15 DIAGNOSIS — Z880 Allergy status to penicillin: Secondary | ICD-10-CM

## 2016-06-15 DIAGNOSIS — Z7901 Long term (current) use of anticoagulants: Secondary | ICD-10-CM

## 2016-06-15 DIAGNOSIS — G9340 Encephalopathy, unspecified: Secondary | ICD-10-CM | POA: Diagnosis present

## 2016-06-15 DIAGNOSIS — I5043 Acute on chronic combined systolic (congestive) and diastolic (congestive) heart failure: Secondary | ICD-10-CM | POA: Diagnosis present

## 2016-06-15 DIAGNOSIS — K219 Gastro-esophageal reflux disease without esophagitis: Secondary | ICD-10-CM | POA: Diagnosis present

## 2016-06-15 DIAGNOSIS — I110 Hypertensive heart disease with heart failure: Secondary | ICD-10-CM | POA: Diagnosis not present

## 2016-06-15 DIAGNOSIS — IMO0002 Reserved for concepts with insufficient information to code with codable children: Secondary | ICD-10-CM | POA: Diagnosis present

## 2016-06-15 DIAGNOSIS — I5042 Chronic combined systolic (congestive) and diastolic (congestive) heart failure: Secondary | ICD-10-CM | POA: Diagnosis present

## 2016-06-15 DIAGNOSIS — Z79899 Other long term (current) drug therapy: Secondary | ICD-10-CM

## 2016-06-15 DIAGNOSIS — N179 Acute kidney failure, unspecified: Secondary | ICD-10-CM | POA: Diagnosis present

## 2016-06-15 DIAGNOSIS — E118 Type 2 diabetes mellitus with unspecified complications: Secondary | ICD-10-CM | POA: Diagnosis present

## 2016-06-15 DIAGNOSIS — N390 Urinary tract infection, site not specified: Secondary | ICD-10-CM | POA: Diagnosis present

## 2016-06-15 DIAGNOSIS — Z9981 Dependence on supplemental oxygen: Secondary | ICD-10-CM

## 2016-06-15 DIAGNOSIS — R4182 Altered mental status, unspecified: Secondary | ICD-10-CM

## 2016-06-15 DIAGNOSIS — J189 Pneumonia, unspecified organism: Secondary | ICD-10-CM

## 2016-06-15 LAB — CBC WITH DIFFERENTIAL/PLATELET
BASOS PCT: 0 %
Basophils Absolute: 0 10*3/uL (ref 0.0–0.1)
EOS ABS: 0.1 10*3/uL (ref 0.0–0.7)
Eosinophils Relative: 2 %
HCT: 35.1 % — ABNORMAL LOW (ref 36.0–46.0)
HEMOGLOBIN: 10.7 g/dL — AB (ref 12.0–15.0)
LYMPHS ABS: 1 10*3/uL (ref 0.7–4.0)
Lymphocytes Relative: 26 %
MCH: 30.3 pg (ref 26.0–34.0)
MCHC: 30.5 g/dL (ref 30.0–36.0)
MCV: 99.4 fL (ref 78.0–100.0)
Monocytes Absolute: 0.3 10*3/uL (ref 0.1–1.0)
Monocytes Relative: 8 %
Neutro Abs: 2.4 10*3/uL (ref 1.7–7.7)
Neutrophils Relative %: 64 %
Platelets: 156 10*3/uL (ref 150–400)
RBC: 3.53 MIL/uL — AB (ref 3.87–5.11)
RDW: 13.9 % (ref 11.5–15.5)
WBC: 3.8 10*3/uL — ABNORMAL LOW (ref 4.0–10.5)

## 2016-06-15 LAB — COMPREHENSIVE METABOLIC PANEL
ALT: 10 U/L — AB (ref 14–54)
AST: 11 U/L — ABNORMAL LOW (ref 15–41)
Albumin: 3.1 g/dL — ABNORMAL LOW (ref 3.5–5.0)
Alkaline Phosphatase: 45 U/L (ref 38–126)
Anion gap: 5 (ref 5–15)
BUN: 22 mg/dL — ABNORMAL HIGH (ref 6–20)
CHLORIDE: 102 mmol/L (ref 101–111)
CO2: 33 mmol/L — ABNORMAL HIGH (ref 22–32)
CREATININE: 1.15 mg/dL — AB (ref 0.44–1.00)
Calcium: 9.6 mg/dL (ref 8.9–10.3)
GFR, EST AFRICAN AMERICAN: 48 mL/min — AB (ref >60)
GFR, EST NON AFRICAN AMERICAN: 42 mL/min — AB (ref >60)
Glucose, Bld: 226 mg/dL — ABNORMAL HIGH (ref 65–99)
Potassium: 5.3 mmol/L — ABNORMAL HIGH (ref 3.5–5.1)
Sodium: 140 mmol/L (ref 135–145)
Total Bilirubin: 0.9 mg/dL (ref 0.3–1.2)
Total Protein: 6.1 g/dL — ABNORMAL LOW (ref 6.5–8.1)

## 2016-06-15 LAB — CBG MONITORING, ED: GLUCOSE-CAPILLARY: 209 mg/dL — AB (ref 65–99)

## 2016-06-15 LAB — I-STAT CG4 LACTIC ACID, ED: LACTIC ACID, VENOUS: 0.86 mmol/L (ref 0.5–1.9)

## 2016-06-15 LAB — TROPONIN I

## 2016-06-15 MED ORDER — SODIUM CHLORIDE 0.9 % IV BOLUS (SEPSIS)
1000.0000 mL | Freq: Once | INTRAVENOUS | Status: AC
  Administered 2016-06-15: 1000 mL via INTRAVENOUS

## 2016-06-15 NOTE — ED Provider Notes (Addendum)
MSE was initiated and I personally evaluated the patient and placed orders (if any) at  10:54 PM on June 15, 2016.  The patient appears stable so that the remainder of the MSE may be completed by another provider.  This elderly female presenting from home after recent admission for pneumonia, now with family concern of change in cognition.    Carmin Muskrat, MD 06/15/16 2254   EKG Interpretation  Date/Time:  Monday June 15 2016 22:43:46 EST Ventricular Rate:  95 PR Interval:    QRS Duration: 146 QT Interval:  392 QTC Calculation: 493 R Axis:   -26 Text Interpretation:  Atrial fibrillation Left bundle branch block Abnormal ekg Confirmed by Carmin Muskrat  MD ([id number]) on 06/15/2016 10:46:54 PM          Carmin Muskrat, MD 06/15/16 2254

## 2016-06-15 NOTE — ED Triage Notes (Signed)
Patient arrives via EMS from home. Family cares for patient at home themselves. Recently admitted for pneumonia and UTI with discharge 4 day ago. Family called ems tonight for AMS which they reported as starting at 56 tonight. Patient obtunded for EMS. Would respond to painful stimuli and occasionally calling of her name. EMS reported initial CBG 260, a recheck PTA by them was 191. Per EMS family gave 12 units aspart insulin around 2130. Upon arrival here, patient remains lethargic and minimally responsive to non-painful stimuli. Skin warm and damp.

## 2016-06-15 NOTE — ED Provider Notes (Signed)
By signing my name below, I, Avnee Patel, attest that this documentation has been prepared under the direction and in the presence of [place], DO  Electronically Signed: Delton [place], ED Scribe. 06/15/16. 11:35 PM.  TIME SEEN: 11:12 PM  CHIEF COMPLAINT:  Chief Complaint  Patient presents with  . Altered Mental Status   LEVEL 5 CAVEAT: HPI and ROS limited due to ALTERED MENTAL STATUS  HPI:   Elizabeth Pugh is a 83 y.o. female, with a hx of seizures on Keppra, CHF, DM, HTN and A-fib on Eliquis, who presents to the Emergency Department in altered mental status which began around 8 PM today. Granddaughter reports the pt was talking and was relatively normal at 7:30 PM today But she reports the patient has been "in and out of it" for the past couple of days. She reports the pt has been talking but has not been making any sense. Pt is currently on antibiotics and was recently discharged from the ED 4 days ago for pneumonia and a possible UTI. Granddaughter denies fevers, any recent falls, any recent head injuries, seizure-like activity or any other associated symptoms. Granddaughter notes the pt is on 2L oxygen at home.   ROS: LEVEL 5 CAVEAT DUE TO ALTERED MENTAL STATUS   PAST MEDICAL HISTORY/PAST SURGICAL HISTORY:  Past Medical History:  Diagnosis Date  . Acute delirium 04/12/2011  . Acute kidney injury ([place]) 09/02/2015  . Anemia   . Anxiety   . Arthritis   . Atrial fibrillation ([place])   . Blood transfusion   . Bronchitis   . CHF (congestive heart failure) ([place])   . Diabetes mellitus   . GERD (gastroesophageal reflux disease)   . GI (gastrointestinal bleed)    Due to benign mass  . Hypertension   . Lobar pneumonia due to unspecified organism 09/02/2015  . On home oxygen therapy    "4L; 24/7" (12/11/2015)  . Restless leg syndrome   . Seizures ([place])   . Stroke [facility])     MEDICATIONS:  Prior to Admission medications   Medication Sig Start Date End Date Taking? Authorizing  Provider  albuterol (PROVENTIL HFA;VENTOLIN HFA) 108 (90 BASE) MCG/ACT inhaler Inhale 1 puff into the lungs every 6 (six) hours as needed for wheezing or shortness of breath.    Historical Provider, MD  ALPRAZolam Duanne Moron) 0.25 MG tablet Take 1 tablet (0.25 mg total) by mouth 2 (two) times daily as needed for anxiety. 01/28/15   Geradine Girt, DO  apixaban (ELIQUIS) 5 MG TABS tablet Take 1 tablet (5 mg total) by mouth 2 (two) times daily. 09/27/15   Lavina Hamman, MD  calcium-vitamin D (CALCIUM 500+D) 500-400 MG-UNIT per tablet Take 1 tablet by mouth 2 (two) times daily.    Historical Provider, MD  carvedilol (COREG) 3.125 MG tablet Take 1 tablet (3.125 mg total) by mouth 2 (two) times daily with a meal. 10/01/15   Lavina Hamman, MD  cefdinir (OMNICEF) 300 MG capsule Take 1 capsule (300 mg total) by mouth 2 (two) times daily. 06/12/16 06/19/16  Mariel Aloe, MD  Docusate Calcium (STOOL SOFTENER PO) Take 1 capsule by mouth daily as needed (For constipation.).     Historical Provider, MD  ferrous sulfate 325 (65 FE) MG tablet Take 325 mg by mouth daily with supper.     Historical Provider, MD  furosemide (LASIX) 40 MG tablet Take 40 mg by mouth 2 (two) times daily.    Historical Provider, MD  guaiFENesin (  MUCINEX) 600 MG 12 hr tablet Take 2 tablets (1,200 mg total) by mouth 2 (two) times daily. 06/11/16   Mariel Aloe, MD  hydrOXYzine (ATARAX/VISTARIL) 25 MG tablet Take 25 mg by mouth every 8 (eight) hours as needed for itching.  05/10/16   Historical Provider, MD  insulin aspart (NOVOLOG) 100 UNIT/ML injection Inject 0-12 Units into the skin as needed for high blood sugar. Give if blood sugar is greater than 150.    Historical Provider, MD  insulin glargine (LANTUS) 100 UNIT/ML injection Inject 0.15 mLs (15 Units total) into the skin at bedtime. Patient taking differently: Inject 30 Units into the skin at bedtime.  09/27/15   Lavina Hamman, MD  ipratropium-albuterol (DUONEB) 0.5-2.5 (3) MG/3ML SOLN Take 3  mLs by nebulization every 6 (six) hours as needed. Patient taking differently: Take 3 mLs by nebulization every 6 (six) hours as needed (For shortness of breath.).  01/25/15   [name], MD  levETIRAcetam (KEPPRA) 500 MG tablet Take 1 tablet (500 mg total) by mouth 2 (two) times daily. 02/27/12   Monika Salk, MD  Naphazoline-Pheniramine (ALLERGY EYE OP) Place 1 drop into both eyes as needed (For eye irritation.).    Historical Provider, MD  pantoprazole (PROTONIX) 40 MG tablet Take 1 tablet (40 mg total) by mouth 2 (two) times daily before a meal. 10/01/15   Lavina Hamman, MD  polyethylene glycol (MIRALAX / Floria Raveling) packet Take 17 g by mouth daily as needed for mild constipation. 06/11/16   Mariel Aloe, MD  pregabalin (LYRICA) 75 MG capsule Take 75 mg by mouth at bedtime.     Historical Provider, MD  rOPINIRole (REQUIP) 2 MG tablet Take 1 tablet (2 mg total) by mouth 3 (three) times daily. 09/27/15   Lavina Hamman, MD  Vitamin D, Ergocalciferol, (DRISDOL) 50000 units CAPS capsule Take 50,000 Units by mouth every Saturday. No specific day     Historical Provider, MD  VOLTAREN 1 % GEL Apply 1 application topically 4 (four) times daily as needed (For knee pain.).  10/26/11   Historical Provider, MD    ALLERGIES:  Allergies  Allergen Reactions  . Erythromycin Other (See Comments)    Vaginal itching  . Penicillins Rash and Other (See Comments)    Has patient had a PCN reaction causing immediate rash, facial/tongue/throat swelling, SOB or lightheadedness with hypotension: No Has patient had a PCN reaction causing severe rash involving mucus membranes or skin necrosis: No Has patient had a PCN reaction that required hospitalization: No Has patient had a PCN reaction occurring within the last 10 years: No If all of the above answers are "NO", then may proceed with Cephalosporin use.  Marland Kitchen Zithromax [Azithromycin] Other (See Comments)    gave her a yeast infection.    SOCIAL HISTORY:  Social  History  Substance Use Topics  . Smoking status: Former Smoker    Packs/day: 1.00    Years: 40.00    Types: Cigarettes    Quit date: 09/21/1985  . Smokeless tobacco: Never Used  . Alcohol use No    FAMILY HISTORY: Family History  Problem Relation Age of Onset  . Aneurysm Mother   . Heart attack Father   . Heart failure Sister   . Asthma Sister   . Kidney failure Sister   . Stroke Sister     EXAM: BP 114/69 (BP Location: Right Arm)   Pulse 89   Temp 98.4 F (36.9 C) (Oral)   Resp  25   SpO2 99%  CONSTITUTIONAL: chronically ill-appearing, elderly, obese. Able to answer some questions. Drowsy but aroused by voice. Oriented x 1.   HEAD: Normocephalic, Atraumatic EYES: Conjunctivae clear, PERRL, EOMI ENT: normal nose; no rhinorrhea; moist mucous membranes NECK: Supple, no meningismus, no nuchal rigidity, no LAD  CARD: irregularly irregular, S1 and S2 appreciated; no murmurs, no clicks, no rubs, no gallops RESP: Normal chest excursion without splinting or tachypnea; breath sounds clear and equal bilaterally; no wheezes, no rhonchi, no rales, no hypoxia or respiratory distress, speaking full sentences ABD/GI: Normal bowel sounds; non-distended; soft, non-tender, no rebound, no guarding, no peritoneal signs, no hepatosplenomegaly BACK:  The back appears normal and is non-tender to palpation, there is no CVA tenderness EXT: Normal ROM in all joints; non-tender to palpation; no edema; normal capillary refill; no cyanosis, no calf tenderness or swelling    SKIN: Normal color for age and race; warm; no rash NEURO: Diminished movement of the left lower extremity which is chronic but otherwise moves other 3 extremities equally. Able to follow commands.  PSYCH: The patient's mood and manner are appropriate. Grooming and personal hygiene are appropriate.  MEDICAL DECISION MAKING: Patient here with altered mental status. Seems to be mildly improved to where she was at home. She is able to  open her eyes and follows some commands and answer some questions. She denies having any pain. Recently diagnosed with pneumonia and possible urinary tract infection and was admitted 06/09/16 and discharged on 06/11/16. Discharged on antibiotics which family reports she is still on. No recent traumatic events. No recent seizures. Has been altered intermittently for the past 2 days. Patient would not be a code stroke. No obvious new focal neurologic deficit on exam. Will obtain labs, cultures, chest x-ray, head CT. She is mildly hypertensive. We'll give IV fluids cautiously given her history of CHF.  ED PROGRESS: Blood pressure has improved after 1 L of IV fluids. Chest x-ray shows possible worsening pneumonia versus pulmonary edema. We'll add on a BNP. She is on a nonrebreather only because she is breathing through her mouth. Will give vancomycin and cefepime to cover for possible healthcare associated pneumonia. Flu swab is also pending. Lactate is normal. She is mildly leukopenic but not neutropenic. Cultures pending. Urine shows what appears to be a urinary tract infection. Urine culture will be sent. CT of the head shows no acute abnormality. Updated patient's granddaughter at bedside. No significant change in her mentation. Will admit to hospitalist.   EKG shows no new ischemic change. She denies chest pain. Troponin negative. BNP mildly elevated greater than 200. We'll hold further IV hydration.  1:00 AM  Pt's ABG reveals respiratory acidosis. We will place patient on BiPAP.  Family is not sure of code status.   1:20 AM  D/w Dr. Hal Hope with hospitalist service who will see the patient. He thinks she may need ICU but at this time I do not feel she needs critical care.  She still arouses easily to voice, follows commands. I do not feel intubation is needed at this time and we can try BiPAP.  Family is still discussing CODE STATUS. At this time we will keep her a full code.    3:20 AM  Dr.  Hal Hope to admit patient.  He will place admission orders.   I reviewed all nursing notes, vitals, pertinent old records, EKGs, labs, imaging (as available).   EKG Interpretation  Date/Time:  Monday June 15 2016 22:43:46 EST Ventricular Rate:  95 PR Interval:    QRS Duration: 146 QT Interval:  392 QTC Calculation: 493 R Axis:   -26 Text Interpretation:  Atrial fibrillation Left bundle branch block Abnormal ekg Confirmed by Carmin Muskrat  MD [phone]) on 06/15/2016 10:46:54 PM      CRITICAL CARE Performed by: Nyra Jabs   Total critical care time: 45 minutes  Critical care time was exclusive of separately billable procedures and treating other patients.  Critical care was necessary to treat or prevent imminent or life-threatening deterioration.  Critical care was time spent personally by me on the following activities: development of treatment plan with patient and/or surrogate as well as nursing, discussions with consultants, evaluation of patient's response to treatment, examination of patient, obtaining history from patient or surrogate, ordering and performing treatments and interventions, ordering and review of laboratory studies, ordering and review of radiographic studies, pulse oximetry and re-evaluation of patient's condition.    I personally performed the services described in this documentation, which was scribed in my presence. The recorded information has been reviewed and is accurate.     [place], DO 06/16/16 [phone]

## 2016-06-15 NOTE — ED Notes (Signed)
Lab to add on Troponin

## 2016-06-16 ENCOUNTER — Other Ambulatory Visit (HOSPITAL_COMMUNITY): Payer: No Typology Code available for payment source

## 2016-06-16 ENCOUNTER — Encounter (HOSPITAL_COMMUNITY): Payer: Self-pay | Admitting: Internal Medicine

## 2016-06-16 ENCOUNTER — Emergency Department (HOSPITAL_COMMUNITY): Payer: Medicare Other

## 2016-06-16 DIAGNOSIS — Z881 Allergy status to other antibiotic agents status: Secondary | ICD-10-CM | POA: Diagnosis not present

## 2016-06-16 DIAGNOSIS — D61818 Other pancytopenia: Secondary | ICD-10-CM | POA: Diagnosis present

## 2016-06-16 DIAGNOSIS — J9602 Acute respiratory failure with hypercapnia: Secondary | ICD-10-CM

## 2016-06-16 DIAGNOSIS — G9341 Metabolic encephalopathy: Secondary | ICD-10-CM | POA: Diagnosis present

## 2016-06-16 DIAGNOSIS — Z8249 Family history of ischemic heart disease and other diseases of the circulatory system: Secondary | ICD-10-CM | POA: Diagnosis not present

## 2016-06-16 DIAGNOSIS — E118 Type 2 diabetes mellitus with unspecified complications: Secondary | ICD-10-CM | POA: Diagnosis present

## 2016-06-16 DIAGNOSIS — Y95 Nosocomial condition: Secondary | ICD-10-CM | POA: Diagnosis present

## 2016-06-16 DIAGNOSIS — Z6841 Body Mass Index (BMI) 40.0 and over, adult: Secondary | ICD-10-CM | POA: Diagnosis not present

## 2016-06-16 DIAGNOSIS — E875 Hyperkalemia: Secondary | ICD-10-CM | POA: Diagnosis present

## 2016-06-16 DIAGNOSIS — Z825 Family history of asthma and other chronic lower respiratory diseases: Secondary | ICD-10-CM | POA: Diagnosis not present

## 2016-06-16 DIAGNOSIS — E662 Morbid (severe) obesity with alveolar hypoventilation: Secondary | ICD-10-CM | POA: Diagnosis present

## 2016-06-16 DIAGNOSIS — Z880 Allergy status to penicillin: Secondary | ICD-10-CM | POA: Diagnosis not present

## 2016-06-16 DIAGNOSIS — J9601 Acute respiratory failure with hypoxia: Secondary | ICD-10-CM | POA: Diagnosis not present

## 2016-06-16 DIAGNOSIS — I481 Persistent atrial fibrillation: Secondary | ICD-10-CM | POA: Diagnosis present

## 2016-06-16 DIAGNOSIS — E872 Acidosis: Secondary | ICD-10-CM | POA: Diagnosis present

## 2016-06-16 DIAGNOSIS — E1165 Type 2 diabetes mellitus with hyperglycemia: Secondary | ICD-10-CM | POA: Diagnosis present

## 2016-06-16 DIAGNOSIS — N390 Urinary tract infection, site not specified: Secondary | ICD-10-CM | POA: Diagnosis not present

## 2016-06-16 DIAGNOSIS — R569 Unspecified convulsions: Secondary | ICD-10-CM | POA: Diagnosis present

## 2016-06-16 DIAGNOSIS — J189 Pneumonia, unspecified organism: Secondary | ICD-10-CM | POA: Diagnosis present

## 2016-06-16 DIAGNOSIS — I482 Chronic atrial fibrillation: Secondary | ICD-10-CM | POA: Diagnosis present

## 2016-06-16 DIAGNOSIS — K219 Gastro-esophageal reflux disease without esophagitis: Secondary | ICD-10-CM | POA: Diagnosis present

## 2016-06-16 DIAGNOSIS — G9340 Encephalopathy, unspecified: Secondary | ICD-10-CM | POA: Diagnosis not present

## 2016-06-16 DIAGNOSIS — J9600 Acute respiratory failure, unspecified whether with hypoxia or hypercapnia: Secondary | ICD-10-CM | POA: Diagnosis present

## 2016-06-16 DIAGNOSIS — N179 Acute kidney failure, unspecified: Secondary | ICD-10-CM | POA: Diagnosis present

## 2016-06-16 DIAGNOSIS — I69359 Hemiplegia and hemiparesis following cerebral infarction affecting unspecified side: Secondary | ICD-10-CM | POA: Diagnosis not present

## 2016-06-16 DIAGNOSIS — I110 Hypertensive heart disease with heart failure: Secondary | ICD-10-CM | POA: Diagnosis present

## 2016-06-16 DIAGNOSIS — I5043 Acute on chronic combined systolic (congestive) and diastolic (congestive) heart failure: Secondary | ICD-10-CM | POA: Diagnosis present

## 2016-06-16 LAB — INFLUENZA PANEL BY PCR (TYPE A & B)
INFLAPCR: NEGATIVE
Influenza B By PCR: NEGATIVE

## 2016-06-16 LAB — COMPREHENSIVE METABOLIC PANEL
ALBUMIN: 3.1 g/dL — AB (ref 3.5–5.0)
ALK PHOS: 43 U/L (ref 38–126)
ALT: 10 U/L — ABNORMAL LOW (ref 14–54)
ANION GAP: 7 (ref 5–15)
AST: 16 U/L (ref 15–41)
BILIRUBIN TOTAL: 0.9 mg/dL (ref 0.3–1.2)
BUN: 22 mg/dL — ABNORMAL HIGH (ref 6–20)
CALCIUM: 9.3 mg/dL (ref 8.9–10.3)
CO2: 29 mmol/L (ref 22–32)
CREATININE: 1.03 mg/dL — AB (ref 0.44–1.00)
Chloride: 103 mmol/L (ref 101–111)
GFR calc non Af Amer: 47 mL/min — ABNORMAL LOW (ref >60)
GFR, EST AFRICAN AMERICAN: 55 mL/min — AB (ref >60)
GLUCOSE: 185 mg/dL — AB (ref 65–99)
Potassium: 5.7 mmol/L — ABNORMAL HIGH (ref 3.5–5.1)
Sodium: 139 mmol/L (ref 135–145)
TOTAL PROTEIN: 6.1 g/dL — AB (ref 6.5–8.1)

## 2016-06-16 LAB — URINALYSIS, ROUTINE W REFLEX MICROSCOPIC
GLUCOSE, UA: 50 mg/dL — AB
Ketones, ur: 5 mg/dL — AB
NITRITE: NEGATIVE
Protein, ur: 30 mg/dL — AB
SPECIFIC GRAVITY, URINE: 1.021 (ref 1.005–1.030)
pH: 5 (ref 5.0–8.0)

## 2016-06-16 LAB — CBC WITH DIFFERENTIAL/PLATELET
BASOS ABS: 0 10*3/uL (ref 0.0–0.1)
BASOS PCT: 0 %
EOS ABS: 0 10*3/uL (ref 0.0–0.7)
EOS PCT: 1 %
HEMATOCRIT: 37.2 % (ref 36.0–46.0)
Hemoglobin: 10.9 g/dL — ABNORMAL LOW (ref 12.0–15.0)
Lymphocytes Relative: 38 %
Lymphs Abs: 1.2 10*3/uL (ref 0.7–4.0)
MCH: 29.4 pg (ref 26.0–34.0)
MCHC: 29.3 g/dL — ABNORMAL LOW (ref 30.0–36.0)
MCV: 100.3 fL — ABNORMAL HIGH (ref 78.0–100.0)
Monocytes Absolute: 0.3 10*3/uL (ref 0.1–1.0)
Monocytes Relative: 8 %
NEUTROS ABS: 1.6 10*3/uL — AB (ref 1.7–7.7)
Neutrophils Relative %: 53 %
PLATELETS: 153 10*3/uL (ref 150–400)
RBC: 3.71 MIL/uL — ABNORMAL LOW (ref 3.87–5.11)
RDW: 14 % (ref 11.5–15.5)
WBC: 3.1 10*3/uL — ABNORMAL LOW (ref 4.0–10.5)

## 2016-06-16 LAB — I-STAT ARTERIAL BLOOD GAS, ED
ACID-BASE EXCESS: 4 mmol/L — AB (ref 0.0–2.0)
Acid-Base Excess: 6 mmol/L — ABNORMAL HIGH (ref 0.0–2.0)
BICARBONATE: 34.8 mmol/L — AB (ref 20.0–28.0)
Bicarbonate: 34.7 mmol/L — ABNORMAL HIGH (ref 20.0–28.0)
O2 Saturation: 100 %
O2 Saturation: 97 %
PCO2 ART: 88.6 mmHg — AB (ref 32.0–48.0)
PH ART: 7.273 — AB (ref 7.350–7.450)
PO2 ART: 288 mmHg — AB (ref 83.0–108.0)
Patient temperature: 98
Patient temperature: 98
TCO2: 37 mmol/L (ref 0–100)
TCO2: 37 mmol/L (ref 0–100)
pCO2 arterial: 74.7 mmHg (ref 32.0–48.0)
pH, Arterial: 7.2 — ABNORMAL LOW (ref 7.350–7.450)
pO2, Arterial: 106 mmHg (ref 83.0–108.0)

## 2016-06-16 LAB — APTT
APTT: 36 s (ref 24–36)
aPTT: 155 seconds — ABNORMAL HIGH (ref 24–36)
aPTT: 173 seconds (ref 24–36)

## 2016-06-16 LAB — LACTIC ACID, PLASMA: LACTIC ACID, VENOUS: 1.1 mmol/L (ref 0.5–1.9)

## 2016-06-16 LAB — HEPARIN LEVEL (UNFRACTIONATED)

## 2016-06-16 LAB — MRSA PCR SCREENING: MRSA by PCR: NEGATIVE

## 2016-06-16 LAB — BRAIN NATRIURETIC PEPTIDE: B NATRIURETIC PEPTIDE 5: 233.2 pg/mL — AB (ref 0.0–100.0)

## 2016-06-16 LAB — STREP PNEUMONIAE URINARY ANTIGEN: STREP PNEUMO URINARY ANTIGEN: NEGATIVE

## 2016-06-16 MED ORDER — FUROSEMIDE 10 MG/ML IJ SOLN
40.0000 mg | Freq: Once | INTRAMUSCULAR | Status: AC
  Administered 2016-06-16: 40 mg via INTRAVENOUS
  Filled 2016-06-16: qty 4

## 2016-06-16 MED ORDER — DEXTROSE 5 % IV SOLN
1.0000 g | Freq: Three times a day (TID) | INTRAVENOUS | Status: DC
  Administered 2016-06-16 – 2016-06-17 (×4): 1 g via INTRAVENOUS
  Filled 2016-06-16 (×5): qty 1

## 2016-06-16 MED ORDER — VANCOMYCIN HCL 10 G IV SOLR
1750.0000 mg | INTRAVENOUS | Status: DC
  Administered 2016-06-16: 1750 mg via INTRAVENOUS
  Filled 2016-06-16: qty 1750

## 2016-06-16 MED ORDER — FUROSEMIDE 10 MG/ML IJ SOLN: 40.0000 mg | Freq: Two times a day (BID) | INTRAMUSCULAR | Status: DC

## 2016-06-16 MED ORDER — METOPROLOL TARTRATE 5 MG/5ML IV SOLN
2.5000 mg | Freq: Four times a day (QID) | INTRAVENOUS | Status: DC
  Administered 2016-06-16 – 2016-06-17 (×5): 2.5 mg via INTRAVENOUS
  Filled 2016-06-16 (×5): qty 5

## 2016-06-16 MED ORDER — ONDANSETRON HCL 4 MG PO TABS
4.0000 mg | ORAL_TABLET | Freq: Four times a day (QID) | ORAL | Status: DC | PRN
Start: 2016-06-16 — End: 2016-06-23

## 2016-06-16 MED ORDER — ACETAMINOPHEN 325 MG PO TABS
650.0000 mg | ORAL_TABLET | Freq: Four times a day (QID) | ORAL | Status: DC | PRN
  Administered 2016-06-17: 650 mg via ORAL
  Filled 2016-06-16: qty 2

## 2016-06-16 MED ORDER — DEXTROSE 5 % IV SOLN
2.0000 g | Freq: Once | INTRAVENOUS | Status: AC
  Administered 2016-06-16: 2 g via INTRAVENOUS
  Filled 2016-06-16: qty 2

## 2016-06-16 MED ORDER — ACETAMINOPHEN 650 MG RE SUPP
650.0000 mg | Freq: Four times a day (QID) | RECTAL | Status: DC | PRN
Start: 2016-06-16 — End: 2016-06-23

## 2016-06-16 MED ORDER — ONDANSETRON HCL 4 MG/2ML IJ SOLN: 4.0000 mg | Freq: Four times a day (QID) | INTRAMUSCULAR | Status: DC | PRN

## 2016-06-16 MED ORDER — SODIUM POLYSTYRENE SULFONATE 15 GM/60ML PO SUSP
30.0000 g | Freq: Once | ORAL | Status: AC
  Administered 2016-06-16: 30 g via ORAL
  Filled 2016-06-16: qty 120

## 2016-06-16 MED ORDER — SODIUM CHLORIDE 0.9 % IV BOLUS (SEPSIS)
500.0000 mL | Freq: Once | INTRAVENOUS | Status: AC
  Administered 2016-06-16: 500 mL via INTRAVENOUS

## 2016-06-16 MED ORDER — CHLORHEXIDINE GLUCONATE 0.12 % MT SOLN
15.0000 mL | Freq: Two times a day (BID) | OROMUCOSAL | Status: DC
  Administered 2016-06-16 – 2016-06-23 (×14): 15 mL via OROMUCOSAL
  Filled 2016-06-16 (×14): qty 15

## 2016-06-16 MED ORDER — LEVOFLOXACIN 500 MG PO TABS
500.0000 mg | ORAL_TABLET | Freq: Every day | ORAL | Status: AC
Start: 2016-06-16 — End: 2016-06-19
  Administered 2016-06-16 – 2016-06-19 (×4): 500 mg via ORAL
  Filled 2016-06-16 (×4): qty 1

## 2016-06-16 MED ORDER — SODIUM CHLORIDE 0.9 % IV SOLN
500.0000 mg | Freq: Two times a day (BID) | INTRAVENOUS | Status: DC
  Administered 2016-06-16 – 2016-06-17 (×3): 500 mg via INTRAVENOUS
  Filled 2016-06-16 (×4): qty 5

## 2016-06-16 MED ORDER — IPRATROPIUM-ALBUTEROL 0.5-2.5 (3) MG/3ML IN SOLN: 3.0000 mL | Freq: Four times a day (QID) | RESPIRATORY_TRACT | Status: DC | PRN

## 2016-06-16 MED ORDER — HEPARIN (PORCINE) IN NACL 100-0.45 UNIT/ML-% IJ SOLN
1600.0000 [IU]/h | INTRAMUSCULAR | Status: DC
  Administered 2016-06-16: 1600 [IU]/h via INTRAVENOUS
  Filled 2016-06-16: qty 250

## 2016-06-16 MED ORDER — ORAL CARE MOUTH RINSE
15.0000 mL | Freq: Two times a day (BID) | OROMUCOSAL | Status: DC
  Administered 2016-06-16 – 2016-06-23 (×12): 15 mL via OROMUCOSAL

## 2016-06-16 MED ORDER — FUROSEMIDE 10 MG/ML IJ SOLN
40.0000 mg | Freq: Two times a day (BID) | INTRAMUSCULAR | Status: DC
  Administered 2016-06-16 – 2016-06-22 (×12): 40 mg via INTRAVENOUS
  Filled 2016-06-16 (×12): qty 4

## 2016-06-16 MED ORDER — HEPARIN (PORCINE) IN NACL 100-0.45 UNIT/ML-% IJ SOLN
1200.0000 [IU]/h | INTRAMUSCULAR | Status: DC
  Administered 2016-06-16 – 2016-06-17 (×2): 1200 [IU]/h via INTRAVENOUS
  Filled 2016-06-16: qty 250

## 2016-06-16 MED ORDER — VANCOMYCIN HCL IN DEXTROSE 1-5 GM/200ML-% IV SOLN
1000.0000 mg | Freq: Once | INTRAVENOUS | Status: DC
Start: 2016-06-16 — End: 2016-06-16
  Filled 2016-06-16: qty 200

## 2016-06-16 MED ORDER — VANCOMYCIN HCL IN DEXTROSE 1-5 GM/200ML-% IV SOLN
1000.0000 mg | Freq: Once | INTRAVENOUS | Status: AC
  Administered 2016-06-16: 1000 mg via INTRAVENOUS
  Filled 2016-06-16: qty 200

## 2016-06-16 NOTE — Progress Notes (Signed)
ANTICOAGULATION CONSULT NOTE - Follow Up Consult  Pharmacy Consult for Heparin Indication: atrial fibrillation  Patient Measurements: Height: 5\' 6"  (167.6 cm) Weight: 298 lb 4.5 oz (135.3 kg) IBW/kg (Calculated) : 59.3 Heparin Dosing Weight: 92.4 kg  Vital Signs: Temp: 97.4 F (36.3 C) (02/13 1500) Temp Source: Oral (02/13 1500) BP: 127/83 (02/13 1500) Pulse Rate: 96 (02/13 1500)  Labs:  Recent Labs  06/15/16 2250 06/16/16 0418 06/16/16 1659  HGB 10.7* 10.9*  --   HCT 35.1* 37.2  --   PLT 156 153  --   APTT  --  36 155*  HEPARINUNFRC  --  >2.20*  --   CREATININE 1.15* 1.03*  --   TROPONINI <0.03  --   --     Estimated Creatinine Clearance: 54.5 mL/min (by C-G formula based on SCr of 1.03 mg/dL (H)).   Assessment: 89 YOF on apixaban PTA for hx Afib transitioned to heparin this admit in the setting of AMS/somonlence with unreliable po intake. The patient's initial aPTT this evening was elevated at 155 however was noted to be drawn from the same arm that heparin was infusing in. A repeat aPTT from the opposite arm remained SUPRAtherapeutic at 173. Will hold the heparin drip for 1 hour and resume at a lower rate. No bleeding noted at this time per RN report.   Goal of Therapy:  aPTT 66-102 seconds Monitor platelets by anticoagulation protocol: Yes   Plan:  1. Hold heparin for 1 hour 2. Restart heparin at a rate of 1200 units/hr (12 ml/hr) starting at 2100 3. Will continue to monitor for any signs/symptoms of bleeding and will follow up with heparin level in 8 hours after heparin restarted  Thank you for allowing pharmacy to be a part of this patient's care.  Alycia Rossetti, PharmD, BCPS Clinical Pharmacist Pager: [phone] 06/16/2016 5:43 PM

## 2016-06-16 NOTE — Progress Notes (Addendum)
Inpatient Diabetes Program Recommendations  AACE/ADA: New Consensus Statement on Inpatient Glycemic Control (2015)  Target Ranges:  Prepandial:   less than 140 mg/dL      Peak postprandial:   less than 180 mg/dL (1-2 hours)      Critically ill patients:  140 - 180 mg/dL   Lab Results  Component Value Date   GLUCAP 209 (H) 06/15/2016   HGBA1C 9.4 (H) 06/09/2016    Review of Glycemic Control Results for Elizabeth Pugh, Elizabeth Pugh (MRN [id number]) as of 06/16/2016 11:19  Ref. Range 06/15/2016 22:33  Glucose-Capillary Latest Ref Range: 65 - 99 mg/dL 209 (H)   Diabetes history: DM2 Outpatient Diabetes medications: Lantus 30 units hs + Novolog 0-12 units for CBG > 150 Current orders for Inpatient glycemic control: No orders  Inpatient Diabetes Program Recommendations:  Noted patient currently on Bipap mask. Text page sent to Dr. Candiss Norse. Please consider: -Novolog sensitive correction 0-9 units q 4 hrs. While NPO -Lantus 15 units daily (50% of home dose)  Thank you, [name] E. Taiquan Campanaro, RN, MSN, CDE Inpatient Glycemic Control Team Team Pager [phone] (8am-5pm) 06/16/2016 11:21 AM

## 2016-06-16 NOTE — Care Management Note (Signed)
Case Management Note  Patient Details  Name: Elizabeth Pugh MRN: [id number] Date of Birth: Jul 20, 1926  Subjective/Objective:    Adm w resp failure                Action/Plan:cared for at home by family.   Expected Discharge Date:                  Expected Discharge Plan:  [place]  In-House Referral:     Discharge planning Services     Post Acute Care Choice:    Choice offered to:     DME Arranged:    DME Agency:     HH Arranged:    [place] Agency:     Status of Service:  In process, will continue to follow  If discussed at Long Length of Stay Meetings, dates discussed:    Additional Comments:cm consult for bariatric air bed. Spoke w [facility] home[facility] and pt will not qualify for bariatric bed. Reg semi electric beds go up to 350 lbs. Could get air overlay or gel matress possibly for semi electric bed. Await md orders.  Lacretia Leigh, RN 06/16/2016, 3:39 PM

## 2016-06-16 NOTE — H&P (Addendum)
History and Physical    Elizabeth Pugh [id number] DOB: 08/14/1926 DOA: 06/15/2016  PCP: Philis Fendt, MD  Patient coming from: Home.  Chief Complaint: Altered mental status.  History obtained from patient's granddaughter in [place]. Patient is confused.  HPI: Elizabeth Pugh is a 83 y.o. female with recently admitted for pneumonia discharged 5 days ago was brought to the ER after patient's family found that patient has become very lethargic and confused last evening. Over the last 2 days patient has being getting confused off and on but became more persistent last evening. Patient also has been developing more cough. In the ER patient was found to be febrile with chest x-ray showing infiltrate concerning for pneumonia. CT head was unremarkable. ABG shows hypercarbia with respiratory acidosis. Patient was placed on BiPAP and on antibiotics for pneumonia. Patient's family has requested that patient's wishes are not to be intubated. So patient was continued on BiPAP. On exam patient is only responding to her name. Otherwise does not follow commands.   ED Course: Chest x-ray shows infiltrates. UA shows features consistent with UTI. Blood cultures urine cultures were sent. ABG shows hypercarbia. Patient is placed on BiPAP. As per patient's granddaughter patient's wishes is  - DO NOT INTUBATE.  Review of Systems: As per HPI, rest all negative.   Past Medical History:  Diagnosis Date  . Acute delirium 04/12/2011  . Acute kidney injury ([place]) 09/02/2015  . Anemia   . Anxiety   . Arthritis   . Atrial fibrillation ([place])   . Blood transfusion   . Bronchitis   . CHF (congestive heart failure) ([place])   . Diabetes mellitus   . GERD (gastroesophageal reflux disease)   . GI (gastrointestinal bleed)    Due to benign mass  . Hypertension   . Lobar pneumonia due to unspecified organism 09/02/2015  . On home oxygen therapy    "4L; 24/7" (12/11/2015)  . Restless leg syndrome   . Seizures  ([place])   . Stroke [facility])     Past Surgical History:  Procedure Laterality Date  . CATARACT EXTRACTION W/ INTRAOCULAR LENS IMPLANT     "Dr. Gershon Crane; think they did the right one"  . EUS  09/24/2011   Procedure: UPPER ENDOSCOPIC ULTRASOUND (EUS) LINEAR;  Surgeon: Beryle Beams, MD;  Location: [facility] ENDOSCOPY;  Service: Endoscopy;  Laterality: N/A;  . TONSILLECTOMY  1935  . TUBAL LIGATION       reports that she quit smoking about 30 years ago. Her smoking use included Cigarettes. She has a 40.00 pack-year smoking history. She has never used smokeless tobacco. She reports that she does not drink alcohol or use drugs.  Allergies  Allergen Reactions  . Erythromycin Other (See Comments)    Vaginal itching  . Penicillins Rash and Other (See Comments)    Has patient had a PCN reaction causing immediate rash, facial/tongue/throat swelling, SOB or lightheadedness with hypotension: No Has patient had a PCN reaction causing severe rash involving mucus membranes or skin necrosis: No Has patient had a PCN reaction that required hospitalization: No Has patient had a PCN reaction occurring within the last 10 years: No If all of the above answers are "NO", then may proceed with Cephalosporin use.  Marland Kitchen Zithromax [Azithromycin] Other (See Comments)    gave her a yeast infection.    Family History  Problem Relation Age of Onset  . Aneurysm Mother   . Heart attack Father   . Heart failure Sister   .  Asthma Sister   . Kidney failure Sister   . Stroke Sister     Prior to Admission medications   Medication Sig Start Date End Date Taking? Authorizing Provider  albuterol (PROVENTIL HFA;VENTOLIN HFA) 108 (90 BASE) MCG/ACT inhaler Inhale 1 puff into the lungs every 6 (six) hours as needed for wheezing or shortness of breath.   Yes Historical Provider, MD  ALPRAZolam (XANAX) 0.25 MG tablet Take 1 tablet (0.25 mg total) by mouth 2 (two) times daily as needed for anxiety. 01/28/15  Yes Geradine Girt, DO    apixaban (ELIQUIS) 5 MG TABS tablet Take 1 tablet (5 mg total) by mouth 2 (two) times daily. 09/27/15  Yes Lavina Hamman, MD  calcium-vitamin D (CALCIUM 500+D) 500-400 MG-UNIT per tablet Take 1 tablet by mouth 2 (two) times daily.   Yes Historical Provider, MD  carvedilol (COREG) 3.125 MG tablet Take 1 tablet (3.125 mg total) by mouth 2 (two) times daily with a meal. 10/01/15  Yes Lavina Hamman, MD  cefdinir (OMNICEF) 300 MG capsule Take 1 capsule (300 mg total) by mouth 2 (two) times daily. 06/12/16 06/19/16 Yes Mariel Aloe, MD  Docusate Calcium (STOOL SOFTENER PO) Take 1 capsule by mouth daily as needed (For constipation.).    Yes Historical Provider, MD  ferrous sulfate 325 (65 FE) MG tablet Take 325 mg by mouth daily with supper.    Yes Historical Provider, MD  furosemide (LASIX) 40 MG tablet Take 40 mg by mouth 2 (two) times daily.   Yes Historical Provider, MD  guaiFENesin (MUCINEX) 600 MG 12 hr tablet Take 2 tablets (1,200 mg total) by mouth 2 (two) times daily. 06/11/16  Yes Mariel Aloe, MD  hydrOXYzine (ATARAX/VISTARIL) 25 MG tablet Take 25 mg by mouth every 8 (eight) hours as needed for itching.  05/10/16  Yes Historical Provider, MD  insulin aspart (NOVOLOG) 100 UNIT/ML injection Inject 0-12 Units into the skin as needed for high blood sugar. Give if blood sugar is greater than 150.   Yes Historical Provider, MD  insulin glargine (LANTUS) 100 UNIT/ML injection Inject 0.15 mLs (15 Units total) into the skin at bedtime. Patient taking differently: Inject 30 Units into the skin at bedtime.  09/27/15  Yes Lavina Hamman, MD  ipratropium-albuterol (DUONEB) 0.5-2.5 (3) MG/3ML SOLN Take 3 mLs by nebulization every 6 (six) hours as needed. Patient taking differently: Take 3 mLs by nebulization every 6 (six) hours as needed (For shortness of breath.).  01/25/15  Yes [name], MD  levETIRAcetam (KEPPRA) 500 MG tablet Take 1 tablet (500 mg total) by mouth 2 (two) times daily. 02/27/12  Yes Monika Salk, MD  Naphazoline-Pheniramine (ALLERGY EYE OP) Place 1 drop into both eyes as needed (For eye irritation.).   Yes Historical Provider, MD  polyethylene glycol (MIRALAX / GLYCOLAX) packet Take 17 g by mouth daily as needed for mild constipation. 06/11/16  Yes Mariel Aloe, MD  pregabalin (LYRICA) 75 MG capsule Take 75 mg by mouth at bedtime.    Yes Historical Provider, MD  rOPINIRole (REQUIP) 2 MG tablet Take 1 tablet (2 mg total) by mouth 3 (three) times daily. 09/27/15  Yes Lavina Hamman, MD  Vitamin D, Ergocalciferol, (DRISDOL) 50000 units CAPS capsule Take 50,000 Units by mouth every Saturday. No specific day    Yes Historical Provider, MD  VOLTAREN 1 % GEL Apply 1 application topically 4 (four) times daily as needed (For knee pain.).  10/26/11  Yes  Historical Provider, MD  pantoprazole (PROTONIX) 40 MG tablet Take 1 tablet (40 mg total) by mouth 2 (two) times daily before a meal. 10/01/15   Lavina Hamman, MD    Physical Exam: Vitals:   06/16/16 0218 06/16/16 0231 06/16/16 0245 06/16/16 0300  BP: 123/70 118/66 119/65 125/82  Pulse: 91 85 77 77  Resp: 17 19 16  (!) 27  Temp:      TempSrc:      SpO2: 99% 100% 99% 100%      Constitutional: Obese not in distress. Vitals:   06/16/16 0218 06/16/16 0231 06/16/16 0245 06/16/16 0300  BP: 123/70 118/66 119/65 125/82  Pulse: 91 85 77 77  Resp: 17 19 16  (!) 27  Temp:      TempSrc:      SpO2: 99% 100% 99% 100%   Eyes: Anicteric. no pallor. ENMT: No discharge from the ears eyes nose and mouth. Neck: No mass felt. No neck rigidity. Respiratory: No rhonchi or crepitations. Cardiovascular: S1-S2 heard no murmurs appreciated. Abdomen: Soft nontender bowel sounds present. Musculoskeletal: No edema. No joint effusion. Skin: Chronic skin changes in the lower extremity. Neurologic: Lethargic does not follow commands. Pupils reacting. Psychiatric: Patient is lethargic.   Labs on Admission: I have personally reviewed following labs and  imaging studies  CBC:  Recent Labs Lab 06/09/16 1421 06/10/16 0411 06/11/16 0449 06/15/16 2250  WBC 5.0 6.4 4.6 3.8*  NEUTROABS 3.5  --   --  2.4  HGB 13.1 10.2* 10.8* 10.7*  HCT 42.6 33.3* 35.3* 35.1*  MCV 98.6 95.4 97.8 99.4  PLT 133* 115* 104* [id number]   Basic Metabolic Panel:  Recent Labs Lab 06/09/16 1421 06/09/16 1802 06/10/16 0411 06/11/16 0422 06/15/16 2250  NA 139  --  139 137 140  K 5.5*  --  4.7 4.9 5.3*  CL 98*  --  101 100* 102  CO2 33*  --  34* 32 33*  GLUCOSE 229*  --  261* 230* 226*  BUN 26*  --  28* 24* 22*  CREATININE 1.16*  --  1.11* 1.08* 1.15*  CALCIUM 9.3  --  8.5* 8.2* 9.6  MG  --  1.7  --   --   --    GFR: CrCl cannot be calculated (Unknown ideal weight.). Liver Function Tests:  Recent Labs Lab 06/09/16 1421 06/10/16 0411 06/15/16 2250  AST 15 14* 11*  ALT 10* 11* 10*  ALKPHOS 58 40 45  BILITOT 0.9 1.2 0.9  PROT 6.8 5.9* 6.1*  ALBUMIN 3.7 2.9* 3.1*   No results for input(s): LIPASE, AMYLASE in the last 168 hours. No results for input(s): AMMONIA in the last 168 hours. Coagulation Profile: No results for input(s): INR, PROTIME in the last 168 hours. Cardiac Enzymes:  Recent Labs Lab 06/09/16 1802 06/09/16 2232 06/15/16 2250  TROPONINI 0.03* <0.03 <0.03   BNP (last 3 results) No results for input(s): PROBNP in the last 8760 hours. HbA1C: No results for input(s): HGBA1C in the last 72 hours. CBG:  Recent Labs Lab 06/10/16 2108 06/11/16 0717 06/11/16 1209 06/11/16 1801 06/15/16 2233  GLUCAP 229* 187* 246* 253* 209*   Lipid Profile: No results for input(s): CHOL, HDL, LDLCALC, TRIG, CHOLHDL, LDLDIRECT in the last 72 hours. Thyroid Function Tests: No results for input(s): TSH, T4TOTAL, FREET4, T3FREE, THYROIDAB in the last 72 hours. Anemia Panel: No results for input(s): VITAMINB12, FOLATE, FERRITIN, TIBC, IRON, RETICCTPCT in the last 72 hours. Urine analysis:    Component Value Date/Time  COLORURINE YELLOW  06/15/2016 2356   APPEARANCEUR CLOUDY (A) 06/15/2016 2356   LABSPEC 1.021 06/15/2016 2356   PHURINE 5.0 06/15/2016 2356   GLUCOSEU 50 (A) 06/15/2016 2356   HGBUR MODERATE (A) 06/15/2016 2356   BILIRUBINUR SMALL (A) 06/15/2016 2356   KETONESUR 5 (A) 06/15/2016 2356   PROTEINUR 30 (A) 06/15/2016 2356   UROBILINOGEN 1.0 01/19/2015 2241   NITRITE NEGATIVE 06/15/2016 2356   LEUKOCYTESUR LARGE (A) 06/15/2016 2356   Sepsis Labs: @LABRCNTIP (procalcitonin:4,lacticidven:4) ) Recent Results (from the past 240 hour(s))  Blood culture (routine x 2)     Status: None   Collection Time: 06/09/16  2:22 PM  Result Value Ref Range Status   Specimen Description BLOOD LEFT ARM  Final   Special Requests BOTTLES DRAWN AEROBIC AND ANAEROBIC 5 CC EA  Final   Culture   Final    NO GROWTH 5 DAYS Performed at [facility], [place] [address] [place], [place] [address]    Report Status 06/14/2016 FINAL  Final  Blood culture (routine x 2)     Status: None   Collection Time: 06/09/16  2:22 PM  Result Value Ref Range Status   Specimen Description BLOOD RIGHT ARM  Final   Special Requests IN PEDIATRIC BOTTLE  Final   Culture   Final    NO GROWTH 5 DAYS Performed at [facility], [place] [address], [place], [place] [address]    Report Status 06/14/2016 FINAL  Final  Culture, blood (routine x 2) Call MD if unable to obtain prior to antibiotics being given     Status: None   Collection Time: 06/09/16  8:13 PM  Result Value Ref Range Status   Specimen Description BLOOD LEFT HAND  Final   Special Requests BOTTLES DRAWN AEROBIC ONLY 5CC  Final   Culture   Final    NO GROWTH 5 DAYS Performed at [facility], [place] [address], [place], [place] [address]    Report Status 06/14/2016 FINAL  Final  Respiratory Panel by PCR     Status: None   Collection Time: 06/09/16  9:54 PM  Result Value Ref Range Status   Adenovirus NOT DETECTED NOT DETECTED Final   Coronavirus 229E NOT DETECTED NOT DETECTED Final    Coronavirus HKU1 NOT DETECTED NOT DETECTED Final   Coronavirus NL63 NOT DETECTED NOT DETECTED Final   Coronavirus OC43 NOT DETECTED NOT DETECTED Final   Metapneumovirus NOT DETECTED NOT DETECTED Final   Rhinovirus / Enterovirus NOT DETECTED NOT DETECTED Final   Influenza A NOT DETECTED NOT DETECTED Final   Influenza B NOT DETECTED NOT DETECTED Final   Parainfluenza Virus 1 NOT DETECTED NOT DETECTED Final   Parainfluenza Virus 2 NOT DETECTED NOT DETECTED Final   Parainfluenza Virus 3 NOT DETECTED NOT DETECTED Final   Parainfluenza Virus 4 NOT DETECTED NOT DETECTED Final   Respiratory Syncytial Virus NOT DETECTED NOT DETECTED Final   Bordetella pertussis NOT DETECTED NOT DETECTED Final   Chlamydophila pneumoniae NOT DETECTED NOT DETECTED Final   Mycoplasma pneumoniae NOT DETECTED NOT DETECTED Final    Comment: Performed at [facility], [place] [address], [place], [place] [address]  Culture, sputum-assessment     Status: None   Collection Time: 06/10/16  8:59 AM  Result Value Ref Range Status   Specimen Description SPUTUM  Final   Special Requests NONE  Final   Sputum evaluation THIS SPECIMEN IS ACCEPTABLE FOR SPUTUM CULTURE  Final   Report Status  06/10/2016 FINAL  Final  Culture, respiratory (NON-Expectorated)     Status: None   Collection Time: 06/10/16  8:59 AM  Result Value Ref Range Status   Specimen Description SPUTUM  Final   Special Requests NONE Reflexed from [id number]  Final   Gram Stain   Final    FEW WBC PRESENT,BOTH PMN AND MONONUCLEAR RARE GRAM POSITIVE COCCI RARE GRAM NEGATIVE RODS    Culture   Final    Consistent with normal respiratory flora. Performed at [facility], [place] [address] [place], [place] [address]    Report Status 06/12/2016 FINAL  Final     Radiological Exams on Admission: Ct Head Wo Contrast  Result Date: 06/16/2016 CLINICAL DATA:  Altered mental status EXAM: CT HEAD WITHOUT CONTRAST TECHNIQUE: Contiguous axial images were obtained  from the base of the skull through the vertex without intravenous contrast. COMPARISON:  12/11/2015 CT FINDINGS: Brain: Mild sulcal prominence bilaterally consistent superficial atrophy. Minimal small vessel ischemic disease of periventricular white matter. No large vascular territory infarction, hemorrhage or midline shift. Left basal ganglial lacunar infarcts appear chronic. Fourth ventricle is midline. No extra-axial fluid collections. Vascular: Moderate atherosclerosis of the carotid siphons. No hyperdense vessels. Skull: Normal. Negative for fracture or focal lesion. Osteopenic appearance of the bony calvarium. Sinuses/Orbits: Inferior mastoid air cells are partially opacified consistent with a mastoid effusion. Minimal ethmoid sinus mucosal thickening. The visualized sphenoid and maxillary sinuses as well as the frontal sinus appear clear. Other: None IMPRESSION: No acute intracranial abnormality. Chronic small vessel ischemic disease and left-sided basal ganglial lacunar infarcts. Chronic small mastoid effusion on the right. Moderate atherosclerosis of carotid siphons Electronically Signed   By: Ashley Royalty M.D.   On: 06/16/2016 00:25   Dg Chest Port 1 View  Result Date: 06/15/2016 CLINICAL DATA:  Lethargic tonight. Discharge from hospital for days ago with urinary tract infection and pneumonia. EXAM: PORTABLE CHEST 1 VIEW COMPARISON:  06/09/2016 FINDINGS: Shallow inspiration. Cardiac enlargement with mild pulmonary vascular congestion. Bilateral perihilar infiltrates are progressing since previous study and may indicate edema or progressing pneumonia. Small bilateral pleural effusions. Calcified and tortuous aorta. No pneumothorax. IMPRESSION: Progression of perihilar infiltrates may represent developing edema or progressing pneumonia. Small pleural effusions. Electronically Signed   By: Lucienne Capers M.D.   On: 06/15/2016 23:19    EKG: Independently reviewed. A. fib rate  controlled.  Assessment/Plan Active Problems:   DM (diabetes mellitus), type 2, uncontrolled with complications (HCC)   CVA, old, hemiparesis (HCC)   Chronic atrial fibrillation (HCC)   Chronic diastolic heart failure (HCC)   Acute encephalopathy   Acute respiratory failure with hypoxia and hypercapnia (HCC)   Urinary tract infection without hematuria   HCAP (healthcare-associated pneumonia)   Acute respiratory failure ([place])    1. Acute respiratory failure with hypercarbia - patient probably has obesity hypoventilation or sleep apnea. Probably exacerbated by patient's pneumonia and fever. Patient's granddaughter has conveyed that patient's wishes are not to be intubated. We will continue on BiPAP. Patient's family notified that patient is critically ill. Will get palliative care consult. 2. Pneumonia - since patient was recently in hospital will treat this as healthcare associated pneumonia. Once patient is more alert, need swallow evaluation. Influenza PCR negative. 3. Persistent atrial fibrillation - chads 2 vasc score of 8. Since patient cannot take oral anticoagulants had to place patient on IV heparin infusion. IV metoprolol scheduled dose until patient can take oral Coreg. 4. Diabetes mellitus type 2 - will place  patient on sliding scale coverage. 5. Chronic diastolic CHF - if patient's blood pressure continues to remain stable may restart Lasix.   DVT prophylaxis: Heparin. Code Status: DO NOT INTUBATE.  Family Communication: Patient's granddaughter.  Disposition Plan: Home.  Consults called: None.  Admission status: Inpatient.    Rise Patience MD [facility] Pager [phone].  If 7PM-7AM, please contact night-coverage [email or web address] Password [email or web address]  06/16/2016, 3:23 AM

## 2016-06-16 NOTE — ED Notes (Signed)
Patient transported to CT 

## 2016-06-16 NOTE — ED Notes (Addendum)
Patient transported to CT 

## 2016-06-16 NOTE — Progress Notes (Signed)
PROGRESS NOTE                                                                                                                                                                                                             Patient Demographics:    Elizabeth Pugh, is a 83 y.o. female, DOB - 1926/09/09, CNO:[id number]  Admit date - 06/15/2016   Admitting Physician Rise Patience, MD  Outpatient Primary MD for the patient is Philis Fendt, MD  LOS - 0  Chief Complaint  Patient presents with  . Altered Mental Status       Brief Narrative  Elizabeth Pugh is a 83 y.o. female with recently admitted for pneumonia discharged 5 days ago was brought to the ER after patient's family found that patient has become very lethargic and confused last evening. He was found to have PNA and hypercarbic respiratory failure with encephalopathy. She was placed on BiPAP and antibiotics with improvement.   Subjective:    [name] today has, Is somnolent but able to answer basic questions, No headache, No chest pain, No abdominal pain - No Nausea, No new weakness tingling or numbness, No Cough - SOB.    Assessment  & Plan :     1.Acute hypercapnic respiratory failure due to UTI and CHF combination with Met Encephalopathy .  Think she has underlying sleep apnea due to morbid obesity along with OHS, due to UTI she became lethargic and developed encephalopathy and CO2 retention.  She is clinically improving on BiPAP which will be continued till she is more awake alert, nothing by mouth except medications, treat UTI, informed family clearly that she needs outpatient sleep study. Apparently she was ordered one but she never got it done. For now nighttime CPAP/BiPAP while she is in the hospital. Head CT is nonacute and she has no focal deficits or headache.  2. UTI. Do not think she has pneumonia. Infiltrates are likely edema. No cough or  leukocytosis, continue cefepime, discontinue vancomycin and place her on 4 days of Levaquin. Low cultures.  3. Acute on chronic systolic CHF. EF which was checked recently last admission was 40%. On beta blocker, place on low-dose Lasix, one at her intake output, place Foley catheter as she has poor control over her bladder and already has skin breakdown. Blood pressure borderline and  renal function borderline hence will refrain from ACE/ARB for now.  4. Moderate obesity. Follow with PCP for weight loss and outpatient sleep study.  5. Chr.A.fibrillation. [place] vasc 2 score of 8. Continue beta blocker, since on BiPAP and oral intake is questionable for now on heparin drip continue, once mentation has improved and she is taking oral diet switch back to Eliquis.  6. DM type II. Currently on sliding scale monitor.  Lab Results  Component Value Date   HGBA1C 9.4 (H) 06/09/2016   CBG (last 3)   Recent Labs  06/15/16 2233  GLUCAP 209*     Diet : Diet NPO time specified Except for: Sips with Meds    Family Communication  :  sisters  Code Status :  No Intubation  Disposition Plan  :  SNF  Consults  :  None  Procedures  :    DVT Prophylaxis  :  Heparin gtt / Eliquis  Lab Results  Component Value Date   PLT 153 06/16/2016    Inpatient Medications  Scheduled Meds: . ceFEPime (MAXIPIME) IV  1 g Intravenous Q8H  . chlorhexidine  15 mL Mouth Rinse BID  . levETIRAcetam  500 mg Intravenous Q12H  . mouth rinse  15 mL Mouth Rinse q12n4p  . metoprolol  2.5 mg Intravenous Q6H  . sodium chloride  500 mL Intravenous Once  . sodium polystyrene  30 g Oral Once  . vancomycin  1,750 mg Intravenous Q24H   Continuous Infusions: . heparin 1,600 Units/hr (06/16/16 0913)   PRN Meds:.acetaminophen **OR** acetaminophen, ipratropium-albuterol, ondansetron **OR** ondansetron (ZOFRAN) IV  Antibiotics  :    Anti-infectives    Start     Dose/Rate Route Frequency Ordered Stop   06/16/16 0800   ceFEPIme (MAXIPIME) 1 g in dextrose 5 % 50 mL IVPB     1 g 100 mL/hr over 30 Minutes Intravenous Every 8 hours 06/16/16 0322 06/24/16 0559   06/16/16 0500  vancomycin (VANCOCIN) 1,750 mg in sodium chloride 0.9 % 500 mL IVPB     1,750 mg 250 mL/hr over 120 Minutes Intravenous Every 24 hours 06/16/16 0448 06/24/16 0459   06/16/16 0345  vancomycin (VANCOCIN) IVPB 1000 mg/200 mL premix  Status:  Discontinued     1,000 mg 200 mL/hr over 60 Minutes Intravenous  Once 06/16/16 0342 06/16/16 0447   06/16/16 0015  vancomycin (VANCOCIN) IVPB 1000 mg/200 mL premix     1,000 mg 200 mL/hr over 60 Minutes Intravenous  Once 06/16/16 0005 06/16/16 0257   06/16/16 0015  ceFEPIme (MAXIPIME) 2 g in dextrose 5 % 50 mL IVPB     2 g 100 mL/hr over 30 Minutes Intravenous  Once 06/16/16 0005 06/16/16 0118         Objective:   Vitals:   06/16/16 0407 06/16/16 0630 06/16/16 0721 06/16/16 0823  BP: 137/82  124/77 124/77  Pulse: 77  92 (!) 102  Resp: _0 Temp: 97.6 F (36.4 C)  97.4 F (36.3 C)   TempSrc: Oral  Oral   SpO2: 98% 100% 100% 100%  Weight: 135.3 kg (298 lb 4.5 oz)     Height: _1  (1.676 m)       Wt Readings from Last 3 Encounters:  06/16/16 135.3 kg (298 lb 4.5 oz)  12/17/15 (!) 143.3 kg (316 lb)  10/14/15 127.5 kg (281 lb)     Intake/Output Summary (Last 24 hours) at 06/16/16 0950 Last data filed at 06/16/16 0600  Gross  per 24 hour  Intake             1855 ml  Output                0 ml  Net             1855 ml     Physical Exam  Awake Alert, Oriented X 3, No new F.N deficits, Normal affect [place].AT,PERRAL Supple Neck,No JVD, No cervical lymphadenopathy appriciated.  Symmetrical Chest wall movement, Good air movement bilaterally, CTAB RRR,No Gallops,Rubs or new Murmurs, No Parasternal Heave +ve B.Sounds, Abd Soft, No tenderness, No organomegaly appriciated, No rebound - guarding or rigidity. No Cyanosis, Clubbing or edema, No new Rash or bruise        Data Review:      CBC  Recent Labs Lab 06/09/16 1421 06/10/16 0411 06/11/16 0449 06/15/16 2250 06/16/16 0418  WBC 5.0 6.4 4.6 3.8* 3.1*  HGB 13.1 10.2* 10.8* 10.7* 10.9*  HCT 42.6 33.3* 35.3* 35.1* 37.2  PLT 133* 115* 104* 156 153  MCV 98.6 95.4 97.8 99.4 100.3*  MCH 30.3 29.2 29.9 30.3 29.4  MCHC 30.8 30.6 30.6 30.5 29.3*  RDW 13.9 14.0 14.1 13.9 14.0  LYMPHSABS 1.0  --   --  1.0 1.2  MONOABS 0.5  --   --  0.3 0.3  EOSABS 0.0  --   --  0.1 0.0  BASOSABS 0.0  --   --  0.0 0.0    Chemistries   Recent Labs Lab 06/09/16 1421 06/09/16 1802 06/10/16 0411 06/11/16 0422 06/15/16 2250 06/16/16 0418  NA 139  --  139 137 140 139  K 5.5*  --  4.7 4.9 5.3* 5.7*  CL 98*  --  101 100* 102 103  CO2 33*  --  34* 32 33* 29  GLUCOSE 229*  --  261* 230* 226* 185*  BUN 26*  --  28* 24* 22* 22*  CREATININE 1.16*  --  1.11* 1.08* 1.15* 1.03*  CALCIUM 9.3  --  8.5* 8.2* 9.6 9.3  MG  --  1.7  --   --   --   --   AST 15  --  14*  --  11* 16  ALT 10*  --  11*  --  10* 10*  ALKPHOS 58  --  40  --  45 43  BILITOT 0.9  --  1.2  --  0.9 0.9   ------------------------------------------------------------------------------------------------------------------ No results for input(s): CHOL, HDL, LDLCALC, TRIG, CHOLHDL, LDLDIRECT in the last 72 hours.  Lab Results  Component Value Date   HGBA1C 9.4 (H) 06/09/2016   ------------------------------------------------------------------------------------------------------------------ No results for input(s): TSH, T4TOTAL, T3FREE, THYROIDAB in the last 72 hours.  Invalid input(s): FREET3 ------------------------------------------------------------------------------------------------------------------ No results for input(s): VITAMINB12, FOLATE, FERRITIN, TIBC, IRON, RETICCTPCT in the last 72 hours.  Coagulation profile No results for input(s): INR, PROTIME in the last 168 hours.  No results for input(s): DDIMER in the last 72 hours.  Cardiac  Enzymes  Recent Labs Lab 06/09/16 1802 06/09/16 2232 06/15/16 2250  TROPONINI 0.03* <0.03 <0.03   ------------------------------------------------------------------------------------------------------------------    Component Value Date/Time   BNP 233.2 (H) 06/15/2016 2250    Micro Results Recent Results (from the past 240 hour(s))  Blood culture (routine x 2)     Status: None   Collection Time: 06/09/16  2:22 PM  Result Value Ref Range Status   Specimen Description BLOOD LEFT ARM  Final   Special Requests BOTTLES DRAWN AEROBIC  AND ANAEROBIC 5 CC EA  Final   Culture   Final    NO GROWTH 5 DAYS Performed at [facility], [place] [address], [place], [place] [address]    Report Status 06/14/2016 FINAL  Final  Blood culture (routine x 2)     Status: None   Collection Time: 06/09/16  2:22 PM  Result Value Ref Range Status   Specimen Description BLOOD RIGHT ARM  Final   Special Requests IN PEDIATRIC BOTTLE  Final   Culture   Final    NO GROWTH 5 DAYS Performed at [facility], [place] [address] [place], [place] [address]    Report Status 06/14/2016 FINAL  Final  Culture, blood (routine x 2) Call MD if unable to obtain prior to antibiotics being given     Status: None   Collection Time: 06/09/16  8:13 PM  Result Value Ref Range Status   Specimen Description BLOOD LEFT HAND  Final   Special Requests BOTTLES DRAWN AEROBIC ONLY 5CC  Final   Culture   Final    NO GROWTH 5 DAYS Performed at [facility], [place] [address], [place], [place] [address]    Report Status 06/14/2016 FINAL  Final  Respiratory Panel by PCR     Status: None   Collection Time: 06/09/16  9:54 PM  Result Value Ref Range Status   Adenovirus NOT DETECTED NOT DETECTED Final   Coronavirus 229E NOT DETECTED NOT DETECTED Final   Coronavirus HKU1 NOT DETECTED NOT DETECTED Final   Coronavirus NL63 NOT DETECTED NOT DETECTED Final   Coronavirus OC43 NOT DETECTED NOT DETECTED Final   Metapneumovirus NOT  DETECTED NOT DETECTED Final   Rhinovirus / Enterovirus NOT DETECTED NOT DETECTED Final   Influenza A NOT DETECTED NOT DETECTED Final   Influenza B NOT DETECTED NOT DETECTED Final   Parainfluenza Virus 1 NOT DETECTED NOT DETECTED Final   Parainfluenza Virus 2 NOT DETECTED NOT DETECTED Final   Parainfluenza Virus 3 NOT DETECTED NOT DETECTED Final   Parainfluenza Virus 4 NOT DETECTED NOT DETECTED Final   Respiratory Syncytial Virus NOT DETECTED NOT DETECTED Final   Bordetella pertussis NOT DETECTED NOT DETECTED Final   Chlamydophila pneumoniae NOT DETECTED NOT DETECTED Final   Mycoplasma pneumoniae NOT DETECTED NOT DETECTED Final    Comment: Performed at [facility], [place] [address] [place], [place] [address]  Culture, sputum-assessment     Status: None   Collection Time: 06/10/16  8:59 AM  Result Value Ref Range Status   Specimen Description SPUTUM  Final   Special Requests NONE  Final   Sputum evaluation THIS SPECIMEN IS ACCEPTABLE FOR SPUTUM CULTURE  Final   Report Status 06/10/2016 FINAL  Final  Culture, respiratory (NON-Expectorated)     Status: None   Collection Time: 06/10/16  8:59 AM  Result Value Ref Range Status   Specimen Description SPUTUM  Final   Special Requests NONE Reflexed from [id number]  Final   Gram Stain   Final    FEW WBC PRESENT,BOTH PMN AND MONONUCLEAR RARE GRAM POSITIVE COCCI RARE GRAM NEGATIVE RODS    Culture   Final    Consistent with normal respiratory flora. Performed at [facility], [place] [address] [place],  [address]    Report Status 06/12/2016 FINAL  Final  MRSA PCR Screening     Status: None   Collection Time: 06/16/16  4:12 AM  Result Value Ref Range Status   MRSA  by PCR NEGATIVE NEGATIVE Final    Comment:        The GeneXpert MRSA Assay (FDA approved for NASAL specimens only), is one component of a comprehensive MRSA colonization surveillance program. It is not intended to diagnose MRSA infection nor to guide or monitor  treatment for MRSA infections.     Radiology Reports Dg Chest 2 View  Result Date: 06/09/2016 CLINICAL DATA:  Fever and cough EXAM: CHEST  2 VIEW COMPARISON:  December 11, 2015 FINDINGS: There is consolidation in the posterior left base region. Lungs elsewhere clear. Heart is mildly enlarged. The pulmonary vascularity has a pattern suggesting a degree of pulmonary arterial hypertension. No adenopathy evident. No bone lesions. IMPRESSION: Posterior left base consolidation consistent with pneumonia. Mild cardiomegaly. Suspect a degree of pulmonary arterial hypertension. Followup PA and lateral chest radiographs recommended in 3-4 weeks following trial of antibiotic therapy to ensure resolution and exclude underlying malignancy. Electronically Signed   By: Lowella Grip III M.D.   On: 06/09/2016 15:09   Ct Head Wo Contrast  Result Date: 06/16/2016 CLINICAL DATA:  Altered mental status EXAM: CT HEAD WITHOUT CONTRAST TECHNIQUE: Contiguous axial images were obtained from the base of the skull through the vertex without intravenous contrast. COMPARISON:  12/11/2015 CT FINDINGS: Brain: Mild sulcal prominence bilaterally consistent superficial atrophy. Minimal small vessel ischemic disease of periventricular white matter. No large vascular territory infarction, hemorrhage or midline shift. Left basal ganglial lacunar infarcts appear chronic. Fourth ventricle is midline. No extra-axial fluid collections. Vascular: Moderate atherosclerosis of the carotid siphons. No hyperdense vessels. Skull: Normal. Negative for fracture or focal lesion. Osteopenic appearance of the bony calvarium. Sinuses/Orbits: Inferior mastoid air cells are partially opacified consistent with a mastoid effusion. Minimal ethmoid sinus mucosal thickening. The visualized sphenoid and maxillary sinuses as well as the frontal sinus appear clear. Other: None IMPRESSION: No acute intracranial abnormality. Chronic small vessel ischemic disease and  left-sided basal ganglial lacunar infarcts. Chronic small mastoid effusion on the right. Moderate atherosclerosis of carotid siphons Electronically Signed   By: Ashley Royalty M.D.   On: 06/16/2016 00:25   Dg Chest Port 1 View  Result Date: 06/15/2016 CLINICAL DATA:  Lethargic tonight. Discharge from hospital for days ago with urinary tract infection and pneumonia. EXAM: PORTABLE CHEST 1 VIEW COMPARISON:  06/09/2016 FINDINGS: Shallow inspiration. Cardiac enlargement with mild pulmonary vascular congestion. Bilateral perihilar infiltrates are progressing since previous study and may indicate edema or progressing pneumonia. Small bilateral pleural effusions. Calcified and tortuous aorta. No pneumothorax. IMPRESSION: Progression of perihilar infiltrates may represent developing edema or progressing pneumonia. Small pleural effusions. Electronically Signed   By: Lucienne Capers M.D.   On: 06/15/2016 23:19    Time Spent in minutes  30   SINGH,PRASHANT K M.D on 06/16/2016 at 9:50 AM  Between 7am to 7pm - Pager - [phone]  After 7pm go to [email or web address] - password [facility]  [facility] -  Office  [phone]

## 2016-06-16 NOTE — Progress Notes (Signed)
Patient placed back on BiPAP at this time per MD order. To remain on BiPAP until MD states otherwise. RN aware.

## 2016-06-16 NOTE — Progress Notes (Signed)
SLP Cancellation Note  Patient Details Name: Elizabeth Pugh MRN: [id number] DOB: December 07, 1926   Cancelled treatment:       Reason Eval/Treat Not Completed: Medical issues which prohibited therapy; pt remains on Bipap and not appropriate for clinical swallow evaluation.  If respiratory status improves and if schedule permits, will reattempt this afternoon.   Juan Quam Laurice 06/16/2016, 10:14 AM

## 2016-06-16 NOTE — Consult Note (Addendum)
[facility] Nurse wound consult note Reason for Consult: Consult requested for bilat buttocks Wound type: Pt has an elevated BMI and is difficult to turn; required 3 person assist.  She is high risk for skin breakdown related to constant moisture and immobility and could benefit from a foley to contain urine; please order if desired. Bilat buttocks red and moist with patchy areas of loose peeling skin and red moist partial thickness skin loss; appearance consistent with moisture associated skin damage.  Sacrum with deep [place]; no breakdown at this site. She also has multiple areas of peau 'de orange skin changes, red, raised puckered areas to upper thighs, hips, buttocks; appearance consistent with chronic skin changes related to  lymphedema; weeping small amt yellow drainage. Pressure Injury POA: These are NOT pressure injuries and were present on admission. Dressing procedure/placement/frequency: Barrier cream to protect and repel moisture to bilat buttocks and sacrum. Bariatric air mattress to decrease pressure.  Family members at the bedside to assess skin during turning and cleaning and discuss plan of care. Please re-consult if further assistance is needed.  Thank-you,  Julien Girt MSN, [place], [place], [place], [place]

## 2016-06-16 NOTE — Progress Notes (Signed)
ABG obtained on patient.  Pt CO2 is high.  Placed on Bipap to reduce CO2 per MD.  Pt tolerating well at this time.  RT will continue to monitor.

## 2016-06-16 NOTE — ED Notes (Signed)
Lab to add on BNP.  °

## 2016-06-16 NOTE — Progress Notes (Addendum)
ANTICOAGULATION/ANTIBIOTIC CONSULT NOTE - Initial Consult  Pharmacy Consult for Vancomycin, Cefepime and Heparin Indication: HCAP and afib  Allergies  Allergen Reactions  . Erythromycin Other (See Comments)    Vaginal itching  . Penicillins Rash and Other (See Comments)    Has patient had a PCN reaction causing immediate rash, facial/tongue/throat swelling, SOB or lightheadedness with hypotension: No Has patient had a PCN reaction causing severe rash involving mucus membranes or skin necrosis: No Has patient had a PCN reaction that required hospitalization: No Has patient had a PCN reaction occurring within the last 10 years: No If all of the above answers are "NO", then may proceed with Cephalosporin use.  Marland Kitchen Zithromax [Azithromycin] Other (See Comments)    gave her a yeast infection.    Patient Measurements: Height: 5\' 7"  (170.2 cm) Weight: (!) 315 lb 14.7 oz (143.3 kg) (From Aug 2017 records, must be updated) IBW/kg (Calculated) : 61.6 Heparin Dosing Weight: 97 kg  Vital Signs: Temp: 97.6 F (36.4 C) (02/13 0300) Temp Source: Oral (02/13 0300) BP: 137/82 (02/13 0407) Pulse Rate: 77 (02/13 0407)  Labs:  Recent Labs  06/15/16 2250  HGB 10.7*  HCT 35.1*  PLT 156  CREATININE 1.15*  TROPONINI <0.03    Estimated Creatinine Clearance: 51.3 mL/min (by C-G formula based on SCr of 1.15 mg/dL (H)).   Medical History: Past Medical History:  Diagnosis Date  . Acute delirium 04/12/2011  . Acute kidney injury ([place]) 09/02/2015  . Anemia   . Anxiety   . Arthritis   . Atrial fibrillation ([place])   . Blood transfusion   . Bronchitis   . CHF (congestive heart failure) ([place])   . Diabetes mellitus   . GERD (gastroesophageal reflux disease)   . GI (gastrointestinal bleed)    Due to benign mass  . Hypertension   . Lobar pneumonia due to unspecified organism 09/02/2015  . On home oxygen therapy    "4L; 24/7" (12/11/2015)  . Restless leg syndrome   . Seizures ([place])   . Stroke  [facility])     Medications:  Prescriptions Prior to Admission  Medication Sig Dispense Refill Last Dose  . albuterol (PROVENTIL HFA;VENTOLIN HFA) 108 (90 BASE) MCG/ACT inhaler Inhale 1 puff into the lungs every 6 (six) hours as needed for wheezing or shortness of breath.   06/15/2016 at Unknown time  . ALPRAZolam (XANAX) 0.25 MG tablet Take 1 tablet (0.25 mg total) by mouth 2 (two) times daily as needed for anxiety. 30 tablet 0 06/15/2016 at Unknown time  . apixaban (ELIQUIS) 5 MG TABS tablet Take 1 tablet (5 mg total) by mouth 2 (two) times daily. 60 tablet 0 06/15/2016 at 2100  . calcium-vitamin D (CALCIUM 500+D) 500-400 MG-UNIT per tablet Take 1 tablet by mouth 2 (two) times daily.   06/15/2016 at Unknown time  . carvedilol (COREG) 3.125 MG tablet Take 1 tablet (3.125 mg total) by mouth 2 (two) times daily with a meal. 60 tablet 0 06/15/2016 at 2100  . cefdinir (OMNICEF) 300 MG capsule Take 1 capsule (300 mg total) by mouth 2 (two) times daily. 14 capsule 0 06/15/2016 at Unknown time  . Docusate Calcium (STOOL SOFTENER PO) Take 1 capsule by mouth daily as needed (For constipation.).    06/15/2016 at Unknown time  . ferrous sulfate 325 (65 FE) MG tablet Take 325 mg by mouth daily with supper.    06/15/2016 at Unknown time  . furosemide (LASIX) 40 MG tablet Take 40 mg by mouth 2 (two)  times daily.   06/15/2016 at Unknown time  . guaiFENesin (MUCINEX) 600 MG 12 hr tablet Take 2 tablets (1,200 mg total) by mouth 2 (two) times daily. 20 tablet 0 06/15/2016 at Unknown time  . hydrOXYzine (ATARAX/VISTARIL) 25 MG tablet Take 25 mg by mouth every 8 (eight) hours as needed for itching.    06/15/2016 at Unknown time  . insulin aspart (NOVOLOG) 100 UNIT/ML injection Inject 0-12 Units into the skin as needed for high blood sugar. Give if blood sugar is greater than 150.   06/15/2016 at Unknown time  . insulin glargine (LANTUS) 100 UNIT/ML injection Inject 0.15 mLs (15 Units total) into the skin at bedtime. (Patient taking  differently: Inject 30 Units into the skin at bedtime. ) 10 mL 0 06/15/2016 at Unknown time  . ipratropium-albuterol (DUONEB) 0.5-2.5 (3) MG/3ML SOLN Take 3 mLs by nebulization every 6 (six) hours as needed. (Patient taking differently: Take 3 mLs by nebulization every 6 (six) hours as needed (For shortness of breath.). ) 360 mL 0 06/15/2016 at Unknown time  . levETIRAcetam (KEPPRA) 500 MG tablet Take 1 tablet (500 mg total) by mouth 2 (two) times daily. 30 tablet 2 06/15/2016 at Unknown time  . Naphazoline-Pheniramine (ALLERGY EYE OP) Place 1 drop into both eyes as needed (For eye irritation.).   Past Week at Unknown time  . polyethylene glycol (MIRALAX / GLYCOLAX) packet Take 17 g by mouth daily as needed for mild constipation. 14 each 0 06/15/2016 at Unknown time  . pregabalin (LYRICA) 75 MG capsule Take 75 mg by mouth at bedtime.    06/14/2016 at Unknown time  . rOPINIRole (REQUIP) 2 MG tablet Take 1 tablet (2 mg total) by mouth 3 (three) times daily. 30 tablet 0 06/15/2016 at Unknown time  . Vitamin D, Ergocalciferol, (DRISDOL) 50000 units CAPS capsule Take 50,000 Units by mouth every Saturday. No specific day    Past Week at Unknown time  . VOLTAREN 1 % GEL Apply 1 application topically 4 (four) times daily as needed (For knee pain.).    06/15/2016 at Unknown time  . pantoprazole (PROTONIX) 40 MG tablet Take 1 tablet (40 mg total) by mouth 2 (two) times daily before a meal. 60 tablet 0 06/09/2016    Assessment: 83 y.o. F presents with AMS. Noted pt with recent admission for PNA/UTI (treated with Rocephin and Azithromycin)- discharged 4 days ago.   AC: Pt on apixaban PTA for afib. Last dose taken 2/12 2100. Holding apixaban for now and starting heparin gtt. CBC stable. Will get baseline heparin level and PTT. Will utilize PTT for monitoring heparin as apixaban likely affecting heparin level.  ID: To begin vancomycin and cefepime for HCAP. Day #1/8. Estimated normalized CrCl ~40 ml/min.  Antimicrobials  this admission:  2/13 Vanc >>  2/13 Cefepime >>   Dose adjustments this admission:  n/a  Microbiology results:  2/12 BCx x2:  2/13 UCx:     Goal of Therapy:  Heparin level 0.3-0.7 units/ml Monitor platelets by anticoagulation protocol: Yes  Vancomycin trough 15-20 mcg/ml   Plan:  Baseline heparin level and PTT At 0900 (12 hours post last apixaban dose), start heparin gtt at 1600 units/hr Will f/u 8hr PTT  Daily heparin level, PTT, and CBC Vancomyin 1750mg  IV q24h - start first dose now (as only 1gm load given) Will f/u micro data, renal function, and pt's clinical condition Vanc trough at Css  Sherlon Handing, PharmD, BCPS Clinical pharmacist, pager [phone] 06/16/2016,4:36 AM   Addendum: Baseline  heparin level > 2.2 (affected by Eliquis). Baseline PTT 36 sec.  Sherlon Handing, PharmD, BCPS Clinical pharmacist, pager [phone] 06/16/2016 6:14 AM

## 2016-06-17 DIAGNOSIS — J9602 Acute respiratory failure with hypercapnia: Secondary | ICD-10-CM

## 2016-06-17 DIAGNOSIS — G9340 Encephalopathy, unspecified: Secondary | ICD-10-CM

## 2016-06-17 DIAGNOSIS — J9601 Acute respiratory failure with hypoxia: Secondary | ICD-10-CM

## 2016-06-17 LAB — CBC
HEMATOCRIT: 31.7 % — AB (ref 36.0–46.0)
HEMOGLOBIN: 9.9 g/dL — AB (ref 12.0–15.0)
MCH: 30.1 pg (ref 26.0–34.0)
MCHC: 31.2 g/dL (ref 30.0–36.0)
MCV: 96.4 fL (ref 78.0–100.0)
Platelets: 119 10*3/uL — ABNORMAL LOW (ref 150–400)
RBC: 3.29 MIL/uL — AB (ref 3.87–5.11)
RDW: 14.2 % (ref 11.5–15.5)
WBC: 3 10*3/uL — ABNORMAL LOW (ref 4.0–10.5)

## 2016-06-17 LAB — BASIC METABOLIC PANEL
Anion gap: 9 (ref 5–15)
BUN: 20 mg/dL (ref 6–20)
CHLORIDE: 103 mmol/L (ref 101–111)
CO2: 29 mmol/L (ref 22–32)
CREATININE: 1.02 mg/dL — AB (ref 0.44–1.00)
Calcium: 8.7 mg/dL — ABNORMAL LOW (ref 8.9–10.3)
GFR calc Af Amer: 56 mL/min — ABNORMAL LOW (ref >60)
GFR calc non Af Amer: 48 mL/min — ABNORMAL LOW (ref >60)
GLUCOSE: 163 mg/dL — AB (ref 65–99)
POTASSIUM: 4.1 mmol/L (ref 3.5–5.1)
SODIUM: 141 mmol/L (ref 135–145)

## 2016-06-17 LAB — APTT: aPTT: 102 seconds — ABNORMAL HIGH (ref 24–36)

## 2016-06-17 LAB — HEPARIN LEVEL (UNFRACTIONATED): Heparin Unfractionated: 1.28 IU/mL — ABNORMAL HIGH (ref 0.30–0.70)

## 2016-06-17 MED ORDER — LEVETIRACETAM 500 MG PO TABS
500.0000 mg | ORAL_TABLET | Freq: Two times a day (BID) | ORAL | Status: DC
  Administered 2016-06-17 – 2016-06-23 (×12): 500 mg via ORAL
  Filled 2016-06-17 (×12): qty 1

## 2016-06-17 MED ORDER — APIXABAN 5 MG PO TABS
5.0000 mg | ORAL_TABLET | Freq: Two times a day (BID) | ORAL | Status: DC
  Administered 2016-06-17 – 2016-06-23 (×13): 5 mg via ORAL
  Filled 2016-06-17 (×13): qty 1

## 2016-06-17 MED ORDER — METOPROLOL TARTRATE 12.5 MG HALF TABLET
12.5000 mg | ORAL_TABLET | Freq: Two times a day (BID) | ORAL | Status: DC
  Administered 2016-06-17 – 2016-06-23 (×12): 12.5 mg via ORAL
  Filled 2016-06-17 (×12): qty 1

## 2016-06-17 NOTE — Evaluation (Signed)
Clinical/Bedside Swallow Evaluation Patient Details  Name: ADIANA HARTNER MRN: [id number] Date of Birth: 05-21-1926  Today's Date: 06/17/2016 Time: SLP Start Time (ACUTE ONLY): 1045 SLP Stop Time (ACUTE ONLY): 1108 SLP Time Calculation (min) (ACUTE ONLY): 23 min  Past Medical History:  Past Medical History:  Diagnosis Date  . Acute delirium 04/12/2011  . Acute kidney injury ([place]) 09/02/2015  . Anemia   . Anxiety   . Arthritis   . Atrial fibrillation ([place])   . Blood transfusion   . Bronchitis   . CHF (congestive heart failure) ([place])   . Diabetes mellitus   . GERD (gastroesophageal reflux disease)   . GI (gastrointestinal bleed)    Due to benign mass  . Hypertension   . Lobar pneumonia due to unspecified organism 09/02/2015  . On home oxygen therapy    "4L; 24/7" (12/11/2015)  . Restless leg syndrome   . Seizures ([place])   . Stroke [facility])    Past Surgical History:  Past Surgical History:  Procedure Laterality Date  . CATARACT EXTRACTION W/ INTRAOCULAR LENS IMPLANT     "Dr. Gershon Crane; think they did the right one"  . EUS  09/24/2011   Procedure: UPPER ENDOSCOPIC ULTRASOUND (EUS) LINEAR;  Surgeon: Beryle Beams, MD;  Location: [facility] ENDOSCOPY;  Service: Endoscopy;  Laterality: N/A;  . TONSILLECTOMY  1935  . TUBAL LIGATION     HPI:  Dannell P Hauseris a 83 y.o.femalewith recently admitted for pneumonia discharged 5 days ago was brought to the ER after patient's family found that patient has become very lethargic and confused last evening. He was found to have PNAand hypercarbic respiratory failure with encephalopathy. MD suspects infiltrates are edema. Pt seen by SLP in 2013 with no finding of dysphagia.    Assessment / Plan / Recommendation Clinical Impression  Pt demonstrates excellent tolerance of thin liquids over many trials with no sign of dysphagia or aspiration. She does require soft foods due to missing dentition and her daughter typically chops her foods at home. Currently  she is on a soft diet with thin liquids which is adequate. Will sign off.     Aspiration Risk  Mild aspiration risk    Diet Recommendation Dysphagia 3 (Mech soft);Thin liquid   Liquid Administration via: Cup;Straw Medication Administration: Whole meds with liquid Supervision: Patient able to self feed Compensations: Minimize environmental distractions Postural Changes: Seated upright at 90 degrees    Other  Recommendations Oral Care Recommendations: Oral care BID   Follow up Recommendations None      Frequency and Duration            Prognosis        Swallow Study   General HPI: Nisreen P Hauseris a 83 y.o.femalewith recently admitted for pneumonia discharged 5 days ago was brought to the ER after patient's family found that patient has become very lethargic and confused last evening. He was found to have PNAand hypercarbic respiratory failure with encephalopathy. MD suspects infiltrates are edema. Pt seen by SLP in 2013 with no finding of dysphagia.  Type of Study: Bedside Swallow Evaluation Previous Swallow Assessment: 2013 normal swallow function Diet Prior to this Study: Dysphagia 3 (soft);Thin liquids Temperature Spikes Noted: No Respiratory Status: Nasal cannula History of Recent Intubation: No Behavior/Cognition: Alert Oral Cavity - Dentition: Edentulous Vision: Functional for self-feeding Self-Feeding Abilities: Able to feed self Patient Positioning: Upright in bed Baseline Vocal Quality: Hoarse Volitional Cough: Strong Volitional Swallow: Able to elicit    Oral/Motor/Sensory Function  Ice Chips     Thin Liquid Thin Liquid: Within functional limits    Nectar Thick Nectar Thick Liquid: Not tested   Honey Thick Honey Thick Liquid: Not tested   Puree Puree: Within functional limits   Solid   GO   Solid: Impaired Presentation: Self Fed Oral Phase Impairments: Impaired mastication Oral Phase Functional Implications: Impaired mastication         Eleni Frank, Katherene Ponto 06/17/2016,11:11 AM

## 2016-06-17 NOTE — Progress Notes (Signed)
Placed patient on Bipap for the night with IPAP set at 16cm and EPAP set at 8cm

## 2016-06-17 NOTE — Progress Notes (Signed)
PT already off BiPaP when RT arrived PT on 4L [place].  PT in no resp distress NO increased WOB PT denies SOB. VS within normal limits. RT will continue to monitor

## 2016-06-17 NOTE — Progress Notes (Addendum)
PROGRESS NOTE                                                                                                                                                                                                             Patient Demographics:    Elizabeth Pugh, is a 83 y.o. female, DOB - 1927/04/11, [id number]  Admit date - 06/15/2016   Admitting Physician Rise Patience, MD  Outpatient Primary MD for the patient is Philis Fendt, MD  LOS - 1  Brief Narrative  83 y.o. female recently admitted for pneumonia, discharged 5 days prior to this admission was brought back to the ER after patient's family found pt lethargic and confused. Pt found to have PNA and hypercarbic respiratory failure with encephalopathy. She was placed on BiPAP and antibiotics with improvement.   Subjective:   More alert this am, denies dyspnea or chest pain, reports feeling better.    Assessment  & Plan :   Acute hypercapnic respiratory failure due acute on chronic CHF, perihilar pneumonia, unknown pathogen - pt with underlying sleep apnea as well in the setting of morbid obesity, OHS - initially required BiPAP and also needed it last night - better this AM with oxygen sat's in low 90's on 2 L oxygen via [place] - pt says she is oxygen dependent at baseline, 2 L, will check with family to confirm  - possible transfer to telemetry unit today if respiratory status stable  - creatinine borderline normal, hold off on use of ACEi/ARB's for now - weight trend since admission, continue to monitor - continue Lasix 40 mg IV BID for now  Filed Weights   06/16/16 0300 06/16/16 0407 06/17/16 0500  Weight: (!) 143.3 kg (315 lb 14.7 oz) 135.3 kg (298 lb 4.5 oz) (!) 137.6 kg (303 lb 5.7 oz)   Acute metabolic encephalopathy  - appears to be multifactorial secondary to the above - mental status improving overall - Continue to treat pneumonia, CHF - If mental  status remains stable, plan to transfer to telemetry unit  Community acquired pneumonia, perihilar, present on admission, unknown pathogen - Patient started on vancomycin and maxipime, vancomycin has been discontinued 06/16/2016 - Patient currently on Maxipime and Levaquin, today is a day #3 of antibiotic treatment - No plan to change regimen at this time  UTI - follow-up on  final cultures, current antibiotics should be adequate in coverage  Morbid obesity - Body mass index is 48.96 kg/m.   Acute kidney injury, hyperkalemia - In the setting of the above - potassium is now within normal limits, continue to avoid ACEI - BMP in the morning  Chronic Atrial fibrillation - [place] vasc 2 score of 8. Continue beta blocker - Was on heparin drip, now that pt is more alert, plan to change to home regimen with Eliquis  Bilat buttocks skin damage, present on admission and not pressure injury  - pt with elevated BMI and is difficult to turn; required 3 person assist - high risk for skin breakdown related to constant moisture and immobility - Bilat buttocks red and moist with patchy areas of loose peeling skin and red moist partial thickness skin loss; appearance consistent with moisture associated skin damage - multiple areas of peau 'de orange skin changes, red, raised puckered areas to upper thighs, hips, buttocks; appearance consistent with chronic skin changes related to  lymphedema; weeping small amt yellow drainage. - WOC recommends Barrier cream to protect and repel moisture to bilat buttocks and sacrum. Bariatric air mattress to decrease pressure.   DM type II. Uncontrolled - Currently on sliding scale monitor.  Lab Results  Component Value Date   HGBA1C 9.4 (H) 06/09/2016   Pancytopenia - Partially reactive in the setting of acute illness, also please note that patient is on blood thinner - Currently no signs of active bleeding - Monitor CBC while inpatient  Diet : DIET SOFT Room  service appropriate? Yes; Fluid consistency: Thin    Family Communication  :  No family at bedside this AM   Code Status :  No Intubation  Disposition Plan  :  SNF in 2-3 days, PT/OT eval requested   Consults  :  None  Procedures  :  None  DVT Prophylaxis  :  Heparin gtt / Eliquis  Lab Results  Component Value Date   PLT 119 (L) 06/17/2016   Inpatient Medications  Scheduled Meds: . ceFEPime (MAXIPIME) IV  1 g Intravenous Q8H  . chlorhexidine  15 mL Mouth Rinse BID  . furosemide  40 mg Intravenous BID  . levETIRAcetam  500 mg Intravenous Q12H  . levofloxacin  500 mg Oral Daily  . mouth rinse  15 mL Mouth Rinse q12n4p  . metoprolol  2.5 mg Intravenous Q6H   Continuous Infusions: . heparin 1,200 Units/hr (06/17/16 0251)   PRN Meds:.acetaminophen **OR** acetaminophen, ipratropium-albuterol, ondansetron **OR** ondansetron (ZOFRAN) IV  Antibiotics  :    Anti-infectives    Start     Dose/Rate Route Frequency Ordered Stop   06/16/16 1200  levofloxacin (LEVAQUIN) tablet 500 mg     500 mg Oral Daily 06/16/16 0955 06/20/16 0959   06/16/16 0800  ceFEPIme (MAXIPIME) 1 g in dextrose 5 % 50 mL IVPB     1 g 100 mL/hr over 30 Minutes Intravenous Every 8 hours 06/16/16 0322 06/24/16 0559   06/16/16 0500  vancomycin (VANCOCIN) 1,750 mg in sodium chloride 0.9 % 500 mL IVPB  Status:  Discontinued     1,750 mg 250 mL/hr over 120 Minutes Intravenous Every 24 hours 06/16/16 0448 06/16/16 0955   06/16/16 0345  vancomycin (VANCOCIN) IVPB 1000 mg/200 mL premix  Status:  Discontinued     1,000 mg 200 mL/hr over 60 Minutes Intravenous  Once 06/16/16 0342 06/16/16 0447   06/16/16 0015  vancomycin (VANCOCIN) IVPB 1000 mg/200 mL premix  1,000 mg 200 mL/hr over 60 Minutes Intravenous  Once 06/16/16 0005 06/16/16 0257   06/16/16 0015  ceFEPIme (MAXIPIME) 2 g in dextrose 5 % 50 mL IVPB     2 g 100 mL/hr over 30 Minutes Intravenous  Once 06/16/16 0005 06/16/16 0118      Objective:   Vitals:    06/16/16 2318 06/17/16 0400 06/17/16 0500 06/17/16 0740  BP: 116/88 123/67  (!) 116/96  Pulse: 91 93  86  Resp: 19 16  (!) 21  Temp: 97.9 F (36.6 C)   97.6 F (36.4 C)  TempSrc: Oral   Axillary  SpO2: 99% 99%  100%  Weight:   (!) 137.6 kg (303 lb 5.7 oz)   Height:        Wt Readings from Last 3 Encounters:  06/17/16 (!) 137.6 kg (303 lb 5.7 oz)  12/17/15 (!) 143.3 kg (316 lb)  10/14/15 127.5 kg (281 lb)     Intake/Output Summary (Last 24 hours) at 06/17/16 1106 Last data filed at 06/17/16 1000  Gross per 24 hour  Intake          1072.53 ml  Output             1650 ml  Net          -577.47 ml   Physical Exam Constitutional: Appears well-developed. No distress.  CVS: RRR, S1/S2 +, no murmurs, no gallops, no carotid bruit.  Pulmonary: diminished breath sounds at wheezing Abdominal: Soft. BS +,  no distension, tenderness, rebound or guarding.  Musculoskeletal: Normal range of motion.    Data Review:   CBC  Recent Labs Lab 06/11/16 0449 06/15/16 2250 06/16/16 0418 06/17/16 0814  WBC 4.6 3.8* 3.1* 3.0*  HGB 10.8* 10.7* 10.9* 9.9*  HCT 35.3* 35.1* 37.2 31.7*  PLT 104* 156 153 119*  MCV 97.8 99.4 100.3* 96.4  MCH 29.9 30.3 29.4 30.1  MCHC 30.6 30.5 29.3* 31.2  RDW 14.1 13.9 14.0 14.2  LYMPHSABS  --  1.0 1.2  --   MONOABS  --  0.3 0.3  --   EOSABS  --  0.1 0.0  --   BASOSABS  --  0.0 0.0  --     Chemistries   Recent Labs Lab 06/11/16 0422 06/15/16 2250 06/16/16 0418 06/17/16 0814  NA 137 140 139 141  K 4.9 5.3* 5.7* 4.1  CL 100* 102 103 103  CO2 32 33* 29 29  GLUCOSE 230* 226* 185* 163*  BUN 24* 22* 22* 20  CREATININE 1.08* 1.15* 1.03* 1.02*  CALCIUM 8.2* 9.6 9.3 8.7*  AST  --  11* 16  --   ALT  --  10* 10*  --   ALKPHOS  --  45 43  --   BILITOT  --  0.9 0.9  --    Lab Results  Component Value Date   HGBA1C 9.4 (H) 06/09/2016   Cardiac Enzymes  Recent Labs Lab 06/15/16 2250  TROPONINI <0.03    ------------------------------------------------------------------------------------------------------------------    Component Value Date/Time   BNP 233.2 (H) 06/15/2016 2250   Micro Results Recent Results (from the past 240 hour(s))  Blood culture (routine x 2)     Status: None   Collection Time: 06/09/16  2:22 PM  Result Value Ref Range Status   Specimen Description BLOOD LEFT ARM  Final   Special Requests BOTTLES DRAWN AEROBIC AND ANAEROBIC 5 CC EA  Final   Culture   Final    NO GROWTH 5  DAYS Performed at [facility], [place] [address] [place], [place] [address]    Report Status 06/14/2016 FINAL  Final  Blood culture (routine x 2)     Status: None   Collection Time: 06/09/16  2:22 PM  Result Value Ref Range Status   Specimen Description BLOOD RIGHT ARM  Final   Special Requests IN PEDIATRIC BOTTLE  Final   Culture   Final    NO GROWTH 5 DAYS Performed at [facility], [place] [address], [place], [place] [address]    Report Status 06/14/2016 FINAL  Final  Culture, blood (routine x 2) Call MD if unable to obtain prior to antibiotics being given     Status: None   Collection Time: 06/09/16  8:13 PM  Result Value Ref Range Status   Specimen Description BLOOD LEFT HAND  Final   Special Requests BOTTLES DRAWN AEROBIC ONLY 5CC  Final   Culture   Final    NO GROWTH 5 DAYS Performed at [facility], [place] [address] [place], [place] [address]    Report Status 06/14/2016 FINAL  Final  Respiratory Panel by PCR     Status: None   Collection Time: 06/09/16  9:54 PM  Result Value Ref Range Status   Adenovirus NOT DETECTED NOT DETECTED Final   Coronavirus 229E NOT DETECTED NOT DETECTED Final   Coronavirus HKU1 NOT DETECTED NOT DETECTED Final   Coronavirus NL63 NOT DETECTED NOT DETECTED Final   Coronavirus OC43 NOT DETECTED NOT DETECTED Final   Metapneumovirus NOT DETECTED NOT DETECTED Final   Rhinovirus / Enterovirus NOT DETECTED NOT DETECTED Final   Influenza A  NOT DETECTED NOT DETECTED Final   Influenza B NOT DETECTED NOT DETECTED Final   Parainfluenza Virus 1 NOT DETECTED NOT DETECTED Final   Parainfluenza Virus 2 NOT DETECTED NOT DETECTED Final   Parainfluenza Virus 3 NOT DETECTED NOT DETECTED Final   Parainfluenza Virus 4 NOT DETECTED NOT DETECTED Final   Respiratory Syncytial Virus NOT DETECTED NOT DETECTED Final   Bordetella pertussis NOT DETECTED NOT DETECTED Final   Chlamydophila pneumoniae NOT DETECTED NOT DETECTED Final   Mycoplasma pneumoniae NOT DETECTED NOT DETECTED Final    Comment: Performed at [facility], [place] [address] [place], [place] [address]  Culture, sputum-assessment     Status: None   Collection Time: 06/10/16  8:59 AM  Result Value Ref Range Status   Specimen Description SPUTUM  Final   Special Requests NONE  Final   Sputum evaluation THIS SPECIMEN IS ACCEPTABLE FOR SPUTUM CULTURE  Final   Report Status 06/10/2016 FINAL  Final  Culture, respiratory (NON-Expectorated)     Status: None   Collection Time: 06/10/16  8:59 AM  Result Value Ref Range Status   Specimen Description SPUTUM  Final   Special Requests NONE Reflexed from [id number]  Final   Gram Stain   Final    FEW WBC PRESENT,BOTH PMN AND MONONUCLEAR RARE GRAM POSITIVE COCCI RARE GRAM NEGATIVE RODS    Culture   Final    Consistent with normal respiratory flora. Performed at [facility], [place] [address] [place], [place] [address]    Report Status 06/12/2016 FINAL  Final  MRSA PCR Screening     Status: None   Collection Time: 06/16/16  4:12 AM  Result Value Ref Range Status   MRSA by PCR NEGATIVE NEGATIVE Final    Comment:        The GeneXpert MRSA  Assay (FDA approved for NASAL specimens only), is one component of a comprehensive MRSA colonization surveillance program. It is not intended to diagnose MRSA infection nor to guide or monitor treatment for MRSA infections.     Radiology Reports Dg Chest 2 View  Result Date:  06/09/2016 CLINICAL DATA:  Fever and cough EXAM: CHEST  2 VIEW COMPARISON:  December 11, 2015 FINDINGS: There is consolidation in the posterior left base region. Lungs elsewhere clear. Heart is mildly enlarged. The pulmonary vascularity has a pattern suggesting a degree of pulmonary arterial hypertension. No adenopathy evident. No bone lesions. IMPRESSION: Posterior left base consolidation consistent with pneumonia. Mild cardiomegaly. Suspect a degree of pulmonary arterial hypertension. Followup PA and lateral chest radiographs recommended in 3-4 weeks following trial of antibiotic therapy to ensure resolution and exclude underlying malignancy. Electronically Signed   By: Lowella Grip III M.D.   On: 06/09/2016 15:09   Ct Head Wo Contrast  Result Date: 06/16/2016 CLINICAL DATA:  Altered mental status EXAM: CT HEAD WITHOUT CONTRAST TECHNIQUE: Contiguous axial images were obtained from the base of the skull through the vertex without intravenous contrast. COMPARISON:  12/11/2015 CT FINDINGS: Brain: Mild sulcal prominence bilaterally consistent superficial atrophy. Minimal small vessel ischemic disease of periventricular white matter. No large vascular territory infarction, hemorrhage or midline shift. Left basal ganglial lacunar infarcts appear chronic. Fourth ventricle is midline. No extra-axial fluid collections. Vascular: Moderate atherosclerosis of the carotid siphons. No hyperdense vessels. Skull: Normal. Negative for fracture or focal lesion. Osteopenic appearance of the bony calvarium. Sinuses/Orbits: Inferior mastoid air cells are partially opacified consistent with a mastoid effusion. Minimal ethmoid sinus mucosal thickening. The visualized sphenoid and maxillary sinuses as well as the frontal sinus appear clear. Other: None IMPRESSION: No acute intracranial abnormality. Chronic small vessel ischemic disease and left-sided basal ganglial lacunar infarcts. Chronic small mastoid effusion on the right.  Moderate atherosclerosis of carotid siphons Electronically Signed   By: Ashley Royalty M.D.   On: 06/16/2016 00:25   Dg Chest Port 1 View  Result Date: 06/15/2016 CLINICAL DATA:  Lethargic tonight. Discharge from hospital for days ago with urinary tract infection and pneumonia. EXAM: PORTABLE CHEST 1 VIEW COMPARISON:  06/09/2016 FINDINGS: Shallow inspiration. Cardiac enlargement with mild pulmonary vascular congestion. Bilateral perihilar infiltrates are progressing since previous study and may indicate edema or progressing pneumonia. Small bilateral pleural effusions. Calcified and tortuous aorta. No pneumothorax. IMPRESSION: Progression of perihilar infiltrates may represent developing edema or progressing pneumonia. Small pleural effusions. Electronically Signed   By: Lucienne Capers M.D.   On: 06/15/2016 23:19   Time Spent in minutes  30  Faye Ramsay M.D on 06/17/2016 at 11:06 AM  Between 7am to 7pm - Pager - [phone]  After 7pm go to [email or web address] - password [facility]  [facility] -  Office  [phone]

## 2016-06-17 NOTE — Progress Notes (Signed)
ANTICOAGULATION CONSULT NOTE - Follow Up Consult  Pharmacy Consult for Heparin Indication: atrial fibrillation  Patient Measurements: Height: 5\' 6"  (167.6 cm) Weight: (!) 303 lb 5.7 oz (137.6 kg) IBW/kg (Calculated) : 59.3 Heparin Dosing Weight: 92.4 kg  Vital Signs: Temp: 97.6 F (36.4 C) (02/14 0740) Temp Source: Axillary (02/14 0740) BP: 116/96 (02/14 0740) Pulse Rate: 86 (02/14 0740)  Labs:  Recent Labs  06/15/16 2250  06/16/16 0418 06/16/16 1659 06/16/16 1821 06/17/16 0814  HGB 10.7*  --  10.9*  --   --  9.9*  HCT 35.1*  --  37.2  --   --  31.7*  PLT 156  --  153  --   --  119*  APTT  --   < > 36 155* 173* 102*  HEPARINUNFRC  --   --  >2.20*  --   --  1.28*  CREATININE 1.15*  --  1.03*  --   --  1.02*  TROPONINI <0.03  --   --   --   --   --   < > = values in this interval not displayed.  Estimated Creatinine Clearance: 55.6 mL/min (by C-G formula based on SCr of 1.02 mg/dL (H)).   Assessment: 89 YOF on apixaban PTA for hx Afib transitioned to heparin this admit in the setting of AMS/somonlence with unreliable po intake.  She is now taking po medications -aPTT at goal  Goal of Therapy:  aPTT 66-102 seconds Monitor platelets by anticoagulation protocol: Yes   Plan:  -No heparin changes needed -Consider restarting apixiban -Daily heparin level and CBC  Hildred Laser, Pharm D 06/17/2016 11:13 AM

## 2016-06-18 LAB — LEGIONELLA PNEUMOPHILA SEROGP 1 UR AG: L. PNEUMOPHILA SEROGP 1 UR AG: NEGATIVE

## 2016-06-18 LAB — BASIC METABOLIC PANEL
Anion gap: 6 (ref 5–15)
BUN: 20 mg/dL (ref 6–20)
CHLORIDE: 101 mmol/L (ref 101–111)
CO2: 34 mmol/L — AB (ref 22–32)
Calcium: 8.5 mg/dL — ABNORMAL LOW (ref 8.9–10.3)
Creatinine, Ser: 1.16 mg/dL — ABNORMAL HIGH (ref 0.44–1.00)
GFR calc Af Amer: 48 mL/min — ABNORMAL LOW (ref >60)
GFR calc non Af Amer: 41 mL/min — ABNORMAL LOW (ref >60)
GLUCOSE: 312 mg/dL — AB (ref 65–99)
POTASSIUM: 3.6 mmol/L (ref 3.5–5.1)
Sodium: 141 mmol/L (ref 135–145)

## 2016-06-18 LAB — CBC
HEMATOCRIT: 30.4 % — AB (ref 36.0–46.0)
Hemoglobin: 9.2 g/dL — ABNORMAL LOW (ref 12.0–15.0)
MCH: 29.4 pg (ref 26.0–34.0)
MCHC: 30.3 g/dL (ref 30.0–36.0)
MCV: 97.1 fL (ref 78.0–100.0)
Platelets: 161 10*3/uL (ref 150–400)
RBC: 3.13 MIL/uL — ABNORMAL LOW (ref 3.87–5.11)
RDW: 14 % (ref 11.5–15.5)
WBC: 2.7 10*3/uL — ABNORMAL LOW (ref 4.0–10.5)

## 2016-06-18 LAB — URINE CULTURE: Culture: >=100000 — AB

## 2016-06-18 MED ORDER — FLUCONAZOLE 100 MG PO TABS
100.0000 mg | ORAL_TABLET | Freq: Every day | ORAL | Status: DC
  Administered 2016-06-18 – 2016-06-23 (×6): 100 mg via ORAL
  Filled 2016-06-18 (×6): qty 1

## 2016-06-18 NOTE — Care Management Note (Signed)
Case Management Note  Patient Details  Name: Elizabeth Pugh MRN: [id number] Date of Birth: 1927/02/28  Subjective/Objective:  Adm w resp failure                  Action/Plan: to go to snf for rehab before returning home   Expected Discharge Date:                  Expected Discharge Plan:  [place]  In-House Referral:  Clinical Social Work  Discharge planning Services  CM Consult  Post Acute Care Choice:    Choice offered to:     DME Arranged:    DME Agency:     HH Arranged:    Continental Agency:     Status of Service:  In process, will continue to follow  If discussed at Long Length of Stay Meetings, dates discussed:    Additional Comments:met w pt and granda in room. Da that pt lives with is recuperating from hip surg. Pt has hosp bed and trapeze at home. They would like air overlay for bed. granda also request sw consult for short term snf for rehab. sw ref placed.  Lacretia Leigh, RN 06/18/2016, 1:51 PM

## 2016-06-18 NOTE — Progress Notes (Signed)
Inpatient Diabetes Program Recommendations  AACE/ADA: New Consensus Statement on Inpatient Glycemic Control (2015)  Target Ranges:  Prepandial:   less than 140 mg/dL      Peak postprandial:   less than 180 mg/dL (1-2 hours)      Critically ill patients:  140 - 180 mg/dL   Lab Results  Component Value Date   GLUCAP 209 (H) 06/15/2016   HGBA1C 9.4 (H) 06/09/2016   Results for Elizabeth Pugh, Elizabeth Pugh (MRN [id number]) as of 06/18/2016 11:33  Ref. Range 01/20/2015 08:20 08/29/2015 16:15 06/09/2016 14:21  Hemoglobin A1C Latest Ref Range: 4.8 - 5.6 % 8.9 (H) 9.6 (H) 9.4 (H)   Results for Elizabeth Pugh, Elizabeth Pugh (MRN [id number]) as of 06/18/2016 11:33  Ref. Range 06/18/2016 01:46  Glucose Latest Ref Range: 65 - 99 mg/dL 312 (H)   Review of Glycemic Control  Diabetes history: DM, A1C high but stable over past 1.5 years, obesity  Current orders for Inpatient glycemic control: none  Inpatient Diabetes Program Recommendations:      Please consider Sensitive correction scale Novolog 0-9 units TIDAC and 0-5 units QHS.  Thank you,  Windy Carina, RN, MSN Diabetes Coordinator Inpatient Diabetes Program [phone] (Team Pager)

## 2016-06-18 NOTE — Progress Notes (Signed)
RT placed patient on BIPAP HS. Patient tolerating well at this time.  

## 2016-06-18 NOTE — Progress Notes (Addendum)
PROGRESS NOTE                                                                                                                                                                                                             Patient Demographics:    Elizabeth Pugh, is a 83 y.o. female, DOB - [birth year], [id number]  Admit date - 06/15/2016   Admitting Physician Rise Patience, MD  Outpatient Primary MD for the patient is Philis Fendt, MD  LOS - 2  Brief Narrative  83 y.o. female recently admitted for pneumonia, discharged 5 days prior to this admission was brought back to the ER after patient's family found pt lethargic and confused. Pt found to have PNA and hypercarbic respiratory failure with encephalopathy. She was placed on BiPAP and antibiotics with improvement.   Subjective:   More alert this am, denies dyspnea or chest pain, reports feeling better.    Assessment  & Plan :   Acute hypercapnic respiratory failure due acute on chronic combined CHF, perihilar pneumonia, unknown pathogen - pt with underlying sleep apnea as well in the setting of morbid obesity, OHS - initially required BiPAP and also needed it last night - better this AM with oxygen sat's in low 90's on 2 L oxygen via [place] - pt says she is oxygen dependent at baseline, 2 L - creatinine borderline normal, hold off on use of ACEi/ARB's for now - weight trend since admission, continue to monitor - continue Lasix 40 mg IV BID for now as pt still with significant edema and crackles on exam  - possible transition to oral regimen in next 24 - 48 hours   Filed Weights   06/16/16 0407 06/17/16 0500 06/18/16 0351  Weight: 135.3 kg (298 lb 4.5 oz) (!) 137.6 kg (303 lb 5.7 oz) (!) 136.8 kg (301 lb 9.4 oz)   Acute metabolic encephalopathy  - appears to be multifactorial secondary to the above - mental status improving overall - Continue to treat pneumonia,  CHF - plan to transfer to 3W unit tele   Community acquired pneumonia, perihilar, present on admission, unknown pathogen - Patient started on vancomycin and maxipime, vancomycin has been discontinued 06/16/2016 - Patient currently on Levaquin, today is a day #4 of antibiotic treatment - No plan to change regimen at this time  UTI - yeast  in urine, added fluconazole   Morbid obesity - Body mass index is 48.68 kg/m.   Acute kidney injury, hyperkalemia - In the setting of the above - potassium is now within normal limits, continue to avoid ACEI - BMP in the morning  Chronic Atrial fibrillation - [place] vasc 2 score of 8. Continue beta blocker - resumed Eliquis   Bilat buttocks skin damage, present on admission and not pressure injury  - pt with elevated BMI and is difficult to turn; required 3 person assist - high risk for skin breakdown related to constant moisture and immobility - Bilat buttocks red and moist with patchy areas of loose peeling skin and red moist partial thickness skin loss; appearance consistent with moisture associated skin damage - multiple areas of peau 'de orange skin changes, red, raised puckered areas to upper thighs, hips, buttocks; appearance consistent with chronic skin changes related to  lymphedema; weeping small amt yellow drainage. - WOC recommends Barrier cream to protect and repel moisture to bilat buttocks and sacrum. Bariatric air mattress to decrease pressure.   DM type II. Uncontrolled - Currently on sliding scale monitor.  Lab Results  Component Value Date   HGBA1C 9.4 (H) 06/09/2016   Pancytopenia - Partially reactive in the setting of acute illness, also please note that patient is on blood thinner - Currently no signs of active bleeding - Monitor CBC while inpatient  Diet : DIET SOFT Room service appropriate? Yes; Fluid consistency: Thin    Family Communication  :  No family at bedside this AM   Code Status :  No  Intubation  Disposition Plan  :  SNF in 2-3 days, PT/OT eval requested   Consults  :  None  Procedures  :  None  DVT Prophylaxis  :  Heparin gtt / Eliquis  Lab Results  Component Value Date   PLT 161 06/18/2016   Inpatient Medications  Scheduled Meds: . apixaban  5 mg Oral BID  . chlorhexidine  15 mL Mouth Rinse BID  . furosemide  40 mg Intravenous BID  . levETIRAcetam  500 mg Oral BID  . levofloxacin  500 mg Oral Daily  . mouth rinse  15 mL Mouth Rinse q12n4p  . metoprolol tartrate  12.5 mg Oral BID   Continuous Infusions:  PRN Meds:.acetaminophen **OR** acetaminophen, ipratropium-albuterol, ondansetron **OR** ondansetron (ZOFRAN) IV  Antibiotics  :    Anti-infectives    Start     Dose/Rate Route Frequency Ordered Stop   06/16/16 1200  levofloxacin (LEVAQUIN) tablet 500 mg     500 mg Oral Daily 06/16/16 0955 06/20/16 0959   06/16/16 0800  ceFEPIme (MAXIPIME) 1 g in dextrose 5 % 50 mL IVPB  Status:  Discontinued     1 g 100 mL/hr over 30 Minutes Intravenous Every 8 hours 06/16/16 0322 06/17/16 1158   06/16/16 0500  vancomycin (VANCOCIN) 1,750 mg in sodium chloride 0.9 % 500 mL IVPB  Status:  Discontinued     1,750 mg 250 mL/hr over 120 Minutes Intravenous Every 24 hours 06/16/16 0448 06/16/16 0955   06/16/16 0345  vancomycin (VANCOCIN) IVPB 1000 mg/200 mL premix  Status:  Discontinued     1,000 mg 200 mL/hr over 60 Minutes Intravenous  Once 06/16/16 0342 06/16/16 0447   06/16/16 0015  vancomycin (VANCOCIN) IVPB 1000 mg/200 mL premix     1,000 mg 200 mL/hr over 60 Minutes Intravenous  Once 06/16/16 0005 06/16/16 0257   06/16/16 0015  ceFEPIme (MAXIPIME) 2 g in  dextrose 5 % 50 mL IVPB     2 g 100 mL/hr over 30 Minutes Intravenous  Once 06/16/16 0005 06/16/16 0118      Objective:   Vitals:   06/18/16 1000 06/18/16 1100 06/18/16 1200 06/18/16 1202  BP: 134/80 (!) 143/82  132/89  Pulse: 97 (!) 108    Resp: 19 (!) 22 19 (!) 23  Temp:   98.1 F (36.7 C) 98.1 F  (36.7 C)  TempSrc:   Oral Oral  SpO2: 99% 97%  96%  Weight:      Height:        Wt Readings from Last 3 Encounters:  06/18/16 (!) 136.8 kg (301 lb 9.4 oz)  12/17/15 (!) 143.3 kg (316 lb)  10/14/15 127.5 kg (281 lb)     Intake/Output Summary (Last 24 hours) at 06/18/16 1335 Last data filed at 06/18/16 1137  Gross per 24 hour  Intake              630 ml  Output             1900 ml  Net            -1270 ml   Physical Exam Constitutional: Appears well-developed. No distress.  CVS: RRR, S1/S2 +, no murmurs, no gallops, no carotid bruit, LE edema +2  Pulmonary: diminished breath sounds at bases, mild crackles noted  Abdominal: Soft. BS +,  no distension, tenderness, rebound or guarding.  Musculoskeletal: Normal range of motion.    Data Review:   CBC  Recent Labs Lab 06/15/16 2250 06/16/16 0418 06/17/16 0814 06/18/16 0146  WBC 3.8* 3.1* 3.0* 2.7*  HGB 10.7* 10.9* 9.9* 9.2*  HCT 35.1* 37.2 31.7* 30.4*  PLT 156 153 119* 161  MCV 99.4 100.3* 96.4 97.1  MCH 30.3 29.4 30.1 29.4  MCHC 30.5 29.3* 31.2 30.3  RDW 13.9 14.0 14.2 14.0  LYMPHSABS 1.0 1.2  --   --   MONOABS 0.3 0.3  --   --   EOSABS 0.1 0.0  --   --   BASOSABS 0.0 0.0  --   --     Chemistries   Recent Labs Lab 06/15/16 2250 06/16/16 0418 06/17/16 0814 06/18/16 0146  NA 140 139 141 141  K 5.3* 5.7* 4.1 3.6  CL 102 103 103 101  CO2 33* 29 29 34*  GLUCOSE 226* 185* 163* 312*  BUN 22* 22* 20 20  CREATININE 1.15* 1.03* 1.02* 1.16*  CALCIUM 9.6 9.3 8.7* 8.5*  AST 11* 16  --   --   ALT 10* 10*  --   --   ALKPHOS 45 43  --   --   BILITOT 0.9 0.9  --   --    Lab Results  Component Value Date   HGBA1C 9.4 (H) 06/09/2016   Cardiac Enzymes  Recent Labs Lab 06/15/16 2250  TROPONINI <0.03   ------------------------------------------------------------------------------------------------------------------    Component Value Date/Time   BNP 233.2 (H) 06/15/2016 2250   Micro Results Recent Results  (from the past 240 hour(s))  Blood culture (routine x 2)     Status: None   Collection Time: 06/09/16  2:22 PM  Result Value Ref Range Status   Specimen Description BLOOD LEFT ARM  Final   Special Requests BOTTLES DRAWN AEROBIC AND ANAEROBIC 5 CC EA  Final   Culture   Final    NO GROWTH 5 DAYS Performed at [facility], [address], [place], [place] [address]  Report Status 06/14/2016 FINAL  Final  Blood culture (routine x 2)     Status: None   Collection Time: 06/09/16  2:22 PM  Result Value Ref Range Status   Specimen Description BLOOD RIGHT ARM  Final   Special Requests IN PEDIATRIC BOTTLE  Final   Culture   Final    NO GROWTH 5 DAYS Performed at [facility], [place] [address], [place], [place] [address]    Report Status 06/14/2016 FINAL  Final  Culture, blood (routine x 2) Call MD if unable to obtain prior to antibiotics being given     Status: None   Collection Time: 06/09/16  8:13 PM  Result Value Ref Range Status   Specimen Description BLOOD LEFT HAND  Final   Special Requests BOTTLES DRAWN AEROBIC ONLY 5CC  Final   Culture   Final    NO GROWTH 5 DAYS Performed at [facility], [place] [address] [place], [place] [address]    Report Status 06/14/2016 FINAL  Final  Respiratory Panel by PCR     Status: None   Collection Time: 06/09/16  9:54 PM  Result Value Ref Range Status   Adenovirus NOT DETECTED NOT DETECTED Final   Coronavirus 229E NOT DETECTED NOT DETECTED Final   Coronavirus HKU1 NOT DETECTED NOT DETECTED Final   Coronavirus NL63 NOT DETECTED NOT DETECTED Final   Coronavirus OC43 NOT DETECTED NOT DETECTED Final   Metapneumovirus NOT DETECTED NOT DETECTED Final   Rhinovirus / Enterovirus NOT DETECTED NOT DETECTED Final   Influenza A NOT DETECTED NOT DETECTED Final   Influenza B NOT DETECTED NOT DETECTED Final   Parainfluenza Virus 1 NOT DETECTED NOT DETECTED Final   Parainfluenza Virus 2 NOT DETECTED NOT DETECTED Final   Parainfluenza Virus 3  NOT DETECTED NOT DETECTED Final   Parainfluenza Virus 4 NOT DETECTED NOT DETECTED Final   Respiratory Syncytial Virus NOT DETECTED NOT DETECTED Final   Bordetella pertussis NOT DETECTED NOT DETECTED Final   Chlamydophila pneumoniae NOT DETECTED NOT DETECTED Final   Mycoplasma pneumoniae NOT DETECTED NOT DETECTED Final    Comment: Performed at [facility], [place] [address], [place], [place] [address]  Culture, sputum-assessment     Status: None   Collection Time: 06/10/16  8:59 AM  Result Value Ref Range Status   Specimen Description SPUTUM  Final   Special Requests NONE  Final   Sputum evaluation THIS SPECIMEN IS ACCEPTABLE FOR SPUTUM CULTURE  Final   Report Status 06/10/2016 FINAL  Final  Culture, respiratory (NON-Expectorated)     Status: None   Collection Time: 06/10/16  8:59 AM  Result Value Ref Range Status   Specimen Description SPUTUM  Final   Special Requests NONE Reflexed from [id number]  Final   Gram Stain   Final    FEW WBC PRESENT,BOTH PMN AND MONONUCLEAR RARE GRAM POSITIVE COCCI RARE GRAM NEGATIVE RODS    Culture   Final    Consistent with normal respiratory flora. Performed at [facility], [place] [address] [place], [place] [address]    Report Status 06/12/2016 FINAL  Final  Blood Culture (routine x 2)     Status: None (Preliminary result)   Collection Time: 06/15/16 10:50 PM  Result Value Ref Range Status   Specimen Description BLOOD RIGHT HAND  Final   Special Requests IN PEDIATRIC BOTTLE 4ML  Final   Culture NO GROWTH 1 DAY  Final   Report Status PENDING  Incomplete  Blood Culture (routine x 2)     Status: None (Preliminary result)   Collection Time: 06/15/16 11:01 PM  Result Value Ref Range Status   Specimen Description BLOOD LEFT ARM  Final   Special Requests BOTTLES DRAWN AEROBIC AND ANAEROBIC 10ML  Final   Culture NO GROWTH 1 DAY  Final   Report Status PENDING  Incomplete  Urine culture     Status: Abnormal   Collection Time: 06/15/16 11:56 PM   Result Value Ref Range Status   Specimen Description URINE, RANDOM  Final   Special Requests NONE  Final   Culture >=100,000 COLONIES/mL YEAST (A)  Final   Report Status 06/18/2016 FINAL  Final  MRSA PCR Screening     Status: None   Collection Time: 06/16/16  4:12 AM  Result Value Ref Range Status   MRSA by PCR NEGATIVE NEGATIVE Final    Comment:        The GeneXpert MRSA Assay (FDA approved for NASAL specimens only), is one component of a comprehensive MRSA colonization surveillance program. It is not intended to diagnose MRSA infection nor to guide or monitor treatment for MRSA infections.     Radiology Reports Dg Chest 2 View  Result Date: 06/09/2016 CLINICAL DATA:  Fever and cough EXAM: CHEST  2 VIEW COMPARISON:  December 11, 2015 FINDINGS: There is consolidation in the posterior left base region. Lungs elsewhere clear. Heart is mildly enlarged. The pulmonary vascularity has a pattern suggesting a degree of pulmonary arterial hypertension. No adenopathy evident. No bone lesions. IMPRESSION: Posterior left base consolidation consistent with pneumonia. Mild cardiomegaly. Suspect a degree of pulmonary arterial hypertension. Followup PA and lateral chest radiographs recommended in 3-4 weeks following trial of antibiotic therapy to ensure resolution and exclude underlying malignancy. Electronically Signed   By: Lowella Grip III M.D.   On: 06/09/2016 15:09   Ct Head Wo Contrast  Result Date: 06/16/2016 CLINICAL DATA:  Altered mental status EXAM: CT HEAD WITHOUT CONTRAST TECHNIQUE: Contiguous axial images were obtained from the base of the skull through the vertex without intravenous contrast. COMPARISON:  12/11/2015 CT FINDINGS: Brain: Mild sulcal prominence bilaterally consistent superficial atrophy. Minimal small vessel ischemic disease of periventricular white matter. No large vascular territory infarction, hemorrhage or midline shift. Left basal ganglial lacunar infarcts appear  chronic. Fourth ventricle is midline. No extra-axial fluid collections. Vascular: Moderate atherosclerosis of the carotid siphons. No hyperdense vessels. Skull: Normal. Negative for fracture or focal lesion. Osteopenic appearance of the bony calvarium. Sinuses/Orbits: Inferior mastoid air cells are partially opacified consistent with a mastoid effusion. Minimal ethmoid sinus mucosal thickening. The visualized sphenoid and maxillary sinuses as well as the frontal sinus appear clear. Other: None IMPRESSION: No acute intracranial abnormality. Chronic small vessel ischemic disease and left-sided basal ganglial lacunar infarcts. Chronic small mastoid effusion on the right. Moderate atherosclerosis of carotid siphons Electronically Signed   By: Ashley Royalty M.D.   On: 06/16/2016 00:25   Dg Chest Port 1 View  Result Date: 06/15/2016 CLINICAL DATA:  Lethargic tonight. Discharge from hospital for days ago with urinary tract infection and pneumonia. EXAM: PORTABLE CHEST 1 VIEW COMPARISON:  06/09/2016 FINDINGS: Shallow inspiration. Cardiac enlargement with mild pulmonary vascular congestion. Bilateral perihilar infiltrates are progressing since previous study and may indicate edema or progressing pneumonia. Small bilateral pleural effusions. Calcified and tortuous aorta. No pneumothorax. IMPRESSION: Progression of perihilar infiltrates may represent developing edema or progressing pneumonia. Small pleural effusions. Electronically Signed   By: Oren Beckmann.D.  On: 06/15/2016 23:19   Time Spent in minutes  30  MAGICK-MYERS, ISKRA M.D on 06/18/2016 at 1:35 PM  Between 7am to 7pm - Pager - [phone]  After 7pm go to [email or web address] - password [facility]  [facility] -  Office  [phone]

## 2016-06-18 NOTE — Evaluation (Signed)
Occupational Therapy Evaluation Patient Details Name: Elizabeth Pugh MRN: [id number] DOB: Jun 06, 1926 Today's Date: 06/18/2016    History of Present Illness Pt admitted with PNA and hypercarbic respiratory failure with encephalopathy requiring bipap. Pt with recent admission for PNA. PMH: CVA, DM, CHF.   Clinical Impression   Pt is bed bound and dependent in all ADL and mobility with the exception of feeding and grooming. Pt presents with generalized weakness and obesity. She requires 2 person assist for rolling. Pt's family would like her to have better UE strength so she may help them with bed mobility at home. Will follow to provide UE exercise program and instruct pt and family.    Follow Up Recommendations  No OT follow up;Supervision/Assistance - 24 hour    Equipment Recommendations  None recommended by OT    Recommendations for Other Services       Precautions / Restrictions Restrictions Weight Bearing Restrictions: No      Mobility Bed Mobility Overal bed mobility: Needs Assistance Bed Mobility: Rolling Rolling: +2 for physical assistance;Max assist         General bed mobility comments: pt pulling with rail  Transfers                 General transfer comment: pt requires lift equipment    Balance                                            ADL Overall ADL's : At baseline                                       General ADL Comments: Pt can self feed and perform some grooming at bed level.     Vision     Perception     Praxis      Pertinent Vitals/Pain Pain Assessment: No/denies pain     Hand Dominance Right   Extremity/Trunk Assessment Upper Extremity Assessment Upper Extremity Assessment: RUE deficits/detail;LUE deficits/detail RUE Deficits / Details: 4-/5 shoulder, 4/5 biceps, 3+/5 triceps, 4/5 gross grasp LUE Deficits / Details: 3+/5 shoulder, 4/5 biceps, 3+/5 triceps, 4/5 gross grasp   Lower  Extremity Assessment Lower Extremity Assessment: Defer to PT evaluation       Communication Communication Communication: No difficulties   Cognition Arousal/Alertness: Awake/alert Behavior During Therapy: WFL for tasks assessed/performed Overall Cognitive Status: Within Functional Limits for tasks assessed                     General Comments       Exercises       Shoulder Instructions      Home Living Family/patient expects to be discharged to:: Private residence Living Arrangements: Children (daughter) Available Help at Discharge: Family;Available 24 hours/day;Personal care attendant Type of Home: House Home Access: Ramped entrance     Home Layout: One level               Home Equipment: Hospital bed;Transport chair;Electric scooter;Bedside commode;Other (comment);Wheelchair - power (hoyer lift, trapeze, transport chair, lift chair)          Prior Functioning/Environment Level of Independence: Needs assistance  Gait / Transfers Assistance Needed: bed bound except when grandson present and can use hoyer lift for OOB. Has been non-ambulatory x 2 yrs per  pt ADL's / Homemaking Assistance Needed: Total assist for bathing, dressing, toileting (per pt, she wears a diaper and daughter changes her) has a CNA             OT Problem List: Decreased strength;Obesity;Decreased activity tolerance;Impaired UE functional use   OT Treatment/Interventions:      OT Goals(Current goals can be found in the care plan section) Acute Rehab OT Goals Patient Stated Goal: family would like pt to be able to assist more with rolling for care OT Goal Formulation: With patient Potential to Achieve Goals: Good ADL Goals Pt/caregiver will Perform Home Exercise Program: Increased strength;Both right and left upper extremity;With theraband;Independently (pt and family)  OT Frequency:     Barriers to D/C:            Co-evaluation PT/OT/SLP Co-Evaluation/Treatment:  Yes Reason for Co-Treatment: For patient/therapist safety   OT goals addressed during session: Strengthening/ROM      End of Session    Activity Tolerance: Patient tolerated treatment well Patient left: in bed;with call bell/phone within reach   Time: [id number] OT Time Calculation (min): 30 min Charges:  OT General Charges $OT Visit: 1 Procedure OT Evaluation $OT Eval Moderate Complexity: 1 Procedure G-Codes:    Malka So 06/18/2016, 3:11 PM  [phone]

## 2016-06-18 NOTE — Evaluation (Signed)
Physical Therapy Evaluation Patient Details Name: Elizabeth Pugh MRN: [id number] DOB: 08-13-1926 Today's Date: 06/18/2016   History of Present Illness  Pt admitted with PNA and hypercarbic respiratory failure with encephalopathy requiring bipap. Pt with recent admission for PNA. PMH: CVA, DM, CHF.  Clinical Impression  Pt presented supine in bed with HOB elevated, awake and willing to participate in therapy session. Pt's granddaughter present in room as well and stated that pt has been bed bound and totally dependent for approximately four years. Pt currently requires max A x2 for rolling in bed. No further acute needs identified at this time as pt is at her baseline level of function and family has all necessary equipment at home. PT signing off.     Follow Up Recommendations Supervision/Assistance - 24 hour    Equipment Recommendations  None recommended by PT    Recommendations for Other Services       Precautions / Restrictions Precautions Precautions: Fall Restrictions Weight Bearing Restrictions: No      Mobility  Bed Mobility Overal bed mobility: Needs Assistance Bed Mobility: Rolling Rolling: +2 for physical assistance;Max assist         General bed mobility comments: pt using bilateral UEs on bed rails, max A x2 at bilateral LEs and hips  Transfers                 General transfer comment: pt is bed-bound at baseline and requires lift equipment for transfers at home  Ambulation/Gait                Stairs            Wheelchair Mobility    Modified Rankin (Stroke Patients Only)       Balance                                             Pertinent Vitals/Pain Pain Assessment: No/denies pain    Home Living Family/patient expects to be discharged to:: Private residence Living Arrangements: Children (daughter) Available Help at Discharge: Family;Available 24 hours/day;Personal care attendant Type of Home:  House Home Access: Ramped entrance     Home Layout: One level Home Equipment: Hospital bed;Transport chair;Electric scooter;Bedside commode;Other (comment);Wheelchair - power Optician, dispensing, trapeze, transport chair, lift chair)      Prior Function Level of Independence: Needs assistance   Gait / Transfers Assistance Needed: bed bound except when grandson present and can use hoyer lift for OOB. Has been non-ambulatory x 2 yrs per pt  ADL's / Homemaking Assistance Needed: Total assist for bathing, dressing, toileting (per pt, she wears a diaper and daughter changes her) has a CNA         Hand Dominance   Dominant Hand: Right    Extremity/Trunk Assessment   Upper Extremity Assessment Upper Extremity Assessment: Defer to OT evaluation RUE Deficits / Details: 4-/5 shoulder, 4/5 biceps, 3+/5 triceps, 4/5 gross grasp LUE Deficits / Details: 3+/5 shoulder, 4/5 biceps, 3+/5 triceps, 4/5 gross grasp    Lower Extremity Assessment Lower Extremity Assessment: Generalized weakness;RLE deficits/detail;LLE deficits/detail RLE Deficits / Details: pitting edema noted; pt able to lift lower leg against gravity through partial ROM during bed mobility LLE Deficits / Details: pitting edema noted. Pt unable to actively move LE during bed mobility       Communication   Communication: No difficulties  Cognition Arousal/Alertness: Awake/alert Behavior  During Therapy: WFL for tasks assessed/performed Overall Cognitive Status: Within Functional Limits for tasks assessed                      General Comments      Exercises     Assessment/Plan    PT Assessment Patent does not need any further PT services  PT Problem List            PT Treatment Interventions      PT Goals (Current goals can be found in the Care Plan section)  Acute Rehab PT Goals Patient Stated Goal: family would like pt to be able to assist more with rolling for care    Frequency     Barriers to discharge         Co-evaluation PT/OT/SLP Co-Evaluation/Treatment: Yes Reason for Co-Treatment: For patient/therapist safety PT goals addressed during session: Mobility/safety with mobility OT goals addressed during session: Strengthening/ROM       End of Session Equipment Utilized During Treatment: Oxygen Activity Tolerance: Patient limited by fatigue Patient left: in bed;with call bell/phone within reach Nurse Communication: Mobility status;Need for lift equipment         Time: 1425-1455 PT Time Calculation (min) (ACUTE ONLY): 30 min   Charges:   PT Evaluation $PT Eval Moderate Complexity: 1 Procedure     PT G CodesClearnce Sorrel Casimir Barcellos 06/18/2016, 3:45 PM Sherie Don, [place], DPT [phone]

## 2016-06-19 LAB — CBC
HEMATOCRIT: 32.9 % — AB (ref 36.0–46.0)
Hemoglobin: 10 g/dL — ABNORMAL LOW (ref 12.0–15.0)
MCH: 29.3 pg (ref 26.0–34.0)
MCHC: 30.4 g/dL (ref 30.0–36.0)
MCV: 96.5 fL (ref 78.0–100.0)
PLATELETS: 157 10*3/uL (ref 150–400)
RBC: 3.41 MIL/uL — ABNORMAL LOW (ref 3.87–5.11)
RDW: 14.3 % (ref 11.5–15.5)
WBC: 2.9 10*3/uL — AB (ref 4.0–10.5)

## 2016-06-19 LAB — BASIC METABOLIC PANEL
ANION GAP: 9 (ref 5–15)
BUN: 16 mg/dL (ref 6–20)
CALCIUM: 9.1 mg/dL (ref 8.9–10.3)
CO2: 34 mmol/L — AB (ref 22–32)
Chloride: 97 mmol/L — ABNORMAL LOW (ref 101–111)
Creatinine, Ser: 1.15 mg/dL — ABNORMAL HIGH (ref 0.44–1.00)
GFR calc Af Amer: 48 mL/min — ABNORMAL LOW (ref >60)
GFR, EST NON AFRICAN AMERICAN: 42 mL/min — AB (ref >60)
GLUCOSE: 278 mg/dL — AB (ref 65–99)
Potassium: 3.6 mmol/L (ref 3.5–5.1)
Sodium: 140 mmol/L (ref 135–145)

## 2016-06-19 LAB — GLUCOSE, CAPILLARY
Glucose-Capillary: 338 mg/dL — ABNORMAL HIGH (ref 65–99)
Glucose-Capillary: 335 mg/dL — ABNORMAL HIGH (ref 65–99)

## 2016-06-19 MED ORDER — INSULIN ASPART 100 UNIT/ML ~~LOC~~ SOLN
0.0000 [IU] | Freq: Every day | SUBCUTANEOUS | Status: DC
  Administered 2016-06-19: 4 [IU] via SUBCUTANEOUS
  Administered 2016-06-20 – 2016-06-21 (×2): 2 [IU] via SUBCUTANEOUS
  Administered 2016-06-22: 4 [IU] via SUBCUTANEOUS

## 2016-06-19 MED ORDER — INSULIN ASPART 100 UNIT/ML ~~LOC~~ SOLN
0.0000 [IU] | Freq: Three times a day (TID) | SUBCUTANEOUS | Status: DC
Start: 2016-06-19 — End: 2016-06-23
  Administered 2016-06-19: 7 [IU] via SUBCUTANEOUS
  Administered 2016-06-20 (×2): 3 [IU] via SUBCUTANEOUS
  Administered 2016-06-20 – 2016-06-21 (×2): 5 [IU] via SUBCUTANEOUS
  Administered 2016-06-21 (×2): 3 [IU] via SUBCUTANEOUS
  Administered 2016-06-22: 5 [IU] via SUBCUTANEOUS
  Administered 2016-06-22: 3 [IU] via SUBCUTANEOUS
  Administered 2016-06-22: 5 [IU] via SUBCUTANEOUS
  Administered 2016-06-23: 2 [IU] via SUBCUTANEOUS
  Administered 2016-06-23 (×2): 5 [IU] via SUBCUTANEOUS

## 2016-06-19 MED ORDER — PHENAZOPYRIDINE HCL 100 MG PO TABS
100.0000 mg | ORAL_TABLET | Freq: Three times a day (TID) | ORAL | Status: AC
Start: 2016-06-19 — End: 2016-06-21
  Administered 2016-06-19 – 2016-06-21 (×5): 100 mg via ORAL
  Filled 2016-06-19 (×6): qty 1

## 2016-06-19 MED ORDER — ROPINIROLE HCL 1 MG PO TABS
1.0000 mg | ORAL_TABLET | Freq: Once | ORAL | Status: AC
Start: 2016-06-19 — End: 2016-06-19
  Administered 2016-06-19: 1 mg via ORAL
  Filled 2016-06-19: qty 1

## 2016-06-19 MED ORDER — ROPINIROLE HCL 1 MG PO TABS
2.0000 mg | ORAL_TABLET | Freq: Three times a day (TID) | ORAL | Status: DC
  Administered 2016-06-19 – 2016-06-23 (×13): 2 mg via ORAL
  Filled 2016-06-19 (×6): qty 2
  Filled 2016-06-19: qty 4
  Filled 2016-06-19 (×7): qty 2

## 2016-06-19 MED ORDER — INSULIN GLARGINE 100 UNIT/ML ~~LOC~~ SOLN
15.0000 [IU] | Freq: Every day | SUBCUTANEOUS | Status: DC
  Administered 2016-06-19 – 2016-06-21 (×3): 15 [IU] via SUBCUTANEOUS
  Filled 2016-06-19 (×4): qty 0.15

## 2016-06-19 NOTE — Progress Notes (Addendum)
Inpatient Diabetes Program Recommendations  AACE/ADA: New Consensus Statement on Inpatient Glycemic Control (2015)  Target Ranges:  Prepandial:   less than 140 mg/dL      Peak postprandial:   less than 180 mg/dL (1-2 hours)      Critically ill patients:  140 - 180 mg/dL   Lab Results  Component Value Date   GLUCAP 209 (H) 06/15/2016   HGBA1C 9.4 (H) 06/09/2016   Results for AMARI, HSUEH (MRN [id number]) as of 06/19/2016 10:40  Ref. Range 06/18/2016 01:46 06/19/2016 02:54  Glucose Latest Ref Range: 65 - 99 mg/dL 312 (H) 278 (H)   Review of Glycemic Control  Diabetes history: DM, A1C high but stable over past 1.5 years, obesity  Current orders for Inpatient glycemic control: none  At home, patient takes Lantus 30 units QHS and Novolog 0-12 units prn  Inpatient Diabetes Program Recommendations:      Please consider sensitive correction scale Novolog 0-9 units TIDAC and 0-5 units QHS.    Agree with Pharmacy to please consider Lantus 15 units daily starting now.   Thank you,  Windy Carina, RN, MSN Diabetes Coordinator Inpatient Diabetes Program [phone] (Team Pager)

## 2016-06-19 NOTE — Progress Notes (Signed)
PROGRESS NOTE                                                                                                                                                                                                             Patient Demographics:    Elizabeth Pugh, is a 83 y.o. female, DOB - 02-Jul-1926, [id number]  Admit date - 06/15/2016   Admitting Physician Rise Patience, MD  Outpatient Primary MD for the patient is Philis Fendt, MD  LOS - 3  Brief Narrative  83 y.o. female recently admitted for pneumonia, discharged 5 days prior to this admission was brought back to the ER after patient's family found pt lethargic and confused. Pt found to have PNA and hypercarbic respiratory failure with encephalopathy. She was placed on BiPAP and antibiotics with improvement.   Subjective:   More alert this am, denies dyspnea or chest pain, reports feeling better.    Assessment  & Plan :   Acute hypercapnic respiratory failure due acute on chronic combined CHF, perihilar pneumonia, unknown pathogen - pt with underlying sleep apnea as well in the setting of morbid obesity, OHS - initially required BiPAP and also needed it last night - better this AM with oxygen sat's in low 90's on 2 L oxygen via [place] - pt says she is oxygen dependent at baseline, 2 L - creatinine borderline normal, hold off on use of ACEi/ARB's for now - weight trend since admission, continue to monitor - continue Lasix 40 mg IV BID for now as pt still with significant edema and crackles on exam, overall better   Filed Weights   06/17/16 0500 06/18/16 0351 06/19/16 0502  Weight: (!) 137.6 kg (303 lb 5.7 oz) (!) 136.8 kg (301 lb 9.4 oz) 135.6 kg (298 lb 14.4 oz)   Acute metabolic encephalopathy  - appears to be multifactorial secondary to the above - mental status improving overall - Continue to treat pneumonia, CHF  Community acquired pneumonia,  perihilar, present on admission, unknown pathogen - Patient started on vancomycin and maxipime, vancomycin has been discontinued 06/16/2016 - Patient currently on Levaquin, today is a day #4 of antibiotic treatment - No plan to change regimen at this time  UTI - yeast in urine, added fluconazole, today is day #2  Morbid obesity - Body mass index is 48.24 kg/m.  Acute kidney injury, hyperkalemia - In the setting of the above - potassium is now within normal limits, continue to avoid ACEI - BMP in the morning  Chronic Atrial fibrillation - [place] vasc 2 score of 8. Continue beta blocker - resumed Eliquis   Bilat buttocks skin damage, present on admission and not pressure injury  - pt with elevated BMI and is difficult to turn; required 3 person assist - high risk for skin breakdown related to constant moisture and immobility - Bilat buttocks red and moist with patchy areas of loose peeling skin and red moist partial thickness skin loss; appearance consistent with moisture associated skin damage - multiple areas of peau 'de orange skin changes, red, raised puckered areas to upper thighs, hips, buttocks; appearance consistent with chronic skin changes related to  lymphedema; weeping small amt yellow drainage. - WOC recommends Barrier cream to protect and repel moisture to bilat buttocks and sacrum. Bariatric air mattress to decrease pressure.   DM type II. Uncontrolled - Currently on sliding scale, added Lantus 15 U daily   Lab Results  Component Value Date   HGBA1C 9.4 (H) 06/09/2016   Pancytopenia - Partially reactive in the setting of acute illness, also please note that patient is on blood thinner - Currently no signs of active bleeding - Monitor CBC while inpatient  Diet : DIET SOFT Room service appropriate? Yes; Fluid consistency: Thin    Family Communication  :  No family at bedside this AM   Code Status :  No Intubation  Disposition Plan  :  SNF in 2-3 days, PT/OT  eval requested   Consults  :  None  Procedures  :  None  DVT Prophylaxis  :  Eliquis  Lab Results  Component Value Date   PLT 157 06/19/2016   Inpatient Medications  Scheduled Meds: . apixaban  5 mg Oral BID  . chlorhexidine  15 mL Mouth Rinse BID  . fluconazole  100 mg Oral Daily  . furosemide  40 mg Intravenous BID  . levETIRAcetam  500 mg Oral BID  . mouth rinse  15 mL Mouth Rinse q12n4p  . metoprolol tartrate  12.5 mg Oral BID  . phenazopyridine  100 mg Oral TID WC  . rOPINIRole  2 mg Oral TID   Continuous Infusions:  PRN Meds:.acetaminophen **OR** acetaminophen, ipratropium-albuterol, ondansetron **OR** ondansetron (ZOFRAN) IV  Antibiotics  :    Anti-infectives    Start     Dose/Rate Route Frequency Ordered Stop   06/18/16 1500  fluconazole (DIFLUCAN) tablet 100 mg     100 mg Oral Daily 06/18/16 1416     06/16/16 1200  levofloxacin (LEVAQUIN) tablet 500 mg     500 mg Oral Daily 06/16/16 0955 06/19/16 0823   06/16/16 0800  ceFEPIme (MAXIPIME) 1 g in dextrose 5 % 50 mL IVPB  Status:  Discontinued     1 g 100 mL/hr over 30 Minutes Intravenous Every 8 hours 06/16/16 0322 06/17/16 1158   06/16/16 0500  vancomycin (VANCOCIN) 1,750 mg in sodium chloride 0.9 % 500 mL IVPB  Status:  Discontinued     1,750 mg 250 mL/hr over 120 Minutes Intravenous Every 24 hours 06/16/16 0448 06/16/16 0955   06/16/16 0345  vancomycin (VANCOCIN) IVPB 1000 mg/200 mL premix  Status:  Discontinued     1,000 mg 200 mL/hr over 60 Minutes Intravenous  Once 06/16/16 0342 06/16/16 0447   06/16/16 0015  vancomycin (VANCOCIN) IVPB 1000 mg/200 mL premix  1,000 mg 200 mL/hr over 60 Minutes Intravenous  Once 06/16/16 0005 06/16/16 0257   06/16/16 0015  ceFEPIme (MAXIPIME) 2 g in dextrose 5 % 50 mL IVPB     2 g 100 mL/hr over 30 Minutes Intravenous  Once 06/16/16 0005 06/16/16 0118      Objective:   Vitals:   06/18/16 2017 06/18/16 2253 06/19/16 0502 06/19/16 1442  BP: 113/67  118/76 129/81    Pulse: (!) 101 (!) 101 88 94  Resp: (!) 25 (!) 22 18 (!) 24  Temp: 98.7 F (37.1 C)  97.7 F (36.5 C) 99.8 F (37.7 C)  TempSrc: Oral  Axillary Oral  SpO2: 96% 99% 98% 97%  Weight:   135.6 kg (298 lb 14.4 oz)   Height:        Wt Readings from Last 3 Encounters:  06/19/16 135.6 kg (298 lb 14.4 oz)  12/17/15 (!) 143.3 kg (316 lb)  10/14/15 127.5 kg (281 lb)     Intake/Output Summary (Last 24 hours) at 06/19/16 1629 Last data filed at 06/19/16 0510  Gross per 24 hour  Intake               75 ml  Output             1300 ml  Net            -1225 ml   Physical Exam Constitutional: Appears well-developed. No distress.  CVS: RRR, S1/S2 +, no murmurs, no gallops, no carotid bruit, LE edema +2  Pulmonary: diminished breath sounds at bases, mild crackles noted  Abdominal: Soft. BS +,  no distension, tenderness, rebound or guarding.  Musculoskeletal: Normal range of motion.    Data Review:   CBC  Recent Labs Lab 06/15/16 2250 06/16/16 0418 06/17/16 0814 06/18/16 0146 06/19/16 0254  WBC 3.8* 3.1* 3.0* 2.7* 2.9*  HGB 10.7* 10.9* 9.9* 9.2* 10.0*  HCT 35.1* 37.2 31.7* 30.4* 32.9*  PLT 156 153 119* 161 157  MCV 99.4 100.3* 96.4 97.1 96.5  MCH 30.3 29.4 30.1 29.4 29.3  MCHC 30.5 29.3* 31.2 30.3 30.4  RDW 13.9 14.0 14.2 14.0 14.3  LYMPHSABS 1.0 1.2  --   --   --   MONOABS 0.3 0.3  --   --   --   EOSABS 0.1 0.0  --   --   --   BASOSABS 0.0 0.0  --   --   --     Chemistries   Recent Labs Lab 06/15/16 2250 06/16/16 0418 06/17/16 0814 06/18/16 0146 06/19/16 0254  NA 140 139 141 141 140  K 5.3* 5.7* 4.1 3.6 3.6  CL 102 103 103 101 97*  CO2 33* 29 29 34* 34*  GLUCOSE 226* 185* 163* 312* 278*  BUN 22* 22* 20 20 16   CREATININE 1.15* 1.03* 1.02* 1.16* 1.15*  CALCIUM 9.6 9.3 8.7* 8.5* 9.1  AST 11* 16  --   --   --   ALT 10* 10*  --   --   --   ALKPHOS 45 43  --   --   --   BILITOT 0.9 0.9  --   --   --    Lab Results  Component Value Date   HGBA1C 9.4 (H)  06/09/2016   Cardiac Enzymes  Recent Labs Lab 06/15/16 2250  TROPONINI <0.03   ------------------------------------------------------------------------------------------------------------------    Component Value Date/Time   BNP 233.2 (H) 06/15/2016 2250   Micro Results Recent Results (from the past  240 hour(s))  Culture, blood (routine x 2) Call MD if unable to obtain prior to antibiotics being given     Status: None   Collection Time: 06/09/16  8:13 PM  Result Value Ref Range Status   Specimen Description BLOOD LEFT HAND  Final   Special Requests BOTTLES DRAWN AEROBIC ONLY 5CC  Final   Culture   Final    NO GROWTH 5 DAYS Performed at [facility], [place] [address] [place], [place] [address]    Report Status 06/14/2016 FINAL  Final  Respiratory Panel by PCR     Status: None   Collection Time: 06/09/16  9:54 PM  Result Value Ref Range Status   Adenovirus NOT DETECTED NOT DETECTED Final   Coronavirus 229E NOT DETECTED NOT DETECTED Final   Coronavirus HKU1 NOT DETECTED NOT DETECTED Final   Coronavirus NL63 NOT DETECTED NOT DETECTED Final   Coronavirus OC43 NOT DETECTED NOT DETECTED Final   Metapneumovirus NOT DETECTED NOT DETECTED Final   Rhinovirus / Enterovirus NOT DETECTED NOT DETECTED Final   Influenza A NOT DETECTED NOT DETECTED Final   Influenza B NOT DETECTED NOT DETECTED Final   Parainfluenza Virus 1 NOT DETECTED NOT DETECTED Final   Parainfluenza Virus 2 NOT DETECTED NOT DETECTED Final   Parainfluenza Virus 3 NOT DETECTED NOT DETECTED Final   Parainfluenza Virus 4 NOT DETECTED NOT DETECTED Final   Respiratory Syncytial Virus NOT DETECTED NOT DETECTED Final   Bordetella pertussis NOT DETECTED NOT DETECTED Final   Chlamydophila pneumoniae NOT DETECTED NOT DETECTED Final   Mycoplasma pneumoniae NOT DETECTED NOT DETECTED Final    Comment: Performed at [facility], [place] [address], [place], [place] [address]  Culture, sputum-assessment     Status: None    Collection Time: 06/10/16  8:59 AM  Result Value Ref Range Status   Specimen Description SPUTUM  Final   Special Requests NONE  Final   Sputum evaluation THIS SPECIMEN IS ACCEPTABLE FOR SPUTUM CULTURE  Final   Report Status 06/10/2016 FINAL  Final  Culture, respiratory (NON-Expectorated)     Status: None   Collection Time: 06/10/16  8:59 AM  Result Value Ref Range Status   Specimen Description SPUTUM  Final   Special Requests NONE Reflexed from [id number]  Final   Gram Stain   Final    FEW WBC PRESENT,BOTH PMN AND MONONUCLEAR RARE GRAM POSITIVE COCCI RARE GRAM NEGATIVE RODS    Culture   Final    Consistent with normal respiratory flora. Performed at [facility], [place] [address], [place], [place] [address]    Report Status 06/12/2016 FINAL  Final  Blood Culture (routine x 2)     Status: None (Preliminary result)   Collection Time: 06/15/16 10:50 PM  Result Value Ref Range Status   Specimen Description BLOOD RIGHT HAND  Final   Special Requests IN PEDIATRIC BOTTLE 4ML  Final   Culture NO GROWTH 3 DAYS  Final   Report Status PENDING  Incomplete  Blood Culture (routine x 2)     Status: None (Preliminary result)   Collection Time: 06/15/16 11:01 PM  Result Value Ref Range Status   Specimen Description BLOOD LEFT ARM  Final   Special Requests BOTTLES DRAWN AEROBIC AND ANAEROBIC 10ML  Final   Culture NO GROWTH 3 DAYS  Final   Report Status PENDING  Incomplete  Urine culture     Status: Abnormal   Collection Time: 06/15/16 11:56 PM  Result Value Ref  Range Status   Specimen Description URINE, RANDOM  Final   Special Requests NONE  Final   Culture >=100,000 COLONIES/mL YEAST (A)  Final   Report Status 06/18/2016 FINAL  Final  MRSA PCR Screening     Status: None   Collection Time: 06/16/16  4:12 AM  Result Value Ref Range Status   MRSA by PCR NEGATIVE NEGATIVE Final    Comment:        The GeneXpert MRSA Assay (FDA approved for NASAL specimens only), is one component of  a comprehensive MRSA colonization surveillance program. It is not intended to diagnose MRSA infection nor to guide or monitor treatment for MRSA infections.     Radiology Reports Dg Chest 2 View  Result Date: 06/09/2016 CLINICAL DATA:  Fever and cough EXAM: CHEST  2 VIEW COMPARISON:  December 11, 2015 FINDINGS: There is consolidation in the posterior left base region. Lungs elsewhere clear. Heart is mildly enlarged. The pulmonary vascularity has a pattern suggesting a degree of pulmonary arterial hypertension. No adenopathy evident. No bone lesions. IMPRESSION: Posterior left base consolidation consistent with pneumonia. Mild cardiomegaly. Suspect a degree of pulmonary arterial hypertension. Followup PA and lateral chest radiographs recommended in 3-4 weeks following trial of antibiotic therapy to ensure resolution and exclude underlying malignancy. Electronically Signed   By: Lowella Grip III M.D.   On: 06/09/2016 15:09   Ct Head Wo Contrast  Result Date: 06/16/2016 CLINICAL DATA:  Altered mental status EXAM: CT HEAD WITHOUT CONTRAST TECHNIQUE: Contiguous axial images were obtained from the base of the skull through the vertex without intravenous contrast. COMPARISON:  12/11/2015 CT FINDINGS: Brain: Mild sulcal prominence bilaterally consistent superficial atrophy. Minimal small vessel ischemic disease of periventricular white matter. No large vascular territory infarction, hemorrhage or midline shift. Left basal ganglial lacunar infarcts appear chronic. Fourth ventricle is midline. No extra-axial fluid collections. Vascular: Moderate atherosclerosis of the carotid siphons. No hyperdense vessels. Skull: Normal. Negative for fracture or focal lesion. Osteopenic appearance of the bony calvarium. Sinuses/Orbits: Inferior mastoid air cells are partially opacified consistent with a mastoid effusion. Minimal ethmoid sinus mucosal thickening. The visualized sphenoid and maxillary sinuses as well as the  frontal sinus appear clear. Other: None IMPRESSION: No acute intracranial abnormality. Chronic small vessel ischemic disease and left-sided basal ganglial lacunar infarcts. Chronic small mastoid effusion on the right. Moderate atherosclerosis of carotid siphons Electronically Signed   By: Ashley Royalty M.D.   On: 06/16/2016 00:25   Dg Chest Port 1 View  Result Date: 06/15/2016 CLINICAL DATA:  Lethargic tonight. Discharge from hospital for days ago with urinary tract infection and pneumonia. EXAM: PORTABLE CHEST 1 VIEW COMPARISON:  06/09/2016 FINDINGS: Shallow inspiration. Cardiac enlargement with mild pulmonary vascular congestion. Bilateral perihilar infiltrates are progressing since previous study and may indicate edema or progressing pneumonia. Small bilateral pleural effusions. Calcified and tortuous aorta. No pneumothorax. IMPRESSION: Progression of perihilar infiltrates may represent developing edema or progressing pneumonia. Small pleural effusions. Electronically Signed   By: Lucienne Capers M.D.   On: 06/15/2016 23:19   Time Spent in minutes  30  MAGICK-Lannie Yusuf M.D on 06/19/2016 at 4:29 PM  Between 7am to 7pm - Pager - [phone]  After 7pm go to [email or web address] - password [email or web address]  [facility] -  Office  [phone]

## 2016-06-19 NOTE — Care Management Important Message (Signed)
Important Message  Patient Details  Name: Elizabeth Pugh MRN: [id number] Date of Birth: Jul 11, 1926   Medicare Important Message Given:  Yes    [name] 06/19/2016, 1:01 PM

## 2016-06-19 NOTE — Progress Notes (Signed)
RT placed patient on bipap. Patient is resting comfortably at this time with no respiratory distress noted. RT will continue to monitor as needed.

## 2016-06-20 LAB — GLUCOSE, CAPILLARY
GLUCOSE-CAPILLARY: 230 mg/dL — AB (ref 65–99)
Glucose-Capillary: 229 mg/dL — ABNORMAL HIGH (ref 65–99)
Glucose-Capillary: 310 mg/dL — ABNORMAL HIGH (ref 65–99)
Glucose-Capillary: 292 mg/dL — ABNORMAL HIGH (ref 65–99)
Glucose-Capillary: 235 mg/dL — ABNORMAL HIGH (ref 65–99)

## 2016-06-20 LAB — CBC
HEMATOCRIT: 34.6 % — AB (ref 36.0–46.0)
HEMOGLOBIN: 10.5 g/dL — AB (ref 12.0–15.0)
MCH: 29.7 pg (ref 26.0–34.0)
MCHC: 30.3 g/dL (ref 30.0–36.0)
MCV: 97.7 fL (ref 78.0–100.0)
Platelets: 155 10*3/uL (ref 150–400)
RBC: 3.54 MIL/uL — ABNORMAL LOW (ref 3.87–5.11)
RDW: 14.3 % (ref 11.5–15.5)
WBC: 3.9 10*3/uL — ABNORMAL LOW (ref 4.0–10.5)

## 2016-06-20 LAB — BASIC METABOLIC PANEL
Anion gap: 10 (ref 5–15)
BUN: 16 mg/dL (ref 6–20)
CALCIUM: 9.3 mg/dL (ref 8.9–10.3)
CHLORIDE: 95 mmol/L — AB (ref 101–111)
CO2: 36 mmol/L — AB (ref 22–32)
CREATININE: 1.09 mg/dL — AB (ref 0.44–1.00)
GFR calc Af Amer: 51 mL/min — ABNORMAL LOW (ref >60)
GFR calc non Af Amer: 44 mL/min — ABNORMAL LOW (ref >60)
Glucose, Bld: 312 mg/dL — ABNORMAL HIGH (ref 65–99)
Potassium: 4 mmol/L (ref 3.5–5.1)
Sodium: 141 mmol/L (ref 135–145)

## 2016-06-20 NOTE — Progress Notes (Signed)
PROGRESS NOTE                                                                                                                                                                                                             Patient Demographics:    Elizabeth Pugh, is a 83 y.o. female, DOB - 10/03/1926, [id number]  Admit date - 06/15/2016   Admitting Physician Rise Patience, MD  Outpatient Primary MD for the patient is Philis Fendt, MD  LOS - 4  Brief Narrative  83 y.o. female recently admitted for pneumonia, discharged 5 days prior to this admission was brought back to the ER after patient's family found pt lethargic and confused. Pt found to have PNA and hypercarbic respiratory failure with encephalopathy. She was placed on BiPAP and antibiotics with improvement.   Subjective:   More alert this am, denies dyspnea or chest pain, reports feeling better.    Assessment  & Plan :   Acute hypercapnic respiratory failure due acute on chronic combined CHF, perihilar pneumonia, unknown pathogen - pt with underlying sleep apnea as well in the setting of morbid obesity, OHS - initially required BiPAP and also needed it last night - better this AM with oxygen sat's at target range on 2 L oxygen via [place] - pt says she is oxygen dependent at baseline, 2 L - creatinine borderline normal, hold off on use of ACEi/ARB's for now - weight trend since admission, continue to monitor - continue Lasix 40 mg IV BID for now as pt still with significant edema and weight is up this AM   Filed Weights   06/18/16 0351 06/19/16 0502 06/20/16 0500  Weight: (!) 136.8 kg (301 lb 9.4 oz) 135.6 kg (298 lb 14.4 oz) (!) 137.1 kg (302 lb 3.2 oz)   Acute metabolic encephalopathy  - appears to be multifactorial secondary to the above - mental status improving overall - Continue to treat pneumonia, CHF  Community acquired pneumonia, perihilar,  present on admission, unknown pathogen - Patient started on vancomycin and maxipime, vancomycin has been discontinued 06/16/2016 - Patient currently on Levaquin, today is a day #5/7 of antibiotic treatment - No plan to change regimen at this time  UTI - yeast in urine, added fluconazole, today is day #3  Morbid obesity - Body mass index is 48.78 kg/m.  Acute kidney injury, hyperkalemia - In the setting of the above - potassium is now within normal limits, continue to avoid ACEI - BMP in the morning  Chronic Atrial fibrillation - [place] vasc 2 score of 8. Continue beta blocker - continue Eliquis   Bilat buttocks skin damage, present on admission and not pressure injury  - pt with elevated BMI and is difficult to turn; required 3 person assist - high risk for skin breakdown related to constant moisture and immobility - Bilat buttocks red and moist with patchy areas of loose peeling skin and red moist partial thickness skin loss; appearance consistent with moisture associated skin damage - multiple areas of peau 'de orange skin changes, red, raised puckered areas to upper thighs, hips, buttocks; appearance consistent with chronic skin changes related to  lymphedema; weeping small amt yellow drainage. - WOC recommends Barrier cream to protect and repel moisture to bilat buttocks and sacrum. Bariatric air mattress to decrease pressure.   DM type II. Uncontrolled - Currently on sliding scale, added Lantus 15 U daily   Lab Results  Component Value Date   HGBA1C 9.4 (H) 06/09/2016   Pancytopenia - Partially reactive in the setting of acute illness, also please note that patient is on blood thinner - Currently no signs of active bleeding - Monitor CBC while inpatient  Diet : DIET SOFT Room service appropriate? Yes; Fluid consistency: Thin    Family Communication  :  No family at bedside this AM   Code Status :  No Intubation  Disposition Plan  :  SNF possibly by  Monday  Consults  :  None  Procedures  :  None  DVT Prophylaxis  :  Eliquis  Lab Results  Component Value Date   PLT 155 06/20/2016   Inpatient Medications  Scheduled Meds: . apixaban  5 mg Oral BID  . chlorhexidine  15 mL Mouth Rinse BID  . fluconazole  100 mg Oral Daily  . furosemide  40 mg Intravenous BID  . insulin aspart  0-5 Units Subcutaneous QHS  . insulin aspart  0-9 Units Subcutaneous TID WC  . insulin glargine  15 Units Subcutaneous QHS  . levETIRAcetam  500 mg Oral BID  . mouth rinse  15 mL Mouth Rinse q12n4p  . metoprolol tartrate  12.5 mg Oral BID  . phenazopyridine  100 mg Oral TID WC  . rOPINIRole  2 mg Oral TID   Continuous Infusions:  PRN Meds:.acetaminophen **OR** acetaminophen, ipratropium-albuterol, ondansetron **OR** ondansetron (ZOFRAN) IV  Antibiotics  :    Anti-infectives    Start     Dose/Rate Route Frequency Ordered Stop   06/18/16 1500  fluconazole (DIFLUCAN) tablet 100 mg     100 mg Oral Daily 06/18/16 1416     06/16/16 1200  levofloxacin (LEVAQUIN) tablet 500 mg     500 mg Oral Daily 06/16/16 0955 06/19/16 0823   06/16/16 0800  ceFEPIme (MAXIPIME) 1 g in dextrose 5 % 50 mL IVPB  Status:  Discontinued     1 g 100 mL/hr over 30 Minutes Intravenous Every 8 hours 06/16/16 0322 06/17/16 1158   06/16/16 0500  vancomycin (VANCOCIN) 1,750 mg in sodium chloride 0.9 % 500 mL IVPB  Status:  Discontinued     1,750 mg 250 mL/hr over 120 Minutes Intravenous Every 24 hours 06/16/16 0448 06/16/16 0955   06/16/16 0345  vancomycin (VANCOCIN) IVPB 1000 mg/200 mL premix  Status:  Discontinued     1,000 mg 200 mL/hr over 60  Minutes Intravenous  Once 06/16/16 0342 06/16/16 0447   06/16/16 0015  vancomycin (VANCOCIN) IVPB 1000 mg/200 mL premix     1,000 mg 200 mL/hr over 60 Minutes Intravenous  Once 06/16/16 0005 06/16/16 0257   06/16/16 0015  ceFEPIme (MAXIPIME) 2 g in dextrose 5 % 50 mL IVPB     2 g 100 mL/hr over 30 Minutes Intravenous  Once 06/16/16  0005 06/16/16 0118      Objective:   Vitals:   06/19/16 1442 06/19/16 1951 06/19/16 2119 06/20/16 0500  BP: 129/81 114/73 (!) 127/101 104/63  Pulse: 94 91 (!) 105 (!) 104  Resp: (!) 24 20 (!) 21 18  Temp: 99.8 F (37.7 C) 97.7 F (36.5 C)  98.7 F (37.1 C)  TempSrc: Oral Oral  Oral  SpO2: 97% 99% 99% 100%  Weight:    (!) 137.1 kg (302 lb 3.2 oz)  Height:        Wt Readings from Last 3 Encounters:  06/20/16 (!) 137.1 kg (302 lb 3.2 oz)  12/17/15 (!) 143.3 kg (316 lb)  10/14/15 127.5 kg (281 lb)     Intake/Output Summary (Last 24 hours) at 06/20/16 1127 Last data filed at 06/20/16 1028  Gross per 24 hour  Intake                0 ml  Output             2200 ml  Net            -2200 ml   Physical Exam Constitutional: Appears well-developed. No distress.  CVS: RRR, S1/S2 +, no murmurs, no gallops, no carotid bruit, LE edema +2  Pulmonary: diminished breath sounds at bases, mild crackles noted  Abdominal: Soft. BS +,  no distension, tenderness, rebound or guarding.  Musculoskeletal: Normal range of motion.    Data Review:   CBC  Recent Labs Lab 06/15/16 2250 06/16/16 0418 06/17/16 0814 06/18/16 0146 06/19/16 0254 06/20/16 0455  WBC 3.8* 3.1* 3.0* 2.7* 2.9* 3.9*  HGB 10.7* 10.9* 9.9* 9.2* 10.0* 10.5*  HCT 35.1* 37.2 31.7* 30.4* 32.9* 34.6*  PLT 156 153 119* 161 157 155  MCV 99.4 100.3* 96.4 97.1 96.5 97.7  MCH 30.3 29.4 30.1 29.4 29.3 29.7  MCHC 30.5 29.3* 31.2 30.3 30.4 30.3  RDW 13.9 14.0 14.2 14.0 14.3 14.3  LYMPHSABS 1.0 1.2  --   --   --   --   MONOABS 0.3 0.3  --   --   --   --   EOSABS 0.1 0.0  --   --   --   --   BASOSABS 0.0 0.0  --   --   --   --     Chemistries   Recent Labs Lab 06/15/16 2250 06/16/16 0418 06/17/16 0814 06/18/16 0146 06/19/16 0254 06/20/16 0455  NA 140 139 141 141 140 141  K 5.3* 5.7* 4.1 3.6 3.6 4.0  CL 102 103 103 101 97* 95*  CO2 33* 29 29 34* 34* 36*  GLUCOSE 226* 185* 163* 312* 278* 312*  BUN 22* 22* 20 20 16  16   CREATININE 1.15* 1.03* 1.02* 1.16* 1.15* 1.09*  CALCIUM 9.6 9.3 8.7* 8.5* 9.1 9.3  AST 11* 16  --   --   --   --   ALT 10* 10*  --   --   --   --   ALKPHOS 45 43  --   --   --   --  BILITOT 0.9 0.9  --   --   --   --    Lab Results  Component Value Date   HGBA1C 9.4 (H) 06/09/2016   Cardiac Enzymes  Recent Labs Lab 06/15/16 2250  TROPONINI <0.03   ------------------------------------------------------------------------------------------------------------------    Component Value Date/Time   BNP 233.2 (H) 06/15/2016 2250   Micro Results Recent Results (from the past 240 hour(s))  Blood Culture (routine x 2)     Status: None (Preliminary result)   Collection Time: 06/15/16 10:50 PM  Result Value Ref Range Status   Specimen Description BLOOD RIGHT HAND  Final   Special Requests IN PEDIATRIC BOTTLE 4ML  Final   Culture NO GROWTH 4 DAYS  Final   Report Status PENDING  Incomplete  Blood Culture (routine x 2)     Status: None (Preliminary result)   Collection Time: 06/15/16 11:01 PM  Result Value Ref Range Status   Specimen Description BLOOD LEFT ARM  Final   Special Requests BOTTLES DRAWN AEROBIC AND ANAEROBIC 10ML  Final   Culture NO GROWTH 4 DAYS  Final   Report Status PENDING  Incomplete  Urine culture     Status: Abnormal   Collection Time: 06/15/16 11:56 PM  Result Value Ref Range Status   Specimen Description URINE, RANDOM  Final   Special Requests NONE  Final   Culture >=100,000 COLONIES/mL YEAST (A)  Final   Report Status 06/18/2016 FINAL  Final  MRSA PCR Screening     Status: None   Collection Time: 06/16/16  4:12 AM  Result Value Ref Range Status   MRSA by PCR NEGATIVE NEGATIVE Final    Comment:        The GeneXpert MRSA Assay (FDA approved for NASAL specimens only), is one component of a comprehensive MRSA colonization surveillance program. It is not intended to diagnose MRSA infection nor to guide or monitor treatment for MRSA infections.      Radiology Reports Dg Chest 2 View  Result Date: 06/09/2016 CLINICAL DATA:  Fever and cough EXAM: CHEST  2 VIEW COMPARISON:  December 11, 2015 FINDINGS: There is consolidation in the posterior left base region. Lungs elsewhere clear. Heart is mildly enlarged. The pulmonary vascularity has a pattern suggesting a degree of pulmonary arterial hypertension. No adenopathy evident. No bone lesions. IMPRESSION: Posterior left base consolidation consistent with pneumonia. Mild cardiomegaly. Suspect a degree of pulmonary arterial hypertension. Followup PA and lateral chest radiographs recommended in 3-4 weeks following trial of antibiotic therapy to ensure resolution and exclude underlying malignancy. Electronically Signed   By: Lowella Grip III M.D.   On: 06/09/2016 15:09   Ct Head Wo Contrast  Result Date: 06/16/2016 CLINICAL DATA:  Altered mental status EXAM: CT HEAD WITHOUT CONTRAST TECHNIQUE: Contiguous axial images were obtained from the base of the skull through the vertex without intravenous contrast. COMPARISON:  12/11/2015 CT FINDINGS: Brain: Mild sulcal prominence bilaterally consistent superficial atrophy. Minimal small vessel ischemic disease of periventricular white matter. No large vascular territory infarction, hemorrhage or midline shift. Left basal ganglial lacunar infarcts appear chronic. Fourth ventricle is midline. No extra-axial fluid collections. Vascular: Moderate atherosclerosis of the carotid siphons. No hyperdense vessels. Skull: Normal. Negative for fracture or focal lesion. Osteopenic appearance of the bony calvarium. Sinuses/Orbits: Inferior mastoid air cells are partially opacified consistent with a mastoid effusion. Minimal ethmoid sinus mucosal thickening. The visualized sphenoid and maxillary sinuses as well as the frontal sinus appear clear. Other: None IMPRESSION: No acute intracranial abnormality. Chronic small  vessel ischemic disease and left-sided basal ganglial lacunar  infarcts. Chronic small mastoid effusion on the right. Moderate atherosclerosis of carotid siphons Electronically Signed   By: Ashley Royalty M.D.   On: 06/16/2016 00:25   Dg Chest Port 1 View  Result Date: 06/15/2016 CLINICAL DATA:  Lethargic tonight. Discharge from hospital for days ago with urinary tract infection and pneumonia. EXAM: PORTABLE CHEST 1 VIEW COMPARISON:  06/09/2016 FINDINGS: Shallow inspiration. Cardiac enlargement with mild pulmonary vascular congestion. Bilateral perihilar infiltrates are progressing since previous study and may indicate edema or progressing pneumonia. Small bilateral pleural effusions. Calcified and tortuous aorta. No pneumothorax. IMPRESSION: Progression of perihilar infiltrates may represent developing edema or progressing pneumonia. Small pleural effusions. Electronically Signed   By: Lucienne Capers M.D.   On: 06/15/2016 23:19   Time Spent in minutes  30  Faye Ramsay M.D on 06/20/2016 at 11:27 AM  Between 7am to 7pm - Pager - [phone]  After 7pm go to [email or web address] - password [facility]  [facility] -  Office  [phone]

## 2016-06-20 NOTE — Progress Notes (Signed)
Pt refusing BIPAP at this time. RT will check back.

## 2016-06-21 LAB — CBC
HCT: 35.9 % — ABNORMAL LOW (ref 36.0–46.0)
HEMOGLOBIN: 10.5 g/dL — AB (ref 12.0–15.0)
MCH: 29.1 pg (ref 26.0–34.0)
MCHC: 29.2 g/dL — AB (ref 30.0–36.0)
MCV: 99.4 fL (ref 78.0–100.0)
PLATELETS: 132 10*3/uL — AB (ref 150–400)
RBC: 3.61 MIL/uL — AB (ref 3.87–5.11)
RDW: 14.2 % (ref 11.5–15.5)
WBC: 2.8 10*3/uL — ABNORMAL LOW (ref 4.0–10.5)

## 2016-06-21 LAB — BASIC METABOLIC PANEL
Anion gap: 7 (ref 5–15)
BUN: 12 mg/dL (ref 6–20)
CO2: 38 mmol/L — ABNORMAL HIGH (ref 22–32)
CREATININE: 1.04 mg/dL — AB (ref 0.44–1.00)
Calcium: 8.9 mg/dL (ref 8.9–10.3)
Chloride: 94 mmol/L — ABNORMAL LOW (ref 101–111)
GFR calc non Af Amer: 47 mL/min — ABNORMAL LOW (ref >60)
GFR, EST AFRICAN AMERICAN: 54 mL/min — AB (ref >60)
Glucose, Bld: 221 mg/dL — ABNORMAL HIGH (ref 65–99)
Potassium: 3.6 mmol/L (ref 3.5–5.1)
SODIUM: 139 mmol/L (ref 135–145)

## 2016-06-21 LAB — GLUCOSE, CAPILLARY
GLUCOSE-CAPILLARY: 193 mg/dL — AB (ref 65–99)
GLUCOSE-CAPILLARY: 207 mg/dL — AB (ref 65–99)
Glucose-Capillary: 259 mg/dL — ABNORMAL HIGH (ref 65–99)
Glucose-Capillary: 229 mg/dL — ABNORMAL HIGH (ref 65–99)
Glucose-Capillary: 232 mg/dL — ABNORMAL HIGH (ref 65–99)

## 2016-06-21 LAB — CULTURE, BLOOD (ROUTINE X 2)
CULTURE: NO GROWTH
Culture: NO GROWTH

## 2016-06-21 NOTE — NC FL2 (Signed)
[place] LEVEL OF CARE SCREENING TOOL     IDENTIFICATION  Patient Name: Elizabeth Pugh Birthdate: 24-May-1926 Sex: female Admission Date (Current Location): 06/15/2016  [facility] [place] [facility] and [place] Number:  Herbalist and Address:  [facility], [place] [address], [place], [place] [address]      Provider Number: [id number]  Attending Physician Name and Address:  Theodis Blaze, MD  Relative Name and Phone Number:  Dtr [phone]    Current Level of Care: Hospital Recommended Level of Care: [place] Prior Approval Number:    Date Approved/Denied:   PASRR Number: [id number]  Discharge Plan: SNF    Current Diagnoses: Patient Active Problem List   Diagnosis Date Noted  . Acute respiratory failure with hypoxia and hypercapnia ([place]) 06/16/2016  . Urinary tract infection without hematuria 06/16/2016  . HCAP (healthcare-associated pneumonia) 06/16/2016  . Acute respiratory failure ([place]) 06/16/2016  . CAP (community acquired pneumonia) 06/09/2016  . Cellulitis 12/16/2015  . Confusion 12/11/2015  . SIRS (systemic inflammatory response syndrome) ([place]) 12/11/2015  . Chronic respiratory failure with hypoxia and hypercapnia ([place]) 09/28/2015  . Acute encephalopathy   . Acute on chronic congestive heart failure ([place])   . Needs sleep apnea assessment   . Acute on chronic diastolic (congestive) heart failure 09/22/2015  . Acute kidney injury ([place]) 09/02/2015  . Lobar pneumonia due to unspecified organism 09/02/2015  . Bilateral leg edema   . Acute respiratory failure with hypoxia ([place]) 08/29/2015  . Acute bronchitis 08/29/2015  . Acute hyperkalemia 08/29/2015  . History of pulmonary embolism 01/27/2015  . Chest pain at rest 01/27/2015  . Shortness of breath at rest 01/27/2015  . Chronic diastolic heart failure ([place]) 01/27/2015  . Thrombocytopenia ([place]) 01/27/2015  . Abnormal findings on radiological examination of  gastrointestinal tract 01/27/2015  . Chronic atrial fibrillation ([employer])   . Seizure disorder ([place])   . Anxiety   . Anemia   . Restless leg syndrome   . Acute delirium 04/12/2011  . DM (diabetes mellitus), type 2, uncontrolled with complications ([place]) 0000000  . CVA, old, hemiparesis ([place]) 03/25/2011  . Hypertension 03/25/2011    Orientation RESPIRATION BLADDER Height & Weight     Self, Time, Situation, Place  (S) Normal, O2 (3 ltrs) Incontinent Weight: 299 lb 12.8 oz (136 kg) Height:  5\' 6"  (167.6 cm)  BEHAVIORAL SYMPTOMS/MOOD NEUROLOGICAL BOWEL NUTRITION STATUS      Incontinent Diet (See DC Summary)  AMBULATORY STATUS COMMUNICATION OF NEEDS Skin   Limited Assist Verbally Normal                       Personal Care Assistance Level of Assistance  Bathing Bathing Assistance: Limited assistance   Dressing Assistance: Limited assistance     Functional Limitations Info  Speech, Hearing, Sight Sight Info: Adequate Hearing Info: Adequate Speech Info: Adequate    SPECIAL CARE FACTORS FREQUENCY  PT (By licensed PT), OT (By licensed OT)     PT Frequency: 5x wk OT Frequency: 5x wk            Contractures Contractures Info: Not present    Additional Factors Info  Code Status, Allergies Code Status Info: Partial Code Allergies Info: Erythromycin, Penicillins, Zithromax Azithromycin           Current Medications (06/21/2016):  This is the current hospital active medication list Current Facility-Administered Medications  Medication Dose Route Frequency Provider Last Rate Last Dose  .  acetaminophen (TYLENOL) tablet 650 mg  650 mg Oral Q6H PRN Rise Patience, MD   650 mg at 06/17/16 1958   Or  . acetaminophen (TYLENOL) suppository 650 mg  650 mg Rectal Q6H PRN Rise Patience, MD      . apixaban Arne Cleveland) tablet 5 mg  5 mg Oral BID Kris Mouton, RPH   5 mg at 06/21/16 0818  . chlorhexidine (PERIDEX) 0.12 % solution 15 mL  15 mL Mouth Rinse BID Rise Patience, MD   15 mL at 06/21/16 0819  . fluconazole (DIFLUCAN) tablet 100 mg  100 mg Oral Daily Theodis Blaze, MD   100 mg at 06/21/16 0815  . furosemide (LASIX) injection 40 mg  40 mg Intravenous BID Thurnell Lose, MD   40 mg at 06/21/16 0815  . insulin aspart (novoLOG) injection 0-5 Units  0-5 Units Subcutaneous QHS Theodis Blaze, MD   2 Units at 06/20/16 2110  . insulin aspart (novoLOG) injection 0-9 Units  0-9 Units Subcutaneous TID WC Theodis Blaze, MD   5 Units at 06/21/16 1214  . insulin glargine (LANTUS) injection 15 Units  15 Units Subcutaneous QHS Theodis Blaze, MD   15 Units at 06/20/16 2053  . ipratropium-albuterol (DUONEB) 0.5-2.5 (3) MG/3ML nebulizer solution 3 mL  3 mL Nebulization Q6H PRN Rise Patience, MD      . levETIRAcetam (KEPPRA) tablet 500 mg  500 mg Oral BID Kris Mouton, RPH   500 mg at 06/21/16 0818  . MEDLINE mouth rinse  15 mL Mouth Rinse q12n4p Rise Patience, MD   15 mL at 06/21/16 1216  . metoprolol tartrate (LOPRESSOR) tablet 12.5 mg  12.5 mg Oral BID Theodis Blaze, MD   12.5 mg at 06/21/16 0816  . ondansetron (ZOFRAN) tablet 4 mg  4 mg Oral Q6H PRN Rise Patience, MD       Or  . ondansetron [facility]) injection 4 mg  4 mg Intravenous Q6H PRN Rise Patience, MD      . phenazopyridine (PYRIDIUM) tablet 100 mg  100 mg Oral TID WC Theodis Blaze, MD   100 mg at 06/21/16 1213  . rOPINIRole (REQUIP) tablet 2 mg  2 mg Oral TID Theodis Blaze, MD   2 mg at 06/21/16 [id number]     Discharge Medications: Please see discharge summary for a list of discharge medications.  Relevant Imaging Results:  Relevant Lab Results:   Additional Information [phone]  Eliberto Sole B, LCSWA

## 2016-06-21 NOTE — Progress Notes (Signed)
Occupational Therapy Treatment Patient Details Name: Elizabeth Pugh MRN: [id number] DOB: Nov 22, 1926 Today's Date: 06/21/2016    History of present illness Pt admitted with PNA and hypercarbic respiratory failure with encephalopathy requiring bipap. Pt with recent admission for PNA. PMH: CVA, DM, CHF.   OT comments  Pt making progress towards OT goals this session. HEP provided (worksheets included) and reviewed with Pt. Pt demonstrated competency in all theraband exercises, and warm up with arm circles. Pt very agreeable, excited, and pleasant during session. Next session to focus on family education and to ensure Pt understanding and HEP performance.   Follow Up Recommendations  No OT follow up;Supervision/Assistance - 24 hour    Equipment Recommendations  None recommended by OT    Recommendations for Other Services      Precautions / Restrictions Precautions Precautions: Fall Restrictions Weight Bearing Restrictions: No       Mobility Bed Mobility                  Transfers                      Balance                                   ADL Overall ADL's : At baseline                                       General ADL Comments: Pt can self feed and perform some grooming at bed level.      Vision                     Perception     Praxis      Cognition   Behavior During Therapy: [facility] for tasks assessed/performed Overall Cognitive Status: Within Functional Limits for tasks assessed                       Extremity/Trunk Assessment               Exercises General Exercises - Upper Extremity Shoulder Flexion: AROM;Both;10 reps;Supine;Theraband Theraband Level (Shoulder Flexion): Level 1 (Yellow) Shoulder Extension: AROM;Other reps (comment) (out to full flexion and 10 small circles) Shoulder ADduction: AROM;Both;Theraband Theraband Level (Shoulder Adduction): Level 1 (Yellow) Shoulder  Horizontal ADduction: AROM;Both;10 reps;Supine;Theraband Theraband Level (Shoulder Horizontal Adduction): Level 1 (Yellow)   Shoulder Instructions       General Comments      Pertinent Vitals/ Pain       Pain Assessment: No/denies pain  Home Living                                          Prior Functioning/Environment              Frequency  Min 2X/week        Progress Toward Goals  OT Goals(current goals can now be found in the care plan section)  Progress towards OT goals: Progressing toward goals  Acute Rehab OT Goals Patient Stated Goal: family would like pt to be able to assist more with rolling for care OT Goal Formulation: With patient Potential to Achieve Goals: Good  Plan Discharge plan remains appropriate  Co-evaluation                 End of Session Equipment Utilized During Treatment: Other (comment) (Theraband)   Activity Tolerance Patient tolerated treatment well   Patient Left in bed;with call bell/phone within reach   Nurse Communication Mobility status;Other (comment) (Pt wanting to know if MD would be in today)        Time: [id number] OT Time Calculation (min): 34 min  Charges: OT General Charges $OT Visit: 1 Procedure OT Treatments $Therapeutic Exercise: 23-37 mins  Jaci Carrel 06/21/2016, 4:25 PM  Hulda Humphrey OTR/L [phone]

## 2016-06-21 NOTE — Progress Notes (Signed)
PROGRESS NOTE                                                                                                                                                                                                             Patient Demographics:    Elizabeth Pugh, is a 83 y.o. female, DOB - Nov 30, 1926, [id number]  Admit date - 06/15/2016   Admitting Physician Rise Patience, MD  Outpatient Primary MD for the patient is Philis Fendt, MD  LOS - 5  Brief Narrative  83 y.o. female recently admitted for pneumonia, discharged 5 days prior to this admission was brought back to the ER after patient's family found pt lethargic and confused. Pt found to have PNA and hypercarbic respiratory failure with encephalopathy. She was placed on BiPAP and antibiotics with improvement.   Subjective:   More alert this am, denies dyspnea or chest pain, reports feeling better.    Assessment  & Plan :   Acute hypercapnic respiratory failure due acute on chronic combined CHF, perihilar pneumonia, unknown pathogen - pt with underlying sleep apnea as well in the setting of morbid obesity, OHS - initially required BiPAP and also needed it last night - better this AM with oxygen sat's at target range on 2 L oxygen via [place] - pt says she is oxygen dependent at baseline, 2 L - creatinine borderline normal, hold off on use of ACEi/ARB's for now - weight trend since admission, continue to monitor - continue Lasix 40 mg IV BID for now, plan to change to PO regimen in AM  Filed Weights   06/19/16 0502 06/20/16 0500 06/21/16 0500  Weight: 135.6 kg (298 lb 14.4 oz) (!) 137.1 kg (302 lb 3.2 oz) 136 kg (299 lb 12.8 oz)   Acute metabolic encephalopathy  - appears to be multifactorial secondary to the above - mental status improving overall - Continue to treat pneumonia, CHF - ask PT to see again to evaluate if pt would benefit from SNF placement    Community acquired pneumonia, perihilar, present on admission, unknown pathogen - Patient started on vancomycin and maxipime, vancomycin has been discontinued 06/16/2016 - Patient currently on Levaquin, today is a day #6/7 of antibiotic treatment - No plan to change regimen at this time  UTI - yeast in urine, added fluconazole, today is day #4  Morbid  obesity - Body mass index is 48.39 kg/m.   Acute kidney injury, hyperkalemia - In the setting of the above - potassium is now within normal limits, continue to avoid ACEI - BMP in the morning  Chronic Atrial fibrillation - [place] vasc 2 score of 8. Continue beta blocker - continue Eliquis   Bilat buttocks skin damage, present on admission and not pressure injury  - pt with elevated BMI and is difficult to turn; required 3 person assist - high risk for skin breakdown related to constant moisture and immobility - Bilat buttocks red and moist with patchy areas of loose peeling skin and red moist partial thickness skin loss; appearance consistent with moisture associated skin damage - multiple areas of peau 'de orange skin changes, red, raised puckered areas to upper thighs, hips, buttocks; appearance consistent with chronic skin changes related to  lymphedema; weeping small amt yellow drainage. - WOC recommends Barrier cream to protect and repel moisture to bilat buttocks and sacrum. Bariatric air mattress to decrease pressure.   DM type II. Uncontrolled - Currently on sliding scale, added Lantus 15 U daily   Lab Results  Component Value Date   HGBA1C 9.4 (H) 06/09/2016   Pancytopenia - Partially reactive in the setting of acute illness, also please note that patient is on blood thinner - Currently no signs of active bleeding - Monitor CBC while inpatient  Diet : DIET SOFT Room service appropriate? Yes; Fluid consistency: Thin    Family Communication  :  No family at bedside this AM   Code Status :  No  Intubation  Disposition Plan  :  SNF vs home possibly by Monday  Consults  :  None  Procedures  :  None  DVT Prophylaxis  :  Eliquis  Lab Results  Component Value Date   PLT 132 (L) 06/21/2016   Inpatient Medications  Scheduled Meds: . apixaban  5 mg Oral BID  . chlorhexidine  15 mL Mouth Rinse BID  . fluconazole  100 mg Oral Daily  . furosemide  40 mg Intravenous BID  . insulin aspart  0-5 Units Subcutaneous QHS  . insulin aspart  0-9 Units Subcutaneous TID WC  . insulin glargine  15 Units Subcutaneous QHS  . levETIRAcetam  500 mg Oral BID  . mouth rinse  15 mL Mouth Rinse q12n4p  . metoprolol tartrate  12.5 mg Oral BID  . phenazopyridine  100 mg Oral TID WC  . rOPINIRole  2 mg Oral TID   Continuous Infusions:  PRN Meds:.acetaminophen **OR** acetaminophen, ipratropium-albuterol, ondansetron **OR** ondansetron (ZOFRAN) IV  Antibiotics  :    Anti-infectives    Start     Dose/Rate Route Frequency Ordered Stop   06/18/16 1500  fluconazole (DIFLUCAN) tablet 100 mg     100 mg Oral Daily 06/18/16 1416     06/16/16 1200  levofloxacin (LEVAQUIN) tablet 500 mg     500 mg Oral Daily 06/16/16 0955 06/19/16 0823   06/16/16 0800  ceFEPIme (MAXIPIME) 1 g in dextrose 5 % 50 mL IVPB  Status:  Discontinued     1 g 100 mL/hr over 30 Minutes Intravenous Every 8 hours 06/16/16 0322 06/17/16 1158   06/16/16 0500  vancomycin (VANCOCIN) 1,750 mg in sodium chloride 0.9 % 500 mL IVPB  Status:  Discontinued     1,750 mg 250 mL/hr over 120 Minutes Intravenous Every 24 hours 06/16/16 0448 06/16/16 0955   06/16/16 0345  vancomycin (VANCOCIN) IVPB 1000 mg/200 mL premix  Status:  Discontinued     1,000 mg 200 mL/hr over 60 Minutes Intravenous  Once 06/16/16 0342 06/16/16 0447   06/16/16 0015  vancomycin (VANCOCIN) IVPB 1000 mg/200 mL premix     1,000 mg 200 mL/hr over 60 Minutes Intravenous  Once 06/16/16 0005 06/16/16 0257   06/16/16 0015  ceFEPIme (MAXIPIME) 2 g in dextrose 5 % 50 mL IVPB      2 g 100 mL/hr over 30 Minutes Intravenous  Once 06/16/16 0005 06/16/16 0118      Objective:   Vitals:   06/20/16 2104 06/21/16 0033 06/21/16 0500 06/21/16 0815  BP: 122/63 124/75 118/85 (!) 127/91  Pulse: 85 (!) 102 89 86  Resp:  16 15   Temp:  97.7 F (36.5 C) 97.5 F (36.4 C)   TempSrc:  Axillary Axillary   SpO2:  97% 100%   Weight:   136 kg (299 lb 12.8 oz)   Height:        Wt Readings from Last 3 Encounters:  06/21/16 136 kg (299 lb 12.8 oz)  12/17/15 (!) 143.3 kg (316 lb)  10/14/15 127.5 kg (281 lb)     Intake/Output Summary (Last 24 hours) at 06/21/16 1338 Last data filed at 06/21/16 0500  Gross per 24 hour  Intake                0 ml  Output              500 ml  Net             -500 ml   Physical Exam Constitutional: Appears well-developed. No distress.  CVS: RRR, S1/S2 +, no murmurs, no gallops, no carotid bruit, LE edema +2  Pulmonary: diminished breath sounds at bases, mild crackles noted  Abdominal: Soft. BS +,  no distension, tenderness, rebound or guarding.  Musculoskeletal: Normal range of motion.    Data Review:   CBC  Recent Labs Lab 06/15/16 2250 06/16/16 0418 06/17/16 0814 06/18/16 0146 06/19/16 0254 06/20/16 0455 06/21/16 0344  WBC 3.8* 3.1* 3.0* 2.7* 2.9* 3.9* 2.8*  HGB 10.7* 10.9* 9.9* 9.2* 10.0* 10.5* 10.5*  HCT 35.1* 37.2 31.7* 30.4* 32.9* 34.6* 35.9*  PLT 156 153 119* 161 157 155 132*  MCV 99.4 100.3* 96.4 97.1 96.5 97.7 99.4  MCH 30.3 29.4 30.1 29.4 29.3 29.7 29.1  MCHC 30.5 29.3* 31.2 30.3 30.4 30.3 29.2*  RDW 13.9 14.0 14.2 14.0 14.3 14.3 14.2  LYMPHSABS 1.0 1.2  --   --   --   --   --   MONOABS 0.3 0.3  --   --   --   --   --   EOSABS 0.1 0.0  --   --   --   --   --   BASOSABS 0.0 0.0  --   --   --   --   --     Chemistries   Recent Labs Lab 06/15/16 2250 06/16/16 0418 06/17/16 0814 06/18/16 0146 06/19/16 0254 06/20/16 0455 06/21/16 0344  NA 140 139 141 141 140 141 139  K 5.3* 5.7* 4.1 3.6 3.6 4.0 3.6  CL  102 103 103 101 97* 95* 94*  CO2 33* 29 29 34* 34* 36* 38*  GLUCOSE 226* 185* 163* 312* 278* 312* 221*  BUN 22* 22* 20 20 16 16 12   CREATININE 1.15* 1.03* 1.02* 1.16* 1.15* 1.09* 1.04*  CALCIUM 9.6 9.3 8.7* 8.5* 9.1 9.3 8.9  AST 11* 16  --   --   --   --   --  ALT 10* 10*  --   --   --   --   --   ALKPHOS 45 43  --   --   --   --   --   BILITOT 0.9 0.9  --   --   --   --   --    Lab Results  Component Value Date   HGBA1C 9.4 (H) 06/09/2016   Cardiac Enzymes  Recent Labs Lab 06/15/16 2250  TROPONINI <0.03   ------------------------------------------------------------------------------------------------------------------    Component Value Date/Time   BNP 233.2 (H) 06/15/2016 2250   Micro Results Recent Results (from the past 240 hour(s))  Blood Culture (routine x 2)     Status: None   Collection Time: 06/15/16 10:50 PM  Result Value Ref Range Status   Specimen Description BLOOD RIGHT HAND  Final   Special Requests IN PEDIATRIC BOTTLE 4ML  Final   Culture NO GROWTH 5 DAYS  Final   Report Status 06/21/2016 FINAL  Final  Blood Culture (routine x 2)     Status: None   Collection Time: 06/15/16 11:01 PM  Result Value Ref Range Status   Specimen Description BLOOD LEFT ARM  Final   Special Requests BOTTLES DRAWN AEROBIC AND ANAEROBIC 10ML  Final   Culture NO GROWTH 5 DAYS  Final   Report Status 06/21/2016 FINAL  Final  Urine culture     Status: Abnormal   Collection Time: 06/15/16 11:56 PM  Result Value Ref Range Status   Specimen Description URINE, RANDOM  Final   Special Requests NONE  Final   Culture >=100,000 COLONIES/mL YEAST (A)  Final   Report Status 06/18/2016 FINAL  Final  MRSA PCR Screening     Status: None   Collection Time: 06/16/16  4:12 AM  Result Value Ref Range Status   MRSA by PCR NEGATIVE NEGATIVE Final    Comment:        The GeneXpert MRSA Assay (FDA approved for NASAL specimens only), is one component of a comprehensive MRSA  colonization surveillance program. It is not intended to diagnose MRSA infection nor to guide or monitor treatment for MRSA infections.     Radiology Reports Dg Chest 2 View  Result Date: 06/09/2016 CLINICAL DATA:  Fever and cough EXAM: CHEST  2 VIEW COMPARISON:  December 11, 2015 FINDINGS: There is consolidation in the posterior left base region. Lungs elsewhere clear. Heart is mildly enlarged. The pulmonary vascularity has a pattern suggesting a degree of pulmonary arterial hypertension. No adenopathy evident. No bone lesions. IMPRESSION: Posterior left base consolidation consistent with pneumonia. Mild cardiomegaly. Suspect a degree of pulmonary arterial hypertension. Followup PA and lateral chest radiographs recommended in 3-4 weeks following trial of antibiotic therapy to ensure resolution and exclude underlying malignancy. Electronically Signed   By: Lowella Grip III M.D.   On: 06/09/2016 15:09   Ct Head Wo Contrast  Result Date: 06/16/2016 CLINICAL DATA:  Altered mental status EXAM: CT HEAD WITHOUT CONTRAST TECHNIQUE: Contiguous axial images were obtained from the base of the skull through the vertex without intravenous contrast. COMPARISON:  12/11/2015 CT FINDINGS: Brain: Mild sulcal prominence bilaterally consistent superficial atrophy. Minimal small vessel ischemic disease of periventricular white matter. No large vascular territory infarction, hemorrhage or midline shift. Left basal ganglial lacunar infarcts appear chronic. Fourth ventricle is midline. No extra-axial fluid collections. Vascular: Moderate atherosclerosis of the carotid siphons. No hyperdense vessels. Skull: Normal. Negative for fracture or focal lesion. Osteopenic appearance of the bony calvarium. Sinuses/Orbits: Inferior  mastoid air cells are partially opacified consistent with a mastoid effusion. Minimal ethmoid sinus mucosal thickening. The visualized sphenoid and maxillary sinuses as well as the frontal sinus appear  clear. Other: None IMPRESSION: No acute intracranial abnormality. Chronic small vessel ischemic disease and left-sided basal ganglial lacunar infarcts. Chronic small mastoid effusion on the right. Moderate atherosclerosis of carotid siphons Electronically Signed   By: Ashley Royalty M.D.   On: 06/16/2016 00:25   Dg Chest Port 1 View  Result Date: 06/15/2016 CLINICAL DATA:  Lethargic tonight. Discharge from hospital for days ago with urinary tract infection and pneumonia. EXAM: PORTABLE CHEST 1 VIEW COMPARISON:  06/09/2016 FINDINGS: Shallow inspiration. Cardiac enlargement with mild pulmonary vascular congestion. Bilateral perihilar infiltrates are progressing since previous study and may indicate edema or progressing pneumonia. Small bilateral pleural effusions. Calcified and tortuous aorta. No pneumothorax. IMPRESSION: Progression of perihilar infiltrates may represent developing edema or progressing pneumonia. Small pleural effusions. Electronically Signed   By: Lucienne Capers M.D.   On: 06/15/2016 23:19   Time Spent in minutes  30  Faye Ramsay M.D on 06/21/2016 at 1:38 PM  Between 7am to 7pm - Pager - [phone]  After 7pm go to [email or web address] - password [facility]  [facility] -  Office  [phone]

## 2016-06-21 NOTE — Clinical Social Work Placement (Signed)
   CLINICAL SOCIAL WORK PLACEMENT  NOTE  Date:  06/21/2016  Patient Details  Name: Elizabeth Pugh MRN: [id number] Date of Birth: 13-Oct-1926  Clinical Social Work is seeking post-discharge placement for this patient at the [place] level of care (*CSW will initial, date and re-position this form in  chart as items are completed):  Yes   Patient/family provided with [place] Work Department's list of facilities offering this level of care within the geographic area requested by the patient (or if unable, by the patient's family).  Yes   Patient/family informed of their freedom to choose among providers that offer the needed level of care, that participate in Medicare, Medicaid or managed care program needed by the patient, have an available bed and are willing to accept the patient.  Yes   Patient/family informed of [facility]'s ownership interest in [facility] and [facility], as well as of the fact that they are under no obligation to receive care at these facilities.  PASRR submitted to EDS on       PASRR number received on       Existing PASRR number confirmed on 06/21/16     FL2 transmitted to all facilities in geographic area requested by pt/family on 06/21/16     FL2 transmitted to all facilities within larger geographic area on       Patient informed that his/her managed care company has contracts with or will negotiate with certain facilities, including the following:            Patient/family informed of bed offers received.  Patient chooses bed at       Physician recommends and patient chooses bed at      Patient to be transferred to   on  .  Patient to be transferred to facility by       Patient family notified on   of transfer.  Name of family member notified:        PHYSICIAN Please sign FL2     Additional Comment:    _______________________________________________ Serafina Mitchell, LCSWA 06/21/2016, 3:24  PM

## 2016-06-21 NOTE — Progress Notes (Signed)
Patient refused BIPAP tonight stating that she wanted to just wear her nasal cannula tonight. RT informed patient if she changes her mind to have RN call RT. RT will monitor as needed.

## 2016-06-22 LAB — CBC
HCT: 35.1 % — ABNORMAL LOW (ref 36.0–46.0)
Hemoglobin: 8.4 g/dL — ABNORMAL LOW (ref 12.0–15.0)
MCH: 23.7 pg — ABNORMAL LOW (ref 26.0–34.0)
MCHC: 23.9 g/dL — AB (ref 30.0–36.0)
MCV: 98.9 fL (ref 78.0–100.0)
PLATELETS: 149 10*3/uL — AB (ref 150–400)
RBC: 3.55 MIL/uL — AB (ref 3.87–5.11)
RDW: 13.9 % (ref 11.5–15.5)
WBC: 4.1 10*3/uL (ref 4.0–10.5)

## 2016-06-22 LAB — GLUCOSE, CAPILLARY
GLUCOSE-CAPILLARY: 244 mg/dL — AB (ref 65–99)
GLUCOSE-CAPILLARY: 291 mg/dL — AB (ref 65–99)
Glucose-Capillary: 295 mg/dL — ABNORMAL HIGH (ref 65–99)
Glucose-Capillary: 313 mg/dL — ABNORMAL HIGH (ref 65–99)

## 2016-06-22 LAB — BASIC METABOLIC PANEL
Anion gap: 8 (ref 5–15)
BUN: 18 mg/dL (ref 6–20)
CHLORIDE: 91 mmol/L — AB (ref 101–111)
CO2: 39 mmol/L — ABNORMAL HIGH (ref 22–32)
CREATININE: 1.2 mg/dL — AB (ref 0.44–1.00)
Calcium: 8.8 mg/dL — ABNORMAL LOW (ref 8.9–10.3)
GFR, EST AFRICAN AMERICAN: 45 mL/min — AB (ref >60)
GFR, EST NON AFRICAN AMERICAN: 39 mL/min — AB (ref >60)
Glucose, Bld: 271 mg/dL — ABNORMAL HIGH (ref 65–99)
Potassium: 3.6 mmol/L (ref 3.5–5.1)
SODIUM: 138 mmol/L (ref 135–145)

## 2016-06-22 MED ORDER — FUROSEMIDE 40 MG PO TABS
40.0000 mg | ORAL_TABLET | Freq: Two times a day (BID) | ORAL | Status: DC
  Administered 2016-06-23 (×2): 40 mg via ORAL
  Filled 2016-06-22 (×2): qty 1

## 2016-06-22 MED ORDER — INSULIN GLARGINE 100 UNIT/ML ~~LOC~~ SOLN
20.0000 [IU] | Freq: Every day | SUBCUTANEOUS | Status: DC
  Administered 2016-06-22: 20 [IU] via SUBCUTANEOUS
  Filled 2016-06-22 (×2): qty 0.2

## 2016-06-22 MED ORDER — DIPHENHYDRAMINE HCL 25 MG PO CAPS: 25.0000 mg | ORAL_CAPSULE | ORAL | Status: DC | PRN

## 2016-06-22 MED ORDER — DIPHENHYDRAMINE HCL 25 MG PO CAPS
50.0000 mg | ORAL_CAPSULE | Freq: Once | ORAL | Status: AC
  Administered 2016-06-22: 50 mg via ORAL
  Filled 2016-06-22: qty 2

## 2016-06-22 NOTE — Clinical Social Work Note (Signed)
Clinical Social Work Assessment  Patient Details  Name: Elizabeth Pugh MRN: [id number] Date of Birth: Aug 22, 1926  Date of referral:  06/22/16               Reason for consult:  Facility Placement, Discharge Planning                Permission sought to share information with:  Facility Sport and exercise psychologist, Family Supports Permission granted to share information::  Yes, Verbal Permission Granted  Name::     Elizabeth Pugh and Associate Professor::  SNFs  Relationship::     Contact Information:     Housing/Transportation Living arrangements for the past 2 months:  Single Family Home Source of Information:  Patient, Adult Children Patient Interpreter Needed:  None Criminal Activity/Legal Involvement Pertinent to Current Situation/Hospitalization:  No - Comment as needed Significant Relationships:  Adult Children, Other Family Members Lives with:  Adult Children Do you feel safe going back to the place where you live?  Yes Need for family participation in patient care:  Yes (Comment)  Care giving concerns:  The patient would like to go home but the family reports that the patient would be best served by short term rehab before returning home.   Social Worker assessment / plan:  CSW met with patient at bedside spoke with daughter Elizabeth Pugh by phone. The patient verbalizes that she would like to go home at discharge but understands that the family feels her care needs may be to much at this time to return home. The daughter Elizabeth Pugh would like the patient to go to [facility] SNF if possible but is open to other options as well. CSW explained SNF search/placement process and answered the family's questions. Patient would like to defer to Elizabeth Pugh's decisions. CSW will follow up with available bed offers.  Employment status:  [employer] information:  Medicare PT Recommendations:  24 Hour Supervision Information / Referral to community resources:  [place]  Patient/Family's  Response to care:  The patient and family appear happy with the care the patient has received. Both appreciate CSW involvement.  Patient/Family's Understanding of and Emotional Response to Diagnosis, Current Treatment, and Prognosis:  The patient and family appear to have a good understanding of the patient's diagnosis and current medical needs. The patient hopes to return home soon to be with her family but understands that SNF is needed at this time.   Emotional Assessment Appearance:  Appears stated age Attitude/Demeanor/Rapport:  Other (Patient appropriate and welcoming of CSW.) Affect (typically observed):  Accepting, Appropriate, Calm, Pleasant Orientation:  Oriented to Self, Oriented to Place, Oriented to  Time, Oriented to Situation Alcohol / Substance use:  Not Applicable Psych involvement (Current and /or in the community):  No (Comment)  Discharge Needs  Concerns to be addressed:  Discharge Planning Concerns, Care Coordination Readmission within the last 30 days:  No Current discharge risk:  Chronically ill, Physical Impairment Barriers to Discharge:  Continued Medical Work up   Fredderick Phenix B, LCSW 06/22/2016, 10:27 AM

## 2016-06-22 NOTE — Progress Notes (Signed)
PT Cancellation Note/ Discharge  Patient Details Name: Elizabeth Pugh MRN: [id number] DOB: 08-29-1926   Cancelled Treatment:    Reason Eval/Treat Not Completed: PT screened, no needs identified, will sign off (PT eval and D/C completed 2/15. pt total assist at baseline with lift for OOB. Pt's daughter recently had surgery and unable to assist but pt states that niece and other family will be home to assist. No change in pt function or D/C plan. No role for acute P.T. At this time and will defer additional evaluation with pt aware and agreeable.)   Tamico Mundo B Pete Schnitzer 06/22/2016, 9:48 AM Elwyn Reach, [place]

## 2016-06-22 NOTE — Care Management Note (Addendum)
Case Management Note  Patient Details  Name: Elizabeth Pugh MRN: [id number] Date of Birth: [birth year]  Subjective/Objective:  Pt presented for Acute Respiratory Failure. Initiated on IV Lasix. Plan will be for SNF once stable- pt is agreeable. CSW is following.   Action/Plan: CM will continue to f/u until d/c.   Expected Discharge Date:                  Expected Discharge Plan:  [place]  In-House Referral:  Clinical Social Work  Discharge planning Services  CM Consult  Post Acute Care Choice:  NA Choice offered to:  NA  DME Arranged:  N/A DME Agency:  NA  HH Arranged:  NA HH Agency:  NA  Status of Service:  Completed, signed off  If discussed at [employer] of Stay Meetings, dates discussed:  06-23-16  Additional Comments: Plan for SNF @ [facility] 06-23-16. CSW assisting with disposition. No further needs at this time.  [name], RN 06/22/2016, 12:32 PM

## 2016-06-22 NOTE — Clinical Social Work Note (Signed)
Patient's daughter Elizabeth Pugh has chosen [facility] for the patient. CSW will facilitate DC when appropriate.   Liz Beach MSW, [place], [place], [phone]

## 2016-06-22 NOTE — Care Management Important Message (Signed)
Important Message  Patient Details  Name: Elizabeth Pugh MRN: [id number] Date of Birth: February 17, 1927   Medicare Important Message Given:  Yes    Nathen May 06/22/2016, 12:18 PM

## 2016-06-22 NOTE — Progress Notes (Signed)
PROGRESS NOTE                                                                                                    Patient Demographics:    Elizabeth Pugh, is a 83 y.o. female, DOB - 05/12/1926, [id number]  Admit date - 06/15/2016   Admitting Physician Rise Patience, MD  Outpatient Primary MD for the patient is Philis Fendt, MD  LOS - 6  Brief Narrative  83 y.o. female recently admitted for pneumonia, discharged 5 days prior to this admission was brought back to the ER after patient's family found pt lethargic and confused. Pt found to have PNA and hypercarbic respiratory failure with encephalopathy. She was placed on BiPAP and antibiotics with improvement.   Subjective:   More alert this am, denies dyspnea or chest pain, reports feeling better.    Assessment  & Plan :   Acute hypercapnic respiratory failure due acute on chronic combined CHF, perihilar pneumonia, unknown pathogen - pt with underlying sleep apnea as well in the setting of morbid obesity, OHS - initially required BiPAP and also needed it last night - better this AM with oxygen sat's at target range on 2 L oxygen via [place] - pt says she is oxygen dependent at baseline, 2 L - creatinine borderline normal, hold off on use of ACEi/ARB's for now - weight trend since admission, continue to monitor - continue Lasix 40 mg but change to PO today   Filed Weights   06/20/16 0500 06/21/16 0500 06/22/16 0433  Weight: (!) 137.1 kg (302 lb 3.2 oz) 136 kg (299 lb 12.8 oz) 123.8 kg (272 lb 14.4 oz)   Acute metabolic encephalopathy  - appears to be multifactorial secondary to the above - mental status improving overall - Continue to treat pneumonia, CHF - agrees with SNF placement   Community acquired pneumonia, perihilar, present on admission, unknown pathogen - Patient started on vancomycin and maxipime, vancomycin has been discontinued 06/16/2016 -  Patient currently on Levaquin, today is a day #7/7 of antibiotic treatment  UTI - yeast in urine, added fluconazole, today is day #5/7  Morbid obesity - Body mass index is 44.05 kg/m.   Acute kidney injury, hyperkalemia - In the setting of the above - potassium is now within normal limits, continue to avoid ACEI - Cr up this AM, encouraged PO intake, change lasix to PO - BMP in AM  Chronic Atrial fibrillation - [place] vasc 2 score of 8. Continue beta blocker - continue Eliquis   Bilat buttocks skin damage, present on admission and not pressure injury  - pt with elevated BMI and is difficult to turn; required 3 person assist - high risk for skin breakdown related to constant moisture and immobility - Bilat buttocks red and moist with patchy areas of loose peeling skin and red moist partial thickness skin loss; appearance consistent with moisture associated  skin damage - multiple areas of peau 'de orange skin changes, red, raised puckered areas to upper thighs, hips, buttocks; appearance consistent with chronic skin changes related to  lymphedema; weeping small amt yellow drainage. - WOC recommends Barrier cream to protect and repel moisture to bilat buttocks and sacrum. Bariatric air mattress to decrease pressure.   DM type II. Uncontrolled - Currently on sliding scale, added Lantus 15 U daily   Lab Results  Component Value Date   HGBA1C 9.4 (H) 06/09/2016   Pancytopenia - Partially reactive in the setting of acute illness, also please note that patient is on blood thinner - Hg down in the past 24 hours but no signs of active bleed  - CBC in AM  Diet : DIET SOFT Room service appropriate? Yes; Fluid consistency: Thin    Family Communication  :  No family at bedside this AM   Code Status :  No Intubation  Disposition Plan  :  SNF in AM if Hg stable   Consults  :  None  Procedures  :  None  DVT Prophylaxis  :  Eliquis  Lab Results  Component Value Date   PLT 149 (L)  06/22/2016   Inpatient Medications  Scheduled Meds: . apixaban  5 mg Oral BID  . chlorhexidine  15 mL Mouth Rinse BID  . fluconazole  100 mg Oral Daily  . furosemide  40 mg Intravenous BID  . insulin aspart  0-5 Units Subcutaneous QHS  . insulin aspart  0-9 Units Subcutaneous TID WC  . insulin glargine  15 Units Subcutaneous QHS  . levETIRAcetam  500 mg Oral BID  . mouth rinse  15 mL Mouth Rinse q12n4p  . metoprolol tartrate  12.5 mg Oral BID  . rOPINIRole  2 mg Oral TID   Continuous Infusions:  PRN Meds:.acetaminophen **OR** acetaminophen, ipratropium-albuterol, ondansetron **OR** ondansetron (ZOFRAN) IV  Antibiotics  :    Anti-infectives    Start     Dose/Rate Route Frequency Ordered Stop   06/18/16 1500  fluconazole (DIFLUCAN) tablet 100 mg     100 mg Oral Daily 06/18/16 1416     06/16/16 1200  levofloxacin (LEVAQUIN) tablet 500 mg     500 mg Oral Daily 06/16/16 0955 06/19/16 0823   06/16/16 0800  ceFEPIme (MAXIPIME) 1 g in dextrose 5 % 50 mL IVPB  Status:  Discontinued     1 g 100 mL/hr over 30 Minutes Intravenous Every 8 hours 06/16/16 0322 06/17/16 1158   06/16/16 0500  vancomycin (VANCOCIN) 1,750 mg in sodium chloride 0.9 % 500 mL IVPB  Status:  Discontinued     1,750 mg 250 mL/hr over 120 Minutes Intravenous Every 24 hours 06/16/16 0448 06/16/16 0955   06/16/16 0345  vancomycin (VANCOCIN) IVPB 1000 mg/200 mL premix  Status:  Discontinued     1,000 mg 200 mL/hr over 60 Minutes Intravenous  Once 06/16/16 0342 06/16/16 0447   06/16/16 0015  vancomycin (VANCOCIN) IVPB 1000 mg/200 mL premix     1,000 mg 200 mL/hr over 60 Minutes Intravenous  Once 06/16/16 0005 06/16/16 0257   06/16/16 0015  ceFEPIme (MAXIPIME) 2 g in dextrose 5 % 50 mL IVPB     2 g 100 mL/hr over 30 Minutes Intravenous  Once 06/16/16 0005 06/16/16 0118      Objective:   Vitals:   06/21/16 2053 06/22/16 0433 06/22/16 0830 06/22/16 1300  BP:  112/64  109/65  Pulse: (!) 44 82 95 85  Resp: 20 (!)  21   19  Temp:  98.1 F (36.7 C)  98.8 F (37.1 C)  TempSrc:  Oral  Oral  SpO2: 100% 97%  100%  Weight:  123.8 kg (272 lb 14.4 oz)    Height:        Wt Readings from Last 3 Encounters:  06/22/16 123.8 kg (272 lb 14.4 oz)  12/17/15 (!) 143.3 kg (316 lb)  10/14/15 127.5 kg (281 lb)     Intake/Output Summary (Last 24 hours) at 06/22/16 1559 Last data filed at 06/22/16 1449  Gross per 24 hour  Intake              720 ml  Output             1351 ml  Net             -631 ml   Physical Exam Constitutional: Appears well-developed. No distress.  CVS: RRR, S1/S2 +, no murmurs, no gallops, no carotid bruit, LE edema +2  Pulmonary: diminished breath sounds at bases, mild crackles noted  Abdominal: Soft. BS +,  no distension, tenderness, rebound or guarding.  Musculoskeletal: Normal range of motion.    Data Review:   CBC  Recent Labs Lab 06/15/16 2250 06/16/16 0418  06/18/16 0146 06/19/16 0254 06/20/16 0455 06/21/16 0344 06/22/16 0413  WBC 3.8* 3.1*  < > 2.7* 2.9* 3.9* 2.8* 4.1  HGB 10.7* 10.9*  < > 9.2* 10.0* 10.5* 10.5* 8.4*  HCT 35.1* 37.2  < > 30.4* 32.9* 34.6* 35.9* 35.1*  PLT 156 153  < > 161 157 155 132* 149*  MCV 99.4 100.3*  < > 97.1 96.5 97.7 99.4 98.9  MCH 30.3 29.4  < > 29.4 29.3 29.7 29.1 23.7*  MCHC 30.5 29.3*  < > 30.3 30.4 30.3 29.2* 23.9*  RDW 13.9 14.0  < > 14.0 14.3 14.3 14.2 13.9  LYMPHSABS 1.0 1.2  --   --   --   --   --   --   MONOABS 0.3 0.3  --   --   --   --   --   --   EOSABS 0.1 0.0  --   --   --   --   --   --   BASOSABS 0.0 0.0  --   --   --   --   --   --   < > = values in this interval not displayed.  Chemistries   Recent Labs Lab 06/15/16 2250 06/16/16 0418  06/18/16 0146 06/19/16 0254 06/20/16 0455 06/21/16 0344 06/22/16 0413  NA 140 139  < > 141 140 141 139 138  K 5.3* 5.7*  < > 3.6 3.6 4.0 3.6 3.6  CL 102 103  < > 101 97* 95* 94* 91*  CO2 33* 29  < > 34* 34* 36* 38* 39*  GLUCOSE 226* 185*  < > 312* 278* 312* 221* 271*  BUN  22* 22*  < > 20 16 16 12 18   CREATININE 1.15* 1.03*  < > 1.16* 1.15* 1.09* 1.04* 1.20*  CALCIUM 9.6 9.3  < > 8.5* 9.1 9.3 8.9 8.8*  AST 11* 16  --   --   --   --   --   --   ALT 10* 10*  --   --   --   --   --   --   ALKPHOS 45 43  --   --   --   --   --   --  BILITOT 0.9 0.9  --   --   --   --   --   --   < > = values in this interval not displayed. Lab Results  Component Value Date   HGBA1C 9.4 (H) 06/09/2016   Cardiac Enzymes  Recent Labs Lab 06/15/16 2250  TROPONINI <0.03   ------------------------------------------------------------------------------------------------------------------    Component Value Date/Time   BNP 233.2 (H) 06/15/2016 2250   Micro Results Recent Results (from the past 240 hour(s))  Blood Culture (routine x 2)     Status: None   Collection Time: 06/15/16 10:50 PM  Result Value Ref Range Status   Specimen Description BLOOD RIGHT HAND  Final   Special Requests IN PEDIATRIC BOTTLE 4ML  Final   Culture NO GROWTH 5 DAYS  Final   Report Status 06/21/2016 FINAL  Final  Blood Culture (routine x 2)     Status: None   Collection Time: 06/15/16 11:01 PM  Result Value Ref Range Status   Specimen Description BLOOD LEFT ARM  Final   Special Requests BOTTLES DRAWN AEROBIC AND ANAEROBIC 10ML  Final   Culture NO GROWTH 5 DAYS  Final   Report Status 06/21/2016 FINAL  Final  Urine culture     Status: Abnormal   Collection Time: 06/15/16 11:56 PM  Result Value Ref Range Status   Specimen Description URINE, RANDOM  Final   Special Requests NONE  Final   Culture >=100,000 COLONIES/mL YEAST (A)  Final   Report Status 06/18/2016 FINAL  Final  MRSA PCR Screening     Status: None   Collection Time: 06/16/16  4:12 AM  Result Value Ref Range Status   MRSA by PCR NEGATIVE NEGATIVE Final    Comment:        The GeneXpert MRSA Assay (FDA approved for NASAL specimens only), is one component of a comprehensive MRSA colonization surveillance program. It is  not intended to diagnose MRSA infection nor to guide or monitor treatment for MRSA infections.     Radiology Reports Dg Chest 2 View  Result Date: 06/09/2016 CLINICAL DATA:  Fever and cough EXAM: CHEST  2 VIEW COMPARISON:  December 11, 2015 FINDINGS: There is consolidation in the posterior left base region. Lungs elsewhere clear. Heart is mildly enlarged. The pulmonary vascularity has a pattern suggesting a degree of pulmonary arterial hypertension. No adenopathy evident. No bone lesions. IMPRESSION: Posterior left base consolidation consistent with pneumonia. Mild cardiomegaly. Suspect a degree of pulmonary arterial hypertension. Followup PA and lateral chest radiographs recommended in 3-4 weeks following trial of antibiotic therapy to ensure resolution and exclude underlying malignancy. Electronically Signed   By: Lowella Grip III M.D.   On: 06/09/2016 15:09   Ct Head Wo Contrast  Result Date: 06/16/2016 CLINICAL DATA:  Altered mental status EXAM: CT HEAD WITHOUT CONTRAST TECHNIQUE: Contiguous axial images were obtained from the base of the skull through the vertex without intravenous contrast. COMPARISON:  12/11/2015 CT FINDINGS: Brain: Mild sulcal prominence bilaterally consistent superficial atrophy. Minimal small vessel ischemic disease of periventricular white matter. No large vascular territory infarction, hemorrhage or midline shift. Left basal ganglial lacunar infarcts appear chronic. Fourth ventricle is midline. No extra-axial fluid collections. Vascular: Moderate atherosclerosis of the carotid siphons. No hyperdense vessels. Skull: Normal. Negative for fracture or focal lesion. Osteopenic appearance of the bony calvarium. Sinuses/Orbits: Inferior mastoid air cells are partially opacified consistent with a mastoid effusion. Minimal ethmoid sinus mucosal thickening. The visualized sphenoid and maxillary sinuses as well as the frontal  sinus appear clear. Other: None IMPRESSION: No acute  intracranial abnormality. Chronic small vessel ischemic disease and left-sided basal ganglial lacunar infarcts. Chronic small mastoid effusion on the right. Moderate atherosclerosis of carotid siphons Electronically Signed   By: Ashley Royalty M.D.   On: 06/16/2016 00:25   Dg Chest Port 1 View  Result Date: 06/15/2016 CLINICAL DATA:  Lethargic tonight. Discharge from hospital for days ago with urinary tract infection and pneumonia. EXAM: PORTABLE CHEST 1 VIEW COMPARISON:  06/09/2016 FINDINGS: Shallow inspiration. Cardiac enlargement with mild pulmonary vascular congestion. Bilateral perihilar infiltrates are progressing since previous study and may indicate edema or progressing pneumonia. Small bilateral pleural effusions. Calcified and tortuous aorta. No pneumothorax. IMPRESSION: Progression of perihilar infiltrates may represent developing edema or progressing pneumonia. Small pleural effusions. Electronically Signed   By: Lucienne Capers M.D.   On: 06/15/2016 23:19   Time Spent in minutes  30  MAGICK-Elizabeth Pugh M.D on 06/22/2016 at 3:59 PM  Between 7am to 7pm - Pager - [phone]  After 7pm go to [email or web address] - password [facility]  [facility] -  Office  [phone]

## 2016-06-22 NOTE — Progress Notes (Signed)
Inpatient Diabetes Program Recommendations  AACE/ADA: New Consensus Statement on Inpatient Glycemic Control (2015)  Target Ranges:  Prepandial:   less than 140 mg/dL      Peak postprandial:   less than 180 mg/dL (1-2 hours)      Critically ill patients:  140 - 180 mg/dL   Results for ACASIA, HELM (MRN [id number]) as of 06/22/2016 10:09  Ref. Range 06/21/2016 07:49 06/21/2016 11:44 06/21/2016 16:34 06/21/2016 22:26 06/22/2016 07:35  Glucose-Capillary Latest Ref Range: 65 - 99 mg/dL 207 (H) 259 (H) 229 (H) 232 (H) 244 (H)   Review of Glycemic Control  Diabetes history: DM2 Outpatient Diabetes medications: Lantus 30 units hs + Novolog 0-12 units for CBG > 150 Current orders for Inpatient glycemic control: Lantus 15 units  Inpatient Diabetes Program Recommendations:   Glucose consistently in the 200's. Patient takes more basal insulin at home. Please consider increasing Lantus to 20 units.  Thanks,  Tama Headings RN, MSN, [facility] Inpatient Diabetes Coordinator Team Pager [phone] (8a-5p)

## 2016-06-22 NOTE — NC FL2 (Signed)
[place] LEVEL OF CARE SCREENING TOOL     IDENTIFICATION  Patient Name: Elizabeth Pugh Birthdate: 1926/07/01 Sex: female Admission Date (Current Location): 06/15/2016  [facility] and [place] Number:  Herbalist and Address:  [facility], [place] [address] [place], [place] [address]      Provider Number: [id number]  Attending Physician Name and Address:  Theodis Blaze, MD  Relative Name and Phone Number:  Dtr [phone]    Current Level of Care: Hospital Recommended Level of Care: [place] Prior Approval Number:    Date Approved/Denied:   PASRR Number: [id number]  Discharge Plan: SNF    Current Diagnoses: Patient Active Problem List   Diagnosis Date Noted  . Acute respiratory failure with hypoxia and hypercapnia ([place]) 06/16/2016  . Urinary tract infection without hematuria 06/16/2016  . HCAP (healthcare-associated pneumonia) 06/16/2016  . Acute respiratory failure ([place]) 06/16/2016  . CAP (community acquired pneumonia) 06/09/2016  . Cellulitis 12/16/2015  . Confusion 12/11/2015  . SIRS (systemic inflammatory response syndrome) ([place]) 12/11/2015  . Chronic respiratory failure with hypoxia and hypercapnia ([place]) 09/28/2015  . Acute encephalopathy   . Acute on chronic congestive heart failure ([place])   . Needs sleep apnea assessment   . Acute on chronic diastolic (congestive) heart failure 09/22/2015  . Acute kidney injury ([place]) 09/02/2015  . Lobar pneumonia due to unspecified organism 09/02/2015  . Bilateral leg edema   . Acute respiratory failure with hypoxia ([place]) 08/29/2015  . Acute bronchitis 08/29/2015  . Acute hyperkalemia 08/29/2015  . History of pulmonary embolism 01/27/2015  . Chest pain at rest 01/27/2015  . Shortness of breath at rest 01/27/2015  . Chronic diastolic heart failure ([place]) 01/27/2015  . Thrombocytopenia ([place]) 01/27/2015  . Abnormal findings on radiological examination of  gastrointestinal tract 01/27/2015  . Chronic atrial fibrillation ([place])   . Seizure disorder ([place])   . Anxiety   . Anemia   . Restless leg syndrome   . Acute delirium 04/12/2011  . DM (diabetes mellitus), type 2, uncontrolled with complications ([place]) 0000000  . CVA, old, hemiparesis ([place]) 03/25/2011  . Hypertension 03/25/2011    Orientation RESPIRATION BLADDER Height & Weight     Self, Time, Situation, Place  (S) Normal, O2 (3 ltrs) Incontinent Weight: 123.8 kg (272 lb 14.4 oz) Height:  5\' 6"  (167.6 cm)  BEHAVIORAL SYMPTOMS/MOOD NEUROLOGICAL BOWEL NUTRITION STATUS      Incontinent Diet (See DC Summary)  AMBULATORY STATUS COMMUNICATION OF NEEDS Skin   Limited Assist Verbally Normal                       Personal Care Assistance Level of Assistance  Bathing Bathing Assistance: Limited assistance   Dressing Assistance: Limited assistance     Functional Limitations Info  Speech, Hearing, Sight Sight Info: Adequate Hearing Info: Adequate Speech Info: Adequate    SPECIAL CARE FACTORS FREQUENCY  PT (By licensed PT), OT (By licensed OT)     PT Frequency: 5x wk OT Frequency: 5x wk            Contractures Contractures Info: Not present    Additional Factors Info  Code Status, Allergies Code Status Info: Partial Code Allergies Info: Erythromycin, Penicillins, Zithromax Azithromycin           Current Medications (06/22/2016):  This is the current hospital active medication list Current Facility-Administered Medications  Medication Dose Route Frequency Provider Last Rate Last Dose  .  acetaminophen (TYLENOL) tablet 650 mg  650 mg Oral Q6H PRN Rise Patience, MD   650 mg at 06/17/16 1958   Or  . acetaminophen (TYLENOL) suppository 650 mg  650 mg Rectal Q6H PRN Rise Patience, MD      . apixaban Arne [place]) tablet 5 mg  5 mg Oral BID Kris Mouton, RPH   5 mg at 06/22/16 0831  . chlorhexidine (PERIDEX) 0.12 % solution 15 mL  15 mL Mouth Rinse BID Rise Patience, MD   15 mL at 06/22/16 0830  . fluconazole (DIFLUCAN) tablet 100 mg  100 mg Oral Daily Theodis Blaze, MD   100 mg at 06/22/16 0835  . furosemide (LASIX) injection 40 mg  40 mg Intravenous BID Thurnell Lose, MD   40 mg at 06/22/16 0830  . insulin aspart (novoLOG) injection 0-5 Units  0-5 Units Subcutaneous QHS Theodis Blaze, MD   2 Units at 06/21/16 2300  . insulin aspart (novoLOG) injection 0-9 Units  0-9 Units Subcutaneous TID WC Theodis Blaze, MD   3 Units at 06/22/16 0830  . insulin glargine (LANTUS) injection 15 Units  15 Units Subcutaneous QHS Theodis Blaze, MD   15 Units at 06/21/16 2250  . ipratropium-albuterol (DUONEB) 0.5-2.5 (3) MG/3ML nebulizer solution 3 mL  3 mL Nebulization Q6H PRN Rise Patience, MD      . levETIRAcetam (KEPPRA) tablet 500 mg  500 mg Oral BID Kris Mouton, RPH   500 mg at 06/22/16 0830  . MEDLINE mouth rinse  15 mL Mouth Rinse q12n4p Rise Patience, MD   15 mL at 06/21/16 1600  . metoprolol tartrate (LOPRESSOR) tablet 12.5 mg  12.5 mg Oral BID Theodis Blaze, MD   12.5 mg at 06/22/16 0830  . ondansetron (ZOFRAN) tablet 4 mg  4 mg Oral Q6H PRN Rise Patience, MD       Or  . ondansetron [facility]) injection 4 mg  4 mg Intravenous Q6H PRN Rise Patience, MD      . rOPINIRole (REQUIP) tablet 2 mg  2 mg Oral TID Theodis Blaze, MD   2 mg at 06/22/16 0831     Discharge Medications: Please see discharge summary for a list of discharge medications.  Relevant Imaging Results:  Relevant Lab Results:   Additional Information [phone] Patient requires Bi-PAP at night.  Rigoberto Noel, LCSW

## 2016-06-23 ENCOUNTER — Ambulatory Visit: Payer: Self-pay | Admitting: *Deleted

## 2016-06-23 LAB — CBC
HEMATOCRIT: 35.3 % — AB (ref 36.0–46.0)
HEMOGLOBIN: 10.5 g/dL — AB (ref 12.0–15.0)
MCH: 29.3 pg (ref 26.0–34.0)
MCHC: 29.7 g/dL — AB (ref 30.0–36.0)
MCV: 98.6 fL (ref 78.0–100.0)
Platelets: 152 10*3/uL (ref 150–400)
RBC: 3.58 MIL/uL — ABNORMAL LOW (ref 3.87–5.11)
RDW: 14 % (ref 11.5–15.5)
WBC: 3.6 10*3/uL — ABNORMAL LOW (ref 4.0–10.5)

## 2016-06-23 LAB — BASIC METABOLIC PANEL
Anion gap: 7 (ref 5–15)
BUN: 17 mg/dL (ref 6–20)
CALCIUM: 9.1 mg/dL (ref 8.9–10.3)
CHLORIDE: 91 mmol/L — AB (ref 101–111)
CO2: 42 mmol/L — AB (ref 22–32)
CREATININE: 1.12 mg/dL — AB (ref 0.44–1.00)
GFR calc Af Amer: 49 mL/min — ABNORMAL LOW (ref >60)
GFR calc non Af Amer: 43 mL/min — ABNORMAL LOW (ref >60)
Glucose, Bld: 167 mg/dL — ABNORMAL HIGH (ref 65–99)
Potassium: 3.3 mmol/L — ABNORMAL LOW (ref 3.5–5.1)
SODIUM: 140 mmol/L (ref 135–145)

## 2016-06-23 LAB — GLUCOSE, CAPILLARY
Glucose-Capillary: 158 mg/dL — ABNORMAL HIGH (ref 65–99)
Glucose-Capillary: 280 mg/dL — ABNORMAL HIGH (ref 65–99)
Glucose-Capillary: 260 mg/dL — ABNORMAL HIGH (ref 65–99)

## 2016-06-23 MED ORDER — FLUCONAZOLE 100 MG PO TABS: 100.0000 mg | ORAL_TABLET | Freq: Every day | ORAL | 0 refills | Status: DC

## 2016-06-23 MED ORDER — POTASSIUM CHLORIDE CRYS ER 20 MEQ PO TBCR
40.0000 meq | EXTENDED_RELEASE_TABLET | Freq: Once | ORAL | Status: AC
  Administered 2016-06-23: 40 meq via ORAL
  Filled 2016-06-23: qty 2

## 2016-06-23 MED ORDER — INSULIN GLARGINE 100 UNIT/ML ~~LOC~~ SOLN: 20.0000 [IU] | Freq: Every day | SUBCUTANEOUS | 0 refills | Status: DC

## 2016-06-23 MED ORDER — METOPROLOL TARTRATE 25 MG PO TABS: 12.5000 mg | ORAL_TABLET | Freq: Two times a day (BID) | ORAL | 0 refills | Status: AC

## 2016-06-23 NOTE — NC FL2 (Signed)
[place] LEVEL OF CARE SCREENING TOOL     IDENTIFICATION  Patient Name: Elizabeth Pugh Birthdate: [birth year] Sex: female Admission Date (Current Location): 06/15/2016  [facility] and [place] Number:  Herbalist and Address:  The . [facility], [place] [address] [place], [place] [address]      Provider Number: [id number]  Attending Physician Name and Address:  Theodis Blaze, MD  Relative Name and Phone Number:  [phone] [phone]    Current Level of Care: Hospital Recommended Level of Care: [place] Prior Approval Number:    Date Approved/Denied:   PASRR Number: [id number]  Discharge Plan: SNF    Current Diagnoses: Patient Active Problem List   Diagnosis Date Noted  . Acute respiratory failure with hypoxia and hypercapnia ([place]) 06/16/2016  . Urinary tract infection without hematuria 06/16/2016  . HCAP (healthcare-associated pneumonia) 06/16/2016  . Acute respiratory failure ([place]) 06/16/2016  . CAP (community acquired pneumonia) 06/09/2016  . Cellulitis 12/16/2015  . Confusion 12/11/2015  . SIRS (systemic inflammatory response syndrome) ([place]) 12/11/2015  . Chronic respiratory failure with hypoxia and hypercapnia ([place]) 09/28/2015  . Acute encephalopathy   . Acute on chronic congestive heart failure ([place])   . Needs sleep apnea assessment   . Acute on chronic diastolic (congestive) heart failure 09/22/2015  . Acute kidney injury ([place]) 09/02/2015  . Lobar pneumonia due to unspecified organism 09/02/2015  . Bilateral leg edema   . Acute respiratory failure with hypoxia ([place]) 08/29/2015  . Acute bronchitis 08/29/2015  . Acute hyperkalemia 08/29/2015  . History of pulmonary embolism 01/27/2015  . Chest pain at rest 01/27/2015  . Shortness of breath at rest 01/27/2015  . Chronic diastolic heart failure ([place]) 01/27/2015  . Thrombocytopenia ([place]) 01/27/2015  . Abnormal findings on radiological examination of  gastrointestinal tract 01/27/2015  . Chronic atrial fibrillation ([place])   . Seizure disorder ([place])   . Anxiety   . Anemia   . Restless leg syndrome   . Acute delirium 04/12/2011  . DM (diabetes mellitus), type 2, uncontrolled with complications ([place]) 0000000  . CVA, old, hemiparesis ([place]) 03/25/2011  . Hypertension 03/25/2011    Orientation RESPIRATION BLADDER Height & Weight     Self, Time, Situation, Place  O2 (3L O2. Patient on bipap at night - Bipap settings: IPAP=12 and EPAP=6 ) Incontinent Weight: 124.9 kg (275 lb 6.4 oz) Height:  5\' 6"  (167.6 cm)  BEHAVIORAL SYMPTOMS/MOOD NEUROLOGICAL BOWEL NUTRITION STATUS      Incontinent Diet (See DC Summary)  AMBULATORY STATUS COMMUNICATION OF NEEDS Skin   Limited Assist Verbally Normal                       Personal Care Assistance Level of Assistance  Bathing Bathing Assistance: Limited assistance   Dressing Assistance: Limited assistance     Functional Limitations Info  Speech, Hearing, Sight Sight Info: Adequate Hearing Info: Adequate Speech Info: Adequate    SPECIAL CARE FACTORS FREQUENCY  PT (By licensed PT), OT (By licensed OT)     PT Frequency: 5x wk OT Frequency: 5x wk            Contractures Contractures Info: Not present    Additional Factors Info  Code Status, Allergies Code Status Info: Partial Code Allergies Info: Erythromycin, Penicillins, Zithromax Azithromycin           Current Medications (06/23/2016):  This is the current hospital active medication list Current Facility-Administered Medications  Medication Dose Route Frequency Provider Last Rate Last Dose  . acetaminophen (TYLENOL) tablet 650 mg  650 mg Oral Q6H PRN Rise Patience, MD   650 mg at 06/17/16 1958   Or  . acetaminophen (TYLENOL) suppository 650 mg  650 mg Rectal Q6H PRN Rise Patience, MD      . apixaban Arne [place]) tablet 5 mg  5 mg Oral BID Kris Mouton, RPH   5 mg at 06/23/16 0801  . chlorhexidine (PERIDEX)  0.12 % solution 15 mL  15 mL Mouth Rinse BID Rise Patience, MD   15 mL at 06/23/16 0802  . diphenhydrAMINE (BENADRYL) capsule 25 mg  25 mg Oral Q4H PRN Rhetta Mura Schorr, NP      . fluconazole (DIFLUCAN) tablet 100 mg  100 mg Oral Daily Theodis Blaze, MD   100 mg at 06/23/16 0801  . furosemide (LASIX) tablet 40 mg  40 mg Oral BID Theodis Blaze, MD   40 mg at 06/23/16 0802  . insulin aspart (novoLOG) injection 0-5 Units  0-5 Units Subcutaneous QHS Theodis Blaze, MD   4 Units at 06/22/16 2123  . insulin aspart (novoLOG) injection 0-9 Units  0-9 Units Subcutaneous TID WC Theodis Blaze, MD   2 Units at 06/23/16 0800  . insulin glargine (LANTUS) injection 20 Units  20 Units Subcutaneous QHS Theodis Blaze, MD   20 Units at 06/22/16 2124  . ipratropium-albuterol (DUONEB) 0.5-2.5 (3) MG/3ML nebulizer solution 3 mL  3 mL Nebulization Q6H PRN Rise Patience, MD      . levETIRAcetam (KEPPRA) tablet 500 mg  500 mg Oral BID Kris Mouton, RPH   500 mg at 06/23/16 0801  . MEDLINE mouth rinse  15 mL Mouth Rinse q12n4p Rise Patience, MD   15 mL at 06/21/16 1600  . metoprolol tartrate (LOPRESSOR) tablet 12.5 mg  12.5 mg Oral BID Theodis Blaze, MD   12.5 mg at 06/23/16 0801  . ondansetron (ZOFRAN) tablet 4 mg  4 mg Oral Q6H PRN Rise Patience, MD       Or  . ondansetron [facility]) injection 4 mg  4 mg Intravenous Q6H PRN Rise Patience, MD      . rOPINIRole (REQUIP) tablet 2 mg  2 mg Oral TID Theodis Blaze, MD   2 mg at 06/23/16 0801     Discharge Medications: Please see discharge summary for a list of discharge medications.  Relevant Imaging Results:  Relevant Lab Results:   Additional Information [phone] Patient requires Bi-PAP at night.  Rigoberto Noel, LCSW

## 2016-06-23 NOTE — Discharge Instructions (Signed)
Community-Acquired Pneumonia, Adult °Introduction °Pneumonia is an infection of the lungs. One type of pneumonia can happen while a person is in a hospital. A different type can happen when a person is not in a hospital (community-acquired pneumonia). It is easy for this kind to spread from person to person. It can spread to you if you breathe near an infected person who coughs or sneezes. Some symptoms include: °· A dry cough. °· A wet (productive) cough. °· Fever. °· Sweating. °· Chest pain. °Follow these instructions at home: °· Take over-the-counter and prescription medicines only as told by your doctor. °¨ Only take cough medicine if you are losing sleep. °¨ If you were prescribed an antibiotic medicine, take it as told by your doctor. Do not stop taking the antibiotic even if you start to feel better. °· Sleep with your head and neck raised (elevated). You can do this by putting a few pillows under your head, or you can sleep in a recliner. °· Do not use tobacco products. These include cigarettes, chewing tobacco, and e-cigarettes. If you need help quitting, ask your doctor. °· Drink enough water to keep your pee (urine) clear or pale yellow. °A shot (vaccine) can help prevent pneumonia. Shots are often suggested for: °· People older than 81 years of age. °· People older than 81 years of age: °¨ Who are having cancer treatment. °¨ Who have long-term (chronic) lung disease. °¨ Who have problems with their body's defense system (immune system). °You may also prevent pneumonia if you take these actions: °· Get the flu (influenza) shot every year. °· Go to the dentist as often as told. °· Wash your hands often. If soap and water are not available, use hand sanitizer. °Contact a doctor if: °· You have a fever. °· You lose sleep because your cough medicine does not help. °Get help right away if: °· You are short of breath and it gets worse. °· You have more chest pain. °· Your sickness gets worse. This is very  serious if: °¨ You are an older adult. °¨ Your body's defense system is weak. °· You cough up blood. °This information is not intended to replace advice given to you by your health care provider. Make sure you discuss any questions you have with your health care provider. °Document Released: 10/07/2007 Document Revised: 09/26/2015 Document Reviewed: 08/15/2014 °© 2017 [employer]vier ° °

## 2016-06-23 NOTE — Progress Notes (Signed)
Report received in patient's room via [place] using [employer], reviewed orders, labs, VS, meds and patient/s general condition, assumed care of patient.

## 2016-06-23 NOTE — Clinical Social Work Placement (Signed)
   CLINICAL SOCIAL WORK PLACEMENT  NOTE  Date:  06/23/2016  Patient Details  Name: Elizabeth Pugh MRN: [id number] Date of Birth: 08-16-1926  Clinical Social Work is seeking post-discharge placement for this patient at the [place] level of care (*CSW will initial, date and re-position this form in  chart as items are completed):  Yes   Patient/family provided with [place] Work Department's list of facilities offering this level of care within the geographic area requested by the patient (or if unable, by the patient's family).  Yes   Patient/family informed of their freedom to choose among providers that offer the needed level of care, that participate in Medicare, Medicaid or managed care program needed by the patient, have an available bed and are willing to accept the patient.  Yes   Patient/family informed of [facility]'s ownership interest in [facility] and [facility], as well as of the fact that they are under no obligation to receive care at these facilities.  PASRR submitted to EDS on       PASRR number received on       Existing PASRR number confirmed on 06/21/16     FL2 transmitted to all facilities in geographic area requested by pt/family on 06/21/16     FL2 transmitted to all facilities within larger geographic area on       Patient informed that his/her managed care company has contracts with or will negotiate with certain facilities, including the following:        Yes   Patient/family informed of bed offers received.  Patient chooses bed at [facility]     Physician recommends and patient chooses bed at      Patient to be transferred to [facility] on 06/23/16.  Patient to be transferred to facility by Ambulance     Patient family notified on 06/23/16 of transfer.  Name of family member notified:  [name] and [place] Please sign FL2, Please prepare priority discharge summary, including  medications, Please prepare prescriptions     Additional Comment:  Per MD patient ready for DC to [facility]. RN, patient, patient's family, and facility notified of DC. RN given number for report. DC packet on chart. [place] with facility confirms that the facility has bipap for the patient, settings added to [facility] and relayed to [place]. Ambulance transport requested for patient. CSW signing off.   _______________________________________________ Rigoberto Noel, LCSW 06/23/2016, 2:34 PM

## 2016-06-23 NOTE — Discharge Summary (Signed)
Physician Discharge Summary  Elizabeth Pugh [id number] DOB: 05/03/1927 DOA: 06/15/2016  PCP: Philis Fendt, MD  Admit date: 06/15/2016 Discharge date: 06/23/2016  Recommendations for Outpatient Follow-up:  1. Pt will need to follow up with PCP in 1 week post discharge 2. Please obtain BMP to evaluate electrolytes and kidney function, K level 3. K was supplemented prior to discharge so please follow up and supplement as indicated  4. Please also check CBC to evaluate Hg and Hct levels, WBC and Plt count  5. Pt to complete treatment with Fluconazole for 2 more days  6. Please note that benzo was held as pt was somewhat somnolent but now she is more alert and benzo as needed can be considered based on clinical course  7. Pt on lasix 40 mg IV BID, weight on discharge 272 lbs, monitor at least every other day   Discharge Diagnoses:  Active Problems:   DM (diabetes mellitus), type 2, uncontrolled with complications (HCC)   CVA, old, hemiparesis ([place])   Chronic atrial fibrillation (HCC)   Chronic diastolic heart failure (HCC)   Acute encephalopathy   Acute respiratory failure with hypoxia and hypercapnia (HCC)   Urinary tract infection without hematuria   HCAP (healthcare-associated pneumonia)   Acute respiratory failure ([place])  Discharge Condition: Stable  Diet recommendation: Soft diet   Brief Narrative  83 y.o.femalerecently admitted for pneumonia, discharged 5 days prior to this admission was brought back to the ER after patient's family found pt lethargic and confused. Pt found to have PNA and hypercarbic respiratory failure with encephalopathy. She was placed on BiPAP and antibiotics with improvement.   Subjective:   More alert this am, denies dyspnea or chest pain, reports feeling better.    Assessment  & Plan :   Acute hypercapnic respiratory failure due acute on chronic combined CHF, perihilar pneumonia, unknown pathogen - pt with underlying sleep apnea as well in  the setting of morbid obesity, OHS - initially required BiPAP and also needed it last night - better this AM with oxygen sat's at target range on 2 L oxygen via [place] - pt says she is oxygen dependent at baseline, 2 L - creatinine borderline normal, hold off on use of ACEi/ARB's for now - weight trend since admission, continue to monitor - continue Lasix 40 mg BID PO on discharge        Filed Weights   06/20/16 0500 06/21/16 0500 06/22/16 0433  Weight: (!) 137.1 kg (302 lb 3.2 oz) 136 kg (299 lb 12.8 oz) 123.8 kg (272 lb 14.4 oz)   Acute metabolic encephalopathy  - appears to be multifactorial secondary to the above - mental status improving overall - agrees with SNF placement   Community acquired pneumonia, perihilar, present on admission, unknown pathogen - Patient started on vancomycin and maxipime, vancomycin has been discontinued 06/16/2016 - Patient currently completed therapy with Levaquin   UTI - yeast in urine, added fluconazole, pt to complete two more days of therapy   Morbid obesity - Body mass index is 44.05 kg/m.   Acute kidney injury, hyperkalemia - In the setting of the above - continue to avoid ACEI - K is on low side this AM, supplemented prior to discharge but avoiding scheduled supplementation due to propensity towards hyperkalemia  - monitor K level and supplement as needed  - Cr overall trending down   Chronic Atrial fibrillation - [place] vasc 2 score of 8. Continue beta blocker - continue Eliquis   Bilat buttocks skin  damage, present on admission and not pressure injury  - pt with elevatedBMI and is difficult to turn; required 3 person assist - high risk for skin breakdown related to constant moisture and immobility - Bilat buttocks red and moist with patchy areas of loose peeling skin and red moist partial thickness skin loss; appearance consistent with moisture associated skin damage - multiple areas of peau 'de orange skin changes, red, raised  puckered areas to upper thighs, hips, buttocks; appearance consistent with chronic skin changes related to lymphedema; weeping small amt yellow drainage. - WOC recommends Barrier cream to protect and repel moisture to bilat buttocks and sacrum. Bariatric air mattress to decrease pressure.   DM type II. Uncontrolled - Currently on sliding scale, added Lantus 15 U daily   Recent Labs       Lab Results  Component Value Date   HGBA1C 9.4 (H) 06/09/2016     Pancytopenia - Partially reactive in the setting of acute illness, also please note that patient is on blood thinner - Hg and Plt counts overall stable, no signs of active bleeding    Family Communication  :  No family at bedside this AM,daughter updated over the phone   Code Status :  No Intubation  Disposition Plan  :  SNF   Consults  :  None  Procedures  :  None  DVT Prophylaxis  :  Eliquis  Procedures/Studies: Dg Chest 2 View  Result Date: 06/09/2016 CLINICAL DATA:  Fever and cough EXAM: CHEST  2 VIEW COMPARISON:  December 11, 2015 FINDINGS: There is consolidation in the posterior left base region. Lungs elsewhere clear. Heart is mildly enlarged. The pulmonary vascularity has a pattern suggesting a degree of pulmonary arterial hypertension. No adenopathy evident. No bone lesions. IMPRESSION: Posterior left base consolidation consistent with pneumonia. Mild cardiomegaly. Suspect a degree of pulmonary arterial hypertension. Followup PA and lateral chest radiographs recommended in 3-4 weeks following trial of antibiotic therapy to ensure resolution and exclude underlying malignancy. Electronically Signed   By: Lowella Grip III M.D.   On: 06/09/2016 15:09   Ct Head Wo Contrast  Result Date: 06/16/2016 CLINICAL DATA:  Altered mental status EXAM: CT HEAD WITHOUT CONTRAST TECHNIQUE: Contiguous axial images were obtained from the base of the skull through the vertex without intravenous contrast. COMPARISON:  12/11/2015 CT  FINDINGS: Brain: Mild sulcal prominence bilaterally consistent superficial atrophy. Minimal small vessel ischemic disease of periventricular white matter. No large vascular territory infarction, hemorrhage or midline shift. Left basal ganglial lacunar infarcts appear chronic. Fourth ventricle is midline. No extra-axial fluid collections. Vascular: Moderate atherosclerosis of the carotid siphons. No hyperdense vessels. Skull: Normal. Negative for fracture or focal lesion. Osteopenic appearance of the bony calvarium. Sinuses/Orbits: Inferior mastoid air cells are partially opacified consistent with a mastoid effusion. Minimal ethmoid sinus mucosal thickening. The visualized sphenoid and maxillary sinuses as well as the frontal sinus appear clear. Other: None IMPRESSION: No acute intracranial abnormality. Chronic small vessel ischemic disease and left-sided basal ganglial lacunar infarcts. Chronic small mastoid effusion on the right. Moderate atherosclerosis of carotid siphons Electronically Signed   By: Ashley Royalty M.D.   On: 06/16/2016 00:25   Dg Chest Port 1 View  Result Date: 06/15/2016 CLINICAL DATA:  Lethargic tonight. Discharge from hospital for days ago with urinary tract infection and pneumonia. EXAM: PORTABLE CHEST 1 VIEW COMPARISON:  06/09/2016 FINDINGS: Shallow inspiration. Cardiac enlargement with mild pulmonary vascular congestion. Bilateral perihilar infiltrates are progressing since previous study and may indicate  edema or progressing pneumonia. Small bilateral pleural effusions. Calcified and tortuous aorta. No pneumothorax. IMPRESSION: Progression of perihilar infiltrates may represent developing edema or progressing pneumonia. Small pleural effusions. Electronically Signed   By: Lucienne Capers M.D.   On: 06/15/2016 23:19     Discharge Exam: Vitals:   06/23/16 0728 06/23/16 1106  BP: (!) 104/56 110/61  Pulse: 76 99  Resp: 16 14  Temp: 97.7 F (36.5 C) 97.6 F (36.4 C)   Vitals:    06/23/16 0500 06/23/16 0612 06/23/16 0728 06/23/16 1106  BP: 117/81  (!) 104/56 110/61  Pulse: 79  76 99  Resp: (!) 21  16 14   Temp: 98.1 F (36.7 C)  97.7 F (36.5 C) 97.6 F (36.4 C)  TempSrc: Oral  Oral Oral  SpO2: 100%  100% 99%  Weight:  124.9 kg (275 lb 6.4 oz)    Height:        General: Pt is alert, follows commands appropriately, not in acute distress Cardiovascular: Regular rate and rhythm, no rubs, no gallops Respiratory: Clear to auscultation bilaterally, diminished breath sounds at bases  Abdominal: Soft, non tender, non distended, bowel sounds +, no guarding Extremities: +1 bilateral LE pitting edema, chronic venous stasis changes  Neuro: Grossly nonfocal  Discharge Instructions  Discharge Instructions    Diet - low sodium heart healthy    Complete by:  As directed    Increase activity slowly    Complete by:  As directed      Allergies as of 06/23/2016      Reactions   Erythromycin Other (See Comments)   Vaginal itching   Penicillins Rash, Other (See Comments)   Has patient had a PCN reaction causing immediate rash, facial/tongue/throat swelling, SOB or lightheadedness with hypotension: No Has patient had a PCN reaction causing severe rash involving mucus membranes or skin necrosis: No Has patient had a PCN reaction that required hospitalization: No Has patient had a PCN reaction occurring within the last 10 years: No If all of the above answers are "NO", then may proceed with Cephalosporin use.   Zithromax [azithromycin] Other (See Comments)   gave her a yeast infection.      Medication List    STOP taking these medications   ALPRAZolam 0.25 MG tablet Commonly known as:  XANAX   carvedilol 3.125 MG tablet Commonly known as:  COREG   cefdinir 300 MG capsule Commonly known as:  OMNICEF     TAKE these medications   albuterol 108 (90 Base) MCG/ACT inhaler Commonly known as:  PROVENTIL HFA;VENTOLIN HFA Inhale 1 puff into the lungs every 6 (six)  hours as needed for wheezing or shortness of breath.   ALLERGY EYE OP Place 1 drop into both eyes as needed (For eye irritation.).   apixaban 5 MG Tabs tablet Commonly known as:  ELIQUIS Take 1 tablet (5 mg total) by mouth 2 (two) times daily.   CALCIUM 500+D 500-400 MG-UNIT tablet Generic drug:  calcium-vitamin D Take 1 tablet by mouth 2 (two) times daily.   ferrous sulfate 325 (65 FE) MG tablet Take 325 mg by mouth daily with supper.   fluconazole 100 MG tablet Commonly known as:  DIFLUCAN Take 1 tablet (100 mg total) by mouth daily. Start taking on:  06/24/2016   furosemide 40 MG tablet Commonly known as:  LASIX Take 40 mg by mouth 2 (two) times daily.   guaiFENesin 600 MG 12 hr tablet Commonly known as:  MUCINEX Take 2 tablets (1,200 mg  total) by mouth 2 (two) times daily.   hydrOXYzine 25 MG tablet Commonly known as:  ATARAX/VISTARIL Take 25 mg by mouth every 8 (eight) hours as needed for itching.   insulin aspart 100 UNIT/ML injection Commonly known as:  novoLOG Inject 0-12 Units into the skin as needed for high blood sugar. Give if blood sugar is greater than 150.   insulin glargine 100 UNIT/ML injection Commonly known as:  LANTUS Inject 0.2 mLs (20 Units total) into the skin at bedtime. What changed:  how much to take   ipratropium-albuterol 0.5-2.5 (3) MG/3ML Soln Commonly known as:  DUONEB Take 3 mLs by nebulization every 6 (six) hours as needed. What changed:  reasons to take this   levETIRAcetam 500 MG tablet Commonly known as:  KEPPRA Take 1 tablet (500 mg total) by mouth 2 (two) times daily.   metoprolol tartrate 25 MG tablet Commonly known as:  LOPRESSOR Take 0.5 tablets (12.5 mg total) by mouth 2 (two) times daily.   pantoprazole 40 MG tablet Commonly known as:  PROTONIX Take 1 tablet (40 mg total) by mouth 2 (two) times daily before a meal.   polyethylene glycol packet Commonly known as:  MIRALAX / GLYCOLAX Take 17 g by mouth daily as  needed for mild constipation.   pregabalin 75 MG capsule Commonly known as:  LYRICA Take 75 mg by mouth at bedtime.   rOPINIRole 2 MG tablet Commonly known as:  REQUIP Take 1 tablet (2 mg total) by mouth 3 (three) times daily.   STOOL SOFTENER PO Take 1 capsule by mouth daily as needed (For constipation.).   Vitamin D (Ergocalciferol) 50000 units Caps capsule Commonly known as:  DRISDOL Take 50,000 Units by mouth every Saturday. No specific day   VOLTAREN 1 % Gel Generic drug:  diclofenac sodium Apply 1 application topically 4 (four) times daily as needed (For knee pain.).            Durable Medical Equipment        Start     Ordered   06/18/16 1354  For home use only DME Air overlay mattress  Once     06/18/16 1353     Follow-up Information    Philis Fendt, MD Follow up.   Specialty:  Internal Medicine Contact information: [address] [place] [address] [phone]            The results of significant diagnostics from this hospitalization (including imaging, microbiology, ancillary and laboratory) are listed below for reference.     Microbiology: Recent Results (from the past 240 hour(s))  Blood Culture (routine x 2)     Status: None   Collection Time: 06/15/16 10:50 PM  Result Value Ref Range Status   Specimen Description BLOOD RIGHT HAND  Final   Special Requests IN PEDIATRIC BOTTLE 4ML  Final   Culture NO GROWTH 5 DAYS  Final   Report Status 06/21/2016 FINAL  Final  Blood Culture (routine x 2)     Status: None   Collection Time: 06/15/16 11:01 PM  Result Value Ref Range Status   Specimen Description BLOOD LEFT ARM  Final   Special Requests BOTTLES DRAWN AEROBIC AND ANAEROBIC 10ML  Final   Culture NO GROWTH 5 DAYS  Final   Report Status 06/21/2016 FINAL  Final  Urine culture     Status: Abnormal   Collection Time: 06/15/16 11:56 PM  Result Value Ref Range Status   Specimen Description URINE, RANDOM  Final  Special Requests  NONE  Final   Culture >=100,000 COLONIES/mL YEAST (A)  Final   Report Status 06/18/2016 FINAL  Final  MRSA PCR Screening     Status: None   Collection Time: 06/16/16  4:12 AM  Result Value Ref Range Status   MRSA by PCR NEGATIVE NEGATIVE Final    Comment:        The GeneXpert MRSA Assay (FDA approved for NASAL specimens only), is one component of a comprehensive MRSA colonization surveillance program. It is not intended to diagnose MRSA infection nor to guide or monitor treatment for MRSA infections.      Labs: Basic Metabolic Panel:  Recent Labs Lab 06/19/16 0254 06/20/16 0455 06/21/16 0344 06/22/16 0413 06/23/16 0751  NA 140 141 139 138 140  K 3.6 4.0 3.6 3.6 3.3*  CL 97* 95* 94* 91* 91*  CO2 34* 36* 38* 39* 42*  GLUCOSE 278* 312* 221* 271* 167*  BUN 16 16 12 18 17   CREATININE 1.15* 1.09* 1.04* 1.20* 1.12*  CALCIUM 9.1 9.3 8.9 8.8* 9.1   CBC:  Recent Labs Lab 06/19/16 0254 06/20/16 0455 06/21/16 0344 06/22/16 0413 06/23/16 0751  WBC 2.9* 3.9* 2.8* 4.1 3.6*  HGB 10.0* 10.5* 10.5* 8.4* 10.5*  HCT 32.9* 34.6* 35.9* 35.1* 35.3*  MCV 96.5 97.7 99.4 98.9 98.6  PLT 157 155 132* 149* 152   BNP (last 3 results)  Recent Labs  09/25/15 0440 12/11/15 1831 06/15/16 2250  BNP 131.7* 155.3* 233.2*   CBG:  Recent Labs Lab 06/22/16 1136 06/22/16 1707 06/22/16 2115 06/23/16 0727 06/23/16 1106  GLUCAP 291* 295* 313* 158* 280*   SIGNED: Time coordinating discharge: 30 minutes  MAGICK-Saw Mendenhall, MD  [facility] 06/23/2016, 11:34 AM Pager [phone]  If 7PM-7AM, please contact night-coverage [email or web address] Password [email or web address]

## 2016-06-24 ENCOUNTER — Non-Acute Institutional Stay (SKILLED_NURSING_FACILITY): Payer: Medicare Other | Admitting: Adult Health

## 2016-06-24 ENCOUNTER — Encounter: Payer: Self-pay | Admitting: Adult Health

## 2016-06-24 DIAGNOSIS — I5032 Chronic diastolic (congestive) heart failure: Secondary | ICD-10-CM

## 2016-06-24 DIAGNOSIS — J181 Lobar pneumonia, unspecified organism: Secondary | ICD-10-CM | POA: Diagnosis not present

## 2016-06-24 DIAGNOSIS — E1165 Type 2 diabetes mellitus with hyperglycemia: Secondary | ICD-10-CM

## 2016-06-24 DIAGNOSIS — R5381 Other malaise: Secondary | ICD-10-CM

## 2016-06-24 DIAGNOSIS — D61818 Other pancytopenia: Secondary | ICD-10-CM

## 2016-06-24 DIAGNOSIS — G2581 Restless legs syndrome: Secondary | ICD-10-CM

## 2016-06-24 DIAGNOSIS — J189 Pneumonia, unspecified organism: Secondary | ICD-10-CM

## 2016-06-24 DIAGNOSIS — N179 Acute kidney failure, unspecified: Secondary | ICD-10-CM

## 2016-06-24 DIAGNOSIS — E118 Type 2 diabetes mellitus with unspecified complications: Secondary | ICD-10-CM | POA: Diagnosis not present

## 2016-06-24 DIAGNOSIS — I69359 Hemiplegia and hemiparesis following cerebral infarction affecting unspecified side: Secondary | ICD-10-CM

## 2016-06-24 DIAGNOSIS — IMO0002 Reserved for concepts with insufficient information to code with codable children: Secondary | ICD-10-CM

## 2016-06-24 DIAGNOSIS — I482 Chronic atrial fibrillation, unspecified: Secondary | ICD-10-CM

## 2016-06-24 DIAGNOSIS — E876 Hypokalemia: Secondary | ICD-10-CM

## 2016-06-24 DIAGNOSIS — N390 Urinary tract infection, site not specified: Secondary | ICD-10-CM | POA: Diagnosis not present

## 2016-06-24 DIAGNOSIS — G40909 Epilepsy, unspecified, not intractable, without status epilepticus: Secondary | ICD-10-CM

## 2016-06-24 DIAGNOSIS — Z794 Long term (current) use of insulin: Secondary | ICD-10-CM

## 2016-06-24 DIAGNOSIS — J9602 Acute respiratory failure with hypercapnia: Secondary | ICD-10-CM

## 2016-06-24 DIAGNOSIS — J9601 Acute respiratory failure with hypoxia: Secondary | ICD-10-CM

## 2016-06-24 DIAGNOSIS — D638 Anemia in other chronic diseases classified elsewhere: Secondary | ICD-10-CM

## 2016-06-24 NOTE — Progress Notes (Signed)
DATE:  06/24/2016   MRN:  [id number]  BIRTHDAY: [birth year]  Facility:  Nursing Home Location:  [place] Room Number: 206-B  LEVEL OF CARE:  SNF (31)  Contact Information    Name Relation Home Work Mobile   [place] Daughter [phone]  [phone]   [place] Sister   [phone]       Code Status History    Date Active Date Inactive Code Status Order ID Comments User Context   06/16/2016  3:22 AM 06/23/2016  9:50 PM Partial Code [id number]  Rise Patience, MD ED   06/16/2016  2:35 AM 06/16/2016  3:22 AM Partial Code [id number]  Rise Patience, MD ED   06/09/2016  5:10 PM 06/11/2016 10:12 PM Full Code [id number]  [name], MD ED   12/11/2015 10:31 PM 12/17/2015  6:12 PM DNR [id number]  Rise Patience, MD Inpatient   09/22/2015  9:00 PM 10/01/2015  8:59 PM DNR [id number]  Etta Quill, DO ED   08/29/2015  5:21 PM 09/02/2015  9:34 PM DNR [id number]  Samella Parr, NP Inpatient   01/27/2015  4:04 PM 01/28/2015  8:11 PM DNR [id number]  Willia Craze, NP Inpatient   01/20/2015  3:08 AM 01/25/2015  8:05 PM Full Code [id number]  Florencia Reasons, MD Inpatient   01/20/2015  2:59 AM 01/20/2015  3:08 AM Full Code [id number]  Florencia Reasons, MD Inpatient   12/13/2011  7:43 PM 12/15/2011  7:24 PM Full Code [id number]  Elveria Royals, RN Inpatient   09/22/2011  9:29 PM 09/28/2011  7:54 PM Full Code [id number]  Pricilla Riffle, RN Inpatient    Questions for Most Recent Historical Code Status (Order [id number])    Question Answer Comment   In the event of cardiac or respiratory ARREST: Initiate Code Blue, Call Rapid Response Yes    In the event of cardiac or respiratory ARREST: Perform CPR Yes    In the event of cardiac or respiratory ARREST: Perform Intubation/Mechanical Ventilation No    In the event of cardiac or respiratory ARREST: Use NIPPV/BiPAp only if indicated Yes    In the event of cardiac or respiratory ARREST: Administer ACLS medications if  indicated Yes    In the event of cardiac or respiratory ARREST: Perform Defibrillation or Cardioversion if indicated Yes        Chief Complaint  Patient presents with  . Hospitalization Follow-up    HISTORY OF PRESENT ILLNESS:  This is an 47-YO female seen for hospital follow-up.  She was admitted to [facility] for short-term rehabilitation on 06/23/2016 following an admission at [facility] 06/15/2016-06/23/2016 for lethargy and confusion.  Patient was found to have PNA and hypercapnic respiratory failure with encephalopathy. She was placed on BiPAP and antibiotics with improvement.   She has been seen in the room with daughter and granddaughter visiting.   PAST MEDICAL HISTORY:  Past Medical History:  Diagnosis Date  . Acute delirium 04/12/2011  . Acute kidney injury ([place]) 09/02/2015  . Anemia   . Anxiety   . Arthritis   . Atrial fibrillation ([place])   . Blood transfusion   . Bronchitis   . CHF (congestive heart failure) ([place])   . Diabetes mellitus   . GERD (gastroesophageal reflux disease)   . GI (gastrointestinal bleed)    Due to benign mass  . Hypertension   . Lobar pneumonia due to unspecified organism 09/02/2015  . On home oxygen therapy    "  4L; 24/7" (12/11/2015)  . Restless leg syndrome   . Seizures ([place])   . Stroke [facility])      CURRENT MEDICATIONS: Reviewed  Patient's Medications  New Prescriptions   No medications on file  Previous Medications   ALBUTEROL (PROVENTIL HFA;VENTOLIN HFA) 108 (90 BASE) MCG/ACT INHALER    Inhale 1 puff into the lungs every 6 (six) hours as needed for wheezing or shortness of breath.   APIXABAN (ELIQUIS) 5 MG TABS TABLET    Take 1 tablet (5 mg total) by mouth 2 (two) times daily.   CALCIUM-VITAMIN D (CALCIUM 500+D) 500-400 MG-UNIT PER TABLET    Take 1 tablet by mouth 2 (two) times daily.   DOCUSATE CALCIUM (STOOL SOFTENER PO)    Take 1 capsule by mouth daily as needed (For constipation.). Colace 100 mg   FERROUS SULFATE 325 (65  FE) MG TABLET    Take 325 mg by mouth daily with supper.    FLUCONAZOLE (DIFLUCAN) 100 MG TABLET    Take 1 tablet (100 mg total) by mouth daily.   FUROSEMIDE (LASIX) 40 MG TABLET    Take 40 mg by mouth 2 (two) times daily.   GUAIFENESIN (MUCINEX) 600 MG 12 HR TABLET    Take 2 tablets (1,200 mg total) by mouth 2 (two) times daily.   HYDROXYZINE (ATARAX/VISTARIL) 25 MG TABLET    Take 25 mg by mouth every 8 (eight) hours as needed for itching.    INSULIN ASPART (NOVOLOG) 100 UNIT/ML INJECTION    Inject 0-12 Units into the skin as needed for high blood sugar. 0-150 = 0 units, 151-200 = 2 units, 201-250 = 4 units, 251-300 = 6 units, 301-350 = 8 units, 351-400 = 10 units, 400+ = 12 units and notify NP/MD   INSULIN GLARGINE (LANTUS) 100 UNIT/ML INJECTION    Inject 0.2 mLs (20 Units total) into the skin at bedtime.   IPRATROPIUM-ALBUTEROL (DUONEB) 0.5-2.5 (3) MG/3ML SOLN    Take 3 mLs by nebulization every 6 (six) hours as needed.   LEVETIRACETAM (KEPPRA) 500 MG TABLET    Take 1 tablet (500 mg total) by mouth 2 (two) times daily.   METOPROLOL TARTRATE (LOPRESSOR) 25 MG TABLET    Take 0.5 tablets (12.5 mg total) by mouth 2 (two) times daily.   NAPHAZOLINE-PHENIRAMINE (ALLERGY EYE OP)    Place 1 drop into both eyes as needed (For eye irritation.).   PANTOPRAZOLE (PROTONIX) 40 MG TABLET    Take 1 tablet (40 mg total) by mouth 2 (two) times daily before a meal.   POLYETHYLENE GLYCOL (MIRALAX / GLYCOLAX) PACKET    Take 17 g by mouth daily as needed for mild constipation.   PREGABALIN (LYRICA) 75 MG CAPSULE    Take 75 mg by mouth at bedtime.    ROPINIROLE (REQUIP) 2 MG TABLET    Take 1 tablet (2 mg total) by mouth 3 (three) times daily.   VITAMIN D, ERGOCALCIFEROL, (DRISDOL) 50000 UNITS CAPS CAPSULE    Take 50,000 Units by mouth every Saturday. No specific day    VOLTAREN 1 % GEL    Apply 1 application topically 4 (four) times daily as needed (For knee pain.).   Modified Medications   No medications on file    Discontinued Medications   No medications on file     Allergies  Allergen Reactions  . Erythromycin Other (See Comments)    Vaginal itching  . Penicillins Rash and Other (See Comments)    Has patient had a  PCN reaction causing immediate rash, facial/tongue/throat swelling, SOB or lightheadedness with hypotension: No Has patient had a PCN reaction causing severe rash involving mucus membranes or skin necrosis: No Has patient had a PCN reaction that required hospitalization: No Has patient had a PCN reaction occurring within the last 10 years: No If all of the above answers are "NO", then may proceed with Cephalosporin use.  Marland Kitchen Zithromax [Azithromycin] Other (See Comments)    gave her a yeast infection.     REVIEW OF SYSTEMS:  GENERAL: no change in appetite, no fatigue, no weight changes, no fever, chills or weakness EYES: Denies change in vision, dry eyes, eye pain, itching or discharge EARS: Denies change in hearing, ringing in ears, or earache NOSE: Denies nasal congestion or epistaxis MOUTH and THROAT: Denies oral discomfort, gingival pain or bleeding, pain from teeth or hoarseness   RESPIRATORY: no cough, SOB, DOE, wheezing, hemoptysis CARDIAC: no chest pain, or palpitations GI: no abdominal pain, diarrhea, constipation, heart burn, nausea or vomiting GU: Denies dysuria, frequency, hematuria, incontinence, or discharge PSYCHIATRIC: Denies feeling of depression or anxiety. No report of hallucinations, insomnia, paranoia, or agitation    PHYSICAL EXAMINATION  GENERAL APPEARANCE: Well nourished. In no acute distress. Morbidly obese SKIN:  Skin is warm and dry.  HEAD: Normal in size and contour. No evidence of trauma EYES: Lids open and close normally. No blepharitis, entropion or ectropion. PERRL. Conjunctivae are clear and sclerae are white. Lenses are without opacity EARS: Pinnae are normal. Patient hears normal voice tunes of the examiner MOUTH and THROAT: Lips are  without lesions. Oral mucosa is moist and without lesions. Tongue is normal in shape, size, and color and without lesions NECK: supple, trachea midline, no neck masses, no thyroid tenderness, no thyromegaly LYMPHATICS: no LAN in the neck, no supraclavicular LAN RESPIRATORY: breathing is even & unlabored, BS CTAB; on O2 @ 2L/min via [place] CARDIAC: RRR, no murmur,no extra heart sounds, BLE trace edema GI: abdomen soft, normal BS, no masses, no tenderness, no hepatomegaly, no splenomegaly EXTREMITIES:  Able to move BUE and RLE; unable to move LLE; RUE with slight weakness PSYCHIATRIC: Alert and oriented X 3. Affect and behavior are appropriate   LABS/RADIOLOGY: Labs reviewed: Basic Metabolic Panel:  Recent Labs  09/28/15 0535 09/29/15 0459 09/30/15 0341  06/09/16 1802  06/21/16 0344 06/22/16 0413 06/23/16 0751  NA 137 137 140  < >  --   < > 139 138 140  K 3.8 3.7 3.6  < >  --   < > 3.6 3.6 3.3*  CL 89* 92* 95*  < >  --   < > 94* 91* 91*  CO2 38* 37* 38*  < >  --   < > 38* 39* 42*  GLUCOSE 179* 248* 195*  < >  --   < > 221* 271* 167*  BUN 29* 38* 38*  < >  --   < > 12 18 17   CREATININE 1.12* 1.31* 1.28*  < >  --   < > 1.04* 1.20* 1.12*  CALCIUM 9.3 9.2 9.1  < >  --   < > 8.9 8.8* 9.1  MG 2.2 2.1 2.2  --  1.7  --   --   --   --   PHOS 3.1  --   --   --   --   --   --   --   --   < > = values in this interval not displayed. Liver Function  Tests:  Recent Labs  06/10/16 0411 06/15/16 2250 06/16/16 0418  AST 14* 11* 16  ALT 11* 10* 10*  ALKPHOS 40 45 43  BILITOT 1.2 0.9 0.9  PROT 5.9* 6.1* 6.1*  ALBUMIN 2.9* 3.1* 3.1*    Recent Labs  09/26/15 1638 12/11/15 1831 12/12/15 0050  AMMONIA 36* 51* 26   CBC:  Recent Labs  06/09/16 1421  06/15/16 2250 06/16/16 0418  06/21/16 0344 06/22/16 0413 06/23/16 0751  WBC 5.0  < > 3.8* 3.1*  < > 2.8* 4.1 3.6*  NEUTROABS 3.5  --  2.4 1.6*  --   --   --   --   HGB 13.1  < > 10.7* 10.9*  < > 10.5* 8.4* 10.5*  HCT 42.6  < > 35.1*  37.2  < > 35.9* 35.1* 35.3*  MCV 98.6  < > 99.4 100.3*  < > 99.4 98.9 98.6  PLT 133*  < > 156 153  < > 132* 149* 152  < > = values in this interval not displayed.  Cardiac Enzymes:  Recent Labs  06/09/16 1802 06/09/16 2232 06/15/16 2250  TROPONINI 0.03* <0.03 <0.03   CBG:  Recent Labs  06/23/16 0727 06/23/16 1106 06/23/16 1710  GLUCAP 158* 280* 260*      Dg Chest 2 View  Result Date: 06/09/2016 CLINICAL DATA:  Fever and cough EXAM: CHEST  2 VIEW COMPARISON:  December 11, 2015 FINDINGS: There is consolidation in the posterior left base region. Lungs elsewhere clear. Heart is mildly enlarged. The pulmonary vascularity has a pattern suggesting a degree of pulmonary arterial hypertension. No adenopathy evident. No bone lesions. IMPRESSION: Posterior left base consolidation consistent with pneumonia. Mild cardiomegaly. Suspect a degree of pulmonary arterial hypertension. Followup PA and lateral chest radiographs recommended in 3-4 weeks following trial of antibiotic therapy to ensure resolution and exclude underlying malignancy. Electronically Signed   By: Lowella Grip III M.D.   On: 06/09/2016 15:09   Ct Head Wo Contrast  Result Date: 06/16/2016 CLINICAL DATA:  Altered mental status EXAM: CT HEAD WITHOUT CONTRAST TECHNIQUE: Contiguous axial images were obtained from the base of the skull through the vertex without intravenous contrast. COMPARISON:  12/11/2015 CT FINDINGS: Brain: Mild sulcal prominence bilaterally consistent superficial atrophy. Minimal small vessel ischemic disease of periventricular white matter. No large vascular territory infarction, hemorrhage or midline shift. Left basal ganglial lacunar infarcts appear chronic. Fourth ventricle is midline. No extra-axial fluid collections. Vascular: Moderate atherosclerosis of the carotid siphons. No hyperdense vessels. Skull: Normal. Negative for fracture or focal lesion. Osteopenic appearance of the bony calvarium.  Sinuses/Orbits: Inferior mastoid air cells are partially opacified consistent with a mastoid effusion. Minimal ethmoid sinus mucosal thickening. The visualized sphenoid and maxillary sinuses as well as the frontal sinus appear clear. Other: None IMPRESSION: No acute intracranial abnormality. Chronic small vessel ischemic disease and left-sided basal ganglial lacunar infarcts. Chronic small mastoid effusion on the right. Moderate atherosclerosis of carotid siphons Electronically Signed   By: Ashley Royalty M.D.   On: 06/16/2016 00:25   Dg Chest Port 1 View  Result Date: 06/15/2016 CLINICAL DATA:  Lethargic tonight. Discharge from hospital for days ago with urinary tract infection and pneumonia. EXAM: PORTABLE CHEST 1 VIEW COMPARISON:  06/09/2016 FINDINGS: Shallow inspiration. Cardiac enlargement with mild pulmonary vascular congestion. Bilateral perihilar infiltrates are progressing since previous study and may indicate edema or progressing pneumonia. Small bilateral pleural effusions. Calcified and tortuous aorta. No pneumothorax. IMPRESSION: Progression of perihilar infiltrates may represent developing  edema or progressing pneumonia. Small pleural effusions. Electronically Signed   By: Lucienne Capers M.D.   On: 06/15/2016 23:19    ASSESSMENT/PLAN:  Physical deconditioning - for rehabilitation, PT and OT, for therapeutic strengthening exercises; fall precautions  Acute hypercapnic respiratory failure - likely due to acute on chronic combined CHF, pneumonia and sleep apnea in the setting of morbid obesity; continue O2 at 2 L/minute via [place], was put on antibiotics, continued Lasix and started on BiPAP  Community acquired pneumonia - she was started on vancomycin and maxipime, vancomycin was discontinued 06/16/16; has completed therapy with Levaquin; continue Mucinex 600 mg 2 tabs = 100 mg by mouth twice a day X 1 week  UTI - has yeast in urine - continue Fluconazole 100 mg 1 tab by mouth daily 2  days  Morbid obesity - Body mass index is 44.39 kg/m.; counseled  Acute kidney injury - avoid ACEI Lab Results  Component Value Date   CREATININE 1.12 (H) 06/23/2016    Hypokalemia -  Was supplemented in the hospital, avoid scheduled supplementation due to propensity towards hyperkalemia; check BMP Lab Results  Component Value Date   K 3.3 (L) 06/23/2016   Chronic atrial fibrillation - rate controlled; continue metoprolol tartrate 25 mg 1/2 tab = 12.5 mg PO BID and Eliquis 5 mg 1 tab by mouth twice a day  Diabetes mellitus, type II - continue Lantus 100 units/mL 20 units subcutaneous daily at bedtime and NovoLog 100 units/mL sliding scale subcutaneous ACHS; CBG ACHS Lab Results  Component Value Date   HGBA1C 9.4 (H) 06/09/2016   Pancytopenia - thought to be reactive in the setting of acute illness and currently on blood thinner; monitor for bleeding  History of CVA with left  hemiparesis - continue Eliquis 5 mg 1 tab by mouth twice a day  Chronic diastolic heart failure - continue O2 at 2 L/minute via [place]; continue Lasix 40 mg 1 tab by mouth twice a day; weigh Q Mondays-Wednesdays-Fridays  Seizure disorder - continue Keppra 500 mg 1 tab by mouth twice a day  Restless leg syndrome - continue Lyrica 75 mg 1 capsule by mouth daily at bedtime and Requip 2 mg 1 tab by mouth 3 times a day  Anemia of chronic disease - continue ferrous sulfate 325 mg 1 tab by mouth daily      Goals of care:  Short-term rehabilitation   Elizabeth Pugh C. Bibo - NP    [employer] [phone]

## 2016-06-25 LAB — BASIC METABOLIC PANEL
BUN: 15 mg/dL (ref 4–21)
CREATININE: 1 mg/dL (ref 0.5–1.1)
GLUCOSE: 198 mg/dL
POTASSIUM: 3.9 mmol/L (ref 3.4–5.3)
SODIUM: 141 mmol/L (ref 137–147)

## 2016-06-25 LAB — CBC AND DIFFERENTIAL
HEMATOCRIT: 34 % — AB (ref 36–46)
Hemoglobin: 10.3 g/dL — AB (ref 12.0–16.0)
Platelets: 137 10*3/uL — AB (ref 150–399)
WBC: 2.6 10^3/mL

## 2016-07-08 ENCOUNTER — Encounter: Payer: Self-pay | Admitting: Internal Medicine

## 2016-07-08 ENCOUNTER — Non-Acute Institutional Stay (SKILLED_NURSING_FACILITY): Payer: Medicare Other | Admitting: Internal Medicine

## 2016-07-08 DIAGNOSIS — G40909 Epilepsy, unspecified, not intractable, without status epilepticus: Secondary | ICD-10-CM | POA: Diagnosis not present

## 2016-07-08 DIAGNOSIS — I5032 Chronic diastolic (congestive) heart failure: Secondary | ICD-10-CM | POA: Diagnosis not present

## 2016-07-08 DIAGNOSIS — I482 Chronic atrial fibrillation, unspecified: Secondary | ICD-10-CM

## 2016-07-08 DIAGNOSIS — K219 Gastro-esophageal reflux disease without esophagitis: Secondary | ICD-10-CM

## 2016-07-08 DIAGNOSIS — J9611 Chronic respiratory failure with hypoxia: Secondary | ICD-10-CM | POA: Diagnosis not present

## 2016-07-08 DIAGNOSIS — R600 Localized edema: Secondary | ICD-10-CM

## 2016-07-08 DIAGNOSIS — Z8673 Personal history of transient ischemic attack (TIA), and cerebral infarction without residual deficits: Secondary | ICD-10-CM

## 2016-07-08 DIAGNOSIS — R5381 Other malaise: Secondary | ICD-10-CM

## 2016-07-08 DIAGNOSIS — J9612 Chronic respiratory failure with hypercapnia: Secondary | ICD-10-CM

## 2016-07-08 DIAGNOSIS — J189 Pneumonia, unspecified organism: Secondary | ICD-10-CM | POA: Diagnosis not present

## 2016-07-08 DIAGNOSIS — N3000 Acute cystitis without hematuria: Secondary | ICD-10-CM | POA: Diagnosis not present

## 2016-07-08 DIAGNOSIS — E611 Iron deficiency: Secondary | ICD-10-CM

## 2016-07-08 DIAGNOSIS — E1149 Type 2 diabetes mellitus with other diabetic neurological complication: Secondary | ICD-10-CM

## 2016-07-08 DIAGNOSIS — G2581 Restless legs syndrome: Secondary | ICD-10-CM

## 2016-07-08 NOTE — Progress Notes (Signed)
LOCATION: [facility]  PCP: Philis Fendt, MD   Code Status: full code  Goals of care: Advanced Directive information Advanced Directives 06/16/2016  Does Patient Have a Medical Advance Directive? No  Type of Advance Directive -  Does patient want to make changes to medical advance directive? -  Copy of [place] in Chart? -  Would patient like information on creating a medical advance directive? No - Patient declined  Pre-existing out of facility DNR order (yellow form or pink MOST form) -       Extended Emergency Contact Information Primary Emergency Contact: Henderson,[name] Address: [address] [place], [place] [address] [place] Phone: [phone] Mobile Phone: [phone] Relation: Daughter Secondary Emergency Contact: Clarke,Carolyn  [place] Mobile Phone: [phone] Relation: Sister   Allergies  Allergen Reactions  . Erythromycin Other (See Comments)    Vaginal itching  . Penicillins Rash and Other (See Comments)    Has patient had a PCN reaction causing immediate rash, facial/tongue/throat swelling, SOB or lightheadedness with hypotension: No Has patient had a PCN reaction causing severe rash involving mucus membranes or skin necrosis: No Has patient had a PCN reaction that required hospitalization: No Has patient had a PCN reaction occurring within the last 10 years: No If all of the above answers are "NO", then may proceed with Cephalosporin use.  Marland Kitchen Zithromax [Azithromycin] Other (See Comments)    gave her a yeast infection.    Chief Complaint  Patient presents with  . New Admit To SNF     HPI:  Patient is a 83 y.o. female seen today for short term rehabilitation post hospital admission from 06/15/16-06/23/16 with hypercarbic respiratory failure and altered mental state from pneumonia. She responded well to BiPAP and antibiotics. She was treated for yeast in her urine and AKI as  well. She is seen in her room today. She has medical history of type 2 DM, afib, obesity among others.   Review of Systems:  Constitutional: Negative for fever, chills, diaphoresis.  HENT: Negative for headache, congestion, difficulty swallowing.   Eyes: Negative for eye pain, blurred vision, double vision and discharge.  Respiratory: Negative for cough, shortness of breath and wheezing.  on chronic o2 2l/min Cardiovascular: Negative for chest pain, palpitations, leg swelling.  Gastrointestinal: Negative for heartburn, nausea, vomiting, abdominal pain, loss of appetite. She had a bowel movement yesterday. Genitourinary: Negative for dysuria.  Musculoskeletal: Negative for back pain, fall.  Skin: Negative for itching, rash.  Neurological: Negative for dizziness. Psychiatric/Behavioral: Negative for depression.   Past Medical History:  Diagnosis Date  . Acute delirium 04/12/2011  . Acute kidney injury ([place]) 09/02/2015  . Anemia   . Anxiety   . Arthritis   . Atrial fibrillation ([place])   . Blood transfusion   . Bronchitis   . CHF (congestive heart failure) ([place])   . Diabetes mellitus   . GERD (gastroesophageal reflux disease)   . GI (gastrointestinal bleed)    Due to benign mass  . Hypertension   . Lobar pneumonia due to unspecified organism 09/02/2015  . On home oxygen therapy    "4L; 24/7" (12/11/2015)  . Restless leg syndrome   . Seizures ([place])   . Stroke [facility])    Past Surgical History:  Procedure Laterality Date  . CATARACT EXTRACTION W/ INTRAOCULAR LENS IMPLANT     "Dr. Gershon Crane; think they did the right one"  .  EUS  09/24/2011   Procedure: UPPER ENDOSCOPIC ULTRASOUND (EUS) LINEAR;  Surgeon: Beryle Beams, MD;  Location: [facility] ENDOSCOPY;  Service: Endoscopy;  Laterality: N/A;  . TONSILLECTOMY  1935  . TUBAL LIGATION     Social History:   reports that she quit smoking about 30 years ago. Her smoking use included Cigarettes. She has a 40.00 pack-year smoking history. She has never  used smokeless tobacco. She reports that she does not drink alcohol or use drugs.  Family History  Problem Relation Age of Onset  . Aneurysm Mother   . Heart attack Father   . Heart failure Sister   . Asthma Sister   . Kidney failure Sister   . Stroke Sister     Medications: Allergies as of 07/08/2016      Reactions   Erythromycin Other (See Comments)   Vaginal itching   Penicillins Rash, Other (See Comments)   Has patient had a PCN reaction causing immediate rash, facial/tongue/throat swelling, SOB or lightheadedness with hypotension: No Has patient had a PCN reaction causing severe rash involving mucus membranes or skin necrosis: No Has patient had a PCN reaction that required hospitalization: No Has patient had a PCN reaction occurring within the last 10 years: No If all of the above answers are "NO", then may proceed with Cephalosporin use.   Zithromax [azithromycin] Other (See Comments)   gave her a yeast infection.      Medication List       Accurate as of 07/08/16  3:39 PM. Always use your most recent med list.          albuterol 108 (90 Base) MCG/ACT inhaler Commonly known as:  PROVENTIL HFA;VENTOLIN HFA Inhale 1 puff into the lungs every 6 (six) hours as needed for wheezing or shortness of breath.   ALLERGY EYE OP Place 1 drop into both eyes as needed (For eye irritation.).   apixaban 5 MG Tabs tablet Commonly known as:  ELIQUIS Take 1 tablet (5 mg total) by mouth 2 (two) times daily.   CALCIUM 500+D 500-400 MG-UNIT tablet Generic drug:  calcium-vitamin D Take 1 tablet by mouth 2 (two) times daily.   ferrous sulfate 325 (65 FE) MG tablet Take 325 mg by mouth daily with supper.   furosemide 40 MG tablet Commonly known as:  LASIX Take 40 mg by mouth 2 (two) times daily.   hydrOXYzine 25 MG tablet Commonly known as:  ATARAX/VISTARIL Take 25 mg by mouth every 8 (eight) hours as needed for itching.   insulin aspart 100 UNIT/ML injection Commonly known as:   novoLOG Inject 0-12 Units into the skin as needed for high blood sugar. 0-150 = 0 units, 151-200 = 2 units, 201-250 = 4 units, 251-300 = 6 units, 301-350 = 8 units, 351-400 = 10 units, 400+ = 12 units and notify NP/MD   insulin glargine 100 UNIT/ML injection Commonly known as:  LANTUS Inject 0.2 mLs (20 Units total) into the skin at bedtime.   ipratropium-albuterol 0.5-2.5 (3) MG/3ML Soln Commonly known as:  DUONEB Take 3 mLs by nebulization every 6 (six) hours as needed.   levETIRAcetam 500 MG tablet Commonly known as:  KEPPRA Take 1 tablet (500 mg total) by mouth 2 (two) times daily.   metoprolol tartrate 25 MG tablet Commonly known as:  LOPRESSOR Take 0.5 tablets (12.5 mg total) by mouth 2 (two) times daily.   pantoprazole 40 MG tablet Commonly known as:  PROTONIX Take 1 tablet (40 mg total) by mouth 2 (  two) times daily before a meal.   polyethylene glycol packet Commonly known as:  MIRALAX / GLYCOLAX Take 17 g by mouth daily as needed for mild constipation.   pregabalin 75 MG capsule Commonly known as:  LYRICA Take 75 mg by mouth at bedtime.   rOPINIRole 2 MG tablet Commonly known as:  REQUIP Take 1 tablet (2 mg total) by mouth 3 (three) times daily.   STOOL SOFTENER PO Take 100 mg by mouth daily as needed (For constipation.). Colace   Vitamin D (Ergocalciferol) 50000 units Caps capsule Commonly known as:  DRISDOL Take 50,000 Units by mouth every Saturday. No specific day   VOLTAREN 1 % Gel Generic drug:  diclofenac sodium Apply 1 application topically 4 (four) times daily as needed (For knee pain.).       Immunizations: Immunization History  Administered Date(s) Administered  . Influenza Split 03/26/2011  . Influenza,inj,Quad PF,36+ Mos 01/28/2015     Physical Exam: Vitals:   07/08/16 1500  BP: 106/61  Pulse: 87  Resp: 18  Temp: 97.6 F (36.4 C)  TempSrc: Oral  SpO2: 99%  Weight: 282 lb 3.2 oz (128 kg)  Height: 5\' 6"  (1.676 m)   Body mass  index is 45.55 kg/m.  General- elderly female, Morbidly obese, in no acute distress Head- normocephalic, atraumatic Nose- no nasal discharge Throat- moist mucus membrane Eyes- PERRLA, EOMI, no pallor, no icterus, no discharge, normal conjunctiva, normal sclera Neck- no cervical lymphadenopathy Cardiovascular- normal s1,s2, no murmur Respiratory- bilateral poor air entry, no wheezing, no crackles, no rhonchi, on oxygen 2 L by nasal cannula Abdomen- bowel sounds present, soft, non tender Musculoskeletal- able to move all 4 extremities, generalized weakness prominence to her left lower extremity, 1+ leg edema with chronic venous stasis changes Neurological- alert and oriented to person, place and time Skin- warm and dry Psychiatry- normal mood and affect    Labs reviewed: Basic Metabolic Panel:  Recent Labs  09/28/15 0535 09/29/15 0459 09/30/15 0341  06/09/16 1802  06/21/16 0344 06/22/16 0413 06/23/16 0751 06/25/16  NA 137 137 140  < >  --   < > 139 138 140 141  K 3.8 3.7 3.6  < >  --   < > 3.6 3.6 3.3* 3.9  CL 89* 92* 95*  < >  --   < > 94* 91* 91*  --   CO2 38* 37* 38*  < >  --   < > 38* 39* 42*  --   GLUCOSE 179* 248* 195*  < >  --   < > 221* 271* 167*  --   BUN 29* 38* 38*  < >  --   < > 12 18 17 15   CREATININE 1.12* 1.31* 1.28*  < >  --   < > 1.04* 1.20* 1.12* 1.0  CALCIUM 9.3 9.2 9.1  < >  --   < > 8.9 8.8* 9.1  --   MG 2.2 2.1 2.2  --  1.7  --   --   --   --   --   PHOS 3.1  --   --   --   --   --   --   --   --   --   < > = values in this interval not displayed. Liver Function Tests:  Recent Labs  06/10/16 0411 06/15/16 2250 06/16/16 0418  AST 14* 11* 16  ALT 11* 10* 10*  ALKPHOS 40 45 43  BILITOT 1.2 0.9 0.9  PROT 5.9* 6.1* 6.1*  ALBUMIN 2.9* 3.1* 3.1*   No results for input(s): LIPASE, AMYLASE in the last 8760 hours.  Recent Labs  09/26/15 1638 12/11/15 1831 12/12/15 0050  AMMONIA 36* 51* 26   CBC:  Recent Labs  06/09/16 1421  06/15/16 2250  06/16/16 0418  06/21/16 0344 06/22/16 0413 06/23/16 0751 06/25/16  WBC 5.0  < > 3.8* 3.1*  < > 2.8* 4.1 3.6* 2.6  NEUTROABS 3.5  --  2.4 1.6*  --   --   --   --   --   HGB 13.1  < > 10.7* 10.9*  < > 10.5* 8.4* 10.5* 10.3*  HCT 42.6  < > 35.1* 37.2  < > 35.9* 35.1* 35.3* 34*  MCV 98.6  < > 99.4 100.3*  < > 99.4 98.9 98.6  --   PLT 133*  < > 156 153  < > 132* 149* 152 137*  < > = values in this interval not displayed. Cardiac Enzymes:  Recent Labs  06/09/16 1802 06/09/16 2232 06/15/16 2250  TROPONINI 0.03* <0.03 <0.03   BNP: Invalid input(s): POCBNP CBG:  Recent Labs  06/23/16 0727 06/23/16 1106 06/23/16 1710  GLUCAP 158* 280* 260*    Radiological Exams: Dg Chest 2 View  Result Date: 06/09/2016 CLINICAL DATA:  Fever and cough EXAM: CHEST  2 VIEW COMPARISON:  December 11, 2015 FINDINGS: There is consolidation in the posterior left base region. Lungs elsewhere clear. Heart is mildly enlarged. The pulmonary vascularity has a pattern suggesting a degree of pulmonary arterial hypertension. No adenopathy evident. No bone lesions. IMPRESSION: Posterior left base consolidation consistent with pneumonia. Mild cardiomegaly. Suspect a degree of pulmonary arterial hypertension. Followup PA and lateral chest radiographs recommended in 3-4 weeks following trial of antibiotic therapy to ensure resolution and exclude underlying malignancy. Electronically Signed   By: Lowella Grip III M.D.   On: 06/09/2016 15:09   Ct Head Wo Contrast  Result Date: 06/16/2016 CLINICAL DATA:  Altered mental status EXAM: CT HEAD WITHOUT CONTRAST TECHNIQUE: Contiguous axial images were obtained from the base of the skull through the vertex without intravenous contrast. COMPARISON:  12/11/2015 CT FINDINGS: Brain: Mild sulcal prominence bilaterally consistent superficial atrophy. Minimal small vessel ischemic disease of periventricular white matter. No large vascular territory infarction, hemorrhage or midline shift.  Left basal ganglial lacunar infarcts appear chronic. Fourth ventricle is midline. No extra-axial fluid collections. Vascular: Moderate atherosclerosis of the carotid siphons. No hyperdense vessels. Skull: Normal. Negative for fracture or focal lesion. Osteopenic appearance of the bony calvarium. Sinuses/Orbits: Inferior mastoid air cells are partially opacified consistent with a mastoid effusion. Minimal ethmoid sinus mucosal thickening. The visualized sphenoid and maxillary sinuses as well as the frontal sinus appear clear. Other: None IMPRESSION: No acute intracranial abnormality. Chronic small vessel ischemic disease and left-sided basal ganglial lacunar infarcts. Chronic small mastoid effusion on the right. Moderate atherosclerosis of carotid siphons Electronically Signed   By: Ashley Royalty M.D.   On: 06/16/2016 00:25   Dg Chest Port 1 View  Result Date: 06/15/2016 CLINICAL DATA:  Lethargic tonight. Discharge from hospital for days ago with urinary tract infection and pneumonia. EXAM: PORTABLE CHEST 1 VIEW COMPARISON:  06/09/2016 FINDINGS: Shallow inspiration. Cardiac enlargement with mild pulmonary vascular congestion. Bilateral perihilar infiltrates are progressing since previous study and may indicate edema or progressing pneumonia. Small bilateral pleural effusions. Calcified and tortuous aorta. No pneumothorax. IMPRESSION: Progression of perihilar infiltrates may represent developing edema or progressing pneumonia. Small pleural effusions.  Electronically Signed   By: Lucienne Capers M.D.   On: 06/15/2016 23:19    Assessment/Plan  Physical deconditioning With generalized weakness. Will have her work with physical therapy and occupational therapy team to help with gait training and muscle strengthening exercises.fall precautions. Skin care. Encourage to be out of bed.   HCAP Has completed her antibiotic course. Breathing overall stable. Continue o2 by nasal canula. Continue incentive spirometer.  Currently on when necessary DuoNeb. Change DuoNeb nebulizer to 4 times a day scheduled for 5 days and then on a needed basis to help loosen the mucus and improve her breathing  UTI Has completed her antibiotic. Currently asymptomatic. Hydration to be maintained.. Hygiene to be maintained.  Lower extremity edema With venous stasis. Continue furosemide 40 mg twice a day. Monitor BMP  Chronic respiratory failure Monitor breathing, continue o2 by nasal canula. Make changes to duoneb as above and add mucinex dm for a eek as well  Seizure disorder Continue Keppra 500 mg twice a day  Atrial fibrillation Continue metoprolol tartrate 12.5 mg twice a day and eliquis 5 mg twice a day.  Type 2 diabetes mellitus with neuropathy Monitor blood sugar reading. Continue Lantus 20 units daily and Humalog sliding scale insulin. Continue Lyrica 75 mg daily for neuropathy pain.  Chronic diastolic chf Continue lasix and b blocker, monitor weight  GERD Stable symptoms. Continue pantoprazole 40 mg twice a day  Restless leg syndrome Currently on Requip, monitor clinically  Iron deficiency anemia Continue ferrous sulfate supplement, monitor CBC, continue stool softener on a needed basis  History of CVA With residual LLE weakness. Continue eliquis and metoprolol. Monitor clinically. Fall precaution   Goals of care: short term rehabilitation   Labs/tests ordered: cbc, bmp 07/10/16  Family/ staff Communication: reviewed care plan with patient and nursing supervisor    Blanchie Serve, MD Internal Medicine [place], [place] [address] Cell Phone (Monday-Friday 8 am - 5 pm): [phone] On Call: [phone] and follow prompts after 5 pm and on weekends Office Phone: [phone] Office Fax: [phone]

## 2016-07-09 ENCOUNTER — Non-Acute Institutional Stay (SKILLED_NURSING_FACILITY): Payer: Medicare Other | Admitting: Adult Health

## 2016-07-09 ENCOUNTER — Encounter: Payer: Self-pay | Admitting: Adult Health

## 2016-07-09 DIAGNOSIS — D638 Anemia in other chronic diseases classified elsewhere: Secondary | ICD-10-CM | POA: Diagnosis not present

## 2016-07-09 DIAGNOSIS — E118 Type 2 diabetes mellitus with unspecified complications: Secondary | ICD-10-CM

## 2016-07-09 DIAGNOSIS — I69359 Hemiplegia and hemiparesis following cerebral infarction affecting unspecified side: Secondary | ICD-10-CM | POA: Diagnosis not present

## 2016-07-09 DIAGNOSIS — E1165 Type 2 diabetes mellitus with hyperglycemia: Secondary | ICD-10-CM

## 2016-07-09 DIAGNOSIS — J9611 Chronic respiratory failure with hypoxia: Secondary | ICD-10-CM | POA: Diagnosis not present

## 2016-07-09 DIAGNOSIS — J181 Lobar pneumonia, unspecified organism: Secondary | ICD-10-CM | POA: Diagnosis not present

## 2016-07-09 DIAGNOSIS — IMO0002 Reserved for concepts with insufficient information to code with codable children: Secondary | ICD-10-CM

## 2016-07-09 DIAGNOSIS — I482 Chronic atrial fibrillation, unspecified: Secondary | ICD-10-CM

## 2016-07-09 DIAGNOSIS — I5032 Chronic diastolic (congestive) heart failure: Secondary | ICD-10-CM | POA: Diagnosis not present

## 2016-07-09 DIAGNOSIS — J9612 Chronic respiratory failure with hypercapnia: Secondary | ICD-10-CM

## 2016-07-09 DIAGNOSIS — Z794 Long term (current) use of insulin: Secondary | ICD-10-CM

## 2016-07-09 DIAGNOSIS — N390 Urinary tract infection, site not specified: Secondary | ICD-10-CM

## 2016-07-09 DIAGNOSIS — E876 Hypokalemia: Secondary | ICD-10-CM | POA: Diagnosis not present

## 2016-07-09 DIAGNOSIS — R5381 Other malaise: Secondary | ICD-10-CM

## 2016-07-09 DIAGNOSIS — J189 Pneumonia, unspecified organism: Secondary | ICD-10-CM

## 2016-07-09 DIAGNOSIS — G40909 Epilepsy, unspecified, not intractable, without status epilepticus: Secondary | ICD-10-CM | POA: Diagnosis not present

## 2016-07-09 NOTE — Progress Notes (Signed)
DATE:  07/09/2016   MRN:  [id number]  BIRTHDAY: January 24, 1927  Facility:  Nursing Home Location:  [place] Room Number: 206-B  LEVEL OF CARE:  SNF (31)  Contact Information    Name Relation Home Work Mobile   [place] Daughter [phone]  [phone]   [place] Sister   [phone]       Code Status History    Date Active Date Inactive Code Status Order ID Comments User Context   06/16/2016  3:22 AM 06/23/2016  9:50 PM Partial Code [id number]  Rise Patience, MD ED   06/16/2016  2:35 AM 06/16/2016  3:22 AM Partial Code [id number]  Rise Patience, MD ED   06/09/2016  5:10 PM 06/11/2016 10:12 PM Full Code [id number]  [name], MD ED   12/11/2015 10:31 PM 12/17/2015  6:12 PM DNR [id number]  Rise Patience, MD Inpatient   09/22/2015  9:00 PM 10/01/2015  8:59 PM DNR [id number]  Etta Quill, DO ED   08/29/2015  5:21 PM 09/02/2015  9:34 PM DNR [id number]  Samella Parr, NP Inpatient   01/27/2015  4:04 PM 01/28/2015  8:11 PM DNR [id number]  Willia Craze, NP Inpatient   01/20/2015  3:08 AM 01/25/2015  8:05 PM Full Code [id number]  Florencia Reasons, MD Inpatient   01/20/2015  2:59 AM 01/20/2015  3:08 AM Full Code [id number]  Florencia Reasons, MD Inpatient   12/13/2011  7:43 PM 12/15/2011  7:24 PM Full Code [id number]  Elveria Royals, RN Inpatient   09/22/2011  9:29 PM 09/28/2011  7:54 PM Full Code [id number]  Pricilla Riffle, RN Inpatient    Questions for Most Recent Historical Code Status (Order [id number])    Question Answer Comment   In the event of cardiac or respiratory ARREST: Initiate Code Blue, Call Rapid Response Yes    In the event of cardiac or respiratory ARREST: Perform CPR Yes    In the event of cardiac or respiratory ARREST: Perform Intubation/Mechanical Ventilation No    In the event of cardiac or respiratory ARREST: Use NIPPV/BiPAp only if indicated Yes    In the event of cardiac or respiratory ARREST: Administer ACLS medications if  indicated Yes    In the event of cardiac or respiratory ARREST: Perform Defibrillation or Cardioversion if indicated Yes        Chief Complaint  Patient presents with  . Discharge Note    HISTORY OF PRESENT ILLNESS:  This is an 51-YO female seen for a discharge visit.  She will discharge to home on 07/11/2016 with home health PT, OT, CNA, Nursing, and Social Work services.    She was admitted to [facility] for short-term rehabilitation on 06/23/2016 following an admission at [facility] 06/15/2016-06/23/2016 for lethargy and confusion.  Patient was found to have PNA and hypercapnic respiratory failure with encephalopathy. She was placed on BiPAP and antibiotics with improvement.   Patient was admitted to this facility for short-term rehabilitation after the patient's recent hospitalization.  Patient has completed SNF rehabilitation and therapy has cleared the patient for discharge.   PAST MEDICAL HISTORY:  Past Medical History:  Diagnosis Date  . Acute delirium 04/12/2011  . Acute kidney injury ([place]) 09/02/2015  . Anemia   . Anxiety   . Arthritis   . Atrial fibrillation ([place])   . Blood transfusion   . Bronchitis   . CHF (congestive heart failure) ([place])   . Diabetes mellitus   .  GERD (gastroesophageal reflux disease)   . GI (gastrointestinal bleed)    Due to benign mass  . Hypertension   . Lobar pneumonia due to unspecified organism 09/02/2015  . On home oxygen therapy    "4L; 24/7" (12/11/2015)  . Restless leg syndrome   . Seizures ([place])   . Stroke [facility])      CURRENT MEDICATIONS: Reviewed  Patient's Medications  New Prescriptions   No medications on file  Previous Medications   ALBUTEROL (PROVENTIL HFA;VENTOLIN HFA) 108 (90 BASE) MCG/ACT INHALER    Inhale 1 puff into the lungs every 6 (six) hours as needed for wheezing or shortness of breath.   APIXABAN (ELIQUIS) 5 MG TABS TABLET    Take 1 tablet (5 mg total) by mouth 2 (two) times daily.   CALCIUM-VITAMIN D  (CALCIUM 500+D) 500-400 MG-UNIT PER TABLET    Take 1 tablet by mouth 2 (two) times daily.   DEXTROMETHORPHAN-GUAIFENESIN (MUCINEX DM) 30-600 MG 12HR TABLET    Take 1 tablet by mouth 2 (two) times daily. Take for 1 week   DOCUSATE CALCIUM (STOOL SOFTENER PO)    Take 100 mg by mouth daily as needed (For constipation.). Colace   FERROUS SULFATE 325 (65 FE) MG TABLET    Take 325 mg by mouth daily with supper.    FUROSEMIDE (LASIX) 40 MG TABLET    Take 40 mg by mouth 2 (two) times daily.    HYDROXYZINE (ATARAX/VISTARIL) 25 MG TABLET    Take 25 mg by mouth every 8 (eight) hours as needed for itching.    INSULIN ASPART (NOVOLOG) 100 UNIT/ML INJECTION    Inject 0-12 Units into the skin as needed for high blood sugar. 0-150 = 0 units, 151-200 = 2 units, 201-250 = 4 units, 251-300 = 6 units, 301-350 = 8 units, 351-400 = 10 units, 400+ = 12 units and notify NP/MD   INSULIN GLARGINE (LANTUS) 100 UNIT/ML INJECTION    Inject 0.2 mLs (20 Units total) into the skin at bedtime.   IPRATROPIUM-ALBUTEROL (DUONEB) 0.5-2.5 (3) MG/3ML SOLN    Take 3 mLs by nebulization. Every 6 hours while awake x5 days, then q6h prn for dyspnea and wheezing   LEVETIRACETAM (KEPPRA) 500 MG TABLET    Take 1 tablet (500 mg total) by mouth 2 (two) times daily.   METOPROLOL TARTRATE (LOPRESSOR) 25 MG TABLET    Take 0.5 tablets (12.5 mg total) by mouth 2 (two) times daily.   NAPHAZOLINE-PHENIRAMINE (ALLERGY EYE OP)    Place 1 drop into both eyes as needed (For eye irritation.).   PANTOPRAZOLE (PROTONIX) 40 MG TABLET    Take 1 tablet (40 mg total) by mouth 2 (two) times daily before a meal.   POLYETHYLENE GLYCOL (MIRALAX / GLYCOLAX) PACKET    Take 17 g by mouth daily as needed for mild constipation.   PREGABALIN (LYRICA) 75 MG CAPSULE    Take 75 mg by mouth at bedtime.    ROPINIROLE (REQUIP) 2 MG TABLET    Take 1 tablet (2 mg total) by mouth 3 (three) times daily.   VITAMIN D, ERGOCALCIFEROL, (DRISDOL) 50000 UNITS CAPS CAPSULE    Take 50,000  Units by mouth every Saturday. No specific day    VOLTAREN 1 % GEL    Apply 1 application topically 4 (four) times daily as needed (For knee pain.).   Modified Medications   No medications on file  Discontinued Medications   IPRATROPIUM-ALBUTEROL (DUONEB) 0.5-2.5 (3) MG/3ML SOLN    Take 3  mLs by nebulization every 6 (six) hours as needed.     Allergies  Allergen Reactions  . Erythromycin Other (See Comments)    Vaginal itching  . Penicillins Rash and Other (See Comments)    Has patient had a PCN reaction causing immediate rash, facial/tongue/throat swelling, SOB or lightheadedness with hypotension: No Has patient had a PCN reaction causing severe rash involving mucus membranes or skin necrosis: No Has patient had a PCN reaction that required hospitalization: No Has patient had a PCN reaction occurring within the last 10 years: No If all of the above answers are "NO", then may proceed with Cephalosporin use.  Marland Kitchen Zithromax [Azithromycin] Other (See Comments)    gave her a yeast infection.     REVIEW OF SYSTEMS:  GENERAL: no change in appetite, no fatigue, no weight changes, no fever, chills or weakness EYES: Denies change in vision, dry eyes, eye pain, itching or discharge EARS: Denies change in hearing, ringing in ears, or earache NOSE: Denies nasal congestion or epistaxis MOUTH and THROAT: Denies oral discomfort, gingival pain or bleeding, pain from teeth or hoarseness   RESPIRATORY: no cough, SOB, DOE, wheezing, hemoptysis CARDIAC: no chest pain, or palpitations GI: no abdominal pain, diarrhea, constipation, heart burn, nausea or vomiting GU: Denies dysuria, frequency, hematuria, incontinence, or discharge PSYCHIATRIC: Denies feeling of depression or anxiety. No report of hallucinations, insomnia, paranoia, or agitation    PHYSICAL EXAMINATION  GENERAL APPEARANCE: Well nourished. In no acute distress. Morbidly obese SKIN:  Skin is warm and dry.  HEAD: Normal in size and  contour. No evidence of trauma EYES: Lids open and close normally. No blepharitis, entropion or ectropion. PERRL. Conjunctivae are clear and sclerae are white. Lenses are without opacity EARS: Pinnae are normal. Patient hears normal voice tunes of the examiner MOUTH and THROAT: Lips are without lesions. Oral mucosa is moist and without lesions. Tongue is normal in shape, size, and color and without lesions NECK: supple, trachea midline, no neck masses, no thyroid tenderness, no thyromegaly LYMPHATICS: no LAN in the neck, no supraclavicular LAN RESPIRATORY: breathing is even & unlabored, BS CTAB; on O2 @ 2L/min via [place] CARDIAC: RRR, no murmur,no extra heart sounds, BLE trace edema GI: abdomen soft, normal BS, no masses, no tenderness, no hepatomegaly, no splenomegaly EXTREMITIES:  Able to move BUE and RLE; unable to move LLE; RUE with slight weakness PSYCHIATRIC: Alert and oriented X 3. Affect and behavior are appropriate   LABS/RADIOLOGY: Labs reviewed: Basic Metabolic Panel:  Recent Labs  09/28/15 0535 09/29/15 0459 09/30/15 0341  06/09/16 1802  06/21/16 0344 06/22/16 0413 06/23/16 0751 06/25/16  NA 137 137 140  < >  --   < > 139 138 140 141  K 3.8 3.7 3.6  < >  --   < > 3.6 3.6 3.3* 3.9  CL 89* 92* 95*  < >  --   < > 94* 91* 91*  --   CO2 38* 37* 38*  < >  --   < > 38* 39* 42*  --   GLUCOSE 179* 248* 195*  < >  --   < > 221* 271* 167*  --   BUN 29* 38* 38*  < >  --   < > 12 18 17 15   CREATININE 1.12* 1.31* 1.28*  < >  --   < > 1.04* 1.20* 1.12* 1.0  CALCIUM 9.3 9.2 9.1  < >  --   < > 8.9 8.8* 9.1  --  MG 2.2 2.1 2.2  --  1.7  --   --   --   --   --   PHOS 3.1  --   --   --   --   --   --   --   --   --   < > = values in this interval not displayed. Liver Function Tests:  Recent Labs  06/10/16 0411 06/15/16 2250 06/16/16 0418  AST 14* 11* 16  ALT 11* 10* 10*  ALKPHOS 40 45 43  BILITOT 1.2 0.9 0.9  PROT 5.9* 6.1* 6.1*  ALBUMIN 2.9* 3.1* 3.1*    Recent Labs   09/26/15 1638 12/11/15 1831 12/12/15 0050  AMMONIA 36* 51* 26   CBC:  Recent Labs  06/09/16 1421  06/15/16 2250 06/16/16 0418  06/21/16 0344 06/22/16 0413 06/23/16 0751 06/25/16  WBC 5.0  < > 3.8* 3.1*  < > 2.8* 4.1 3.6* 2.6  NEUTROABS 3.5  --  2.4 1.6*  --   --   --   --   --   HGB 13.1  < > 10.7* 10.9*  < > 10.5* 8.4* 10.5* 10.3*  HCT 42.6  < > 35.1* 37.2  < > 35.9* 35.1* 35.3* 34*  MCV 98.6  < > 99.4 100.3*  < > 99.4 98.9 98.6  --   PLT 133*  < > 156 153  < > 132* 149* 152 137*  < > = values in this interval not displayed.  Cardiac Enzymes:  Recent Labs  06/09/16 1802 06/09/16 2232 06/15/16 2250  TROPONINI 0.03* <0.03 <0.03   CBG:  Recent Labs  06/23/16 0727 06/23/16 1106 06/23/16 1710  GLUCAP 158* 280* 260*      Ct Head Wo Contrast  Result Date: 06/16/2016 CLINICAL DATA:  Altered mental status EXAM: CT HEAD WITHOUT CONTRAST TECHNIQUE: Contiguous axial images were obtained from the base of the skull through the vertex without intravenous contrast. COMPARISON:  12/11/2015 CT FINDINGS: Brain: Mild sulcal prominence bilaterally consistent superficial atrophy. Minimal small vessel ischemic disease of periventricular white matter. No large vascular territory infarction, hemorrhage or midline shift. Left basal ganglial lacunar infarcts appear chronic. Fourth ventricle is midline. No extra-axial fluid collections. Vascular: Moderate atherosclerosis of the carotid siphons. No hyperdense vessels. Skull: Normal. Negative for fracture or focal lesion. Osteopenic appearance of the bony calvarium. Sinuses/Orbits: Inferior mastoid air cells are partially opacified consistent with a mastoid effusion. Minimal ethmoid sinus mucosal thickening. The visualized sphenoid and maxillary sinuses as well as the frontal sinus appear clear. Other: None IMPRESSION: No acute intracranial abnormality. Chronic small vessel ischemic disease and left-sided basal ganglial lacunar infarcts. Chronic  small mastoid effusion on the right. Moderate atherosclerosis of carotid siphons Electronically Signed   By: Ashley Royalty M.D.   On: 06/16/2016 00:25   Dg Chest Port 1 View  Result Date: 06/15/2016 CLINICAL DATA:  Lethargic tonight. Discharge from hospital for days ago with urinary tract infection and pneumonia. EXAM: PORTABLE CHEST 1 VIEW COMPARISON:  06/09/2016 FINDINGS: Shallow inspiration. Cardiac enlargement with mild pulmonary vascular congestion. Bilateral perihilar infiltrates are progressing since previous study and may indicate edema or progressing pneumonia. Small bilateral pleural effusions. Calcified and tortuous aorta. No pneumothorax. IMPRESSION: Progression of perihilar infiltrates may represent developing edema or progressing pneumonia. Small pleural effusions. Electronically Signed   By: Lucienne Capers M.D.   On: 06/15/2016 23:19    ASSESSMENT/PLAN:  Physical deconditioning - for Home health PT and OT, for therapeutic strengthening exercises; fall  precautions  Chronic hypercapnic respiratory failure -  continue O2 at 2 L/minute via [place]  Community acquired pneumonia - completed course of antibiotics; resolved  UTI - resolved  Acute kidney injury - resolved Lab Results  Component Value Date   CREATININE 1.0 06/25/2016    Hypokalemia -  for [facility] 07/10/16 Lab Results  Component Value Date   K 3.9 06/25/2016   Chronic atrial fibrillation - rate controlled; continue metoprolol tartrate 25 mg 1/2 tab = 12.5 mg PO BID and Eliquis 5 mg 1 tab by mouth twice a day  Diabetes mellitus, type II - continue Lantus 100 units/mL 20 units subcutaneous daily at bedtime and NovoLog 100 units/mL sliding scale subcutaneous ACHS; CBG ACHS Lab Results  Component Value Date   HGBA1C 9.4 (H) 06/09/2016    History of CVA with left  hemiparesis - continue Eliquis 5 mg 1 tab by mouth twice a day  Chronic diastolic heart failure - continue O2 at 2 L/minute via [employer]; continue Lasix 40 mg 1 tab by  mouth twice a day; weigh Q Mondays-Wednesdays-Fridays  Seizure disorder - continue Keppra 500 mg 1 tab by mouth twice a day  Restless leg syndrome - continue Lyrica 75 mg 1 capsule by mouth daily at bedtime and Requip 2 mg 1 tab by mouth 3 times a day  Anemia of chronic disease - continue ferrous sulfate 325 mg 1 tab by mouth daily Lab Results  Component Value Date   HGB 10.3 (A) 06/25/2016       I have filled out patient's discharge paperwork and written prescriptions.  Patient will receive home health PT, OT, SW, Nursing and CNA.  DME provided:  None  Total discharge time: Greater than 30 minutes Greater than 50% was spent in counseling and coordination of care with the patient.   Discharge time involved coordination of the discharge process with social worker, nursing staff and therapy department. Medical justification for home health services verified.   Makynzi Eastland C. [place] - NP    [employer] [phone]

## 2016-07-13 ENCOUNTER — Telehealth: Payer: Self-pay | Admitting: Adult Health

## 2016-07-13 ENCOUNTER — Inpatient Hospital Stay (HOSPITAL_COMMUNITY)
Admission: EM | Admit: 2016-07-13 | Discharge: 2016-07-21 | DRG: 208 | Disposition: A | Discharge status: Home Health | Payer: Medicare Other | Attending: Internal Medicine | Admitting: Internal Medicine

## 2016-07-13 ENCOUNTER — Encounter (HOSPITAL_COMMUNITY): Payer: Self-pay | Admitting: Emergency Medicine

## 2016-07-13 ENCOUNTER — Emergency Department (HOSPITAL_COMMUNITY): Payer: Medicare Other

## 2016-07-13 DIAGNOSIS — E118 Type 2 diabetes mellitus with unspecified complications: Secondary | ICD-10-CM | POA: Diagnosis present

## 2016-07-13 DIAGNOSIS — J9601 Acute respiratory failure with hypoxia: Secondary | ICD-10-CM | POA: Diagnosis present

## 2016-07-13 DIAGNOSIS — Z823 Family history of stroke: Secondary | ICD-10-CM

## 2016-07-13 DIAGNOSIS — G2581 Restless legs syndrome: Secondary | ICD-10-CM | POA: Diagnosis present

## 2016-07-13 DIAGNOSIS — I5032 Chronic diastolic (congestive) heart failure: Secondary | ICD-10-CM | POA: Diagnosis not present

## 2016-07-13 DIAGNOSIS — K219 Gastro-esophageal reflux disease without esophagitis: Secondary | ICD-10-CM | POA: Diagnosis present

## 2016-07-13 DIAGNOSIS — G9349 Other encephalopathy: Secondary | ICD-10-CM | POA: Diagnosis present

## 2016-07-13 DIAGNOSIS — J189 Pneumonia, unspecified organism: Secondary | ICD-10-CM | POA: Diagnosis present

## 2016-07-13 DIAGNOSIS — Z794 Long term (current) use of insulin: Secondary | ICD-10-CM

## 2016-07-13 DIAGNOSIS — G9340 Encephalopathy, unspecified: Secondary | ICD-10-CM | POA: Diagnosis present

## 2016-07-13 DIAGNOSIS — Z6841 Body Mass Index (BMI) 40.0 and over, adult: Secondary | ICD-10-CM | POA: Diagnosis not present

## 2016-07-13 DIAGNOSIS — I482 Chronic atrial fibrillation, unspecified: Secondary | ICD-10-CM | POA: Diagnosis present

## 2016-07-13 DIAGNOSIS — I69354 Hemiplegia and hemiparesis following cerebral infarction affecting left non-dominant side: Secondary | ICD-10-CM

## 2016-07-13 DIAGNOSIS — J9602 Acute respiratory failure with hypercapnia: Secondary | ICD-10-CM | POA: Diagnosis not present

## 2016-07-13 DIAGNOSIS — I69359 Hemiplegia and hemiparesis following cerebral infarction affecting unspecified side: Secondary | ICD-10-CM

## 2016-07-13 DIAGNOSIS — IMO0002 Reserved for concepts with insufficient information to code with codable children: Secondary | ICD-10-CM | POA: Diagnosis present

## 2016-07-13 DIAGNOSIS — Y95 Nosocomial condition: Secondary | ICD-10-CM | POA: Diagnosis present

## 2016-07-13 DIAGNOSIS — I447 Left bundle-branch block, unspecified: Secondary | ICD-10-CM | POA: Diagnosis present

## 2016-07-13 DIAGNOSIS — I110 Hypertensive heart disease with heart failure: Secondary | ICD-10-CM | POA: Diagnosis present

## 2016-07-13 DIAGNOSIS — J9621 Acute and chronic respiratory failure with hypoxia: Secondary | ICD-10-CM | POA: Diagnosis present

## 2016-07-13 DIAGNOSIS — E662 Morbid (severe) obesity with alveolar hypoventilation: Principal | ICD-10-CM | POA: Diagnosis present

## 2016-07-13 DIAGNOSIS — I480 Paroxysmal atrial fibrillation: Secondary | ICD-10-CM | POA: Diagnosis present

## 2016-07-13 DIAGNOSIS — J9622 Acute and chronic respiratory failure with hypercapnia: Secondary | ICD-10-CM | POA: Diagnosis present

## 2016-07-13 DIAGNOSIS — Z79899 Other long term (current) drug therapy: Secondary | ICD-10-CM

## 2016-07-13 DIAGNOSIS — Z87891 Personal history of nicotine dependence: Secondary | ICD-10-CM | POA: Diagnosis not present

## 2016-07-13 DIAGNOSIS — E872 Acidosis: Secondary | ICD-10-CM | POA: Diagnosis present

## 2016-07-13 DIAGNOSIS — G4730 Sleep apnea, unspecified: Secondary | ICD-10-CM | POA: Diagnosis present

## 2016-07-13 DIAGNOSIS — I5042 Chronic combined systolic (congestive) and diastolic (congestive) heart failure: Secondary | ICD-10-CM | POA: Diagnosis present

## 2016-07-13 DIAGNOSIS — J9612 Chronic respiratory failure with hypercapnia: Secondary | ICD-10-CM

## 2016-07-13 DIAGNOSIS — J9611 Chronic respiratory failure with hypoxia: Secondary | ICD-10-CM | POA: Diagnosis present

## 2016-07-13 DIAGNOSIS — Z7901 Long term (current) use of anticoagulants: Secondary | ICD-10-CM

## 2016-07-13 DIAGNOSIS — I5043 Acute on chronic combined systolic (congestive) and diastolic (congestive) heart failure: Secondary | ICD-10-CM | POA: Diagnosis present

## 2016-07-13 DIAGNOSIS — G40909 Epilepsy, unspecified, not intractable, without status epilepticus: Secondary | ICD-10-CM | POA: Diagnosis present

## 2016-07-13 DIAGNOSIS — R600 Localized edema: Secondary | ICD-10-CM | POA: Diagnosis not present

## 2016-07-13 DIAGNOSIS — E8770 Fluid overload, unspecified: Secondary | ICD-10-CM | POA: Diagnosis present

## 2016-07-13 DIAGNOSIS — Z8249 Family history of ischemic heart disease and other diseases of the circulatory system: Secondary | ICD-10-CM

## 2016-07-13 DIAGNOSIS — Z880 Allergy status to penicillin: Secondary | ICD-10-CM

## 2016-07-13 DIAGNOSIS — E1165 Type 2 diabetes mellitus with hyperglycemia: Secondary | ICD-10-CM | POA: Diagnosis present

## 2016-07-13 DIAGNOSIS — Z9119 Patient's noncompliance with other medical treatment and regimen: Secondary | ICD-10-CM

## 2016-07-13 DIAGNOSIS — Z881 Allergy status to other antibiotic agents status: Secondary | ICD-10-CM

## 2016-07-13 DIAGNOSIS — Z9981 Dependence on supplemental oxygen: Secondary | ICD-10-CM

## 2016-07-13 LAB — URINALYSIS, ROUTINE W REFLEX MICROSCOPIC
BACTERIA UA: NONE SEEN
Bilirubin Urine: NEGATIVE
Glucose, UA: >=500 mg/dL — AB
Ketones, ur: NEGATIVE mg/dL
Nitrite: NEGATIVE
PROTEIN: NEGATIVE mg/dL
SPECIFIC GRAVITY, URINE: 1.008 (ref 1.005–1.030)
SQUAMOUS EPITHELIAL / LPF: NONE SEEN
pH: 5 (ref 5.0–8.0)

## 2016-07-13 LAB — COMPREHENSIVE METABOLIC PANEL
ALBUMIN: 3.4 g/dL — AB (ref 3.5–5.0)
ALT: 10 U/L — ABNORMAL LOW (ref 14–54)
AST: 17 U/L (ref 15–41)
Alkaline Phosphatase: 47 U/L (ref 38–126)
Anion gap: 8 (ref 5–15)
BUN: 23 mg/dL — AB (ref 6–20)
CHLORIDE: 98 mmol/L — AB (ref 101–111)
CO2: 34 mmol/L — AB (ref 22–32)
Calcium: 8.7 mg/dL — ABNORMAL LOW (ref 8.9–10.3)
Creatinine, Ser: 1.26 mg/dL — ABNORMAL HIGH (ref 0.44–1.00)
GFR calc Af Amer: 43 mL/min — ABNORMAL LOW (ref >60)
GFR calc non Af Amer: 37 mL/min — ABNORMAL LOW (ref >60)
GLUCOSE: 394 mg/dL — AB (ref 65–99)
POTASSIUM: 4.9 mmol/L (ref 3.5–5.1)
Sodium: 140 mmol/L (ref 135–145)
Total Bilirubin: 1.1 mg/dL (ref 0.3–1.2)
Total Protein: 6.6 g/dL (ref 6.5–8.1)

## 2016-07-13 LAB — CBC WITH DIFFERENTIAL/PLATELET
Basophils Absolute: 0 10*3/uL (ref 0.0–0.1)
Basophils Relative: 0 %
EOS PCT: 0 %
Eosinophils Absolute: 0 10*3/uL (ref 0.0–0.7)
HCT: 37.6 % (ref 36.0–46.0)
Hemoglobin: 11.6 g/dL — ABNORMAL LOW (ref 12.0–15.0)
LYMPHS ABS: 0.6 10*3/uL — AB (ref 0.7–4.0)
Lymphocytes Relative: 9 %
MCH: 30.7 pg (ref 26.0–34.0)
MCHC: 30.9 g/dL (ref 30.0–36.0)
MCV: 99.5 fL (ref 78.0–100.0)
MONO ABS: 0.3 10*3/uL (ref 0.1–1.0)
Monocytes Relative: 4 %
Neutro Abs: 5.9 10*3/uL (ref 1.7–7.7)
Neutrophils Relative %: 87 %
PLATELETS: 111 10*3/uL — AB (ref 150–400)
RBC: 3.78 MIL/uL — ABNORMAL LOW (ref 3.87–5.11)
RDW: 14.4 % (ref 11.5–15.5)
WBC: 6.8 10*3/uL (ref 4.0–10.5)

## 2016-07-13 LAB — I-STAT ARTERIAL BLOOD GAS, ED
ACID-BASE EXCESS: 9 mmol/L — AB (ref 0.0–2.0)
BICARBONATE: 38.1 mmol/L — AB (ref 20.0–28.0)
O2 SAT: 99 %
TCO2: 40 mmol/L (ref 0–100)
pCO2 arterial: 80.7 mmHg (ref 32.0–48.0)
pH, Arterial: 7.281 — ABNORMAL LOW (ref 7.350–7.450)
pO2, Arterial: 189 mmHg — ABNORMAL HIGH (ref 83.0–108.0)

## 2016-07-13 LAB — BLOOD GAS, ARTERIAL
ACID-BASE EXCESS: 10 mmol/L — AB (ref 0.0–2.0)
Bicarbonate: 36.3 mmol/L — ABNORMAL HIGH (ref 20.0–28.0)
DELIVERY SYSTEMS: POSITIVE
Drawn by: 441661
FIO2: 0.5
O2 SAT: 98.6 %
PATIENT TEMPERATURE: 98.6
PEEP/CPAP: 8 cmH2O
PH ART: 7.311 — AB (ref 7.350–7.450)
PO2 ART: 142 mmHg — AB (ref 83.0–108.0)
PRESSURE CONTROL: 8 cmH2O
RATE: 12 resp/min
pCO2 arterial: 74.2 mmHg (ref 32.0–48.0)

## 2016-07-13 LAB — I-STAT TROPONIN, ED: TROPONIN I, POC: 0.01 ng/mL (ref 0.00–0.08)

## 2016-07-13 LAB — GLUCOSE, CAPILLARY
GLUCOSE-CAPILLARY: 255 mg/dL — AB (ref 65–99)
Glucose-Capillary: 320 mg/dL — ABNORMAL HIGH (ref 65–99)

## 2016-07-13 LAB — I-STAT CG4 LACTIC ACID, ED
LACTIC ACID, VENOUS: 1.65 mmol/L (ref 0.5–1.9)
Lactic Acid, Venous: 2.37 mmol/L (ref 0.5–1.9)

## 2016-07-13 LAB — INFLUENZA PANEL BY PCR (TYPE A & B)
INFLBPCR: NEGATIVE
Influenza A By PCR: NEGATIVE

## 2016-07-13 LAB — LACTIC ACID, PLASMA: Lactic Acid, Venous: 2.1 mmol/L (ref 0.5–1.9)

## 2016-07-13 LAB — BRAIN NATRIURETIC PEPTIDE: B NATRIURETIC PEPTIDE 5: 253.3 pg/mL — AB (ref 0.0–100.0)

## 2016-07-13 MED ORDER — SODIUM CHLORIDE 0.9 % IV BOLUS (SEPSIS)
1000.0000 mL | Freq: Once | INTRAVENOUS | Status: AC
  Administered 2016-07-13: 1000 mL via INTRAVENOUS

## 2016-07-13 MED ORDER — SODIUM CHLORIDE 0.9 % IV SOLN
2000.0000 mg | Freq: Once | INTRAVENOUS | Status: AC
  Administered 2016-07-13: 2000 mg via INTRAVENOUS
  Filled 2016-07-13: qty 2000

## 2016-07-13 MED ORDER — SODIUM CHLORIDE 0.9% FLUSH
3.0000 mL | Freq: Two times a day (BID) | INTRAVENOUS | Status: DC
  Administered 2016-07-13 – 2016-07-15 (×4): 3 mL via INTRAVENOUS

## 2016-07-13 MED ORDER — SODIUM CHLORIDE 0.9 % IV BOLUS (SEPSIS)
500.0000 mL | Freq: Once | INTRAVENOUS | Status: AC
  Administered 2016-07-13: 500 mL via INTRAVENOUS

## 2016-07-13 MED ORDER — FUROSEMIDE 40 MG PO TABS
40.0000 mg | ORAL_TABLET | Freq: Two times a day (BID) | ORAL | Status: DC
  Administered 2016-07-14 – 2016-07-15 (×3): 40 mg via ORAL
  Filled 2016-07-13 (×3): qty 1

## 2016-07-13 MED ORDER — ONDANSETRON HCL 4 MG PO TABS: 4.0000 mg | ORAL_TABLET | Freq: Four times a day (QID) | ORAL | Status: DC | PRN

## 2016-07-13 MED ORDER — INSULIN GLARGINE 100 UNIT/ML ~~LOC~~ SOLN
8.0000 [IU] | Freq: Every day | SUBCUTANEOUS | Status: DC
  Administered 2016-07-13 – 2016-07-14 (×2): 8 [IU] via SUBCUTANEOUS
  Filled 2016-07-13 (×2): qty 0.08

## 2016-07-13 MED ORDER — ACETAMINOPHEN 325 MG PO TABS
650.0000 mg | ORAL_TABLET | Freq: Four times a day (QID) | ORAL | Status: DC | PRN
  Administered 2016-07-19 – 2016-07-20 (×2): 650 mg via ORAL
  Filled 2016-07-13 (×2): qty 2

## 2016-07-13 MED ORDER — DEXTROSE 5 % IV SOLN
2.0000 g | INTRAVENOUS | Status: DC
  Administered 2016-07-14: 2 g via INTRAVENOUS
  Filled 2016-07-13 (×2): qty 2

## 2016-07-13 MED ORDER — ONDANSETRON HCL 4 MG/2ML IJ SOLN: 4.0000 mg | Freq: Four times a day (QID) | INTRAMUSCULAR | Status: DC | PRN

## 2016-07-13 MED ORDER — FUROSEMIDE 10 MG/ML IJ SOLN: 80.0000 mg | Freq: Once | INTRAMUSCULAR | Status: DC

## 2016-07-13 MED ORDER — SODIUM CHLORIDE 0.9% FLUSH: 3.0000 mL | INTRAVENOUS | Status: DC | PRN

## 2016-07-13 MED ORDER — SODIUM CHLORIDE 0.9% FLUSH
3.0000 mL | Freq: Two times a day (BID) | INTRAVENOUS | Status: DC
  Administered 2016-07-13 – 2016-07-21 (×14): 3 mL via INTRAVENOUS

## 2016-07-13 MED ORDER — ACETAMINOPHEN 650 MG RE SUPP: 650.0000 mg | Freq: Four times a day (QID) | RECTAL | Status: DC | PRN

## 2016-07-13 MED ORDER — CEFEPIME HCL 2 G IJ SOLR
2.0000 g | Freq: Once | INTRAMUSCULAR | Status: AC
  Administered 2016-07-13: 2 g via INTRAVENOUS
  Filled 2016-07-13: qty 2

## 2016-07-13 MED ORDER — VANCOMYCIN HCL 10 G IV SOLR
1500.0000 mg | INTRAVENOUS | Status: DC
  Administered 2016-07-14: 1500 mg via INTRAVENOUS
  Filled 2016-07-13 (×2): qty 1500

## 2016-07-13 MED ORDER — LEVETIRACETAM 500 MG/5ML IV SOLN
500.0000 mg | Freq: Once | INTRAVENOUS | Status: AC
  Administered 2016-07-13: 500 mg via INTRAVENOUS
  Filled 2016-07-13: qty 5

## 2016-07-13 MED ORDER — INSULIN ASPART 100 UNIT/ML ~~LOC~~ SOLN
0.0000 [IU] | SUBCUTANEOUS | Status: DC
  Administered 2016-07-13: 11 [IU] via SUBCUTANEOUS
  Administered 2016-07-13: 15 [IU] via SUBCUTANEOUS
  Administered 2016-07-14: 3 [IU] via SUBCUTANEOUS
  Administered 2016-07-14: 4 [IU] via SUBCUTANEOUS
  Administered 2016-07-14: 11 [IU] via SUBCUTANEOUS
  Administered 2016-07-15 (×2): 4 [IU] via SUBCUTANEOUS
  Administered 2016-07-15: 3 [IU] via SUBCUTANEOUS

## 2016-07-13 MED ORDER — LEVETIRACETAM 500 MG PO TABS
500.0000 mg | ORAL_TABLET | Freq: Two times a day (BID) | ORAL | Status: DC
Start: 2016-07-14 — End: 2016-07-21
  Administered 2016-07-14 – 2016-07-21 (×15): 500 mg via ORAL
  Filled 2016-07-13 (×15): qty 1

## 2016-07-13 MED ORDER — FUROSEMIDE 10 MG/ML IJ SOLN
40.0000 mg | Freq: Once | INTRAMUSCULAR | Status: AC
  Administered 2016-07-13: 40 mg via INTRAVENOUS
  Filled 2016-07-13: qty 4

## 2016-07-13 MED ORDER — SODIUM CHLORIDE 0.9 % IV SOLN: 250.0000 mL | INTRAVENOUS | Status: DC | PRN

## 2016-07-13 MED ORDER — ROPINIROLE HCL 1 MG PO TABS
2.0000 mg | ORAL_TABLET | Freq: Three times a day (TID) | ORAL | Status: DC
  Administered 2016-07-14 – 2016-07-21 (×23): 2 mg via ORAL
  Filled 2016-07-13 (×24): qty 2

## 2016-07-13 MED ORDER — METOPROLOL TARTRATE 12.5 MG HALF TABLET
12.5000 mg | ORAL_TABLET | Freq: Two times a day (BID) | ORAL | Status: DC
  Administered 2016-07-14 – 2016-07-21 (×15): 12.5 mg via ORAL
  Filled 2016-07-13 (×15): qty 1

## 2016-07-13 MED ORDER — APIXABAN 5 MG PO TABS
5.0000 mg | ORAL_TABLET | Freq: Two times a day (BID) | ORAL | Status: DC
  Administered 2016-07-14 – 2016-07-21 (×15): 5 mg via ORAL
  Filled 2016-07-13 (×15): qty 1

## 2016-07-13 MED ORDER — METOPROLOL TARTRATE 5 MG/5ML IV SOLN
2.5000 mg | Freq: Four times a day (QID) | INTRAVENOUS | Status: AC
  Administered 2016-07-13 – 2016-07-14 (×3): 2.5 mg via INTRAVENOUS
  Filled 2016-07-13 (×3): qty 5

## 2016-07-13 NOTE — ED Notes (Signed)
Paged MD [name] about Lactic Acid.

## 2016-07-13 NOTE — Progress Notes (Signed)
CRITICAL ABG VALUE REPORTED TO RN.

## 2016-07-13 NOTE — ED Notes (Signed)
[facility] is unable to take Vent patient. Calling patient placement

## 2016-07-13 NOTE — ED Provider Notes (Signed)
[facility] DEPT Provider Note   CSN: [id number] Arrival date & time: 07/13/16  1446     History   Chief Complaint Chief Complaint  Patient presents with  . Shortness of Breath  . Altered Mental Status    HPI [name] is a 83 y.o. female.  HPI  83 year old female with history of paroxysmal atrial fibrillation on eliquis, prior CVA, diastolic heart failure, recent admission to the hospital for sepsis secondary to pneumonia and UTI, discharged to a skilled nursing facility and then back to her home yesterday, who presents after her family found her lethargic and confused with increased work of breathing. Patient was reportedly satting in the high 80s on her home 4 L of oxygen. Appear to have increased work of breathing and decreased level of consciousness. Per family members, she was acting completely normally earlier in the day.  On arrival the patient awakens to voice. Denies pain anywhere. Endorses that she had some increased work of breathing earlier but feels better on a nonrebreather. She is confused and falls asleep in between questioning, but is able to tell me where she is and the year. Further history limited by altered mental status.  Past Medical History:  Diagnosis Date  . Acute delirium 04/12/2011  . Acute kidney injury ([place]) 09/02/2015  . Anemia   . Anxiety   . Arthritis   . Atrial fibrillation ([place])   . Blood transfusion   . Bronchitis   . CHF (congestive heart failure) ([place])   . Diabetes mellitus   . GERD (gastroesophageal reflux disease)   . GI (gastrointestinal bleed)    Due to benign mass  . Hypertension   . Lobar pneumonia due to unspecified organism 09/02/2015  . On home oxygen therapy    "4L; 24/7" (12/11/2015)  . Restless leg syndrome   . Seizures ([place])   . Stroke [facility])     Patient Active Problem List   Diagnosis Date Noted  . Acute respiratory failure with hypercapnia ([place]) 07/13/2016  . Acute respiratory failure with hypoxia and  hypercapnia ([place]) 06/16/2016  . Urinary tract infection without hematuria 06/16/2016  . HCAP (healthcare-associated pneumonia) 06/16/2016  . Acute respiratory failure ([place]) 06/16/2016  . CAP (community acquired pneumonia) 06/09/2016  . Cellulitis 12/16/2015  . Confusion 12/11/2015  . SIRS (systemic inflammatory response syndrome) ([place]) 12/11/2015  . Chronic respiratory failure with hypoxia and hypercapnia ([place]) 09/28/2015  . Acute encephalopathy   . Acute on chronic congestive heart failure ([place])   . Needs sleep apnea assessment   . Acute on chronic diastolic (congestive) heart failure 09/22/2015  . Acute kidney injury ([place]) 09/02/2015  . Lobar pneumonia due to unspecified organism 09/02/2015  . Bilateral leg edema   . Acute respiratory failure with hypoxia ([place]) 08/29/2015  . Acute bronchitis 08/29/2015  . Acute hyperkalemia 08/29/2015  . History of pulmonary embolism 01/27/2015  . Chest pain at rest 01/27/2015  . Shortness of breath at rest 01/27/2015  . Chronic diastolic heart failure ([place]) 01/27/2015  . Thrombocytopenia ([place]) 01/27/2015  . Abnormal findings on radiological examination of gastrointestinal tract 01/27/2015  . Chronic atrial fibrillation ([place])   . Seizure disorder ([place])   . Anxiety   . Anemia   . Restless leg syndrome   . Acute delirium 04/12/2011  . DM (diabetes mellitus), type 2, uncontrolled with complications ([place]) 59/16/3846  . CVA, old, hemiparesis ([place]) 03/25/2011  . Hypertension 03/25/2011    Past Surgical History:  Procedure Laterality Date  . CATARACT EXTRACTION  W/ INTRAOCULAR LENS IMPLANT     "Dr. Gershon Crane; think they did the right one"  . EUS  09/24/2011   Procedure: UPPER ENDOSCOPIC ULTRASOUND (EUS) LINEAR;  Surgeon: Beryle Beams, MD;  Location: [facility] ENDOSCOPY;  Service: Endoscopy;  Laterality: N/A;  . TONSILLECTOMY  1935  . TUBAL LIGATION      OB History    No data available       Home Medications    Prior to Admission medications     Medication Sig Start Date End Date Taking? Authorizing Provider  albuterol (PROVENTIL HFA;VENTOLIN HFA) 108 (90 BASE) MCG/ACT inhaler Inhale 1 puff into the lungs every 6 (six) hours as needed for wheezing or shortness of breath.   Yes Historical Provider, MD  apixaban (ELIQUIS) 5 MG TABS tablet Take 1 tablet (5 mg total) by mouth 2 (two) times daily. 09/27/15  Yes Lavina Hamman, MD  calcium-vitamin D (CALCIUM 500+D) 500-400 MG-UNIT per tablet Take 1 tablet by mouth 2 (two) times daily.   Yes Historical Provider, MD  dextromethorphan-guaiFENesin (MUCINEX DM) 30-600 MG 12hr tablet Take 1 tablet by mouth 2 (two) times daily. Take for 1 week   Yes Historical Provider, MD  Docusate Calcium (STOOL SOFTENER PO) Take 100 mg by mouth daily as needed (For constipation.). Colace   Yes Historical Provider, MD  ferrous sulfate 325 (65 FE) MG tablet Take 325 mg by mouth daily with supper.    Yes Historical Provider, MD  furosemide (LASIX) 40 MG tablet Take 40 mg by mouth 2 (two) times daily.    Yes Historical Provider, MD  hydrOXYzine (ATARAX/VISTARIL) 25 MG tablet Take 25 mg by mouth every 8 (eight) hours as needed for itching.  05/10/16  Yes Historical Provider, MD  insulin aspart (NOVOLOG) 100 UNIT/ML injection Inject 0-12 Units into the skin as needed for high blood sugar. 0-150 = 0 units, 151-200 = 2 units, 201-250 = 4 units, 251-300 = 6 units, 301-350 = 8 units, 351-400 = 10 units, 400+ = 12 units and notify NP/MD   Yes Historical Provider, MD  insulin glargine (LANTUS) 100 UNIT/ML injection Inject 0.2 mLs (20 Units total) into the skin at bedtime. Patient taking differently: Inject 30 Units into the skin at bedtime.  06/23/16  Yes Theodis Blaze, MD  ipratropium-albuterol (DUONEB) 0.5-2.5 (3) MG/3ML SOLN Take 3 mLs by nebulization. Every 6 hours while awake x5 days, then q6h prn for dyspnea and wheezing   Yes Historical Provider, MD  levETIRAcetam (KEPPRA) 500 MG tablet Take 1 tablet (500 mg total) by mouth 2  (two) times daily. 02/27/12  Yes Monika Salk, MD  metoprolol tartrate (LOPRESSOR) 25 MG tablet Take 0.5 tablets (12.5 mg total) by mouth 2 (two) times daily. 06/23/16  Yes Theodis Blaze, MD  Naphazoline-Pheniramine (ALLERGY EYE OP) Place 1 drop into both eyes as needed (For eye irritation.).   Yes Historical Provider, MD  pantoprazole (PROTONIX) 40 MG tablet Take 1 tablet (40 mg total) by mouth 2 (two) times daily before a meal. 10/01/15  Yes Lavina Hamman, MD  polyethylene glycol (MIRALAX / GLYCOLAX) packet Take 17 g by mouth daily as needed for mild constipation. 06/11/16  Yes Mariel Aloe, MD  pregabalin (LYRICA) 75 MG capsule Take 75 mg by mouth at bedtime.    Yes Historical Provider, MD  rOPINIRole (REQUIP) 2 MG tablet Take 1 tablet (2 mg total) by mouth 3 (three) times daily. 09/27/15  Yes Lavina Hamman, MD  Vitamin D,  Ergocalciferol, (DRISDOL) 50000 units CAPS capsule Take 50,000 Units by mouth every Saturday. No specific day    Yes Historical Provider, MD  VOLTAREN 1 % GEL Apply 1 application topically 4 (four) times daily as needed (For knee pain.).  10/26/11  Yes Historical Provider, MD    Family History Family History  Problem Relation Age of Onset  . Aneurysm Mother   . Heart attack Father   . Heart failure Sister   . Asthma Sister   . Kidney failure Sister   . Stroke Sister     Social History Social History  Substance Use Topics  . Smoking status: Former Smoker    Packs/day: 1.00    Years: 40.00    Types: Cigarettes    Quit date: 09/21/1985  . Smokeless tobacco: Never Used  . Alcohol use No     Allergies   Erythromycin; Penicillins; and Zithromax [azithromycin]  ROS Severely limited by somnolence Review of Systems Review of Systems  Constitutional: Negative for chills.  HENT: Negative for congestion.   Eyes: Negative for visual disturbance.  Respiratory: Positive for cough and shortness of breath.   Cardiovascular: Positive for leg swelling. Negative for chest  pain.  Gastrointestinal: Negative for abdominal pain, diarrhea, nausea and vomiting.  Neurological: Negative for headaches.  Psychiatric/Behavioral: Positive for confusion.     Physical Exam Updated Vital Signs BP 120/71   Pulse 85   Temp 98.4 F (36.9 C) (Rectal)   Resp 12   Ht 5\' 6"  (1.676 m)   Wt 130.5 kg   SpO2 99%   BMI 46.45 kg/m   Physical Exam  Constitutional:  Morbidly obese, somnolent but awakens to voice  HENT:  Head: Normocephalic and atraumatic.  Right Ear: External ear normal.  Left Ear: External ear normal.  Mouth/Throat: Oropharynx is clear and moist.  Eyes: EOM are normal. Pupils are equal, round, and reactive to light.  Neck: Normal range of motion. Neck supple.  Cardiovascular: Normal rate.   No murmur heard. afib with RVR, HR 120  Pulmonary/Chest:  Mild increased work of breathing. Course rales throughout all lung fields, with scattered rhonchi. No wheezing  Abdominal: Soft. Bowel sounds are normal. There is no tenderness. There is no guarding.  Musculoskeletal:  4+ pitting edema to the bilateral LEs, with overlying erythema and warmth  Neurological:  A&Ox3, somewhat confused about recent events. Somnolent, but awakens to voice  Skin: Skin is warm and dry.     ED Treatments / Results  Labs (all labs ordered are listed, but only abnormal results are displayed) Labs Reviewed  COMPREHENSIVE METABOLIC PANEL - Abnormal; Notable for the following:       Result Value   Chloride 98 (*)    CO2 34 (*)    Glucose, Bld 394 (*)    BUN 23 (*)    Creatinine, Ser 1.26 (*)    Calcium 8.7 (*)    Albumin 3.4 (*)    ALT 10 (*)    GFR calc non Af Amer 37 (*)    GFR calc Af Amer 43 (*)    All other components within normal limits  CBC WITH DIFFERENTIAL/PLATELET - Abnormal; Notable for the following:    RBC 3.78 (*)    Hemoglobin 11.6 (*)    Platelets 111 (*)    Lymphs Abs 0.6 (*)    All other components within normal limits  URINALYSIS, ROUTINE W  REFLEX MICROSCOPIC - Abnormal; Notable for the following:    Glucose, UA >=500 (*)  Hgb urine dipstick SMALL (*)    Leukocytes, UA TRACE (*)    All other components within normal limits  BRAIN NATRIURETIC PEPTIDE - Abnormal; Notable for the following:    B Natriuretic Peptide 253.3 (*)    All other components within normal limits  BLOOD GAS, ARTERIAL - Abnormal; Notable for the following:    pH, Arterial 7.311 (*)    pCO2 arterial 74.2 (*)    pO2, Arterial 142 (*)    Bicarbonate 36.3 (*)    Acid-Base Excess 10.0 (*)    All other components within normal limits  LACTIC ACID, PLASMA - Abnormal; Notable for the following:    Lactic Acid, Venous 2.1 (*)    All other components within normal limits  GLUCOSE, CAPILLARY - Abnormal; Notable for the following:    Glucose-Capillary 320 (*)    All other components within normal limits  GLUCOSE, CAPILLARY - Abnormal; Notable for the following:    Glucose-Capillary 255 (*)    All other components within normal limits  I-STAT ARTERIAL BLOOD GAS, ED - Abnormal; Notable for the following:    pH, Arterial 7.281 (*)    pCO2 arterial 80.7 (*)    pO2, Arterial 189.0 (*)    Bicarbonate 38.1 (*)    Acid-Base Excess 9.0 (*)    All other components within normal limits  I-STAT CG4 LACTIC ACID, ED - Abnormal; Notable for the following:    Lactic Acid, Venous 2.37 (*)    All other components within normal limits  CULTURE, BLOOD (ROUTINE X 2)  CULTURE, BLOOD (ROUTINE X 2)  URINE CULTURE  MRSA PCR SCREENING  INFLUENZA PANEL BY PCR (TYPE A & B)  BASIC METABOLIC PANEL  CBC  I-STAT CG4 LACTIC ACID, ED  I-STAT TROPOININ, ED    EKG  EKG Interpretation  Date/Time:  Monday July 13 2016 14:56:43 EDT Ventricular Rate:  119 PR Interval:    QRS Duration: 141 QT Interval:  367 QTC Calculation: 517 R Axis:   -19 Text Interpretation:  Atrial fibrillation Left bundle branch block Baseline wander in lead(s) V6 No significant change from 06/15/2016  Confirmed by DELO  MD, DOUGLAS ([id number]) on 07/13/2016 3:08:04 PM       Radiology Dg Chest Port 1 View  Result Date: 07/13/2016 CLINICAL DATA:  Altered mental status. EXAM: PORTABLE CHEST 1 VIEW COMPARISON:  June 15, 2016 FINDINGS: Cardiomegaly. The hila and mediastinum are stable. No pneumothorax. Small bilateral pleural effusions, right greater than left. Opacity in the left base has decreased in the interval. There is still mild patchy opacity in the right mid and lower lung. IMPRESSION: Small bilateral pleural effusions, right greater than left. Interval resolution of left-sided opacity. Mild patchy opacity in the right mid and lower lung. Electronically Signed   By: Dorise Bullion III M.D   On: 07/13/2016 15:22    Procedures Procedures (including critical care time)  Medications Ordered in ED Medications  apixaban (ELIQUIS) tablet 5 mg (not administered)  rOPINIRole (REQUIP) tablet 2 mg (not administered)  levETIRAcetam (KEPPRA) tablet 500 mg (not administered)  metoprolol tartrate (LOPRESSOR) tablet 12.5 mg (not administered)  metoprolol (LOPRESSOR) injection 2.5 mg (2.5 mg Intravenous Given 07/13/16 2317)  sodium chloride flush (NS) 0.9 % injection 3 mL (3 mLs Intravenous Given 07/13/16 2110)  sodium chloride flush (NS) 0.9 % injection 3 mL (3 mLs Intravenous Given 07/13/16 2110)  sodium chloride flush (NS) 0.9 % injection 3 mL (not administered)  0.9 %  sodium chloride infusion (not administered)  acetaminophen (TYLENOL) tablet 650 mg (not administered)    Or  acetaminophen (TYLENOL) suppository 650 mg (not administered)  ondansetron (ZOFRAN) tablet 4 mg (not administered)    Or  ondansetron (ZOFRAN) injection 4 mg (not administered)  insulin glargine (LANTUS) injection 8 Units (8 Units Subcutaneous Given 07/13/16 2138)  insulin aspart (novoLOG) injection 0-20 Units (11 Units Subcutaneous Given 07/13/16 2317)  furosemide (LASIX) tablet 40 mg (not administered)  ceFEPIme  (MAXIPIME) 2 g in dextrose 5 % 50 mL IVPB (not administered)  vancomycin (VANCOCIN) 1,500 mg in sodium chloride 0.9 % 500 mL IVPB (not administered)  vancomycin (VANCOCIN) 2,000 mg in sodium chloride 0.9 % 500 mL IVPB (0 mg Intravenous Stopped 07/13/16 1749)  ceFEPIme (MAXIPIME) 2 g in dextrose 5 % 50 mL IVPB (0 g Intravenous Stopped 07/13/16 1612)  sodium chloride 0.9 % bolus 1,000 mL (0 mLs Intravenous Stopped 07/13/16 1712)  furosemide (LASIX) injection 40 mg (40 mg Intravenous Given 07/13/16 1707)  levETIRAcetam (KEPPRA) 500 mg in sodium chloride 0.9 % 100 mL IVPB (500 mg Intravenous Given 07/13/16 2110)  sodium chloride 0.9 % bolus 500 mL (0 mLs Intravenous Stopped 07/13/16 1912)     Initial Impression / Assessment and Plan / ED Course  I have reviewed the triage vital signs and the nursing notes.  Pertinent labs & imaging results that were available during my care of the patient were reviewed by me and considered in my medical decision making (see chart for details).      On arrival patient has a rectal temperature of 100.3. BP soft with MAP around 65, initially in A. fib with RVR, then HR settled out to 80 bpm. EKG demonstrates known left bundle-branch block and no acute change from prior. I-STAT troponin 0.01 and I doubt underlying cardiac etiology. Arterial blood gas shows hypercarbic respiratory acidosis, with PCO2 of 80.7 and pH of 7.28. Chest x-ray with bilateral pleural effusions and pulmonary edema. Patient started on BiPAP with improvement in work of breathing. She does not look to have a history of COPD, but I suspect encephalopathy in the setting of hypercarbia. Chest x-ray also concerning for possible mild opacity in the right mid and lower lung fields. Patient started on vancomycin and cefepime for empiric coverage of PNA, blood and urine cultures sent. No lactic acidosis and no leukocytosis. Full 30 mL/kg of IV fluids not administered, as I suspect that the patient is fluid overloaded  based on her chest x-ray and history of CHF.  In summary, suspect possible underlying pneumonia as well as fluid overload state with element of obstructive pathology as the cause of the patient's respiratory distress. Will admit to stepdown unit for further management.  Care of pt overseen by my attending, Dr. Stark Jock.   Final Clinical Impressions(s) / ED Diagnoses   Final diagnoses:  Acute on chronic respiratory failure with hypoxia and hypercapnia [facility])    New Prescriptions Current Discharge Medication List       Zipporah Plants, MD 07/14/16 0041    Veryl Speak, MD 07/14/16 1535

## 2016-07-13 NOTE — ED Triage Notes (Signed)
Pt to ER by [facility] from home where family activated EMS for altered mental status onset at 11 am. Pt woke up around 8 am, was said to be at baseline a/o x4 at that time, took her daily medications. Around 11 am family went back to check on patient and patient was minimally responsive at that time and had vomited, only responsive to pain. On arrival to ER patient is responding and answering questions, respirations labored, hx of CHF wears 4L nasal cannula daily. BP 176/110, HR atrial fibrillation at 110-120, O2 88% on daily oxygen, arrives on NRB. CBG 442.

## 2016-07-13 NOTE — Progress Notes (Signed)
Pharmacy Antibiotic Note  Elizabeth Pugh is a 83 y.o. female admitted on 07/13/2016 with sepsis.  Pharmacy has been consulted for vancomycin and cefepime dosing. Patient has penicillin allergy (rash), but has had cefepime in the past. On 4L O2 at home.   On admission, patient with temp up to 100.46F, lactic acid up to 2.37, and WBC within normal limits. Serum creatinine is slightly above baseline currently, with nCrCL ~35 mL/min.  Plan: Vancomycin 2000mg  IV x1, then 1500mg  IV every 24 hours Cefepime 2gm IV every 24 hours Monitor renal function, clinical progress, VT as indicated  No data recorded.  No results for input(s): WBC, CREATININE, LATICACIDVEN, VANCOTROUGH, VANCOPEAK, VANCORANDOM, GENTTROUGH, GENTPEAK, GENTRANDOM, TOBRATROUGH, TOBRAPEAK, TOBRARND, AMIKACINPEAK, AMIKACINTROU, AMIKACIN in the last 168 hours.  Estimated Creatinine Clearance: 52.5 mL/min (by C-G formula based on SCr of 1 mg/dL).    Allergies  Allergen Reactions  . Erythromycin Other (See Comments)    Vaginal itching  . Penicillins Rash and Other (See Comments)    Has patient had a PCN reaction causing immediate rash, facial/tongue/throat swelling, SOB or lightheadedness with hypotension: No Has patient had a PCN reaction causing severe rash involving mucus membranes or skin necrosis: No Has patient had a PCN reaction that required hospitalization: No Has patient had a PCN reaction occurring within the last 10 years: No If all of the above answers are "NO", then may proceed with Cephalosporin use.  Marland Kitchen Zithromax [Azithromycin] Other (See Comments)    gave her a yeast infection.    Antimicrobials this admission: 3/12 vancomycin >>  3/12 cefepime >>   Dose adjustments this admission: N/A  Microbiology results: 3/12 BCx: sent 3/12 UCx: sent  3/12 MRSA PCR: negative  Thank you for allowing pharmacy to be a part of this patient's care.  Norwood Levo EBRAX 07/13/2016 3:00 PM

## 2016-07-13 NOTE — ED Notes (Signed)
Paged MD about patient's BP.

## 2016-07-13 NOTE — Progress Notes (Signed)
Settings adjusted per ABG values to help better promote adequate ventilation. Minute ventilation has improve per new settings. FiO2 decrease to 35% pt tolerating it well.

## 2016-07-13 NOTE — H&P (Addendum)
[facility] History and Physical  GWENDOLYNE WELFORD IOE:[id number] DOB: 1926/05/29 DOA: 07/13/2016   PCP: Philis Fendt, MD  Specialists: Dr. Terrence Dupont is her cardiologist. Has been seen by pulmonology as well.  Chief Complaint: Shortness of breath  HPI: Erionna JANIT CUTTER is a 83 y.o. female with a past medical history of morbid obesity, insulin-dependent diabetes, chronic respiratory failure with hypoxia and hypercapnia, OHS and possibly sleep apnea as well, history of atrial fibrillation on anticoagulation, who was recently in the hospital in February and was discharged on February 20. At that time she was hospitalized for fluid overload, pneumonia, encephalopathy, respiratory failure. She also was noted to have a UTI. Patient was discharged to a skilled nursing facility. At the skilled nursing facility patient was using a BiPAP. From the SNF she was sent home on Friday. However, since patient has not had a sleep study, home health agency could not provide her with a BiPAP machine for home. Over the last 2-3 days the daughter has noted increased somnolence. Today she was noted to be short of breath. No history of cough, fever, nausea or vomiting. No chest pains. She does have lower extremity edema, but her leg swelling has improved over the last 1 month. According to the daughter, patient has been compliant with her medications. Patient does not take any narcotics.  In the emergency department, patient was found to be tachypneic. She was very somnolent, though easily arousable. Heart rate was in 110's. Oxygen saturation was 88%. Patient does use oxygen at home. ABGs revealed acute respiratory acidosis with hypercapnia. Patient was placed on BiPAP. She started improving.  Home Medications: Prior to Admission medications   Medication Sig Start Date End Date Taking? Authorizing Provider  albuterol (PROVENTIL HFA;VENTOLIN HFA) 108 (90 BASE) MCG/ACT inhaler Inhale 1 puff into the lungs every 6  (six) hours as needed for wheezing or shortness of breath.    Historical Provider, MD  apixaban (ELIQUIS) 5 MG TABS tablet Take 1 tablet (5 mg total) by mouth 2 (two) times daily. 09/27/15   Lavina Hamman, MD  calcium-vitamin D (CALCIUM 500+D) 500-400 MG-UNIT per tablet Take 1 tablet by mouth 2 (two) times daily.    Historical Provider, MD  dextromethorphan-guaiFENesin (MUCINEX DM) 30-600 MG 12hr tablet Take 1 tablet by mouth 2 (two) times daily. Take for 1 week    Historical Provider, MD  Docusate Calcium (STOOL SOFTENER PO) Take 100 mg by mouth daily as needed (For constipation.). Colace    Historical Provider, MD  ferrous sulfate 325 (65 FE) MG tablet Take 325 mg by mouth daily with supper.     Historical Provider, MD  furosemide (LASIX) 40 MG tablet Take 40 mg by mouth 2 (two) times daily.     Historical Provider, MD  hydrOXYzine (ATARAX/VISTARIL) 25 MG tablet Take 25 mg by mouth every 8 (eight) hours as needed for itching.  05/10/16   Historical Provider, MD  insulin aspart (NOVOLOG) 100 UNIT/ML injection Inject 0-12 Units into the skin as needed for high blood sugar. 0-150 = 0 units, 151-200 = 2 units, 201-250 = 4 units, 251-300 = 6 units, 301-350 = 8 units, 351-400 = 10 units, 400+ = 12 units and notify NP/MD    Historical Provider, MD  insulin glargine (LANTUS) 100 UNIT/ML injection Inject 0.2 mLs (20 Units total) into the skin at bedtime. 06/23/16   Theodis Blaze, MD  ipratropium-albuterol (DUONEB) 0.5-2.5 (3) MG/3ML SOLN Take 3 mLs by nebulization. Every 6 hours while awake  x5 days, then q6h prn for dyspnea and wheezing    Historical Provider, MD  levETIRAcetam (KEPPRA) 500 MG tablet Take 1 tablet (500 mg total) by mouth 2 (two) times daily. 02/27/12   Monika Salk, MD  metoprolol tartrate (LOPRESSOR) 25 MG tablet Take 0.5 tablets (12.5 mg total) by mouth 2 (two) times daily. 06/23/16   Theodis Blaze, MD  Naphazoline-Pheniramine (ALLERGY EYE OP) Place 1 drop into both eyes as needed (For eye  irritation.).    Historical Provider, MD  pantoprazole (PROTONIX) 40 MG tablet Take 1 tablet (40 mg total) by mouth 2 (two) times daily before a meal. 10/01/15   Lavina Hamman, MD  polyethylene glycol (MIRALAX / Floria Raveling) packet Take 17 g by mouth daily as needed for mild constipation. 06/11/16   Mariel Aloe, MD  pregabalin (LYRICA) 75 MG capsule Take 75 mg by mouth at bedtime.     Historical Provider, MD  rOPINIRole (REQUIP) 2 MG tablet Take 1 tablet (2 mg total) by mouth 3 (three) times daily. 09/27/15   Lavina Hamman, MD  Vitamin D, Ergocalciferol, (DRISDOL) 50000 units CAPS capsule Take 50,000 Units by mouth every Saturday. No specific day     Historical Provider, MD  VOLTAREN 1 % GEL Apply 1 application topically 4 (four) times daily as needed (For knee pain.).  10/26/11   Historical Provider, MD    Allergies:  Allergies  Allergen Reactions  . Erythromycin Other (See Comments)    Vaginal itching  . Penicillins Rash and Other (See Comments)    Has patient had a PCN reaction causing immediate rash, facial/tongue/throat swelling, SOB or lightheadedness with hypotension: No Has patient had a PCN reaction causing severe rash involving mucus membranes or skin necrosis: No Has patient had a PCN reaction that required hospitalization: No Has patient had a PCN reaction occurring within the last 10 years: No If all of the above answers are "NO", then may proceed with Cephalosporin use.  Marland Kitchen Zithromax [Azithromycin] Other (See Comments)    gave her a yeast infection.    Past Medical History: Past Medical History:  Diagnosis Date  . Acute delirium 04/12/2011  . Acute kidney injury ([place]) 09/02/2015  . Anemia   . Anxiety   . Arthritis   . Atrial fibrillation ([place])   . Blood transfusion   . Bronchitis   . CHF (congestive heart failure) ([place])   . Diabetes mellitus   . GERD (gastroesophageal reflux disease)   . GI (gastrointestinal bleed)    Due to benign mass  . Hypertension   . Lobar  pneumonia due to unspecified organism 09/02/2015  . On home oxygen therapy    "4L; 24/7" (12/11/2015)  . Restless leg syndrome   . Seizures ([place])   . Stroke [facility])     Past Surgical History:  Procedure Laterality Date  . CATARACT EXTRACTION W/ INTRAOCULAR LENS IMPLANT     "Dr. Gershon Crane; think they did the right one"  . EUS  09/24/2011   Procedure: UPPER ENDOSCOPIC ULTRASOUND (EUS) LINEAR;  Surgeon: Beryle Beams, MD;  Location: [facility] ENDOSCOPY;  Service: Endoscopy;  Laterality: N/A;  . TONSILLECTOMY  1935  . TUBAL LIGATION      Social History: Patient lives with her daughter.  Social History   Social History  . Marital status: Widowed    Spouse name: N/A  . Number of children: N/A  . Years of education: N/A   Occupational History  . Not on file.  Social History Main Topics  . Smoking status: Former Smoker    Packs/day: 1.00    Years: 40.00    Types: Cigarettes    Quit date: 09/21/1985  . Smokeless tobacco: Never Used  . Alcohol use No  . Drug use: No  . Sexual activity: No   Other Topics Concern  . Not on file   Social History Narrative  . No narrative on file    Family History:  Family History  Problem Relation Age of Onset  . Aneurysm Mother   . Heart attack Father   . Heart failure Sister   . Asthma Sister   . Kidney failure Sister   . Stroke Sister      Review of Systems - Unable to do due to her respiratory status  Physical Examination  Vitals:   07/13/16 1600 07/13/16 1630 07/13/16 1645 07/13/16 1700  BP: (!) 100/51 106/58 108/55 113/68  Pulse: 104 93 95 95  Resp: 23 19 18 19   Temp: 100.3 F (37.9 C)     TempSrc: Rectal     SpO2: 100% 100% 100% 100%  Weight:      Height:        BP 113/68   Pulse 95   Temp 100.3 F (37.9 C) (Rectal)   Resp 19   Ht 5\' 6"  (1.676 m)   Wt 124.7 kg (275 lb)   SpO2 100%   BMI 44.39 kg/m   General appearance: Patient is on BiPAP. She is somnolent but easily arousable. Morbidly obese Head: Normocephalic,  without obvious abnormality, atraumatic Eyes: conjunctivae/corneas clear. PERRL, EOM's intact.  Neck: no adenopathy, no carotid bruit, no JVD, supple, symmetrical, trachea midline and thyroid not enlarged, symmetric, no tenderness/mass/nodules Resp: Diminished air entry at the bases. No wheezing. No definite crackles. Cardio: S1, S2 is irregularly irregular. No S3, S4. No rubs, murmurs or bruit. Pedal edema is appreciated. Bilateral lower extremities. Left more than right, which is her baseline. GI: Abdomen is obese. Nontender. No obvious masses or organomegaly. Extremities: Bilateral lower extremity edema, left more than right, it is chronic. Pulses: Difficult to palpate pedal pulses due to edema Skin: Hyperpigmentation noted bilateral lower extremity Lymph nodes: Cervical, supraclavicular, and axillary nodes normal. Neurologic: Patient has known left-sided deficits from previous stroke. Able to move her right side without any difficulty.   Labs on Admission: I have personally reviewed following labs and imaging studies  CBC:  Recent Labs Lab 07/13/16 1457  WBC 6.8  NEUTROABS 5.9  HGB 11.6*  HCT 37.6  MCV 99.5  PLT 102*   Basic Metabolic Panel:  Recent Labs Lab 07/13/16 1457  NA 140  K 4.9  CL 98*  CO2 34*  GLUCOSE 394*  BUN 23*  CREATININE 1.26*  CALCIUM 8.7*   GFR: Estimated Creatinine Clearance: 41.7 mL/min (by C-G formula based on SCr of 1.26 mg/dL (H)). Liver Function Tests:  Recent Labs Lab 07/13/16 1457  AST 17  ALT 10*  ALKPHOS 47  BILITOT 1.1  PROT 6.6  ALBUMIN 3.4*   Radiological Exams on Admission: Dg Chest Port 1 View  Result Date: 07/13/2016 CLINICAL DATA:  Altered mental status. EXAM: PORTABLE CHEST 1 VIEW COMPARISON:  June 15, 2016 FINDINGS: Cardiomegaly. The hila and mediastinum are stable. No pneumothorax. Small bilateral pleural effusions, right greater than left. Opacity in the left base has decreased in the interval. There is still  mild patchy opacity in the right mid and lower lung. IMPRESSION: Small bilateral pleural effusions, right greater  than left. Interval resolution of left-sided opacity. Mild patchy opacity in the right mid and lower lung. Electronically Signed   By: Dorise Bullion III M.D   On: 07/13/2016 15:22    My interpretation of Electrocardiogram: Atrial fibrillation with mild RVR at 118 bpm. LBBB is noted. Nonspecific T-wave changes. EKG is similar to the one from 2/12.   Problem List  Principal Problem:   Acute respiratory failure with hypoxia and hypercapnia (HCC) Active Problems:   DM (diabetes mellitus), type 2, uncontrolled with complications (HCC)   CVA, old, hemiparesis (HCC)   Chronic diastolic heart failure (HCC)   Bilateral leg edema   Acute encephalopathy   Chronic respiratory failure with hypoxia and hypercapnia (HCC)   Acute respiratory failure with hypercapnia (HCC)   Assessment: This is a 83 year old female with past medical history as stated above, who presents with 2-3 day history of increased somnolence with development of shortness of breath this morning and alteration in mental status. Patient has acute respiratory acidosis with hypercapnia. This is the most likely reason for her encephalopathy. Presentation is due to the fact that she has a history of obesity hypoventilation syndrome and does not have a BiPAP to use at home.   Plan: #1 acute respiratory failure with hypercapnia and hypoxia: Presentation most likely secondary to OHS, possibly sleep apnea. We will need to consult case manager to assist patient with getting BiPAP for home use. Initially there was some concern for sepsis and pneumonia. However, x-ray looks better than what it was last month. She has a normal WBC. She did have a low-grade fever. Her lactic acid level is normal. She was given vancomycin and cefepime in the ED. No clear indication to continue with the same. Will repeat ABG. Monitor clinically off  antibiotics.  #2 chronic systolic and diastolic CHF: Patient does have leg edema. She's always had left leg swelling more than right according to the patient's daughter. Daughter says that her leg swelling looks much better than what it used to be. Her previous records were reviewed. She weighed 137 kg on February 17, and by the time she was diuresed she was down to 124 kg. Her weight today is 124.7 kg. So, it doesn't appear that she has gained a lot of weight. She was given a dose of Lasix in the ED. Anticipate that her mental status would improve by tomorrow. She can just resume her oral Lasix from tomorrow onwards. Last echocardiogram was from February which showed EF of 40-45%.  #3 history of atrial fibrillation with mild RVR: Heart rate actually is reasonably well controlled at this time. She is on metoprolol at home. She'll be given intravenous doses tonight and resume oral dose from tomorrow. Patient is on oral anticoagulation with Eliquis. This can be resumed from tomorrow.  #4 diabetes mellitus type 2, insulin-dependent, uncontrolled: She is on Lantus at home. She also uses Humalog. Sliding scale insulin coverage will be initiated. Lantus will be given at lower dose.  #5 acute encephalopathy: Most likely secondary to hypercapnia. Mental status has already improved. She does have a history of old stroke with left-sided weakness. No change in her neurological status recently.  #6 history of seizure disorder: Continue with Keppra. Give her one intravenous dose tonight and resume oral from tomorrow.  #7 History of restless leg syndrome: Continue with Requip.  ADDENDUM Repeat lactic acid was elevated. This is somewhat puzzling. Will give another fluid bolus. Repeat levels. Caution with fluids due to CHF. Clinically she does  not appear to have sepsis but continue with antibiotics for now.  DVT Prophylaxis: On Eliquis Code Status: Discussed with daughter. Full code for now. Although patient would  not want prolonged intubation. Family Communication: Discussed with daughter  Disposition Plan: Stepdown unit  Consults called: None  Admission status: Patient merits inpatient admission status due to acuity of her illness. Need for close monitoring of respiratory status. Use of BiPAP.  Further management decisions will depend on results of further testing and patient's response to treatment.   [facility]  [facility] Pager [phone]  If 7PM-7AM, please contact night-coverage [email or web address] Password [facility]  07/13/2016, 5:17 PM

## 2016-07-13 NOTE — ED Notes (Signed)
Patient not to received full fluid bolus due to Hx of CHF per MD [name]. BP stable and HR stable.

## 2016-07-13 NOTE — Telephone Encounter (Signed)
Attempted to contact pt's daughter, [name]. No answer, no option to leave a message. Will try back.

## 2016-07-13 NOTE — ED Notes (Signed)
RT stating that patient is in need of a sleep study due to needing BiPap or CPap when patient is sent back to facility

## 2016-07-14 LAB — CBC
HEMATOCRIT: 33.1 % — AB (ref 36.0–46.0)
HEMOGLOBIN: 9.9 g/dL — AB (ref 12.0–15.0)
MCH: 30 pg (ref 26.0–34.0)
MCHC: 29.9 g/dL — AB (ref 30.0–36.0)
MCV: 100.3 fL — AB (ref 78.0–100.0)
Platelets: 107 10*3/uL — ABNORMAL LOW (ref 150–400)
RBC: 3.3 MIL/uL — ABNORMAL LOW (ref 3.87–5.11)
RDW: 14.2 % (ref 11.5–15.5)
WBC: 3.1 10*3/uL — ABNORMAL LOW (ref 4.0–10.5)

## 2016-07-14 LAB — GLUCOSE, CAPILLARY
GLUCOSE-CAPILLARY: 124 mg/dL — AB (ref 65–99)
GLUCOSE-CAPILLARY: 173 mg/dL — AB (ref 65–99)
GLUCOSE-CAPILLARY: 195 mg/dL — AB (ref 65–99)
GLUCOSE-CAPILLARY: 262 mg/dL — AB (ref 65–99)
GLUCOSE-CAPILLARY: 71 mg/dL (ref 65–99)
Glucose-Capillary: 100 mg/dL — ABNORMAL HIGH (ref 65–99)

## 2016-07-14 LAB — BLOOD GAS, ARTERIAL
ACID-BASE EXCESS: 11.2 mmol/L — AB (ref 0.0–2.0)
BICARBONATE: 36.8 mmol/L — AB (ref 20.0–28.0)
Drawn by: 44898
O2 Content: 4 L/min
O2 Saturation: 98.8 %
PCO2 ART: 62.8 mmHg — AB (ref 32.0–48.0)
PH ART: 7.382 (ref 7.350–7.450)
PO2 ART: 131 mmHg — AB (ref 83.0–108.0)
Patient temperature: 97.6

## 2016-07-14 LAB — MRSA PCR SCREENING: MRSA BY PCR: NEGATIVE

## 2016-07-14 LAB — BASIC METABOLIC PANEL
ANION GAP: 7 (ref 5–15)
BUN: 21 mg/dL — ABNORMAL HIGH (ref 6–20)
CO2: 38 mmol/L — ABNORMAL HIGH (ref 22–32)
Calcium: 8.6 mg/dL — ABNORMAL LOW (ref 8.9–10.3)
Chloride: 101 mmol/L (ref 101–111)
Creatinine, Ser: 1.05 mg/dL — ABNORMAL HIGH (ref 0.44–1.00)
GFR calc Af Amer: 53 mL/min — ABNORMAL LOW (ref >60)
GFR calc non Af Amer: 46 mL/min — ABNORMAL LOW (ref >60)
GLUCOSE: 111 mg/dL — AB (ref 65–99)
POTASSIUM: 3.8 mmol/L (ref 3.5–5.1)
Sodium: 146 mmol/L — ABNORMAL HIGH (ref 135–145)

## 2016-07-14 LAB — LACTIC ACID, PLASMA: Lactic Acid, Venous: 1.4 mmol/L (ref 0.5–1.9)

## 2016-07-14 LAB — URINE CULTURE: Culture: <10000 — AB

## 2016-07-14 MED ORDER — CHLORHEXIDINE GLUCONATE 0.12 % MT SOLN
15.0000 mL | Freq: Two times a day (BID) | OROMUCOSAL | Status: DC
  Administered 2016-07-14 – 2016-07-21 (×15): 15 mL via OROMUCOSAL
  Filled 2016-07-14 (×14): qty 15

## 2016-07-14 MED ORDER — ORAL CARE MOUTH RINSE
15.0000 mL | Freq: Two times a day (BID) | OROMUCOSAL | Status: DC
  Administered 2016-07-14 – 2016-07-21 (×11): 15 mL via OROMUCOSAL

## 2016-07-14 NOTE — Progress Notes (Signed)
ABG's done on 4 L n/c see results in labs MD gave verbal order to titrate 02 down Pt placed on 2 L N/C sats still mid 90's

## 2016-07-14 NOTE — Evaluation (Signed)
Occupational Therapy Evaluation and Discharge Patient Details Name: Elizabeth Pugh MRN: [id number] DOB: Aug 23, 1926 Today's Date: 07/14/2016    History of Present Illness 83 year old female with past medical history as stated above, who presents with 2-3 day history of increased somnolence with development of shortness of breath this morning and alteration in mental status. Patient has acute respiratory acidosis with hypercapnia.    Clinical Impression   Pt is functioning at her baseline in mobility and ADL. Family plans for pt to return home to their care. No further OT needs. Pt returned home on 07/11/16 after 3 weeks in SNF.    Follow Up Recommendations  No OT follow up;Supervision/Assistance - 24 hour    Equipment Recommendations  None recommended by OT    Recommendations for Other Services       Precautions / Restrictions Precautions Precautions: Fall Restrictions Weight Bearing Restrictions: No RLE Weight Bearing:  (Pt states she's bedbound )      Mobility Bed Mobility Overal bed mobility: Needs Assistance Bed Mobility: Rolling Rolling: +2 for physical assistance;Max assist         General bed mobility comments: encouraged patient to use bilateral UEs on bed rails, max A x2 at bilateral LEs and hips  Transfers                 General transfer comment: pt is bed-bound at baseline and requires lift equipment for transfers at home    Balance Overall balance assessment:  (bed bound at baseline)                                          ADL Overall ADL's : At baseline                                       General ADL Comments: Pt can self feed and perform some grooming at bed level.     Vision Patient Visual Report: No change from baseline Vision Assessment?: No apparent visual deficits     Perception     Praxis      Pertinent Vitals/Pain Pain Assessment: No/denies pain     Hand Dominance Right    Extremity/Trunk Assessment Upper Extremity Assessment Upper Extremity Assessment: Generalized weakness RUE Deficits / Details: 4-/5 shoulder, 4/5 biceps, 3+/5 triceps, 4/5 gross grasp LUE Deficits / Details: 3+/5 shoulder, 4/5 biceps, 3+/5 triceps, 4/5 gross grasp   Lower Extremity Assessment Lower Extremity Assessment: Defer to PT evaluation RLE Deficits / Details: gross LE strength 2/5. pitting edema noted; pt able to lift lower leg against gravity through partial ROM during bed mobility LLE Deficits / Details: pitting edema noted, patient able to wiggle toes and dorsiflex ankle modestly, unable to perform any other LE motion against gravity   Cervical / Trunk Assessment Cervical / Trunk Assessment:  (increased body habitus)   Communication Communication Communication: No difficulties   Cognition Arousal/Alertness: Awake/alert Behavior During Therapy: WFL for tasks assessed/performed Overall Cognitive Status: Within Functional Limits for tasks assessed                     General Comments  educated on positioning and encouraged ROM as able    Exercises       Shoulder Instructions      Home Living Family/patient expects to be  discharged to:: Private residence Living Arrangements: Children Available Help at Discharge: Family;Available 24 hours/day;Personal care attendant Type of Home: House Home Access: Ramped entrance     Home Layout: One level     Bathroom Shower/Tub:  (sponge bath)   Bathroom Toilet: Handicapped height Bathroom Accessibility: Yes   Home Equipment: Hospital bed;Transport chair;Electric scooter;Bedside commode;Other (comment);Wheelchair - power (hoyer lift, lift chair, trapeze, transport chair)   Additional Comments: patient's family help her bathe and perform all aspects of self care other than feeding. Patient bed bound at baseline for mobility      Prior Functioning/Environment Level of Independence: Needs assistance  Gait / Transfers  Assistance Needed: bed bound except when grandson present and can use hoyer lift for OOB. Has been non-ambulatory x 2 yrs per pt ADL's / Homemaking Assistance Needed: Total assist for bathing, dressing, toileting (per pt, she wears a diaper and daughter changes her) has a CNA             OT Problem List: Decreased strength;Obesity;Decreased activity tolerance;Impaired UE functional use      OT Treatment/Interventions:      OT Goals(Current goals can be found in the care plan section) Acute Rehab OT Goals Patient Stated Goal: family just completed 20 days at SNF, will go home  OT Frequency:     Barriers to D/C:            Co-evaluation   Reason for Co-Treatment: For patient/therapist safety PT goals addressed during session: Mobility/safety with mobility OT goals addressed during session: Strengthening/ROM      End of Session    Activity Tolerance: Patient tolerated treatment well Patient left: in bed;with call bell/phone within reach;with family/visitor present  OT Visit Diagnosis: Muscle weakness (generalized) (M62.81)                ADL either performed or assessed with clinical judgement  Time: 2297-9892 OT Time Calculation (min): 22 min Charges:  OT General Charges $OT Visit: 1 Procedure OT Evaluation $OT Eval Moderate Complexity: 1 Procedure G-Codes:     Malka So 07/14/2016, 12:13 PM  [phone]

## 2016-07-14 NOTE — Progress Notes (Signed)
PROGRESS NOTE    Elizabeth Pugh  OZD:[id number] DOB: 07-14-1926 DOA: 07/13/2016 PCP: Philis Fendt, MD   Brief Narrative:  83 year old female with past medical history morbid obesity, diabetes, chronic respiratory failure with hypoxia and hypercapnia, OHS, atrial fibrillation was brought to the ER for shortness of breath. In the ER she was somnolent although easily arousable. Her ABG showed severe rest. Acidosis with hypercapnia therefore she was placed on BiPAP. Upon admission she was noted to have concerns of possible underlying pneumonia and volume overload. She was started on Lasix and IV antibiotics.   Assessment & Plan:   Principal Problem:   Acute respiratory failure with hypoxia and hypercapnia (HCC) Active Problems:   DM (diabetes mellitus), type 2, uncontrolled with complications (HCC)   CVA, old, hemiparesis (HCC)   Chronic atrial fibrillation (HCC)   Chronic diastolic heart failure (HCC)   Bilateral leg edema   Acute encephalopathy   Chronic respiratory failure with hypoxia and hypercapnia (HCC)   Acute respiratory failure with hypercapnia (HCC)   Acute respiratory failure with hypercapnia and hypoxia-improved -Multifactorial in nature likely secondary to OHS, CHF and possible underlying pneumonia -We'll continue to provide supplemental oxygen with nasal cannula and been off as appropriate -Repeat ABG this morning appears to be better -Continue IV antibiotics at this time.  Acute on chronic systolic congestive CHF with ejection fraction of 45%-much improved -She is close to being euvolemic at this time. We'll transition to oral Lasix  -I will put her on fluid restriction. Continue cardiac diet -Monitor I's and O's.  History of atrial fibrillation -On metoprolol for rate control. On liquids for anticoagulation  Diabetes type 2 -Continue home Lantus and sliding scale.  Acute encephalopathy likely secondary to hypercapnia which is resolving this time.  Medical  noncompliance-per Daughter patient did not have BiPAP machine at home since she left skilled nursing facility  History of seizures-continue Keppra.  DVT prophylaxis: Eliquis Code Status: Full  Family Communication:  Spoke with the patients sister who is at the bedside and the patient daughter [name]. Request CM to help with obtaining BiPAP at home   Disposition Plan: I will continue to monitor off BiPAP and the stepdown today. We will determine which she needs it meanwhile also get case manager will help set up home BiPAP. Likely home/rehabilitation in 48 hours if she continues to remain stable.  Consultants:   None  Procedures:   None  Antimicrobials:   Vanc and Cefepime    Subjective: Patient is much more awake this morning. No complaints. She still reports of little sob with much movement.  Objective: Vitals:   07/14/16 0900 07/14/16 0946 07/14/16 1000 07/14/16 1132  BP:  (!) 95/50 132/71   Pulse: 83 83 89   Resp: 17  (!) 23   Temp:    97.7 F (36.5 C)  TempSrc:    Oral  SpO2: 100%  100%   Weight:      Height:        Intake/Output Summary (Last 24 hours) at 07/14/16 1424 Last data filed at 07/14/16 1204  Gross per 24 hour  Intake             2343 ml  Output              350 ml  Net             1993 ml   Filed Weights   07/13/16 1518 07/13/16 2022 07/14/16 0500  Weight: 124.7 kg (275 lb)  130.5 kg (287 lb 12.8 oz) 130.1 kg (286 lb 12.8 oz)    Examination:  General exam: Appears calm and comfortable. Obese  Respiratory system: Clear to auscultation. Slightly decreaed BS on the right.  Cardiovascular system: S1 & S2 heard, RRR. No JVD, murmurs, rubs, gallops or clicks. No pedal edema. Gastrointestinal system: Abdomen is nondistended, soft and nontender. No organomegaly or masses felt. Normal bowel sounds heard. Central nervous system: Alert and oriented. No focal neurological deficits. Some difficulty moving the left side due to the previous stroke.    Extremities: Symmetric 5 x 5 power. Skin: No rashes, lesions or ulcers Psychiatry: Judgement and insight appear normal. Mood & affect appropriate.     Data Reviewed:   CBC:  Recent Labs Lab 07/13/16 1457 07/14/16 1052  WBC 6.8 3.1*  NEUTROABS 5.9  --   HGB 11.6* 9.9*  HCT 37.6 33.1*  MCV 99.5 100.3*  PLT 111* 409*   Basic Metabolic Panel:  Recent Labs Lab 07/13/16 1457 07/14/16 1052  NA 140 146*  K 4.9 3.8  CL 98* 101  CO2 34* 38*  GLUCOSE 394* 111*  BUN 23* 21*  CREATININE 1.26* 1.05*  CALCIUM 8.7* 8.6*   GFR: Estimated Creatinine Clearance: 51.2 mL/min (by C-G formula based on SCr of 1.05 mg/dL (H)). Liver Function Tests:  Recent Labs Lab 07/13/16 1457  AST 17  ALT 10*  ALKPHOS 47  BILITOT 1.1  PROT 6.6  ALBUMIN 3.4*   No results for input(s): LIPASE, AMYLASE in the last 168 hours. No results for input(s): AMMONIA in the last 168 hours. Coagulation Profile: No results for input(s): INR, PROTIME in the last 168 hours. Cardiac Enzymes: No results for input(s): CKTOTAL, CKMB, CKMBINDEX, TROPONINI in the last 168 hours. BNP (last 3 results) No results for input(s): PROBNP in the last 8760 hours. HbA1C: No results for input(s): HGBA1C in the last 72 hours. CBG:  Recent Labs Lab 07/13/16 2109 07/13/16 2312 07/14/16 0442 07/14/16 0749 07/14/16 1131  GLUCAP 320* 255* 124* 71 100*   Lipid Profile: No results for input(s): CHOL, HDL, LDLCALC, TRIG, CHOLHDL, LDLDIRECT in the last 72 hours. Thyroid Function Tests: No results for input(s): TSH, T4TOTAL, FREET4, T3FREE, THYROIDAB in the last 72 hours. Anemia Panel: No results for input(s): VITAMINB12, FOLATE, FERRITIN, TIBC, IRON, RETICCTPCT in the last 72 hours. Sepsis Labs:  Recent Labs Lab 07/13/16 1517 07/13/16 1800 07/13/16 2045 07/14/16 1052  LATICACIDVEN 1.65 2.37* 2.1* 1.4    Recent Results (from the past 240 hour(s))  MRSA PCR Screening     Status: None   Collection Time:  07/13/16 11:04 PM  Result Value Ref Range Status   MRSA by PCR NEGATIVE NEGATIVE Final    Comment:        The GeneXpert MRSA Assay (FDA approved for NASAL specimens only), is one component of a comprehensive MRSA colonization surveillance program. It is not intended to diagnose MRSA infection nor to guide or monitor treatment for MRSA infections.          Radiology Studies: Dg Chest Port 1 View  Result Date: 07/13/2016 CLINICAL DATA:  Altered mental status. EXAM: PORTABLE CHEST 1 VIEW COMPARISON:  June 15, 2016 FINDINGS: Cardiomegaly. The hila and mediastinum are stable. No pneumothorax. Small bilateral pleural effusions, right greater than left. Opacity in the left base has decreased in the interval. There is still mild patchy opacity in the right mid and lower lung. IMPRESSION: Small bilateral pleural effusions, right greater than left. Interval resolution of  left-sided opacity. Mild patchy opacity in the right mid and lower lung. Electronically Signed   By: Dorise Bullion III M.D   On: 07/13/2016 15:22        Scheduled Meds: . apixaban  5 mg Oral BID  . ceFEPime (MAXIPIME) IV  2 g Intravenous Q24H  . chlorhexidine  15 mL Mouth Rinse BID  . furosemide  40 mg Oral BID  . insulin aspart  0-20 Units Subcutaneous Q4H  . insulin glargine  8 Units Subcutaneous QHS  . levETIRAcetam  500 mg Oral BID  . mouth rinse  15 mL Mouth Rinse q12n4p  . metoprolol tartrate  12.5 mg Oral BID  . rOPINIRole  2 mg Oral TID  . sodium chloride flush  3 mL Intravenous Q12H  . sodium chloride flush  3 mL Intravenous Q12H  . vancomycin  1,500 mg Intravenous Q24H   Continuous Infusions:   LOS: 1 day    Time spent: 35 mins     Myrene Bougher Arsenio Loader, MD [facility] Pager [phone]   If 7PM-7AM, please contact night-coverage [email or web address] Password [facility] 07/14/2016, 2:24 PM

## 2016-07-14 NOTE — Progress Notes (Signed)
   Per patient request, Advanced Directive (AD) documentation left at bedside.  If/when patient ready to move forward w/ AD, please page on-call chaplain or contact the Spiritual Care department (between the hours of 9AM - 3PM Mon - Fri).  Will follow, as needed.  - Rev. [place] MDiv ThM

## 2016-07-14 NOTE — Progress Notes (Signed)
Pt was taken off Bipap at 0630 Pt was placed on 4 L n/c at that time on night shift. Pt sats high 90's tolerating [place] 02 well. Text page to DR Reesa Chew to make aware that pt had been placed on [place] Night RN said that pt kept asking for mask to be taken off and to have something to drink. Pt was NPO while on Bipap

## 2016-07-14 NOTE — Progress Notes (Signed)
Daughter called with questions about patients care. Dr Reesa Chew spoke to daughter & answered her questions.

## 2016-07-14 NOTE — Care Management Note (Addendum)
Case Management Note  Patient Details  Name: Elizabeth Pugh MRN: [id number] Date of Birth: 1927-03-17  Subjective/Objective:                 Spoke to patient at the bedside. Patient was released from SNF 3 days prior to admission.  Patient is from home, lives with daughter whom she states recently had surgery is having difficulties caring for her. Patient has very restricted mobility. At home has hospital bed, hoyer, trapeze, WC, BSC, and home oxygen through [facility]. Patient admitted with moisture associated skin damage per bedside RN. Patient states daughter is on her way to hospital, CM will attempt to follow up with her.   Spoke with [place] [facility] [phone] to initiate BiPAP set up for home use for hypercapnea.   Patient history includes Obesity Hypoventilation Syndrome. Requirements for BiPAP for home use for hypoventilation include specific documentation defined below:  BiPAP S  ABG (arterial blood gas) with a PaCO2 > to 45 performed while the patient is awake and breathing the patient's prescribed oxygen setting  -AND-  Spirometry shows:  oFEV1/FVC > 70% of the predicted, AND oFEV1 > 50% of the predicted, AND  One of the following:  ABG performed while the patient is asleep or immediately upon awakening and breathing the patient's prescribed oxygen setting, shows a PaCO2 worsened by 59mmHG or greater compared to the original result -OR-  Facility based sleep study demonstrating oxygen saturations of 88% or less for 5 cumulative minutes.  (Nocturnal recording time must be at least 2 hours and the study should indicate that the desaturations are not caused by obstructive upper airway events (AHI less than 5).  Addendum 07/15/16 15:00 Submitted LOG application for BiPAP to [place], AD CSW. Spirometry and PFTs not doable as inpatient. Manuela Schwartz with [facility] will provide CM with names of outpatient providers and tests needed to be scheduled as outpatient. Manuela Schwartz will contact patient and discuss possible  ABN for BiPAP and payment plan. Also obtained order for Sleep Study, Faxed, left order in shadow chart.  Addendum 07/17/16 Spoke with [facility] [phone]. Order in place  for spirometry with graph, bedside. Anticipate completion today. Notified [facility]. He confirmed he will initiate insurance auth once results in. Anticipate BiPAP approval no earlier than 3/17, it will be delivered to patients room prior to her going home.      Action/Plan:  Will need transport home via [facility], and [place] (daughter states they are active with [facility], LM for Linus Mako, clinical liaison to verify, and referral made). CM will continue to follow.  3/20 11:49 Verified with Regan Rakers that patient will start services w [facility] RN after DC. Requested [facility] RN and F2F order from Dr Hartford Poli, [facility] papers printed and placed in chart folder. RN at bedside can call [facility] [phone]) for transport when BiPAP delivered to room. Anticipated delivery for today per [facility] with [facility].  13:30 Attempt to call daughter several times at numbers listed below, left hippa compliant message to call back. Pugh,Elizabeth Daughter [phone]  [phone]  Verified with [facility] they can transport Bipap machine  home with patient 14:00 Informed at this time that [facility] could not get approval for BiPAP. LM with [facility] respiratory specialist at [phone], no call back. Spoke with [facility], they are reviewing chart but could not get equipment for 3 days, spoke with Ronnell Guadalajara at [place], he is on his way to [facility] to review case, states he may be able  to provide non invasive ventilator as early as tomorrow. CM Updated Dr Hartford Poli on this very unexpected delay.  15:44 [facility] will provide BiPAP, will deliver it to house tonight per [facility] clinical liaison. Daughter Elizabeth Pugh provided with original signed order for sleep study. Elizabeth Pugh will call tonight and schedule, order faxed 3/14. [facility] called for transport nursing staff, patient, and Dr  Hartford Poli updated.    Expected Discharge Date:                  Expected Discharge Plan:  [place]  In-House Referral:     Discharge planning Services  CM Consult  Post Acute Care Choice:    Choice offered to:     DME Arranged:    DME Agency:     HH Arranged:    [place] Agency:     Status of Service:  In process, will continue to follow  If discussed at Long Length of Stay Meetings, dates discussed:    Additional Comments:  Carles Collet, RN 07/14/2016, 2:34 PM

## 2016-07-14 NOTE — Evaluation (Signed)
Physical Therapy Evaluation Patient Details Name: Elizabeth Pugh   MRN: [id number] DOB: 27-Sep-1926 Today's Date: 07/14/2016   History of Present Illness  83 year old female with past medical history as stated above, who presents with 2-3 day history of increased somnolence with development of shortness of breath this morning and alteration in mental status. Patient has acute respiratory acidosis with hypercapnia  Clinical Impression  Patient seen for mobility assessment and appropriateness for PT. At this time, patient appears at baseline (bed bound with total care from family). No further acute PT needs at this time.   Family inquiring availability for increased assist at home.     Follow Up Recommendations Supervision/Assistance - 24 hour    Equipment Recommendations  None recommended by PT    Recommendations for Other Services       Precautions / Restrictions Precautions Precautions: Fall Restrictions Weight Bearing Restrictions: Yes RLE Weight Bearing:  (Pt states she's bedbound )      Mobility  Bed Mobility Overal bed mobility: Needs Assistance Bed Mobility: Rolling Rolling: +2 for physical assistance;Max assist         General bed mobility comments: enoucraged patient to use bilateral UEs on bed rails, max A x2 at bilateral LEs and hips  Transfers                 General transfer comment: pt is bed-bound at baseline and requires lift equipment for transfers at home  Ambulation/Gait                Stairs            Wheelchair Mobility    Modified Rankin (Stroke Patients Only)       Balance Overall balance assessment:  (bed bound at baseline)                                           Pertinent Vitals/Pain Pain Assessment: No/denies pain    Home Living Family/patient expects to be discharged to:: Private residence Living Arrangements: Children Available Help at Discharge: Family;Available 24 hours/day;Personal  care attendant Type of Home: House Home Access: Ramped entrance     Home Layout: One level Home Equipment: Hospital bed;Transport chair;Electric scooter;Bedside commode;Other (comment);Wheelchair - power (hoyer lift, trapeze, transport chair, lift chair) Additional Comments: patient's family help her bathe and perform all aspects of self care other than feeding. Patient bed bound at baseline for mobility    Prior Function Level of Independence: Needs assistance   Gait / Transfers Assistance Needed: bed bound except when grandson present and can use hoyer lift for OOB. Has been non-ambulatory x 2 yrs per pt  ADL's / Homemaking Assistance Needed: Total assist for bathing, dressing, toileting (per pt, she wears a diaper and daughter changes her) has a CNA         Hand Dominance   Dominant Hand: Right    Extremity/Trunk Assessment   Upper Extremity Assessment RUE Deficits / Details: 4-/5 shoulder, 4/5 biceps, 3+/5 triceps, 4/5 gross grasp LUE Deficits / Details: 3+/5 shoulder, 4/5 biceps, 3+/5 triceps, 4/5 gross grasp    Lower Extremity Assessment Lower Extremity Assessment: Generalized weakness RLE Deficits / Details: gross LE strength 2/5. pitting edema noted; pt able to lift lower leg against gravity through partial ROM during bed mobility LLE Deficits / Details: pitting edema noted, patient able to wiggle toes and dorsiflex ankle modestly, unable  to perform any other LE motion against gravity    Cervical / Trunk Assessment Cervical / Trunk Assessment:  (increased body habitus)  Communication   Communication: No difficulties  Cognition Arousal/Alertness: Awake/alert Behavior During Therapy: WFL for tasks assessed/performed Overall Cognitive Status: Within Functional Limits for tasks assessed                      General Comments General comments (skin integrity, edema, etc.): educated on positioning and encouraged ROM as able    Exercises     Assessment/Plan     PT Assessment Patent does not need any further PT services  PT Problem List         PT Treatment Interventions      PT Goals (Current goals can be found in the Care Plan section)  Acute Rehab PT Goals PT Goal Formulation: All assessment and education complete, DC therapy    Frequency     Barriers to discharge        Co-evaluation PT/OT/SLP Co-Evaluation/Treatment: Yes Reason for Co-Treatment: For patient/therapist safety PT goals addressed during session: Mobility/safety with mobility OT goals addressed during session: Strengthening/ROM       End of Session Equipment Utilized During Treatment: Oxygen Activity Tolerance: Patient limited by fatigue Patient left: in bed;with call bell/phone within reach Nurse Communication: Mobility status;Need for lift equipment           Time: 1100-1117 PT Time Calculation (min) (ACUTE ONLY): 17 min   Charges:   PT Evaluation $PT Eval Moderate Complexity: 1 Procedure     PT G Codes:         Duncan Dull 08/09/16, 11:54 AM Alben Deeds, PT DPT  [phone]

## 2016-07-15 DIAGNOSIS — I482 Chronic atrial fibrillation: Secondary | ICD-10-CM

## 2016-07-15 LAB — BLOOD GAS, ARTERIAL
ACID-BASE EXCESS: 10.8 mmol/L — AB (ref 0.0–2.0)
Bicarbonate: 36.1 mmol/L — ABNORMAL HIGH (ref 20.0–28.0)
DELIVERY SYSTEMS: POSITIVE
DRAWN BY: 441661
EXPIRATORY PAP: 6
FIO2: 35
INSPIRATORY PAP: 14
LHR: 12 {breaths}/min
O2 SAT: 99.2 %
PH ART: 7.402 (ref 7.350–7.450)
Patient temperature: 97.6
pCO2 arterial: 58.7 mmHg — ABNORMAL HIGH (ref 32.0–48.0)
pO2, Arterial: 142 mmHg — ABNORMAL HIGH (ref 83.0–108.0)

## 2016-07-15 LAB — TROPONIN I: Troponin I: <0.03 ng/mL (ref <0.03)

## 2016-07-15 LAB — GLUCOSE, CAPILLARY
GLUCOSE-CAPILLARY: 139 mg/dL — AB (ref 65–99)
GLUCOSE-CAPILLARY: 200 mg/dL — AB (ref 65–99)
GLUCOSE-CAPILLARY: 235 mg/dL — AB (ref 65–99)
Glucose-Capillary: 112 mg/dL — ABNORMAL HIGH (ref 65–99)
Glucose-Capillary: 190 mg/dL — ABNORMAL HIGH (ref 65–99)
Glucose-Capillary: 199 mg/dL — ABNORMAL HIGH (ref 65–99)

## 2016-07-15 MED ORDER — INSULIN ASPART 100 UNIT/ML ~~LOC~~ SOLN
0.0000 [IU] | Freq: Every day | SUBCUTANEOUS | Status: DC
  Administered 2016-07-16 – 2016-07-17 (×2): 2 [IU] via SUBCUTANEOUS
  Administered 2016-07-18: 3 [IU] via SUBCUTANEOUS

## 2016-07-15 MED ORDER — FUROSEMIDE 10 MG/ML IJ SOLN
80.0000 mg | Freq: Two times a day (BID) | INTRAMUSCULAR | Status: DC
  Administered 2016-07-15 – 2016-07-16 (×2): 80 mg via INTRAVENOUS
  Filled 2016-07-15 (×2): qty 8

## 2016-07-15 MED ORDER — INSULIN GLARGINE 100 UNIT/ML ~~LOC~~ SOLN
10.0000 [IU] | Freq: Every day | SUBCUTANEOUS | Status: DC
  Administered 2016-07-15: 10 [IU] via SUBCUTANEOUS
  Filled 2016-07-15: qty 0.1

## 2016-07-15 MED ORDER — NITROGLYCERIN 0.4 MG SL SUBL
SUBLINGUAL_TABLET | SUBLINGUAL | Status: AC
  Administered 2016-07-15: 05:00:00
  Filled 2016-07-15: qty 1

## 2016-07-15 MED ORDER — INSULIN ASPART 100 UNIT/ML ~~LOC~~ SOLN
0.0000 [IU] | Freq: Three times a day (TID) | SUBCUTANEOUS | Status: DC
Start: 2016-07-15 — End: 2016-07-21
  Administered 2016-07-15: 4 [IU] via SUBCUTANEOUS
  Administered 2016-07-16: 7 [IU] via SUBCUTANEOUS
  Administered 2016-07-16: 20 [IU] via SUBCUTANEOUS
  Administered 2016-07-16: 11 [IU] via SUBCUTANEOUS
  Administered 2016-07-17 (×3): 7 [IU] via SUBCUTANEOUS
  Administered 2016-07-18: 4 [IU] via SUBCUTANEOUS
  Administered 2016-07-18: 7 [IU] via SUBCUTANEOUS
  Administered 2016-07-18: 4 [IU] via SUBCUTANEOUS
  Administered 2016-07-19 (×2): 15 [IU] via SUBCUTANEOUS
  Administered 2016-07-19: 4 [IU] via SUBCUTANEOUS
  Administered 2016-07-20: 7 [IU] via SUBCUTANEOUS
  Administered 2016-07-20: 4 [IU] via SUBCUTANEOUS
  Administered 2016-07-20: 7 [IU] via SUBCUTANEOUS
  Administered 2016-07-21 (×2): 4 [IU] via SUBCUTANEOUS

## 2016-07-15 NOTE — Telephone Encounter (Signed)
[id number] x1 

## 2016-07-15 NOTE — Progress Notes (Signed)
Pt placed on the BiPAP for the night. Pt is tolerating it well.

## 2016-07-15 NOTE — Progress Notes (Addendum)
[place] TEAM 1 - Stepdown/ICU TEAM  Elizabeth Pugh  UKG:[id number] DOB: 1926/08/24 DOA: 07/13/2016 PCP: Philis Fendt, MD    Brief Narrative:  83 year old female with history of morbid obesity, DM, chronic respiratory failure with hypoxia and hypercapnia due to OHS, and atrial fibrillation who was brought to the ER for shortness of breath. In the ER she was somnolent. Her ABG showed severe respiratory acidosis with hypercapnia therefore she was placed on BiPAP. Upon admission she was noted to have concerns of possible underlying pneumonia and volume overload. She was started on Lasix and IV antibiotics.  Subjective: The pt has been tolerating her BIPAP well each night.  She is alert and conversant, and states she slept better last night than she can remember ever sleeping of late.  She did have some chest discomfort early this morning, but states is has resolved at this time.  She denies n/v, abdom pain, or HA.    Assessment & Plan:  Acute respiratory failure with hypercapnia and hypoxia Appears to have been primarily due to OHS w/ severe hypoventilation during sleeping - no evidence of true infection (afeb, normal WBC) so will stop abx   Acute on chronic systolic congestive CHF with ejection fraction of 45% basline wgt appears to be as low as 124kg at times - volume overloaded on exam - diurese and follow Is/Os, daily weights, electrolytes, and renal fxn - no ACEi/ARB for now until sure renal fxn stable   Filed Weights   07/13/16 1518 07/13/16 2022 07/14/16 0500  Weight: 124.7 kg (275 lb) 130.5 kg (287 lb 12.8 oz) 130.1 kg (286 lb 12.8 oz)    Chronic atrial fibrillation On metoprolol and eliquis as outpt - rate controlled   Diabetes type 2 CBG reasonably controlled - follow trend  Acute encephalopathy secondary to hypercapnia Resolved w/ use of BIPAP   History of seizures continue Keppra  Morbid obesity - Body mass index is 46.29 kg/m.   DVT prophylaxis:  eliquis Code Status: FULL CODE Family Communication: spoke w/ family at bedside  Disposition Plan: d/c home when home BIPAP able to be procured   Consultants:  none  Procedures: none  Antimicrobials:  Cefepime 3/12 > Vanc 3/12 > 3/13  Objective: Blood pressure (!) 145/101, pulse 91, temperature 97.8 F (36.6 C), temperature source Oral, resp. rate 11, height 5\' 6"  (1.676 m), weight 130.1 kg (286 lb 12.8 oz), SpO2 100 %.  Intake/Output Summary (Last 24 hours) at 07/15/16 1349 Last data filed at 07/15/16 0936  Gross per 24 hour  Intake             1008 ml  Output              500 ml  Net              508 ml   Filed Weights   07/13/16 1518 07/13/16 2022 07/14/16 0500  Weight: 124.7 kg (275 lb) 130.5 kg (287 lb 12.8 oz) 130.1 kg (286 lb 12.8 oz)    Examination: General: No acute respiratory distress - alert  Lungs: Clear to auscultation bilaterally without wheezes or crackles - distant bs th/o  Cardiovascular: Regular rate without murmur - very distant HS  Abdomen: Nontender,morbidly obese, soft, bowel sounds positive, no rebound, no ascites, no appreciable mass Extremities: No significant cyanosis, or clubbing - 3+ edema bilateral lower extremities  CBC:  Recent Labs Lab 07/13/16 1457 07/14/16 1052  WBC 6.8 3.1*  NEUTROABS 5.9  --   HGB  11.6* 9.9*  HCT 37.6 33.1*  MCV 99.5 100.3*  PLT 111* 778*   Basic Metabolic Panel:  Recent Labs Lab 07/13/16 1457 07/14/16 1052  NA 140 146*  K 4.9 3.8  CL 98* 101  CO2 34* 38*  GLUCOSE 394* 111*  BUN 23* 21*  CREATININE 1.26* 1.05*  CALCIUM 8.7* 8.6*   GFR: Estimated Creatinine Clearance: 51.2 mL/min (by C-G formula based on SCr of 1.05 mg/dL (H)).  Liver Function Tests:  Recent Labs Lab 07/13/16 1457  AST 17  ALT 10*  ALKPHOS 80  BILITOT 1.1  PROT 6.6  ALBUMIN 3.4*    Cardiac Enzymes:  Recent Labs Lab 07/15/16 0739 07/15/16 1134  TROPONINI <0.03 <0.03    HbA1C: Hgb A1c MFr Bld  Date/Time  Value Ref Range Status  06/09/2016 02:21 PM 9.4 (H) 4.8 - 5.6 % Final    Comment:    (NOTE)         Pre-diabetes: 5.7 - 6.4         Diabetes: >6.4         Glycemic control for adults with diabetes: <7.0   08/29/2015 04:15 PM 9.6 (H) 4.8 - 5.6 % Final    Comment:    (NOTE)         Pre-diabetes: 5.7 - 6.4         Diabetes: >6.4         Glycemic control for adults with diabetes: <7.0     CBG:  Recent Labs Lab 07/14/16 2029 07/14/16 2355 07/15/16 0357 07/15/16 0734 07/15/16 1240  GLUCAP 262* 173* 139* 112* 190*    Recent Results (from the past 240 hour(s))  Blood Culture (routine x 2)     Status: None (Preliminary result)   Collection Time: 07/13/16  2:59 PM  Result Value Ref Range Status   Specimen Description BLOOD RIGHT HAND  Final   Special Requests AEROBIC BOTTLE ONLY 5CC  Final   Culture NO GROWTH 2 DAYS  Final   Report Status PENDING  Incomplete  Blood Culture (routine x 2)     Status: None (Preliminary result)   Collection Time: 07/13/16  3:36 PM  Result Value Ref Range Status   Specimen Description BLOOD LEFT FOREARM  Final   Special Requests IN PEDIATRIC BOTTLE 2CC  Final   Culture NO GROWTH 2 DAYS  Final   Report Status PENDING  Incomplete  Urine culture     Status: Abnormal   Collection Time: 07/13/16  5:45 PM  Result Value Ref Range Status   Specimen Description URINE, CATHETERIZED  Final   Special Requests NONE  Final   Culture <10,000 COLONIES/mL INSIGNIFICANT GROWTH (A)  Final   Report Status 07/14/2016 FINAL  Final  MRSA PCR Screening     Status: None   Collection Time: 07/13/16 11:04 PM  Result Value Ref Range Status   MRSA by PCR NEGATIVE NEGATIVE Final    Comment:        The GeneXpert MRSA Assay (FDA approved for NASAL specimens only), is one component of a comprehensive MRSA colonization surveillance program. It is not intended to diagnose MRSA infection nor to guide or monitor treatment for MRSA infections.      Scheduled Meds: .  apixaban  5 mg Oral BID  . ceFEPime (MAXIPIME) IV  2 g Intravenous Q24H  . chlorhexidine  15 mL Mouth Rinse BID  . furosemide  40 mg Oral BID  . insulin aspart  0-20 Units Subcutaneous Q4H  .  insulin glargine  8 Units Subcutaneous QHS  . levETIRAcetam  500 mg Oral BID  . mouth rinse  15 mL Mouth Rinse q12n4p  . metoprolol tartrate  12.5 mg Oral BID  . rOPINIRole  2 mg Oral TID  . sodium chloride flush  3 mL Intravenous Q12H  . sodium chloride flush  3 mL Intravenous Q12H  . vancomycin  1,500 mg Intravenous Q24H     LOS: 2 days   Cherene Altes, MD [facility] Office  [phone] Pager - Text Page per Shea Evans as per below:  On-Call/Text Page:      [email or web address]      password [email or web address]  If 7PM-7AM, please contact night-coverage [email or web address] Password [email or web address] 07/15/2016, 1:49 PM

## 2016-07-15 NOTE — Progress Notes (Signed)
Patient had 5/10 chest pain on the left.  Administered 1 nitroglycerin, patient reported resolution of pain.  EKG was completed, NP notified. Will continue to monitor patient.

## 2016-07-16 LAB — CBC
HEMATOCRIT: 32.1 % — AB (ref 36.0–46.0)
Hemoglobin: 9.4 g/dL — ABNORMAL LOW (ref 12.0–15.0)
MCH: 28.6 pg (ref 26.0–34.0)
MCHC: 29.3 g/dL — ABNORMAL LOW (ref 30.0–36.0)
MCV: 97.6 fL (ref 78.0–100.0)
PLATELETS: 113 10*3/uL — AB (ref 150–400)
RBC: 3.29 MIL/uL — AB (ref 3.87–5.11)
RDW: 13.9 % (ref 11.5–15.5)
WBC: 2.4 10*3/uL — AB (ref 4.0–10.5)

## 2016-07-16 LAB — GLUCOSE, CAPILLARY
GLUCOSE-CAPILLARY: 284 mg/dL — AB (ref 65–99)
GLUCOSE-CAPILLARY: 203 mg/dL — AB (ref 65–99)
Glucose-Capillary: 358 mg/dL — ABNORMAL HIGH (ref 65–99)
Glucose-Capillary: 262 mg/dL — ABNORMAL HIGH (ref 65–99)
Glucose-Capillary: 225 mg/dL — ABNORMAL HIGH (ref 65–99)

## 2016-07-16 LAB — COMPREHENSIVE METABOLIC PANEL
ALT: 10 U/L — ABNORMAL LOW (ref 14–54)
AST: 15 U/L (ref 15–41)
Albumin: 2.8 g/dL — ABNORMAL LOW (ref 3.5–5.0)
Alkaline Phosphatase: 37 U/L — ABNORMAL LOW (ref 38–126)
Anion gap: 4 — ABNORMAL LOW (ref 5–15)
BILIRUBIN TOTAL: 1.1 mg/dL (ref 0.3–1.2)
BUN: 18 mg/dL (ref 6–20)
CHLORIDE: 99 mmol/L — AB (ref 101–111)
CO2: 36 mmol/L — ABNORMAL HIGH (ref 22–32)
Calcium: 8.5 mg/dL — ABNORMAL LOW (ref 8.9–10.3)
Creatinine, Ser: 1.1 mg/dL — ABNORMAL HIGH (ref 0.44–1.00)
GFR, EST AFRICAN AMERICAN: 50 mL/min — AB (ref >60)
GFR, EST NON AFRICAN AMERICAN: 44 mL/min — AB (ref >60)
Glucose, Bld: 303 mg/dL — ABNORMAL HIGH (ref 65–99)
POTASSIUM: 3.7 mmol/L (ref 3.5–5.1)
Sodium: 139 mmol/L (ref 135–145)
TOTAL PROTEIN: 5.6 g/dL — AB (ref 6.5–8.1)

## 2016-07-16 LAB — MAGNESIUM: Magnesium: 1.7 mg/dL (ref 1.7–2.4)

## 2016-07-16 MED ORDER — INSULIN GLARGINE 100 UNIT/ML ~~LOC~~ SOLN
15.0000 [IU] | Freq: Every day | SUBCUTANEOUS | Status: DC
  Administered 2016-07-16: 15 [IU] via SUBCUTANEOUS
  Filled 2016-07-16 (×2): qty 0.15

## 2016-07-16 MED ORDER — FUROSEMIDE 10 MG/ML IJ SOLN
60.0000 mg | Freq: Two times a day (BID) | INTRAMUSCULAR | Status: DC
  Administered 2016-07-16 – 2016-07-17 (×2): 60 mg via INTRAVENOUS
  Filled 2016-07-16 (×2): qty 6

## 2016-07-16 NOTE — Progress Notes (Addendum)
NCM left sleet study form for MD to sign on shadow chart.  Also will ask respiratory to see if they can do spirometry test here for bipap.  Also called respiratory PFT lab ,left message for a return call to see if they can do a spirometry test for this patient. NCM spoke with [name] in respiratory, she states they will come in the am and do a basic bedside spirometry test.  NCM asked MD to put order in for the PFT test for basic spirometry. After this test is done the NCM will need to contact [place] or Jermaine with [facility]

## 2016-07-16 NOTE — Progress Notes (Addendum)
Inpatient Diabetes Program Recommendations  AACE/ADA: New Consensus Statement on Inpatient Glycemic Control (2015)  Target Ranges:  Prepandial:   less than 140 mg/dL      Peak postprandial:   less than 180 mg/dL (1-2 hours)      Critically ill patients:  140 - 180 mg/dL   Lab Results  Component Value Date   GLUCAP 358 (H) 07/16/2016   HGBA1C 9.4 (H) 06/09/2016    Review of Glycemic Control  Diabetes history: DM2 Outpatient Diabetes medications: Lantus 30 + Novolog correction 0-12 units tid ac meals Current orders for Inpatient glycemic control: Lantus 15 units + Novolog correction 0-20 units tid + 0-5 units hs  Inpatient Diabetes Program Recommendations:  Noted elevated CBGS and patient's eating improved.  Please consider: -Increase Lantus to 30 units home dose -Novolog 4 units tid meal coverage if eats 50% Text page to Dr. Daleen Bo.  Will follow. Thank you, Nani Gasser. Massey Ruhland, RN, MSN, CDE Inpatient Glycemic Control Team Team Pager [phone] (8am-5pm) 07/16/2016 11:32 AM

## 2016-07-16 NOTE — Progress Notes (Signed)
Placed on bipap .Marland Kitchen Tolerating well

## 2016-07-16 NOTE — Telephone Encounter (Signed)
Pt was seen in 6/217 and was advised to have a split night sleep study to be on bipap, this was not done. Called and spoke to pt's daughter, [name]. She states pt is currently admitted and was told by the providers in the hospital the pt will not go home without a bipap. Advised [name] to contact us back if there are any issues or concerns. [name] verbalized understanding and denied any further questions or concerns at this time.   Will send to CY as FYI.

## 2016-07-16 NOTE — Progress Notes (Signed)
[facility] PROGRESS NOTE  Elizabeth Pugh PJA:[id number] DOB: [birth year] DOA: 07/13/2016 PCP: Philis Fendt, MD   Brief Narrative:  83 year old female with history of morbid obesity, CHF, DM, chronic respiratory failure with hypoxia and hypercapnia due to OHS, and atrial fibrillation who was brought to the ER for shortness of breath. In the ER she was somnolent. Her ABG showed severe respiratory acidosis with hypercapnia therefore she was placed on BiPAP. Upon admission she was noted to have concerns of possible underlying pneumonia and volume overload. She was started on Lasix and IV antibiotics. Slowly improving with iv diuresis   Subjective: The pt has been tolerating her BIPAP well each night. Reports feeling better today    Assessment & Plan:  Acute respiratory failure with hypercapnia and hypoxia. Appears to have been primarily due to OHS w/ severe hypoventilation during sleeping - no evidence of true infection (afeb, normal WBC) stopped abx   Acute on chronic systolic congestive CHF with ejection fraction of 45%. basline wgt appears to be as low as 124kg at times -cont iv diuresis changed lasix to 60BID. follow Is/Os, daily weights, electrolytes, and renal fxn - no ACEi/ARB for now until sure renal fxn stable. Cont BB  Chronic atrial fibrillation On metoprolol and eliquis as outpt - rate controlled   Diabetes type 2. ha1c-9.4. Glucose is not well controlled.  -Resumed lantus 15U + ISS. monitor   Acute encephalopathy secondary to hypercapnia. Resolved w/ use of BIPAP   History of seizures. continue Keppra  Morbid obesity - Body mass index is 46.29 kg/m.  dispo: cont iv diuresis for 24-48 hrs. Needs BiPAP  Code Status: full Family Communication: d/w patient, rn (indicate person spoken with, relationship, and if by phone, the number) Disposition Plan: pend clinical improvement    Consultants:  none  Procedures:  none  Antibiotics: Cefepime 3/12  > Vanc 3/12 > 3/13     (indicate start date, and stop date if known)  HPI/Subjective: Alert, reports feeling better. Still mild dyspnea   Objective: Vitals:   07/16/16 0327 07/16/16 0734  BP: 119/65 (!) 116/57  Pulse: 82 94  Resp: 17 19  Temp: 98 F (36.7 C) 98.2 F (36.8 C)    Intake/Output Summary (Last 24 hours) at 07/16/16 1103 Last data filed at 07/16/16 0618  Gross per 24 hour  Intake                0 ml  Output             1450 ml  Net            -1450 ml   Filed Weights   07/13/16 2022 07/14/16 0500 07/16/16 0500  Weight: 130.5 kg (287 lb 12.8 oz) 130.1 kg (286 lb 12.8 oz) 131.9 kg (290 lb 11.2 oz)    Exam:   General:  Alert.   Cardiovascular: s1,s2 rrr  Respiratory: crackles LL  Abdomen: soft, nt, nd   Musculoskeletal: BL leg edema    Data Reviewed: Basic Metabolic Panel:  Recent Labs Lab 07/13/16 1457 07/14/16 1052 07/16/16 0318  NA 140 146* 139  K 4.9 3.8 3.7  CL 98* 101 99*  CO2 34* 38* 36*  GLUCOSE 394* 111* 303*  BUN 23* 21* 18  CREATININE 1.26* 1.05* 1.10*  CALCIUM 8.7* 8.6* 8.5*  MG  --   --  1.7   Liver Function Tests:  Recent Labs Lab 07/13/16 1457 07/16/16 0318  AST 17 15  ALT 10* 10*  ALKPHOS 47 37*  BILITOT 1.1 1.1  PROT 6.6 5.6*  ALBUMIN 3.4* 2.8*   No results for input(s): LIPASE, AMYLASE in the last 168 hours. No results for input(s): AMMONIA in the last 168 hours. CBC:  Recent Labs Lab 07/13/16 1457 07/14/16 1052 07/16/16 0318  WBC 6.8 3.1* 2.4*  NEUTROABS 5.9  --   --   HGB 11.6* 9.9* 9.4*  HCT 37.6 33.1* 32.1*  MCV 99.5 100.3* 97.6  PLT 111* 107* 113*   Cardiac Enzymes:  Recent Labs Lab 07/15/16 0739 07/15/16 1134 07/15/16 1636  TROPONINI <0.03 <0.03 <0.03   BNP (last 3 results)  Recent Labs  12/11/15 1831 06/15/16 2250 07/13/16 1457  BNP 155.3* 233.2* 253.3*    ProBNP (last 3 results) No results for input(s): PROBNP in the last 8760 hours.  CBG:  Recent Labs Lab  07/15/16 1611 07/15/16 1951 07/15/16 2333 07/16/16 0325 07/16/16 0732  GLUCAP 199* 200* 235* 284* 358*    Recent Results (from the past 240 hour(s))  Blood Culture (routine x 2)     Status: None (Preliminary result)   Collection Time: 07/13/16  2:59 PM  Result Value Ref Range Status   Specimen Description BLOOD RIGHT HAND  Final   Special Requests AEROBIC BOTTLE ONLY 5CC  Final   Culture NO GROWTH 2 DAYS  Final   Report Status PENDING  Incomplete  Blood Culture (routine x 2)     Status: None (Preliminary result)   Collection Time: 07/13/16  3:36 PM  Result Value Ref Range Status   Specimen Description BLOOD LEFT FOREARM  Final   Special Requests IN PEDIATRIC BOTTLE 2CC  Final   Culture NO GROWTH 2 DAYS  Final   Report Status PENDING  Incomplete  Urine culture     Status: Abnormal   Collection Time: 07/13/16  5:45 PM  Result Value Ref Range Status   Specimen Description URINE, CATHETERIZED  Final   Special Requests NONE  Final   Culture <10,000 COLONIES/mL INSIGNIFICANT GROWTH (A)  Final   Report Status 07/14/2016 FINAL  Final  MRSA PCR Screening     Status: None   Collection Time: 07/13/16 11:04 PM  Result Value Ref Range Status   MRSA by PCR NEGATIVE NEGATIVE Final    Comment:        The GeneXpert MRSA Assay (FDA approved for NASAL specimens only), is one component of a comprehensive MRSA colonization surveillance program. It is not intended to diagnose MRSA infection nor to guide or monitor treatment for MRSA infections.      Studies: No results found.  Scheduled Meds: . apixaban  5 mg Oral BID  . chlorhexidine  15 mL Mouth Rinse BID  . furosemide  80 mg Intravenous Q12H  . insulin aspart  0-20 Units Subcutaneous TID WC  . insulin aspart  0-5 Units Subcutaneous QHS  . insulin glargine  10 Units Subcutaneous QHS  . levETIRAcetam  500 mg Oral BID  . mouth rinse  15 mL Mouth Rinse q12n4p  . metoprolol tartrate  12.5 mg Oral BID  . rOPINIRole  2 mg Oral TID   . sodium chloride flush  3 mL Intravenous Q12H   Continuous Infusions:  Principal Problem:   Acute respiratory failure with hypoxia and hypercapnia (HCC) Active Problems:   DM (diabetes mellitus), type 2, uncontrolled with complications (HCC)   CVA, old, hemiparesis (HCC)   Chronic atrial fibrillation (HCC)   Chronic diastolic heart failure (HCC)   Bilateral leg edema  Acute encephalopathy   Chronic respiratory failure with hypoxia and hypercapnia (HCC)   Acute respiratory failure with hypercapnia (HCC)    Time spent: >35 minutes     Kinnie Feil  [facility] Pager [phone]. If 7PM-7AM, please contact night-coverage at [email or web address], password [email or web address] 07/16/2016, 11:03 AM  LOS: 3 days

## 2016-07-17 ENCOUNTER — Inpatient Hospital Stay (HOSPITAL_COMMUNITY): Payer: Medicare Other

## 2016-07-17 LAB — SPIROMETRY WITH GRAPH
FEF 25-75 PRE: 0.44 L/s
FEF2575-%Pred-Pre: 38 %
FEV1-%PRED-PRE: 41 %
FEV1-PRE: 0.63 L
FEV1FVC-%Pred-Pre: 99 %
FEV6-%Pred-Pre: 46 %
FEV6-Pre: 0.86 L
FEV6FVC-%Pred-Pre: 105 %
FVC-%PRED-PRE: 44 %
FVC-Pre: 0.86 L
Pre FEV1/FVC ratio: 74 %
Pre FEV6/FVC Ratio: 100 %

## 2016-07-17 LAB — GLUCOSE, CAPILLARY
GLUCOSE-CAPILLARY: 207 mg/dL — AB (ref 65–99)
GLUCOSE-CAPILLARY: 248 mg/dL — AB (ref 65–99)
GLUCOSE-CAPILLARY: 209 mg/dL — AB (ref 65–99)
Glucose-Capillary: 241 mg/dL — ABNORMAL HIGH (ref 65–99)

## 2016-07-17 LAB — BASIC METABOLIC PANEL
Anion gap: 8 (ref 5–15)
BUN: 18 mg/dL (ref 6–20)
CHLORIDE: 96 mmol/L — AB (ref 101–111)
CO2: 37 mmol/L — ABNORMAL HIGH (ref 22–32)
CREATININE: 1.04 mg/dL — AB (ref 0.44–1.00)
Calcium: 9 mg/dL (ref 8.9–10.3)
GFR calc Af Amer: 54 mL/min — ABNORMAL LOW (ref >60)
GFR calc non Af Amer: 47 mL/min — ABNORMAL LOW (ref >60)
GLUCOSE: 217 mg/dL — AB (ref 65–99)
Potassium: 3.6 mmol/L (ref 3.5–5.1)
SODIUM: 141 mmol/L (ref 135–145)

## 2016-07-17 MED ORDER — INSULIN ASPART 100 UNIT/ML ~~LOC~~ SOLN
4.0000 [IU] | Freq: Three times a day (TID) | SUBCUTANEOUS | Status: DC
  Administered 2016-07-17 – 2016-07-19 (×6): 4 [IU] via SUBCUTANEOUS

## 2016-07-17 MED ORDER — INSULIN GLARGINE 100 UNIT/ML ~~LOC~~ SOLN
24.0000 [IU] | Freq: Every day | SUBCUTANEOUS | Status: DC
  Administered 2016-07-17: 24 [IU] via SUBCUTANEOUS
  Filled 2016-07-17 (×2): qty 0.24

## 2016-07-17 MED ORDER — FUROSEMIDE 10 MG/ML IJ SOLN
80.0000 mg | Freq: Two times a day (BID) | INTRAMUSCULAR | Status: DC
  Administered 2016-07-17 – 2016-07-18 (×2): 80 mg via INTRAVENOUS
  Filled 2016-07-17 (×2): qty 8

## 2016-07-17 NOTE — Care Management Important Message (Signed)
Important Message  Patient Details  Name: Elizabeth Pugh MRN: [id number] Date of Birth: [birth year]   Medicare Important Message Given:  Yes    Nathen May 07/17/2016, 3:52 PM

## 2016-07-17 NOTE — Progress Notes (Signed)
[place] TEAM 1 - Stepdown/ICU TEAM  SAIDE LANUZA  CHY:[id number] DOB: 01/08/1927 DOA: 07/13/2016 PCP: Philis Fendt, MD    Brief Narrative:  83 year old female with history of morbid obesity, DM, chronic respiratory failure with hypoxia and hypercapnia due to OHS, and atrial fibrillation who was brought to the ER for shortness of breath. In the ER she was somnolent. Her ABG showed severe respiratory acidosis with hypercapnia therefore she was placed on BiPAP. Upon admission she was noted to have concerns of possible underlying pneumonia and volume overload. She was started on Lasix and IV antibiotics.  Subjective: The patient is resting comfortably in bed.  She is alert oriented and quite pleasant.  She denies chest pain fevers chills nausea vomiting or abdominal pain.  Assessment & Plan:  Acute respiratory failure with hypercapnia and hypoxia Appears to have been primarily due to OHS w/ severe hypoventilation during sleeping - no evidence of true infection (afeb, normal WBC) - doing quite well now w/ QHS BIPAP    Acute on chronic systolic congestive CHF with ejection fraction of 45% basline wgt appears to be as low as 124kg at times - volume overloaded on exam - cont to diurese and follow Is/Os, daily weights, electrolytes, and renal fxn - no ACEi/ARB for now until sure renal fxn stable - remains net + 854 thus far   [employer]   07/14/16 0500 07/16/16 0500 07/17/16 0500  Weight: 130.1 kg (286 lb 12.8 oz) 131.9 kg (290 lb 11.2 oz) 130.6 kg (288 lb)    Chronic atrial fibrillation On metoprolol and eliquis as outpt - rate controlled   Diabetes type 2 CBG poorly controlled - adjust insulin regimen and follow   Acute encephalopathy secondary to hypercapnia Resolved w/ use of BIPAP   History of seizures continue Keppra  Morbid obesity - Body mass index is 46.48 kg/m.   DVT prophylaxis: eliquis Code Status: FULL CODE Family Communication: no family present at time  of exam  Disposition Plan: d/c home when home BIPAP able to be procured   Consultants:  none  Procedures: none  Antimicrobials:  Cefepime 3/12 > 3/13 Vanc 3/12 > 3/13  Objective: Blood pressure 117/65, pulse (!) 48, temperature 98.1 F (36.7 C), temperature source Oral, resp. rate 17, height 5\' 6"  (1.676 m), weight 130.6 kg (288 lb), SpO2 98 %.  Intake/Output Summary (Last 24 hours) at 07/17/16 1530 Last data filed at 07/17/16 1300  Gross per 24 hour  Intake              723 ml  Output             1400 ml  Net             -677 ml   Filed Weights   07/14/16 0500 07/16/16 0500 07/17/16 0500  Weight: 130.1 kg (286 lb 12.8 oz) 131.9 kg (290 lb 11.2 oz) 130.6 kg (288 lb)    Examination: General: No acute respiratory distress - alert and conversant  Lungs: Clear to auscultation bilaterally without wheezes - distant bs th/o  Cardiovascular: Regular rate without murmur  Abdomen: Nontender,morbidly obese, soft, bowel sounds positive, no rebound Extremities: No significant cyanosis, or clubbing - 2+ edema bilateral lower extremities  CBC:  Recent Labs Lab 07/13/16 1457 07/14/16 1052 07/16/16 0318  WBC 6.8 3.1* 2.4*  NEUTROABS 5.9  --   --   HGB 11.6* 9.9* 9.4*  HCT 37.6 33.1* 32.1*  MCV 99.5 100.3* 97.6  PLT 111* 107*  440*   Basic Metabolic Panel:  Recent Labs Lab 07/13/16 1457 07/14/16 1052 07/16/16 0318 07/17/16 0527  NA 140 146* 139 141  K 4.9 3.8 3.7 3.6  CL 98* 101 99* 96*  CO2 34* 38* 36* 37*  GLUCOSE 394* 111* 303* 217*  BUN 23* 21* 18 18  CREATININE 1.26* 1.05* 1.10* 1.04*  CALCIUM 8.7* 8.6* 8.5* 9.0  MG  --   --  1.7  --    GFR: Estimated Creatinine Clearance: 51.8 mL/min (A) (by C-G formula based on SCr of 1.04 mg/dL (H)).  Liver Function Tests:  Recent Labs Lab 07/13/16 1457 07/16/16 0318  AST 17 15  ALT 10* 10*  ALKPHOS 47 37*  BILITOT 1.1 1.1  PROT 6.6 5.6*  ALBUMIN 3.4* 2.8*    Cardiac Enzymes:  Recent Labs Lab 07/15/16 0739  07/15/16 1134 07/15/16 1636  TROPONINI <0.03 <0.03 <0.03    HbA1C: Hgb A1c MFr Bld  Date/Time Value Ref Range Status  06/09/2016 02:21 PM 9.4 (H) 4.8 - 5.6 % Final    Comment:    (NOTE)         Pre-diabetes: 5.7 - 6.4         Diabetes: >6.4         Glycemic control for adults with diabetes: <7.0   08/29/2015 04:15 PM 9.6 (H) 4.8 - 5.6 % Final    Comment:    (NOTE)         Pre-diabetes: 5.7 - 6.4         Diabetes: >6.4         Glycemic control for adults with diabetes: <7.0     CBG:  Recent Labs Lab 07/16/16 1253 07/16/16 1622 07/16/16 2016 07/17/16 0819 07/17/16 1230  GLUCAP 203* 262* 225* 207* 241*    Recent Results (from the past 240 hour(s))  Blood Culture (routine x 2)     Status: None (Preliminary result)   Collection Time: 07/13/16  2:59 PM  Result Value Ref Range Status   Specimen Description BLOOD RIGHT HAND  Final   Special Requests AEROBIC BOTTLE ONLY 5CC  Final   Culture NO GROWTH 3 DAYS  Final   Report Status PENDING  Incomplete  Blood Culture (routine x 2)     Status: None (Preliminary result)   Collection Time: 07/13/16  3:36 PM  Result Value Ref Range Status   Specimen Description BLOOD LEFT FOREARM  Final   Special Requests IN PEDIATRIC BOTTLE 2CC  Final   Culture NO GROWTH 3 DAYS  Final   Report Status PENDING  Incomplete  Urine culture     Status: Abnormal   Collection Time: 07/13/16  5:45 PM  Result Value Ref Range Status   Specimen Description URINE, CATHETERIZED  Final   Special Requests NONE  Final   Culture <10,000 COLONIES/mL INSIGNIFICANT GROWTH (A)  Final   Report Status 07/14/2016 FINAL  Final  MRSA PCR Screening     Status: None   Collection Time: 07/13/16 11:04 PM  Result Value Ref Range Status   MRSA by PCR NEGATIVE NEGATIVE Final    Comment:        The GeneXpert MRSA Assay (FDA approved for NASAL specimens only), is one component of a comprehensive MRSA colonization surveillance program. It is not intended to diagnose  MRSA infection nor to guide or monitor treatment for MRSA infections.      Scheduled Meds: . apixaban  5 mg Oral BID  . chlorhexidine  15 mL Mouth  Rinse BID  . furosemide  60 mg Intravenous BID  . insulin aspart  0-20 Units Subcutaneous TID WC  . insulin aspart  0-5 Units Subcutaneous QHS  . insulin glargine  15 Units Subcutaneous QHS  . levETIRAcetam  500 mg Oral BID  . mouth rinse  15 mL Mouth Rinse q12n4p  . metoprolol tartrate  12.5 mg Oral BID  . rOPINIRole  2 mg Oral TID  . sodium chloride flush  3 mL Intravenous Q12H     LOS: 4 days   Cherene Altes, MD [facility] Office  [phone] Pager - Text Page per Shea Evans as per below:  On-Call/Text Page:      [email or web address]      password [email or web address]  If 7PM-7AM, please contact night-coverage [email or web address] Password [email or web address] 07/17/2016, 3:30 PM

## 2016-07-17 NOTE — Progress Notes (Signed)
Patient placed on BIPAP for the night without complication. RT will continue to monitor as needed.

## 2016-07-17 NOTE — Plan of Care (Signed)
Problem: Safety: Goal: Ability to remain free from injury will improve Outcome: Progressing Patient educated on the importance of using the call bell when in need of assistance. Pt stated she will call if needed.

## 2016-07-17 NOTE — Progress Notes (Signed)
Inpatient Diabetes Program Recommendations  AACE/ADA: New Consensus Statement on Inpatient Glycemic Control (2015)  Target Ranges:  Prepandial:   less than 140 mg/dL      Peak postprandial:   less than 180 mg/dL (1-2 hours)      Critically ill patients:  140 - 180 mg/dL   Results for Elizabeth Pugh, Elizabeth Pugh (MRN [id number]) as of 07/17/2016 13:53  Ref. Range 07/16/2016 07:32 07/16/2016 12:53 07/16/2016 16:22 07/16/2016 20:16  Glucose-Capillary Latest Ref Range: 65 - 99 mg/dL 358 (H) 203 (H) 262 (H) 225 (H)   Results for Elizabeth Pugh, Elizabeth Pugh (MRN [id number]) as of 07/17/2016 13:53  Ref. Range 07/17/2016 08:19 07/17/2016 12:30  Glucose-Capillary Latest Ref Range: 65 - 99 mg/dL 207 (H) 241 (H)    Home DM Meds: Lantus 30 units QHS       Novolog 0-12 units TID  Current Insulin Orders: Lantus 15 units QHS      Novolog Resistant Correction Scale/ SSI (0-20 units) TID AC + HS       MD- Please consider the following in-hospital insulin adjustments:  1. Increase Lantus to 20 units QHS (takes 30 units QHS at home)  2. Start Novolog Meal Coverage: Novolog 4 units TID with meals (hold if pt eats <50% of meal)    --Will follow patient during hospitalization--  Wyn Quaker RN, MSN, CDE Diabetes Coordinator Inpatient Glycemic Control Team Team Pager: [phone] (8a-5p)

## 2016-07-18 LAB — CULTURE, BLOOD (ROUTINE X 2)
CULTURE: NO GROWTH
CULTURE: NO GROWTH

## 2016-07-18 LAB — COMPREHENSIVE METABOLIC PANEL
ALT: 10 U/L — ABNORMAL LOW (ref 14–54)
AST: 14 U/L — ABNORMAL LOW (ref 15–41)
Albumin: 3 g/dL — ABNORMAL LOW (ref 3.5–5.0)
Alkaline Phosphatase: 41 U/L (ref 38–126)
Anion gap: 5 (ref 5–15)
BUN: 19 mg/dL (ref 6–20)
CO2: 37 mmol/L — ABNORMAL HIGH (ref 22–32)
Calcium: 8.9 mg/dL (ref 8.9–10.3)
Chloride: 98 mmol/L — ABNORMAL LOW (ref 101–111)
Creatinine, Ser: 0.99 mg/dL (ref 0.44–1.00)
GFR calc Af Amer: 57 mL/min — ABNORMAL LOW (ref >60)
GFR calc non Af Amer: 49 mL/min — ABNORMAL LOW (ref >60)
Glucose, Bld: 219 mg/dL — ABNORMAL HIGH (ref 65–99)
Potassium: 3.4 mmol/L — ABNORMAL LOW (ref 3.5–5.1)
Sodium: 140 mmol/L (ref 135–145)
Total Bilirubin: 0.7 mg/dL (ref 0.3–1.2)
Total Protein: 5.7 g/dL — ABNORMAL LOW (ref 6.5–8.1)

## 2016-07-18 LAB — GLUCOSE, CAPILLARY
Glucose-Capillary: 214 mg/dL — ABNORMAL HIGH (ref 65–99)
Glucose-Capillary: 191 mg/dL — ABNORMAL HIGH (ref 65–99)
Glucose-Capillary: 199 mg/dL — ABNORMAL HIGH (ref 65–99)
Glucose-Capillary: 256 mg/dL — ABNORMAL HIGH (ref 65–99)

## 2016-07-18 LAB — CBC
HCT: 32.8 % — ABNORMAL LOW (ref 36.0–46.0)
Hemoglobin: 10 g/dL — ABNORMAL LOW (ref 12.0–15.0)
MCH: 29.6 pg (ref 26.0–34.0)
MCHC: 30.5 g/dL (ref 30.0–36.0)
MCV: 97 fL (ref 78.0–100.0)
PLATELETS: 142 10*3/uL — AB (ref 150–400)
RBC: 3.38 MIL/uL — ABNORMAL LOW (ref 3.87–5.11)
RDW: 14.1 % (ref 11.5–15.5)
WBC: 2.6 10*3/uL — AB (ref 4.0–10.5)

## 2016-07-18 MED ORDER — INSULIN GLARGINE 100 UNIT/ML ~~LOC~~ SOLN
30.0000 [IU] | Freq: Every day | SUBCUTANEOUS | Status: DC
  Administered 2016-07-18: 30 [IU] via SUBCUTANEOUS
  Filled 2016-07-18 (×2): qty 0.3

## 2016-07-18 MED ORDER — ALBUTEROL SULFATE (2.5 MG/3ML) 0.083% IN NEBU: 2.5000 mg | INHALATION_SOLUTION | RESPIRATORY_TRACT | Status: DC | PRN

## 2016-07-18 MED ORDER — FUROSEMIDE 10 MG/ML IJ SOLN
80.0000 mg | Freq: Three times a day (TID) | INTRAMUSCULAR | Status: AC
  Administered 2016-07-18 – 2016-07-20 (×7): 80 mg via INTRAVENOUS
  Filled 2016-07-18 (×8): qty 8

## 2016-07-18 NOTE — Plan of Care (Signed)
Problem: Education: Goal: Knowledge of [facility] Education information/materials will improve Outcome: Progressing Discussed plan of care tonight with turning and medications with some teach back displayed.

## 2016-07-18 NOTE — Progress Notes (Signed)
Orthopedic Tech Progress Note Patient Details:  Elizabeth Pugh Dec 31, 1926 [id number]  Patient ID: [name], female   DOB: [birth year], 83 y.o.   MRN: [id number]   [name] 07/18/2016, 5:00 PM Pt unable to use trapeze bar patient helper; RN notified

## 2016-07-18 NOTE — Progress Notes (Signed)
[place] TEAM 1 - Stepdown/ICU TEAM  Elizabeth Pugh  IRJ:[id number] DOB: Oct 14, 1926 DOA: 07/13/2016 PCP: Philis Fendt, MD    Brief Narrative:  83 year old female with history of morbid obesity, DM, chronic respiratory failure with hypoxia and hypercapnia due to OHS, and atrial fibrillation who was brought to the ER for shortness of breath. In the ER she was somnolent. Her ABG showed severe respiratory acidosis with hypercapnia therefore she was placed on BiPAP. Upon admission she was noted to have concerns of possible underlying pneumonia and volume overload. She was started on Lasix and IV antibiotics.  Subjective: Waiting to hear about progress in regard to arranging for home BIPAP.  Reports some weaping of SS fluid from R thigh.  Denies unilateral LE cramps/pain.  Denies cp, n/v, f/c.  Having some upper respiratory congestion.    Assessment & Plan:  Acute respiratory failure with hypercapnia and hypoxia Appears to have been primarily due to OHS w/ severe hypoventilation during sleeping - no evidence of true infection (afeb, normal WBC) - doing quite well now w/ QHS BIPAP - will be ready for d/c home as soon as home BIPAP can be confirmed - if sent home w/o BIPAP she will rapidly decompensate, and either die at home or require re-admission (i.e. She can NOT be sent home w/o BIPAP)   Acute on chronic systolic congestive CHF with ejection fraction of 45% basline wgt appears to be as low as 124kg at times - volume overloaded on exam - cont to diurese and follow Is/Os, daily weights, electrolytes, and renal fxn - no ACEi/ARB for now until sure renal fxn stable - weight is coming down, but Is/Os climbing (having trouble tracking actual UOP apparently - cont diuresis - fluid restrict - follow   Filed Weights   07/16/16 0500 07/17/16 0500 07/18/16 3016  Weight: 131.9 kg (290 lb 11.2 oz) 130.6 kg (288 lb) 127.9 kg (281 lb 15.5 oz)    Chronic atrial fibrillation On metoprolol and eliquis as  outpt - rate controlled   Diabetes type 2 CBG better controlled but not yet at goal - adjust insulin again today and folow   Acute encephalopathy secondary to hypercapnia Resolved w/ use of BIPAP   History of seizures continue Keppra  Morbid obesity - Body mass index is 45.51 kg/m.   DVT prophylaxis: eliquis Code Status: FULL CODE Family Communication: spoke w/ niece at bedside  Disposition Plan: d/c home when home BIPAP able to be procured   Consultants:  none  Procedures: none  Antimicrobials:  Cefepime 3/12 > 3/13 Vanc 3/12 > 3/13  Objective: Blood pressure 114/71, pulse 84, temperature 98.2 F (36.8 C), temperature source Oral, resp. rate 17, height 5\' 6"  (1.676 m), weight 127.9 kg (281 lb 15.5 oz), SpO2 100 %.  Intake/Output Summary (Last 24 hours) at 07/18/16 1524 Last data filed at 07/18/16 1503  Gross per 24 hour  Intake             1280 ml  Output              725 ml  Net              555 ml   Filed Weights   07/16/16 0500 07/17/16 0500 07/18/16 0109  Weight: 131.9 kg (290 lb 11.2 oz) 130.6 kg (288 lb) 127.9 kg (281 lb 15.5 oz)    Examination: General: No acute respiratory distress - alert/conversant  Lungs: Clear to auscultation bilaterally without wheezes - some upper airway  sounds transmitted th/o  Cardiovascular: Regular rate without murmur  Abdomen: Nontender,morbidly obese, soft, bowel sounds positive, no rebound Extremities: No significant cyanosis, or clubbing - 2+ edema bilateral lower extremities w/ change of chronic venous stasis B calves   CBC:  Recent Labs Lab 07/13/16 1457 07/14/16 1052 07/16/16 0318 07/18/16 0339  WBC 6.8 3.1* 2.4* 2.6*  NEUTROABS 5.9  --   --   --   HGB 11.6* 9.9* 9.4* 10.0*  HCT 37.6 33.1* 32.1* 32.8*  MCV 99.5 100.3* 97.6 97.0  PLT 111* 107* 113* 665*   Basic Metabolic Panel:  Recent Labs Lab 07/13/16 1457 07/14/16 1052 07/16/16 0318 07/17/16 0527 07/18/16 0339  NA 140 146* 139 141 140  K 4.9  3.8 3.7 3.6 3.4*  CL 98* 101 99* 96* 98*  CO2 34* 38* 36* 37* 37*  GLUCOSE 394* 111* 303* 217* 219*  BUN 23* 21* 18 18 19   CREATININE 1.26* 1.05* 1.10* 1.04* 0.99  CALCIUM 8.7* 8.6* 8.5* 9.0 8.9  MG  --   --  1.7  --   --    GFR: Estimated Creatinine Clearance: 53.8 mL/min (by C-G formula based on SCr of 0.99 mg/dL).  Liver Function Tests:  Recent Labs Lab 07/13/16 1457 07/16/16 0318 07/18/16 0339  AST 17 15 14*  ALT 10* 10* 10*  ALKPHOS 47 37* 41  BILITOT 1.1 1.1 0.7  PROT 6.6 5.6* 5.7*  ALBUMIN 3.4* 2.8* 3.0*    Cardiac Enzymes:  Recent Labs Lab 07/15/16 0739 07/15/16 1134 07/15/16 1636  TROPONINI <0.03 <0.03 <0.03    HbA1C: Hgb A1c MFr Bld  Date/Time Value Ref Range Status  06/09/2016 02:21 PM 9.4 (H) 4.8 - 5.6 % Final    Comment:    (NOTE)         Pre-diabetes: 5.7 - 6.4         Diabetes: >6.4         Glycemic control for adults with diabetes: <7.0   08/29/2015 04:15 PM 9.6 (H) 4.8 - 5.6 % Final    Comment:    (NOTE)         Pre-diabetes: 5.7 - 6.4         Diabetes: >6.4         Glycemic control for adults with diabetes: <7.0     CBG:  Recent Labs Lab 07/17/16 1230 07/17/16 1611 07/17/16 2112 07/18/16 0824 07/18/16 1252  GLUCAP 241* 248* 209* 214* 191*    Recent Results (from the past 240 hour(s))  Blood Culture (routine x 2)     Status: None   Collection Time: 07/13/16  2:59 PM  Result Value Ref Range Status   Specimen Description BLOOD RIGHT HAND  Final   Special Requests AEROBIC BOTTLE ONLY 5CC  Final   Culture NO GROWTH 5 DAYS  Final   Report Status 07/18/2016 FINAL  Final  Blood Culture (routine x 2)     Status: None   Collection Time: 07/13/16  3:36 PM  Result Value Ref Range Status   Specimen Description BLOOD LEFT FOREARM  Final   Special Requests IN PEDIATRIC BOTTLE 2CC  Final   Culture NO GROWTH 5 DAYS  Final   Report Status 07/18/2016 FINAL  Final  Urine culture     Status: Abnormal   Collection Time: 07/13/16  5:45 PM   Result Value Ref Range Status   Specimen Description URINE, CATHETERIZED  Final   Special Requests NONE  Final   Culture <10,000 COLONIES/mL INSIGNIFICANT  GROWTH (A)  Final   Report Status 07/14/2016 FINAL  Final  MRSA PCR Screening     Status: None   Collection Time: 07/13/16 11:04 PM  Result Value Ref Range Status   MRSA by PCR NEGATIVE NEGATIVE Final    Comment:        The GeneXpert MRSA Assay (FDA approved for NASAL specimens only), is one component of a comprehensive MRSA colonization surveillance program. It is not intended to diagnose MRSA infection nor to guide or monitor treatment for MRSA infections.      Scheduled Meds: . apixaban  5 mg Oral BID  . chlorhexidine  15 mL Mouth Rinse BID  . furosemide  80 mg Intravenous BID  . insulin aspart  0-20 Units Subcutaneous TID WC  . insulin aspart  0-5 Units Subcutaneous QHS  . insulin aspart  4 Units Subcutaneous TID WC  . insulin glargine  24 Units Subcutaneous QHS  . levETIRAcetam  500 mg Oral BID  . mouth rinse  15 mL Mouth Rinse q12n4p  . metoprolol tartrate  12.5 mg Oral BID  . rOPINIRole  2 mg Oral TID  . sodium chloride flush  3 mL Intravenous Q12H     LOS: 5 days   Cherene Altes, MD [facility] Office  [phone] Pager - Text Page per Shea Evans as per below:  On-Call/Text Page:      [email or web address]      password [email or web address]  If 7PM-7AM, please contact night-coverage [email or web address] Password [facility] 07/18/2016, 3:24 PM

## 2016-07-19 LAB — GLUCOSE, CAPILLARY
GLUCOSE-CAPILLARY: 166 mg/dL — AB (ref 65–99)
GLUCOSE-CAPILLARY: 180 mg/dL — AB (ref 65–99)
Glucose-Capillary: 311 mg/dL — ABNORMAL HIGH (ref 65–99)
Glucose-Capillary: 344 mg/dL — ABNORMAL HIGH (ref 65–99)
Glucose-Capillary: 190 mg/dL — ABNORMAL HIGH (ref 65–99)

## 2016-07-19 MED ORDER — INSULIN ASPART 100 UNIT/ML ~~LOC~~ SOLN
6.0000 [IU] | Freq: Three times a day (TID) | SUBCUTANEOUS | Status: DC
  Administered 2016-07-19 – 2016-07-21 (×7): 6 [IU] via SUBCUTANEOUS

## 2016-07-19 MED ORDER — INSULIN GLARGINE 100 UNIT/ML ~~LOC~~ SOLN
32.0000 [IU] | Freq: Every day | SUBCUTANEOUS | Status: DC
  Administered 2016-07-19 – 2016-07-20 (×2): 32 [IU] via SUBCUTANEOUS
  Filled 2016-07-19 (×4): qty 0.32

## 2016-07-19 NOTE — Progress Notes (Signed)
[place] TEAM 1 - Stepdown/ICU TEAM  Elizabeth Pugh  ZOX:[id number] DOB: 04/04/1927 DOA: 07/13/2016 PCP: Philis Fendt, MD    Brief Narrative:  83 year old female with history of morbid obesity, DM, chronic respiratory failure with hypoxia and hypercapnia due to OHS, and atrial fibrillation who was brought to the ER for shortness of breath. In the ER she was somnolent. Her ABG showed severe respiratory acidosis with hypercapnia therefore she was placed on BiPAP. Upon admission she was noted to have concerns of possible underlying pneumonia and volume overload. She was started on Lasix and IV antibiotics.  Subjective: Waiting to hear about progress in regard to arranging for home BIPAP.  No new complaints.  Denies f/c sob n/v or abdom pain.      Assessment & Plan:  Acute respiratory failure with hypercapnia and hypoxia Appears to have been primarily due to OHS w/ severe hypoventilation during sleeping - no evidence of true infection (afeb, normal WBC) - doing quite well now w/ QHS BIPAP - will be ready for d/c home as soon as home BIPAP can be confirmed - if sent home w/o BIPAP she will rapidly decompensate, and either die at home or require re-admission (i.e. she can NOT be sent home w/o BIPAP)   Acute on chronic systolic congestive CHF with ejection fraction of 45% basline wgt appears to be as low as 124kg at times  - cont to diurese and follow Is/Os, daily weights, electrolytes, and renal fxn - no ACEi/ARB for now until sure renal fxn stable - weight is coming down - cont diuresis - fluid restrict - follow   Filed Weights   07/17/16 0500 07/18/16 0633 07/19/16 0400  Weight: 130.6 kg (288 lb) 127.9 kg (281 lb 15.5 oz) 126.6 kg (279 lb 1.6 oz)    Chronic atrial fibrillation On metoprolol and eliquis as outpt - rate controlled   Diabetes type 2 Continue to titrate DM meds as CBG not yet at goal  Acute encephalopathy secondary to hypercapnia Resolved w/ use of BIPAP   History  of seizures continue Keppra  Morbid obesity - Body mass index is 45.05 kg/m.   DVT prophylaxis: eliquis Code Status: FULL CODE Family Communication: spoke w/ daughter at bedside  Disposition Plan: d/c home when home BIPAP able to be procured   Consultants:  none  Procedures: none  Antimicrobials:  Cefepime 3/12 > 3/13 Vanc 3/12 > 3/13  Objective: Blood pressure 124/75, pulse 93, temperature 98 F (36.7 C), temperature source Oral, resp. rate 17, height 5\' 6"  (1.676 m), weight 126.6 kg (279 lb 1.6 oz), SpO2 99 %.  Intake/Output Summary (Last 24 hours) at 07/19/16 1543 Last data filed at 07/19/16 1538  Gross per 24 hour  Intake              533 ml  Output             2000 ml  Net            -1467 ml   Filed Weights   07/17/16 0500 07/18/16 0633 07/19/16 0400  Weight: 130.6 kg (288 lb) 127.9 kg (281 lb 15.5 oz) 126.6 kg (279 lb 1.6 oz)    Examination: General: No acute respiratory distress  Lungs: Clear to auscultation bilaterally without wheezes  Cardiovascular: Regular rate without murmur  Abdomen: Nontender,morbidly obese, soft, bowel sounds positive, no rebound Extremities: 1+ edema bilateral lower extremities w/ change of chronic venous stasis B calves   CBC:  Recent Labs Lab 07/13/16  1457 07/14/16 1052 07/16/16 0318 07/18/16 0339  WBC 6.8 3.1* 2.4* 2.6*  NEUTROABS 5.9  --   --   --   HGB 11.6* 9.9* 9.4* 10.0*  HCT 37.6 33.1* 32.1* 32.8*  MCV 99.5 100.3* 97.6 97.0  PLT 111* 107* 113* 366*   Basic Metabolic Panel:  Recent Labs Lab 07/13/16 1457 07/14/16 1052 07/16/16 0318 07/17/16 0527 07/18/16 0339  NA 140 146* 139 141 140  K 4.9 3.8 3.7 3.6 3.4*  CL 98* 101 99* 96* 98*  CO2 34* 38* 36* 37* 37*  GLUCOSE 394* 111* 303* 217* 219*  BUN 23* 21* 18 18 19   CREATININE 1.26* 1.05* 1.10* 1.04* 0.99  CALCIUM 8.7* 8.6* 8.5* 9.0 8.9  MG  --   --  1.7  --   --    GFR: Estimated Creatinine Clearance: 53.5 mL/min (by C-G formula based on SCr of 0.99  mg/dL).  Liver Function Tests:  Recent Labs Lab 07/13/16 1457 07/16/16 0318 07/18/16 0339  AST 17 15 14*  ALT 10* 10* 10*  ALKPHOS 47 37* 41  BILITOT 1.1 1.1 0.7  PROT 6.6 5.6* 5.7*  ALBUMIN 3.4* 2.8* 3.0*    Cardiac Enzymes:  Recent Labs Lab 07/15/16 0739 07/15/16 1134 07/15/16 1636  TROPONINI <0.03 <0.03 <0.03    HbA1C: Hgb A1c MFr Bld  Date/Time Value Ref Range Status  06/09/2016 02:21 PM 9.4 (H) 4.8 - 5.6 % Final    Comment:    (NOTE)         Pre-diabetes: 5.7 - 6.4         Diabetes: >6.4         Glycemic control for adults with diabetes: <7.0   08/29/2015 04:15 PM 9.6 (H) 4.8 - 5.6 % Final    Comment:    (NOTE)         Pre-diabetes: 5.7 - 6.4         Diabetes: >6.4         Glycemic control for adults with diabetes: <7.0     CBG:  Recent Labs Lab 07/18/16 1737 07/18/16 2114 07/19/16 0856 07/19/16 1217 07/19/16 1540  GLUCAP 199* 256* 311* 344* 190*    Recent Results (from the past 240 hour(s))  Blood Culture (routine x 2)     Status: None   Collection Time: 07/13/16  2:59 PM  Result Value Ref Range Status   Specimen Description BLOOD RIGHT HAND  Final   Special Requests AEROBIC BOTTLE ONLY 5CC  Final   Culture NO GROWTH 5 DAYS  Final   Report Status 07/18/2016 FINAL  Final  Blood Culture (routine x 2)     Status: None   Collection Time: 07/13/16  3:36 PM  Result Value Ref Range Status   Specimen Description BLOOD LEFT FOREARM  Final   Special Requests IN PEDIATRIC BOTTLE [facility]  Final   Culture NO GROWTH 5 DAYS  Final   Report Status 07/18/2016 FINAL  Final  Urine culture     Status: Abnormal   Collection Time: 07/13/16  5:45 PM  Result Value Ref Range Status   Specimen Description URINE, CATHETERIZED  Final   Special Requests NONE  Final   Culture <10,000 COLONIES/mL INSIGNIFICANT GROWTH (A)  Final   Report Status 07/14/2016 FINAL  Final  MRSA PCR Screening     Status: None   Collection Time: 07/13/16 11:04 PM  Result Value Ref Range  Status   MRSA by PCR NEGATIVE NEGATIVE Final    Comment:  The GeneXpert MRSA Assay (FDA approved for NASAL specimens only), is one component of a comprehensive MRSA colonization surveillance program. It is not intended to diagnose MRSA infection nor to guide or monitor treatment for MRSA infections.      Scheduled Meds: . apixaban  5 mg Oral BID  . chlorhexidine  15 mL Mouth Rinse BID  . furosemide  80 mg Intravenous Q8H  . insulin aspart  0-20 Units Subcutaneous TID WC  . insulin aspart  0-5 Units Subcutaneous QHS  . insulin aspart  4 Units Subcutaneous TID WC  . insulin glargine  30 Units Subcutaneous QHS  . levETIRAcetam  500 mg Oral BID  . mouth rinse  15 mL Mouth Rinse q12n4p  . metoprolol tartrate  12.5 mg Oral BID  . rOPINIRole  2 mg Oral TID  . sodium chloride flush  3 mL Intravenous Q12H     LOS: 6 days   Cherene Altes, MD [facility] Office  [phone] Pager - Text Page per Shea Evans as per below:  On-Call/Text Page:      [email or web address]      password [email or web address]  If 7PM-7AM, please contact night-coverage [email or web address] Password [facility] 07/19/2016, 3:43 PM

## 2016-07-19 NOTE — Discharge Instructions (Signed)
Information on my medicine - ELIQUIS (apixaban)  This medication education was reviewed with me or my healthcare representative as part of my discharge preparation.  The pharmacist that spoke with me during my hospital stay was:  Delane Ginger, [facility]  Why was Eliquis prescribed for you? Eliquis was prescribed for you to reduce the risk of forming blood clots that can cause a stroke if you have a medical condition called atrial fibrillation (a type of irregular heartbeat) OR to reduce the risk of a blood clots forming after orthopedic surgery.  What do You need to know about Eliquis ? Take your Eliquis TWICE DAILY - one tablet in the morning and one tablet in the evening with or without food.  It would be best to take the doses about the same time each day.  If you have difficulty swallowing the tablet whole please discuss with your pharmacist how to take the medication safely.  Take Eliquis exactly as prescribed by your doctor and DO NOT stop taking Eliquis without talking to the doctor who prescribed the medication.  Stopping may increase your risk of developing a new clot or stroke.  Refill your prescription before you run out.  After discharge, you should have regular check-up appointments with your healthcare provider that is prescribing your Eliquis.  In the future your dose may need to be changed if your kidney function or weight changes by a significant amount or as you get older.  What do you do if you miss a dose? If you miss a dose, take it as soon as you remember on the same day and resume taking twice daily.  Do not take more than one dose of ELIQUIS at the same time.  Important Safety Information A possible side effect of Eliquis is bleeding. You should call your healthcare provider right away if you experience any of the following: ? Bleeding from an injury or your nose that does not stop. ? Unusual colored urine (red or dark brown) or unusual colored stools (red or  black). ? Unusual bruising for unknown reasons. ? A serious fall or if you hit your head (even if there is no bleeding).  Some medicines may interact with Eliquis and might increase your risk of bleeding or clotting while on Eliquis. To help avoid this, consult your healthcare provider or pharmacist prior to using any new prescription or non-prescription medications, including herbals, vitamins, non-steroidal anti-inflammatory drugs (NSAIDs) and supplements.  This website has more information on Eliquis (apixaban): [email or web address].

## 2016-07-19 NOTE — Plan of Care (Signed)
Problem: Education: Goal: Knowledge of [facility] Education information/materials will improve Outcome: Progressing Discussed plan of care for the evening and foley care with some teach back displayed

## 2016-07-19 NOTE — Evaluation (Signed)
Physical Therapy Evaluation Patient Details Name: Elizabeth Pugh MRN: [id number] DOB: [birth year] Today's Date: 07/19/2016   History of Present Illness  83 year old female with past medical history as stated above, who presents with 2-3 day history of increased somnolence with development of shortness of breath this morning and alteration in mental status. Patient has acute respiratory acidosis with hypercapnia  Clinical Impression  Patient is functioning at her baseline level - bed bound with family/aide providing total care.  No further acute PT needs identified at this time.  PT will sign off.    Follow Up Recommendations No PT follow up;Supervision/Assistance - 24 hour    Equipment Recommendations  None recommended by PT    Recommendations for Other Services       Precautions / Restrictions Precautions Precautions: Fall Restrictions Weight Bearing Restrictions: No      Mobility  Bed Mobility Overal bed mobility: Needs Assistance Bed Mobility: Rolling Rolling: +2 for physical assistance;Max assist         General bed mobility comments: encouraged patient to use bilateral UEs on bed rails, max A x2 at bilateral LEs and hips - with rolling to both sides.  Shoulder ROM limited ability to reach across body with LUE.  Transfers                 General transfer comment: Requires lift equipment at baseline  Ambulation/Gait                Stairs            Wheelchair Mobility    Modified Rankin (Stroke Patients Only)       Balance                                             Pertinent Vitals/Pain Pain Assessment: No/denies pain    Home Living Family/patient expects to be discharged to:: Private residence Living Arrangements: Children (Daughter) Available Help at Discharge: Family;Available 24 hours/day;Personal care attendant Type of Home: House Home Access: Ramped entrance     Home Layout: One level Home Equipment:  Wheelchair - power;Hospital bed;Transport chair;Electric scooter;Bedside commode (hoyer lift, trapeze, lift chair)      Prior Function Level of Independence: Needs assistance   Gait / Transfers Assistance Needed: Bedbound. Non-ambulatory x several years.  Hoyer lift for OOB.  ADL's / Homemaking Assistance Needed: Total assist for bathing, dressing, toileting (per pt, she wears a diaper and daughter changes her) has a CNA         Hand Dominance   Dominant Hand: Right    Extremity/Trunk Assessment   Upper Extremity Assessment Upper Extremity Assessment: Generalized weakness (Bil shoulders with decreased strength and ROM limited to 90*)    Lower Extremity Assessment Lower Extremity Assessment: RLE deficits/detail;LLE deficits/detail RLE Deficits / Details: gross LE strength 2/5. edema noted; pt able to lift lower leg against gravity  LLE Deficits / Details: pitting edema and reddness noted, patient able to wiggle toes and dorsiflex ankle modestly, able to lift lower leg against gravity minimally    Cervical / Trunk Assessment Cervical / Trunk Assessment:  (Increased body habitus)  Communication   Communication: No difficulties  Cognition Arousal/Alertness: Awake/alert Behavior During Therapy: WFL for tasks assessed/performed Overall Cognitive Status: Within Functional Limits for tasks assessed  General Comments      Exercises     Assessment/Plan    PT Assessment [place] does not need any further PT services  PT Problem List         PT Treatment Interventions      PT Goals (Current goals can be found in the Care Plan section)  Acute Rehab PT Goals PT Goal Formulation: All assessment and education complete, DC therapy    Frequency     Barriers to discharge        Co-evaluation               End of Session   Activity Tolerance: Patient limited by fatigue Patient left: in bed;with call bell/phone within reach Nurse  Communication: Need for lift equipment PT Visit Diagnosis: Muscle weakness (generalized) (M62.81)         Time: 1139-1150 PT Time Calculation (min) (ACUTE ONLY): 11 min   Charges:   PT Evaluation $PT Eval Low Complexity: 1 Procedure     PT G Codes:         Despina Pole 2016-08-09, 1:33 PM Carita Pian. Sanjuana Kava, [place] Pager [phone]

## 2016-07-20 LAB — COMPREHENSIVE METABOLIC PANEL
ALT: 10 U/L — ABNORMAL LOW (ref 14–54)
AST: 14 U/L — AB (ref 15–41)
Albumin: 3 g/dL — ABNORMAL LOW (ref 3.5–5.0)
Alkaline Phosphatase: 45 U/L (ref 38–126)
Anion gap: 9 (ref 5–15)
BUN: 20 mg/dL (ref 6–20)
CO2: 36 mmol/L — AB (ref 22–32)
Calcium: 9 mg/dL (ref 8.9–10.3)
Chloride: 96 mmol/L — ABNORMAL LOW (ref 101–111)
Creatinine, Ser: 1.07 mg/dL — ABNORMAL HIGH (ref 0.44–1.00)
GFR calc Af Amer: 52 mL/min — ABNORMAL LOW (ref >60)
GFR calc non Af Amer: 45 mL/min — ABNORMAL LOW (ref >60)
GLUCOSE: 244 mg/dL — AB (ref 65–99)
POTASSIUM: 3.5 mmol/L (ref 3.5–5.1)
SODIUM: 141 mmol/L (ref 135–145)
Total Bilirubin: 0.7 mg/dL (ref 0.3–1.2)
Total Protein: 5.7 g/dL — ABNORMAL LOW (ref 6.5–8.1)

## 2016-07-20 LAB — GLUCOSE, CAPILLARY
Glucose-Capillary: 205 mg/dL — ABNORMAL HIGH (ref 65–99)
Glucose-Capillary: 237 mg/dL — ABNORMAL HIGH (ref 65–99)
Glucose-Capillary: 189 mg/dL — ABNORMAL HIGH (ref 65–99)
Glucose-Capillary: 145 mg/dL — ABNORMAL HIGH (ref 65–99)

## 2016-07-20 MED ORDER — FUROSEMIDE 40 MG PO TABS
60.0000 mg | ORAL_TABLET | Freq: Two times a day (BID) | ORAL | Status: DC
  Administered 2016-07-21 (×2): 60 mg via ORAL
  Filled 2016-07-20 (×2): qty 2

## 2016-07-20 NOTE — Progress Notes (Signed)
[place] TEAM 1 - Stepdown/ICU TEAM  AQUARIUS TREMPER  YWV:[id number] DOB: [birth year] DOA: 07/13/2016 PCP: Philis Fendt, MD    Brief Narrative:  83 year old female with history of morbid obesity, DM, chronic respiratory failure with hypoxia and hypercapnia due to OHS, and atrial fibrillation who was brought to the ER for shortness of breath. In the ER she was somnolent. Her ABG showed severe respiratory acidosis with hypercapnia therefore she was placed on BiPAP. Upon admission she was noted to have concerns of possible underlying pneumonia and volume overload. She was started on Lasix and IV antibiotics.  Subjective: The patient is sitting up in bed coloring.  She is in good spirits and has no new complaints.  She is anxious to be discharged home.  Assessment & Plan:   Acute respiratory failure with hypercapnia and hypoxia Appears to have been primarily due to OHS w/ severe hypoventilation during sleeping - no evidence of true infection (afeb, normal WBC) - doing quite well now w/ QHS BIPAP - will be ready for d/c home as soon as home BIPAP can be confirmed - if sent home w/o BIPAP she will rapidly decompensate, and either die at home or require re-admission (i.e. she can NOT be sent home w/o BIPAP) - I am informed that BIPAP will be arranged and ready for home use on 3/20   Acute on chronic systolic congestive CHF with ejection fraction of 45% basline wgt appears to be as low as 124kg at times  - cont to diurese and follow Is/Os, daily weights, electrolytes, and renal fxn - no ACEi/ARB for now until sure renal fxn stable - weight is coming down - cont diuresis - fluid restrict - remove foley first thing in AM to assure pt urinates w/o it   [facility] Weights   07/18/16 0633 07/19/16 0400 07/20/16 0500  Weight: 127.9 kg (281 lb 15.5 oz) 126.6 kg (279 lb 1.6 oz) 125.6 kg (276 lb 14.4 oz)    Recent Labs Lab 07/14/16 1052 07/16/16 0318 07/17/16 0527 07/18/16 0339 07/20/16 0355    CREATININE 1.05* 1.10* 1.04* 0.99 1.07*    Chronic atrial fibrillation On metoprolol and eliquis as outpt - rate controlled   Diabetes type 2 CBG improved - follow trend w/o change in tx today   Acute encephalopathy secondary to hypercapnia Resolved w/ use of BIPAP   History of seizures continue Keppra  Morbid obesity - Body mass index is 44.69 kg/m.   DVT prophylaxis: eliquis Code Status: FULL CODE Family Communication: No family present at time of visit today Disposition Plan: d/c home when home BIPAP able to be procured (reportedly 3/20)  Consultants:  none  Procedures: none  Antimicrobials:  Cefepime 3/12 > 3/13 Vanc 3/12 > 3/13  Objective: Blood pressure 103/75, pulse (!) 107, temperature 98.1 F (36.7 C), temperature source Oral, resp. rate 15, height 5\' 6"  (1.676 m), weight 125.6 kg (276 lb 14.4 oz), SpO2 100 %.  Intake/Output Summary (Last 24 hours) at 07/20/16 1617 Last data filed at 07/20/16 1615  Gross per 24 hour  Intake              470 ml  Output             2100 ml  Net            -1630 ml   Filed Weights   07/18/16 0633 07/19/16 0400 07/20/16 0500  Weight: 127.9 kg (281 lb 15.5 oz) 126.6 kg (279 lb 1.6 oz) 125.6 kg (  276 lb 14.4 oz)    Examination: General: No acute respiratory distress  Lungs: Clear to auscultation bilaterally Cardiovascular: Regular rate  Abdomen: Nontender,morbidly obese, soft, bowel sounds positive, no rebound Extremities: 1+ edema bilateral lower extremities w/ change of chronic venous stasis B calves   CBC:  Recent Labs Lab 07/14/16 1052 07/16/16 0318 07/18/16 0339  WBC 3.1* 2.4* 2.6*  HGB 9.9* 9.4* 10.0*  HCT 33.1* 32.1* 32.8*  MCV 100.3* 97.6 97.0  PLT 107* 113* 062*   Basic Metabolic Panel:  Recent Labs Lab 07/14/16 1052 07/16/16 0318 07/17/16 0527 07/18/16 0339 07/20/16 0355  NA 146* 139 141 140 141  K 3.8 3.7 3.6 3.4* 3.5  CL 101 99* 96* 98* 96*  CO2 38* 36* 37* 37* 36*  GLUCOSE 111*  303* 217* 219* 244*  BUN 21* 18 18 19 20   CREATININE 1.05* 1.10* 1.04* 0.99 1.07*  CALCIUM 8.6* 8.5* 9.0 8.9 9.0  MG  --  1.7  --   --   --    GFR: Estimated Creatinine Clearance: 49.2 mL/min (A) (by C-G formula based on SCr of 1.07 mg/dL (H)).  Liver Function Tests:  Recent Labs Lab 07/16/16 0318 07/18/16 0339 07/20/16 0355  AST 15 14* 14*  ALT 10* 10* 10*  ALKPHOS 37* 41 45  BILITOT 1.1 0.7 0.7  PROT 5.6* 5.7* 5.7*  ALBUMIN 2.8* 3.0* 3.0*    Cardiac Enzymes:  Recent Labs Lab 07/15/16 0739 07/15/16 1134 07/15/16 1636  TROPONINI <0.03 <0.03 <0.03    HbA1C: Hgb A1c MFr Bld  Date/Time Value Ref Range Status  06/09/2016 02:21 PM 9.4 (H) 4.8 - 5.6 % Final    Comment:    (NOTE)         Pre-diabetes: 5.7 - 6.4         Diabetes: >6.4         Glycemic control for adults with diabetes: <7.0   08/29/2015 04:15 PM 9.6 (H) 4.8 - 5.6 % Final    Comment:    (NOTE)         Pre-diabetes: 5.7 - 6.4         Diabetes: >6.4         Glycemic control for adults with diabetes: <7.0     CBG:  Recent Labs Lab 07/19/16 1540 07/19/16 2124 07/19/16 2142 07/20/16 0738 07/20/16 1252  GLUCAP 190* 166* 180* 205* 237*    Recent Results (from the past 240 hour(s))  Blood Culture (routine x 2)     Status: None   Collection Time: 07/13/16  2:59 PM  Result Value Ref Range Status   Specimen Description BLOOD RIGHT HAND  Final   Special Requests AEROBIC BOTTLE ONLY 5CC  Final   Culture NO GROWTH 5 DAYS  Final   Report Status 07/18/2016 FINAL  Final  Blood Culture (routine x 2)     Status: None   Collection Time: 07/13/16  3:36 PM  Result Value Ref Range Status   Specimen Description BLOOD LEFT FOREARM  Final   Special Requests IN PEDIATRIC BOTTLE [facility]  Final   Culture NO GROWTH 5 DAYS  Final   Report Status 07/18/2016 FINAL  Final  Urine culture     Status: Abnormal   Collection Time: 07/13/16  5:45 PM  Result Value Ref Range Status   Specimen Description URINE,  CATHETERIZED  Final   Special Requests NONE  Final   Culture <10,000 COLONIES/mL INSIGNIFICANT GROWTH (A)  Final   Report Status 07/14/2016 FINAL  Final  MRSA PCR Screening     Status: None   Collection Time: 07/13/16 11:04 PM  Result Value Ref Range Status   MRSA by PCR NEGATIVE NEGATIVE Final    Comment:        The GeneXpert MRSA Assay (FDA approved for NASAL specimens only), is one component of a comprehensive MRSA colonization surveillance program. It is not intended to diagnose MRSA infection nor to guide or monitor treatment for MRSA infections.      Scheduled Meds: . apixaban  5 mg Oral BID  . chlorhexidine  15 mL Mouth Rinse BID  . furosemide  80 mg Intravenous Q8H  . insulin aspart  0-20 Units Subcutaneous TID WC  . insulin aspart  0-5 Units Subcutaneous QHS  . insulin aspart  6 Units Subcutaneous TID WC  . insulin glargine  32 Units Subcutaneous QHS  . levETIRAcetam  500 mg Oral BID  . mouth rinse  15 mL Mouth Rinse q12n4p  . metoprolol tartrate  12.5 mg Oral BID  . rOPINIRole  2 mg Oral TID  . sodium chloride flush  3 mL Intravenous Q12H     LOS: 7 days   Cherene Altes, MD [facility] Office  [phone] Pager - Text Page per Shea Evans as per below:  On-Call/Text Page:      [email or web address]      password [email or web address]  If 7PM-7AM, please contact night-coverage [email or web address] Password [email or web address] 07/20/2016, 4:17 PM

## 2016-07-20 NOTE — Evaluation (Addendum)
Occupational Therapy Re-Evaluation Patient Details Name: Elizabeth Pugh MRN: [id number] DOB: [birth year] Today's Date: 07/20/2016    History of Present Illness 83 year old female with PHx: morbid obesity, DM, chronic respiratory failure with hypoxia and hypercapnia due to OHS, and A-fib presents with 2-3 day history of increased somnolence with development of shortness of breath this morning and alteration in mental status. Patient has acute respiratory acidosis with hypercapnia   Clinical Impression   This 83 yo female admitted per above presents to acute OT with ability to roll left and right with less A than she needed on initial OT eval; however still needing a lot of A for bed mobility. Seen for re-eval today to see if any suggestions could be made to make self care easier on family; however even though pt is a little weak in her arms and with decreased AROM shoulders it is really her decreased trunk strength and decreased movement in her LEs that are affecting her bed mobility. I encourage pt to keep moving herself as much as possible in bed and this will help her from getting stiff and make it easier for to move in general. No further OT needs, we will sign off    Follow Up Recommendations  No OT follow up;Supervision/Assistance - 24 hour;Other (comment) (HHAide hours increased)    Equipment Recommendations  None recommended by OT       Precautions / Restrictions Precautions Precautions: Fall Restrictions Weight Bearing Restrictions: No      Mobility Bed Mobility Overal bed mobility: Needs Assistance Bed Mobility: Rolling           General bed mobility comments: Pt able to roll left with Mod A with verbal cues to cross RLE over LLE before reaching for rail to roll left and vcs to bring roll her hip over as well--tends to just want to roll UB--pt able to do more with each repetition (10) but still needing Mod A. Pt max A to roll right with intermitten physical A to reach rail  with LUE and then increased A to get her to try and roll hips too as well as vcs to cross her LLE over her RLE before attempting to roll            ADL Overall ADL's : At baseline                                       General ADL Comments: pt still able to self feed and perform some grooming at bed level.     Vision Patient Visual Report: No change from baseline              Pertinent Vitals/Pain Pain Assessment:  (hips and knees with attempt at PROM beyond AROM; repositioned and no pain)     Hand Dominance Right   Extremity/Trunk Assessment Upper Extremity Assessment Upper Extremity Assessment:  (Bil UEs with limited AROM beyond 90 degrees flexion; she can A with pulling herself over to roll right and left; but sometimes needs A to intially reach bed rail with RUE due to body habitus)              Cognition Arousal/Alertness: Awake/alert Behavior During Therapy: WFL for tasks assessed/performed Overall Cognitive Status: Within Functional Limits for tasks assessed  Exercises   Other Exercises Other Exercises: Pt had perform supine in bed 10 reps of resistive shoulder horizontal abduction to simulate reaching and pulling on bed rail. 10 reps of what she could do of AAROM bending up of knees (more limited on LLE than RLE) and 10 reps of hip abduction and adduction.     Home Living Family/patient expects to be discharged to:: Private residence Living Arrangements: Children (daughter) Available Help at Discharge: Family;Available 24 hours/day;Personal care attendant Type of Home: House Home Access: Ramped entrance     Home Layout: One level     Bathroom Shower/Tub:  (sponge bath due to bedbound)   Bathroom Toilet: Handicapped height (but uses Depends--does not get up on 3n1)     Home Equipment: Wheelchair - power;Hospital bed;Transport chair;Electric scooter;Bedside commode;Other (comment) (hoyer lift, bed  trapeze, lift chair)   Additional Comments: patient's family help her bathe and perform all aspects of self care other than feeding and some grooming.      Prior Functioning/Environment Level of Independence: Needs assistance  Gait / Transfers Assistance Needed: Bedbound. Non-ambulatory x several years.  Hoyer lift for OOB-- pt reports she only gets OOB about once a week ADL's / Homemaking Assistance Needed: Total assist for bathing, dressing, toileting (per pt, she wears a diaper and daughter changes her) has a CNA             OT Problem List: Decreased strength;Obesity;Impaired UE functional use         OT Goals(Current goals can be found in the care plan section) Acute Rehab OT Goals Patient Stated Goal: to go home with more help  OT Frequency:                End of Session    Activity Tolerance: Patient tolerated treatment well Patient left: in bed;with call bell/phone within reach;with bed alarm set  OT Visit Diagnosis: Muscle weakness (generalized) (M62.81)                ADL either performed or assessed with clinical judgement  Time: 7989-2119 OT Time Calculation (min): 31 min Charges:  OT General Charges $OT Visit: 1 Procedure OT Evaluation $OT Eval Moderate Complexity: 1 Procedure OT Treatments $Therapeutic Activity: 8-22 mins Golden Circle, OTR/L [phone] 07/20/2016

## 2016-07-20 NOTE — Care Management Important Message (Signed)
Important Message  Patient Details  Name: Elizabeth Pugh MRN: [id number] Date of Birth: 1926-10-07   Medicare Important Message Given:  Yes    Elizabeth Pugh 07/20/2016, 1:03 PM

## 2016-07-21 DIAGNOSIS — J9601 Acute respiratory failure with hypoxia: Secondary | ICD-10-CM

## 2016-07-21 DIAGNOSIS — I5032 Chronic diastolic (congestive) heart failure: Secondary | ICD-10-CM

## 2016-07-21 DIAGNOSIS — J9611 Chronic respiratory failure with hypoxia: Secondary | ICD-10-CM

## 2016-07-21 DIAGNOSIS — J9612 Chronic respiratory failure with hypercapnia: Secondary | ICD-10-CM

## 2016-07-21 DIAGNOSIS — I69359 Hemiplegia and hemiparesis following cerebral infarction affecting unspecified side: Secondary | ICD-10-CM

## 2016-07-21 DIAGNOSIS — J9621 Acute and chronic respiratory failure with hypoxia: Secondary | ICD-10-CM

## 2016-07-21 DIAGNOSIS — J9622 Acute and chronic respiratory failure with hypercapnia: Secondary | ICD-10-CM

## 2016-07-21 DIAGNOSIS — G9340 Encephalopathy, unspecified: Secondary | ICD-10-CM

## 2016-07-21 DIAGNOSIS — E118 Type 2 diabetes mellitus with unspecified complications: Secondary | ICD-10-CM

## 2016-07-21 DIAGNOSIS — R600 Localized edema: Secondary | ICD-10-CM

## 2016-07-21 DIAGNOSIS — E1165 Type 2 diabetes mellitus with hyperglycemia: Secondary | ICD-10-CM

## 2016-07-21 DIAGNOSIS — J9602 Acute respiratory failure with hypercapnia: Secondary | ICD-10-CM

## 2016-07-21 DIAGNOSIS — Z794 Long term (current) use of insulin: Secondary | ICD-10-CM

## 2016-07-21 LAB — BASIC METABOLIC PANEL
Anion gap: 9 (ref 5–15)
BUN: 16 mg/dL (ref 6–20)
CHLORIDE: 94 mmol/L — AB (ref 101–111)
CO2: 39 mmol/L — AB (ref 22–32)
CREATININE: 1.11 mg/dL — AB (ref 0.44–1.00)
Calcium: 9.2 mg/dL (ref 8.9–10.3)
GFR calc Af Amer: 50 mL/min — ABNORMAL LOW (ref >60)
GFR calc non Af Amer: 43 mL/min — ABNORMAL LOW (ref >60)
GLUCOSE: 116 mg/dL — AB (ref 65–99)
Potassium: 3.6 mmol/L (ref 3.5–5.1)
Sodium: 142 mmol/L (ref 135–145)

## 2016-07-21 LAB — GLUCOSE, CAPILLARY
Glucose-Capillary: 115 mg/dL — ABNORMAL HIGH (ref 65–99)
Glucose-Capillary: 196 mg/dL — ABNORMAL HIGH (ref 65–99)
Glucose-Capillary: 193 mg/dL — ABNORMAL HIGH (ref 65–99)

## 2016-07-21 MED ORDER — POTASSIUM CHLORIDE CRYS ER 20 MEQ PO TBCR
20.0000 meq | EXTENDED_RELEASE_TABLET | Freq: Every day | ORAL | Status: DC
  Administered 2016-07-21: 20 meq via ORAL
  Filled 2016-07-21: qty 1

## 2016-07-21 MED ORDER — FUROSEMIDE 40 MG PO TABS: 60.0000 mg | ORAL_TABLET | Freq: Two times a day (BID) | ORAL | 0 refills | Status: DC

## 2016-07-21 MED ORDER — POTASSIUM CHLORIDE CRYS ER 20 MEQ PO TBCR: 20.0000 meq | EXTENDED_RELEASE_TABLET | Freq: Every day | ORAL | 0 refills | Status: AC

## 2016-07-21 NOTE — Progress Notes (Signed)
Patient discharged after BIPAP brought to room and education provided. Patient given discharge summary and education. Daughter was educated in facility and was home in anticipation of patient's arrival. Was transported by [place] ([facility]) to home with belongings and in paper gown

## 2016-07-21 NOTE — Progress Notes (Signed)
Pt placed on BIPAP for bedtime per MD order. Pt tolerating well at this time. RT will monitor throughout night.

## 2016-07-21 NOTE — Discharge Summary (Signed)
Physician Discharge Summary  Elizabeth Pugh OAC:[id number] DOB: 03/30/1927 DOA: 07/13/2016  PCP: Philis Fendt, MD  Admit date: 07/13/2016 Discharge date: 07/21/2016  Admitted From: Home Disposition: Home  Recommendations for Outpatient Follow-up:  1. Follow up with PCP in 1-2 weeks 2. Please obtain BMP/CBC in one week. 3. BiPAP at night, Lasix increased to 60 mg twice a day, added K Dur 20 mEq daily  Home Health: DME BiPAP Equipment/Devices:NA  Discharge Condition: Stable CODE STATUS: Full Code Diet recommendation: Diet Carb Modified Fluid consistency: Thin; Room service appropriate? Yes; Fluid restriction: 1800 mL Fluid Diet - low sodium heart healthy  Brief/Interim Summary: 83 year old female with history of morbid obesity, DM, chronic respiratory failure with hypoxia and hypercapnia due to OHS, and atrial fibrillation who was brought to the ER for shortness of breath. In the ER she was somnolent. Her ABG showed severe respiratory acidosis with hypercapnia therefore she was placed on BiPAP. Upon admission she was noted to have concerns of possible underlying pneumonia and volume overload. She was started on Lasix and IV antibiotics.  Patient was transferred twice service on Lasix a.m., recommendation from physician who saw her yesterday to discharge today with BiPAP at night.  Discharge Diagnoses:  Principal Problem:   Acute respiratory failure with hypoxia and hypercapnia (HCC) Active Problems:   DM (diabetes mellitus), type 2, uncontrolled with complications (HCC)   CVA, old, hemiparesis (HCC)   Chronic atrial fibrillation (HCC)   Chronic diastolic heart failure (HCC)   Bilateral leg edema   Acute encephalopathy   Chronic respiratory failure with hypoxia and hypercapnia (HCC)   Acute respiratory failure with hypercapnia (HCC)   Acute respiratory failure with hypercapnia and hypoxia Appears to have been primarily due to OHS w/ severe hypoventilation during  sleeping. Per previous notes this is likely fluid overload secondary to acute CHF plus sleep apnea. Patient decompensated quickly at night and needs BiPAP. She is on 2-3 L of oxygen at daytime.   Her morbid obesity with her CHF she is likely severely pickwickian and has apnea/hypopnea syndrome.   Acute on chronic systolic congestive CHF with ejection fraction of 45% basline wgt appears to be as low as 124kg at times  - cont to diurese and follow Is/Os, daily weights, electrolytes, and renal fxn - no ACEi/ARB for now until sure renal fxn stable Diuresed with IV Lasix, weight went up to 290 and now is 267. Lasix increased to 60 mg twice a day from 40. If she still has fluid overload consider 80 twice a day.  Chronic atrial fibrillation On metoprolol, rate is controlled. CHA2DS2-VASc is at least 5 5, female, age +82, DM and HTN  Diabetes type 2 CBG improved - follow trend w/o change in tx today   Acute encephalopathy secondary to hypercapnia Resolved w/ use of BIPAP   History of seizures continue Keppra  Morbid obesity - Body mass index is 44.69 kg/m.   Discharge Instructions  Discharge Instructions    Diet - low sodium heart healthy    Complete by:  As directed    Increase activity slowly    Complete by:  As directed      Allergies as of 07/21/2016      Reactions   Erythromycin Other (See Comments)   Vaginal itching   Penicillins Rash, Other (See Comments)   Has patient had a PCN reaction causing immediate rash, facial/tongue/throat swelling, SOB or lightheadedness with hypotension: No Has patient had a PCN reaction causing severe rash involving mucus  membranes or skin necrosis: No Has patient had a PCN reaction that required hospitalization: No Has patient had a PCN reaction occurring within the last 10 years: No If all of the above answers are "NO", then may proceed with Cephalosporin use.   Zithromax [azithromycin] Other (See Comments)   gave her a yeast infection.       Medication List    TAKE these medications   albuterol 108 (90 Base) MCG/ACT inhaler Commonly known as:  PROVENTIL HFA;VENTOLIN HFA Inhale 1 puff into the lungs every 6 (six) hours as needed for wheezing or shortness of breath.   ALLERGY EYE OP Place 1 drop into both eyes as needed (For eye irritation.).   apixaban 5 MG Tabs tablet Commonly known as:  ELIQUIS Take 1 tablet (5 mg total) by mouth 2 (two) times daily.   CALCIUM 500+D 500-400 MG-UNIT tablet Generic drug:  calcium-vitamin D Take 1 tablet by mouth 2 (two) times daily.   dextromethorphan-guaiFENesin 30-600 MG 12hr tablet Commonly known as:  MUCINEX DM Take 1 tablet by mouth 2 (two) times daily. Take for 1 week   ferrous sulfate 325 (65 FE) MG tablet Take 325 mg by mouth daily with supper.   furosemide 40 MG tablet Commonly known as:  LASIX Take 1.5 tablets (60 mg total) by mouth 2 (two) times daily. What changed:  how much to take   hydrOXYzine 25 MG tablet Commonly known as:  ATARAX/VISTARIL Take 25 mg by mouth every 8 (eight) hours as needed for itching.   insulin aspart 100 UNIT/ML injection Commonly known as:  novoLOG Inject 0-12 Units into the skin as needed for high blood sugar. 0-150 = 0 units, 151-200 = 2 units, 201-250 = 4 units, 251-300 = 6 units, 301-350 = 8 units, 351-400 = 10 units, 400+ = 12 units and notify NP/MD   insulin glargine 100 UNIT/ML injection Commonly known as:  LANTUS Inject 0.2 mLs (20 Units total) into the skin at bedtime. What changed:  how much to take   ipratropium-albuterol 0.5-2.5 (3) MG/3ML Soln Commonly known as:  DUONEB Take 3 mLs by nebulization. Every 6 hours while awake x5 days, then q6h prn for dyspnea and wheezing   levETIRAcetam 500 MG tablet Commonly known as:  KEPPRA Take 1 tablet (500 mg total) by mouth 2 (two) times daily.   metoprolol tartrate 25 MG tablet Commonly known as:  LOPRESSOR Take 0.5 tablets (12.5 mg total) by mouth 2 (two) times daily.    pantoprazole 40 MG tablet Commonly known as:  PROTONIX Take 1 tablet (40 mg total) by mouth 2 (two) times daily before a meal.   polyethylene glycol packet Commonly known as:  MIRALAX / GLYCOLAX Take 17 g by mouth daily as needed for mild constipation.   potassium chloride SA 20 MEQ tablet Commonly known as:  K-DUR,KLOR-CON Take 1 tablet (20 mEq total) by mouth daily.   pregabalin 75 MG capsule Commonly known as:  LYRICA Take 75 mg by mouth at bedtime.   rOPINIRole 2 MG tablet Commonly known as:  REQUIP Take 1 tablet (2 mg total) by mouth 3 (three) times daily.   STOOL SOFTENER PO Take 100 mg by mouth daily as needed (For constipation.). Colace   Vitamin D (Ergocalciferol) 50000 units Caps capsule Commonly known as:  DRISDOL Take 50,000 Units by mouth every Saturday. No specific day   VOLTAREN 1 % Gel Generic drug:  diclofenac sodium Apply 1 application topically 4 (four) times daily as needed (For knee  pain.).      Follow-up [facility].   Why:  BiPAP, will be delivered to room before the patient leaves. Please verify patient has this equipment prior to releasing from the hospital.  Contact information: [address] [address] [place] [facility].   Specialty:  Home Health Services Why:  For home health services, they will call in next 1-2 days for next appointment. Contact information: [address] [place] [address] [phone]          Allergies  Allergen Reactions  . Erythromycin Other (See Comments)    Vaginal itching  . Penicillins Rash and Other (See Comments)    Has patient had a PCN reaction causing immediate rash, facial/tongue/throat swelling, SOB or lightheadedness with hypotension: No Has patient had a PCN reaction causing severe rash involving mucus membranes or skin necrosis: No Has patient had a PCN reaction that required hospitalization:  No Has patient had a PCN reaction occurring within the last 10 years: No If all of the above answers are "NO", then may proceed with Cephalosporin use.  Marland Kitchen Zithromax [Azithromycin] Other (See Comments)    gave her a yeast infection.    Consultations:  None  Procedures (Echo, Carotid, EGD, Colonoscopy, ERCP)   Radiological studies: Dg Chest Port 1 View  Result Date: 07/13/2016 CLINICAL DATA:  Altered mental status. EXAM: PORTABLE CHEST 1 VIEW COMPARISON:  June 15, 2016 FINDINGS: Cardiomegaly. The hila and mediastinum are stable. No pneumothorax. Small bilateral pleural effusions, right greater than left. Opacity in the left base has decreased in the interval. There is still mild patchy opacity in the right mid and lower lung. IMPRESSION: Small bilateral pleural effusions, right greater than left. Interval resolution of left-sided opacity. Mild patchy opacity in the right mid and lower lung. Electronically Signed   By: Dorise Bullion III M.D   On: 07/13/2016 15:22     Subjective:  Discharge Exam: Vitals:   07/20/16 2235 07/21/16 0000 07/21/16 0400 07/21/16 1010  BP: 124/61 125/69 136/72 96/68  Pulse: 72 81 84 80  Resp: 19 19 19    Temp:  98.4 F (36.9 C) 97.7 F (36.5 C)   TempSrc:  Axillary Axillary   SpO2: 100% 100% 99%   Weight:      Height:       General: Pt is alert, awake, not in acute distress Cardiovascular: RRR, S1/S2 +, no rubs, no gallops Respiratory: CTA bilaterally, no wheezing, no rhonchi Abdominal: Soft, NT, ND, bowel sounds + Extremities: no edema, no cyanosis   The results of significant diagnostics from this hospitalization (including imaging, microbiology, ancillary and laboratory) are listed below for reference.    Microbiology: Recent Results (from the past 240 hour(s))  Blood Culture (routine x 2)     Status: None   Collection Time: 07/13/16  2:59 PM  Result Value Ref Range Status   Specimen Description BLOOD RIGHT HAND  Final   Special  Requests AEROBIC BOTTLE ONLY 5CC  Final   Culture NO GROWTH 5 DAYS  Final   Report Status 07/18/2016 FINAL  Final  Blood Culture (routine x 2)     Status: None   Collection Time: 07/13/16  3:36 PM  Result Value Ref Range Status   Specimen Description BLOOD LEFT FOREARM  Final   Special Requests IN PEDIATRIC BOTTLE [facility]  Final   Culture  NO GROWTH 5 DAYS  Final   Report Status 07/18/2016 FINAL  Final  Urine culture     Status: Abnormal   Collection Time: 07/13/16  5:45 PM  Result Value Ref Range Status   Specimen Description URINE, CATHETERIZED  Final   Special Requests NONE  Final   Culture <10,000 COLONIES/mL INSIGNIFICANT GROWTH (A)  Final   Report Status 07/14/2016 FINAL  Final  MRSA PCR Screening     Status: None   Collection Time: 07/13/16 11:04 PM  Result Value Ref Range Status   MRSA by PCR NEGATIVE NEGATIVE Final    Comment:        The GeneXpert MRSA Assay (FDA approved for NASAL specimens only), is one component of a comprehensive MRSA colonization surveillance program. It is not intended to diagnose MRSA infection nor to guide or monitor treatment for MRSA infections.      Labs: BNP (last 3 results)  Recent Labs  12/11/15 1831 06/15/16 2250 07/13/16 1457  BNP 155.3* 233.2* 875.6*   Basic Metabolic Panel:  Recent Labs Lab 07/16/16 0318 07/17/16 0527 07/18/16 0339 07/20/16 0355 07/21/16 0243  NA 139 141 140 141 142  K 3.7 3.6 3.4* 3.5 3.6  CL 99* 96* 98* 96* 94*  CO2 36* 37* 37* 36* 39*  GLUCOSE 303* 217* 219* 244* 116*  BUN 18 18 19 20 16   CREATININE 1.10* 1.04* 0.99 1.07* 1.11*  CALCIUM 8.5* 9.0 8.9 9.0 9.2  MG 1.7  --   --   --   --    Liver Function Tests:  Recent Labs Lab 07/16/16 0318 07/18/16 0339 07/20/16 0355  AST 15 14* 14*  ALT 10* 10* 10*  ALKPHOS 37* 41 45  BILITOT 1.1 0.7 0.7  PROT 5.6* 5.7* 5.7*  ALBUMIN 2.8* 3.0* 3.0*   No results for input(s): LIPASE, AMYLASE in the last 168 hours. No results for input(s): AMMONIA in  the last 168 hours. CBC:  Recent Labs Lab 07/16/16 0318 07/18/16 0339  WBC 2.4* 2.6*  HGB 9.4* 10.0*  HCT 32.1* 32.8*  MCV 97.6 97.0  PLT 113* 142*   Cardiac Enzymes:  Recent Labs Lab 07/15/16 0739 07/15/16 1134 07/15/16 1636  TROPONINI <0.03 <0.03 <0.03   BNP: Invalid input(s): POCBNP CBG:  Recent Labs Lab 07/20/16 0738 07/20/16 1252 07/20/16 1614 07/20/16 2015 07/21/16 0752  GLUCAP 205* 237* 189* 145* 115*   D-Dimer No results for input(s): DDIMER in the last 72 hours. Hgb A1c No results for input(s): HGBA1C in the last 72 hours. Lipid Profile No results for input(s): CHOL, HDL, LDLCALC, TRIG, CHOLHDL, LDLDIRECT in the last 72 hours. Thyroid function studies No results for input(s): TSH, T4TOTAL, T3FREE, THYROIDAB in the last 72 hours.  Invalid input(s): FREET3 Anemia work up No results for input(s): VITAMINB12, FOLATE, FERRITIN, TIBC, IRON, RETICCTPCT in the last 72 hours. Urinalysis    Component Value Date/Time   COLORURINE YELLOW 07/13/2016 1745   APPEARANCEUR CLEAR 07/13/2016 1745   LABSPEC 1.008 07/13/2016 1745   PHURINE 5.0 07/13/2016 1745   GLUCOSEU >=500 (A) 07/13/2016 1745   HGBUR SMALL (A) 07/13/2016 1745   BILIRUBINUR NEGATIVE 07/13/2016 1745   KETONESUR NEGATIVE 07/13/2016 1745   PROTEINUR NEGATIVE 07/13/2016 1745   UROBILINOGEN 1.0 01/19/2015 2241   NITRITE NEGATIVE 07/13/2016 1745   LEUKOCYTESUR TRACE (A) 07/13/2016 1745   Sepsis Labs Invalid input(s): PROCALCITONIN,  WBC,  LACTICIDVEN Microbiology Recent Results (from the past 240 hour(s))  Blood Culture (routine x 2)  Status: None   Collection Time: 07/13/16  2:59 PM  Result Value Ref Range Status   Specimen Description BLOOD RIGHT HAND  Final   Special Requests AEROBIC BOTTLE ONLY 5CC  Final   Culture NO GROWTH 5 DAYS  Final   Report Status 07/18/2016 FINAL  Final  Blood Culture (routine x 2)     Status: None   Collection Time: 07/13/16  3:36 PM  Result Value Ref Range  Status   Specimen Description BLOOD LEFT FOREARM  Final   Special Requests IN PEDIATRIC BOTTLE 2CC  Final   Culture NO GROWTH 5 DAYS  Final   Report Status 07/18/2016 FINAL  Final  Urine culture     Status: Abnormal   Collection Time: 07/13/16  5:45 PM  Result Value Ref Range Status   Specimen Description URINE, CATHETERIZED  Final   Special Requests NONE  Final   Culture <10,000 COLONIES/mL INSIGNIFICANT GROWTH (A)  Final   Report Status 07/14/2016 FINAL  Final  MRSA PCR Screening     Status: None   Collection Time: 07/13/16 11:04 PM  Result Value Ref Range Status   MRSA by PCR NEGATIVE NEGATIVE Final    Comment:        The GeneXpert MRSA Assay (FDA approved for NASAL specimens only), is one component of a comprehensive MRSA colonization surveillance program. It is not intended to diagnose MRSA infection nor to guide or monitor treatment for MRSA infections.      Time coordinating discharge: Over 30 minutes  SIGNED:   Birdie Hopes, MD  [facility] 07/21/2016, 11:15 AM Pager   If 7PM-7AM, please contact night-coverage [email or web address] Password [email or web address]

## 2016-08-05 ENCOUNTER — Ambulatory Visit (HOSPITAL_BASED_OUTPATIENT_CLINIC_OR_DEPARTMENT_OTHER): Payer: Medicare Other | Attending: Adult Health | Admitting: Pulmonary Disease

## 2016-08-05 VITALS — Ht 66.0 in | Wt 280.0 lb

## 2016-08-05 DIAGNOSIS — J9611 Chronic respiratory failure with hypoxia: Secondary | ICD-10-CM | POA: Insufficient documentation

## 2016-08-05 DIAGNOSIS — J9612 Chronic respiratory failure with hypercapnia: Secondary | ICD-10-CM | POA: Diagnosis present

## 2016-08-10 ENCOUNTER — Emergency Department (HOSPITAL_COMMUNITY): Payer: Medicare Other

## 2016-08-10 ENCOUNTER — Inpatient Hospital Stay (HOSPITAL_COMMUNITY): Payer: Medicare Other

## 2016-08-10 ENCOUNTER — Inpatient Hospital Stay (HOSPITAL_COMMUNITY)
Admission: EM | Admit: 2016-08-10 | Discharge: 2016-08-14 | DRG: 871 | Disposition: A | Discharge status: Home Health | Payer: Medicare Other | Attending: Internal Medicine | Admitting: Internal Medicine

## 2016-08-10 ENCOUNTER — Encounter (HOSPITAL_COMMUNITY): Payer: Self-pay

## 2016-08-10 DIAGNOSIS — Z841 Family history of disorders of kidney and ureter: Secondary | ICD-10-CM

## 2016-08-10 DIAGNOSIS — Z7401 Bed confinement status: Secondary | ICD-10-CM | POA: Diagnosis not present

## 2016-08-10 DIAGNOSIS — K219 Gastro-esophageal reflux disease without esophagitis: Secondary | ICD-10-CM | POA: Diagnosis present

## 2016-08-10 DIAGNOSIS — Z6841 Body Mass Index (BMI) 40.0 and over, adult: Secondary | ICD-10-CM

## 2016-08-10 DIAGNOSIS — I878 Other specified disorders of veins: Secondary | ICD-10-CM | POA: Diagnosis present

## 2016-08-10 DIAGNOSIS — Z87891 Personal history of nicotine dependence: Secondary | ICD-10-CM

## 2016-08-10 DIAGNOSIS — I5042 Chronic combined systolic (congestive) and diastolic (congestive) heart failure: Secondary | ICD-10-CM

## 2016-08-10 DIAGNOSIS — I10 Essential (primary) hypertension: Secondary | ICD-10-CM | POA: Diagnosis not present

## 2016-08-10 DIAGNOSIS — E118 Type 2 diabetes mellitus with unspecified complications: Secondary | ICD-10-CM | POA: Diagnosis not present

## 2016-08-10 DIAGNOSIS — Z66 Do not resuscitate: Secondary | ICD-10-CM | POA: Diagnosis present

## 2016-08-10 DIAGNOSIS — E1165 Type 2 diabetes mellitus with hyperglycemia: Secondary | ICD-10-CM | POA: Diagnosis not present

## 2016-08-10 DIAGNOSIS — Z86711 Personal history of pulmonary embolism: Secondary | ICD-10-CM

## 2016-08-10 DIAGNOSIS — Z8249 Family history of ischemic heart disease and other diseases of the circulatory system: Secondary | ICD-10-CM

## 2016-08-10 DIAGNOSIS — Z7901 Long term (current) use of anticoagulants: Secondary | ICD-10-CM

## 2016-08-10 DIAGNOSIS — M79609 Pain in unspecified limb: Secondary | ICD-10-CM | POA: Diagnosis not present

## 2016-08-10 DIAGNOSIS — Z9981 Dependence on supplemental oxygen: Secondary | ICD-10-CM | POA: Diagnosis not present

## 2016-08-10 DIAGNOSIS — J9621 Acute and chronic respiratory failure with hypoxia: Secondary | ICD-10-CM | POA: Diagnosis present

## 2016-08-10 DIAGNOSIS — I69359 Hemiplegia and hemiparesis following cerebral infarction affecting unspecified side: Secondary | ICD-10-CM | POA: Diagnosis not present

## 2016-08-10 DIAGNOSIS — A419 Sepsis, unspecified organism: Secondary | ICD-10-CM | POA: Diagnosis present

## 2016-08-10 DIAGNOSIS — J9622 Acute and chronic respiratory failure with hypercapnia: Secondary | ICD-10-CM

## 2016-08-10 DIAGNOSIS — Z880 Allergy status to penicillin: Secondary | ICD-10-CM

## 2016-08-10 DIAGNOSIS — G40909 Epilepsy, unspecified, not intractable, without status epilepticus: Secondary | ICD-10-CM | POA: Diagnosis not present

## 2016-08-10 DIAGNOSIS — Z825 Family history of asthma and other chronic lower respiratory diseases: Secondary | ICD-10-CM

## 2016-08-10 DIAGNOSIS — L03116 Cellulitis of left lower limb: Secondary | ICD-10-CM | POA: Diagnosis present

## 2016-08-10 DIAGNOSIS — R4182 Altered mental status, unspecified: Secondary | ICD-10-CM | POA: Diagnosis present

## 2016-08-10 DIAGNOSIS — I5043 Acute on chronic combined systolic (congestive) and diastolic (congestive) heart failure: Secondary | ICD-10-CM | POA: Diagnosis present

## 2016-08-10 DIAGNOSIS — J9601 Acute respiratory failure with hypoxia: Secondary | ICD-10-CM | POA: Diagnosis present

## 2016-08-10 DIAGNOSIS — I482 Chronic atrial fibrillation, unspecified: Secondary | ICD-10-CM | POA: Diagnosis present

## 2016-08-10 DIAGNOSIS — D696 Thrombocytopenia, unspecified: Secondary | ICD-10-CM | POA: Diagnosis present

## 2016-08-10 DIAGNOSIS — Z79899 Other long term (current) drug therapy: Secondary | ICD-10-CM

## 2016-08-10 DIAGNOSIS — I890 Lymphedema, not elsewhere classified: Secondary | ICD-10-CM | POA: Diagnosis present

## 2016-08-10 DIAGNOSIS — I110 Hypertensive heart disease with heart failure: Secondary | ICD-10-CM | POA: Diagnosis present

## 2016-08-10 DIAGNOSIS — Z794 Long term (current) use of insulin: Secondary | ICD-10-CM

## 2016-08-10 DIAGNOSIS — IMO0002 Reserved for concepts with insufficient information to code with codable children: Secondary | ICD-10-CM | POA: Diagnosis present

## 2016-08-10 DIAGNOSIS — G2581 Restless legs syndrome: Secondary | ICD-10-CM | POA: Diagnosis present

## 2016-08-10 DIAGNOSIS — Z823 Family history of stroke: Secondary | ICD-10-CM

## 2016-08-10 LAB — INFLUENZA PANEL BY PCR (TYPE A & B)
Influenza A By PCR: NEGATIVE
Influenza B By PCR: NEGATIVE

## 2016-08-10 LAB — COMPREHENSIVE METABOLIC PANEL
ALBUMIN: 3.5 g/dL (ref 3.5–5.0)
ALT: 9 U/L — ABNORMAL LOW (ref 14–54)
ANION GAP: 10 (ref 5–15)
AST: 18 U/L (ref 15–41)
Alkaline Phosphatase: 50 U/L (ref 38–126)
BUN: 18 mg/dL (ref 6–20)
CHLORIDE: 99 mmol/L — AB (ref 101–111)
CO2: 31 mmol/L (ref 22–32)
Calcium: 9.5 mg/dL (ref 8.9–10.3)
Creatinine, Ser: 1.2 mg/dL — ABNORMAL HIGH (ref 0.44–1.00)
GFR calc non Af Amer: 39 mL/min — ABNORMAL LOW (ref >60)
GFR, EST AFRICAN AMERICAN: 45 mL/min — AB (ref >60)
GLUCOSE: 216 mg/dL — AB (ref 65–99)
Potassium: 4.2 mmol/L (ref 3.5–5.1)
SODIUM: 140 mmol/L (ref 135–145)
Total Bilirubin: 0.9 mg/dL (ref 0.3–1.2)
Total Protein: 6.8 g/dL (ref 6.5–8.1)

## 2016-08-10 LAB — PHOSPHORUS: Phosphorus: 2.2 mg/dL — ABNORMAL LOW (ref 2.5–4.6)

## 2016-08-10 LAB — I-STAT ARTERIAL BLOOD GAS, ED
Acid-Base Excess: 9 mmol/L — ABNORMAL HIGH (ref 0.0–2.0)
Acid-Base Excess: 9 mmol/L — ABNORMAL HIGH (ref 0.0–2.0)
BICARBONATE: 34.4 mmol/L — AB (ref 20.0–28.0)
Bicarbonate: 34.7 mmol/L — ABNORMAL HIGH (ref 20.0–28.0)
O2 SAT: 92 %
O2 Saturation: 99 %
PCO2 ART: 55.8 mmHg — AB (ref 32.0–48.0)
PH ART: 7.434 (ref 7.350–7.450)
PH ART: 7.403 (ref 7.350–7.450)
PO2 ART: 65 mmHg — AB (ref 83.0–108.0)
Patient temperature: 98.6
TCO2: 36 mmol/L (ref 0–100)
TCO2: 36 mmol/L (ref 0–100)
pCO2 arterial: 51.4 mmHg — ABNORMAL HIGH (ref 32.0–48.0)
pO2, Arterial: 125 mmHg — ABNORMAL HIGH (ref 83.0–108.0)

## 2016-08-10 LAB — CBC WITH DIFFERENTIAL/PLATELET
Basophils Absolute: 0 10*3/uL (ref 0.0–0.1)
Basophils Relative: 0 %
Eosinophils Absolute: 0 10*3/uL (ref 0.0–0.7)
Eosinophils Relative: 0 %
HEMATOCRIT: 39.2 % (ref 36.0–46.0)
HEMOGLOBIN: 12.1 g/dL (ref 12.0–15.0)
LYMPHS ABS: 0.8 10*3/uL (ref 0.7–4.0)
LYMPHS PCT: 14 %
MCH: 30.2 pg (ref 26.0–34.0)
MCHC: 30.9 g/dL (ref 30.0–36.0)
MCV: 97.8 fL (ref 78.0–100.0)
MONOS PCT: 3 %
Monocytes Absolute: 0.2 10*3/uL (ref 0.1–1.0)
NEUTROS PCT: 83 %
Neutro Abs: 4.7 10*3/uL (ref 1.7–7.7)
Platelets: 104 10*3/uL — ABNORMAL LOW (ref 150–400)
RBC: 4.01 MIL/uL (ref 3.87–5.11)
RDW: 14.3 % (ref 11.5–15.5)
WBC: 5.6 10*3/uL (ref 4.0–10.5)

## 2016-08-10 LAB — URINALYSIS, ROUTINE W REFLEX MICROSCOPIC
Bilirubin Urine: NEGATIVE
GLUCOSE, UA: NEGATIVE mg/dL
KETONES UR: NEGATIVE mg/dL
Leukocytes, UA: NEGATIVE
NITRITE: NEGATIVE
Protein, ur: 30 mg/dL — AB
Specific Gravity, Urine: 1.015 (ref 1.005–1.030)
pH: 5 (ref 5.0–8.0)

## 2016-08-10 LAB — PROTIME-INR
INR: 1.51
Prothrombin Time: 18.3 seconds — ABNORMAL HIGH (ref 11.4–15.2)

## 2016-08-10 LAB — BRAIN NATRIURETIC PEPTIDE: B Natriuretic Peptide: 186.5 pg/mL — ABNORMAL HIGH (ref 0.0–100.0)

## 2016-08-10 LAB — LACTIC ACID, PLASMA
LACTIC ACID, VENOUS: 3.4 mmol/L — AB (ref 0.5–1.9)
Lactic Acid, Venous: 2.5 mmol/L (ref 0.5–1.9)

## 2016-08-10 LAB — CBG MONITORING, ED
GLUCOSE-CAPILLARY: 148 mg/dL — AB (ref 65–99)
Glucose-Capillary: 192 mg/dL — ABNORMAL HIGH (ref 65–99)

## 2016-08-10 LAB — TROPONIN I: Troponin I: 0.03 ng/mL (ref <0.03)

## 2016-08-10 LAB — I-STAT CG4 LACTIC ACID, ED
LACTIC ACID, VENOUS: 3.54 mmol/L — AB (ref 0.5–1.9)
Lactic Acid, Venous: 3.12 mmol/L (ref 0.5–1.9)

## 2016-08-10 LAB — SEDIMENTATION RATE: Sed Rate: 25 mm/hr — ABNORMAL HIGH (ref 0–22)

## 2016-08-10 LAB — STREP PNEUMONIAE URINARY ANTIGEN: Strep Pneumo Urinary Antigen: NEGATIVE

## 2016-08-10 LAB — PROCALCITONIN: PROCALCITONIN: 0.63 ng/mL

## 2016-08-10 LAB — MAGNESIUM: Magnesium: 1.7 mg/dL (ref 1.7–2.4)

## 2016-08-10 LAB — APTT: APTT: 39 s — AB (ref 24–36)

## 2016-08-10 MED ORDER — VANCOMYCIN HCL 10 G IV SOLR: 1500.0000 mg | INTRAVENOUS | Status: DC

## 2016-08-10 MED ORDER — POTASSIUM CHLORIDE CRYS ER 20 MEQ PO TBCR
20.0000 meq | EXTENDED_RELEASE_TABLET | Freq: Every day | ORAL | Status: DC
  Administered 2016-08-11 – 2016-08-14 (×4): 20 meq via ORAL
  Filled 2016-08-10 (×4): qty 1

## 2016-08-10 MED ORDER — METRONIDAZOLE IN NACL 5-0.79 MG/ML-% IV SOLN
500.0000 mg | Freq: Once | INTRAVENOUS | Status: DC
  Filled 2016-08-10: qty 100

## 2016-08-10 MED ORDER — INSULIN ASPART 100 UNIT/ML ~~LOC~~ SOLN
0.0000 [IU] | Freq: Three times a day (TID) | SUBCUTANEOUS | Status: DC
Start: 2016-08-10 — End: 2016-08-14
  Administered 2016-08-11 – 2016-08-12 (×3): 3 [IU] via SUBCUTANEOUS
  Administered 2016-08-12: 8 [IU] via SUBCUTANEOUS
  Administered 2016-08-12: 5 [IU] via SUBCUTANEOUS
  Administered 2016-08-13 (×3): 8 [IU] via SUBCUTANEOUS
  Administered 2016-08-14: 3 [IU] via SUBCUTANEOUS
  Administered 2016-08-14: 2 [IU] via SUBCUTANEOUS

## 2016-08-10 MED ORDER — HYDROXYZINE HCL 25 MG PO TABS: 25.0000 mg | ORAL_TABLET | Freq: Three times a day (TID) | ORAL | Status: DC | PRN

## 2016-08-10 MED ORDER — VANCOMYCIN HCL IN DEXTROSE 1-5 GM/200ML-% IV SOLN
1000.0000 mg | INTRAVENOUS | Status: DC
  Administered 2016-08-11: 1000 mg via INTRAVENOUS
  Filled 2016-08-10 (×2): qty 200

## 2016-08-10 MED ORDER — DEXTROSE 5 % IV SOLN: 2.0000 g | Freq: Once | INTRAVENOUS | Status: DC

## 2016-08-10 MED ORDER — INSULIN ASPART 100 UNIT/ML ~~LOC~~ SOLN
0.0000 [IU] | Freq: Every day | SUBCUTANEOUS | Status: DC
  Administered 2016-08-12: 2 [IU] via SUBCUTANEOUS

## 2016-08-10 MED ORDER — VITAMIN D (ERGOCALCIFEROL) 1.25 MG (50000 UNIT) PO CAPS: 50000.0000 [IU] | ORAL_CAPSULE | ORAL | Status: DC

## 2016-08-10 MED ORDER — ONDANSETRON HCL 4 MG PO TABS: 4.0000 mg | ORAL_TABLET | Freq: Four times a day (QID) | ORAL | Status: DC | PRN

## 2016-08-10 MED ORDER — DOCUSATE SODIUM 100 MG PO CAPS: 100.0000 mg | ORAL_CAPSULE | Freq: Every day | ORAL | Status: DC | PRN

## 2016-08-10 MED ORDER — CALCIUM CARBONATE-VITAMIN D 500-200 MG-UNIT PO TABS
1.0000 | ORAL_TABLET | Freq: Two times a day (BID) | ORAL | Status: DC
  Administered 2016-08-10 – 2016-08-14 (×8): 1 via ORAL
  Filled 2016-08-10 (×8): qty 1

## 2016-08-10 MED ORDER — INSULIN GLARGINE 100 UNIT/ML ~~LOC~~ SOLN
30.0000 [IU] | Freq: Every day | SUBCUTANEOUS | Status: DC
  Administered 2016-08-10 – 2016-08-13 (×4): 30 [IU] via SUBCUTANEOUS
  Filled 2016-08-10 (×7): qty 0.3

## 2016-08-10 MED ORDER — FERROUS SULFATE 325 (65 FE) MG PO TABS
325.0000 mg | ORAL_TABLET | Freq: Every day | ORAL | Status: DC
  Administered 2016-08-11 – 2016-08-13 (×3): 325 mg via ORAL
  Filled 2016-08-10 (×3): qty 1

## 2016-08-10 MED ORDER — IPRATROPIUM-ALBUTEROL 0.5-2.5 (3) MG/3ML IN SOLN: 3.0000 mL | Freq: Four times a day (QID) | RESPIRATORY_TRACT | Status: DC | PRN

## 2016-08-10 MED ORDER — SODIUM CHLORIDE 0.9 % IV BOLUS (SEPSIS)
500.0000 mL | Freq: Once | INTRAVENOUS | Status: AC
  Administered 2016-08-10: 500 mL via INTRAVENOUS

## 2016-08-10 MED ORDER — AZTREONAM 2 G IJ SOLR
2.0000 g | Freq: Once | INTRAMUSCULAR | Status: AC
  Administered 2016-08-10: 2 g via INTRAVENOUS
  Filled 2016-08-10: qty 2

## 2016-08-10 MED ORDER — POLYETHYLENE GLYCOL 3350 17 G PO PACK: 17.0000 g | PACK | Freq: Every day | ORAL | Status: DC | PRN

## 2016-08-10 MED ORDER — LEVOFLOXACIN IN D5W 750 MG/150ML IV SOLN: 750.0000 mg | Freq: Once | INTRAVENOUS | Status: DC

## 2016-08-10 MED ORDER — ALBUTEROL SULFATE (2.5 MG/3ML) 0.083% IN NEBU: 2.5000 mg | INHALATION_SOLUTION | Freq: Four times a day (QID) | RESPIRATORY_TRACT | Status: DC | PRN

## 2016-08-10 MED ORDER — METRONIDAZOLE IN NACL 5-0.79 MG/ML-% IV SOLN: 500.0000 mg | Freq: Three times a day (TID) | INTRAVENOUS | Status: DC

## 2016-08-10 MED ORDER — METOPROLOL TARTRATE 12.5 MG HALF TABLET
12.5000 mg | ORAL_TABLET | Freq: Two times a day (BID) | ORAL | Status: DC
  Administered 2016-08-10 – 2016-08-14 (×8): 12.5 mg via ORAL
  Filled 2016-08-10 (×8): qty 1

## 2016-08-10 MED ORDER — APIXABAN 5 MG PO TABS
5.0000 mg | ORAL_TABLET | Freq: Two times a day (BID) | ORAL | Status: DC
  Administered 2016-08-10 – 2016-08-14 (×8): 5 mg via ORAL
  Filled 2016-08-10 (×8): qty 1

## 2016-08-10 MED ORDER — ROPINIROLE HCL 1 MG PO TABS
2.0000 mg | ORAL_TABLET | Freq: Three times a day (TID) | ORAL | Status: DC
  Administered 2016-08-10 – 2016-08-14 (×11): 2 mg via ORAL
  Filled 2016-08-10 (×13): qty 2

## 2016-08-10 MED ORDER — VANCOMYCIN HCL IN DEXTROSE 1-5 GM/200ML-% IV SOLN: 1000.0000 mg | Freq: Once | INTRAVENOUS | Status: DC

## 2016-08-10 MED ORDER — LEVETIRACETAM 500 MG PO TABS
500.0000 mg | ORAL_TABLET | Freq: Two times a day (BID) | ORAL | Status: DC
  Administered 2016-08-10 – 2016-08-14 (×8): 500 mg via ORAL
  Filled 2016-08-10 (×8): qty 1

## 2016-08-10 MED ORDER — ACETAMINOPHEN 650 MG RE SUPP
650.0000 mg | Freq: Once | RECTAL | Status: AC
  Administered 2016-08-10: 650 mg via RECTAL
  Filled 2016-08-10: qty 1

## 2016-08-10 MED ORDER — SODIUM CHLORIDE 0.9% FLUSH
3.0000 mL | Freq: Two times a day (BID) | INTRAVENOUS | Status: DC
  Administered 2016-08-10 – 2016-08-14 (×8): 3 mL via INTRAVENOUS

## 2016-08-10 MED ORDER — NAPHAZOLINE-PHENIRAMINE 0.025-0.3 % OP SOLN: 2.0000 [drp] | OPHTHALMIC | Status: DC | PRN

## 2016-08-10 MED ORDER — PANTOPRAZOLE SODIUM 40 MG PO TBEC
40.0000 mg | DELAYED_RELEASE_TABLET | Freq: Two times a day (BID) | ORAL | Status: DC
  Administered 2016-08-11 – 2016-08-14 (×7): 40 mg via ORAL
  Filled 2016-08-10 (×7): qty 1

## 2016-08-10 MED ORDER — ALBUTEROL SULFATE HFA 108 (90 BASE) MCG/ACT IN AERS: 1.0000 | INHALATION_SPRAY | Freq: Four times a day (QID) | RESPIRATORY_TRACT | Status: DC | PRN

## 2016-08-10 MED ORDER — DEXTROSE 5 % IV SOLN
1.0000 g | Freq: Three times a day (TID) | INTRAVENOUS | Status: DC
  Administered 2016-08-11 – 2016-08-12 (×6): 1 g via INTRAVENOUS
  Filled 2016-08-10 (×8): qty 1

## 2016-08-10 MED ORDER — VANCOMYCIN HCL 10 G IV SOLR
2000.0000 mg | Freq: Once | INTRAVENOUS | Status: AC
  Administered 2016-08-10: 2000 mg via INTRAVENOUS
  Filled 2016-08-10: qty 2000

## 2016-08-10 MED ORDER — SODIUM CHLORIDE 0.9 % IV SOLN
INTRAVENOUS | Status: DC
  Administered 2016-08-10: 14:00:00 via INTRAVENOUS

## 2016-08-10 MED ORDER — PREGABALIN 75 MG PO CAPS
75.0000 mg | ORAL_CAPSULE | Freq: Every day | ORAL | Status: DC
  Administered 2016-08-10 – 2016-08-13 (×4): 75 mg via ORAL
  Filled 2016-08-10 (×4): qty 1

## 2016-08-10 MED ORDER — ACETAMINOPHEN 325 MG PO TABS: 650.0000 mg | ORAL_TABLET | Freq: Four times a day (QID) | ORAL | Status: DC | PRN

## 2016-08-10 MED ORDER — DM-GUAIFENESIN ER 30-600 MG PO TB12
1.0000 | ORAL_TABLET | Freq: Two times a day (BID) | ORAL | Status: DC
  Administered 2016-08-10 – 2016-08-14 (×8): 1 via ORAL
  Filled 2016-08-10 (×8): qty 1

## 2016-08-10 MED ORDER — FUROSEMIDE 40 MG PO TABS
60.0000 mg | ORAL_TABLET | Freq: Two times a day (BID) | ORAL | Status: DC
  Administered 2016-08-11: 60 mg via ORAL
  Filled 2016-08-10: qty 1

## 2016-08-10 MED ORDER — ACETAMINOPHEN 650 MG RE SUPP: 650.0000 mg | Freq: Four times a day (QID) | RECTAL | Status: DC | PRN

## 2016-08-10 MED ORDER — ONDANSETRON HCL 4 MG/2ML IJ SOLN: 4.0000 mg | Freq: Four times a day (QID) | INTRAMUSCULAR | Status: DC | PRN

## 2016-08-10 NOTE — ED Notes (Signed)
IV Team at the bedside. Pt's IV positional

## 2016-08-10 NOTE — ED Notes (Signed)
X-Ray at the bedside 

## 2016-08-10 NOTE — ED Notes (Signed)
Pt's family reports that she is more sleepy than usual and her fingers are blue,where they were not before.  This RN consulted RT and upon discussion with her family, we learned that she is on Bi-Pap at home.  RT is placing patient on Bi-Pap now with her home settings in place.

## 2016-08-10 NOTE — ED Notes (Addendum)
Critical lactic acid of 2.5 called by lab.  MD paged to call this RN back.

## 2016-08-10 NOTE — ED Notes (Signed)
Attempted In and OUt x2 unable to obtain urine.

## 2016-08-10 NOTE — Progress Notes (Signed)
Patient transported from ED to 4N03 without any complications.

## 2016-08-10 NOTE — ED Notes (Signed)
Phlebotomy at the bedside getting cultures

## 2016-08-10 NOTE — ED Notes (Signed)
Ordered Meal Tray  

## 2016-08-10 NOTE — ED Notes (Signed)
RN Attempted Second IV placement

## 2016-08-10 NOTE — Progress Notes (Signed)
Md notified of pt's lactic acid 3.4. Saunders Revel T

## 2016-08-10 NOTE — ED Notes (Signed)
I stat lactic acid results given to Dr. Regenia Skeeter by Danne Harbor, EMT

## 2016-08-10 NOTE — ED Notes (Signed)
Pt is a hard stick. MD at the bedside attempting [place]. 3 attempt. RN attempted IV x2

## 2016-08-10 NOTE — ED Notes (Signed)
IV Team at the bedside attempting

## 2016-08-10 NOTE — ED Notes (Signed)
Admitting MD at the bedside.  

## 2016-08-10 NOTE — ED Triage Notes (Signed)
Pt BIB [facility] from home. Pt's home health nurse called EMS r/t [facility]. Nurse reported to EMS that although pt is bed bound she is typically alert in bed working puzzles or games. Home nurse also reported cyanosis of pt hands bilaterally which is new. Pt is on BiPAP at night, home health nurse left her on BiPAP until EMS arrived. Pt c/o trouble breathing. HX of COPD with recent antibiotic treatment for UTI and pneumonia X3 wk ago. EMS reports bilateral wheezing and grunting. EMS placed pt on non rebreather and gave 5 albuterol PTA.

## 2016-08-10 NOTE — ED Notes (Signed)
Paged MD Marily Memos about patient's GCS

## 2016-08-10 NOTE — ED Notes (Signed)
Spoke with MD about being unable to get urine. Second RN to attempt

## 2016-08-10 NOTE — H&P (Signed)
History and Physical    Elizabeth Pugh BDZ:[id number] DOB: 1926-06-08 DOA: 08/10/2016   PCP: Philis Fendt, MD   Patient coming from/Resides with: Private residence/daughter and sister  Admission status: Inpatient/SDU-medically necessary to stay a minimum 2 midnights to rule out impending and/or unexpected changes in physiologic status that may differ from initial evaluation performed in the ER and/or at time of admission. Patient presents with altered mental status in setting of fever, unexplained hypoxemia, and rapid progression of cellulitic changes left lower extremity. Urinalysis pending at time of admission; the patient has a history of recurrent UTI and is reporting dysuria. Patient requires nocturnal BiPAP and may require PRN utilization of BiPAP acutely. Recent hospitalization so will need to be covered for HCAP with broad-spectrum IV antibiotics, and she will for frequent nursing evaluation for telemetry monitoring, hemodynamic status, strict I/O and daily weights. We'll also require frequent serial labs and follow-up of blood cultures. Potential for acute DVT left lower extremity so will require venous duplex to rule out DVT.  Chief Complaint: Altered mental status  HPI: Elizabeth Pugh is a 83 y.o. female with medical history significant for morbid obesity, chronic combined systolic and diastolic heart failure, chronic hypoxemia on nasal cannula oxygen during the day and BiPAP nocturnally, seizure disorder on AED, hypertension, history of PE, diabetes on insulin, chronic atrial fibrillation on eliquis, chronic thrombocytopenia and history of CVA with progressive decline in function with patient now bedbound status. Patient was in her usual state of health until yesterday when family noted she was slightly less responsive than baseline. This morning patient was quite altered and not as communicative or jocular as is her baseline. At home she was found to have a fever over 101F. Home  health nurse also evaluated the patient and Pugh to her lack of responsiveness was concerned over acute hypercapnia so patient was placed back on BiPAP and EMS was called to the home. Upon EMS arrival to the home the patient demonstrated bilateral wheezing and grunting respiratory effort and she was subsequently placed on a nonrebreather mask and given 5 mg albuterol neb treatment. Upon arrival to the ER patient had an oral temperature of 102.9 F, tachypnea and tachycardia but was normotensive. Subsequently she has been given Tylenol and apparently has defervesced and has become more alert and has been weaned to 3 L nasal cannula oxygen which is her usual daytime requirement. ABG revealed stable hypercarbia with a normal pH but acute hypoxia with pO2 lower than baseline at 65. Lactic acid was elevated at greater than 3. BNP was slightly elevated at 1.86. Chest x-ray without focal infiltrate or edema. In addition to the above findings family also noted patient with bright red skin changes to left leg which are new as of this morning and have increased and extended up to the thigh level since arrival to the ER. Broad-spectrum antibiotics have been given in the ER for sepsis possibly of multifactorial etiology.  ED Course:  Vital Signs: BP (!) 138/55   Pulse 96   Temp (S) (!) 102.9 F (39.4 C) (Oral)   Resp (!) 25   Ht 5\' 6"  (1.676 m)   Wt 127 kg (280 lb)   SpO2 99%   BMI 45.19 kg/m  PCXR: No edema or consolidation or focal infiltrate Lab data: Na 140, K 4.2, Cl 99, CO2 31, BUN 18, Cr 1.20, AG 9, LFTs not elevated, BNP 186, TNI 0.03, Lactic acid 3.12, WBC 5600, neut 83%, abs neut 4.7%, Hgb 12.1,  platelets 104,000, blood cxs obtained in ER Medications and treatments: Tylenol suppository 650 mg PR 1, Azactam 2 g IV 1, vancomycin 2000 mg IV 1, normal saline bolus 500 mL 1  Review of Systems:  In addition to the HPI above,  No Headache, changes with Vision or hearing, new weakness, tingling, numbness  in any extremity, dizziness, dysarthria or word finding difficulty, gait disturbance or imbalance, tremors or seizure activity No problems swallowing food or Liquids, indigestion/reflux, choking or coughing while eating, abdominal pain with or after eating No Chest pain, Cough or Shortness of Breath, palpitations, orthopnea or DOE No Abdominal pain, N/V, melena,hematochezia, dark tarry stools, constipation No hematuria or flank pain No new skin lesions, masses or bruises, No new joint pains, aches, swelling or redness No recent unintentional weight loss No polyuria, polydypsia or polyphagia   Past Medical History:  Diagnosis Date  . Acute delirium 04/12/2011  . Acute kidney injury ([place]) 09/02/2015  . Anemia   . Anxiety   . Arthritis   . Atrial fibrillation ([place])   . Blood transfusion   . Bronchitis   . CHF (congestive heart failure) ([place])   . Diabetes mellitus   . GERD (gastroesophageal reflux disease)   . GI (gastrointestinal bleed)    Pugh to benign mass  . Hypertension   . Lobar pneumonia Pugh to unspecified organism 09/02/2015  . On home oxygen therapy    "4L; 24/7" (12/11/2015)  . Restless leg syndrome   . Seizures ([place])   . Stroke [facility])     Past Surgical History:  Procedure Laterality Date  . CATARACT EXTRACTION W/ INTRAOCULAR LENS IMPLANT     "Dr. Gershon Crane; think they did the right one"  . EUS  09/24/2011   Procedure: UPPER ENDOSCOPIC ULTRASOUND (EUS) LINEAR;  Surgeon: Beryle Beams, MD;  Location: [facility] ENDOSCOPY;  Service: Endoscopy;  Laterality: N/A;  . TONSILLECTOMY  1935  . TUBAL LIGATION      Social History   Social History  . Marital status: Widowed    Spouse name: N/A  . Number of children: N/A  . Years of education: N/A   Occupational History  . Not on file.   Social History Main Topics  . Smoking status: Former Smoker    Packs/day: 1.00    Years: 40.00    Types: Cigarettes    Quit date: 09/21/1985  . Smokeless tobacco: Never Used  . Alcohol use No  .  Drug use: No  . Sexual activity: No   Other Topics Concern  . Not on file   Social History Narrative  . No narrative on file    Mobility: Bed bound for 4 years Work history: Not obtained   Allergies  Allergen Reactions  . Erythromycin Other (See Comments)    Vaginal itching  . Penicillins Rash and Other (See Comments)    Has patient had a PCN reaction causing immediate rash, facial/tongue/throat swelling, SOB or lightheadedness with hypotension: No Has patient had a PCN reaction causing severe rash involving mucus membranes or skin necrosis: No Has patient had a PCN reaction that required hospitalization: No Has patient had a PCN reaction occurring within the last 10 years: No If all of the above answers are "NO", then may proceed with Cephalosporin use.  Marland Kitchen Zithromax [Azithromycin] Other (See Comments)    gave her a yeast infection.    Family History  Problem Relation Age of Onset  . Aneurysm Mother   . Heart attack Father   . Heart  failure Sister   . Asthma Sister   . Kidney failure Sister   . Stroke Sister      Prior to Admission medications   Medication Sig Start Date End Date Taking? Authorizing Provider  albuterol (PROVENTIL HFA;VENTOLIN HFA) 108 (90 BASE) MCG/ACT inhaler Inhale 1 puff into the lungs every 6 (six) hours as needed for wheezing or shortness of breath.   Yes Historical Provider, MD  apixaban (ELIQUIS) 5 MG TABS tablet Take 1 tablet (5 mg total) by mouth 2 (two) times daily. 09/27/15  Yes Lavina Hamman, MD  calcium-vitamin D (CALCIUM 500+D) 500-400 MG-UNIT per tablet Take 1 tablet by mouth 2 (two) times daily.   Yes Historical Provider, MD  dextromethorphan-guaiFENesin (MUCINEX DM) 30-600 MG 12hr tablet Take 1 tablet by mouth 2 (two) times daily. Take for 1 week   Yes Historical Provider, MD  Docusate Calcium (STOOL SOFTENER PO) Take 100 mg by mouth daily as needed (For constipation.). Colace   Yes Historical Provider, MD  ferrous sulfate 325 (65 FE) MG  tablet Take 325 mg by mouth daily with supper.    Yes Historical Provider, MD  furosemide (LASIX) 40 MG tablet Take 1.5 tablets (60 mg total) by mouth 2 (two) times daily. 07/21/16  Yes Verlee Monte, MD  hydrOXYzine (ATARAX/VISTARIL) 25 MG tablet Take 25 mg by mouth every 8 (eight) hours as needed for itching.  05/10/16  Yes Historical Provider, MD  insulin aspart (NOVOLOG) 100 UNIT/ML injection Inject 0-12 Units into the skin as needed for high blood sugar. 0-150 = 0 units, 151-200 = 2 units, 201-250 = 4 units, 251-300 = 6 units, 301-350 = 8 units, 351-400 = 10 units, 400+ = 12 units and notify NP/MD   Yes Historical Provider, MD  insulin glargine (LANTUS) 100 UNIT/ML injection Inject 0.2 mLs (20 Units total) into the skin at bedtime. Patient taking differently: Inject 30 Units into the skin at bedtime.  06/23/16  Yes Theodis Blaze, MD  ipratropium-albuterol (DUONEB) 0.5-2.5 (3) MG/3ML SOLN Take 3 mLs by nebulization. Every 6 hours while awake x5 days, then q6h prn for dyspnea and wheezing   Yes Historical Provider, MD  levETIRAcetam (KEPPRA) 500 MG tablet Take 1 tablet (500 mg total) by mouth 2 (two) times daily. 02/27/12  Yes Monika Salk, MD  metoprolol tartrate (LOPRESSOR) 25 MG tablet Take 0.5 tablets (12.5 mg total) by mouth 2 (two) times daily. 06/23/16  Yes Theodis Blaze, MD  Naphazoline-Pheniramine (ALLERGY EYE OP) Place 1 drop into both eyes as needed (For eye irritation.).   Yes Historical Provider, MD  pantoprazole (PROTONIX) 40 MG tablet Take 1 tablet (40 mg total) by mouth 2 (two) times daily before a meal. 10/01/15  Yes Lavina Hamman, MD  polyethylene glycol (MIRALAX / GLYCOLAX) packet Take 17 g by mouth daily as needed for mild constipation. 06/11/16  Yes Mariel Aloe, MD  potassium chloride SA (K-DUR,KLOR-CON) 20 MEQ tablet Take 1 tablet (20 mEq total) by mouth daily. 07/21/16  Yes Verlee Monte, MD  pregabalin (LYRICA) 75 MG capsule Take 75 mg by mouth at bedtime.    Yes Historical Provider,  MD  rOPINIRole (REQUIP) 2 MG tablet Take 1 tablet (2 mg total) by mouth 3 (three) times daily. 09/27/15  Yes Lavina Hamman, MD  Vitamin D, Ergocalciferol, (DRISDOL) 50000 units CAPS capsule Take 50,000 Units by mouth every Saturday. No specific day    Yes Historical Provider, MD  VOLTAREN 1 % GEL Apply 1 application  topically 4 (four) times daily as needed (For knee pain.).  10/26/11  Yes Historical Provider, MD    Physical Exam: Vitals:   08/10/16 1322 08/10/16 1330 08/10/16 1345 08/10/16 1400  BP: (!) 148/71 137/76 (!) 143/72 (!) 138/55  Pulse: (!) 120 (!) 118 (!) 110 96  Resp: (!) 24 (!) 26 (!) 26 (!) 25  Temp:      TempSrc:      SpO2: 100% 99% 100% 99%  Weight:      Height:          Constitutional: NAD, calm, comfortable Eyes: PERRL, lids and conjunctivae normal ENMT: Mucous membranes are Dry. Posterior pharynx clear of any exudate or lesions.Edentulous Neck: normal, supple, no masses, no thyromegaly Respiratory: clear to auscultation bilaterally, no wheezing, no crackles. Normal respiratory effort. No accessory muscle use.  Cardiovascular: Regular but tachycardic rate and rhythm, no murmurs / rubs / gallops. Focal trace to 1+ left lower extremity edema. 2+ pedal pulses. No carotid bruits.  Abdomen: no tenderness, no masses palpated. No hepatosplenomegaly. Bowel sounds positive.  Musculoskeletal: no clubbing / cyanosis. No joint deformity upper and lower extremities. Good ROM, no contractures. Normal muscle tone.  Skin: no rashes, lesions, ulcers. Marked cherry-red erythematous changes to the left lower extremity involving the foot extending all the way up the leg to the thigh and ends at the hip joint just below the abdomen in a circumferential pattern-the leg is not tender to palpation and there is no significant induration although there is mild trace to 1+ pitting edema Neurologic: CN 2-12 grossly intact. Sensation intact, DTR normal. Strength 4/5 x all 4 extremities.    Psychiatric: Awakens and oriented x name only. Normal mood. Keeps eyes closed and according to family is not at baseline. He does report she is hungry and states she would like some peanut butter.   Labs on Admission: I have personally reviewed following labs and imaging studies  CBC:  Recent Labs Lab 08/10/16 1227  WBC 5.6  NEUTROABS 4.7  HGB 12.1  HCT 39.2  MCV 97.8  PLT 409*   Basic Metabolic Panel:  Recent Labs Lab 08/10/16 1227  NA 140  K 4.2  CL 99*  CO2 31  GLUCOSE 216*  BUN 18  CREATININE 1.20*  CALCIUM 9.5   GFR: Estimated Creatinine Clearance: 44.2 mL/min (A) (by C-G formula based on SCr of 1.2 mg/dL (H)). Liver Function Tests:  Recent Labs Lab 08/10/16 1227  AST 18  ALT 9*  ALKPHOS 50  BILITOT 0.9  PROT 6.8  ALBUMIN 3.5   No results for input(s): LIPASE, AMYLASE in the last 168 hours. No results for input(s): AMMONIA in the last 168 hours. Coagulation Profile: No results for input(s): INR, PROTIME in the last 168 hours. Cardiac Enzymes:  Recent Labs Lab 08/10/16 1227  TROPONINI 0.03*   BNP (last 3 results) No results for input(s): PROBNP in the last 8760 hours. HbA1C: No results for input(s): HGBA1C in the last 72 hours. CBG: No results for input(s): GLUCAP in the last 168 hours. Lipid Profile: No results for input(s): CHOL, HDL, LDLCALC, TRIG, CHOLHDL, LDLDIRECT in the last 72 hours. Thyroid Function Tests: No results for input(s): TSH, T4TOTAL, FREET4, T3FREE, THYROIDAB in the last 72 hours. Anemia Panel: No results for input(s): VITAMINB12, FOLATE, FERRITIN, TIBC, IRON, RETICCTPCT in the last 72 hours. Urine analysis:    Component Value Date/Time   COLORURINE YELLOW 07/13/2016 [place] 07/13/2016 1745   LABSPEC 1.008 07/13/2016 1745  PHURINE 5.0 07/13/2016 1745   GLUCOSEU >=500 (A) 07/13/2016 1745   HGBUR SMALL (A) 07/13/2016 1745   BILIRUBINUR NEGATIVE 07/13/2016 1745   KETONESUR NEGATIVE 07/13/2016 1745    PROTEINUR NEGATIVE 07/13/2016 1745   UROBILINOGEN 1.0 01/19/2015 2241   NITRITE NEGATIVE 07/13/2016 1745   LEUKOCYTESUR TRACE (A) 07/13/2016 1745   Sepsis Labs: @LABRCNTIP (procalcitonin:4,lacticidven:4) )No results found for this or any previous visit (from the past 240 hour(s)).   Radiological Exams on Admission: Dg Chest Port 1 View  Result Date: 08/10/2016 CLINICAL DATA:  Tachycardia and lethargy.  Fever. EXAM: PORTABLE CHEST 1 VIEW COMPARISON:  July 13, 2016 FINDINGS: There is no edema or consolidation. Heart is enlarged with pulmonary vascularity within normal limits. No adenopathy. There is atherosclerotic calcification in the aorta. There is evidence of old trauma involving the left clavicle. IMPRESSION: Cardiomegaly. No edema or consolidation. There is aortic atherosclerosis. Electronically Signed   By: Lowella Grip III M.D.   On: 08/10/2016 12:33    EKG: (Independently reviewed) Atrial fibrillation with ventricular rate 123 bpm, QTC 412 ms, no acute ischemic changes   Assessment/Plan Principal Problem:   Sepsis  -Patient presents with altered mentation,  transient hypoxemia (worse than baseline of chronic hypoxia), tachycardia, fever, tachypnea, and elevated serum lactate-suspect multiple sources most obvious being left lower extremity cellulitis-urinalysis pending but reports dysuria-chest x-ray unremarkable but patient did present with hypoxemia -Treat underlying causes -Initiate broad-spectrum antibiotics: Azactam/vancomycin IV with pharmacy dosing -History of combined systolic and diastolic heart failure exacerbations with overly aggressive volume resuscitation; currently is hemodynamically stable from a blood pressure standpoint so we'll give gentle IV fluids at 75/hr and bolus only if blood pressure drops or if lactate increases on second reading -Cycle lactate; rpt is 3.54 so will give NS bolus 500 cc and cont to cycle lactate -Procalcitonin -PT/PTT -Follow up on  blood cultures  Active Problems:   Chronic combined systolic and diastolic heart failure  -At this juncture does not appear to have exacerbated heart failure but continue to monitor closely with treatment for sepsis/need for IVFs -Hold Lasix for now Pugh to need for gentle IV fluid hydration -BNP not significantly elevated for patient with combined systolic and diastolic heart failure -Unsure reliability of weights and Tamala Julian be weighed in the bed. Discharge weight on 3/20 was 267 pounds and today's weight 280 pounds -Daily weights -Strict I/O -Was not on ACE inhibitor or ARB prior to admission-suspect secondary to CKD    Acute on chronic respiratory failure with hypoxia and hypercapnia  -Patient chronically on low-dose nasal cannula oxygen as well as nocturnal BiPAP -Recent outpatient sleep study but results pending -ABG in the ER with mildly elevated CO2 slightly above 50 with a normal pH but PO2 low for this patient at 39 -Suspect may have early pneumonia process for follow-up on Procalcitonin -Influenza PCR -Follow up chest x-ray after hydration -Continue supportive care with nasal cannula oxygen during the day and BiPAP nocturnally although his mentation decreases may need to utilize BiPAP prn -Pending follow-up chest x-ray results, may require discontinuation of IV fluids and focus on diuresis if no evidence of pneumonia found    Cellulitis of left lower extremity -Acute onset beginning this morning or overnight with rapid progression since early this morning -Leg is not tender to touch so does not appear consistent with necrotizing soft tissue infection -Broad spectrum antibiotics as above -Bilateral LE venous duplex to rule out DVT although less likely since patient on chronic eliquis    DM (  diabetes mellitus), type 2, uncontrolled with complications  -Current CBG greater than 200 -Continue home Lantus -SSI -HgbA1c in February was 9.4    Hypertension -Currently controlled on  Toprol    Chronic atrial fibrillation  -Tachycardic in setting of sepsis, fever and acute infectious processes -Continue Toprol and eliquis -CHADVASc = 8    Seizure disorder  -Continue Keppra    History of pulmonary embolism -Continue eliquis    CVA, old, hemiparesis  -According to family, progressive decline in function since stroke and currently is bedbound    Thrombocytopenia  -Baseline platelets are between 107,000 and 142,000 -Current decrease in platelets could be harbinger of gram-negative infection    Morbid obesity with BMI of 45.0-49.9, adult  -Recent outpatient sleep study with results pending      DVT prophylaxis: Eliquis  Code Status: DO NOT RESUSCITATE Family Communication: Family at bedside  Disposition Plan: Home Consults called: None    ELLIS,ALLISON L. ANP-BC [facility] Pager [phone]   If 7PM-7AM, please contact night-coverage [email or web address] Password [email or web address]  08/10/2016, 2:26 PM

## 2016-08-10 NOTE — ED Notes (Signed)
Called [facility] to obtain ABG

## 2016-08-10 NOTE — Progress Notes (Addendum)
Pharmacy Antibiotic Note  Elizabeth Pugh is a 83 y.o. female admitted on 08/10/2016 with sepsis and cellulitis.  Pharmacy has been consulted for vancomycin, aztreonam. Tmax is 102.9 and WBC is WNL. Lactic acid is elevated at 3.12. Scr 1.2.   Plan: Vancomycin 2gm IV x 1 then 1gm IV Q24H Aztreonam 2gm IV x 1 then 1gm IV Q8H F/u renal fxn, C&S, clinical status and trough at SS  Height: 5\' 6"  (167.6 cm) Weight: 280 lb (127 kg) IBW/kg (Calculated) : 59.3  Temp (24hrs), Avg:102.9 F (39.4 C), Min:102.9 F (39.4 C), Max:102.9 F (39.4 C)  No results for input(s): WBC, CREATININE, LATICACIDVEN, VANCOTROUGH, VANCOPEAK, VANCORANDOM, GENTTROUGH, GENTPEAK, GENTRANDOM, TOBRATROUGH, TOBRAPEAK, TOBRARND, AMIKACINPEAK, AMIKACINTROU, AMIKACIN in the last 168 hours.  Estimated Creatinine Clearance: 47.8 mL/min (A) (by C-G formula based on SCr of 1.11 mg/dL (H)).    Allergies  Allergen Reactions  . Erythromycin Other (See Comments)    Vaginal itching  . Penicillins Rash and Other (See Comments)    Has patient had a PCN reaction causing immediate rash, facial/tongue/throat swelling, SOB or lightheadedness with hypotension: No Has patient had a PCN reaction causing severe rash involving mucus membranes or skin necrosis: No Has patient had a PCN reaction that required hospitalization: No Has patient had a PCN reaction occurring within the last 10 years: No If all of the above answers are "NO", then may proceed with Cephalosporin use.  Marland Kitchen Zithromax [Azithromycin] Other (See Comments)    gave her a yeast infection.    Antimicrobials this admission: Vanc 4/9>> Aztreo 4/9>>  Dose adjustments this admission: N/A  Microbiology results: Pending  Thank you for allowing pharmacy to be a part of this patient's care.  Danelia Snodgrass, Rande Lawman 08/10/2016 12:03 PM

## 2016-08-10 NOTE — ED Notes (Signed)
Xray at the bedside.

## 2016-08-10 NOTE — ED Notes (Signed)
CBG 192 

## 2016-08-10 NOTE — ED Provider Notes (Signed)
[facility] DEPT Provider Note   CSN: [id number] Arrival date & time: 08/10/16  1130   LEVEL 5 CAVEAT - ALTERED MENTAL STATUS  History   Chief Complaint Chief Complaint  Patient presents with  . Altered Mental Status  . Shortness of Breath    HPI Elizabeth Pugh is a 83 y.o. female.  HPI  83 year old female brought in by EMS for altered mental status. History is taken from the daughter at the bedside. Patient has been having progressive decreased mental status over the last 2 days. Found to be febrile by EMS, daughter did not know she had a fever. EMS gave her albuterol treatments due to shortness of breath. Daughter notes that the sats of very between 36 and 100%. Patient has been on and off her BiPAP. She is supposed to sleep with it at night. Home nurse noticed that her fingertips seemed blue and sent her into the ER. History is unobtainable from the patient and she is confused and altered. Daughter has noticed chronic leg swelling but now has swelling and redness in the left lower extremity. Gets cellulitis frequently. No specific complaints prior to the patient being sick. No increase or change in cough.  Past Medical History:  Diagnosis Date  . Acute delirium 04/12/2011  . Acute kidney injury ([place]) 09/02/2015  . Anemia   . Anxiety   . Arthritis   . Atrial fibrillation ([place])   . Blood transfusion   . Bronchitis   . CHF (congestive heart failure) ([place])   . Diabetes mellitus   . GERD (gastroesophageal reflux disease)   . GI (gastrointestinal bleed)    Due to benign mass  . Hypertension   . Lobar pneumonia due to unspecified organism 09/02/2015  . On home oxygen therapy    "4L; 24/7" (12/11/2015)  . Restless leg syndrome   . Seizures ([place])   . Stroke [facility])     Patient Active Problem List   Diagnosis Date Noted  . Sepsis ([place]) 08/10/2016  . Morbid obesity with BMI of 45.0-49.9, adult ([place]) 08/10/2016  . Cellulitis of left lower extremity 08/10/2016  . Acute respiratory  failure with hypercapnia ([place]) 07/13/2016  . Acute respiratory failure with hypoxia and hypercapnia ([place]) 06/16/2016  . Urinary tract infection without hematuria 06/16/2016  . HCAP (healthcare-associated pneumonia) 06/16/2016  . Acute respiratory failure ([place]) 06/16/2016  . CAP (community acquired pneumonia) 06/09/2016  . Cellulitis 12/16/2015  . Confusion 12/11/2015  . SIRS (systemic inflammatory response syndrome) ([place]) 12/11/2015  . Chronic respiratory failure with hypoxia and hypercapnia ([place]) 09/28/2015  . Acute encephalopathy   . Acute on chronic congestive heart failure ([place])   . Needs sleep apnea assessment   . Acute on chronic diastolic (congestive) heart failure 09/22/2015  . Acute kidney injury ([place]) 09/02/2015  . Lobar pneumonia due to unspecified organism 09/02/2015  . Bilateral leg edema   . Acute on chronic respiratory failure with hypoxia and hypercapnia ([place]) 08/29/2015  . History of pulmonary embolism 01/27/2015  . Chest pain at rest 01/27/2015  . Shortness of breath at rest 01/27/2015  . Chronic combined systolic and diastolic heart failure ([place]) 01/27/2015  . Thrombocytopenia ([place]) 01/27/2015  . Abnormal findings on radiological examination of gastrointestinal tract 01/27/2015  . Chronic atrial fibrillation ([place])   . Seizure disorder ([place])   . Anxiety   . Anemia   . Restless leg syndrome   . Acute delirium 04/12/2011  . DM (diabetes mellitus), type 2, uncontrolled with complications ([place]) 63/87/5643  .  CVA, old, hemiparesis ([place]) 03/25/2011  . Hypertension 03/25/2011    Past Surgical History:  Procedure Laterality Date  . CATARACT EXTRACTION W/ INTRAOCULAR LENS IMPLANT     "Dr. Gershon Crane; think they did the right one"  . EUS  09/24/2011   Procedure: UPPER ENDOSCOPIC ULTRASOUND (EUS) LINEAR;  Surgeon: Beryle Beams, MD;  Location: [facility] ENDOSCOPY;  Service: Endoscopy;  Laterality: N/A;  . TONSILLECTOMY  1935  . TUBAL LIGATION      OB History    No data  available       Home Medications    Prior to Admission medications   Medication Sig Start Date End Date Taking? Authorizing Provider  albuterol (PROVENTIL HFA;VENTOLIN HFA) 108 (90 BASE) MCG/ACT inhaler Inhale 1 puff into the lungs every 6 (six) hours as needed for wheezing or shortness of breath.   Yes Historical Provider, MD  apixaban (ELIQUIS) 5 MG TABS tablet Take 1 tablet (5 mg total) by mouth 2 (two) times daily. 09/27/15  Yes Lavina Hamman, MD  calcium-vitamin D (CALCIUM 500+D) 500-400 MG-UNIT per tablet Take 1 tablet by mouth 2 (two) times daily.   Yes Historical Provider, MD  dextromethorphan-guaiFENesin (MUCINEX DM) 30-600 MG 12hr tablet Take 1 tablet by mouth 2 (two) times daily. Take for 1 week   Yes Historical Provider, MD  Docusate Calcium (STOOL SOFTENER PO) Take 100 mg by mouth daily as needed (For constipation.). Colace   Yes Historical Provider, MD  ferrous sulfate 325 (65 FE) MG tablet Take 325 mg by mouth daily with supper.    Yes Historical Provider, MD  furosemide (LASIX) 40 MG tablet Take 1.5 tablets (60 mg total) by mouth 2 (two) times daily. 07/21/16  Yes Verlee Monte, MD  hydrOXYzine (ATARAX/VISTARIL) 25 MG tablet Take 25 mg by mouth every 8 (eight) hours as needed for itching.  05/10/16  Yes Historical Provider, MD  insulin aspart (NOVOLOG) 100 UNIT/ML injection Inject 0-12 Units into the skin as needed for high blood sugar. 0-150 = 0 units, 151-200 = 2 units, 201-250 = 4 units, 251-300 = 6 units, 301-350 = 8 units, 351-400 = 10 units, 400+ = 12 units and notify NP/MD   Yes Historical Provider, MD  insulin glargine (LANTUS) 100 UNIT/ML injection Inject 0.2 mLs (20 Units total) into the skin at bedtime. Patient taking differently: Inject 30 Units into the skin at bedtime.  06/23/16  Yes Theodis Blaze, MD  ipratropium-albuterol (DUONEB) 0.5-2.5 (3) MG/3ML SOLN Take 3 mLs by nebulization. Every 6 hours while awake x5 days, then q6h prn for dyspnea and wheezing   Yes Historical  Provider, MD  levETIRAcetam (KEPPRA) 500 MG tablet Take 1 tablet (500 mg total) by mouth 2 (two) times daily. 02/27/12  Yes Monika Salk, MD  metoprolol tartrate (LOPRESSOR) 25 MG tablet Take 0.5 tablets (12.5 mg total) by mouth 2 (two) times daily. 06/23/16  Yes Theodis Blaze, MD  Naphazoline-Pheniramine (ALLERGY EYE OP) Place 1 drop into both eyes as needed (For eye irritation.).   Yes Historical Provider, MD  pantoprazole (PROTONIX) 40 MG tablet Take 1 tablet (40 mg total) by mouth 2 (two) times daily before a meal. 10/01/15  Yes Lavina Hamman, MD  polyethylene glycol (MIRALAX / GLYCOLAX) packet Take 17 g by mouth daily as needed for mild constipation. 06/11/16  Yes Mariel Aloe, MD  potassium chloride SA (K-DUR,KLOR-CON) 20 MEQ tablet Take 1 tablet (20 mEq total) by mouth daily. 07/21/16  Yes Verlee Monte, MD  pregabalin (LYRICA) 75 MG capsule Take 75 mg by mouth at bedtime.    Yes Historical Provider, MD  rOPINIRole (REQUIP) 2 MG tablet Take 1 tablet (2 mg total) by mouth 3 (three) times daily. 09/27/15  Yes Lavina Hamman, MD  Vitamin D, Ergocalciferol, (DRISDOL) 50000 units CAPS capsule Take 50,000 Units by mouth every Saturday. No specific day    Yes Historical Provider, MD  VOLTAREN 1 % GEL Apply 1 application topically 4 (four) times daily as needed (For knee pain.).  10/26/11  Yes Historical Provider, MD    Family History Family History  Problem Relation Age of Onset  . Aneurysm Mother   . Heart attack Father   . Heart failure Sister   . Asthma Sister   . Kidney failure Sister   . Stroke Sister     Social History Social History  Substance Use Topics  . Smoking status: Former Smoker    Packs/day: 1.00    Years: 40.00    Types: Cigarettes    Quit date: 09/21/1985  . Smokeless tobacco: Never Used  . Alcohol use No     Allergies   Erythromycin; Penicillins; and Zithromax [azithromycin]   Review of Systems Review of Systems  Unable to perform ROS: Mental status change      Physical Exam Updated Vital Signs BP 131/75   Pulse (!) 104   Temp (!) 101.1 F (38.4 C) (Oral)   Resp (!) 26   Ht 5\' 6"  (1.676 m)   Wt 280 lb (127 kg)   SpO2 100%   BMI 45.19 kg/m   Physical Exam  Constitutional: She appears well-developed and well-nourished. She appears lethargic.  obese  HENT:  Head: Normocephalic and atraumatic.  Right Ear: External ear normal.  Left Ear: External ear normal.  Nose: Nose normal.  Eyes: Right eye exhibits no discharge. Left eye exhibits no discharge.  Cardiovascular: Normal heart sounds.  An irregular rhythm present. Tachycardia present.   Pulses:      Dorsalis pedis pulses are 2+ on the right side, and 2+ on the left side.  Pulmonary/Chest: Effort normal and breath sounds normal. She has no wheezes.  Abdominal: Soft. There is no tenderness.  Musculoskeletal:  BLE are large. LLE is swollen and hot (RLE is warm due to fever but LLE is hotter). No focal abscess. Erythema is diffuse  Neurological: She appears lethargic.  Lethargic but arouses to voice. un comprehensible response. Moves all 4 extremities but does not follow commands  Skin: Skin is warm and dry. There is erythema.  Nursing note and vitals reviewed.    ED Treatments / Results  Labs (all labs ordered are listed, but only abnormal results are displayed) Labs Reviewed  COMPREHENSIVE METABOLIC PANEL - Abnormal; Notable for the following:       Result Value   Chloride 99 (*)    Glucose, Bld 216 (*)    Creatinine, Ser 1.20 (*)    ALT 9 (*)    GFR calc non Af Amer 39 (*)    GFR calc Af Amer 45 (*)    All other components within normal limits  CBC WITH DIFFERENTIAL/PLATELET - Abnormal; Notable for the following:    Platelets 104 (*)    All other components within normal limits  BRAIN NATRIURETIC PEPTIDE - Abnormal; Notable for the following:    B Natriuretic Peptide 186.5 (*)    All other components within normal limits  TROPONIN I - Abnormal; Notable for the  following:  Troponin I 0.03 (*)    All other components within normal limits  I-STAT CG4 LACTIC ACID, ED - Abnormal; Notable for the following:    Lactic Acid, Venous 3.12 (*)    All other components within normal limits  I-STAT ARTERIAL BLOOD GAS, ED - Abnormal; Notable for the following:    pCO2 arterial 51.4 (*)    pO2, Arterial 65.0 (*)    Bicarbonate 34.4 (*)    Acid-Base Excess 9.0 (*)    All other components within normal limits  CULTURE, BLOOD (ROUTINE X 2)  CULTURE, BLOOD (ROUTINE X 2)  URINE CULTURE  CULTURE, EXPECTORATED SPUTUM-ASSESSMENT  GRAM STAIN  URINALYSIS, ROUTINE W REFLEX MICROSCOPIC  STREP PNEUMONIAE URINARY ANTIGEN  INFLUENZA PANEL BY PCR (TYPE A & B)  LEGIONELLA PNEUMOPHILA SEROGP 1 UR AG  APTT  PROCALCITONIN  PROTIME-INR  SEDIMENTATION RATE  I-STAT CG4 LACTIC ACID, ED    EKG  EKG Interpretation  Date/Time:  Monday August 10 2016 11:42:58 EDT Ventricular Rate:  123 PR Interval:    QRS Duration: 90 QT Interval:  288 QTC Calculation: 412 R Axis:   56 Text Interpretation:  Atrial fibrillation with rapid ventricular response with premature ventricular or aberrantly conducted complexes ST & T wave abnormality, consider inferior ischemia or digitalis effect Abnormal ECG Interpretation limited secondary to artifact Confirmed by Antony Sian MD, Saadiya Wilfong [phone]) on 08/10/2016 3:48:35 PM       Radiology Dg Chest Port 1 View  Result Date: 08/10/2016 CLINICAL DATA:  Tachycardia and lethargy.  Fever. EXAM: PORTABLE CHEST 1 VIEW COMPARISON:  July 13, 2016 FINDINGS: There is no edema or consolidation. Heart is enlarged with pulmonary vascularity within normal limits. No adenopathy. There is atherosclerotic calcification in the aorta. There is evidence of old trauma involving the left clavicle. IMPRESSION: Cardiomegaly. No edema or consolidation. There is aortic atherosclerosis. Electronically Signed   By: Lowella Grip III M.D.   On: 08/10/2016 12:33     Procedures Procedures (including critical care time)  Medications Ordered in ED Medications  vancomycin (VANCOCIN) 2,000 mg in sodium chloride 0.9 % 500 mL IVPB (2,000 mg Intravenous New Bag/Given 08/10/16 1402)  0.9 %  sodium chloride infusion ( Intravenous New Bag/Given 08/10/16 1402)  aztreonam (AZACTAM) 1 g in dextrose 5 % 50 mL IVPB (not administered)  insulin aspart (novoLOG) injection 0-15 Units (not administered)  insulin aspart (novoLOG) injection 0-5 Units (not administered)  vancomycin (VANCOCIN) IVPB 1000 mg/200 mL premix (not administered)  acetaminophen (TYLENOL) suppository 650 mg (650 mg Rectal Given 08/10/16 1404)  aztreonam (AZACTAM) 2 g in dextrose 5 % 50 mL IVPB (0 g Intravenous Stopped 08/10/16 1322)  sodium chloride 0.9 % bolus 500 mL (0 mLs Intravenous Stopped 08/10/16 1402)     Initial Impression / Assessment and Plan / ED Course  I have reviewed the triage vital signs and the nursing notes.  Pertinent labs & imaging results that were available during my care of the patient were reviewed by me and considered in my medical decision making (see chart for details).     Patient's mental status has improved with antibiotics and antipyretics. She is now talking more and awake more although she is not making much sense. Airway is stable. Pending this is most likely coming from her left lower extremity. No obvious abscess. Given broad antibiotics. Her lactate is 3, although with her it is difficult to tell how much dehydration there is versus CHF. She was given small boluses of fluids. Admit to the hospitalist.  Final Clinical Impressions(s) / ED Diagnoses   Final diagnoses:  Acute onset sepsis ([place])  Left leg cellulitis    New Prescriptions New Prescriptions   No medications on file     Sherwood Gambler, MD 08/10/16 1549

## 2016-08-11 ENCOUNTER — Encounter (HOSPITAL_COMMUNITY): Payer: 59

## 2016-08-11 ENCOUNTER — Inpatient Hospital Stay (HOSPITAL_COMMUNITY): Payer: Medicare Other

## 2016-08-11 DIAGNOSIS — J9601 Acute respiratory failure with hypoxia: Secondary | ICD-10-CM

## 2016-08-11 LAB — COMPREHENSIVE METABOLIC PANEL
ALK PHOS: 41 U/L (ref 38–126)
ALT: 9 U/L — ABNORMAL LOW (ref 14–54)
ANION GAP: 8 (ref 5–15)
AST: 21 U/L (ref 15–41)
Albumin: 2.9 g/dL — ABNORMAL LOW (ref 3.5–5.0)
BUN: 21 mg/dL — ABNORMAL HIGH (ref 6–20)
CALCIUM: 8.9 mg/dL (ref 8.9–10.3)
CO2: 32 mmol/L (ref 22–32)
Chloride: 104 mmol/L (ref 101–111)
Creatinine, Ser: 1.29 mg/dL — ABNORMAL HIGH (ref 0.44–1.00)
GFR calc non Af Amer: 36 mL/min — ABNORMAL LOW (ref >60)
GFR, EST AFRICAN AMERICAN: 42 mL/min — AB (ref >60)
Glucose, Bld: 144 mg/dL — ABNORMAL HIGH (ref 65–99)
Potassium: 4.2 mmol/L (ref 3.5–5.1)
SODIUM: 144 mmol/L (ref 135–145)
Total Bilirubin: 1.1 mg/dL (ref 0.3–1.2)
Total Protein: 5.8 g/dL — ABNORMAL LOW (ref 6.5–8.1)

## 2016-08-11 LAB — CBC
HCT: 36.7 % (ref 36.0–46.0)
HEMOGLOBIN: 10.9 g/dL — AB (ref 12.0–15.0)
MCH: 29.3 pg (ref 26.0–34.0)
MCHC: 29.7 g/dL — ABNORMAL LOW (ref 30.0–36.0)
MCV: 98.7 fL (ref 78.0–100.0)
Platelets: 90 10*3/uL — ABNORMAL LOW (ref 150–400)
RBC: 3.72 MIL/uL — AB (ref 3.87–5.11)
RDW: 14.6 % (ref 11.5–15.5)
WBC: 6.1 10*3/uL (ref 4.0–10.5)

## 2016-08-11 LAB — MRSA PCR SCREENING: MRSA BY PCR: NEGATIVE

## 2016-08-11 LAB — GLUCOSE, CAPILLARY
GLUCOSE-CAPILLARY: 104 mg/dL — AB (ref 65–99)
GLUCOSE-CAPILLARY: 189 mg/dL — AB (ref 65–99)
Glucose-Capillary: 158 mg/dL — ABNORMAL HIGH (ref 65–99)
Glucose-Capillary: 192 mg/dL — ABNORMAL HIGH (ref 65–99)

## 2016-08-11 LAB — URINE CULTURE: Culture: <10000 — AB

## 2016-08-11 LAB — LACTIC ACID, PLASMA: LACTIC ACID, VENOUS: 1.9 mmol/L (ref 0.5–1.9)

## 2016-08-11 MED ORDER — FUROSEMIDE 40 MG PO TABS
40.0000 mg | ORAL_TABLET | Freq: Two times a day (BID) | ORAL | Status: DC
  Administered 2016-08-11 – 2016-08-12 (×2): 40 mg via ORAL
  Filled 2016-08-11 (×2): qty 1

## 2016-08-11 NOTE — Procedures (Signed)
Patient Name: Elizabeth Pugh, Elizabeth Pugh Date: 08/05/2016 Gender: Female D.O.B: 08/20/1926 Age (years): 49 Referring Provider: Lynelle Smoke Parrett Height (inches): 66 Interpreting Physician: Kara Mead MD, ABSM Weight (lbs): 280 RPSGT: Laren Everts BMI: 45 MRN: [id number] Neck Size: 19.00   CLINICAL INFORMATION Sleep Study Type: NPSG  Indication for sleep study: Congestive Heart Failure, Diabetes, Excessive Daytime Sleepiness, Fatigue, Hypertension, Obesity, chronic respiratory failure on 2 L [place]  Epworth Sleepiness Score: 15     SLEEP STUDY TECHNIQUE As per the AASM Manual for the Scoring of Sleep and Associated Events v2.3 (April 2016) with a hypopnea requiring 4% desaturations.  The channels recorded and monitored were frontal, central and occipital EEG, electrooculogram (EOG), submentalis EMG (chin), nasal and oral airflow, thoracic and abdominal wall motion, anterior tibialis EMG, snore microphone, electrocardiogram, and pulse oximetry.  MEDICATIONS Medications self-administered by patient taken the night of the study : [place] The study was initiated at 10:41:28 PM and ended at 5:26:37 AM.  Sleep onset time was 25.6 minutes and the sleep efficiency was 81.9%. The total sleep time was 332.0 minutes.  Stage REM latency was 127.0 minutes.  The patient spent 6.17% of the night in stage N1 sleep, 78.46% in stage N2 sleep, 0.00% in stage N3 and 15.36% in REM.  Alpha intrusion was absent.  Supine sleep was 100.00%.  RESPIRATORY PARAMETERS This study was performed on 2L [place] The overall apnea/hypopnea index (AHI) was 0.4 per hour. There were 2 total apneas, including 2 obstructive, 0 central and 0 mixed apneas. There were 0 hypopneas and 13 RERAs.  The AHI during Stage REM sleep was 1.2 per hour.  AHI while supine was 0.4 per hour.  The mean oxygen saturation was 98.44%. The minimum SpO2 during sleep was 96.00%.  Soft snoring was noted during this  study.  CARDIAC DATA The 2 lead EKG demonstrated atrial fibrillation. The mean heart rate was 73.78 beats per minute. Other EKG findings include: PVCs.   LEG MOVEMENT DATA The total PLMS were 0 with a resulting PLMS index of 0.00. Associated arousal with leg movement index was 0.0 .  IMPRESSIONS - No significant obstructive sleep apnea occurred during this study (AHI = 0.4/h). - No significant central sleep apnea occurred during this study (CAI = 0.0/h). - The patient had minimal or no oxygen desaturation during the study on 2L [place] (Min O2 = 96.00%) - The patient snored with Soft snoring volume. - EKG findings include PVCs. - Clinically significant periodic limb movements did not occur during sleep. No significant associated arousals.   DIAGNOSIS - Chronic respiratory failure on 2L [place] No evidence of sleep disordered breathing   RECOMMENDATIONS -ct 2L O2 during sleep - Avoid alcohol, sedatives and other CNS depressants that may worsen sleep apnea and disrupt normal sleep architecture. - Sleep hygiene should be reviewed to assess factors that may improve sleep quality. - Weight management and regular exercise should be initiated or continued if appropriate.    Kara Mead MD Board Certified in [place]

## 2016-08-11 NOTE — Discharge Instructions (Signed)

## 2016-08-11 NOTE — Progress Notes (Signed)
Tried calling daughter on both cell and home phone. No answer so left her a message informing her that her mother has been transferred. Hal Neer RN BSN MSN. 08/11/2016 @15 .30

## 2016-08-11 NOTE — Progress Notes (Signed)
BIPAP box brought up to patient and setup. Patient was not ready to go on at this time. RT will check back later.

## 2016-08-11 NOTE — Progress Notes (Signed)
PROGRESS NOTE    Elizabeth Pugh  HYI:[id number] DOB: 1927-01-05 DOA: 08/10/2016 PCP: Philis Fendt, MD  Brief Narrative:Elizabeth Pugh is a 83 y.o. female with medical history significant for morbid obesity, chronic combined systolic and diastolic heart failure, chronic hypoxemia on nasal cannula oxygen during the day and BiPAP nocturnally, seizure disorder on AED, hypertension, history of PE, diabetes on insulin, chronic atrial fibrillation on eliquis, chronic thrombocytopenia and history of CVA with progressive decline in function with patient now bedbound status. Patient was in her usual state of health until yesterday when family noted she was slightly less responsive than baseline.  At home she was found to have a fever over 101F. Home health nurse also evaluated the patient and due to her lack of responsiveness was concerned, EMS was called to the home.  In ED temperature of 102.9 F, tachypnea and tachycardia but was normotensive. Lactic acid >3 BNP was slightly elevated at 1.86. Chest x-ray without focal infiltrate or edema. In addition to the above findings, pt noted to have bright red skin changes to left leg which are new as of this morning and have increased and extended up to the thigh level    Assessment & Plan:  Sepsis  -due to cellulitis of LLE -CXR and UA unimpressive -continue  Azactam/vancomycin IV with pharmacy dosing -FU on blood CX -will stop IVF -resume PO lasix at lower dose -transfer to tele    Chronic combined systolic and diastolic heart failure  -mildly overloaded in setting of sepsis -will stop IVF and resume po lasix at 40mg  BID -monitor weights, I/Os    Acute on chronic respiratory failure with hypoxia and hypercapnia  -Patient chronically on low-dose nasal cannula oxygen as well as nocturnal BiPAP -Recent outpatient sleep study but results pending -ABG in the ER with mildly elevated CO2 slightly above 50 with a normal pH but PO2 low for this patient  at 65 -stable at baseline chronic resp failure now, continue BIPAP QHS and 4L Home O2 at daytime -now bed bound and ongoing decline, frequent re-admissions, will d/w daughter regarding Palliative care meeting -Tx to tele    DM (diabetes mellitus), type 2, uncontrolled with complications  -Continue home Lantus, SSI -HgbA1c in February was 9.4    Hypertension -Currently controlled on Toprol    Chronic atrial fibrillation  -Tachycardic in setting of sepsis, fever and acute infectious processes -Continue Toprol and eliquis -CHADVASc = 8    Seizure disorder  -Continue Keppra    History of pulmonary embolism -Continue eliquis    CVA, old, hemiparesis  -According to family, progressive decline in function since stroke and currently is bedbound    Thrombocytopenia  -Baseline platelets are between 107,000 and 142,000 -Current decrease in platelets could be harbinger of gram-negative infection    Morbid obesity with BMI of 45.0-49.9, adult  -Recent outpatient sleep study with results pending    DVT prophylaxis: Eliquis  Code Status: DO NOT RESUSCITATE Family Communication: none at bedside  Disposition Plan: transfer to tele, may need SNF   Consultants:    Antimicrobials:   Vanc/Aztreonam 4/9   Subjective: Feels ok, some pain in L leg, no dyspnea now  Objective: Vitals:   08/11/16 0402 08/11/16 0500 08/11/16 0706 08/11/16 0707  BP: 126/90 109/68 110/63   Pulse: 80 80 91 75  Resp: 18 (!) 22 18 20   Temp: 99.8 F (37.7 C)   99.2 F (37.3 C)  TempSrc: Oral   Axillary  SpO2: 100% 100% 100%  97%  Weight:      Height:        Intake/Output Summary (Last 24 hours) at 08/11/16 1028 Last data filed at 08/11/16 0900  Gross per 24 hour  Intake          3433.75 ml  Output                1 ml  Net          3432.75 ml   Filed Weights   08/10/16 1135 08/10/16 2310 08/11/16 0325  Weight: 127 kg (280 lb) 131 kg (288 lb 12.8 oz) 131.2 kg (289 lb 4.8 oz)     Examination:  General exam: morbidly obese, chronically ill female, sitting in chair, no distress Respiratory system: poor air movement Cardiovascular system: S1 & S2 heard, RRR. No JVD, murmurs Gastrointestinal system: Abdomen is nondistended, soft and nontender. Normal bowel sounds heard. Central nervous system: Alert and oriented to self only Extremities: LLE with erythema, swelling, tenderness, tracking to upper thigh Skin: No rashes, lesions or ulcers Psychiatry: Judgement and insight appear normal. Mood & affect appropriate.     Data Reviewed:   CBC:  Recent Labs Lab 08/10/16 1227 08/11/16 0203  WBC 5.6 6.1  NEUTROABS 4.7  --   HGB 12.1 10.9*  HCT 39.2 36.7  MCV 97.8 98.7  PLT 104* 90*   Basic Metabolic Panel:  Recent Labs Lab 08/10/16 1227 08/10/16 1758 08/11/16 0203  NA 140  --  144  K 4.2  --  4.2  CL 99*  --  104  CO2 31  --  32  GLUCOSE 216*  --  144*  BUN 18  --  21*  CREATININE 1.20*  --  1.29*  CALCIUM 9.5  --  8.9  MG  --  1.7  --   PHOS  --  2.2*  --    GFR: Estimated Creatinine Clearance: 41.9 mL/min (A) (by C-G formula based on SCr of 1.29 mg/dL (H)). Liver Function Tests:  Recent Labs Lab 08/10/16 1227 08/11/16 0203  AST 18 21  ALT 9* 9*  ALKPHOS 50 41  BILITOT 0.9 1.1  PROT 6.8 5.8*  ALBUMIN 3.5 2.9*   No results for input(s): LIPASE, AMYLASE in the last 168 hours. No results for input(s): AMMONIA in the last 168 hours. Coagulation Profile:  Recent Labs Lab 08/10/16 1624  INR 1.51   Cardiac Enzymes:  Recent Labs Lab 08/10/16 1227  TROPONINI 0.03*   BNP (last 3 results) No results for input(s): PROBNP in the last 8760 hours. HbA1C: No results for input(s): HGBA1C in the last 72 hours. CBG:  Recent Labs Lab 08/10/16 1621 08/10/16 2217 08/11/16 0747  GLUCAP 192* 148* 104*   Lipid Profile: No results for input(s): CHOL, HDL, LDLCALC, TRIG, CHOLHDL, LDLDIRECT in the last 72 hours. Thyroid Function  Tests: No results for input(s): TSH, T4TOTAL, FREET4, T3FREE, THYROIDAB in the last 72 hours. Anemia Panel: No results for input(s): VITAMINB12, FOLATE, FERRITIN, TIBC, IRON, RETICCTPCT in the last 72 hours. Urine analysis:    Component Value Date/Time   COLORURINE YELLOW 08/10/2016 1539   APPEARANCEUR CLOUDY (A) 08/10/2016 1539   LABSPEC 1.015 08/10/2016 1539   PHURINE 5.0 08/10/2016 1539   GLUCOSEU NEGATIVE 08/10/2016 1539   HGBUR MODERATE (A) 08/10/2016 1539   BILIRUBINUR NEGATIVE 08/10/2016 1539   KETONESUR NEGATIVE 08/10/2016 1539   PROTEINUR 30 (A) 08/10/2016 1539   UROBILINOGEN 1.0 01/19/2015 2241   NITRITE NEGATIVE 08/10/2016 1539   LEUKOCYTESUR  NEGATIVE 08/10/2016 1539   Sepsis Labs: @LABRCNTIP (procalcitonin:4,lacticidven:4)  ) Recent Results (from the past 240 hour(s))  Blood Culture (routine x 2)     Status: None (Preliminary result)   Collection Time: 08/10/16 12:25 PM  Result Value Ref Range Status   Specimen Description BLOOD RIGHT HAND  Final   Special Requests   Final    BOTTLES DRAWN AEROBIC AND ANAEROBIC Blood Culture results may not be optimal due to an inadequate volume of blood received in culture bottles   Culture NO GROWTH < 24 HOURS  Final   Report Status PENDING  Incomplete  MRSA PCR Screening     Status: None   Collection Time: 08/10/16 11:01 PM  Result Value Ref Range Status   MRSA by PCR NEGATIVE NEGATIVE Final    Comment:        The GeneXpert MRSA Assay (FDA approved for NASAL specimens only), is one component of a comprehensive MRSA colonization surveillance program. It is not intended to diagnose MRSA infection nor to guide or monitor treatment for MRSA infections.          Radiology Studies: Portable Chest 1 View  Result Date: 08/11/2016 CLINICAL DATA:  Acute respiratory failure with hypoxia EXAM: PORTABLE CHEST 1 VIEW COMPARISON:  08/10/2016 FINDINGS: Cardiomegaly with vascular congestion. Low lung volumes. Small right pleural  effusion again noted. No confluent opacities or edema. IMPRESSION: Cardiomegaly with vascular congestion.  Stable small right effusion. Electronically Signed   By: Rolm Baptise M.D.   On: 08/11/2016 07:18   Dg Chest Portable 1 View  Result Date: 08/10/2016 CLINICAL DATA:  Shortness of breath and cellulitis EXAM: PORTABLE CHEST 1 VIEW COMPARISON:  08/10/2016 FINDINGS: Cardiac shadow remains enlarged. The lungs are well aerated bilaterally. Some blunting of the right costophrenic angle consistent with small effusion on the right is noted better visualize from the prior exam. No new focal infiltrate is seen. IMPRESSION: Small right effusion.  No other new focal abnormality is seen. Electronically Signed   By: Inez Catalina M.D.   On: 08/10/2016 19:22   Dg Chest Port 1 View  Result Date: 08/10/2016 CLINICAL DATA:  Tachycardia and lethargy.  Fever. EXAM: PORTABLE CHEST 1 VIEW COMPARISON:  July 13, 2016 FINDINGS: There is no edema or consolidation. Heart is enlarged with pulmonary vascularity within normal limits. No adenopathy. There is atherosclerotic calcification in the aorta. There is evidence of old trauma involving the left clavicle. IMPRESSION: Cardiomegaly. No edema or consolidation. There is aortic atherosclerosis. Electronically Signed   By: Lowella Grip III M.D.   On: 08/10/2016 12:33        Scheduled Meds: . apixaban  5 mg Oral BID  . aztreonam  1 g Intravenous Q8H  . calcium-vitamin D  1 tablet Oral BID  . dextromethorphan-guaiFENesin  1 tablet Oral BID  . ferrous sulfate  325 mg Oral Q supper  . furosemide  40 mg Oral BID  . insulin aspart  0-15 Units Subcutaneous TID WC  . insulin aspart  0-5 Units Subcutaneous QHS  . insulin glargine  30 Units Subcutaneous QHS  . levETIRAcetam  500 mg Oral BID  . metoprolol tartrate  12.5 mg Oral BID  . pantoprazole  40 mg Oral BID AC  . potassium chloride SA  20 mEq Oral Daily  . pregabalin  75 mg Oral QHS  . rOPINIRole  2 mg Oral TID  .  sodium chloride flush  3 mL Intravenous Q12H  . vancomycin  1,000 mg Intravenous Q24H  . [  START ON 08/15/2016] Vitamin D (Ergocalciferol)  50,000 Units Oral Q Sat   Continuous Infusions:   LOS: 1 day    Time spent: 52min    Domenic Polite, MD [facility] Pager [phone]  If 7PM-7AM, please contact night-coverage [email or web address] Password [email or web address] 08/11/2016, 10:28 AM

## 2016-08-12 ENCOUNTER — Inpatient Hospital Stay (HOSPITAL_COMMUNITY): Payer: Medicare Other

## 2016-08-12 DIAGNOSIS — A419 Sepsis, unspecified organism: Principal | ICD-10-CM

## 2016-08-12 DIAGNOSIS — M79609 Pain in unspecified limb: Secondary | ICD-10-CM

## 2016-08-12 LAB — PROCALCITONIN: Procalcitonin: 0.84 ng/mL

## 2016-08-12 LAB — GLUCOSE, CAPILLARY
Glucose-Capillary: 193 mg/dL — ABNORMAL HIGH (ref 65–99)
Glucose-Capillary: 292 mg/dL — ABNORMAL HIGH (ref 65–99)
Glucose-Capillary: 241 mg/dL — ABNORMAL HIGH (ref 65–99)
Glucose-Capillary: 241 mg/dL — ABNORMAL HIGH (ref 65–99)

## 2016-08-12 LAB — CBC
HCT: 33 % — ABNORMAL LOW (ref 36.0–46.0)
Hemoglobin: 9.9 g/dL — ABNORMAL LOW (ref 12.0–15.0)
MCH: 29.7 pg (ref 26.0–34.0)
MCHC: 30 g/dL (ref 30.0–36.0)
MCV: 99.1 fL (ref 78.0–100.0)
PLATELETS: 89 10*3/uL — AB (ref 150–400)
RBC: 3.33 MIL/uL — AB (ref 3.87–5.11)
RDW: 14.7 % (ref 11.5–15.5)
WBC: 4.4 10*3/uL (ref 4.0–10.5)

## 2016-08-12 LAB — BASIC METABOLIC PANEL
Anion gap: 8 (ref 5–15)
BUN: 24 mg/dL — ABNORMAL HIGH (ref 6–20)
CO2: 28 mmol/L (ref 22–32)
Calcium: 8.6 mg/dL — ABNORMAL LOW (ref 8.9–10.3)
Chloride: 104 mmol/L (ref 101–111)
Creatinine, Ser: 1.22 mg/dL — ABNORMAL HIGH (ref 0.44–1.00)
GFR calc Af Amer: 44 mL/min — ABNORMAL LOW (ref >60)
GFR calc non Af Amer: 38 mL/min — ABNORMAL LOW (ref >60)
Glucose, Bld: 213 mg/dL — ABNORMAL HIGH (ref 65–99)
Potassium: 4.1 mmol/L (ref 3.5–5.1)
Sodium: 140 mmol/L (ref 135–145)

## 2016-08-12 LAB — LEGIONELLA PNEUMOPHILA SEROGP 1 UR AG: L. PNEUMOPHILA SEROGP 1 UR AG: NEGATIVE

## 2016-08-12 MED ORDER — FUROSEMIDE 10 MG/ML IJ SOLN
40.0000 mg | Freq: Every day | INTRAMUSCULAR | Status: DC
  Administered 2016-08-12 – 2016-08-13 (×2): 40 mg via INTRAVENOUS
  Filled 2016-08-12 (×2): qty 4

## 2016-08-12 MED ORDER — DOXYCYCLINE HYCLATE 100 MG IV SOLR
100.0000 mg | Freq: Two times a day (BID) | INTRAVENOUS | Status: DC
  Administered 2016-08-12 – 2016-08-14 (×4): 100 mg via INTRAVENOUS
  Filled 2016-08-12 (×5): qty 100

## 2016-08-12 MED ORDER — SACCHAROMYCES BOULARDII 250 MG PO CAPS
250.0000 mg | ORAL_CAPSULE | Freq: Two times a day (BID) | ORAL | Status: DC
  Administered 2016-08-12 – 2016-08-14 (×5): 250 mg via ORAL
  Filled 2016-08-12 (×5): qty 1

## 2016-08-12 MED ORDER — ORAL CARE MOUTH RINSE
15.0000 mL | Freq: Two times a day (BID) | OROMUCOSAL | Status: DC
  Administered 2016-08-12 – 2016-08-14 (×3): 15 mL via OROMUCOSAL

## 2016-08-12 NOTE — Progress Notes (Signed)
Patient could not tolerate previous BIPAP settings, stated it was too much. Helped adjust settings with patient and she is resting comfortably on CPAP with a settings on 4 and 4 lpm oxygen bled in.

## 2016-08-12 NOTE — Progress Notes (Signed)
Patient with no complaints or concerns during 7pm - 7am shift. Slept during the night.   Batya Citron, RN 

## 2016-08-12 NOTE — Progress Notes (Addendum)
*  PRELIMINARY RESULTS* Vascular Ultrasound Left lower extremity venous duplex has been completed.  Preliminary findings: Limited exam due to limitations of penetration. Visualized veins of the left lower extremity appear negative for  thrombosis.   Everrett Coombe 08/12/2016, 10:17 AM

## 2016-08-12 NOTE — Progress Notes (Signed)
[facility] Progress Note  Patient: Elizabeth Pugh SNK:[id number]   PCP: Philis Fendt, MD DOB: January 22, 1927   DOA: 08/10/2016   DOS: 08/12/2016   Date of Service: the patient was seen and examined on 08/12/2016  Subjective: Feeling better, continues to have some redness of the left leg, no nausea no vomiting.  Brief hospital course: Pt. with PMH of DM, CVA, HTN, A. fib, seizure, CHF, morbid obesity; admitted on 08/10/2016, with complaint of confusion, was found to have sepsis likely from cellulitis. Currently further plan is continue to monitor after change of the antibiotics.  Assessment and Plan: 1. Sepsis ([place]) from cellulitis. Blood cultures currently negative. MRSA PCR negative. Urine antigens are negative as well. Patient was initially given IV antibiotics with vancomycin and meniscectomy as well as IV fluids. Currently fluids are stopped. We'll change antibiotics to doxycycline. Monitor. We'll finish a 7 day treatment course. Likely has cellulitis is due to chronic venous stasis as well as lymphedema and patient will remain at high risk for reoccurrence of infection. This was informed to the family.  2. Acute on chronic combined CHF. Mildly overloaded after receiving IV fluids. Initially IV fluids were stopped and by mouth Lasix was resumed. I will give the patient IV Lasix for today and monitor. Daily weights and ins and outs.  3. Acute on chronic respiratory failure with hypoxia and hypercapnia. Chronically on oxygen during the day as well as on nocturnal BiPAP. ABG in the ER showed mild worsening of her CO2 Currently on home oxygen during the day. Tolerating BiPAP every night. We'll monitor.  4. Type 2 diabetes mellitus. Uncontrolled. Hyperglycemia. Hemoglobin A1c in February was 9.4. Continue home Lantus and sliding scale insulin at present.  5. Chronic A. fib. History of PE. History of CVA.  morbid obesity with limited mobility.  Continue Toprol and  eliquis CHADVASc = 8 Lower extremity Doppler negative for any DVT.  6. Seizure disorder. Continue Keppra.  7. Thrombocytopenia. Acute on chronic, likely in the setting of sepsis. We'll monitor.  Bowel regimen: last BM 08/11/2016 Diet: cardaic diet DVT Prophylaxis: Eliquis  Advance goals of care discussion: DNR/DNI  Family Communication: family was present at bedside, at the time of interview. The pt provided permission to discuss medical plan with the family. Opportunity was given to ask question and all questions were answered satisfactorily.   Disposition:  Discharge to SNF. Expected discharge date: 08/13/2016, stabilization of cellulitis and sepsis physiology.  Consultants: none Procedures: none  Antibiotics: Anti-infectives    Start     Dose/Rate Route Frequency Ordered Stop   08/12/16 1600  doxycycline (VIBRAMYCIN) 100 mg in dextrose 5 % 250 mL IVPB     100 mg 125 mL/hr over 120 Minutes Intravenous Every 12 hours 08/12/16 1525     08/11/16 1500  vancomycin (VANCOCIN) 1,500 mg in sodium chloride 0.9 % 500 mL IVPB  Status:  Discontinued     1,500 mg 250 mL/hr over 120 Minutes Intravenous Every 24 hours 08/10/16 1350 08/10/16 1355   08/11/16 1500  vancomycin (VANCOCIN) IVPB 1000 mg/200 mL premix  Status:  Discontinued     1,000 mg 200 mL/hr over 60 Minutes Intravenous Every 24 hours 08/10/16 1355 08/12/16 1040   08/10/16 2200  metroNIDAZOLE (FLAGYL) IVPB 500 mg  Status:  Discontinued     500 mg 100 mL/hr over 60 Minutes Intravenous Every 8 hours 08/10/16 1303 08/10/16 1347   08/10/16 2200  aztreonam (AZACTAM) 1 g in dextrose 5 % 50 mL IVPB  Status:  Discontinued     1 g 100 mL/hr over 30 Minutes Intravenous Every 8 hours 08/10/16 1350 08/12/16 1525   08/10/16 1345  levofloxacin (LEVAQUIN) IVPB 750 mg  Status:  Discontinued     750 mg 100 mL/hr over 90 Minutes Intravenous  Once 08/10/16 1337 08/10/16 1347   08/10/16 1345  aztreonam (AZACTAM) 2 g in dextrose 5 % 50 mL  IVPB  Status:  Discontinued     2 g 100 mL/hr over 30 Minutes Intravenous  Once 08/10/16 1337 08/10/16 1344   08/10/16 1345  vancomycin (VANCOCIN) IVPB 1000 mg/200 mL premix  Status:  Discontinued     1,000 mg 200 mL/hr over 60 Minutes Intravenous  Once 08/10/16 1337 08/10/16 1345   08/10/16 1200  aztreonam (AZACTAM) 2 g in dextrose 5 % 50 mL IVPB     2 g 100 mL/hr over 30 Minutes Intravenous  Once 08/10/16 1155 08/10/16 1322   08/10/16 1200  metroNIDAZOLE (FLAGYL) IVPB 500 mg  Status:  Discontinued     500 mg 100 mL/hr over 60 Minutes Intravenous  Once 08/10/16 1155 08/10/16 1347   08/10/16 1200  vancomycin (VANCOCIN) IVPB 1000 mg/200 mL premix  Status:  Discontinued     1,000 mg 200 mL/hr over 60 Minutes Intravenous  Once 08/10/16 1155 08/10/16 1157   08/10/16 1200  vancomycin (VANCOCIN) 2,000 mg in sodium chloride 0.9 % 500 mL IVPB     2,000 mg 250 mL/hr over 120 Minutes Intravenous  Once 08/10/16 1157 08/10/16 1602       Objective: Physical Exam: Vitals:   08/12/16 0115 08/12/16 0657 08/12/16 0658 08/12/16 1230  BP: 127/80  123/68 129/63  Pulse: (!) 116  89 65  Resp: 20  20 18   Temp: 98 F (36.7 C)  97.6 F (36.4 C) 97.3 F (36.3 C)  TempSrc: Oral  Oral Oral  SpO2: 98%  100% 100%  Weight:  131.6 kg (290 lb 3.2 oz)    Height:        Intake/Output Summary (Last 24 hours) at 08/12/16 1715 Last data filed at 08/12/16 1402  Gross per 24 hour  Intake             1060 ml  Output                0 ml  Net             1060 ml   Filed Weights   08/10/16 2310 08/11/16 0325 08/12/16 0657  Weight: 131 kg (288 lb 12.8 oz) 131.2 kg (289 lb 4.8 oz) 131.6 kg (290 lb 3.2 oz)   General: Alert, Awake and Oriented to Time, Place and Person. Appear in mild distress, affect appropriate Eyes: PERRL, Conjunctiva normal ENT: Oral Mucosa clear moist. Neck: difficult to assess JVD, no Abnormal Mass Or lumps Cardiovascular: S1 and S2 Present, aortic systolic Murmur, Respiratory: Bilateral  Air entry equal and Decreased, no use of accessory muscle, basal Crackles, no wheezes Abdomen: Bowel Sound present, Soft and no tenderness Skin: left leg redness, no Rash, no induration Extremities: bilateral Pedal edema, no calf tenderness Neurologic: Grossly no focal neuro deficit. Bilaterally Equal motor strength  Data Reviewed: CBC:  Recent Labs Lab 08/10/16 1227 08/11/16 0203 08/12/16 0453  WBC 5.6 6.1 4.4  NEUTROABS 4.7  --   --   HGB 12.1 10.9* 9.9*  HCT 39.2 36.7 33.0*  MCV 97.8 98.7 99.1  PLT 104* 90* 89*   Basic Metabolic Panel:  Recent  Labs Lab 08/10/16 1227 08/10/16 1758 08/11/16 0203 08/11/16 2351  NA 140  --  144 140  K 4.2  --  4.2 4.1  CL 99*  --  104 104  CO2 31  --  32 28  GLUCOSE 216*  --  144* 213*  BUN 18  --  21* 24*  CREATININE 1.20*  --  1.29* 1.22*  CALCIUM 9.5  --  8.9 8.6*  MG  --  1.7  --   --   PHOS  --  2.2*  --   --     Liver Function Tests:  Recent Labs Lab 08/10/16 1227 08/11/16 0203  AST 18 21  ALT 9* 9*  ALKPHOS 50 41  BILITOT 0.9 1.1  PROT 6.8 5.8*  ALBUMIN 3.5 2.9*   No results for input(s): LIPASE, AMYLASE in the last 168 hours. No results for input(s): AMMONIA in the last 168 hours. Coagulation Profile:  Recent Labs Lab 08/10/16 1624  INR 1.51   Cardiac Enzymes:  Recent Labs Lab 08/10/16 1227  TROPONINI 0.03*   BNP (last 3 results) No results for input(s): PROBNP in the last 8760 hours. CBG:  Recent Labs Lab 08/11/16 1654 08/11/16 2144 08/12/16 0754 08/12/16 1124 08/12/16 1635  GLUCAP 158* 192* 193* 292* 241*   Studies: No results found.  Scheduled Meds: . apixaban  5 mg Oral BID  . calcium-vitamin D  1 tablet Oral BID  . dextromethorphan-guaiFENesin  1 tablet Oral BID  . doxycycline (VIBRAMYCIN) IV  100 mg Intravenous Q12H  . ferrous sulfate  325 mg Oral Q supper  . furosemide  40 mg Intravenous Daily  . insulin aspart  0-15 Units Subcutaneous TID WC  . insulin aspart  0-5 Units  Subcutaneous QHS  . insulin glargine  30 Units Subcutaneous QHS  . levETIRAcetam  500 mg Oral BID  . mouth rinse  15 mL Mouth Rinse BID  . metoprolol tartrate  12.5 mg Oral BID  . pantoprazole  40 mg Oral BID AC  . potassium chloride SA  20 mEq Oral Daily  . pregabalin  75 mg Oral QHS  . rOPINIRole  2 mg Oral TID  . saccharomyces boulardii  250 mg Oral BID  . sodium chloride flush  3 mL Intravenous Q12H  . [START ON 08/15/2016] Vitamin D (Ergocalciferol)  50,000 Units Oral Q Sat   Continuous Infusions: PRN Meds: acetaminophen **OR** acetaminophen, albuterol, docusate sodium, hydrOXYzine, ipratropium-albuterol, naphazoline-pheniramine, ondansetron **OR** ondansetron (ZOFRAN) IV, polyethylene glycol  Time spent: 30 minutes  Author: Berle Mull, MD [facility] Pager: [phone] 08/12/2016 5:15 PM  If 7PM-7AM, please contact night-coverage at [email or web address], password [facility]

## 2016-08-13 LAB — COMPREHENSIVE METABOLIC PANEL
ALBUMIN: 2.7 g/dL — AB (ref 3.5–5.0)
ALT: 8 U/L — ABNORMAL LOW (ref 14–54)
ANION GAP: 7 (ref 5–15)
AST: 12 U/L — AB (ref 15–41)
Alkaline Phosphatase: 43 U/L (ref 38–126)
BUN: 22 mg/dL — AB (ref 6–20)
CO2: 30 mmol/L (ref 22–32)
Calcium: 8.7 mg/dL — ABNORMAL LOW (ref 8.9–10.3)
Chloride: 102 mmol/L (ref 101–111)
Creatinine, Ser: 1.12 mg/dL — ABNORMAL HIGH (ref 0.44–1.00)
GFR calc Af Amer: 49 mL/min — ABNORMAL LOW (ref >60)
GFR calc non Af Amer: 43 mL/min — ABNORMAL LOW (ref >60)
GLUCOSE: 220 mg/dL — AB (ref 65–99)
POTASSIUM: 3.9 mmol/L (ref 3.5–5.1)
Sodium: 139 mmol/L (ref 135–145)
Total Bilirubin: 0.6 mg/dL (ref 0.3–1.2)
Total Protein: 5.5 g/dL — ABNORMAL LOW (ref 6.5–8.1)

## 2016-08-13 LAB — GLUCOSE, CAPILLARY
GLUCOSE-CAPILLARY: 252 mg/dL — AB (ref 65–99)
GLUCOSE-CAPILLARY: 272 mg/dL — AB (ref 65–99)
GLUCOSE-CAPILLARY: 429 mg/dL — AB (ref 65–99)
Glucose-Capillary: 277 mg/dL — ABNORMAL HIGH (ref 65–99)

## 2016-08-13 LAB — CBC WITH DIFFERENTIAL/PLATELET
BASOS ABS: 0 10*3/uL (ref 0.0–0.1)
BASOS PCT: 0 %
EOS PCT: 3 %
Eosinophils Absolute: 0.1 10*3/uL (ref 0.0–0.7)
HCT: 32.6 % — ABNORMAL LOW (ref 36.0–46.0)
Hemoglobin: 9.6 g/dL — ABNORMAL LOW (ref 12.0–15.0)
Lymphocytes Relative: 34 %
Lymphs Abs: 0.9 10*3/uL (ref 0.7–4.0)
MCH: 29 pg (ref 26.0–34.0)
MCHC: 29.4 g/dL — ABNORMAL LOW (ref 30.0–36.0)
MCV: 98.5 fL (ref 78.0–100.0)
MONO ABS: 0.3 10*3/uL (ref 0.1–1.0)
Monocytes Relative: 10 %
NEUTROS ABS: 1.5 10*3/uL — AB (ref 1.7–7.7)
Neutrophils Relative %: 52 %
PLATELETS: 106 10*3/uL — AB (ref 150–400)
RBC: 3.31 MIL/uL — ABNORMAL LOW (ref 3.87–5.11)
RDW: 14.1 % (ref 11.5–15.5)
WBC: 2.8 10*3/uL — ABNORMAL LOW (ref 4.0–10.5)

## 2016-08-13 LAB — LACTATE DEHYDROGENASE: LDH: 174 U/L (ref 98–192)

## 2016-08-13 LAB — PROTIME-INR
INR: 1.39
Prothrombin Time: 17.2 seconds — ABNORMAL HIGH (ref 11.4–15.2)

## 2016-08-13 LAB — VITAMIN B12: VITAMIN B 12: 471 pg/mL (ref 180–914)

## 2016-08-13 LAB — MAGNESIUM: Magnesium: 1.7 mg/dL (ref 1.7–2.4)

## 2016-08-13 LAB — FOLATE: Folate: 6.4 ng/mL (ref >5.9)

## 2016-08-13 LAB — FIBRINOGEN: FIBRINOGEN: 547 mg/dL — AB (ref 210–475)

## 2016-08-13 MED ORDER — INSULIN ASPART 100 UNIT/ML ~~LOC~~ SOLN
12.0000 [IU] | Freq: Once | SUBCUTANEOUS | Status: AC
  Administered 2016-08-13: 12 [IU] via SUBCUTANEOUS

## 2016-08-13 NOTE — Evaluation (Signed)
Physical Therapy Evaluation Patient Details Name: Elizabeth Pugh MRN: [id number] DOB: 1927-02-15 Today's Date: 08/13/2016   History of Present Illness  Pt is an 83 y.o. female admitted to ED on 08/09/16 with c/o confusion; found to have sepsis likely from cellulitis. Pertinent PMH includes DM, CVA, HTN, a-fib, seizure, CHF, morbid obesity.   Clinical Impression  Pt presents to PT functioning at her baseline level; pt reports being bedbound and non-ambulatory for ~4 years. Requires totalA for ADLs and hoyer lift for OOB; lives with daughter. Daughter and home aide provide total care. No further acute needs at this time. PT will sign off.    Follow Up Recommendations No PT follow up;Supervision/Assistance - 24 hour    Equipment Recommendations  Hospital bed    Recommendations for Other Services       Precautions / Restrictions Precautions Precautions: Fall Restrictions Weight Bearing Restrictions: No      Mobility  Bed Mobility Overal bed mobility: Needs Assistance Bed Mobility: Rolling Rolling: Total assist         General bed mobility comments: Pt unable to roll in bed despite maxA and multimodal cues to reach UE to bed rail; pt reports she requires totalA to roll at home.   Transfers                 General transfer comment: Requires lift equipment at baseline  Ambulation/Gait                Stairs            Wheelchair Mobility    Modified Rankin (Stroke Patients Only)       Balance Overall balance assessment:  (Bedbound at baseline)                                           Pertinent Vitals/Pain Pain Assessment: No/denies pain    Home Living Family/patient expects to be discharged to:: Private residence Living Arrangements: Children Available Help at Discharge: Family;Available 24 hours/day;Personal care attendant Type of Home: House Home Access: Ramped entrance     Home Layout: One level Home Equipment:  Wheelchair - Teacher, adult education;Bedside commode;Other (comment) (Hoyer lift) Additional Comments: Pt reports aide who comes daily ("as long as I need him") and daughter perform all aspects of self care; pt able to feed herself.     Prior Function Level of Independence: Needs assistance   Gait / Transfers Assistance Needed: Pt reports bedbound and non-ambulatory for ~4 years. Hoyer lift for [employer] Needed: TotalA from home aide or daughter for bathing, dressing, and toileting        Hand Dominance        Extremity/Trunk Assessment   Upper Extremity Assessment Upper Extremity Assessment: Generalized weakness    Lower Extremity Assessment Lower Extremity Assessment: RLE deficits/detail;LLE deficits/detail RLE Deficits / Details: Gross RLE strength 2/5; redness and edema noted.  LLE Deficits / Details: Gross LLE strength 2/5; redness and edma noted.        Communication   Communication: No difficulties  Cognition Arousal/Alertness: Awake/alert Behavior During Therapy: WFL for tasks assessed/performed Overall Cognitive Status: Within Functional Limits for tasks assessed                                 General Comments: Pt unreliable  historian, reporting she is able to climb steps with assist from daughter to get into home      General Comments General comments (skin integrity, edema, etc.): Encouraged BLE ankle pumps and ROM as able    Exercises     Assessment/Plan    PT Assessment Patent does not need any further PT services  PT Problem List         PT Treatment Interventions      PT Goals (Current goals can be found in the Care Plan section)  Acute Rehab PT Goals Patient Stated Goal: Return home  PT Goal Formulation: All assessment and education complete, DC therapy Time For Goal Achievement: 08/27/16 Potential to Achieve Goals: Good    Frequency     Barriers to discharge        Co-evaluation                End of Session Equipment Utilized During Treatment: Oxygen Activity Tolerance: Patient limited by fatigue Patient left: in bed;with call bell/phone within reach Nurse Communication: Mobility status PT Visit Diagnosis: Muscle weakness (generalized) (M62.81)    Time: 9791-5041 PT Time Calculation (min) (ACUTE ONLY): 12 min   Charges:   PT Evaluation $PT Eval Low Complexity: 1 Procedure     PT G Codes:       Enis Gash, SPT Office-[phone]  Mabeline Caras 08/13/2016, 4:05 PM

## 2016-08-13 NOTE — Progress Notes (Signed)
Placed pt on Cpap tolerating well. 

## 2016-08-13 NOTE — Progress Notes (Signed)
[facility] Progress Note  Patient: Elizabeth Pugh RDE:[id number]   PCP: Philis Fendt, MD DOB: 1926/11/19   DOA: 08/10/2016   DOS: 08/13/2016   Date of Service: the patient was seen and examined on 08/13/2016  Subjective: No nausea no vomiting diuresing well.  Brief hospital course: Pt. with PMH of DM, CVA, HTN, A. fib, seizure, CHF, morbid obesity; admitted on 08/10/2016, with complaint of confusion, was found to have sepsis likely from cellulitis. Currently further plan is continue to monitor after change of the antibiotics.  Assessment and Plan: 1. Sepsis ([place]) from cellulitis. Blood cultures currently negative. MRSA PCR negative. Urine antigens are negative as well. Patient was initially given IV antibiotics with vancomycin and meniscectomy as well as IV fluids. Currently fluids are stopped. We'll change antibiotics to doxycycline. Monitor. We'll finish a 7 day treatment course. Likely has cellulitis is due to chronic venous stasis as well as lymphedema and patient will remain at high risk for reoccurrence of infection. This was informed to the family.  2. Acute on chronic combined CHF. Mildly overloaded after receiving IV fluids. Initially IV fluids were stopped and by mouth Lasix was resumed. Continue IV Lasix Daily weights and ins and outs.  3. Acute on chronic respiratory failure with hypoxia and hypercapnia. Chronically on oxygen during the day as well as on nocturnal BiPAP. ABG in the ER showed mild worsening of her CO2 Currently on home oxygen during the day. Tolerating C Pap every night. We'll monitor.  4. Type 2 diabetes mellitus. Uncontrolled. Hyperglycemia. Hemoglobin A1c in February was 9.4. Continue home Lantus and sliding scale insulin at present.  5. Chronic A. fib. History of PE. History of CVA.  morbid obesity with limited mobility.  Continue Toprol and eliquis CHADVASc = 8 Lower extremity Doppler negative for any DVT.  6. Seizure  disorder. Continue Keppra.  7. Thrombocytopenia. Acute on chronic, likely in the setting of sepsis. We'll monitor. Normal B-12, normal LDH, platelet counts improving today. Differential stable. No schistocytes are reported.  Bowel regimen: last BM 08/11/2016 Diet: cardaic diet DVT Prophylaxis: Eliquis  Advance goals of care discussion: DNR/DNI  Family Communication: no family was present at bedside, at the time of interview.   Disposition:  Discharge to home. Expected discharge date: 08/14/2016, stabilization of cellulitis and sepsis physiology.  Consultants: none Procedures: none  Antibiotics: Anti-infectives    Start     Dose/Rate Route Frequency Ordered Stop   08/12/16 1600  doxycycline (VIBRAMYCIN) 100 mg in dextrose 5 % 250 mL IVPB     100 mg 125 mL/hr over 120 Minutes Intravenous Every 12 hours 08/12/16 1525     08/11/16 1500  vancomycin (VANCOCIN) 1,500 mg in sodium chloride 0.9 % 500 mL IVPB  Status:  Discontinued     1,500 mg 250 mL/hr over 120 Minutes Intravenous Every 24 hours 08/10/16 1350 08/10/16 1355   08/11/16 1500  vancomycin (VANCOCIN) IVPB 1000 mg/200 mL premix  Status:  Discontinued     1,000 mg 200 mL/hr over 60 Minutes Intravenous Every 24 hours 08/10/16 1355 08/12/16 1040   08/10/16 2200  metroNIDAZOLE (FLAGYL) IVPB 500 mg  Status:  Discontinued     500 mg 100 mL/hr over 60 Minutes Intravenous Every 8 hours 08/10/16 1303 08/10/16 1347   08/10/16 2200  aztreonam (AZACTAM) 1 g in dextrose 5 % 50 mL IVPB  Status:  Discontinued     1 g 100 mL/hr over 30 Minutes Intravenous Every 8 hours 08/10/16 1350 08/12/16 1525  08/10/16 1345  levofloxacin (LEVAQUIN) IVPB 750 mg  Status:  Discontinued     750 mg 100 mL/hr over 90 Minutes Intravenous  Once 08/10/16 1337 08/10/16 1347   08/10/16 1345  aztreonam (AZACTAM) 2 g in dextrose 5 % 50 mL IVPB  Status:  Discontinued     2 g 100 mL/hr over 30 Minutes Intravenous  Once 08/10/16 1337 08/10/16 1344   08/10/16  1345  vancomycin (VANCOCIN) IVPB 1000 mg/200 mL premix  Status:  Discontinued     1,000 mg 200 mL/hr over 60 Minutes Intravenous  Once 08/10/16 1337 08/10/16 1345   08/10/16 1200  aztreonam (AZACTAM) 2 g in dextrose 5 % 50 mL IVPB     2 g 100 mL/hr over 30 Minutes Intravenous  Once 08/10/16 1155 08/10/16 1322   08/10/16 1200  metroNIDAZOLE (FLAGYL) IVPB 500 mg  Status:  Discontinued     500 mg 100 mL/hr over 60 Minutes Intravenous  Once 08/10/16 1155 08/10/16 1347   08/10/16 1200  vancomycin (VANCOCIN) IVPB 1000 mg/200 mL premix  Status:  Discontinued     1,000 mg 200 mL/hr over 60 Minutes Intravenous  Once 08/10/16 1155 08/10/16 1157   08/10/16 1200  vancomycin (VANCOCIN) 2,000 mg in sodium chloride 0.9 % 500 mL IVPB     2,000 mg 250 mL/hr over 120 Minutes Intravenous  Once 08/10/16 1157 08/10/16 1602       Objective: Physical Exam: Vitals:   08/13/16 0422 08/13/16 0747 08/13/16 1104 08/13/16 1224  BP: 137/75  129/63 125/75  Pulse: 82  73 83  Resp: 20   18  Temp: 97.6 F (36.4 C)   97.3 F (36.3 C)  TempSrc: Oral     SpO2: 100%   100%  Weight:  132.5 kg (292 lb 3.2 oz)    Height:        Intake/Output Summary (Last 24 hours) at 08/13/16 1630 Last data filed at 08/13/16 1300  Gross per 24 hour  Intake             1270 ml  Output                0 ml  Net             1270 ml   Filed Weights   08/11/16 0325 08/12/16 0657 08/13/16 0747  Weight: 131.2 kg (289 lb 4.8 oz) 131.6 kg (290 lb 3.2 oz) 132.5 kg (292 lb 3.2 oz)   General: Alert, Awake and Oriented to Time, Place and Person. Appear in mild distress, affect appropriate Eyes: PERRL, Conjunctiva normal ENT: Oral Mucosa clear moist. Neck: difficult to assess JVD, no Abnormal Mass Or lumps Cardiovascular: S1 and S2 Present, aortic systolic Murmur, Respiratory: Bilateral Air entry equal and Decreased, no use of accessory muscle, basal Crackles, no wheezes Abdomen: Bowel Sound present, Soft and no tenderness Skin: left  leg redness, no Rash, no induration Extremities: bilateral Pedal edema, no calf tenderness Neurologic: Grossly no focal neuro deficit. Bilaterally Equal motor strength  Data Reviewed: CBC:  Recent Labs Lab 08/10/16 1227 08/11/16 0203 08/12/16 0453 08/13/16 0242  WBC 5.6 6.1 4.4 2.8*  NEUTROABS 4.7  --   --  1.5*  HGB 12.1 10.9* 9.9* 9.6*  HCT 39.2 36.7 33.0* 32.6*  MCV 97.8 98.7 99.1 98.5  PLT 104* 90* 89* 518*   Basic Metabolic Panel:  Recent Labs Lab 08/10/16 1227 08/10/16 1758 08/11/16 0203 08/11/16 2351 08/13/16 0242  NA 140  --  144 140  139  K 4.2  --  4.2 4.1 3.9  CL 99*  --  104 104 102  CO2 31  --  32 28 30  GLUCOSE 216*  --  144* 213* 220*  BUN 18  --  21* 24* 22*  CREATININE 1.20*  --  1.29* 1.22* 1.12*  CALCIUM 9.5  --  8.9 8.6* 8.7*  MG  --  1.7  --   --  1.7  PHOS  --  2.2*  --   --   --     Liver Function Tests:  Recent Labs Lab 08/10/16 1227 08/11/16 0203 08/13/16 0242  AST 18 21 12*  ALT 9* 9* 8*  ALKPHOS 50 41 43  BILITOT 0.9 1.1 0.6  PROT 6.8 5.8* 5.5*  ALBUMIN 3.5 2.9* 2.7*   No results for input(s): LIPASE, AMYLASE in the last 168 hours. No results for input(s): AMMONIA in the last 168 hours. Coagulation Profile:  Recent Labs Lab 08/10/16 1624 08/13/16 0242  INR 1.51 1.39   Cardiac Enzymes:  Recent Labs Lab 08/10/16 1227  TROPONINI 0.03*   BNP (last 3 results) No results for input(s): PROBNP in the last 8760 hours. CBG:  Recent Labs Lab 08/12/16 1124 08/12/16 1635 08/12/16 2055 08/13/16 0750 08/13/16 1154  GLUCAP 292* 241* 241* 252* 277*   Studies: No results found.  Scheduled Meds: . apixaban  5 mg Oral BID  . calcium-vitamin D  1 tablet Oral BID  . dextromethorphan-guaiFENesin  1 tablet Oral BID  . doxycycline (VIBRAMYCIN) IV  100 mg Intravenous Q12H  . ferrous sulfate  325 mg Oral Q supper  . furosemide  40 mg Intravenous Daily  . insulin aspart  0-15 Units Subcutaneous TID WC  . insulin aspart  0-5  Units Subcutaneous QHS  . insulin glargine  30 Units Subcutaneous QHS  . levETIRAcetam  500 mg Oral BID  . mouth rinse  15 mL Mouth Rinse BID  . metoprolol tartrate  12.5 mg Oral BID  . pantoprazole  40 mg Oral BID AC  . potassium chloride SA  20 mEq Oral Daily  . pregabalin  75 mg Oral QHS  . rOPINIRole  2 mg Oral TID  . saccharomyces boulardii  250 mg Oral BID  . sodium chloride flush  3 mL Intravenous Q12H  . [START ON 08/15/2016] Vitamin D (Ergocalciferol)  50,000 Units Oral Q Sat   Continuous Infusions: PRN Meds: acetaminophen **OR** acetaminophen, albuterol, docusate sodium, hydrOXYzine, ipratropium-albuterol, naphazoline-pheniramine, ondansetron **OR** ondansetron (ZOFRAN) IV, polyethylene glycol  Time spent: 30 minutes  Author: Berle Mull, MD [facility] Pager: [phone] 08/13/2016 4:30 PM  If 7PM-7AM, please contact night-coverage at [email or web address], password [facility]

## 2016-08-14 LAB — BASIC METABOLIC PANEL
Anion gap: 6 (ref 5–15)
BUN: 23 mg/dL — ABNORMAL HIGH (ref 6–20)
CHLORIDE: 102 mmol/L (ref 101–111)
CO2: 32 mmol/L (ref 22–32)
CREATININE: 0.95 mg/dL (ref 0.44–1.00)
Calcium: 9.2 mg/dL (ref 8.9–10.3)
GFR calc non Af Amer: 52 mL/min — ABNORMAL LOW (ref >60)
Glucose, Bld: 154 mg/dL — ABNORMAL HIGH (ref 65–99)
POTASSIUM: 4.1 mmol/L (ref 3.5–5.1)
SODIUM: 140 mmol/L (ref 135–145)

## 2016-08-14 LAB — GLUCOSE, CAPILLARY
GLUCOSE-CAPILLARY: 136 mg/dL — AB (ref 65–99)
GLUCOSE-CAPILLARY: 165 mg/dL — AB (ref 65–99)

## 2016-08-14 LAB — PROCALCITONIN: Procalcitonin: 0.4 ng/mL

## 2016-08-14 LAB — MAGNESIUM: MAGNESIUM: 1.8 mg/dL (ref 1.7–2.4)

## 2016-08-14 MED ORDER — FUROSEMIDE 10 MG/ML IJ SOLN
60.0000 mg | Freq: Two times a day (BID) | INTRAMUSCULAR | Status: DC
  Administered 2016-08-14: 60 mg via INTRAVENOUS
  Filled 2016-08-14: qty 6

## 2016-08-14 MED ORDER — DOXYCYCLINE HYCLATE 100 MG PO TABS: 100.0000 mg | ORAL_TABLET | Freq: Two times a day (BID) | ORAL | Status: DC

## 2016-08-14 MED ORDER — DOXYCYCLINE HYCLATE 100 MG PO TABS: 100.0000 mg | ORAL_TABLET | Freq: Two times a day (BID) | ORAL | 0 refills | Status: AC

## 2016-08-14 MED ORDER — SACCHAROMYCES BOULARDII 250 MG PO CAPS: 250.0000 mg | ORAL_CAPSULE | Freq: Two times a day (BID) | ORAL | 0 refills | Status: AC

## 2016-08-14 NOTE — Clinical Social Work Note (Signed)
CSW acknowledges SNF consult. PT recommending no PT follow up due to patient being at baseline and bed bound for four years. No skillable needs.  CSW signing off. Consult again if any other social work needs arise.  Dayton Scrape, [place]

## 2016-08-14 NOTE — Care Management Important Message (Signed)
Important Message  Patient Details  Name: Elizabeth Pugh MRN: [id number] Date of Birth: 09/29/1926   Medicare Important Message Given:  Yes    Kaydence Baba [address] 08/14/2016, 2:32 PM

## 2016-08-14 NOTE — Progress Notes (Signed)
Pt placed on cpap with 3 lpm bled in oxygen tolerating well.

## 2016-08-14 NOTE — Care Management Note (Signed)
Case Management Note  Patient Details  Name: Elizabeth Pugh MRN: [id number] Date of Birth: Apr 03, 1927  Subjective/Objective:    Admitted with Sepsis               Action/Plan: Patient lives at home with her daughter; PCP: Philis Fendt, MD; has Medicare; patient is active with [facility] as prior to admission. [name] with [facility] is aware of discharge home today; CM talked to daughter [name], she is aware of discharge and resumption of [facility] services; she requested ambulance transportation home; home address verified; [facility] called for 1:30 pm pick up.  Expected Discharge Date:  08/14/16               Expected Discharge Plan:  [place]  Discharge planning Services  CM Consult [facility] Agency:  [facility] (now [facility])  Status of Service:  Completed, signed off  Sherrilyn Rist [phone] 08/14/2016, 11:15 AM

## 2016-08-14 NOTE — Progress Notes (Signed)
Patient has order to discharge home. Discharge instruction given and patient verbalizes understanding. Sister at bedside. IV and telemetry removed. [facility] to transport at 1:30. Patient and family aware. Patient stable.

## 2016-08-14 NOTE — Progress Notes (Signed)
Per transport request [facility] spoke with [name] patient's daughter to verify that the patient has a DNR form at home and to explain that because the transporters do not have the copy the patient will be a full code for her ride home. Daughter verbalizes that she understand that the patient will receive CPR if necessary during her ride home.

## 2016-08-15 LAB — CULTURE, BLOOD (ROUTINE X 2)
CULTURE: NO GROWTH
CULTURE: NO GROWTH

## 2016-08-20 NOTE — Discharge Summary (Signed)
[facility] Discharge Summary   Patient: Elizabeth Pugh NOM:[id number]   PCP: Philis Fendt, MD DOB: [birth year]   Date of admission: 08/10/2016   Date of discharge: 08/14/2016    Discharge Diagnoses:  Principal Problem:   Sepsis ([place]) Active Problems:   DM (diabetes mellitus), type 2, uncontrolled with complications ([place])   CVA, old, hemiparesis ([place])   Hypertension   Chronic atrial fibrillation ([place])   Seizure disorder ([place])   History of pulmonary embolism   Chronic combined systolic and diastolic heart failure (HCC)   Thrombocytopenia (HCC)   Acute on chronic respiratory failure with hypoxia and hypercapnia ([place])   Morbid obesity with BMI of 45.0-49.9, adult ([place])   Cellulitis of left lower extremity   Admitted From: home Disposition:  Home with home health  Recommendations for Outpatient Follow-up:  1. Please follow up with PCP in 1 week   Follow-up Information    Philis Fendt, MD Follow up on 08/21/2016.   Specialty:  Internal Medicine Why:  At 12:15am for hospital follow up  Contact information: [place] [address] [address] [facility] up.   Specialty:  [place] Why:  They will continue to do your home health care at your home Contact information: [address] [place] [address] [phone]          Diet recommendation: cardiac diet  Activity: The patient is advised to gradually reintroduce usual activities.  Discharge Condition: good  Code Status: DNR DNI  History of present illness: As per the H and P dictated on admission, "Elizabeth Pugh is a 83 y.o. female with medical history significant for morbid obesity, chronic combined systolic and diastolic heart failure, chronic hypoxemia on nasal cannula oxygen during the day and BiPAP nocturnally, seizure disorder on AED, hypertension, history of PE, diabetes on insulin, chronic atrial fibrillation on eliquis, chronic  thrombocytopenia and history of CVA with progressive decline in function with patient now bedbound status. Patient was in her usual state of health until yesterday when family noted she was slightly less responsive than baseline. This morning patient was quite altered and not as communicative or jocular as is her baseline. At home she was found to have a fever over 101F. Home health nurse also evaluated the patient and due to her lack of responsiveness was concerned over acute hypercapnia so patient was placed back on BiPAP and EMS was called to the home. Upon EMS arrival to the home the patient demonstrated bilateral wheezing and grunting respiratory effort and she was subsequently placed on a nonrebreather mask and given 5 mg albuterol neb treatment. Upon arrival to the ER patient had an oral temperature of 102.9 F, tachypnea and tachycardia but was normotensive. Subsequently she has been given Tylenol and apparently has defervesced and has become more alert and has been weaned to 3 L nasal cannula oxygen which is her usual daytime requirement. ABG revealed stable hypercarbia with a normal pH but acute hypoxia with pO2 lower than baseline at 65. Lactic acid was elevated at greater than 3. BNP was slightly elevated at 1.86. Chest x-ray without focal infiltrate or edema. In addition to the above findings family also noted patient with bright red skin changes to left leg which are new as of this morning and have increased and extended up to the thigh level since arrival to the ER. Broad-spectrum antibiotics have been given in the ER for sepsis  possibly of multifactorial etiology."  Hospital Course:  Summary of her active problems in the hospital is as following.  1. Sepsis ([place]) from cellulitis. Blood cultures currently negative. MRSA PCR negative. Urine antigens are negative as well. Patient was initially given IV antibiotics with vancomycin and cefepime as well as IV fluids. Currently fluids are  stopped. We'll change antibiotics to doxycycline. We'll finish a 7 day treatment course. Likely has cellulitis is due to chronic venous stasis as well as lymphedema and patient will remain at high risk for reoccurrence of infection. This was informed to the family.  2. Acute on chronic combined CHF. Mildly overloaded after receiving IV fluids. Initially IV fluids were stopped and by mouth Lasix was resumed. Given IV Lasix  3. Acute on chronic respiratory failure with hypoxia and hypercapnia. Chronically on oxygen during the day as well as on nocturnal BiPAP. ABG in the ER showed mild worsening of her CO2 Currently on home oxygen during the day. Tolerating C Pap every night. We'll monitor.  4. Type 2 diabetes mellitus. Uncontrolled. Hyperglycemia. Hemoglobin A1c in February was 9.4. Continue home Lantus and sliding scale insulin at present.  5. Chronic A. fib. History of PE. History of CVA.  morbid obesity with limited mobility.  Continue Toprol and eliquis CHADVASc = 8 Lower extremity Doppler negative for any DVT.  6. Seizure disorder. Continue Keppra.  7. Thrombocytopenia. Acute on chronic, likely in the setting of sepsis. We'll monitor. Normal B-12, normal LDH, platelet counts improving today. Differential stable. No schistocytes are reported.  All other chronic medical condition were stable during the hospitalization.  Patient was seen by physical therapy, who recommended home health, which was arranged by Education officer, museum and case Freight forwarder. On the day of the discharge the patient's vitals were stable, and no other acute medical condition were reported by patient. the patient was felt safe to be discharge at home with home health.  Procedures and Results:  none   Consultations:  none  DISCHARGE MEDICATION: Discharge Medication List as of 08/14/2016 11:16 AM    START taking these medications   Details  doxycycline (VIBRA-TABS) 100 MG tablet Take 1 tablet  (100 mg total) by mouth every 12 (twelve) hours., Starting Fri 08/14/2016, Until Tue 08/18/2016, Normal    saccharomyces boulardii (FLORASTOR) 250 MG capsule Take 1 capsule (250 mg total) by mouth 2 (two) times daily., Starting Fri 08/14/2016, Until Thu 08/20/2016, Normal      CONTINUE these medications which have NOT CHANGED   Details  albuterol (PROVENTIL HFA;VENTOLIN HFA) 108 (90 BASE) MCG/ACT inhaler Inhale 1 puff into the lungs every 6 (six) hours as needed for wheezing or shortness of breath., Historical Med    apixaban (ELIQUIS) 5 MG TABS tablet Take 1 tablet (5 mg total) by mouth 2 (two) times daily., Starting Fri 09/27/2015, Normal    calcium-vitamin D (CALCIUM 500+D) 500-400 MG-UNIT per tablet Take 1 tablet by mouth 2 (two) times daily., Historical Med    dextromethorphan-guaiFENesin (MUCINEX DM) 30-600 MG 12hr tablet Take 1 tablet by mouth 2 (two) times daily. Take for 1 week, Historical Med    Docusate Calcium (STOOL SOFTENER PO) Take 100 mg by mouth daily as needed (For constipation.). Colace, Historical Med    ferrous sulfate 325 (65 FE) MG tablet Take 325 mg by mouth daily with supper. , Historical Med    furosemide (LASIX) 40 MG tablet Take 1.5 tablets (60 mg total) by mouth 2 (two) times daily., Starting Tue 07/21/2016, Normal  hydrOXYzine (ATARAX/VISTARIL) 25 MG tablet Take 25 mg by mouth every 8 (eight) hours as needed for itching. , Starting Sun 05/10/2016, Historical Med    insulin aspart (NOVOLOG) 100 UNIT/ML injection Inject 0-12 Units into the skin as needed for high blood sugar. 0-150 = 0 units, 151-200 = 2 units, 201-250 = 4 units, 251-300 = 6 units, 301-350 = 8 units, 351-400 = 10 units, 400+ = 12 units and notify NP/MD, Historical Med    insulin glargine (LANTUS) 100 UNIT/ML injection Inject 0.2 mLs (20 Units total) into the skin at bedtime., Starting Tue 06/23/2016, No Print    ipratropium-albuterol (DUONEB) 0.5-2.5 (3) MG/3ML SOLN Take 3 mLs by nebulization. Every 6  hours while awake x5 days, then q6h prn for dyspnea and wheezing, Historical Med    levETIRAcetam (KEPPRA) 500 MG tablet Take 1 tablet (500 mg total) by mouth 2 (two) times daily., Starting Sat 02/27/2012, Print    metoprolol tartrate (LOPRESSOR) 25 MG tablet Take 0.5 tablets (12.5 mg total) by mouth 2 (two) times daily., Starting Tue 06/23/2016, Normal    Naphazoline-Pheniramine (ALLERGY EYE OP) Place 1 drop into both eyes as needed (For eye irritation.)., Historical Med    pantoprazole (PROTONIX) 40 MG tablet Take 1 tablet (40 mg total) by mouth 2 (two) times daily before a meal., Starting Tue 10/01/2015, Normal    polyethylene glycol (MIRALAX / GLYCOLAX) packet Take 17 g by mouth daily as needed for mild constipation., Starting Thu 06/11/2016, Normal    potassium chloride SA (K-DUR,KLOR-CON) 20 MEQ tablet Take 1 tablet (20 mEq total) by mouth daily., Starting Tue 07/21/2016, Normal    pregabalin (LYRICA) 75 MG capsule Take 75 mg by mouth at bedtime. , Historical Med    rOPINIRole (REQUIP) 2 MG tablet Take 1 tablet (2 mg total) by mouth 3 (three) times daily., Starting Fri 09/27/2015, Normal    Vitamin D, Ergocalciferol, (DRISDOL) 50000 units CAPS capsule Take 50,000 Units by mouth every Saturday. No specific day , Historical Med    VOLTAREN 1 % GEL Apply 1 application topically 4 (four) times daily as needed (For knee pain.). , Starting Mon 10/26/2011, Historical Med       Allergies  Allergen Reactions  . Erythromycin Other (See Comments)    Vaginal itching  . Penicillins Rash and Other (See Comments)    Has patient had a PCN reaction causing immediate rash, facial/tongue/throat swelling, SOB or lightheadedness with hypotension: No Has patient had a PCN reaction causing severe rash involving mucus membranes or skin necrosis: No Has patient had a PCN reaction that required hospitalization: No Has patient had a PCN reaction occurring within the last 10 years: No If all of the above answers  are "NO", then may proceed with Cephalosporin use.  Marland Kitchen Zithromax [Azithromycin] Other (See Comments)    gave her a yeast infection.   Discharge Instructions    Diet - low sodium heart healthy    Complete by:  As directed    Increase activity slowly    Complete by:  As directed      Discharge Exam: Filed Weights   08/11/16 0325 08/12/16 0657 08/13/16 0747  Weight: 131.2 kg (289 lb 4.8 oz) 131.6 kg (290 lb 3.2 oz) 132.5 kg (292 lb 3.2 oz)   Vitals:   08/13/16 1920 08/14/16 0532  BP: 111/64 120/64  Pulse: 79 82  Resp: 18 18  Temp: 97.7 F (36.5 C) 98.2 F (36.8 C)   General: Appear in no distress, no Rash; Oral  Mucosa moist. Cardiovascular: S1 and S2 Present, no Murmur, no JVD Respiratory: Bilateral Air entry present and Clear to Auscultation, no Crackles, no wheezes Abdomen: Bowel Sound present, Soft and no tenderness Extremities: bilateral Pedal edema, no calf tenderness Neurology: Grossly no focal neuro deficit.  The results of significant diagnostics from this hospitalization (including imaging, microbiology, ancillary and laboratory) are listed below for reference.    Significant Diagnostic Studies: Portable Chest 1 View  Result Date: 08/11/2016 CLINICAL DATA:  Acute respiratory failure with hypoxia EXAM: PORTABLE CHEST 1 VIEW COMPARISON:  08/10/2016 FINDINGS: Cardiomegaly with vascular congestion. Low lung volumes. Small right pleural effusion again noted. No confluent opacities or edema. IMPRESSION: Cardiomegaly with vascular congestion.  Stable small right effusion. Electronically Signed   By: Rolm Baptise M.D.   On: 08/11/2016 07:18   Dg Chest Portable 1 View  Result Date: 08/10/2016 CLINICAL DATA:  Shortness of breath and cellulitis EXAM: PORTABLE CHEST 1 VIEW COMPARISON:  08/10/2016 FINDINGS: Cardiac shadow remains enlarged. The lungs are well aerated bilaterally. Some blunting of the right costophrenic angle consistent with small effusion on the right is noted better  visualize from the prior exam. No new focal infiltrate is seen. IMPRESSION: Small right effusion.  No other new focal abnormality is seen. Electronically Signed   By: Inez Catalina M.D.   On: 08/10/2016 19:22   Dg Chest Port 1 View  Result Date: 08/10/2016 CLINICAL DATA:  Tachycardia and lethargy.  Fever. EXAM: PORTABLE CHEST 1 VIEW COMPARISON:  July 13, 2016 FINDINGS: There is no edema or consolidation. Heart is enlarged with pulmonary vascularity within normal limits. No adenopathy. There is atherosclerotic calcification in the aorta. There is evidence of old trauma involving the left clavicle. IMPRESSION: Cardiomegaly. No edema or consolidation. There is aortic atherosclerosis. Electronically Signed   By: Lowella Grip III M.D.   On: 08/10/2016 12:33    Microbiology: Recent Results (from the past 240 hour(s))  Urine culture     Status: Abnormal   Collection Time: 08/10/16  3:39 PM  Result Value Ref Range Status   Specimen Description URINE, CATHETERIZED  Final   Special Requests NONE  Final   Culture <10,000 COLONIES/mL INSIGNIFICANT GROWTH (A)  Final   Report Status 08/11/2016 FINAL  Final  MRSA PCR Screening     Status: None   Collection Time: 08/10/16 11:01 PM  Result Value Ref Range Status   MRSA by PCR NEGATIVE NEGATIVE Final    Comment:        The GeneXpert MRSA Assay (FDA approved for NASAL specimens only), is one component of a comprehensive MRSA colonization surveillance program. It is not intended to diagnose MRSA infection nor to guide or monitor treatment for MRSA infections.      Labs: CBC: No results for input(s): WBC, NEUTROABS, HGB, HCT, MCV, PLT in the last 168 hours. Basic Metabolic Panel:  Recent Labs Lab 08/14/16 0830  NA 140  K 4.1  CL 102  CO2 32  GLUCOSE 154*  BUN 23*  CREATININE 0.95  CALCIUM 9.2  MG 1.8   Liver Function Tests: No results for input(s): AST, ALT, ALKPHOS, BILITOT, PROT, ALBUMIN in the last 168 hours. No results for  input(s): LIPASE, AMYLASE in the last 168 hours. No results for input(s): AMMONIA in the last 168 hours. Cardiac Enzymes: No results for input(s): CKTOTAL, CKMB, CKMBINDEX, TROPONINI in the last 168 hours. BNP (last 3 results)  Recent Labs  06/15/16 2250 07/13/16 1457 08/10/16 1228  BNP 233.2* 253.3* 186.5*  CBG:  Recent Labs Lab 08/13/16 1646 08/13/16 2113 08/14/16 0755 08/14/16 1250  GLUCAP 272* 429* 136* 165*   Time spent: 30 minutes  Signed:  PATEL, PRANAV  [facility] 08/14/2016 , 12:36 PM

## 2016-08-21 NOTE — Progress Notes (Signed)
Left message with family member to call back 

## 2016-08-23 ENCOUNTER — Inpatient Hospital Stay (HOSPITAL_COMMUNITY)
Admission: EM | Admit: 2016-08-23 | Discharge: 2016-08-28 | DRG: 602 | Disposition: A | Discharge status: Home Health | Payer: Medicare Other | Attending: Internal Medicine | Admitting: Internal Medicine

## 2016-08-23 ENCOUNTER — Encounter (HOSPITAL_COMMUNITY): Payer: Self-pay

## 2016-08-23 DIAGNOSIS — Y92230 Patient room in hospital as the place of occurrence of the external cause: Secondary | ICD-10-CM | POA: Diagnosis present

## 2016-08-23 DIAGNOSIS — Z825 Family history of asthma and other chronic lower respiratory diseases: Secondary | ICD-10-CM

## 2016-08-23 DIAGNOSIS — Z823 Family history of stroke: Secondary | ICD-10-CM | POA: Diagnosis not present

## 2016-08-23 DIAGNOSIS — Z841 Family history of disorders of kidney and ureter: Secondary | ICD-10-CM

## 2016-08-23 DIAGNOSIS — Z6841 Body Mass Index (BMI) 40.0 and over, adult: Secondary | ICD-10-CM | POA: Diagnosis not present

## 2016-08-23 DIAGNOSIS — G2581 Restless legs syndrome: Secondary | ICD-10-CM | POA: Diagnosis present

## 2016-08-23 DIAGNOSIS — Z7401 Bed confinement status: Secondary | ICD-10-CM

## 2016-08-23 DIAGNOSIS — Z79899 Other long term (current) drug therapy: Secondary | ICD-10-CM | POA: Diagnosis not present

## 2016-08-23 DIAGNOSIS — IMO0002 Reserved for concepts with insufficient information to code with codable children: Secondary | ICD-10-CM | POA: Diagnosis present

## 2016-08-23 DIAGNOSIS — Z880 Allergy status to penicillin: Secondary | ICD-10-CM | POA: Diagnosis not present

## 2016-08-23 DIAGNOSIS — N179 Acute kidney failure, unspecified: Secondary | ICD-10-CM | POA: Diagnosis not present

## 2016-08-23 DIAGNOSIS — T502X5A Adverse effect of carbonic-anhydrase inhibitors, benzothiadiazides and other diuretics, initial encounter: Secondary | ICD-10-CM | POA: Diagnosis not present

## 2016-08-23 DIAGNOSIS — E1165 Type 2 diabetes mellitus with hyperglycemia: Secondary | ICD-10-CM | POA: Diagnosis present

## 2016-08-23 DIAGNOSIS — I69359 Hemiplegia and hemiparesis following cerebral infarction affecting unspecified side: Secondary | ICD-10-CM

## 2016-08-23 DIAGNOSIS — Z86711 Personal history of pulmonary embolism: Secondary | ICD-10-CM

## 2016-08-23 DIAGNOSIS — Z87891 Personal history of nicotine dependence: Secondary | ICD-10-CM | POA: Diagnosis not present

## 2016-08-23 DIAGNOSIS — Z7901 Long term (current) use of anticoagulants: Secondary | ICD-10-CM

## 2016-08-23 DIAGNOSIS — L03116 Cellulitis of left lower limb: Principal | ICD-10-CM | POA: Diagnosis present

## 2016-08-23 DIAGNOSIS — E876 Hypokalemia: Secondary | ICD-10-CM | POA: Diagnosis present

## 2016-08-23 DIAGNOSIS — Z9981 Dependence on supplemental oxygen: Secondary | ICD-10-CM

## 2016-08-23 DIAGNOSIS — I5043 Acute on chronic combined systolic (congestive) and diastolic (congestive) heart failure: Secondary | ICD-10-CM | POA: Diagnosis present

## 2016-08-23 DIAGNOSIS — I110 Hypertensive heart disease with heart failure: Secondary | ICD-10-CM | POA: Diagnosis present

## 2016-08-23 DIAGNOSIS — I482 Chronic atrial fibrillation, unspecified: Secondary | ICD-10-CM | POA: Diagnosis present

## 2016-08-23 DIAGNOSIS — Z794 Long term (current) use of insulin: Secondary | ICD-10-CM

## 2016-08-23 DIAGNOSIS — I5033 Acute on chronic diastolic (congestive) heart failure: Secondary | ICD-10-CM | POA: Diagnosis present

## 2016-08-23 DIAGNOSIS — R6510 Systemic inflammatory response syndrome (SIRS) of non-infectious origin without acute organ dysfunction: Secondary | ICD-10-CM | POA: Diagnosis present

## 2016-08-23 DIAGNOSIS — G4733 Obstructive sleep apnea (adult) (pediatric): Secondary | ICD-10-CM | POA: Diagnosis present

## 2016-08-23 DIAGNOSIS — K219 Gastro-esophageal reflux disease without esophagitis: Secondary | ICD-10-CM | POA: Diagnosis present

## 2016-08-23 DIAGNOSIS — Z8249 Family history of ischemic heart disease and other diseases of the circulatory system: Secondary | ICD-10-CM

## 2016-08-23 DIAGNOSIS — E118 Type 2 diabetes mellitus with unspecified complications: Secondary | ICD-10-CM

## 2016-08-23 LAB — COMPREHENSIVE METABOLIC PANEL
ALBUMIN: 3.3 g/dL — AB (ref 3.5–5.0)
ALK PHOS: 47 U/L (ref 38–126)
ALT: 7 U/L — AB (ref 14–54)
ANION GAP: 7 (ref 5–15)
AST: 14 U/L — ABNORMAL LOW (ref 15–41)
BUN: 13 mg/dL (ref 6–20)
CALCIUM: 9.1 mg/dL (ref 8.9–10.3)
CHLORIDE: 97 mmol/L — AB (ref 101–111)
CO2: 34 mmol/L — AB (ref 22–32)
Creatinine, Ser: 1.09 mg/dL — ABNORMAL HIGH (ref 0.44–1.00)
GFR calc non Af Amer: 44 mL/min — ABNORMAL LOW (ref >60)
GFR, EST AFRICAN AMERICAN: 51 mL/min — AB (ref >60)
GLUCOSE: 352 mg/dL — AB (ref 65–99)
Potassium: 4.3 mmol/L (ref 3.5–5.1)
SODIUM: 138 mmol/L (ref 135–145)
Total Bilirubin: 0.6 mg/dL (ref 0.3–1.2)
Total Protein: 6.3 g/dL — ABNORMAL LOW (ref 6.5–8.1)

## 2016-08-23 LAB — PROTIME-INR
INR: 1.07
Prothrombin Time: 14 seconds (ref 11.4–15.2)

## 2016-08-23 LAB — CBC WITH DIFFERENTIAL/PLATELET
BASOS PCT: 0 %
Basophils Absolute: 0 10*3/uL (ref 0.0–0.1)
EOS ABS: 0 10*3/uL (ref 0.0–0.7)
EOS PCT: 0 %
HCT: 35.3 % — ABNORMAL LOW (ref 36.0–46.0)
HEMOGLOBIN: 10.7 g/dL — AB (ref 12.0–15.0)
Lymphocytes Relative: 10 %
Lymphs Abs: 0.7 10*3/uL (ref 0.7–4.0)
MCH: 29.6 pg (ref 26.0–34.0)
MCHC: 30.3 g/dL (ref 30.0–36.0)
MCV: 97.8 fL (ref 78.0–100.0)
Monocytes Absolute: 0.2 10*3/uL (ref 0.1–1.0)
Monocytes Relative: 3 %
NEUTROS PCT: 87 %
Neutro Abs: 6 10*3/uL (ref 1.7–7.7)
PLATELETS: 165 10*3/uL (ref 150–400)
RBC: 3.61 MIL/uL — AB (ref 3.87–5.11)
RDW: 14.3 % (ref 11.5–15.5)
WBC: 7 10*3/uL (ref 4.0–10.5)

## 2016-08-23 LAB — GLUCOSE, CAPILLARY: Glucose-Capillary: 292 mg/dL — ABNORMAL HIGH (ref 65–99)

## 2016-08-23 MED ORDER — ALBUTEROL SULFATE (2.5 MG/3ML) 0.083% IN NEBU: 2.5000 mg | INHALATION_SOLUTION | Freq: Four times a day (QID) | RESPIRATORY_TRACT | Status: DC | PRN

## 2016-08-23 MED ORDER — DOXYCYCLINE HYCLATE 100 MG IV SOLR
100.0000 mg | Freq: Once | INTRAVENOUS | Status: DC
  Administered 2016-08-23: 100 mg via INTRAVENOUS
  Filled 2016-08-23: qty 100

## 2016-08-23 MED ORDER — ONDANSETRON HCL 4 MG PO TABS: 4.0000 mg | ORAL_TABLET | Freq: Four times a day (QID) | ORAL | Status: DC | PRN

## 2016-08-23 MED ORDER — INSULIN ASPART 100 UNIT/ML ~~LOC~~ SOLN
0.0000 [IU] | Freq: Every day | SUBCUTANEOUS | Status: DC
  Administered 2016-08-23: 3 [IU] via SUBCUTANEOUS
  Administered 2016-08-24 – 2016-08-27 (×2): 2 [IU] via SUBCUTANEOUS

## 2016-08-23 MED ORDER — INSULIN ASPART 100 UNIT/ML ~~LOC~~ SOLN
0.0000 [IU] | Freq: Three times a day (TID) | SUBCUTANEOUS | Status: DC
  Administered 2016-08-24: 2 [IU] via SUBCUTANEOUS
  Administered 2016-08-24: 3 [IU] via SUBCUTANEOUS
  Administered 2016-08-24: 2 [IU] via SUBCUTANEOUS
  Administered 2016-08-25: 5 [IU] via SUBCUTANEOUS
  Administered 2016-08-25: 2 [IU] via SUBCUTANEOUS
  Administered 2016-08-26 (×2): 1 [IU] via SUBCUTANEOUS
  Administered 2016-08-26: 2 [IU] via SUBCUTANEOUS
  Administered 2016-08-27 (×2): 3 [IU] via SUBCUTANEOUS
  Administered 2016-08-27 – 2016-08-28 (×2): 5 [IU] via SUBCUTANEOUS

## 2016-08-23 MED ORDER — SODIUM CHLORIDE 0.9 % IV SOLN
2.0000 g | Freq: Two times a day (BID) | INTRAVENOUS | Status: DC
  Administered 2016-08-23 – 2016-08-27 (×8): 2 g via INTRAVENOUS
  Filled 2016-08-23 (×9): qty 2

## 2016-08-23 MED ORDER — HYDROXYZINE HCL 25 MG PO TABS
25.0000 mg | ORAL_TABLET | Freq: Three times a day (TID) | ORAL | Status: DC | PRN
  Administered 2016-08-27: 25 mg via ORAL
  Filled 2016-08-23: qty 1

## 2016-08-23 MED ORDER — NAPHAZOLINE-PHENIRAMINE 0.025-0.3 % OP SOLN: 2.0000 [drp] | Freq: Four times a day (QID) | OPHTHALMIC | Status: DC | PRN

## 2016-08-23 MED ORDER — ONDANSETRON HCL 4 MG/2ML IJ SOLN: 4.0000 mg | Freq: Four times a day (QID) | INTRAMUSCULAR | Status: DC | PRN

## 2016-08-23 MED ORDER — FUROSEMIDE 10 MG/ML IJ SOLN
60.0000 mg | Freq: Two times a day (BID) | INTRAMUSCULAR | Status: DC
  Administered 2016-08-23 – 2016-08-27 (×8): 60 mg via INTRAVENOUS
  Filled 2016-08-23 (×9): qty 6

## 2016-08-23 MED ORDER — INSULIN GLARGINE 100 UNIT/ML ~~LOC~~ SOLN
30.0000 [IU] | Freq: Every day | SUBCUTANEOUS | Status: DC
  Administered 2016-08-23: 30 [IU] via SUBCUTANEOUS
  Filled 2016-08-23 (×2): qty 0.3

## 2016-08-23 MED ORDER — VANCOMYCIN HCL 10 G IV SOLR
1500.0000 mg | INTRAVENOUS | Status: DC
  Administered 2016-08-24 – 2016-08-26 (×3): 1500 mg via INTRAVENOUS
  Filled 2016-08-23 (×4): qty 1500

## 2016-08-23 MED ORDER — ACETAMINOPHEN 325 MG PO TABS: 325.0000 mg | ORAL_TABLET | Freq: Four times a day (QID) | ORAL | Status: DC | PRN

## 2016-08-23 MED ORDER — HYDROCODONE-ACETAMINOPHEN 5-325 MG PO TABS: 1.0000 | ORAL_TABLET | Freq: Four times a day (QID) | ORAL | Status: DC | PRN

## 2016-08-23 MED ORDER — CALCIUM CARBONATE-VITAMIN D 500-200 MG-UNIT PO TABS
1.0000 | ORAL_TABLET | Freq: Two times a day (BID) | ORAL | Status: DC
  Administered 2016-08-23 – 2016-08-28 (×10): 1 via ORAL
  Filled 2016-08-23 (×11): qty 1

## 2016-08-23 MED ORDER — POLYETHYLENE GLYCOL 3350 17 G PO PACK: 17.0000 g | PACK | Freq: Every day | ORAL | Status: DC | PRN

## 2016-08-23 MED ORDER — ROPINIROLE HCL 1 MG PO TABS
2.0000 mg | ORAL_TABLET | Freq: Once | ORAL | Status: AC
  Administered 2016-08-23: 2 mg via ORAL
  Filled 2016-08-23: qty 2

## 2016-08-23 MED ORDER — APIXABAN 5 MG PO TABS
5.0000 mg | ORAL_TABLET | Freq: Two times a day (BID) | ORAL | Status: DC
  Administered 2016-08-23 – 2016-08-28 (×10): 5 mg via ORAL
  Filled 2016-08-23 (×10): qty 1

## 2016-08-23 MED ORDER — SODIUM CHLORIDE 0.9 % IV SOLN
INTRAVENOUS | Status: DC
  Administered 2016-08-23: 17:00:00 via INTRAVENOUS

## 2016-08-23 MED ORDER — FERROUS SULFATE 325 (65 FE) MG PO TABS
325.0000 mg | ORAL_TABLET | Freq: Every day | ORAL | Status: DC
  Administered 2016-08-23 – 2016-08-27 (×5): 325 mg via ORAL
  Filled 2016-08-23 (×6): qty 1

## 2016-08-23 MED ORDER — SODIUM CHLORIDE 0.9 % IV SOLN
2500.0000 mg | Freq: Once | INTRAVENOUS | Status: AC
  Administered 2016-08-23: 2500 mg via INTRAVENOUS
  Filled 2016-08-23: qty 2500

## 2016-08-23 MED ORDER — PANTOPRAZOLE SODIUM 40 MG PO TBEC
40.0000 mg | DELAYED_RELEASE_TABLET | Freq: Every morning | ORAL | Status: DC
  Administered 2016-08-24 – 2016-08-28 (×5): 40 mg via ORAL
  Filled 2016-08-23 (×5): qty 1

## 2016-08-23 MED ORDER — FUROSEMIDE 10 MG/ML IJ SOLN: 60.0000 mg | Freq: Two times a day (BID) | INTRAMUSCULAR | Status: DC

## 2016-08-23 MED ORDER — LEVETIRACETAM 500 MG PO TABS
500.0000 mg | ORAL_TABLET | Freq: Two times a day (BID) | ORAL | Status: DC
  Administered 2016-08-23 – 2016-08-28 (×10): 500 mg via ORAL
  Filled 2016-08-23 (×10): qty 1

## 2016-08-23 MED ORDER — METOPROLOL TARTRATE 12.5 MG HALF TABLET
12.5000 mg | ORAL_TABLET | Freq: Two times a day (BID) | ORAL | Status: DC
  Administered 2016-08-23 – 2016-08-28 (×10): 12.5 mg via ORAL
  Filled 2016-08-23 (×10): qty 1

## 2016-08-23 NOTE — H&P (Signed)
TRH H&P   Patient Demographics:    Elizabeth Pugh, is a 83 y.o. female  MRN: [id number]   DOB - April 25, 1927  Admit Date - 08/23/2016  Outpatient Primary MD for the patient is Philis Fendt, MD    Patient coming from: Home  Chief Complaint  Patient presents with  . Cellulitis      HPI:    [name]  is a 83 y.o. female, History of morbid obesity, history of stroke now bedbound, chronic systolic and diastolic CHF EF 33% on 4 L nasal cannula oxygen, chronic atrial fibrillation on Eliquis, GERD, DM type 2 insulin-dependent, who was recently admitted for left leg cellulitis she improved on IV antibiotics was subsequently placed on doxycycline for a few days. She now comes back after her left leg started to get red and hot sometimes late yesterday afternoon, denies fever chills, no chest abdominal pain. No other subjective complaints.    Review of systems:    In addition to the HPI above,   No Fever-chills, No Headache, No changes with Vision or hearing, No problems swallowing food or Liquids, No Chest pain, Cough or Shortness of Breath, No Abdominal pain, No Nausea or Vommitting, Bowel movements are regular, No Blood in stool or Urine, No dysuria, No new skin rashes or bruises,Except left leg as above No new joints pains-aches,  No new weakness, tingling, numbness in any extremity, No recent weight gain or loss, No polyuria, polydypsia or polyphagia, No significant Mental Stressors.  A full 10 point Review of Systems was done, except as stated above, all other Review of Systems were negative.   With Past History of the following :    Past Medical History:  Diagnosis Date  . Acute delirium  04/12/2011  . Acute kidney injury ([place]) 09/02/2015  . Anemia   . Anxiety   . Arthritis   . Atrial fibrillation ([place])   . Blood transfusion   . Bronchitis   . CHF (congestive heart failure) ([place])   . Diabetes mellitus   . GERD (gastroesophageal reflux disease)   . GI (gastrointestinal bleed)    Due to benign mass  . Hypertension   . Lobar pneumonia due to unspecified organism 09/02/2015  . On home oxygen therapy    "4L; 24/7" (12/11/2015)  . Restless leg syndrome   . Seizures (  [place])   . Stroke [facility])       Past Surgical History:  Procedure Laterality Date  . CATARACT EXTRACTION W/ INTRAOCULAR LENS IMPLANT     "Dr. Gershon Crane; think they did the right one"  . EUS  09/24/2011   Procedure: UPPER ENDOSCOPIC ULTRASOUND (EUS) LINEAR;  Surgeon: Beryle Beams, MD;  Location: [facility] ENDOSCOPY;  Service: Endoscopy;  Laterality: N/A;  . TONSILLECTOMY  1935  . TUBAL LIGATION        Social History:     Social History  Substance Use Topics  . Smoking status: Former Smoker    Packs/day: 1.00    Years: 40.00    Types: Cigarettes    Quit date: 09/21/1985  . Smokeless tobacco: Never Used  . Alcohol use No         Family History :     Family History  Problem Relation Age of Onset  . Aneurysm Mother   . Heart attack Father   . Heart failure Sister   . Asthma Sister   . Kidney failure Sister   . Stroke Sister        Home Medications:   Prior to Admission medications   Medication Sig Start Date End Date Taking? Authorizing Provider  acetaminophen (TYLENOL) 325 MG tablet Take 325-650 mg by mouth every 6 (six) hours as needed (for pain).   Yes Historical Provider, MD  albuterol (PROVENTIL HFA;VENTOLIN HFA) 108 (90 BASE) MCG/ACT inhaler Inhale 1 puff into the lungs every 6 (six) hours as needed for wheezing or shortness of breath.   Yes Historical Provider, MD  albuterol (PROVENTIL) (2.5 MG/3ML) 0.083% nebulizer solution Take 2.5 mg by nebulization every 6 (six) hours as needed for wheezing  or shortness of breath.   Yes Historical Provider, MD  apixaban (ELIQUIS) 5 MG TABS tablet Take 1 tablet (5 mg total) by mouth 2 (two) times daily. 09/27/15  Yes Lavina Hamman, MD  calcium-vitamin D (CALCIUM 500+D) 500-400 MG-UNIT per tablet Take 1 tablet by mouth 2 (two) times daily.   Yes Historical Provider, MD  dextromethorphan-guaiFENesin (MUCINEX DM) 30-600 MG 12hr tablet Take 1 tablet by mouth 2 (two) times daily as needed for cough.    Yes Historical Provider, MD  docusate sodium (COLACE) 100 MG capsule Take 100 mg by mouth daily as needed for mild constipation.   Yes Historical Provider, MD  ferrous sulfate 325 (65 FE) MG tablet Take 325 mg by mouth daily with supper.    Yes Historical Provider, MD  furosemide (LASIX) 40 MG tablet Take 1.5 tablets (60 mg total) by mouth 2 (two) times daily. Patient taking differently: Take 20-40 mg by mouth See admin instructions. 40 mg in the morning and 20 mg in the evening 07/21/16  Yes Verlee Monte, MD  hydrOXYzine (ATARAX/VISTARIL) 25 MG tablet Take 25 mg by mouth every 8 (eight) hours as needed for itching.  05/10/16  Yes Historical Provider, MD  insulin glargine (LANTUS) 100 UNIT/ML injection Inject 0.2 mLs (20 Units total) into the skin at bedtime. Patient taking differently: Inject 30 Units into the skin at bedtime.  06/23/16  Yes Theodis Blaze, MD  insulin lispro (HUMALOG KWIKPEN) 100 UNIT/ML KiwkPen Inject 2-8 Units into the skin 3 (three) times daily before meals. PER SLIDING SCALE (nothing if BGL is 150 or less): 0-150 = 0 units, 151-200 = 2 units, 201-250 = 4 units, 251-300 = 6 units, 301-350 = 8 units, 351-400 = 10 units, 400+ = 12 units and notify  NP/MD   Yes Historical Provider, MD  ipratropium-albuterol (DUONEB) 0.5-2.5 (3) MG/3ML SOLN Take 3 mLs by nebulization every 6 (six) hours as needed (for wheezing or shortness of breath).    Yes Historical Provider, MD  levETIRAcetam (KEPPRA) 500 MG tablet Take 1 tablet (500 mg total) by mouth 2 (two)  times daily. 02/27/12  Yes Monika Salk, MD  metoprolol tartrate (LOPRESSOR) 25 MG tablet Take 0.5 tablets (12.5 mg total) by mouth 2 (two) times daily. 06/23/16  Yes Theodis Blaze, MD  Naphazoline-Pheniramine (ALLERGY EYE OP) Place 1 drop into both eyes every 8 (eight) hours as needed (For eye irritation.).    Yes Historical Provider, MD  pantoprazole (PROTONIX) 40 MG tablet Take 1 tablet (40 mg total) by mouth 2 (two) times daily before a meal. Patient taking differently: Take 40 mg by mouth every morning.  10/01/15  Yes Lavina Hamman, MD  polyethylene glycol (MIRALAX / GLYCOLAX) packet Take 17 g by mouth daily as needed for mild constipation. 06/11/16  Yes Mariel Aloe, MD  potassium chloride SA (K-DUR,KLOR-CON) 20 MEQ tablet Take 1 tablet (20 mEq total) by mouth daily. 07/21/16  Yes Verlee Monte, MD  pregabalin (LYRICA) 75 MG capsule Take 75 mg by mouth at bedtime.    Yes Historical Provider, MD  rOPINIRole (REQUIP) 2 MG tablet Take 1 tablet (2 mg total) by mouth 3 (three) times daily. Patient taking differently: Take 4 mg by mouth 2 (two) times daily.  09/27/15  Yes Lavina Hamman, MD  Vitamin D, Ergocalciferol, (DRISDOL) 50000 units CAPS capsule Take 50,000 Units by mouth every 7 (seven) days.    Yes Historical Provider, MD  VOLTAREN 1 % GEL Apply 1 application topically 4 (four) times daily as needed (For knee pain.).  10/26/11  Yes Historical Provider, MD     Allergies:     Allergies  Allergen Reactions  . Penicillins Rash    Has patient had a PCN reaction causing immediate rash, facial/tongue/throat swelling, SOB or lightheadedness with hypotension: Yes Has patient had a PCN reaction causing severe rash involving mucus membranes or skin necrosis: No Has patient had a PCN reaction that required hospitalization: No Has patient had a PCN reaction occurring within the last 10 years: No If all of the above answers are "NO", then may proceed with Cephalosporin use.  . Erythromycin Itching and  Other (See Comments)    Vaginal itching  . Zithromax [Azithromycin] Other (See Comments)    Caused a yeast infection      Physical Exam:   Vitals  Blood pressure 125/64, pulse 73, temperature 99 F (37.2 C), temperature source Oral, resp. rate 16, SpO2 100 %.   1. General Elderly African-American morbidly obese female lying in bed in NAD,     2. Normal affect and insight, Not Suicidal or Homicidal, Awake Alert, Oriented X 3.  3. No F.N deficits, ALL C.Nerves Intact, Strength 5/5 all 4 extremities, Sensation intact all 4 extremities, Plantars down going.  4. Ears and Eyes appear Normal, Conjunctivae clear, PERRLA. Moist Oral Mucosa.  5. Supple Neck, No JVD, No cervical lymphadenopathy appriciated, No Carotid Bruits.  6. Symmetrical Chest wall movement, Good air movement bilaterally, CTAB.  7. RRR, No Gallops, Rubs or Murmurs, No Parasternal Heave.  8. Positive Bowel Sounds, Abdomen Soft, No tenderness, No organomegaly appriciated,No rebound -guarding or rigidity.  9.  No Cyanosis, Normal Skin Turgor, No Skin Rash or Bruise. 2+ right leg edema, 3+ left leg edema, left leg  is warm and red up to mid thigh  10. Good muscle tone,  joints appear normal , no effusions, Normal ROM.  11. No Palpable Lymph Nodes in Neck or Axillae      Data Review:    CBC  Recent Labs Lab 08/23/16 1635  WBC 7.0  HGB 10.7*  HCT 35.3*  PLT 165  MCV 97.8  MCH 29.6  MCHC 30.3  RDW 14.3  LYMPHSABS 0.7  MONOABS 0.2  EOSABS 0.0  BASOSABS 0.0   ------------------------------------------------------------------------------------------------------------------  Chemistries   Recent Labs Lab 08/23/16 1635  NA 138  K 4.3  CL 97*  CO2 34*  GLUCOSE 352*  BUN 13  CREATININE 1.09*  CALCIUM 9.1  AST 14*  ALT 7*  ALKPHOS 47  BILITOT 0.6   ------------------------------------------------------------------------------------------------------------------ estimated creatinine clearance  is 49.9 mL/min (A) (by C-G formula based on SCr of 1.09 mg/dL (H)). ------------------------------------------------------------------------------------------------------------------ No results for input(s): TSH, T4TOTAL, T3FREE, THYROIDAB in the last 72 hours.  Invalid input(s): FREET3  Coagulation profile  Recent Labs Lab 08/23/16 1635  INR 1.07   ------------------------------------------------------------------------------------------------------------------- No results for input(s): DDIMER in the last 72 hours. -------------------------------------------------------------------------------------------------------------------  Cardiac Enzymes No results for input(s): CKMB, TROPONINI, MYOGLOBIN in the last 168 hours.  Invalid input(s): CK ------------------------------------------------------------------------------------------------------------------    Component Value Date/Time   BNP 186.5 (H) 08/10/2016 1228     ---------------------------------------------------------------------------------------------------------------  Urinalysis    Component Value Date/Time   COLORURINE YELLOW 08/10/2016 1539   APPEARANCEUR CLOUDY (A) 08/10/2016 1539   LABSPEC 1.015 08/10/2016 1539   PHURINE 5.0 08/10/2016 1539   GLUCOSEU NEGATIVE 08/10/2016 1539   HGBUR MODERATE (A) 08/10/2016 1539   BILIRUBINUR NEGATIVE 08/10/2016 1539   KETONESUR NEGATIVE 08/10/2016 1539   PROTEINUR 30 (A) 08/10/2016 1539   UROBILINOGEN 1.0 01/19/2015 2241   NITRITE NEGATIVE 08/10/2016 1539   LEUKOCYTESUR NEGATIVE 08/10/2016 1539    ----------------------------------------------------------------------------------------------------------------   Imaging Results:    No results found.      Assessment & Plan:     1. Recurrent left leg cellulitis. Replace on IV vancomycin and meropenem, currently afebrile without leukocytosis hence no cultures, most of her cellulitis I think is caused by massive  edema. We'll diurese her as well and monitor.  2. Acute on chronic combined systolic diastolic CHF EF 58-09%. Continue beta blocker, IV Lasix, fluids and salt RESTRICTION, daily weight, monitor BMP. Tinea 40 to nasal cannula oxygen.  3. GERD. On PPI continue.  4. Morbid obesity with obstructive sleep apnea. Follow with PCP for weight loss, night time CPAP.  5. Chronic atrial fibrillation, history of PE. [place] vasc 2 score of at least 4. Continue Lopressor along with Eliquis.  6. History of stroke. Supportive skin no acute issues she is now bedbound.   7.DM type II. Continue Lantus along with sliding scale check A1c.     DVT Prophylaxis Eliquis  AM Labs Ordered, also please review Full Orders  Family Communication: Admission, patients condition and plan of care including tests being ordered have been discussed with the patient and daughters who indicate understanding and agree with the plan and Code Status.  Code Status Full  Likely DC to  Home 2-3 days  Condition GUARDED    Consults called: None    Admission status: Inpt    Time spent in minutes : 35   Lala Lund M.D on 08/23/2016 at 5:44 PM  Between 7am to 7pm - Pager - [phone] ( page via [facility], text pages only, please mention full 10 digit call  back number).  After 7pm go to [email or web address] - password [facility]  [facility] - Office  [phone]

## 2016-08-23 NOTE — ED Triage Notes (Signed)
To hallway bed via EMS.  Pt was hospitalized several weeks ago for cellulitis on left leg, antibiotic therapy is completed.  Pt on 3L of oxygen via , O2 sats 98%, EMS put pt on 4L.  Pt is bed bound, family will use hoyer lift to transfer pt from bed to chair.

## 2016-08-23 NOTE — ED Notes (Signed)
Pt. IV would not pull back blood. Will notify someone else to draw labs.

## 2016-08-23 NOTE — ED Provider Notes (Signed)
[facility] DEPT Provider Note   CSN: [id number] Arrival date & time: 08/23/16  1440     History   Chief Complaint Chief Complaint  Patient presents with  . Cellulitis    HPI Elizabeth Pugh is a 83 y.o. female.  Patient with recent admission to the hospital. Was discharged April 13. Patient was admitted for sepsis. Also had leg cellulitis. Discharged home on doxycycline completed that course on Tuesday. Today the left leg became reinfected with lots of redness and movement of the redness into the thigh. Questionable fever at home. Patient has a home nurse. They also were concerned about the leg. Patient returns here for reevaluation of possible recurrent cellulitis of the left leg.      Past Medical History:  Diagnosis Date  . Acute delirium 04/12/2011  . Acute kidney injury ([place]) 09/02/2015  . Anemia   . Anxiety   . Arthritis   . Atrial fibrillation ([place])   . Blood transfusion   . Bronchitis   . CHF (congestive heart failure) ([place])   . Diabetes mellitus   . GERD (gastroesophageal reflux disease)   . GI (gastrointestinal bleed)    Due to benign mass  . Hypertension   . Lobar pneumonia due to unspecified organism 09/02/2015  . On home oxygen therapy    "4L; 24/7" (12/11/2015)  . Restless leg syndrome   . Seizures ([place])   . Stroke [facility])     Patient Active Problem List   Diagnosis Date Noted  . Cellulitis of left leg 08/23/2016  . Sepsis ([place]) 08/10/2016  . Morbid obesity with BMI of 45.0-49.9, adult ([place]) 08/10/2016  . Cellulitis of left lower extremity 08/10/2016  . Acute respiratory failure with hypercapnia ([place]) 07/13/2016  . Acute respiratory failure with hypoxia and hypercapnia ([place]) 06/16/2016  . Urinary tract infection without hematuria 06/16/2016  . HCAP (healthcare-associated pneumonia) 06/16/2016  . Acute respiratory failure ([place]) 06/16/2016  . CAP (community acquired pneumonia) 06/09/2016  . Cellulitis 12/16/2015  . Confusion 12/11/2015  . SIRS  (systemic inflammatory response syndrome) ([place]) 12/11/2015  . Chronic respiratory failure with hypoxia and hypercapnia ([place]) 09/28/2015  . Acute encephalopathy   . Acute on chronic congestive heart failure ([place])   . Needs sleep apnea assessment   . Acute on chronic diastolic (congestive) heart failure ([place]) 09/22/2015  . Acute kidney injury ([place]) 09/02/2015  . Lobar pneumonia due to unspecified organism 09/02/2015  . Bilateral leg edema   . Acute on chronic respiratory failure with hypoxia and hypercapnia ([place]) 08/29/2015  . History of pulmonary embolism 01/27/2015  . Chest pain at rest 01/27/2015  . Shortness of breath at rest 01/27/2015  . Chronic combined systolic and diastolic heart failure ([place]) 01/27/2015  . Thrombocytopenia ([place]) 01/27/2015  . Abnormal findings on radiological examination of gastrointestinal tract 01/27/2015  . Chronic atrial fibrillation ([place])   . Seizure disorder ([place])   . Anxiety   . Anemia   . Restless leg syndrome   . Acute delirium 04/12/2011  . DM (diabetes mellitus), type 2, uncontrolled with complications ([place]) 10/25/7626  . CVA, old, hemiparesis ([place]) 03/25/2011  . Hypertension 03/25/2011    Past Surgical History:  Procedure Laterality Date  . CATARACT EXTRACTION W/ INTRAOCULAR LENS IMPLANT     "Dr. Gershon Crane; think they did the right one"  . EUS  09/24/2011   Procedure: UPPER ENDOSCOPIC ULTRASOUND (EUS) LINEAR;  Surgeon: Beryle Beams, MD;  Location: [facility] ENDOSCOPY;  Service: Endoscopy;  Laterality: N/A;  . TONSILLECTOMY  1935  . TUBAL LIGATION      OB History    No data available       Home Medications    Prior to Admission medications   Medication Sig Start Date End Date Taking? Authorizing Provider  acetaminophen (TYLENOL) 325 MG tablet Take 325-650 mg by mouth every 6 (six) hours as needed (for pain).   Yes Historical Provider, MD  albuterol (PROVENTIL HFA;VENTOLIN HFA) 108 (90 BASE) MCG/ACT inhaler Inhale 1 puff into the lungs every  6 (six) hours as needed for wheezing or shortness of breath.   Yes Historical Provider, MD  albuterol (PROVENTIL) (2.5 MG/3ML) 0.083% nebulizer solution Take 2.5 mg by nebulization every 6 (six) hours as needed for wheezing or shortness of breath.   Yes Historical Provider, MD  apixaban (ELIQUIS) 5 MG TABS tablet Take 1 tablet (5 mg total) by mouth 2 (two) times daily. 09/27/15  Yes Lavina Hamman, MD  calcium-vitamin D (CALCIUM 500+D) 500-400 MG-UNIT per tablet Take 1 tablet by mouth 2 (two) times daily.   Yes Historical Provider, MD  dextromethorphan-guaiFENesin (MUCINEX DM) 30-600 MG 12hr tablet Take 1 tablet by mouth 2 (two) times daily as needed for cough.    Yes Historical Provider, MD  docusate sodium (COLACE) 100 MG capsule Take 100 mg by mouth daily as needed for mild constipation.   Yes Historical Provider, MD  ferrous sulfate 325 (65 FE) MG tablet Take 325 mg by mouth daily with supper.    Yes Historical Provider, MD  furosemide (LASIX) 40 MG tablet Take 1.5 tablets (60 mg total) by mouth 2 (two) times daily. Patient taking differently: Take 20-40 mg by mouth See admin instructions. 40 mg in the morning and 20 mg in the evening 07/21/16  Yes Verlee Monte, MD  hydrOXYzine (ATARAX/VISTARIL) 25 MG tablet Take 25 mg by mouth every 8 (eight) hours as needed for itching.  05/10/16  Yes Historical Provider, MD  insulin glargine (LANTUS) 100 UNIT/ML injection Inject 0.2 mLs (20 Units total) into the skin at bedtime. Patient taking differently: Inject 30 Units into the skin at bedtime.  06/23/16  Yes Theodis Blaze, MD  insulin lispro (HUMALOG KWIKPEN) 100 UNIT/ML KiwkPen Inject 2-8 Units into the skin 3 (three) times daily before meals. PER SLIDING SCALE (nothing if BGL is 150 or less): 0-150 = 0 units, 151-200 = 2 units, 201-250 = 4 units, 251-300 = 6 units, 301-350 = 8 units, 351-400 = 10 units, 400+ = 12 units and notify NP/MD   Yes Historical Provider, MD  ipratropium-albuterol (DUONEB) 0.5-2.5 (3)  MG/3ML SOLN Take 3 mLs by nebulization every 6 (six) hours as needed (for wheezing or shortness of breath).    Yes Historical Provider, MD  levETIRAcetam (KEPPRA) 500 MG tablet Take 1 tablet (500 mg total) by mouth 2 (two) times daily. 02/27/12  Yes Monika Salk, MD  metoprolol tartrate (LOPRESSOR) 25 MG tablet Take 0.5 tablets (12.5 mg total) by mouth 2 (two) times daily. 06/23/16  Yes Theodis Blaze, MD  Naphazoline-Pheniramine (ALLERGY EYE OP) Place 1 drop into both eyes every 8 (eight) hours as needed (For eye irritation.).    Yes Historical Provider, MD  pantoprazole (PROTONIX) 40 MG tablet Take 1 tablet (40 mg total) by mouth 2 (two) times daily before a meal. Patient taking differently: Take 40 mg by mouth every morning.  10/01/15  Yes Lavina Hamman, MD  polyethylene glycol (MIRALAX / GLYCOLAX) packet Take 17 g by mouth daily as needed  for mild constipation. 06/11/16  Yes Mariel Aloe, MD  potassium chloride SA (K-DUR,KLOR-CON) 20 MEQ tablet Take 1 tablet (20 mEq total) by mouth daily. 07/21/16  Yes Verlee Monte, MD  pregabalin (LYRICA) 75 MG capsule Take 75 mg by mouth at bedtime.    Yes Historical Provider, MD  rOPINIRole (REQUIP) 2 MG tablet Take 1 tablet (2 mg total) by mouth 3 (three) times daily. Patient taking differently: Take 4 mg by mouth 2 (two) times daily.  09/27/15  Yes Lavina Hamman, MD  Vitamin D, Ergocalciferol, (DRISDOL) 50000 units CAPS capsule Take 50,000 Units by mouth every 7 (seven) days.    Yes Historical Provider, MD  VOLTAREN 1 % GEL Apply 1 application topically 4 (four) times daily as needed (For knee pain.).  10/26/11  Yes Historical Provider, MD    Family History Family History  Problem Relation Age of Onset  . Aneurysm Mother   . Heart attack Father   . Heart failure Sister   . Asthma Sister   . Kidney failure Sister   . Stroke Sister     Social History Social History  Substance Use Topics  . Smoking status: Former Smoker    Packs/day: 1.00    Years:  40.00    Types: Cigarettes    Quit date: 09/21/1985  . Smokeless tobacco: Never Used  . Alcohol use No     Allergies   Penicillins; Erythromycin; and Zithromax [azithromycin]   Review of Systems Review of Systems  Constitutional: Negative for fever.  HENT: Negative for congestion.   Eyes: Negative for redness.  Respiratory: Negative for shortness of breath.   Cardiovascular: Positive for leg swelling. Negative for chest pain.  Gastrointestinal: Negative for abdominal pain.  Skin: Negative for rash and wound.  Neurological: Negative for headaches.  Hematological: Does not bruise/bleed easily.  Psychiatric/Behavioral: Negative for confusion.     Physical Exam Updated Vital Signs BP 125/64 (BP Location: Right Arm)   Pulse 73   Temp 99 F (37.2 C) (Oral)   Resp 16   SpO2 100%   Physical Exam  Constitutional: She appears well-developed. No distress.  HENT:  Head: Normocephalic and atraumatic.  Eyes: EOM are normal. Pupils are equal, round, and reactive to light.  Neck: Neck supple.  Cardiovascular: Normal rate, regular rhythm and normal heart sounds.   Pulmonary/Chest: Effort normal and breath sounds normal. No respiratory distress.  Abdominal: Soft. Bowel sounds are normal. There is no tenderness.  Musculoskeletal: Normal range of motion. She exhibits edema. She exhibits no tenderness.  Bilateral lower extremity erythema and edema. Right leg with baseline erythema. Left leg with very intense erythema moving all way up into the thigh. Increased warmth.  Neurological: She is alert. No cranial nerve deficit or sensory deficit. She exhibits normal muscle tone.  Nursing note and vitals reviewed.    ED Treatments / Results  Labs (all labs ordered are listed, but only abnormal results are displayed) Labs Reviewed  CBC WITH DIFFERENTIAL/PLATELET - Abnormal; Notable for the following:       Result Value   RBC 3.61 (*)    Hemoglobin 10.7 (*)    HCT 35.3 (*)    All other  components within normal limits  COMPREHENSIVE METABOLIC PANEL - Abnormal; Notable for the following:    Chloride 97 (*)    CO2 34 (*)    Glucose, Bld 352 (*)    Creatinine, Ser 1.09 (*)    Total Protein 6.3 (*)    Albumin  3.3 (*)    AST 14 (*)    ALT 7 (*)    GFR calc non Af Amer 44 (*)    GFR calc Af Amer 51 (*)    All other components within normal limits  PROTIME-INR    EKG  EKG Interpretation None       Radiology No results found.  Procedures Procedures (including critical care time)  Medications Ordered in ED Medications  furosemide (LASIX) injection 60 mg (not administered)  acetaminophen (TYLENOL) tablet 325-650 mg (not administered)  albuterol (PROVENTIL) (2.5 MG/3ML) 0.083% nebulizer solution 2.5 mg (not administered)  calcium-vitamin D (OSCAL-500) 500-400 MG-UNIT per tablet 1 tablet (not administered)  ferrous sulfate tablet 325 mg (not administered)  hydrOXYzine (ATARAX/VISTARIL) tablet 25 mg (not administered)  insulin glargine (LANTUS) injection 30 Units (not administered)  levETIRAcetam (KEPPRA) tablet 500 mg (not administered)  metoprolol tartrate (LOPRESSOR) tablet 12.5 mg (not administered)  polyethylene glycol (MIRALAX / GLYCOLAX) packet 17 g (not administered)  pantoprazole (PROTONIX) EC tablet 40 mg (not administered)  naphazoline-pheniramine (NAPHCON-A) 0.025-0.3 % ophthalmic solution 2 drop (not administered)  meropenem (MERREM) 2 g in sodium chloride 0.9 % 100 mL IVPB (not administered)  vancomycin (VANCOCIN) 2,500 mg in sodium chloride 0.9 % 500 mL IVPB (not administered)  vancomycin (VANCOCIN) 1,500 mg in sodium chloride 0.9 % 500 mL IVPB (not administered)     Initial Impression / Assessment and Plan / ED Course  I have reviewed the triage vital signs and the nursing notes.  Pertinent labs & imaging results that were available during my care of the patient were reviewed by me and considered in my medical decision making (see chart for  details).     Patient with acute flare to the left leg cellulitis. Stopped her antibiotic course of doxycycline on Tuesday. Patient with recent admission for sepsis. Associated with cellulitis. Patient is discharged from hospital on April 13.  Family states she did get better the legs were improving she does have some chronic redness due to edema. But then today there was marked redness quickly moving up into the thigh of the left leg. Patient started here on some IV antibiotics. Patient currently nontoxic no acute distress. However feel that she needs at least overnight antibiotics and mixtures improve and not get worse  Hospitalist all patient and will admit.  Final Clinical Impressions(s) / ED Diagnoses   Final diagnoses:  Cellulitis of left lower extremity    New Prescriptions New Prescriptions   No medications on file     Fredia Sorrow, MD 08/23/16 1756

## 2016-08-23 NOTE — Progress Notes (Signed)
Pharmacy Antibiotic Note  Elizabeth Pugh is a 83 y.o. female admitted on 08/23/2016 with cellulitis.  Pharmacy has been consulted for vancomycin/meropenem dosing. Noted, pt recently hospitalized and completed antibiotic course for cellulitis. Afebrile, WBC wnl. SCr 1.09 on admit, normalized CrCl~40. Noted rash allergy to PCN but tolerates cephalosporins.  Plan: Meropenem 2g IV q12h Vancomycin 2500mg  IV x 1; then 1500mg  IV q24h Monitor clinical progress, c/s, renal function F/u de-escalation plan/LOT, vancomycin trough as indicated     Temp (24hrs), Avg:99 F (37.2 C), Min:99 F (37.2 C), Max:99 F (37.2 C)   Recent Labs Lab 08/23/16 1635  WBC 7.0  CREATININE 1.09*    Estimated Creatinine Clearance: 49.9 mL/min (A) (by C-G formula based on SCr of 1.09 mg/dL (H)).    Allergies  Allergen Reactions  . Penicillins Rash    Has patient had a PCN reaction causing immediate rash, facial/tongue/throat swelling, SOB or lightheadedness with hypotension: Yes Has patient had a PCN reaction causing severe rash involving mucus membranes or skin necrosis: No Has patient had a PCN reaction that required hospitalization: No Has patient had a PCN reaction occurring within the last 10 years: No If all of the above answers are "NO", then may proceed with Cephalosporin use.  . Erythromycin Itching and Other (See Comments)    Vaginal itching  . Zithromax [Azithromycin] Other (See Comments)    Caused a yeast infection     Elicia Lamp, PharmD, BCPS Clinical Pharmacist 08/23/2016 5:45 PM

## 2016-08-23 NOTE — Progress Notes (Signed)
Elizabeth Pugh is a 83 y.o. female patient admitted from ED awake, alert - oriented  X 1-2 - no acute distress noted.  VSS - Blood pressure 129/74, pulse 74, temperature 98.9 F (37.2 C), temperature source Oral, resp. rate 18, SpO2 96 %.    IV in place, occlusive dsg intact without redness, NS infusing at 50 cc hour..  Orientation to room, and floor completed with information packet given to patient/family.  Patient/family declined safety video at this time.  Admission INP armband ID verified with patient/family, and in place.  SR up x 2, fall assessment complete, patient is not able, but family able to verbalize understanding of risk associated with falls, and verbalized understanding to call nsg before up out of bed.  Call light within reach, patient questionable on ablility to voice, and demonstrate understanding.  Skin, MASD noted, in folds between groin and panis, Left lower extremity is red and weeping, Buttocks is a purple color, resembles deep tissue trauma, alligator skin noted on left lower extremity by patients groin. Patients weight is 290.8 lbs is bed bound and at risk for skin breakdown.     Will cont to eval and treat per MD orders.  Milas Hock, RN 08/23/2016 7:30 PM

## 2016-08-24 LAB — CBC
HEMATOCRIT: 35.3 % — AB (ref 36.0–46.0)
Hemoglobin: 10.4 g/dL — ABNORMAL LOW (ref 12.0–15.0)
MCH: 28.7 pg (ref 26.0–34.0)
MCHC: 29.5 g/dL — ABNORMAL LOW (ref 30.0–36.0)
MCV: 97.5 fL (ref 78.0–100.0)
Platelets: 160 10*3/uL (ref 150–400)
RBC: 3.62 MIL/uL — AB (ref 3.87–5.11)
RDW: 14.3 % (ref 11.5–15.5)
WBC: 4.3 10*3/uL (ref 4.0–10.5)

## 2016-08-24 LAB — BASIC METABOLIC PANEL
Anion gap: 8 (ref 5–15)
BUN: 13 mg/dL (ref 6–20)
CHLORIDE: 98 mmol/L — AB (ref 101–111)
CO2: 35 mmol/L — ABNORMAL HIGH (ref 22–32)
Calcium: 9.1 mg/dL (ref 8.9–10.3)
Creatinine, Ser: 0.94 mg/dL (ref 0.44–1.00)
GFR calc non Af Amer: 53 mL/min — ABNORMAL LOW (ref >60)
Glucose, Bld: 213 mg/dL — ABNORMAL HIGH (ref 65–99)
POTASSIUM: 4 mmol/L (ref 3.5–5.1)
SODIUM: 141 mmol/L (ref 135–145)

## 2016-08-24 LAB — GLUCOSE, CAPILLARY
GLUCOSE-CAPILLARY: 178 mg/dL — AB (ref 65–99)
GLUCOSE-CAPILLARY: 198 mg/dL — AB (ref 65–99)
GLUCOSE-CAPILLARY: 231 mg/dL — AB (ref 65–99)
Glucose-Capillary: 208 mg/dL — ABNORMAL HIGH (ref 65–99)

## 2016-08-24 LAB — HEMOGLOBIN A1C
Hgb A1c MFr Bld: 8.7 % — ABNORMAL HIGH (ref 4.8–5.6)
Mean Plasma Glucose: 203 mg/dL

## 2016-08-24 MED ORDER — ORAL CARE MOUTH RINSE
15.0000 mL | Freq: Two times a day (BID) | OROMUCOSAL | Status: DC
  Administered 2016-08-24 – 2016-08-28 (×8): 15 mL via OROMUCOSAL

## 2016-08-24 MED ORDER — METOLAZONE 2.5 MG PO TABS
2.5000 mg | ORAL_TABLET | Freq: Every day | ORAL | Status: AC
  Administered 2016-08-24 – 2016-08-25 (×2): 2.5 mg via ORAL
  Filled 2016-08-24 (×2): qty 1

## 2016-08-24 MED ORDER — INSULIN ASPART 100 UNIT/ML ~~LOC~~ SOLN
3.0000 [IU] | Freq: Three times a day (TID) | SUBCUTANEOUS | Status: DC
  Administered 2016-08-24 – 2016-08-28 (×12): 3 [IU] via SUBCUTANEOUS

## 2016-08-24 MED ORDER — INSULIN GLARGINE 100 UNIT/ML ~~LOC~~ SOLN
35.0000 [IU] | Freq: Every day | SUBCUTANEOUS | Status: DC
  Administered 2016-08-24: 35 [IU] via SUBCUTANEOUS
  Filled 2016-08-24: qty 0.35

## 2016-08-24 NOTE — Progress Notes (Signed)
Orthopedic Tech Progress Note Patient Details:  Elizabeth Pugh July 09, 1926 [id number]  Ortho Devices Type of Ortho Device: Louretta Parma boot Ortho Device/Splint Location: bilateral Ortho Device/Splint Interventions: Application   Elizabeth Pugh 08/24/2016, 1:14 PM

## 2016-08-24 NOTE — Progress Notes (Signed)
PROGRESS NOTE                                                                                                                                                                                                             Patient Demographics:    Elizabeth Pugh, is a 83 y.o. female, DOB - [birth year], IRJ:[id number]  Admit date - 08/23/2016   Admitting Physician Thurnell Lose, MD  Outpatient Primary MD for the patient is Philis Fendt, MD  LOS - 1  Chief Complaint  Patient presents with  . Cellulitis       Brief Narrative   Elizabeth Pugh  is a 83 y.o. female, History of morbid obesity, history of stroke now bedbound, chronic systolic and diastolic CHF EF 30% on 4 L nasal cannula oxygen, chronic atrial fibrillation on Eliquis, GERD, DM type 2 insulin-dependent, she was admitted for left leg cellulitis reoccurrence along with acute on chronic combined systolic and diastolic CHF.   Subjective:    Elizabeth Pugh today has, No headache, No chest pain, No abdominal pain - No Nausea, No new weakness tingling or numbness, No Cough - SOB.     Assessment  & Plan :     1. Recurrent left leg cellulitis. Replace on IV vancomycin and meropenem, Remains afebrile and nonseptic, continue antibiotic diuresis to reduce edema.  2. Acute on chronic combined systolic diastolic CHF EF 16-01%. Continue beta blocker, continue diuresis with IV Lasix and Zaroxolyn, placed on salt and fluid restriction, monitor weight, BMP, she is on 4 L nasal cannula oxygen at baseline which will be continued as well along with breathing treatments if needed.  3. GERD. Remains on PPI.  4. Morbid obesity with obstructive sleep apnea. Daily at bedtime CPAP and follow with PCP for weight loss.  5. Chronic atrial fibrillation, history of PE. [place] vasc 2 score of at least 4. Continue Lopressor along with Eliquis.  6. History of stroke. Supportive skin no  acute issues she is now bedbound.   7.DM type II. On Lantus and sliding scale, have adjusted Lantus dose and added pre-meal NovoLog for better control  Lab Results  Component Value Date   HGBA1C 9.4 (H) 06/09/2016   CBG (last 3)   Recent Labs  08/23/16 2131 08/24/16 0752  GLUCAP 292* 178*      Diet :  Diet heart healthy/carb modified Room service appropriate? Yes; Fluid consistency: Thin    Family Communication  :  daughters  Code Status :  Full  Disposition Plan  :  HHPT  Consults  :  None  Procedures  :  None  DVT Prophylaxis  :  Eliquis  Lab Results  Component Value Date   PLT 160 08/24/2016    Inpatient Medications  Scheduled Meds: . apixaban  5 mg Oral BID  . calcium-vitamin D  1 tablet Oral BID  . ferrous sulfate  325 mg Oral Q supper  . furosemide  60 mg Intravenous BID  . insulin aspart  0-5 Units Subcutaneous QHS  . insulin aspart  0-9 Units Subcutaneous TID WC  . insulin glargine  30 Units Subcutaneous QHS  . levETIRAcetam  500 mg Oral BID  . mouth rinse  15 mL Mouth Rinse BID  . metoprolol tartrate  12.5 mg Oral BID  . pantoprazole  40 mg Oral q morning - 10a   Continuous Infusions: . meropenem (MERREM) IV 2 g (08/24/16 0952)  . vancomycin     PRN Meds:.acetaminophen, albuterol, HYDROcodone-acetaminophen, hydrOXYzine, naphazoline-pheniramine, ondansetron **OR** ondansetron (ZOFRAN) IV, polyethylene glycol  Antibiotics  :    Anti-infectives    Start     Dose/Rate Route Frequency Ordered Stop   08/24/16 1830  vancomycin (VANCOCIN) 1,500 mg in sodium chloride 0.9 % 500 mL IVPB     1,500 mg 250 mL/hr over 120 Minutes Intravenous Every 24 hours 08/23/16 1750     08/23/16 2200  meropenem (MERREM) 2 g in sodium chloride 0.9 % 100 mL IVPB     2 g 200 mL/hr over 30 Minutes Intravenous Every 12 hours 08/23/16 1750     08/23/16 1800  vancomycin (VANCOCIN) 2,500 mg in sodium chloride 0.9 % 500 mL IVPB     2,500 mg 250 mL/hr over 120 Minutes  Intravenous  Once 08/23/16 1750 08/23/16 2242   08/23/16 1630  doxycycline (VIBRAMYCIN) 100 mg in dextrose 5 % 250 mL IVPB  Status:  Discontinued     100 mg 125 mL/hr over 120 Minutes Intravenous  Once 08/23/16 1548 08/23/16 1830         Objective:   Vitals:   08/23/16 2134 08/23/16 2349 08/24/16 0449 08/24/16 0942  BP: (!) 129/52  100/70 117/63  Pulse: 98 88 86 86  Resp: 18 18 18 20   Temp: 98 F (36.7 C)  98.7 F (37.1 C) 98.8 F (37.1 C)  TempSrc: Oral  Oral   SpO2: 100% 98% 100% 100%  Weight:      Height:        Wt Readings from Last 3 Encounters:  08/23/16 131.8 kg (290 lb 9.6 oz)  08/13/16 132.5 kg (292 lb 3.2 oz)  08/05/16 127 kg (280 lb)     Intake/Output Summary (Last 24 hours) at 08/24/16 1026 Last data filed at 08/24/16 1000  Gross per 24 hour  Intake           680.83 ml  Output                0 ml  Net           680.83 ml     Physical Exam  Awake Alert, Oriented X 3, No new F.N deficits, Normal affect [place].AT,PERRAL Supple Neck,No JVD, No cervical lymphadenopathy appriciated.  Symmetrical Chest wall movement, Good air movement bilaterally, CTAB RRR,No Gallops,Rubs or new Murmurs, No Parasternal Heave +ve B.Sounds, Abd Soft,  No tenderness, No organomegaly appriciated, No rebound - guarding or rigidity. 2+ right leg and 3+ left leg edema, left leg cellulitis improved    Data Review:    CBC  Recent Labs Lab 08/23/16 1635 08/24/16 0724  WBC 7.0 4.3  HGB 10.7* 10.4*  HCT 35.3* 35.3*  PLT 165 160  MCV 97.8 97.5  MCH 29.6 28.7  MCHC 30.3 29.5*  RDW 14.3 14.3  LYMPHSABS 0.7  --   MONOABS 0.2  --   EOSABS 0.0  --   BASOSABS 0.0  --     Chemistries   Recent Labs Lab 08/23/16 1635 08/24/16 0724  NA 138 141  K 4.3 4.0  CL 97* 98*  CO2 34* 35*  GLUCOSE 352* 213*  BUN 13 13  CREATININE 1.09* 0.94  CALCIUM 9.1 9.1  AST 14*  --   ALT 7*  --   ALKPHOS 47  --   BILITOT 0.6  --     ------------------------------------------------------------------------------------------------------------------ No results for input(s): CHOL, HDL, LDLCALC, TRIG, CHOLHDL, LDLDIRECT in the last 72 hours.  Lab Results  Component Value Date   HGBA1C 9.4 (H) 06/09/2016   ------------------------------------------------------------------------------------------------------------------ No results for input(s): TSH, T4TOTAL, T3FREE, THYROIDAB in the last 72 hours.  Invalid input(s): FREET3 ------------------------------------------------------------------------------------------------------------------ No results for input(s): VITAMINB12, FOLATE, FERRITIN, TIBC, IRON, RETICCTPCT in the last 72 hours.  Coagulation profile  Recent Labs Lab 08/23/16 1635  INR 1.07    No results for input(s): DDIMER in the last 72 hours.  Cardiac Enzymes No results for input(s): CKMB, TROPONINI, MYOGLOBIN in the last 168 hours.  Invalid input(s): CK ------------------------------------------------------------------------------------------------------------------    Component Value Date/Time   BNP 186.5 (H) 08/10/2016 1228    Micro Results No results found for this or any previous visit (from the past 240 hour(s)).  Radiology Reports Portable Chest 1 View  Result Date: 08/11/2016 CLINICAL DATA:  Acute respiratory failure with hypoxia EXAM: PORTABLE CHEST 1 VIEW COMPARISON:  08/10/2016 FINDINGS: Cardiomegaly with vascular congestion. Low lung volumes. Small right pleural effusion again noted. No confluent opacities or edema. IMPRESSION: Cardiomegaly with vascular congestion.  Stable small right effusion. Electronically Signed   By: Rolm Baptise M.D.   On: 08/11/2016 07:18   Dg Chest Portable 1 View  Result Date: 08/10/2016 CLINICAL DATA:  Shortness of breath and cellulitis EXAM: PORTABLE CHEST 1 VIEW COMPARISON:  08/10/2016 FINDINGS: Cardiac shadow remains enlarged. The lungs are well aerated  bilaterally. Some blunting of the right costophrenic angle consistent with small effusion on the right is noted better visualize from the prior exam. No new focal infiltrate is seen. IMPRESSION: Small right effusion.  No other new focal abnormality is seen. Electronically Signed   By: Inez Catalina M.D.   On: 08/10/2016 19:22   Dg Chest Port 1 View  Result Date: 08/10/2016 CLINICAL DATA:  Tachycardia and lethargy.  Fever. EXAM: PORTABLE CHEST 1 VIEW COMPARISON:  July 13, 2016 FINDINGS: There is no edema or consolidation. Heart is enlarged with pulmonary vascularity within normal limits. No adenopathy. There is atherosclerotic calcification in the aorta. There is evidence of old trauma involving the left clavicle. IMPRESSION: Cardiomegaly. No edema or consolidation. There is aortic atherosclerosis. Electronically Signed   By: Lowella Grip III M.D.   On: 08/10/2016 12:33    Time Spent in minutes  30   Lala Lund M.D on 08/24/2016 at 10:26 AM  Between 7am to 7pm - Pager - [phone] ( page via [facility], text pages only, please mention full  10 digit call back number). After 7pm go to [email or web address] - password [facility]

## 2016-08-24 NOTE — Consult Note (Signed)
[facility] Nurse wound consult note Reason for Consult: LLE cellulitis Patient was noted to previously be admitted for same. She has chronic LE edema and most likely venous stasis based on the skin changes of the bilateral LE.  No open wounds, some dependent blistering noted on the left inner upper thigh.  Patient was noted to have documented "purple" on the bottom, so I did roll the patient and she has skin changes consistent with her skin tone, I do not feel this is related to pressure.  She is noted to be incontinent of urine and has had some issue with MASD in the past, but today her skin on her buttocks looks good I also assessed for ITD, under the breast and pannus, this skin is intact and not irritated at the time of my assessment today Wound type:LLE cellulitis with bilateral LE edema Pressure Injury POA: No No topical care needed, ABIs from 06/11/16 are normal. She could benefit from compression with multilayer compression, however until the cellulitis has resolved would not place.  Discussed POC with patient and bedside nurse.  Re consult if needed, will not follow at this time. Thanks  Braeson Rupe [employer], RN,CWOCN, CNS [phone])

## 2016-08-25 LAB — BASIC METABOLIC PANEL
ANION GAP: 9 (ref 5–15)
BUN: 15 mg/dL (ref 6–20)
CHLORIDE: 93 mmol/L — AB (ref 101–111)
CO2: 39 mmol/L — ABNORMAL HIGH (ref 22–32)
Calcium: 9.1 mg/dL (ref 8.9–10.3)
Creatinine, Ser: 1.06 mg/dL — ABNORMAL HIGH (ref 0.44–1.00)
GFR calc Af Amer: 53 mL/min — ABNORMAL LOW (ref >60)
GFR calc non Af Amer: 45 mL/min — ABNORMAL LOW (ref >60)
Glucose, Bld: 155 mg/dL — ABNORMAL HIGH (ref 65–99)
Potassium: 3.6 mmol/L (ref 3.5–5.1)
SODIUM: 141 mmol/L (ref 135–145)

## 2016-08-25 LAB — GLUCOSE, CAPILLARY
GLUCOSE-CAPILLARY: 182 mg/dL — AB (ref 65–99)
GLUCOSE-CAPILLARY: 268 mg/dL — AB (ref 65–99)
GLUCOSE-CAPILLARY: 170 mg/dL — AB (ref 65–99)
Glucose-Capillary: 106 mg/dL — ABNORMAL HIGH (ref 65–99)

## 2016-08-25 LAB — MAGNESIUM: MAGNESIUM: 1.6 mg/dL — AB (ref 1.7–2.4)

## 2016-08-25 MED ORDER — MAGNESIUM SULFATE 2 GM/50ML IV SOLN
2.0000 g | Freq: Once | INTRAVENOUS | Status: AC
  Administered 2016-08-25: 2 g via INTRAVENOUS
  Filled 2016-08-25: qty 50

## 2016-08-25 MED ORDER — POTASSIUM CHLORIDE CRYS ER 20 MEQ PO TBCR
40.0000 meq | EXTENDED_RELEASE_TABLET | Freq: Four times a day (QID) | ORAL | Status: AC
  Administered 2016-08-25 (×2): 40 meq via ORAL
  Filled 2016-08-25 (×2): qty 2

## 2016-08-25 MED ORDER — INSULIN GLARGINE 100 UNIT/ML ~~LOC~~ SOLN
40.0000 [IU] | Freq: Every day | SUBCUTANEOUS | Status: DC
  Administered 2016-08-25 – 2016-08-27 (×3): 40 [IU] via SUBCUTANEOUS
  Filled 2016-08-25 (×5): qty 0.4

## 2016-08-25 NOTE — Progress Notes (Signed)
PROGRESS NOTE                                                                                                                                                                                                             Patient Demographics:    Elizabeth Pugh, is a 83 y.o. female, DOB - Jun 12, 1926, TWS:[id number]  Admit date - 08/23/2016   Admitting Physician Thurnell Lose, MD  Outpatient Primary MD for the patient is Philis Fendt, MD  LOS - 2  Chief Complaint  Patient presents with  . Cellulitis       Brief Narrative   Elizabeth Pugh  is a 83 y.o. female, History of morbid obesity, history of stroke now bedbound, chronic systolic and diastolic CHF EF 00% on 4 L nasal cannula oxygen, chronic atrial fibrillation on Eliquis, GERD, DM type 2 insulin-dependent, she was admitted for left leg cellulitis reoccurrence along with acute on chronic combined systolic and diastolic CHF.   Subjective:    Elizabeth Pugh today has, No headache, No chest pain, No abdominal pain - No Nausea, No new weakness tingling or numbness, No Cough - SOB.  Feels better.   Assessment  & Plan :     1. Recurrent left leg cellulitis. Replaced on IV vancomycin and meropenem, Remains afebrile and nonseptic, continue antibiotic diuresis to reduce edema, can taper to PO ABX in the am if continues to improve.  2. Acute on chronic combined systolic diastolic CHF EF 17-49%. Continue beta blocker, continue diuresis with IV Lasix and Zaroxolyn, placed on salt and fluid restriction, added UNNA boots, foley placed 08-25-16 to monitor Urine output, monitor weight, BMP, she is on 4 L nasal cannula oxygen at baseline which will be continued as well along with breathing treatments if needed.  3. GERD. Remains on PPI.  4. Morbid obesity with obstructive sleep apnea. Daily at bedtime CPAP and follow with PCP for weight loss.  5. Chronic atrial fibrillation,  history of PE. [place] vasc 2 score of at least 4. Continue Lopressor along with Eliquis.  6. History of stroke. Supportive skin no acute issues she is now bedbound.   7.DM type II. On Lantus and sliding scale, have adjusted Lantus dose to 40 U daily and added pre-meal NovoLog for better control  Lab Results  Component Value Date   HGBA1C 8.7 (  H) 08/23/2016   CBG (last 3)   Recent Labs  08/24/16 1156 08/24/16 1642 08/24/16 2145  GLUCAP 198* 231* 208*      Diet : Diet heart healthy/carb modified Room service appropriate? Yes; Fluid consistency: Thin; Fluid restriction: 1500 mL Fluid    Family Communication  :  daughters  Code Status :  Full  Disposition Plan  :  HHPT  Consults  :  None  Procedures  :  None  DVT Prophylaxis  :  Eliquis  Lab Results  Component Value Date   PLT 160 08/24/2016    Inpatient Medications  Scheduled Meds: . apixaban  5 mg Oral BID  . calcium-vitamin D  1 tablet Oral BID  . ferrous sulfate  325 mg Oral Q supper  . furosemide  60 mg Intravenous BID  . insulin aspart  0-5 Units Subcutaneous QHS  . insulin aspart  0-9 Units Subcutaneous TID WC  . insulin aspart  3 Units Subcutaneous TID WC  . insulin glargine  35 Units Subcutaneous QHS  . levETIRAcetam  500 mg Oral BID  . mouth rinse  15 mL Mouth Rinse BID  . metolazone  2.5 mg Oral Daily  . metoprolol tartrate  12.5 mg Oral BID  . pantoprazole  40 mg Oral q morning - 10a  . potassium chloride  40 mEq Oral Q6H   Continuous Infusions: . magnesium sulfate 1 - 4 g bolus IVPB    . meropenem (MERREM) IV Stopped (08/24/16 2312)  . vancomycin Stopped (08/24/16 2200)   PRN Meds:.acetaminophen, albuterol, HYDROcodone-acetaminophen, hydrOXYzine, naphazoline-pheniramine, ondansetron **OR** ondansetron (ZOFRAN) IV, polyethylene glycol  Antibiotics  :    Anti-infectives    Start     Dose/Rate Route Frequency Ordered Stop   08/24/16 1830  vancomycin (VANCOCIN) 1,500 mg in sodium chloride 0.9  % 500 mL IVPB     1,500 mg 250 mL/hr over 120 Minutes Intravenous Every 24 hours 08/23/16 1750     08/23/16 2200  meropenem (MERREM) 2 g in sodium chloride 0.9 % 100 mL IVPB     2 g 200 mL/hr over 30 Minutes Intravenous Every 12 hours 08/23/16 1750     08/23/16 1800  vancomycin (VANCOCIN) 2,500 mg in sodium chloride 0.9 % 500 mL IVPB     2,500 mg 250 mL/hr over 120 Minutes Intravenous  Once 08/23/16 1750 08/23/16 2242   08/23/16 1630  doxycycline (VIBRAMYCIN) 100 mg in dextrose 5 % 250 mL IVPB  Status:  Discontinued     100 mg 125 mL/hr over 120 Minutes Intravenous  Once 08/23/16 1548 08/23/16 1830         Objective:   Vitals:   08/24/16 1700 08/24/16 2201 08/25/16 0022 08/25/16 0504  BP:   100/80 118/70  Pulse:  91  89  Resp:    19  Temp:    98 F (36.7 C)  TempSrc:      SpO2:  100%  100%  Weight: 120.2 kg (264 lb 14.4 oz)     Height: 5\' 5"  (1.651 m)       Wt Readings from Last 3 Encounters:  08/24/16 120.2 kg (264 lb 14.4 oz)  08/13/16 132.5 kg (292 lb 3.2 oz)  08/05/16 127 kg (280 lb)     Intake/Output Summary (Last 24 hours) at 08/25/16 0752 Last data filed at 08/25/16 0655  Gross per 24 hour  Intake             1225 ml  Output  526 ml  Net              699 ml     Physical Exam  Awake Alert, Oriented X 3, No new F.N deficits, Normal affect [place].AT,PERRAL Supple Neck,No JVD, No cervical lymphadenopathy appriciated.  Symmetrical Chest wall movement, Good air movement bilaterally, CTAB RRR,No Gallops,Rubs or new Murmurs, No Parasternal Heave +ve B.Sounds, Abd Soft, No tenderness, No organomegaly appriciated, No rebound - guarding or rigidity. 1+ right leg and 2+ left leg edema, 1+ thigh and abdominal wall edema, left leg cellulitis improved    Data Review:    CBC  Recent Labs Lab 08/23/16 1635 08/24/16 0724  WBC 7.0 4.3  HGB 10.7* 10.4*  HCT 35.3* 35.3*  PLT 165 160  MCV 97.8 97.5  MCH 29.6 28.7  MCHC 30.3 29.5*  RDW 14.3 14.3    LYMPHSABS 0.7  --   MONOABS 0.2  --   EOSABS 0.0  --   BASOSABS 0.0  --     Chemistries   Recent Labs Lab 08/23/16 1635 08/24/16 0724 08/25/16 0326  NA 138 141 141  K 4.3 4.0 3.6  CL 97* 98* 93*  CO2 34* 35* 39*  GLUCOSE 352* 213* 155*  BUN 13 13 15   CREATININE 1.09* 0.94 1.06*  CALCIUM 9.1 9.1 9.1  MG  --   --  1.6*  AST 14*  --   --   ALT 7*  --   --   ALKPHOS 47  --   --   BILITOT 0.6  --   --    ------------------------------------------------------------------------------------------------------------------ No results for input(s): CHOL, HDL, LDLCALC, TRIG, CHOLHDL, LDLDIRECT in the last 72 hours.  Lab Results  Component Value Date   HGBA1C 8.7 (H) 08/23/2016   ------------------------------------------------------------------------------------------------------------------ No results for input(s): TSH, T4TOTAL, T3FREE, THYROIDAB in the last 72 hours.  Invalid input(s): FREET3 ------------------------------------------------------------------------------------------------------------------ No results for input(s): VITAMINB12, FOLATE, FERRITIN, TIBC, IRON, RETICCTPCT in the last 72 hours.  Coagulation profile  Recent Labs Lab 08/23/16 1635  INR 1.07    No results for input(s): DDIMER in the last 72 hours.  Cardiac Enzymes No results for input(s): CKMB, TROPONINI, MYOGLOBIN in the last 168 hours.  Invalid input(s): CK ------------------------------------------------------------------------------------------------------------------    Component Value Date/Time   BNP 186.5 (H) 08/10/2016 1228    Micro Results No results found for this or any previous visit (from the past 240 hour(s)).  Radiology Reports Portable Chest 1 View  Result Date: 08/11/2016 CLINICAL DATA:  Acute respiratory failure with hypoxia EXAM: PORTABLE CHEST 1 VIEW COMPARISON:  08/10/2016 FINDINGS: Cardiomegaly with vascular congestion. Low lung volumes. Small right pleural  effusion again noted. No confluent opacities or edema. IMPRESSION: Cardiomegaly with vascular congestion.  Stable small right effusion. Electronically Signed   By: Rolm Baptise M.D.   On: 08/11/2016 07:18   Dg Chest Portable 1 View  Result Date: 08/10/2016 CLINICAL DATA:  Shortness of breath and cellulitis EXAM: PORTABLE CHEST 1 VIEW COMPARISON:  08/10/2016 FINDINGS: Cardiac shadow remains enlarged. The lungs are well aerated bilaterally. Some blunting of the right costophrenic angle consistent with small effusion on the right is noted better visualize from the prior exam. No new focal infiltrate is seen. IMPRESSION: Small right effusion.  No other new focal abnormality is seen. Electronically Signed   By: Inez Catalina M.D.   On: 08/10/2016 19:22   Dg Chest Port 1 View  Result Date: 08/10/2016 CLINICAL DATA:  Tachycardia and lethargy.  Fever. EXAM: PORTABLE CHEST  1 VIEW COMPARISON:  July 13, 2016 FINDINGS: There is no edema or consolidation. Heart is enlarged with pulmonary vascularity within normal limits. No adenopathy. There is atherosclerotic calcification in the aorta. There is evidence of old trauma involving the left clavicle. IMPRESSION: Cardiomegaly. No edema or consolidation. There is aortic atherosclerosis. Electronically Signed   By: Lowella Grip III M.D.   On: 08/10/2016 12:33    Time Spent in minutes  30   Lala Lund M.D on 08/25/2016 at 7:52 AM  Between 7am to 7pm - Pager - [phone] ( page via [facility], text pages only, please mention full 10 digit call back number). After 7pm go to [email or web address] - password [facility]

## 2016-08-25 NOTE — Progress Notes (Signed)
Patient has an ordered for Foley Catheter for aggressive IV diuresis.  Patient is incontinent so unable to measure the output.  The insertion was attempted three times by the day shift but unsuccessful.  The Nurse for first 4 hours of  Night shift was unable to insert after assessing the patient. Patient is unable to bed her legs and uncomfortable .

## 2016-08-25 NOTE — Progress Notes (Signed)
Foley Catheter inserted for aggressive IV diuresis.  Patient educated prior to insertion.  Patient tolerated insertion well.  Leg strapped placed to secure the catheter. Time and date placed on the bag.  Bag is below the level of the bladder.  Will continue to monitor patient

## 2016-08-25 NOTE — Procedures (Signed)
Patient refused CPAP for the night  

## 2016-08-25 NOTE — Progress Notes (Signed)
Pt. Refused cpap for tonight. RT informed pt. To notify if she changes her mind. 

## 2016-08-26 DIAGNOSIS — I5033 Acute on chronic diastolic (congestive) heart failure: Secondary | ICD-10-CM

## 2016-08-26 DIAGNOSIS — L03116 Cellulitis of left lower limb: Principal | ICD-10-CM

## 2016-08-26 LAB — BASIC METABOLIC PANEL
Anion gap: 8 (ref 5–15)
BUN: 14 mg/dL (ref 6–20)
CHLORIDE: 90 mmol/L — AB (ref 101–111)
CO2: 41 mmol/L — AB (ref 22–32)
Calcium: 9.3 mg/dL (ref 8.9–10.3)
Creatinine, Ser: 1.13 mg/dL — ABNORMAL HIGH (ref 0.44–1.00)
GFR calc Af Amer: 49 mL/min — ABNORMAL LOW (ref >60)
GFR, EST NON AFRICAN AMERICAN: 42 mL/min — AB (ref >60)
GLUCOSE: 138 mg/dL — AB (ref 65–99)
POTASSIUM: 3.7 mmol/L (ref 3.5–5.1)
SODIUM: 139 mmol/L (ref 135–145)

## 2016-08-26 LAB — GLUCOSE, CAPILLARY
GLUCOSE-CAPILLARY: 147 mg/dL — AB (ref 65–99)
GLUCOSE-CAPILLARY: 170 mg/dL — AB (ref 65–99)
GLUCOSE-CAPILLARY: 184 mg/dL — AB (ref 65–99)
Glucose-Capillary: 123 mg/dL — ABNORMAL HIGH (ref 65–99)

## 2016-08-26 LAB — MAGNESIUM: Magnesium: 2.1 mg/dL (ref 1.7–2.4)

## 2016-08-26 NOTE — Progress Notes (Signed)
Patient refuses the use of CPAP 

## 2016-08-26 NOTE — Progress Notes (Signed)
PROGRESS NOTE                                                                                                                                                                                                             Patient Demographics:    Elizabeth Pugh, is a 83 y.o. female, DOB - 07-27-1926, TIR:[id number]  Admit date - 08/23/2016   Admitting Physician Thurnell Lose, MD  Outpatient Primary MD for the patient is Philis Fendt, MD  LOS - 3  Chief Complaint  Patient presents with  . Cellulitis       Brief Narrative   Elizabeth Pugh  is a 83 y.o. female, History of morbid obesity, history of stroke now bedbound, chronic systolic and diastolic CHF EF 67% on 4 L nasal cannula oxygen, chronic atrial fibrillation on Eliquis, GERD, DM type 2 insulin-dependent, she was admitted for left leg cellulitis reoccurrence along with acute on chronic combined systolic and diastolic CHF.   Subjective:    Elizabeth Pugh today reports feeling better, her legs are wrapped and wanted me to call her daughter.    Assessment  & Plan :     1. Recurrent left leg cellulitis. Replaced on IV vancomycin and meropenem, Remains afebrile and nonseptic, continue antibioticsfor another 24 hours, and IV diuresis to reduce edema,  Plan to change to oral antibiotics on discharge.   2. Acute on chronic combined systolic diastolic CHF EF 61-95%. Continue beta blocker, continue diuresis with IV Lasix and Zaroxolyn, placed on salt and fluid restriction, added UNNA boots, foley placed 08-25-16 to monitor Urine output, monitor weight, BMP, she is on 4 L nasal cannula oxygen at baseline which will be continued as well along with breathing treatments if needed. She has diuresed about 3.5 liters so far, would recommend continue with IV lasix daily for another day, and check renal parameters in am. Monitor potassium and mag in am.   3. GERD. Remains on  PPI.  4. Morbid obesity with obstructive sleep apnea. Daily at bedtime CPAP and follow with PCP for weight loss.  5. Chronic atrial fibrillation, history of PE. [place] vasc 2 score of at least 4. Continue Lopressor along with Eliquis.  6. History of stroke. Supportive skin no acute issues she is now bedbound.   7.DM type II. On Lantus and sliding scale, have adjusted  Lantus dose to 40 U daily and added pre-meal NovoLog for better control  Lab Results  Component Value Date   HGBA1C 8.7 (H) 08/23/2016   CBG (last 3)   Recent Labs  08/25/16 2131 08/26/16 0756 08/26/16 1221  GLUCAP 170* 123* 147*      Diet : Diet heart healthy/carb modified Room service appropriate? Yes; Fluid consistency: Thin; Fluid restriction: 1500 mL Fluid    Family Communication  :  daughters  Code Status :  Full  Disposition Plan  :  HHPT  Consults  :  None  Procedures  :  None  DVT Prophylaxis  :  Eliquis  Lab Results  Component Value Date   PLT 160 08/24/2016    Inpatient Medications  Scheduled Meds: . apixaban  5 mg Oral BID  . calcium-vitamin D  1 tablet Oral BID  . ferrous sulfate  325 mg Oral Q supper  . furosemide  60 mg Intravenous BID  . insulin aspart  0-5 Units Subcutaneous QHS  . insulin aspart  0-9 Units Subcutaneous TID WC  . insulin aspart  3 Units Subcutaneous TID WC  . insulin glargine  40 Units Subcutaneous QHS  . levETIRAcetam  500 mg Oral BID  . mouth rinse  15 mL Mouth Rinse BID  . metoprolol tartrate  12.5 mg Oral BID  . pantoprazole  40 mg Oral q morning - 10a   Continuous Infusions: . meropenem (MERREM) IV Stopped (08/26/16 0940)  . vancomycin Stopped (08/25/16 2130)   PRN Meds:.acetaminophen, albuterol, HYDROcodone-acetaminophen, hydrOXYzine, naphazoline-pheniramine, ondansetron **OR** ondansetron (ZOFRAN) IV, polyethylene glycol  Antibiotics  :    Anti-infectives    Start     Dose/Rate Route Frequency Ordered Stop   08/24/16 1830  vancomycin  (VANCOCIN) 1,500 mg in sodium chloride 0.9 % 500 mL IVPB     1,500 mg 250 mL/hr over 120 Minutes Intravenous Every 24 hours 08/23/16 1750     08/23/16 2200  meropenem (MERREM) 2 g in sodium chloride 0.9 % 100 mL IVPB     2 g 200 mL/hr over 30 Minutes Intravenous Every 12 hours 08/23/16 1750     08/23/16 1800  vancomycin (VANCOCIN) 2,500 mg in sodium chloride 0.9 % 500 mL IVPB     2,500 mg 250 mL/hr over 120 Minutes Intravenous  Once 08/23/16 1750 08/23/16 2242   08/23/16 1630  doxycycline (VIBRAMYCIN) 100 mg in dextrose 5 % 250 mL IVPB  Status:  Discontinued     100 mg 125 mL/hr over 120 Minutes Intravenous  Once 08/23/16 1548 08/23/16 1830         Objective:   Vitals:   08/25/16 1439 08/25/16 2141 08/26/16 0618 08/26/16 1438  BP: 120/69 133/75 138/77 113/77  Pulse: 87 66 81 80  Resp: 18 20 20 18   Temp: 99.2 F (37.3 C) 98.6 F (37 C) 98.5 F (36.9 C) 98.4 F (36.9 C)  TempSrc: Oral     SpO2: 100% 100% 100% 100%  Weight:      Height:        Wt Readings from Last 3 Encounters:  08/13/16 132.5 kg (292 lb 3.2 oz)  08/05/16 127 kg (280 lb)  07/20/16 125.6 kg (276 lb 14.4 oz)     Intake/Output Summary (Last 24 hours) at 08/26/16 1505 Last data filed at 08/26/16 1224  Gross per 24 hour  Intake              480 ml  Output  4150 ml  Net            -3670 ml     Physical Exam  Awake Alert, Oriented X 3, No new F.N deficits, Normal affect [place].AT,PERRAL Symmetrical Chest wall movement, Good air movement bilaterally, CTAB RRR,No Gallops,Rubs or new Murmurs, No Parasternal Heave +ve B.Sounds, Abd Soft, No tenderness, No organomegaly appriciated, No rebound - guarding or rigidity. 1+ right leg and 2+ left leg edema, 1+ thigh and abdominal wall edema, left leg cellulitis improved, tenderness still present.     Data Review:    CBC  Recent Labs Lab 08/23/16 1635 08/24/16 0724  WBC 7.0 4.3  HGB 10.7* 10.4*  HCT 35.3* 35.3*  PLT 165 160  MCV 97.8 97.5   MCH 29.6 28.7  MCHC 30.3 29.5*  RDW 14.3 14.3  LYMPHSABS 0.7  --   MONOABS 0.2  --   EOSABS 0.0  --   BASOSABS 0.0  --     Chemistries   Recent Labs Lab 08/23/16 1635 08/24/16 0724 08/25/16 0326 08/26/16 0537  NA 138 141 141 139  K 4.3 4.0 3.6 3.7  CL 97* 98* 93* 90*  CO2 34* 35* 39* 41*  GLUCOSE 352* 213* 155* 138*  BUN 13 13 15 14   CREATININE 1.09* 0.94 1.06* 1.13*  CALCIUM 9.1 9.1 9.1 9.3  MG  --   --  1.6* 2.1  AST 14*  --   --   --   ALT 7*  --   --   --   ALKPHOS 47  --   --   --   BILITOT 0.6  --   --   --    ------------------------------------------------------------------------------------------------------------------ No results for input(s): CHOL, HDL, LDLCALC, TRIG, CHOLHDL, LDLDIRECT in the last 72 hours.  Lab Results  Component Value Date   HGBA1C 8.7 (H) 08/23/2016   ------------------------------------------------------------------------------------------------------------------ No results for input(s): TSH, T4TOTAL, T3FREE, THYROIDAB in the last 72 hours.  Invalid input(s): FREET3 ------------------------------------------------------------------------------------------------------------------ No results for input(s): VITAMINB12, FOLATE, FERRITIN, TIBC, IRON, RETICCTPCT in the last 72 hours.  Coagulation profile  Recent Labs Lab 08/23/16 1635  INR 1.07    No results for input(s): DDIMER in the last 72 hours.  Cardiac Enzymes No results for input(s): CKMB, TROPONINI, MYOGLOBIN in the last 168 hours.  Invalid input(s): CK ------------------------------------------------------------------------------------------------------------------    Component Value Date/Time   BNP 186.5 (H) 08/10/2016 1228    Micro Results No results found for this or any previous visit (from the past 240 hour(s)).  Radiology Reports Portable Chest 1 View  Result Date: 08/11/2016 CLINICAL DATA:  Acute respiratory failure with hypoxia EXAM: PORTABLE CHEST 1  VIEW COMPARISON:  08/10/2016 FINDINGS: Cardiomegaly with vascular congestion. Low lung volumes. Small right pleural effusion again noted. No confluent opacities or edema. IMPRESSION: Cardiomegaly with vascular congestion.  Stable small right effusion. Electronically Signed   By: Rolm Baptise M.D.   On: 08/11/2016 07:18   Dg Chest Portable 1 View  Result Date: 08/10/2016 CLINICAL DATA:  Shortness of breath and cellulitis EXAM: PORTABLE CHEST 1 VIEW COMPARISON:  08/10/2016 FINDINGS: Cardiac shadow remains enlarged. The lungs are well aerated bilaterally. Some blunting of the right costophrenic angle consistent with small effusion on the right is noted better visualize from the prior exam. No new focal infiltrate is seen. IMPRESSION: Small right effusion.  No other new focal abnormality is seen. Electronically Signed   By: Inez Catalina M.D.   On: 08/10/2016 19:22   Dg Chest Port 1  View  Result Date: 08/10/2016 CLINICAL DATA:  Tachycardia and lethargy.  Fever. EXAM: PORTABLE CHEST 1 VIEW COMPARISON:  July 13, 2016 FINDINGS: There is no edema or consolidation. Heart is enlarged with pulmonary vascularity within normal limits. No adenopathy. There is atherosclerotic calcification in the aorta. There is evidence of old trauma involving the left clavicle. IMPRESSION: Cardiomegaly. No edema or consolidation. There is aortic atherosclerosis. Electronically Signed   By: Lowella Grip III M.D.   On: 08/10/2016 12:33    Time Spent in minutes  26   Amrit Cress M.D on 08/26/2016 at 3:05 PM  Between 7am to 7pm - Pager - [phone] ( page via [facility], text pages only, please mention full 10 digit call back number). After 7pm go to [email or web address] - password [facility]

## 2016-08-26 NOTE — Progress Notes (Signed)
Pharmacy Antibiotic Note  Elizabeth Pugh is a 83 y.o. female admitted on 08/23/2016 with cellulitis.  Pharmacy has been consulted for Vancomycin and Meropenem dosing.  Today is day #4 of antibiotics.  No cx data available.  Renal function is stable.  WBC wnl.  Plan: Continue Meropenem 2g IV q12h Continue Vancomycin 1500mg  IV q24h Spoke with MD, plan for discharge 4/26 on oral antibiotics pending wound improvement.  Will defer Vancomycin trough at this time.  Height: 5\' 5"  (165.1 cm) Weight:  (bed is not zeroed out.  Says -76 lb with patient on) IBW/kg (Calculated) : 57  Temp (24hrs), Avg:98.5 F (36.9 C), Min:98.4 F (36.9 C), Max:98.6 F (37 C)   Recent Labs Lab 08/23/16 1635 08/24/16 0724 08/25/16 0326 08/26/16 0537  WBC 7.0 4.3  --   --   CREATININE 1.09* 0.94 1.06* 1.13*    Estimated Creatinine Clearance: 44.7 mL/min (A) (by C-G formula based on SCr of 1.13 mg/dL (H)).    Allergies  Allergen Reactions  . Penicillins Rash    Has patient had a PCN reaction causing immediate rash, facial/tongue/throat swelling, SOB or lightheadedness with hypotension: Yes Has patient had a PCN reaction causing severe rash involving mucus membranes or skin necrosis: No Has patient had a PCN reaction that required hospitalization: No Has patient had a PCN reaction occurring within the last 10 years: No If all of the above answers are "NO", then may proceed with Cephalosporin use. *Tolerates cephalosporins  . Erythromycin Itching and Other (See Comments)    Vaginal itching  . Zithromax [Azithromycin] Other (See Comments)    Caused a yeast infection     Antimicrobials this admission: Doxy 4/22 x 1 Vanc 4/22 >>  Merrem 4/22 >>   Dose adjustments this admission:   Microbiology results: none  Thank you for allowing pharmacy to be a part of this patient's care.  [employer], Pharm.D., BCPS Clinical Pharmacist Pager: [phone] Clinical phone for 08/26/2016 from  8:30-4:00 is [phone]. After 4pm, please call Main Rx [phone]) for assistance. 08/26/2016 2:59 PM

## 2016-08-27 DIAGNOSIS — I482 Chronic atrial fibrillation: Secondary | ICD-10-CM

## 2016-08-27 DIAGNOSIS — I69359 Hemiplegia and hemiparesis following cerebral infarction affecting unspecified side: Secondary | ICD-10-CM

## 2016-08-27 LAB — BASIC METABOLIC PANEL
ANION GAP: 10 (ref 5–15)
BUN: 18 mg/dL (ref 6–20)
CALCIUM: 9.4 mg/dL (ref 8.9–10.3)
CO2: 38 mmol/L — ABNORMAL HIGH (ref 22–32)
Chloride: 88 mmol/L — ABNORMAL LOW (ref 101–111)
Creatinine, Ser: 1.27 mg/dL — ABNORMAL HIGH (ref 0.44–1.00)
GFR calc non Af Amer: 37 mL/min — ABNORMAL LOW (ref >60)
GFR, EST AFRICAN AMERICAN: 42 mL/min — AB (ref >60)
GLUCOSE: 281 mg/dL — AB (ref 65–99)
Potassium: 3.3 mmol/L — ABNORMAL LOW (ref 3.5–5.1)
SODIUM: 136 mmol/L (ref 135–145)

## 2016-08-27 LAB — GLUCOSE, CAPILLARY
GLUCOSE-CAPILLARY: 236 mg/dL — AB (ref 65–99)
Glucose-Capillary: 299 mg/dL — ABNORMAL HIGH (ref 65–99)
Glucose-Capillary: 232 mg/dL — ABNORMAL HIGH (ref 65–99)
Glucose-Capillary: 225 mg/dL — ABNORMAL HIGH (ref 65–99)

## 2016-08-27 MED ORDER — FUROSEMIDE 40 MG PO TABS
40.0000 mg | ORAL_TABLET | Freq: Every day | ORAL | Status: DC
  Administered 2016-08-28: 40 mg via ORAL
  Filled 2016-08-27: qty 1

## 2016-08-27 MED ORDER — POTASSIUM CHLORIDE CRYS ER 20 MEQ PO TBCR
40.0000 meq | EXTENDED_RELEASE_TABLET | Freq: Two times a day (BID) | ORAL | Status: AC
  Administered 2016-08-27 (×2): 40 meq via ORAL
  Filled 2016-08-27 (×2): qty 2

## 2016-08-27 MED ORDER — CLINDAMYCIN HCL 300 MG PO CAPS: 300.0000 mg | ORAL_CAPSULE | Freq: Three times a day (TID) | ORAL | 0 refills | Status: DC

## 2016-08-27 MED ORDER — INSULIN GLARGINE 100 UNIT/ML ~~LOC~~ SOLN: 40.0000 [IU] | Freq: Every day | SUBCUTANEOUS | 11 refills | Status: AC

## 2016-08-27 NOTE — Progress Notes (Addendum)
CSW staffed case with the Surveyor, quantity. Patient is not appropriate for rehab. CSW spoke with patient's daughter. She expressed understanding and is not interested in long term placement. Patient will discharge home with home health when medically stable. RNCM and MD updated.   Patient was at [facility] from 06/23/16 to 3/10 and is in Medicare copay days. CSW checking to see if her secondary insurance will pay.  Percell Locus Ilyssa Grennan LCSWA [phone]

## 2016-08-27 NOTE — Care Management Important Message (Signed)
Important Message  Patient Details  Name: MINNETTE MERIDA MRN: [id number] Date of Birth: 02/28/1927   Medicare Important Message Given:  Yes    Barrie Wale Abena 08/27/2016, 1:06 PM

## 2016-08-27 NOTE — Progress Notes (Signed)
Day shift nurse reported that the patient was transfer to the chair and the bed was "zeroed out" only to -6.  When the patient was weight this AM the bed read -76 lbs with the patient. Patient preferred to rest than be transferred in order to equalize the bed.   Will notify the on coming day nurse and continue to monitor the patient

## 2016-08-27 NOTE — Progress Notes (Signed)
Pt refusing CPAP

## 2016-08-27 NOTE — Progress Notes (Signed)
PT Cancellation Note  Patient Details Name: Elizabeth Pugh MRN: [id number] DOB: Jul 12, 1926   Cancelled Treatment:    Reason Eval/Treat Not Completed: PT screened, no needs identified, will sign off. Prior to hospitalization, pt was bed bound and was getting OOB with mechanical lift. Recommend mechanical lift for transfers.    Scheryl Marten PT, DPT  [phone]  08/27/2016, 11:05 AM

## 2016-08-27 NOTE — NC FL2 (Signed)
[place] LEVEL OF CARE SCREENING TOOL     IDENTIFICATION  Patient Name: Elizabeth Pugh Birthdate: Sep 21, 1926 Sex: female Admission Date (Current Location): 08/23/2016  [facility] and [place] Number:  Herbalist and Address:  The . [facility], [place] [address], [place], [place] [address]      Provider Number: [id number]  Attending Physician Name and Address:  Hosie Poisson, MD  Relative Name and Phone Number:       Current Level of Care: Hospital Recommended Level of Care: [place] Prior Approval Number:    Date Approved/Denied:   PASRR Number: [id number]  Discharge Plan: SNF    Current Diagnoses: Patient Active Problem List   Diagnosis Date Noted  . Cellulitis of left leg 08/23/2016  . Sepsis ([place]) 08/10/2016  . Morbid obesity with BMI of 45.0-49.9, adult ([place]) 08/10/2016  . Cellulitis of left lower extremity 08/10/2016  . Acute respiratory failure with hypercapnia ([place]) 07/13/2016  . Acute respiratory failure with hypoxia and hypercapnia ([place]) 06/16/2016  . Urinary tract infection without hematuria 06/16/2016  . HCAP (healthcare-associated pneumonia) 06/16/2016  . Acute respiratory failure ([place]) 06/16/2016  . CAP (community acquired pneumonia) 06/09/2016  . Cellulitis 12/16/2015  . Confusion 12/11/2015  . SIRS (systemic inflammatory response syndrome) ([place]) 12/11/2015  . Chronic respiratory failure with hypoxia and hypercapnia ([place]) 09/28/2015  . Acute encephalopathy   . Acute on chronic congestive heart failure ([place])   . Needs sleep apnea assessment   . Acute on chronic diastolic (congestive) heart failure ([place]) 09/22/2015  . Acute kidney injury ([place]) 09/02/2015  . Lobar pneumonia due to unspecified organism 09/02/2015  . Bilateral leg edema   . Acute on chronic respiratory failure with hypoxia and hypercapnia ([place]) 08/29/2015  . History of pulmonary embolism 01/27/2015  . Chest pain at rest 01/27/2015   . Shortness of breath at rest 01/27/2015  . Chronic combined systolic and diastolic heart failure ([place]) 01/27/2015  . Thrombocytopenia ([place]) 01/27/2015  . Abnormal findings on radiological examination of gastrointestinal tract 01/27/2015  . Chronic atrial fibrillation ([place])   . Seizure disorder ([place])   . Anxiety   . Anemia   . Restless leg syndrome   . Acute delirium 04/12/2011  . DM (diabetes mellitus), type 2, uncontrolled with complications ([place]) 23/30/0762  . CVA, old, hemiparesis ([place]) 03/25/2011  . Hypertension 03/25/2011    Orientation RESPIRATION BLADDER Height & Weight     Self, Time, Situation, Place  O2 (Nasal cannula 3.5L; Bipap at night ) Incontinent, Indwelling catheter Weight: 113.4 kg (250 lb) Height:  5\' 5"  (165.1 cm)  BEHAVIORAL SYMPTOMS/MOOD NEUROLOGICAL BOWEL NUTRITION STATUS      Incontinent Diet (Please see DC Summary)  AMBULATORY STATUS COMMUNICATION OF NEEDS Skin   Total Care Verbally Normal                       Personal Care Assistance Level of Assistance  Bathing, Feeding, Dressing Bathing Assistance: Maximum assistance Feeding assistance: Limited assistance Dressing Assistance: Limited assistance     Functional Limitations Info  Hearing, Sight Sight Info: Impaired Hearing Info: Adequate      SPECIAL CARE FACTORS FREQUENCY  PT (By licensed PT)     PT Frequency: not yet assessed              Contractures      Additional Factors Info  Code Status, Allergies Code Status Info: Full Allergies Info: Penicillins, Erythromycin, Zithromax Azithromycin  Current Medications (08/27/2016):  This is the current hospital active medication list Current Facility-Administered Medications  Medication Dose Route Frequency Provider Last Rate Last Dose  . acetaminophen (TYLENOL) tablet 325-650 mg  325-650 mg Oral Q6H PRN Thurnell Lose, MD      . albuterol (PROVENTIL) (2.5 MG/3ML) 0.083% nebulizer solution 2.5 mg  2.5 mg  Nebulization Q6H PRN Thurnell Lose, MD      . apixaban (ELIQUIS) tablet 5 mg  5 mg Oral BID Valeda Malm Rumbarger, RPH   5 mg at 08/27/16 1006  . calcium-vitamin D (OSCAL WITH D) 500-200 MG-UNIT per tablet 1 tablet  1 tablet Oral BID Thurnell Lose, MD   1 tablet at 08/27/16 1005  . ferrous sulfate tablet 325 mg  325 mg Oral Q supper Thurnell Lose, MD   325 mg at 08/26/16 1736  . [START ON 08/28/2016] furosemide (LASIX) tablet 40 mg  40 mg Oral Daily Hosie Poisson, MD      . HYDROcodone-acetaminophen (NORCO/VICODIN) 5-325 MG per tablet 1 tablet  1 tablet Oral Q6H PRN Thurnell Lose, MD      . hydrOXYzine (ATARAX/VISTARIL) tablet 25 mg  25 mg Oral Q8H PRN Thurnell Lose, MD      . insulin aspart (novoLOG) injection 0-5 Units  0-5 Units Subcutaneous QHS Thurnell Lose, MD   2 Units at 08/24/16 2300  . insulin aspart (novoLOG) injection 0-9 Units  0-9 Units Subcutaneous TID WC Thurnell Lose, MD   3 Units at 08/27/16 1226  . insulin aspart (novoLOG) injection 3 Units  3 Units Subcutaneous TID WC Thurnell Lose, MD   3 Units at 08/27/16 1226  . insulin glargine (LANTUS) injection 40 Units  40 Units Subcutaneous QHS Thurnell Lose, MD   40 Units at 08/26/16 2159  . levETIRAcetam (KEPPRA) tablet 500 mg  500 mg Oral BID Thurnell Lose, MD   500 mg at 08/27/16 1006  . MEDLINE mouth rinse  15 mL Mouth Rinse BID Thurnell Lose, MD   15 mL at 08/27/16 1007  . metoprolol tartrate (LOPRESSOR) tablet 12.5 mg  12.5 mg Oral BID Thurnell Lose, MD   12.5 mg at 08/27/16 1006  . naphazoline-pheniramine (NAPHCON-A) 0.025-0.3 % ophthalmic solution 2 drop  2 drop Both Eyes QID PRN Thurnell Lose, MD      . ondansetron (ZOFRAN) tablet 4 mg  4 mg Oral Q6H PRN Thurnell Lose, MD       Or  . ondansetron (ZOFRAN) injection 4 mg  4 mg Intravenous Q6H PRN Thurnell Lose, MD      . pantoprazole (PROTONIX) EC tablet 40 mg  40 mg Oral q morning - 10a Thurnell Lose, MD   40 mg at 08/27/16 1006  .  polyethylene glycol (MIRALAX / GLYCOLAX) packet 17 g  17 g Oral Daily PRN Thurnell Lose, MD      . potassium chloride SA (K-DUR,KLOR-CON) CR tablet 40 mEq  40 mEq Oral BID Hosie Poisson, MD   40 mEq at 08/27/16 1225     Discharge Medications: Please see discharge summary for a list of discharge medications.  Relevant Imaging Results:  Relevant Lab Results:   Additional Information [phone] Patient requires Bi-PAP at night.  Benard Halsted, LCSWA

## 2016-08-27 NOTE — Care Management Note (Signed)
Case Management Note  Patient Details  Name: Elizabeth Pugh MRN: [id number] Date of Birth: [birth year]  Subjective/Objective:                  History of morbid obesity, history of stroke now bedbound, chronic systolic and diastolic CHF EF 83% on 4 L nasal cannula oxygen, chronic atrial fibrillation on Eliquis, GERD, DM type 2 insulin-dependent, she was admitted for left leg cellulitis reoccurrence along with acute on chronic combined systolic and diastolic CHF.  [name] (Daughter) Margarito Courser (Sister) Showing 2 of 2   [phone]          PCP: Nolene Ebbs  Action/Plan: SNF vs HH.....PT's pending evaluation  Expected Discharge Date:                  Expected Discharge Plan:  [place]  In-House Referral:  Clinical Social Work  Discharge planning Services  CM Consult  Post Acute Care Choice:  Resumption of Svcs/PTA Provider Choice offered to:  Patient  DME Arranged:    DME Agency:     HH Arranged:  RN,PT,NA [place] Agency:  [facility] [employer]  Status of Service:  In process, will continue to follow  If discussed at Long Length of Stay Meetings, dates discussed:    Additional Comments:  Sharin Mons, RN 08/27/2016, 2:36 PM

## 2016-08-27 NOTE — Progress Notes (Signed)
PROGRESS NOTE                                                                                                                                                                                                             Patient Demographics:    Elizabeth Pugh, is a 83 y.o. female, DOB - November 04, 1926, TXM:[id number]  Admit date - 08/23/2016   Admitting Physician Thurnell Lose, MD  Outpatient Primary MD for the patient is Philis Fendt, MD  LOS - 4  Chief Complaint  Patient presents with  . Cellulitis       Brief Narrative   Elizabeth Pugh  is a 83 y.o. female, History of morbid obesity, history of stroke now bedbound, chronic systolic and diastolic CHF EF 48% on 4 L nasal cannula oxygen, chronic atrial fibrillation on Eliquis, GERD, DM type 2 insulin-dependent, she was admitted for left leg cellulitis reoccurrence along with acute on chronic combined systolic and diastolic CHF.   Subjective:    Elizabeth Pugh reports feeling tired and has some pain in the  Left lateral aspect of the leg /.    Assessment  & Plan :     1. Recurrent left leg cellulitis.completed 5 days of IV vancomycin and meropenem, Remains afebrile and nonseptic, redness and tenderness improved.  Change to oral doxycycline in am for another 2 days to complete the course.  She has diuresed about 5 liters since admission.   2. Acute on chronic combined systolic diastolic CHF EF 25-00%. Continue beta blocker, started  diuresis with IV Lasix and Zaroxolyn, placed on salt and fluid restriction, added UNNA boots, foley placed 08-25-16 to monitor Urine output, monitor weight, BMP, she is on 4 L nasal cannula oxygen at baseline which will be continued as well along with breathing treatments if needed. She has diuresed about 5 liters so far,but as her creatinine worsened with diuresis, we have cut back on lasix to 40 mg daily po starting tomorrow. We will check  BMP in am to make sure creatinine is not getting worse.  Potassium will be replaced.   3. GERD. Remains on PPI.  4. Morbid obesity with obstructive sleep apnea. Daily at bedtime CPAP and follow with PCP for weight loss.  5. Chronic atrial fibrillation, history of PE. [place] vasc 2 score of at least 4. Continue Lopressor along  with Eliquis.  6. History of stroke. Supportive skin no acute issues  As she has been  bedbound.   7.DM type II. On Lantus and sliding scale, have adjusted Lantus dose to 40 U daily and added pre-meal NovoLog for better control  Lab Results  Component Value Date   HGBA1C 8.7 (H) 08/23/2016   CBG (last 3)   Recent Labs  08/26/16 2126 08/27/16 0757 08/27/16 1149  GLUCAP 184* 299* 232*   Resume SSI on discharge .   8. Acute kidney injurty: possibly from over diuresis. Stopped IV lasix, changed to po in am. Repeat BMP in am to make sure creatinine is not getting worse.   9. Hypokalemia: replaced.    Diet : Diet heart healthy/carb modified Room service appropriate? Yes; Fluid consistency: Thin; Fluid restriction: 1500 mL Fluid    Family Communication  :  daughters  Code Status :  Full  Disposition Plan  :  HHPT  Consults  :  None  Procedures  :  None  DVT Prophylaxis  :  Eliquis  Lab Results  Component Value Date   PLT 160 08/24/2016    Inpatient Medications  Scheduled Meds: . apixaban  5 mg Oral BID  . calcium-vitamin D  1 tablet Oral BID  . ferrous sulfate  325 mg Oral Q supper  . [START ON 08/28/2016] furosemide  40 mg Oral Daily  . insulin aspart  0-5 Units Subcutaneous QHS  . insulin aspart  0-9 Units Subcutaneous TID WC  . insulin aspart  3 Units Subcutaneous TID WC  . insulin glargine  40 Units Subcutaneous QHS  . levETIRAcetam  500 mg Oral BID  . mouth rinse  15 mL Mouth Rinse BID  . metoprolol tartrate  12.5 mg Oral BID  . pantoprazole  40 mg Oral q morning - 10a  . potassium chloride  40 mEq Oral BID   Continuous  Infusions:  PRN Meds:.acetaminophen, albuterol, HYDROcodone-acetaminophen, hydrOXYzine, naphazoline-pheniramine, ondansetron **OR** ondansetron (ZOFRAN) IV, polyethylene glycol  Antibiotics  :    Anti-infectives    Start     Dose/Rate Route Frequency Ordered Stop   08/27/16 0000  clindamycin (CLEOCIN) 300 MG capsule     300 mg Oral 3 times daily 08/27/16 0902     08/24/16 1830  vancomycin (VANCOCIN) 1,500 mg in sodium chloride 0.9 % 500 mL IVPB  Status:  Discontinued     1,500 mg 250 mL/hr over 120 Minutes Intravenous Every 24 hours 08/23/16 1750 08/27/16 1313   08/23/16 2200  meropenem (MERREM) 2 g in sodium chloride 0.9 % 100 mL IVPB  Status:  Discontinued     2 g 200 mL/hr over 30 Minutes Intravenous Every 12 hours 08/23/16 1750 08/27/16 1313   08/23/16 1800  vancomycin (VANCOCIN) 2,500 mg in sodium chloride 0.9 % 500 mL IVPB     2,500 mg 250 mL/hr over 120 Minutes Intravenous  Once 08/23/16 1750 08/23/16 2242   08/23/16 1630  doxycycline (VIBRAMYCIN) 100 mg in dextrose 5 % 250 mL IVPB  Status:  Discontinued     100 mg 125 mL/hr over 120 Minutes Intravenous  Once 08/23/16 1548 08/23/16 1830         Objective:   Vitals:   08/26/16 2127 08/27/16 0156 08/27/16 0455 08/27/16 1600  BP: 114/68  136/79 120/69  Pulse: 80  98 71  Resp: 18  18 18   Temp: 99.4 F (37.4 C)  98.2 F (36.8 C) 98.5 F (36.9 C)  TempSrc: Oral  Oral Oral  SpO2: 97%  100% 98%  Weight:  113.4 kg (250 lb)    Height:        Wt Readings from Last 3 Encounters:  08/27/16 113.4 kg (250 lb)  08/13/16 132.5 kg (292 lb 3.2 oz)  08/05/16 127 kg (280 lb)     Intake/Output Summary (Last 24 hours) at 08/27/16 1646 Last data filed at 08/27/16 1619  Gross per 24 hour  Intake              300 ml  Output             1600 ml  Net            -1300 ml     Physical Exam  Awake Alert, Oriented X 3, No new F.N deficits, Normal affect [place].AT,PERRAL Symmetrical Chest wall movement, Good air movement bilaterally,  CTAB RRR,No Gallops,Rubs or new Murmurs, No Parasternal Heave +ve B.Sounds, Abd Soft, No tenderness, No organomegaly appriciated, No rebound - guarding or rigidity. Leg and pedal edema improved, but area of edemaand erythema still over the left lateral part of the  thigh still persistent.     Data Review:    CBC  Recent Labs Lab 08/23/16 1635 08/24/16 0724  WBC 7.0 4.3  HGB 10.7* 10.4*  HCT 35.3* 35.3*  PLT 165 160  MCV 97.8 97.5  MCH 29.6 28.7  MCHC 30.3 29.5*  RDW 14.3 14.3  LYMPHSABS 0.7  --   MONOABS 0.2  --   EOSABS 0.0  --   BASOSABS 0.0  --     Chemistries   Recent Labs Lab 08/23/16 1635 08/24/16 0724 08/25/16 0326 08/26/16 0537 08/27/16 0839  NA 138 141 141 139 136  K 4.3 4.0 3.6 3.7 3.3*  CL 97* 98* 93* 90* 88*  CO2 34* 35* 39* 41* 38*  GLUCOSE 352* 213* 155* 138* 281*  BUN 13 13 15 14 18   CREATININE 1.09* 0.94 1.06* 1.13* 1.27*  CALCIUM 9.1 9.1 9.1 9.3 9.4  MG  --   --  1.6* 2.1  --   AST 14*  --   --   --   --   ALT 7*  --   --   --   --   ALKPHOS 47  --   --   --   --   BILITOT 0.6  --   --   --   --    ------------------------------------------------------------------------------------------------------------------ No results for input(s): CHOL, HDL, LDLCALC, TRIG, CHOLHDL, LDLDIRECT in the last 72 hours.  Lab Results  Component Value Date   HGBA1C 8.7 (H) 08/23/2016   ------------------------------------------------------------------------------------------------------------------ No results for input(s): TSH, T4TOTAL, T3FREE, THYROIDAB in the last 72 hours.  Invalid input(s): FREET3 ------------------------------------------------------------------------------------------------------------------ No results for input(s): VITAMINB12, FOLATE, FERRITIN, TIBC, IRON, RETICCTPCT in the last 72 hours.  Coagulation profile  Recent Labs Lab 08/23/16 1635  INR 1.07    No results for input(s): DDIMER in the last 72 hours.  Cardiac  Enzymes No results for input(s): CKMB, TROPONINI, MYOGLOBIN in the last 168 hours.  Invalid input(s): CK ------------------------------------------------------------------------------------------------------------------    Component Value Date/Time   BNP 186.5 (H) 08/10/2016 1228    Micro Results No results found for this or any previous visit (from the past 240 hour(s)).  Radiology Reports Portable Chest 1 View  Result Date: 08/11/2016 CLINICAL DATA:  Acute respiratory failure with hypoxia EXAM: PORTABLE CHEST 1 VIEW COMPARISON:  08/10/2016 FINDINGS: Cardiomegaly with vascular congestion. Low lung volumes. Small  right pleural effusion again noted. No confluent opacities or edema. IMPRESSION: Cardiomegaly with vascular congestion.  Stable small right effusion. Electronically Signed   By: Rolm Baptise M.D.   On: 08/11/2016 07:18   Dg Chest Portable 1 View  Result Date: 08/10/2016 CLINICAL DATA:  Shortness of breath and cellulitis EXAM: PORTABLE CHEST 1 VIEW COMPARISON:  08/10/2016 FINDINGS: Cardiac shadow remains enlarged. The lungs are well aerated bilaterally. Some blunting of the right costophrenic angle consistent with small effusion on the right is noted better visualize from the prior exam. No new focal infiltrate is seen. IMPRESSION: Small right effusion.  No other new focal abnormality is seen. Electronically Signed   By: Inez Catalina M.D.   On: 08/10/2016 19:22   Dg Chest Port 1 View  Result Date: 08/10/2016 CLINICAL DATA:  Tachycardia and lethargy.  Fever. EXAM: PORTABLE CHEST 1 VIEW COMPARISON:  July 13, 2016 FINDINGS: There is no edema or consolidation. Heart is enlarged with pulmonary vascularity within normal limits. No adenopathy. There is atherosclerotic calcification in the aorta. There is evidence of old trauma involving the left clavicle. IMPRESSION: Cardiomegaly. No edema or consolidation. There is aortic atherosclerosis. Electronically Signed   By: Lowella Grip III  M.D.   On: 08/10/2016 12:33    Time Spent in minutes  62   Enmanuel Zufall M.D on 08/27/2016 at 4:46 PM  Between 7am to 7pm - Pager - [phone] ( page via [facility], text pages only, please mention full 10 digit call back number). After 7pm go to [email or web address] - password [facility]

## 2016-08-27 NOTE — Progress Notes (Signed)
Unable to weigh patient due to the "air mattress" dysfunction. The bed wouldn't zero out to get an accurate weight of the patient.  Patient not ambulatory at this time.  Patient wanted to rest at this time instead of using the lift.

## 2016-08-28 LAB — BASIC METABOLIC PANEL
ANION GAP: 9 (ref 5–15)
BUN: 19 mg/dL (ref 6–20)
CALCIUM: 9.1 mg/dL (ref 8.9–10.3)
CO2: 38 mmol/L — ABNORMAL HIGH (ref 22–32)
Chloride: 90 mmol/L — ABNORMAL LOW (ref 101–111)
Creatinine, Ser: 1.22 mg/dL — ABNORMAL HIGH (ref 0.44–1.00)
GFR, EST AFRICAN AMERICAN: 44 mL/min — AB (ref >60)
GFR, EST NON AFRICAN AMERICAN: 38 mL/min — AB (ref >60)
GLUCOSE: 192 mg/dL — AB (ref 65–99)
POTASSIUM: 3.6 mmol/L (ref 3.5–5.1)
SODIUM: 137 mmol/L (ref 135–145)

## 2016-08-28 LAB — GLUCOSE, CAPILLARY
GLUCOSE-CAPILLARY: 181 mg/dL — AB (ref 65–99)
GLUCOSE-CAPILLARY: 251 mg/dL — AB (ref 65–99)

## 2016-08-28 MED ORDER — DOXYCYCLINE HYCLATE 100 MG PO TABS
100.0000 mg | ORAL_TABLET | Freq: Two times a day (BID) | ORAL | Status: DC
  Administered 2016-08-28: 100 mg via ORAL
  Filled 2016-08-28: qty 1

## 2016-08-28 MED ORDER — DOXYCYCLINE HYCLATE 100 MG PO TABS: 100.0000 mg | ORAL_TABLET | Freq: Two times a day (BID) | ORAL | 0 refills | Status: DC

## 2016-08-28 NOTE — Discharge Summary (Signed)
Physician Discharge Summary  Elizabeth Pugh QMV:[id number] DOB: [birth year] DOA: 08/23/2016  PCP: Elizabeth Fendt, MD  Admit date: 08/23/2016 Discharge date: 08/28/2016  Admitted From: Home Disposition:  HOme  Recommendations for Outpatient Follow-up:  1. Follow up with PCP in 1-2 weeks 2. Please obtain BMP/CBC in one week   Home Health:yes  Discharge Condition:stable.  CODE STATUS: full code Diet recommendation: Heart Healthy /  Brief/Interim Summary: DelseneHauseris a 83 y.o.female,History of morbid obesity, history of stroke now bedbound, chronic systolic and diastolic CHF EF 28% on 4 L nasal cannula oxygen, chronic atrial fibrillation on Eliquis, GERD, DM type 2 insulin-dependent, she was admitted for left leg cellulitis reoccurrence along with acute on chronic combined systolic and diastolic CHF.  Discharge Diagnoses:  Principal Problem:   Cellulitis of left lower extremity Active Problems:   DM (diabetes mellitus), type 2, uncontrolled with complications (HCC)   CVA, old, hemiparesis (HCC)   Chronic atrial fibrillation (HCC)   Acute on chronic diastolic (congestive) heart failure (HCC)   SIRS (systemic inflammatory response syndrome) ([place])   Morbid obesity with BMI of 45.0-49.9, adult (HCC)   Cellulitis of left leg  1. Recurrent left leg cellulitis.completed 5 days of IV vancomycin and meropenem, Remains afebrile and nonseptic, redness and tenderness improved.  Change to oral doxycycline in am for another 2 days to complete the course.  She has diuresed about 5 liters since admission.   2.Acute on chronic combined systolic diastolic CHF EF 41-32%. Continue beta blocker, started  diuresis with IV Lasix and Zaroxolyn, placed on salt and fluid restriction, added UNNA boots, foley placed 08-25-16 to monitor Urine output, monitor weight, BMP, she is on 4 L nasal cannula oxygen at baseline which will be continued as well along with breathing treatments if needed. She has  diuresed about 5 liters so far,but as her creatinine worsened with diuresis, we have cut back on lasix to 40 mg daily po starting tomorrow. Repeat bmp shows improvement. .  Potassium will be replaced.   3.GERD. Remains on PPI.  4.Morbid obesity with obstructive sleep apnea. Daily at bedtime CPAP and follow with PCP for weight loss.  5.Chronic atrial fibrillation, history of PE. [place] vasc 2 score of at least 4. Continue Lopressor along with Eliquis.  6.History of stroke. Supportive skin no acute issues  As she has been  bedbound.   7.DM type II. On Lantus and sliding scale, have adjusted Lantus dose to 40 U daily and added pre-meal NovoLog for better control  Recent Labs       Lab Results  Component Value Date   HGBA1C 8.7 (H) 08/23/2016     CBG (last 3)   Recent Labs (last 2 labs)    Recent Labs  08/26/16 2126 08/27/16 0757 08/27/16 1149  GLUCAP 184* 299* 232*     Resume SSI on discharge .   8. Acute kidney injurty: possibly from over diuresis. Stopped IV lasix, changed to po on discharge.  Repeat BMP in am is improving.   9. Hypokalemia: replaced.    Discharge Instructions  Discharge Instructions    (HEART FAILURE PATIENTS) Call MD:  Anytime you have any of the following symptoms: 1) 3 pound weight gain in 24 hours or 5 pounds in 1 week 2) shortness of breath, with or without a dry hacking cough 3) swelling in the hands, feet or stomach 4) if you have to sleep on extra pillows at night in order to breathe.    Complete by:  As  directed    Diet - low sodium heart healthy    Complete by:  As directed    Discharge instructions    Complete by:  As directed    Please follow up with PCP in one week.     Allergies as of 08/28/2016      Reactions   Penicillins Rash   Has patient had a PCN reaction causing immediate rash, facial/tongue/throat swelling, SOB or lightheadedness with hypotension: Yes Has patient had a PCN reaction causing severe rash  involving mucus membranes or skin necrosis: No Has patient had a PCN reaction that required hospitalization: No Has patient had a PCN reaction occurring within the last 10 years: No If all of the above answers are "NO", then may proceed with Cephalosporin use. *Tolerates cephalosporins   Erythromycin Itching, Other (See Comments)   Vaginal itching   Zithromax [azithromycin] Other (See Comments)   Caused a yeast infection      Medication List    TAKE these medications   acetaminophen 325 MG tablet Commonly known as:  TYLENOL Take 325-650 mg by mouth every 6 (six) hours as needed (for pain).   albuterol (2.5 MG/3ML) 0.083% nebulizer solution Commonly known as:  PROVENTIL Take 2.5 mg by nebulization every 6 (six) hours as needed for wheezing or shortness of breath.   albuterol 108 (90 Base) MCG/ACT inhaler Commonly known as:  PROVENTIL HFA;VENTOLIN HFA Inhale 1 puff into the lungs every 6 (six) hours as needed for wheezing or shortness of breath.   ALLERGY EYE OP Place 1 drop into both eyes every 8 (eight) hours as needed (For eye irritation.).   apixaban 5 MG Tabs tablet Commonly known as:  ELIQUIS Take 1 tablet (5 mg total) by mouth 2 (two) times daily.   CALCIUM 500+D 500-400 MG-UNIT tablet Generic drug:  calcium-vitamin D Take 1 tablet by mouth 2 (two) times daily.   clindamycin 300 MG capsule Commonly known as:  CLEOCIN Take 1 capsule (300 mg total) by mouth 3 (three) times daily.   dextromethorphan-guaiFENesin 30-600 MG 12hr tablet Commonly known as:  MUCINEX DM Take 1 tablet by mouth 2 (two) times daily as needed for cough.   docusate sodium 100 MG capsule Commonly known as:  COLACE Take 100 mg by mouth daily as needed for mild constipation.   doxycycline 100 MG tablet Commonly known as:  VIBRA-TABS Take 1 tablet (100 mg total) by mouth every 12 (twelve) hours.   ferrous sulfate 325 (65 FE) MG tablet Take 325 mg by mouth daily with supper.   furosemide 40  MG tablet Commonly known as:  LASIX Take 1.5 tablets (60 mg total) by mouth 2 (two) times daily. What changed:  how much to take  when to take this  additional instructions   HUMALOG KWIKPEN 100 UNIT/ML KiwkPen Generic drug:  insulin lispro Inject 2-8 Units into the skin 3 (three) times daily before meals. PER SLIDING SCALE (nothing if BGL is 150 or less): 0-150 = 0 units, 151-200 = 2 units, 201-250 = 4 units, 251-300 = 6 units, 301-350 = 8 units, 351-400 = 10 units, 400+ = 12 units and notify NP/MD   hydrOXYzine 25 MG tablet Commonly known as:  ATARAX/VISTARIL Take 25 mg by mouth every 8 (eight) hours as needed for itching.   insulin glargine 100 UNIT/ML injection Commonly known as:  LANTUS Inject 0.4 mLs (40 Units total) into the skin at bedtime. What changed:  how much to take   ipratropium-albuterol 0.5-2.5 (3) MG/3ML  Soln Commonly known as:  DUONEB Take 3 mLs by nebulization every 6 (six) hours as needed (for wheezing or shortness of breath).   levETIRAcetam 500 MG tablet Commonly known as:  KEPPRA Take 1 tablet (500 mg total) by mouth 2 (two) times daily.   metoprolol tartrate 25 MG tablet Commonly known as:  LOPRESSOR Take 0.5 tablets (12.5 mg total) by mouth 2 (two) times daily.   pantoprazole 40 MG tablet Commonly known as:  PROTONIX Take 1 tablet (40 mg total) by mouth 2 (two) times daily before a meal. What changed:  when to take this   polyethylene glycol packet Commonly known as:  MIRALAX / GLYCOLAX Take 17 g by mouth daily as needed for mild constipation.   potassium chloride SA 20 MEQ tablet Commonly known as:  K-DUR,KLOR-CON Take 1 tablet (20 mEq total) by mouth daily.   pregabalin 75 MG capsule Commonly known as:  LYRICA Take 75 mg by mouth at bedtime.   rOPINIRole 2 MG tablet Commonly known as:  REQUIP Take 1 tablet (2 mg total) by mouth 3 (three) times daily. What changed:  how much to take  when to take this   Vitamin D  (Ergocalciferol) 50000 units Caps capsule Commonly known as:  DRISDOL Take 50,000 Units by mouth every 7 (seven) days.   VOLTAREN 1 % Gel Generic drug:  diclofenac sodium Apply 1 application topically 4 (four) times daily as needed (For knee pain.).            Durable Medical Equipment        Start     Ordered   08/23/16 1953  For home use only DME Air overlay mattress  Once     08/23/16 1953     Follow-up Information    Elizabeth Fendt, MD. Schedule an appointment as soon as possible for a visit in 1 week(s).   Specialty:  Internal Medicine Why:  follow up post hospitalization.  Contact information: [place] [address] [phone]          Allergies  Allergen Reactions  . Penicillins Rash    Has patient had a PCN reaction causing immediate rash, facial/tongue/throat swelling, SOB or lightheadedness with hypotension: Yes Has patient had a PCN reaction causing severe rash involving mucus membranes or skin necrosis: No Has patient had a PCN reaction that required hospitalization: No Has patient had a PCN reaction occurring within the last 10 years: No If all of the above answers are "NO", then may proceed with Cephalosporin use. *Tolerates cephalosporins  . Erythromycin Itching and Other (See Comments)    Vaginal itching  . Zithromax [Azithromycin] Other (See Comments)    Caused a yeast infection     Consultations:  None.Marland Kitchen   Specify Physician/Group   Procedures/Studies: Portable Chest 1 View  Result Date: 08/11/2016 CLINICAL DATA:  Acute respiratory failure with hypoxia EXAM: PORTABLE CHEST 1 VIEW COMPARISON:  08/10/2016 FINDINGS: Cardiomegaly with vascular congestion. Low lung volumes. Small right pleural effusion again noted. No confluent opacities or edema. IMPRESSION: Cardiomegaly with vascular congestion.  Stable small right effusion. Electronically Signed   By: Rolm Baptise M.D.   On: 08/11/2016 07:18   Dg Chest Portable 1  View  Result Date: 08/10/2016 CLINICAL DATA:  Shortness of breath and cellulitis EXAM: PORTABLE CHEST 1 VIEW COMPARISON:  08/10/2016 FINDINGS: Cardiac shadow remains enlarged. The lungs are well aerated bilaterally. Some blunting of the right costophrenic angle consistent with small effusion on the right is noted  better visualize from the prior exam. No new focal infiltrate is seen. IMPRESSION: Small right effusion.  No other new focal abnormality is seen. Electronically Signed   By: Inez Catalina M.D.   On: 08/10/2016 19:22   Dg Chest Port 1 View  Result Date: 08/10/2016 CLINICAL DATA:  Tachycardia and lethargy.  Fever. EXAM: PORTABLE CHEST 1 VIEW COMPARISON:  July 13, 2016 FINDINGS: There is no edema or consolidation. Heart is enlarged with pulmonary vascularity within normal limits. No adenopathy. There is atherosclerotic calcification in the aorta. There is evidence of old trauma involving the left clavicle. IMPRESSION: Cardiomegaly. No edema or consolidation. There is aortic atherosclerosis. Electronically Signed   By: Lowella Grip III M.D.   On: 08/10/2016 12:33       Subjective: No new complaints.   Discharge Exam: Vitals:   08/27/16 2221 08/28/16 0503  BP: (!) 155/88 139/87  Pulse: (!) 104 96  Resp:  18  Temp: 99.1 F (37.3 C) 98.8 F (37.1 C)   Vitals:   08/27/16 2144 08/27/16 2221 08/28/16 0404 08/28/16 0503  BP: (!) 174/140 (!) 155/88  139/87  Pulse: 93 (!) 104  96  Resp: 18   18  Temp:  99.1 F (37.3 C)  98.8 F (37.1 C)  TempSrc:  Oral    SpO2: 100%   100%  Weight:   121.6 kg (268 lb)   Height:        General: Pt is alert, awake, not in acute distress Cardiovascular: RRR, S1/S2 +, no rubs, no gallops Respiratory: CTA bilaterally, no wheezing, no rhonchi Abdominal: Soft, NT, ND, bowel sounds + Extremities: no edema, no cyanosis    The results of significant diagnostics from this hospitalization (including imaging, microbiology, ancillary and laboratory)  are listed below for reference.     Microbiology: No results found for this or any previous visit (from the past 240 hour(s)).   Labs: BNP (last 3 results)  Recent Labs  06/15/16 2250 07/13/16 1457 08/10/16 1228  BNP 233.2* 253.3* 789.3*   Basic Metabolic Panel:  Recent Labs Lab 08/23/16 1635 08/24/16 0724 08/25/16 0326 08/26/16 0537 08/27/16 0839  NA 138 141 141 139 136  K 4.3 4.0 3.6 3.7 3.3*  CL 97* 98* 93* 90* 88*  CO2 34* 35* 39* 41* 38*  GLUCOSE 352* 213* 155* 138* 281*  BUN 13 13 15 14 18   CREATININE 1.09* 0.94 1.06* 1.13* 1.27*  CALCIUM 9.1 9.1 9.1 9.3 9.4  MG  --   --  1.6* 2.1  --    Liver Function Tests:  Recent Labs Lab 08/23/16 1635  AST 14*  ALT 7*  ALKPHOS 47  BILITOT 0.6  PROT 6.3*  ALBUMIN 3.3*   No results for input(s): LIPASE, AMYLASE in the last 168 hours. No results for input(s): AMMONIA in the last 168 hours. CBC:  Recent Labs Lab 08/23/16 1635 08/24/16 0724  WBC 7.0 4.3  NEUTROABS 6.0  --   HGB 10.7* 10.4*  HCT 35.3* 35.3*  MCV 97.8 97.5  PLT 165 160   Cardiac Enzymes: No results for input(s): CKTOTAL, CKMB, CKMBINDEX, TROPONINI in the last 168 hours. BNP: Invalid input(s): POCBNP CBG:  Recent Labs Lab 08/27/16 0757 08/27/16 1149 08/27/16 1653 08/27/16 2140 08/28/16 0735  GLUCAP 299* 232* 236* 225* 181*   D-Dimer No results for input(s): DDIMER in the last 72 hours. Hgb A1c No results for input(s): HGBA1C in the last 72 hours. Lipid Profile No results for input(s): CHOL, HDL, LDLCALC,  TRIG, CHOLHDL, LDLDIRECT in the last 72 hours. Thyroid function studies No results for input(s): TSH, T4TOTAL, T3FREE, THYROIDAB in the last 72 hours.  Invalid input(s): FREET3 Anemia work up No results for input(s): VITAMINB12, FOLATE, FERRITIN, TIBC, IRON, RETICCTPCT in the last 72 hours. Urinalysis    Component Value Date/Time   COLORURINE YELLOW 08/10/2016 1539   APPEARANCEUR CLOUDY (A) 08/10/2016 1539   LABSPEC  1.015 08/10/2016 1539   PHURINE 5.0 08/10/2016 1539   GLUCOSEU NEGATIVE 08/10/2016 1539   HGBUR MODERATE (A) 08/10/2016 1539   BILIRUBINUR NEGATIVE 08/10/2016 1539   KETONESUR NEGATIVE 08/10/2016 1539   PROTEINUR 30 (A) 08/10/2016 1539   UROBILINOGEN 1.0 01/19/2015 2241   NITRITE NEGATIVE 08/10/2016 1539   LEUKOCYTESUR NEGATIVE 08/10/2016 1539   Sepsis Labs Invalid input(s): PROCALCITONIN,  WBC,  LACTICIDVEN Microbiology No results found for this or any previous visit (from the past 240 hour(s)).   Time coordinating discharge: Over 30 minutes  SIGNED:   Hosie Poisson, MD  [facility] 08/28/2016, 8:59 AM Pager   If 7PM-7AM, please contact night-coverage [email or web address] Password [email or web address]

## 2016-08-28 NOTE — Progress Notes (Signed)
Patient was discharged home with home health by MD order; discharged instructions review and give to patient with care notes; IV DIC; patient will be transported home via [place].

## 2016-08-28 NOTE — Progress Notes (Signed)
Patient able to void once after Foley was D/C. Bladder scan showed less then 120cc. MD made aware. Patient will be D/C home with Home Health.

## 2016-08-28 NOTE — Progress Notes (Signed)
Foley Catheter was removed per MD order. Will monitor patient's ability to void.

## 2016-08-28 NOTE — Care Management Note (Signed)
Case Management Note  Patient Details  Name: Elizabeth Pugh MRN: [id number] Date of Birth: 17-May-1926  Subjective/Objective:                    Action/Plan: Plan is to d/c to home today. Pt will need transportation to home. CM to call [facility] to provide transportation to home.  Expected Discharge Date:  08/28/16               Expected Discharge Plan:  [place]  In-House Referral:  Clinical Social Work  Discharge planning Services  CM Consult  Post Acute Care Choice:  Resumption of Svcs/PTA Provider Choice offered to:  Patient  DME Arranged:    DME Agency:     [place]:  RN,PT,NA,SW [place] Agency:  [facility] ([facility] Referral made with [name] @ [phone]  Status of Service:    If discussed at [place] of Stay Meetings, dates discussed:    Additional Comments:  Sharin Mons, RN 08/28/2016, 9:26 AM

## 2016-08-31 ENCOUNTER — Inpatient Hospital Stay (HOSPITAL_COMMUNITY)
Admission: EM | Admit: 2016-08-31 | Discharge: 2016-09-07 | DRG: 602 | Disposition: A | Discharge status: Home Health | Payer: Medicare Other | Attending: Internal Medicine | Admitting: Internal Medicine

## 2016-08-31 ENCOUNTER — Emergency Department (HOSPITAL_COMMUNITY): Payer: Medicare Other

## 2016-08-31 DIAGNOSIS — IMO0002 Reserved for concepts with insufficient information to code with codable children: Secondary | ICD-10-CM | POA: Diagnosis present

## 2016-08-31 DIAGNOSIS — J9612 Chronic respiratory failure with hypercapnia: Secondary | ICD-10-CM | POA: Diagnosis present

## 2016-08-31 DIAGNOSIS — I5043 Acute on chronic combined systolic (congestive) and diastolic (congestive) heart failure: Secondary | ICD-10-CM | POA: Diagnosis present

## 2016-08-31 DIAGNOSIS — E662 Morbid (severe) obesity with alveolar hypoventilation: Secondary | ICD-10-CM | POA: Diagnosis present

## 2016-08-31 DIAGNOSIS — Z880 Allergy status to penicillin: Secondary | ICD-10-CM

## 2016-08-31 DIAGNOSIS — L03116 Cellulitis of left lower limb: Principal | ICD-10-CM | POA: Diagnosis present

## 2016-08-31 DIAGNOSIS — I69359 Hemiplegia and hemiparesis following cerebral infarction affecting unspecified side: Secondary | ICD-10-CM

## 2016-08-31 DIAGNOSIS — Z9119 Patient's noncompliance with other medical treatment and regimen: Secondary | ICD-10-CM

## 2016-08-31 DIAGNOSIS — M1990 Unspecified osteoarthritis, unspecified site: Secondary | ICD-10-CM | POA: Diagnosis present

## 2016-08-31 DIAGNOSIS — I5042 Chronic combined systolic (congestive) and diastolic (congestive) heart failure: Secondary | ICD-10-CM | POA: Diagnosis present

## 2016-08-31 DIAGNOSIS — I110 Hypertensive heart disease with heart failure: Secondary | ICD-10-CM | POA: Diagnosis present

## 2016-08-31 DIAGNOSIS — Z79899 Other long term (current) drug therapy: Secondary | ICD-10-CM

## 2016-08-31 DIAGNOSIS — E118 Type 2 diabetes mellitus with unspecified complications: Secondary | ICD-10-CM

## 2016-08-31 DIAGNOSIS — K219 Gastro-esophageal reflux disease without esophagitis: Secondary | ICD-10-CM | POA: Diagnosis present

## 2016-08-31 DIAGNOSIS — I482 Chronic atrial fibrillation: Secondary | ICD-10-CM | POA: Diagnosis present

## 2016-08-31 DIAGNOSIS — J9611 Chronic respiratory failure with hypoxia: Secondary | ICD-10-CM | POA: Diagnosis present

## 2016-08-31 DIAGNOSIS — Z7901 Long term (current) use of anticoagulants: Secondary | ICD-10-CM

## 2016-08-31 DIAGNOSIS — Z7401 Bed confinement status: Secondary | ICD-10-CM

## 2016-08-31 DIAGNOSIS — I10 Essential (primary) hypertension: Secondary | ICD-10-CM | POA: Diagnosis present

## 2016-08-31 DIAGNOSIS — E1165 Type 2 diabetes mellitus with hyperglycemia: Secondary | ICD-10-CM | POA: Diagnosis present

## 2016-08-31 DIAGNOSIS — G2581 Restless legs syndrome: Secondary | ICD-10-CM | POA: Diagnosis present

## 2016-08-31 DIAGNOSIS — Z794 Long term (current) use of insulin: Secondary | ICD-10-CM

## 2016-08-31 DIAGNOSIS — Z6841 Body Mass Index (BMI) 40.0 and over, adult: Secondary | ICD-10-CM

## 2016-08-31 DIAGNOSIS — E860 Dehydration: Secondary | ICD-10-CM

## 2016-08-31 DIAGNOSIS — R410 Disorientation, unspecified: Secondary | ICD-10-CM

## 2016-08-31 DIAGNOSIS — G9340 Encephalopathy, unspecified: Secondary | ICD-10-CM | POA: Diagnosis present

## 2016-08-31 DIAGNOSIS — Z86711 Personal history of pulmonary embolism: Secondary | ICD-10-CM

## 2016-08-31 DIAGNOSIS — Z881 Allergy status to other antibiotic agents status: Secondary | ICD-10-CM

## 2016-08-31 DIAGNOSIS — A419 Sepsis, unspecified organism: Secondary | ICD-10-CM | POA: Diagnosis not present

## 2016-08-31 DIAGNOSIS — R600 Localized edema: Secondary | ICD-10-CM

## 2016-08-31 DIAGNOSIS — I4891 Unspecified atrial fibrillation: Secondary | ICD-10-CM

## 2016-08-31 DIAGNOSIS — G92 Toxic encephalopathy: Secondary | ICD-10-CM | POA: Diagnosis present

## 2016-08-31 DIAGNOSIS — Z87891 Personal history of nicotine dependence: Secondary | ICD-10-CM

## 2016-08-31 DIAGNOSIS — F329 Major depressive disorder, single episode, unspecified: Secondary | ICD-10-CM | POA: Diagnosis present

## 2016-08-31 DIAGNOSIS — Z9981 Dependence on supplemental oxygen: Secondary | ICD-10-CM

## 2016-08-31 DIAGNOSIS — Z8673 Personal history of transient ischemic attack (TIA), and cerebral infarction without residual deficits: Secondary | ICD-10-CM

## 2016-08-31 DIAGNOSIS — F419 Anxiety disorder, unspecified: Secondary | ICD-10-CM | POA: Diagnosis present

## 2016-08-31 HISTORY — DX: Sepsis, unspecified organism: A41.9

## 2016-08-31 HISTORY — DX: Cutaneous abscess of left lower limb: L02.416

## 2016-08-31 HISTORY — DX: Chronic respiratory failure with hypercapnia: J96.12

## 2016-08-31 HISTORY — DX: Cellulitis of left lower limb: L03.116

## 2016-08-31 HISTORY — DX: Chronic respiratory failure with hypoxia: J96.11

## 2016-08-31 LAB — CBC WITH DIFFERENTIAL/PLATELET
BASOS ABS: 0 10*3/uL (ref 0.0–0.1)
BASOS PCT: 0 %
EOS ABS: 0 10*3/uL (ref 0.0–0.7)
EOS PCT: 0 %
HCT: 39 % (ref 36.0–46.0)
Hemoglobin: 11.7 g/dL — ABNORMAL LOW (ref 12.0–15.0)
Lymphocytes Relative: 21 %
Lymphs Abs: 1.1 10*3/uL (ref 0.7–4.0)
MCH: 28.9 pg (ref 26.0–34.0)
MCHC: 30 g/dL (ref 30.0–36.0)
MCV: 96.3 fL (ref 78.0–100.0)
MONO ABS: 0.4 10*3/uL (ref 0.1–1.0)
Monocytes Relative: 8 %
Neutro Abs: 3.9 10*3/uL (ref 1.7–7.7)
Neutrophils Relative %: 71 %
Platelets: 184 10*3/uL (ref 150–400)
RBC: 4.05 MIL/uL (ref 3.87–5.11)
RDW: 14.4 % (ref 11.5–15.5)
WBC: 5.5 10*3/uL (ref 4.0–10.5)

## 2016-08-31 LAB — I-STAT CG4 LACTIC ACID, ED: LACTIC ACID, VENOUS: 3.09 mmol/L — AB (ref 0.5–1.9)

## 2016-08-31 LAB — CBG MONITORING, ED: Glucose-Capillary: 185 mg/dL — ABNORMAL HIGH (ref 65–99)

## 2016-08-31 MED ORDER — SODIUM CHLORIDE 0.9 % IV BOLUS (SEPSIS): 1000.0000 mL | Freq: Once | INTRAVENOUS | Status: DC

## 2016-08-31 MED ORDER — LEVOFLOXACIN IN D5W 750 MG/150ML IV SOLN
750.0000 mg | Freq: Once | INTRAVENOUS | Status: AC
  Administered 2016-08-31: 750 mg via INTRAVENOUS
  Filled 2016-08-31: qty 150

## 2016-08-31 MED ORDER — VANCOMYCIN HCL 10 G IV SOLR
2000.0000 mg | Freq: Once | INTRAVENOUS | Status: AC
  Administered 2016-09-01: 2000 mg via INTRAVENOUS
  Filled 2016-08-31: qty 2000

## 2016-08-31 MED ORDER — VANCOMYCIN HCL IN DEXTROSE 1-5 GM/200ML-% IV SOLN: 1000.0000 mg | Freq: Once | INTRAVENOUS | Status: DC

## 2016-08-31 MED ORDER — ACETAMINOPHEN 650 MG RE SUPP
650.0000 mg | Freq: Once | RECTAL | Status: AC
  Administered 2016-08-31: 650 mg via RECTAL
  Filled 2016-08-31: qty 1

## 2016-08-31 MED ORDER — SODIUM CHLORIDE 0.9 % IV BOLUS (SEPSIS)
1000.0000 mL | Freq: Once | INTRAVENOUS | Status: AC
  Administered 2016-09-01: 1000 mL via INTRAVENOUS

## 2016-08-31 MED ORDER — DEXTROSE 5 % IV SOLN
2.0000 g | Freq: Once | INTRAVENOUS | Status: AC
  Administered 2016-09-01: 2 g via INTRAVENOUS
  Filled 2016-08-31: qty 2

## 2016-08-31 MED ORDER — SODIUM CHLORIDE 0.9 % IV BOLUS (SEPSIS)
1000.0000 mL | Freq: Once | INTRAVENOUS | Status: AC
  Administered 2016-08-31: 1000 mL via INTRAVENOUS

## 2016-08-31 NOTE — ED Notes (Signed)
Portable  Xray at the bedside.

## 2016-08-31 NOTE — ED Notes (Signed)
Iv team at bedside  

## 2016-08-31 NOTE — ED Provider Notes (Signed)
[facility] DEPT Provider Note   CSN: [id number] Arrival date & time: 08/31/16  2241   By signing my name below, I, Neta Mends, attest that this documentation has been prepared under the direction and in the presence of Ripley Fraise, MD . Electronically Signed: Neta Mends, ED Scribe. 08/31/2016. 11:21 PM.   History   Chief Complaint Chief Complaint  Patient presents with  . Altered Mental Status    LEVEL 5 CAVEAT DUE TO ALTERED MENTAL STATUS  The history is provided by the EMS personnel. The history is limited by the condition of the patient. No language interpreter was used.   HPI Comments:  Elizabeth Pugh is a 83 y.o. female with PMHx of CHF and Afib who presents to the Emergency Department via EMS here due to altered mental status. Per nurse, pt came via EMS after her daughter found her at home PTA on the ground with her NCO2 wrapped around forehead. Pt was febrile and SpO2 was 64% en route. Family reports that pt is confused normally, and states that she is breathing abnormally at this time. Pt uses at [facility] at home regularly, and daughter states that pt was active and acting normally yesterday. She was discharge 2 days ago for cellulitis and recently finished her course of antibiotics. Pt takes eliquis regularly.   Past Medical History:  Diagnosis Date  . Acute delirium 04/12/2011  . Acute kidney injury ([place]) 09/02/2015  . Anemia   . Anxiety   . Arthritis   . Atrial fibrillation ([place])   . Blood transfusion   . Bronchitis   . CHF (congestive heart failure) ([place])   . Diabetes mellitus   . GERD (gastroesophageal reflux disease)   . GI (gastrointestinal bleed)    Due to benign mass  . Hypertension   . Lobar pneumonia due to unspecified organism 09/02/2015  . On home oxygen therapy    "4L; 24/7" (12/11/2015)  . Restless leg syndrome   . Seizures ([place])   . Stroke [facility])     Patient Active Problem List   Diagnosis Date Noted  . Cellulitis of left leg  08/23/2016  . Sepsis ([place]) 08/10/2016  . Morbid obesity with BMI of 45.0-49.9, adult ([place]) 08/10/2016  . Cellulitis of left lower extremity 08/10/2016  . Acute respiratory failure with hypercapnia ([place]) 07/13/2016  . Acute respiratory failure with hypoxia and hypercapnia ([place]) 06/16/2016  . Urinary tract infection without hematuria 06/16/2016  . HCAP (healthcare-associated pneumonia) 06/16/2016  . Acute respiratory failure ([place]) 06/16/2016  . CAP (community acquired pneumonia) 06/09/2016  . Cellulitis 12/16/2015  . Confusion 12/11/2015  . SIRS (systemic inflammatory response syndrome) ([place]) 12/11/2015  . Chronic respiratory failure with hypoxia and hypercapnia ([place]) 09/28/2015  . Acute encephalopathy   . Acute on chronic congestive heart failure ([place])   . Needs sleep apnea assessment   . Acute on chronic diastolic (congestive) heart failure ([place]) 09/22/2015  . Acute kidney injury ([place]) 09/02/2015  . Lobar pneumonia due to unspecified organism 09/02/2015  . Bilateral leg edema   . Acute on chronic respiratory failure with hypoxia and hypercapnia ([place]) 08/29/2015  . History of pulmonary embolism 01/27/2015  . Chest pain at rest 01/27/2015  . Shortness of breath at rest 01/27/2015  . Chronic combined systolic and diastolic heart failure ([place]) 01/27/2015  . Thrombocytopenia ([place]) 01/27/2015  . Abnormal findings on radiological examination of gastrointestinal tract 01/27/2015  . Chronic atrial fibrillation ([place])   . Seizure disorder ([place])   . Anxiety   .  Anemia   . Restless leg syndrome   . Acute delirium 04/12/2011  . DM (diabetes mellitus), type 2, uncontrolled with complications ([place]) 49/44/9675  . CVA, old, hemiparesis ([place]) 03/25/2011  . Hypertension 03/25/2011    Past Surgical History:  Procedure Laterality Date  . CATARACT EXTRACTION W/ INTRAOCULAR LENS IMPLANT     "Dr. Gershon Crane; think they did the right one"  . EUS  09/24/2011   Procedure: UPPER ENDOSCOPIC ULTRASOUND  (EUS) LINEAR;  Surgeon: Beryle Beams, MD;  Location: [facility] END[facility];  Service: Endoscopy;  Laterality: N/A;  . TONSILLECTOMY  1935  . TUBAL LIGATION      OB History    No data available       Home Medications    Prior to Admission medications   Medication Sig Start Date End Date Taking? Authorizing Provider  acetaminophen (TYLENOL) 325 MG tablet Take 325-650 mg by mouth every 6 (six) hours as needed (for pain).    Historical Provider, MD  albuterol (PROVENTIL HFA;VENTOLIN HFA) 108 (90 BASE) MCG/ACT inhaler Inhale 1 puff into the lungs every 6 (six) hours as needed for wheezing or shortness of breath.    Historical Provider, MD  albuterol (PROVENTIL) (2.5 MG/3ML) 0.083% nebulizer solution Take 2.5 mg by nebulization every 6 (six) hours as needed for wheezing or shortness of breath.    Historical Provider, MD  apixaban (ELIQUIS) 5 MG TABS tablet Take 1 tablet (5 mg total) by mouth 2 (two) times daily. 09/27/15   Lavina Hamman, MD  calcium-vitamin D (CALCIUM 500+D) 500-400 MG-UNIT per tablet Take 1 tablet by mouth 2 (two) times daily.    Historical Provider, MD  dextromethorphan-guaiFENesin (MUCINEX DM) 30-600 MG 12hr tablet Take 1 tablet by mouth 2 (two) times daily as needed for cough.     Historical Provider, MD  docusate sodium (COLACE) 100 MG capsule Take 100 mg by mouth daily as needed for mild constipation.    Historical Provider, MD  doxycycline (VIBRA-TABS) 100 MG tablet Take 1 tablet (100 mg total) by mouth every 12 (twelve) hours. 08/28/16   Hosie Poisson, MD  ferrous sulfate 325 (65 FE) MG tablet Take 325 mg by mouth daily with supper.     Historical Provider, MD  furosemide (LASIX) 40 MG tablet Take 1.5 tablets (60 mg total) by mouth 2 (two) times daily. Patient taking differently: Take 20-40 mg by mouth See admin instructions. 40 mg in the morning and 20 mg in the evening 07/21/16   Verlee Monte, MD  hydrOXYzine (ATARAX/VISTARIL) 25 MG tablet Take 25 mg by mouth every 8 (eight)  hours as needed for itching.  05/10/16   Historical Provider, MD  insulin glargine (LANTUS) 100 UNIT/ML injection Inject 0.4 mLs (40 Units total) into the skin at bedtime. 08/27/16   Hosie Poisson, MD  insulin lispro (HUMALOG KWIKPEN) 100 UNIT/ML KiwkPen Inject 2-8 Units into the skin 3 (three) times daily before meals. PER SLIDING SCALE (nothing if BGL is 150 or less): 0-150 = 0 units, 151-200 = 2 units, 201-250 = 4 units, 251-300 = 6 units, 301-350 = 8 units, 351-400 = 10 units, 400+ = 12 units and notify NP/MD    Historical Provider, MD  ipratropium-albuterol (DUONEB) 0.5-2.5 (3) MG/3ML SOLN Take 3 mLs by nebulization every 6 (six) hours as needed (for wheezing or shortness of breath).     Historical Provider, MD  levETIRAcetam (KEPPRA) 500 MG tablet Take 1 tablet (500 mg total) by mouth 2 (two) times daily. 02/27/12   Gerald Stabs  Dorothey Baseman, MD  metoprolol tartrate (LOPRESSOR) 25 MG tablet Take 0.5 tablets (12.5 mg total) by mouth 2 (two) times daily. 06/23/16   Theodis Blaze, MD  Naphazoline-Pheniramine (ALLERGY EYE OP) Place 1 drop into both eyes every 8 (eight) hours as needed (For eye irritation.).     Historical Provider, MD  pantoprazole (PROTONIX) 40 MG tablet Take 1 tablet (40 mg total) by mouth 2 (two) times daily before a meal. Patient taking differently: Take 40 mg by mouth every morning.  10/01/15   Lavina Hamman, MD  polyethylene glycol (MIRALAX / Floria Raveling) packet Take 17 g by mouth daily as needed for mild constipation. 06/11/16   Mariel Aloe, MD  potassium chloride SA (K-DUR,KLOR-CON) 20 MEQ tablet Take 1 tablet (20 mEq total) by mouth daily. 07/21/16   Verlee Monte, MD  pregabalin (LYRICA) 75 MG capsule Take 75 mg by mouth at bedtime.     Historical Provider, MD  rOPINIRole (REQUIP) 2 MG tablet Take 1 tablet (2 mg total) by mouth 3 (three) times daily. Patient taking differently: Take 4 mg by mouth 2 (two) times daily.  09/27/15   Lavina Hamman, MD  Vitamin D, Ergocalciferol, (DRISDOL) 50000 units  CAPS capsule Take 50,000 Units by mouth every 7 (seven) days.     Historical Provider, MD  VOLTAREN 1 % GEL Apply 1 application topically 4 (four) times daily as needed (For knee pain.).  10/26/11   Historical Provider, MD    Family History Family History  Problem Relation Age of Onset  . Aneurysm Mother   . Heart attack Father   . Heart failure Sister   . Asthma Sister   . Kidney failure Sister   . Stroke Sister     Social History Social History  Substance Use Topics  . Smoking status: Former Smoker    Packs/day: 1.00    Years: 40.00    Types: Cigarettes    Quit date: 09/21/1985  . Smokeless tobacco: Never Used  . Alcohol use No     Allergies   Penicillins; Erythromycin; and Zithromax [azithromycin]   Review of Systems Review of Systems  Unable to perform ROS: Mental status change   LEVEL 5 CAVEAT DUE TO ALTERED MENTAL STATUS  Physical Exam Updated Vital Signs BP 104/64 (BP Location: Right Arm) Comment: Simultaneous filing. User may not have seen previous data.  Pulse 64 Comment: Simultaneous filing. User may not have seen previous data.  Temp (!) 102.2 F (39 C) (Oral)   Resp (!) 22   SpO2 93% Comment: Simultaneous filing. User may not have seen previous data.  Physical Exam CONSTITUTIONAL: Elderly and ill appearing HEAD: Normocephalic/atraumatic EYES: PERRL ENMT: Mucous membranes moist NECK: supple no meningeal signs SPINE/BACK:entire spine nontender CV: S1/S2 noted, no murmurs/rubs/gallops noted LUNGS: Coarse breath sounds bilaterally, tachypnea noted ABDOMEN: soft, nontender, no rebound or guarding, bowel sounds noted throughout abdomen GU: Diaper in place, no erythema noted to genitals NEURO: Pt unresponsive. Minimal response to voice and pain.   EXTREMITIES: pulses normal/equal, full ROM, chronic edema to lower extremities, no abscess noted, mild erythema noted to left thigh, no crepitus SKIN: warm, color normal PSYCH: Unable to assess   ED  Treatments / Results  DIAGNOSTIC STUDIES:  Oxygen Saturation is 98% on SPO2, normal by my interpretation.    COORDINATION OF CARE:  11:15 PM Discussed treatment plan with pt at bedside and pt agreed to plan.   Labs (all labs ordered are listed, but only abnormal results are  displayed) Labs Reviewed  COMPREHENSIVE METABOLIC PANEL - Abnormal; Notable for the following:       Result Value   Chloride 94 (*)    CO2 33 (*)    Glucose, Bld 206 (*)    BUN 28 (*)    Creatinine, Ser 1.32 (*)    ALT 12 (*)    GFR calc non Af Amer 35 (*)    GFR calc Af Amer 40 (*)    All other components within normal limits  CBC WITH DIFFERENTIAL/PLATELET - Abnormal; Notable for the following:    Hemoglobin 11.7 (*)    All other components within normal limits  URINALYSIS, ROUTINE W REFLEX MICROSCOPIC - Abnormal; Notable for the following:    APPearance HAZY (*)    Hgb urine dipstick LARGE (*)    Bacteria, UA RARE (*)    Squamous Epithelial / LPF 0-5 (*)    All other components within normal limits  I-STAT CG4 LACTIC ACID, ED - Abnormal; Notable for the following:    Lactic Acid, Venous 3.09 (*)    All other components within normal limits  CBG MONITORING, ED - Abnormal; Notable for the following:    Glucose-Capillary 185 (*)    All other components within normal limits  I-STAT ARTERIAL BLOOD GAS, ED - Abnormal; Notable for the following:    pCO2 arterial 69.7 (*)    Bicarbonate 38.7 (*)    Acid-Base Excess 11.0 (*)    All other components within normal limits  CULTURE, BLOOD (ROUTINE X 2)  CULTURE, BLOOD (ROUTINE X 2)  URINE CULTURE  PROTIME-INR  I-STAT CG4 LACTIC ACID, ED    EKG  EKG Interpretation  Date/Time:  Monday August 31 2016 22:58:31 EDT Ventricular Rate:  117 PR Interval:    QRS Duration: 155 QT Interval:  394 QTC Calculation: 491 R Axis:   -41 Text Interpretation:  Atrial fibrillation Ventricular premature complex Left bundle branch block Abnormal ekg Interpretation limited  secondary to artifact Confirmed by Christy Gentles  MD, Kimbely Whiteaker ([id number]) on 08/31/2016 11:11:13 PM       Radiology Dg Chest Port 1 View  Result Date: 08/31/2016 CLINICAL DATA:  Altered mental status.  Fever and dyspnea. EXAM: PORTABLE CHEST 1 VIEW COMPARISON:  08/11/2016 FINDINGS: Unchanged moderate cardiomegaly. Mild vascular prominence, stable and likely chronic. Mild pleural thickening in the lateral and apical regions bilaterally, chronic. No confluent airspace consolidation. IMPRESSION: Stable cardiomegaly. Stable mild vascular prominence. No consolidation or large effusion. Electronically Signed   By: Andreas Newport M.D.   On: 08/31/2016 23:58    Procedures Procedures   Medications Ordered in ED Medications  sodium chloride 0.9 % bolus 1,000 mL (0 mLs Intravenous Stopped 09/01/16 0107)    And  sodium chloride 0.9 % bolus 1,000 mL (1,000 mLs Intravenous New Bag/Given 09/01/16 0040)    And  sodium chloride 0.9 % bolus 1,000 mL (not administered)    And  sodium chloride 0.9 % bolus 1,000 mL (not administered)  vancomycin (VANCOCIN) 2,000 mg in sodium chloride 0.9 % 500 mL IVPB (2,000 mg Intravenous New Bag/Given 09/01/16 0147)  acetaminophen (TYLENOL) suppository 650 mg (650 mg Rectal Given 08/31/16 2330)  levofloxacin (LEVAQUIN) IVPB 750 mg (0 mg Intravenous Stopped 09/01/16 0148)  aztreonam (AZACTAM) 2 g in dextrose 5 % 50 mL IVPB (0 g Intravenous Stopped 09/01/16 0149)     Initial Impression / Assessment and Plan / ED Course  I have reviewed the triage vital signs and the nursing notes.  Pertinent labs &  imaging results that were available during my care of the patient were reviewed by me and considered in my medical decision making (see chart for details).     12:10 AM Pt now more alert BP improved She is not significantly hypercapneic Workup pending 1:11 AM  BMI >30 IDEAL BODY WEIGHT = 70KG TOTAL VOLUME - 2100ML 2:40 AM Pt stable She is awake/alert Denies HA No focal ABD  tenderness Will admit for fever.  Unclear etiology, though could be re-developing cellulitis on left thigh Low suspicion for meningitis at this time D/w dr Blaine Hamper for admission  Pt/family updated on plan Final Clinical Impressions(s) / ED Diagnoses   Final diagnoses:  Sepsis, due to unspecified organism [facility])  Dehydration  Delirium    New Prescriptions New Prescriptions   No medications on file  I personally performed the services described in this documentation, which was scribed in my presence. The recorded information has been reviewed and is accurate.        Ripley Fraise, MD 09/01/16 [phone]

## 2016-09-01 ENCOUNTER — Encounter (HOSPITAL_COMMUNITY): Payer: Self-pay | Admitting: General Practice

## 2016-09-01 ENCOUNTER — Inpatient Hospital Stay (HOSPITAL_COMMUNITY): Payer: Medicare Other

## 2016-09-01 DIAGNOSIS — J9612 Chronic respiratory failure with hypercapnia: Secondary | ICD-10-CM | POA: Diagnosis present

## 2016-09-01 DIAGNOSIS — A419 Sepsis, unspecified organism: Secondary | ICD-10-CM | POA: Diagnosis present

## 2016-09-01 DIAGNOSIS — L03116 Cellulitis of left lower limb: Principal | ICD-10-CM

## 2016-09-01 DIAGNOSIS — M1990 Unspecified osteoarthritis, unspecified site: Secondary | ICD-10-CM | POA: Diagnosis present

## 2016-09-01 DIAGNOSIS — R600 Localized edema: Secondary | ICD-10-CM | POA: Diagnosis not present

## 2016-09-01 DIAGNOSIS — I480 Paroxysmal atrial fibrillation: Secondary | ICD-10-CM | POA: Diagnosis not present

## 2016-09-01 DIAGNOSIS — Z86711 Personal history of pulmonary embolism: Secondary | ICD-10-CM | POA: Diagnosis not present

## 2016-09-01 DIAGNOSIS — Z87891 Personal history of nicotine dependence: Secondary | ICD-10-CM | POA: Diagnosis not present

## 2016-09-01 DIAGNOSIS — Z79899 Other long term (current) drug therapy: Secondary | ICD-10-CM | POA: Diagnosis not present

## 2016-09-01 DIAGNOSIS — G2581 Restless legs syndrome: Secondary | ICD-10-CM | POA: Diagnosis present

## 2016-09-01 DIAGNOSIS — J9611 Chronic respiratory failure with hypoxia: Secondary | ICD-10-CM

## 2016-09-01 DIAGNOSIS — E860 Dehydration: Secondary | ICD-10-CM | POA: Diagnosis present

## 2016-09-01 DIAGNOSIS — G9340 Encephalopathy, unspecified: Secondary | ICD-10-CM | POA: Diagnosis not present

## 2016-09-01 DIAGNOSIS — F419 Anxiety disorder, unspecified: Secondary | ICD-10-CM | POA: Diagnosis present

## 2016-09-01 DIAGNOSIS — I4891 Unspecified atrial fibrillation: Secondary | ICD-10-CM | POA: Diagnosis not present

## 2016-09-01 DIAGNOSIS — I69359 Hemiplegia and hemiparesis following cerebral infarction affecting unspecified side: Secondary | ICD-10-CM

## 2016-09-01 DIAGNOSIS — K219 Gastro-esophageal reflux disease without esophagitis: Secondary | ICD-10-CM | POA: Diagnosis present

## 2016-09-01 DIAGNOSIS — Z6841 Body Mass Index (BMI) 40.0 and over, adult: Secondary | ICD-10-CM

## 2016-09-01 DIAGNOSIS — I482 Chronic atrial fibrillation: Secondary | ICD-10-CM | POA: Diagnosis present

## 2016-09-01 DIAGNOSIS — Z880 Allergy status to penicillin: Secondary | ICD-10-CM | POA: Diagnosis not present

## 2016-09-01 DIAGNOSIS — Z7901 Long term (current) use of anticoagulants: Secondary | ICD-10-CM | POA: Diagnosis not present

## 2016-09-01 DIAGNOSIS — Z881 Allergy status to other antibiotic agents status: Secondary | ICD-10-CM | POA: Diagnosis not present

## 2016-09-01 DIAGNOSIS — F329 Major depressive disorder, single episode, unspecified: Secondary | ICD-10-CM | POA: Diagnosis present

## 2016-09-01 DIAGNOSIS — G92 Toxic encephalopathy: Secondary | ICD-10-CM | POA: Diagnosis present

## 2016-09-01 DIAGNOSIS — E1165 Type 2 diabetes mellitus with hyperglycemia: Secondary | ICD-10-CM | POA: Diagnosis present

## 2016-09-01 DIAGNOSIS — J9602 Acute respiratory failure with hypercapnia: Secondary | ICD-10-CM | POA: Diagnosis not present

## 2016-09-01 DIAGNOSIS — Z9981 Dependence on supplemental oxygen: Secondary | ICD-10-CM | POA: Diagnosis not present

## 2016-09-01 DIAGNOSIS — I110 Hypertensive heart disease with heart failure: Secondary | ICD-10-CM | POA: Diagnosis present

## 2016-09-01 DIAGNOSIS — E662 Morbid (severe) obesity with alveolar hypoventilation: Secondary | ICD-10-CM | POA: Diagnosis present

## 2016-09-01 DIAGNOSIS — I5042 Chronic combined systolic (congestive) and diastolic (congestive) heart failure: Secondary | ICD-10-CM | POA: Diagnosis not present

## 2016-09-01 DIAGNOSIS — J9601 Acute respiratory failure with hypoxia: Secondary | ICD-10-CM | POA: Diagnosis not present

## 2016-09-01 DIAGNOSIS — Z794 Long term (current) use of insulin: Secondary | ICD-10-CM | POA: Diagnosis not present

## 2016-09-01 DIAGNOSIS — E118 Type 2 diabetes mellitus with unspecified complications: Secondary | ICD-10-CM | POA: Diagnosis not present

## 2016-09-01 DIAGNOSIS — I5043 Acute on chronic combined systolic (congestive) and diastolic (congestive) heart failure: Secondary | ICD-10-CM | POA: Diagnosis present

## 2016-09-01 HISTORY — DX: Chronic respiratory failure with hypercapnia: J96.12

## 2016-09-01 HISTORY — DX: Sepsis, unspecified organism: A41.9

## 2016-09-01 HISTORY — DX: Chronic respiratory failure with hypoxia: J96.11

## 2016-09-01 LAB — CBC
HCT: 35 % — ABNORMAL LOW (ref 36.0–46.0)
Hemoglobin: 10.9 g/dL — ABNORMAL LOW (ref 12.0–15.0)
MCH: 29.9 pg (ref 26.0–34.0)
MCHC: 31.1 g/dL (ref 30.0–36.0)
MCV: 95.9 fL (ref 78.0–100.0)
Platelets: 154 10*3/uL (ref 150–400)
RBC: 3.65 MIL/uL — AB (ref 3.87–5.11)
RDW: 14.2 % (ref 11.5–15.5)
WBC: 5.8 10*3/uL (ref 4.0–10.5)

## 2016-09-01 LAB — PROTIME-INR
INR: 1.45
PROTHROMBIN TIME: 17.8 s — AB (ref 11.4–15.2)

## 2016-09-01 LAB — C-REACTIVE PROTEIN: CRP: 4 mg/dL — ABNORMAL HIGH (ref <1.0)

## 2016-09-01 LAB — I-STAT ARTERIAL BLOOD GAS, ED
ACID-BASE EXCESS: 11 mmol/L — AB (ref 0.0–2.0)
Bicarbonate: 38.7 mmol/L — ABNORMAL HIGH (ref 20.0–28.0)
O2 SAT: 94 %
PH ART: 7.362 (ref 7.350–7.450)
TCO2: 41 mmol/L (ref 0–100)
pCO2 arterial: 69.7 mmHg (ref 32.0–48.0)
pO2, Arterial: 87 mmHg (ref 83.0–108.0)

## 2016-09-01 LAB — GLUCOSE, CAPILLARY
GLUCOSE-CAPILLARY: 87 mg/dL (ref 65–99)
GLUCOSE-CAPILLARY: 60 mg/dL — AB (ref 65–99)
GLUCOSE-CAPILLARY: 127 mg/dL — AB (ref 65–99)
GLUCOSE-CAPILLARY: 126 mg/dL — AB (ref 65–99)
Glucose-Capillary: 141 mg/dL — ABNORMAL HIGH (ref 65–99)
Glucose-Capillary: 102 mg/dL — ABNORMAL HIGH (ref 65–99)

## 2016-09-01 LAB — URINALYSIS, ROUTINE W REFLEX MICROSCOPIC
Bilirubin Urine: NEGATIVE
GLUCOSE, UA: NEGATIVE mg/dL
Ketones, ur: NEGATIVE mg/dL
Leukocytes, UA: NEGATIVE
NITRITE: NEGATIVE
PH: 5 (ref 5.0–8.0)
Protein, ur: NEGATIVE mg/dL
SPECIFIC GRAVITY, URINE: 1.011 (ref 1.005–1.030)
WBC UA: NONE SEEN WBC/hpf (ref 0–5)

## 2016-09-01 LAB — SEDIMENTATION RATE: SED RATE: 29 mm/h — AB (ref 0–22)

## 2016-09-01 LAB — I-STAT CG4 LACTIC ACID, ED: LACTIC ACID, VENOUS: 2.2 mmol/L — AB (ref 0.5–1.9)

## 2016-09-01 LAB — BRAIN NATRIURETIC PEPTIDE: B Natriuretic Peptide: 187.2 pg/mL — ABNORMAL HIGH (ref 0.0–100.0)

## 2016-09-01 LAB — COMPREHENSIVE METABOLIC PANEL
ALT: 12 U/L — AB (ref 14–54)
AST: 22 U/L (ref 15–41)
Albumin: 3.6 g/dL (ref 3.5–5.0)
Alkaline Phosphatase: 52 U/L (ref 38–126)
Anion gap: 11 (ref 5–15)
BUN: 28 mg/dL — ABNORMAL HIGH (ref 6–20)
CHLORIDE: 94 mmol/L — AB (ref 101–111)
CO2: 33 mmol/L — AB (ref 22–32)
Calcium: 9.3 mg/dL (ref 8.9–10.3)
Creatinine, Ser: 1.32 mg/dL — ABNORMAL HIGH (ref 0.44–1.00)
GFR calc non Af Amer: 35 mL/min — ABNORMAL LOW (ref >60)
GFR, EST AFRICAN AMERICAN: 40 mL/min — AB (ref >60)
Glucose, Bld: 206 mg/dL — ABNORMAL HIGH (ref 65–99)
Potassium: 4.5 mmol/L (ref 3.5–5.1)
SODIUM: 138 mmol/L (ref 135–145)
Total Bilirubin: 1 mg/dL (ref 0.3–1.2)
Total Protein: 7.3 g/dL (ref 6.5–8.1)

## 2016-09-01 LAB — BASIC METABOLIC PANEL
Anion gap: 13 (ref 5–15)
BUN: 27 mg/dL — AB (ref 6–20)
CALCIUM: 8.6 mg/dL — AB (ref 8.9–10.3)
CO2: 27 mmol/L (ref 22–32)
CREATININE: 1.25 mg/dL — AB (ref 0.44–1.00)
Chloride: 100 mmol/L — ABNORMAL LOW (ref 101–111)
GFR, EST AFRICAN AMERICAN: 43 mL/min — AB (ref >60)
GFR, EST NON AFRICAN AMERICAN: 37 mL/min — AB (ref >60)
Glucose, Bld: 177 mg/dL — ABNORMAL HIGH (ref 65–99)
Potassium: 4.7 mmol/L (ref 3.5–5.1)
SODIUM: 140 mmol/L (ref 135–145)

## 2016-09-01 LAB — LACTIC ACID, PLASMA: LACTIC ACID, VENOUS: 2.4 mmol/L — AB (ref 0.5–1.9)

## 2016-09-01 LAB — PROCALCITONIN: PROCALCITONIN: 0.14 ng/mL

## 2016-09-01 LAB — AMMONIA: Ammonia: 38 umol/L — ABNORMAL HIGH (ref 9–35)

## 2016-09-01 MED ORDER — FLUCONAZOLE IN SODIUM CHLORIDE 400-0.9 MG/200ML-% IV SOLN
400.0000 mg | INTRAVENOUS | Status: AC
  Administered 2016-09-01: 400 mg via INTRAVENOUS
  Filled 2016-09-01: qty 200

## 2016-09-01 MED ORDER — APIXABAN 5 MG PO TABS
5.0000 mg | ORAL_TABLET | Freq: Two times a day (BID) | ORAL | Status: DC
  Administered 2016-09-01 – 2016-09-03 (×6): 5 mg via ORAL
  Filled 2016-09-01 (×7): qty 1

## 2016-09-01 MED ORDER — ALBUTEROL SULFATE (2.5 MG/3ML) 0.083% IN NEBU: 2.5000 mg | INHALATION_SOLUTION | RESPIRATORY_TRACT | Status: DC | PRN

## 2016-09-01 MED ORDER — METOPROLOL TARTRATE 5 MG/5ML IV SOLN
2.5000 mg | Freq: Three times a day (TID) | INTRAVENOUS | Status: DC
  Administered 2016-09-01 – 2016-09-07 (×19): 2.5 mg via INTRAVENOUS
  Filled 2016-09-01 (×19): qty 5

## 2016-09-01 MED ORDER — SODIUM CHLORIDE 0.9 % IV SOLN
500.0000 mg | Freq: Two times a day (BID) | INTRAVENOUS | Status: DC
  Administered 2016-09-01 (×2): 500 mg via INTRAVENOUS
  Filled 2016-09-01 (×3): qty 5

## 2016-09-01 MED ORDER — ACETAMINOPHEN 325 MG PO TABS
650.0000 mg | ORAL_TABLET | Freq: Four times a day (QID) | ORAL | Status: DC | PRN
  Administered 2016-09-04 – 2016-09-05 (×2): 650 mg via ORAL
  Filled 2016-09-01 (×2): qty 2

## 2016-09-01 MED ORDER — FAMOTIDINE IN NACL 20-0.9 MG/50ML-% IV SOLN
20.0000 mg | INTRAVENOUS | Status: DC
  Administered 2016-09-01: 20 mg via INTRAVENOUS
  Filled 2016-09-01 (×2): qty 50

## 2016-09-01 MED ORDER — DEXTROSE 5 % IV SOLN: 2.0000 g | Freq: Three times a day (TID) | INTRAVENOUS | Status: DC

## 2016-09-01 MED ORDER — IPRATROPIUM-ALBUTEROL 0.5-2.5 (3) MG/3ML IN SOLN: 3.0000 mL | RESPIRATORY_TRACT | Status: DC

## 2016-09-01 MED ORDER — DEXTROSE 5 % IV SOLN
1.0000 g | Freq: Three times a day (TID) | INTRAVENOUS | Status: DC
  Administered 2016-09-01: 1 g via INTRAVENOUS
  Filled 2016-09-01 (×2): qty 1

## 2016-09-01 MED ORDER — DEXTROSE 50 % IV SOLN
50.0000 mL | Freq: Once | INTRAVENOUS | Status: AC
  Administered 2016-09-01: 50 mL via INTRAVENOUS

## 2016-09-01 MED ORDER — INSULIN ASPART 100 UNIT/ML ~~LOC~~ SOLN
0.0000 [IU] | Freq: Three times a day (TID) | SUBCUTANEOUS | Status: DC
  Administered 2016-09-01 – 2016-09-02 (×2): 1 [IU] via SUBCUTANEOUS
  Administered 2016-09-02: 2 [IU] via SUBCUTANEOUS
  Administered 2016-09-03: 3 [IU] via SUBCUTANEOUS
  Administered 2016-09-03: 2 [IU] via SUBCUTANEOUS
  Administered 2016-09-04: 3 [IU] via SUBCUTANEOUS
  Administered 2016-09-04: 5 [IU] via SUBCUTANEOUS
  Administered 2016-09-04: 2 [IU] via SUBCUTANEOUS
  Administered 2016-09-05 – 2016-09-06 (×4): 3 [IU] via SUBCUTANEOUS
  Administered 2016-09-06: 1 [IU] via SUBCUTANEOUS
  Administered 2016-09-07: 5 [IU] via SUBCUTANEOUS

## 2016-09-01 MED ORDER — VANCOMYCIN HCL IN DEXTROSE 1-5 GM/200ML-% IV SOLN: 1000.0000 mg | INTRAVENOUS | Status: DC

## 2016-09-01 MED ORDER — SODIUM CHLORIDE 0.9 % IV SOLN
INTRAVENOUS | Status: DC
  Administered 2016-09-01 (×2): via INTRAVENOUS

## 2016-09-01 MED ORDER — INSULIN GLARGINE 100 UNIT/ML ~~LOC~~ SOLN
30.0000 [IU] | Freq: Every day | SUBCUTANEOUS | Status: DC
  Administered 2016-09-01 – 2016-09-04 (×4): 30 [IU] via SUBCUTANEOUS
  Filled 2016-09-01 (×4): qty 0.3

## 2016-09-01 MED ORDER — DEXTROSE 50 % IV SOLN
INTRAVENOUS | Status: AC
  Administered 2016-09-01: 50 mL
  Filled 2016-09-01: qty 50

## 2016-09-01 MED ORDER — ONDANSETRON HCL 4 MG/2ML IJ SOLN: 4.0000 mg | Freq: Four times a day (QID) | INTRAMUSCULAR | Status: DC | PRN

## 2016-09-01 MED ORDER — SODIUM CHLORIDE 0.9% FLUSH
3.0000 mL | Freq: Two times a day (BID) | INTRAVENOUS | Status: DC
  Administered 2016-09-01 (×2): 3 mL via INTRAVENOUS

## 2016-09-01 MED ORDER — ONDANSETRON HCL 4 MG PO TABS
4.0000 mg | ORAL_TABLET | Freq: Four times a day (QID) | ORAL | Status: DC | PRN
Start: 2016-09-01 — End: 2016-09-02

## 2016-09-01 MED ORDER — FLUCONAZOLE IN SODIUM CHLORIDE 200-0.9 MG/100ML-% IV SOLN
200.0000 mg | INTRAVENOUS | Status: DC
  Filled 2016-09-01: qty 100

## 2016-09-01 MED ORDER — DEXTROSE 5 % IV SOLN
2.0000 g | INTRAVENOUS | Status: DC
  Administered 2016-09-01: 2 g via INTRAVENOUS
  Filled 2016-09-01: qty 2

## 2016-09-01 MED ORDER — ACETAMINOPHEN 650 MG RE SUPP: 650.0000 mg | Freq: Four times a day (QID) | RECTAL | Status: DC | PRN

## 2016-09-01 MED ORDER — LEVOFLOXACIN IN D5W 750 MG/150ML IV SOLN: 750.0000 mg | INTRAVENOUS | Status: DC

## 2016-09-01 NOTE — H&P (Signed)
History and Physical    Elizabeth Pugh OAC:[id number] DOB: 09-03-1926 DOA: 08/31/2016  Referring MD/NP/PA:   PCP: Elizabeth Fendt, MD   Patient coming from:  The patient is coming from home.  At baseline, pt is independent for most of ADL.   Chief Complaint: AMS, fever and worsening left leg redness.  HPI: Elizabeth Pugh is a 83 y.o. female with medical history significant of morbid obesity, history of stroke now bedbound, chronic systolic and diastolic CHF EF 40 to 01% on 4 L nasal cannula oxygen and BiPAP at night, chronic atrial fibrillation on Eliquis, GERD, DM type 2 insulin-dependent, hypertension, GERD, depression, anxiety, seizure, RLS, GI bleeding, who presents with altered mental status, fever and worsening left leg redness.  Per pt's daughter, pt was recently hospitalized due to left leg cellulitis from 4/22-4/27. Patient was discharged on doxycycline. Per patient's daughter, she become confused this evening. She developed fever. Daughter states that patient's left leg seems to be more erythematous today. She has dry cough, not sure if she has any chest pain. No nausea, vomiting, diarrhea or abdominal pain noted per her daughter. Patient has chronic leg weakness due to previous stroke which has left changes. Per EMS, patient had an oxygen saturation 64% on room. Her oxygen saturation 93% in ED on 4L nasal cannula oxygen.  ED Course: pt was found to have WBC 5.5, lactate is 3.09, negative urinalysis, slightly worsening renal function, temperature 102.2, ABG with pH 7.362, PCO2 69.7 and PO2 87%. Chest x-rays negative for infiltration. Patient is admitted to telemetry bed as inpatient.  Review of Systems:  could not be reviewed due to altered mental status.  Allergy:  Allergies  Allergen Reactions  . Penicillins Rash    Has patient had a PCN reaction causing immediate rash, facial/tongue/throat swelling, SOB or lightheadedness with hypotension: Yes Has patient had a PCN reaction  causing severe rash involving mucus membranes or skin necrosis: No Has patient had a PCN reaction that required hospitalization: No Has patient had a PCN reaction occurring within the last 10 years: No If all of the above answers are "NO", then may proceed with Cephalosporin use. *Tolerates cephalosporins  . Erythromycin Itching and Other (See Comments)    Vaginal itching  . Zithromax [Azithromycin] Other (See Comments)    Caused a yeast infection     Past Medical History:  Diagnosis Date  . Acute delirium 04/12/2011  . Acute kidney injury ([place]) 09/02/2015  . Anemia   . Anxiety   . Arthritis   . Atrial fibrillation ([place])   . Blood transfusion   . Bronchitis   . CHF (congestive heart failure) ([place])   . Diabetes mellitus   . GERD (gastroesophageal reflux disease)   . GI (gastrointestinal bleed)    Due to benign mass  . Hypertension   . Lobar pneumonia due to unspecified organism 09/02/2015  . On home oxygen therapy    "4L; 24/7" (12/11/2015)  . Restless leg syndrome   . Seizures ([place])   . Stroke [facility])     Past Surgical History:  Procedure Laterality Date  . CATARACT EXTRACTION W/ INTRAOCULAR LENS IMPLANT     "Dr. Gershon Crane; think they did the right one"  . EUS  09/24/2011   Procedure: UPPER ENDOSCOPIC ULTRASOUND (EUS) LINEAR;  Surgeon: Beryle Beams, MD;  Location: [facility] ENDOSCOPY;  Service: Endoscopy;  Laterality: N/A;  . TONSILLECTOMY  1935  . TUBAL LIGATION      Social History:  reports that she  quit smoking about 30 years ago. Her smoking use included Cigarettes. She has a 40.00 pack-year smoking history. She has never used smokeless tobacco. She reports that she does not drink alcohol or use drugs.  Family History:  Family History  Problem Relation Age of Onset  . Aneurysm Mother   . Heart attack Father   . Heart failure Sister   . Asthma Sister   . Kidney failure Sister   . Stroke Sister      Prior to Admission medications   Medication Sig Start Date End Date  Taking? Authorizing Provider  acetaminophen (TYLENOL) 325 MG tablet Take 325-650 mg by mouth every 6 (six) hours as needed (for pain).    Historical Provider, MD  albuterol (PROVENTIL HFA;VENTOLIN HFA) 108 (90 BASE) MCG/ACT inhaler Inhale 1 puff into the lungs every 6 (six) hours as needed for wheezing or shortness of breath.    Historical Provider, MD  albuterol (PROVENTIL) (2.5 MG/3ML) 0.083% nebulizer solution Take 2.5 mg by nebulization every 6 (six) hours as needed for wheezing or shortness of breath.    Historical Provider, MD  apixaban (ELIQUIS) 5 MG TABS tablet Take 1 tablet (5 mg total) by mouth 2 (two) times daily. 09/27/15   Lavina Hamman, MD  calcium-vitamin D (CALCIUM 500+D) 500-400 MG-UNIT per tablet Take 1 tablet by mouth 2 (two) times daily.    Historical Provider, MD  dextromethorphan-guaiFENesin (MUCINEX DM) 30-600 MG 12hr tablet Take 1 tablet by mouth 2 (two) times daily as needed for cough.     Historical Provider, MD  docusate sodium (COLACE) 100 MG capsule Take 100 mg by mouth daily as needed for mild constipation.    Historical Provider, MD  doxycycline (VIBRA-TABS) 100 MG tablet Take 1 tablet (100 mg total) by mouth every 12 (twelve) hours. 08/28/16   Hosie Poisson, MD  ferrous sulfate 325 (65 FE) MG tablet Take 325 mg by mouth daily with supper.     Historical Provider, MD  furosemide (LASIX) 40 MG tablet Take 1.5 tablets (60 mg total) by mouth 2 (two) times daily. Patient taking differently: Take 20-40 mg by mouth See admin instructions. 40 mg in the morning and 20 mg in the evening 07/21/16   Verlee Monte, MD  hydrOXYzine (ATARAX/VISTARIL) 25 MG tablet Take 25 mg by mouth every 8 (eight) hours as needed for itching.  05/10/16   Historical Provider, MD  insulin glargine (LANTUS) 100 UNIT/ML injection Inject 0.4 mLs (40 Units total) into the skin at bedtime. 08/27/16   Hosie Poisson, MD  insulin lispro (HUMALOG KWIKPEN) 100 UNIT/ML KiwkPen Inject 2-8 Units into the skin 3 (three)  times daily before meals. PER SLIDING SCALE (nothing if BGL is 150 or less): 0-150 = 0 units, 151-200 = 2 units, 201-250 = 4 units, 251-300 = 6 units, 301-350 = 8 units, 351-400 = 10 units, 400+ = 12 units and notify NP/MD    Historical Provider, MD  ipratropium-albuterol (DUONEB) 0.5-2.5 (3) MG/3ML SOLN Take 3 mLs by nebulization every 6 (six) hours as needed (for wheezing or shortness of breath).     Historical Provider, MD  levETIRAcetam (KEPPRA) 500 MG tablet Take 1 tablet (500 mg total) by mouth 2 (two) times daily. 02/27/12   Monika Salk, MD  metoprolol tartrate (LOPRESSOR) 25 MG tablet Take 0.5 tablets (12.5 mg total) by mouth 2 (two) times daily. 06/23/16   Theodis Blaze, MD  Naphazoline-Pheniramine (ALLERGY EYE OP) Place 1 drop into both eyes every 8 (eight) hours  as needed (For eye irritation.).     Historical Provider, MD  pantoprazole (PROTONIX) 40 MG tablet Take 1 tablet (40 mg total) by mouth 2 (two) times daily before a meal. Patient taking differently: Take 40 mg by mouth every morning.  10/01/15   Lavina Hamman, MD  polyethylene glycol (MIRALAX / Floria Raveling) packet Take 17 g by mouth daily as needed for mild constipation. 06/11/16   Mariel Aloe, MD  potassium chloride SA (K-DUR,KLOR-CON) 20 MEQ tablet Take 1 tablet (20 mEq total) by mouth daily. 07/21/16   Verlee Monte, MD  pregabalin (LYRICA) 75 MG capsule Take 75 mg by mouth at bedtime.     Historical Provider, MD  rOPINIRole (REQUIP) 2 MG tablet Take 1 tablet (2 mg total) by mouth 3 (three) times daily. Patient taking differently: Take 4 mg by mouth 2 (two) times daily.  09/27/15   Lavina Hamman, MD  Vitamin D, Ergocalciferol, (DRISDOL) 50000 units CAPS capsule Take 50,000 Units by mouth every 7 (seven) days.     Historical Provider, MD  VOLTAREN 1 % GEL Apply 1 application topically 4 (four) times daily as needed (For knee pain.).  10/26/11   Historical Provider, MD    Physical Exam: Vitals:   09/01/16 0330 09/01/16 0345 09/01/16  0400 09/01/16 0415  BP: 116/77 115/64 137/83 115/72  Pulse: 100 (!) 106 (!) 102 (!) 106  Resp: (!) 23 (!) 21 18 (!) 25  Temp:      TempSrc:      SpO2: 100% 100% 100% 100%  Weight:      Height:       General: Not in acute distress HEENT:       Eyes: PERRL, EOMI, no scleral icterus.       ENT: No discharge from the ears and nose, no pharynx injection, no tonsillar enlargement.        Neck: No JVD, no bruit, no mass felt. Heme: No neck lymph node enlargement. Cardiac: S1/S2, RRR, No murmurs, No gallops or rubs. Respiratory: No rales, wheezing, rhonchi or rubs. GI: Soft, nondistended, nontender, no organomegaly, BS present. GU: No hematuria Ext: trace pitting leg edema bilaterally. 2+DP/PT pulse bilaterally. Left leg is warm and erythematous from her lower leg to upper inner thigh Musculoskeletal: No joint deformities, No joint redness or warmth, no limitation of ROM in spin. Skin: No rashes.  Neuro: not oriented X3, cranial nerves II-XII grossly intact. Psych: Patient is not psychotic. Labs on Admission: I have personally reviewed following labs and imaging studies  CBC:  Recent Labs Lab 08/31/16 2334  WBC 5.5  NEUTROABS 3.9  HGB 11.7*  HCT 39.0  MCV 96.3  PLT 332   Basic Metabolic Panel:  Recent Labs Lab 08/26/16 0537 08/27/16 0839 08/28/16 0811 08/31/16 2334  NA 139 136 137 138  K 3.7 3.3* 3.6 4.5  CL 90* 88* 90* 94*  CO2 41* 38* 38* 33*  GLUCOSE 138* 281* 192* 206*  BUN _0 28*  CREATININE 1.13* 1.27* 1.22* 1.32*  CALCIUM 9.3 9.4 9.1 9.3  MG 2.1  --   --   --    GFR: Estimated Creatinine Clearance: 38.7 mL/min (A) (by C-G formula based on SCr of 1.32 mg/dL (H)). Liver Function Tests:  Recent Labs Lab 08/31/16 2334  AST 22  ALT 12*  ALKPHOS 52  BILITOT 1.0  PROT 7.3  ALBUMIN 3.6   No results for input(s): LIPASE, AMYLASE in the last 168 hours. No results for input(s):  AMMONIA in the last 168 hours. Coagulation Profile:  Recent Labs Lab  09/01/16 0318  INR 1.45   Cardiac Enzymes: No results for input(s): CKTOTAL, CKMB, CKMBINDEX, TROPONINI in the last 168 hours. BNP (last 3 results) No results for input(s): PROBNP in the last 8760 hours. HbA1C: No results for input(s): HGBA1C in the last 72 hours. CBG:  Recent Labs Lab 08/27/16 1653 08/27/16 2140 08/28/16 0735 08/28/16 1214 08/31/16 2309  GLUCAP 236* 225* 181* 251* 185*   Lipid Profile: No results for input(s): CHOL, HDL, LDLCALC, TRIG, CHOLHDL, LDLDIRECT in the last 72 hours. Thyroid Function Tests: No results for input(s): TSH, T4TOTAL, FREET4, T3FREE, THYROIDAB in the last 72 hours. Anemia Panel: No results for input(s): VITAMINB12, FOLATE, FERRITIN, TIBC, IRON, RETICCTPCT in the last 72 hours. Urine analysis:    Component Value Date/Time   COLORURINE YELLOW 09/01/2016 0143   APPEARANCEUR HAZY (A) 09/01/2016 0143   LABSPEC 1.011 09/01/2016 0143   PHURINE 5.0 09/01/2016 0143   GLUCOSEU NEGATIVE 09/01/2016 0143   HGBUR LARGE (A) 09/01/2016 0143   BILIRUBINUR NEGATIVE 09/01/2016 0143   KETONESUR NEGATIVE 09/01/2016 0143   PROTEINUR NEGATIVE 09/01/2016 0143   UROBILINOGEN 1.0 01/19/2015 2241   NITRITE NEGATIVE 09/01/2016 0143   LEUKOCYTESUR NEGATIVE 09/01/2016 0143   Sepsis Labs: _0 (procalcitonin:4,lacticidven:4) )No results found for this or any previous visit (from the past 240 hour(s)).   Radiological Exams on Admission: Dg Chest Port 1 View  Result Date: 08/31/2016 CLINICAL DATA:  Altered mental status.  Fever and dyspnea. EXAM: PORTABLE CHEST 1 VIEW COMPARISON:  08/11/2016 FINDINGS: Unchanged moderate cardiomegaly. Mild vascular prominence, stable and likely chronic. Mild pleural thickening in the lateral and apical regions bilaterally, chronic. No confluent airspace consolidation. IMPRESSION: Stable cardiomegaly. Stable mild vascular prominence. No consolidation or large effusion. Electronically Signed   By: Andreas Newport M.D.   On:  08/31/2016 23:58     EKG: Independently reviewed.  A. fib, QTC 491, LAD   Assessment/Plan Principal Problem:   Sepsis ([place]) Active Problems:   DM (diabetes mellitus), type 2, uncontrolled with complications ([place])   CVA, old, hemiparesis ([place])   Hypertension   Atrial fibrillation (HCC)   Chronic combined systolic and diastolic heart failure (HCC)   Acute encephalopathy   Chronic respiratory failure with hypoxia and hypercapnia (HCC)   Morbid obesity with BMI of 45.0-49.9, adult (HCC)   Cellulitis of left lower extremity   Cellulitis of left lower extremity and sepsis: Patient's daughter states that the patient's left leg is more erythematous, indicating worsening infection. Patient meets criteria for sepsis with elevated lactate is 3.09, fever, and tachypnea. Currently hemodynamically stable.  - will admit to tele bed as in p;t - IV vancomycin, aztreonam and Levaquin were started in the ED, will continue.  - Blood cultures x 2  - ESR and CRP - will get Procalcitonin and trend lactic acid levels per sepsis protocol. - IVF: 2 L of NS bolus in ED, followed by 75 cc/h - LE doppler to r/o DVT  Acute encephalopathy: Likely due to sepsis. Acute delirium is also possible -Frequent neuro check -treat sepsis as above  DM-II: Last A1c 8.7 on 08/23/16, poorly fairly well controled. Patient is taking Lantus and Humalog at home -will decrease Lantus dose from 40 units to 30 units daily  -SSI  Hypertension: -hold oral meds -IV metoprolol 2.5 mg every 8 hours with holding parameters  Chronic combined systolic and diastolic heart failure ([place]): 2-D echo on 04/09/17 showed EF of 40-50 percent.  CHF seems to be compensated. -Hold Lasix due to worsening renal function and sepsis -Check BNP  chornic respiratory failure with hypoxia and hypercapnia ([place]): pt is on BiPAP at night at home -hold BiPAP due to AMS -prn albuterol nebs   CVA, old, hemiparesis ([place]): no acute issues. -pt is on  Eliquis for a fib. Will continue, but will start at 10:00, hoping pt's mental status improves by then, if mental status not improves by 10:00 AM, will need to hold Eliquis  Atrial Fibrillation: CHA2DS2-VASc Score is 8, needs oral anticoagulation. Patient is on Eliquis at home. Heart rate is well controlled. -continue Eliquis  -IV metoprolol as above  Seizure: -Seizure precaution -Switch oral to IV Keppra 500 mg twice a day   Depression and anxiety:  -hold hold medications due to possible polypharmacy   DVT ppx: on Eliquis Code Status: Full code Family Communication:  Yes, patient's daughter at bed side Disposition Plan:  Anticipate discharge back to previous home environment Consults called:  none Admission status: Inpatient/tele  Date of Service 09/01/2016    Ivor Costa [facility] Pager [phone]  If 7PM-7AM, please contact night-coverage [email or web address] Password [email or web address] 09/01/2016, 5:22 AM

## 2016-09-01 NOTE — Progress Notes (Signed)
Patient arrived in the unit accompanied by NT via stretcher. Telemetry box applied.

## 2016-09-01 NOTE — ED Notes (Signed)
Per Christy Gentles, MD, pt does not need 4000cc bolus. Pt only needs 2100cc fluids.

## 2016-09-01 NOTE — Progress Notes (Signed)
Patient still sleepy but easily arousable, oriented x4, sister at bedside. Place on Bipap by RT per MD order. I informed RT that we don't do Bipap here RT said this is a different kind of Bipap machine.

## 2016-09-01 NOTE — Progress Notes (Signed)
*  PRELIMINARY RESULTS* Vascular Ultrasound left lower extremity venous duplex has been completed.  Preliminary findings: Extremely difficult exam due to penetration.  No evidence of deep vein thrombosis the visualized veins of the left lower extremity.  Elizabeth Pugh 09/01/2016, 2:55 PM

## 2016-09-01 NOTE — Progress Notes (Signed)
CRITICAL VALUE ALERT  Critical value received:  Lactic acid  2.4  Date of notification:  09/01/16  Time of notification:  06:58  Critical value read back:Yes.    Nurse who received alert:  Rafael Bihari  MD notified (1st page):  Baltazar Najjar, MD  Time of first page:  06:59  MD notified (2nd page):Samtani, MD  Time of second page07:00 am  Responding MD:  Sammuel Hines  Time MD responded:  07:42 am

## 2016-09-01 NOTE — Progress Notes (Signed)
RT NOTE:  ABG on ISTAT results critical CO2. Results reported to Dr. Christy Gentles. No RT intervention @ this time.   Pt will need BIPAP QHS once transferred upstairs.

## 2016-09-01 NOTE — ED Notes (Signed)
[place] MD at bedside  

## 2016-09-01 NOTE — Progress Notes (Signed)
PROGRESS NOTE    Elizabeth Pugh  OJJ:[id number] DOB: 05/09/1926 DOA: 08/31/2016 PCP: Philis Fendt, MD  Outpatient Specialists:     Brief Narrative:  89 Body mass index is 43.82 kg/m.  OHSS/OSa on hs cpap Prior CVA Chr Afib CHad=8 on eliquis Restless leg syndrome chrbedbound state 2/2 to inoperable LLE # TCP Dm ty II known systolic + diastolic hf on chronic home oxygen--baseline weight 124 kg  Admit 4/9-4/13 with cellulitis Re-admit 4/22-4/27 with same issue Prior admissions in 07/2016 for acute resp failur eand 2 /2018 for pna and acute resp failure  Admit 09/01/16 am with toxic metabolic encephalopathy, Tmax 102.2 and potenttial recurrence of LE cellulitis/sepsis cxr neg   Assessment & Plan:   Principal Problem:   Sepsis ([place]) Active Problems:   DM (diabetes mellitus), type 2, uncontrolled with complications ([place])   CVA, old, hemiparesis ([place])   Hypertension   Atrial fibrillation ([place])   Chronic combined systolic and diastolic heart failure (HCC)   Acute encephalopathy   Chronic respiratory failure with hypoxia and hypercapnia (HCC)   Morbid obesity with BMI of 45.0-49.9, adult (HCC)   Cellulitis of left lower extremity   Very sleepy hasnt had bipap overnight   P get bipap on her now - give meds and food if more awake later continue Rx for cellulitis--de-escalating Get labs am Adding flconazole as wel given intertirginous infection and MASD Will need therapy eval eventually  D/w family  Rest as per Dr. Blaine Hamper  Objective: Vitals:   09/01/16 0345 09/01/16 0400 09/01/16 0415 09/01/16 0540  BP: 115/64 137/83 115/72 117/62  Pulse: (!) 106 (!) 102 (!) 106   Resp: (!) 21 18 (!) 25 (!) 24  Temp:    98.2 F (36.8 C)  TempSrc:    Oral  SpO2: 100% 100% 100% 99%  Weight:    126.9 kg (279 lb 12.8 oz)  Height:    5\' 7"  (1.702 m)    Intake/Output Summary (Last 24 hours) at 09/01/16 0740 Last data filed at 09/01/16 0310  Gross per 24 hour  Intake              1700 ml  Output                0 ml  Net             1700 ml   Filed Weights   09/01/16 0038 09/01/16 0540  Weight: 122.5 kg (270 lb) 126.9 kg (279 lb 12.8 oz)    Examination:  General exam: Appears calm and comfortable  Respiratory system: Clear to auscultation. Respiratory effort normal. Cardiovascular system: S1 & S2 heard, RRR. No JVD, murmurs, rubs, gallops or clicks. No pedal edema. Gastrointestinal system: Abdomen is nondistended, soft and nontender. No organomegaly or masses felt. Normal bowel sounds heard. Central nervous system: Alert and oriented. No focal neurological deficits. Extremities: Symmetric 5 x 5 power. Skin: No rashes, lesions or ulcers Psychiatry: Judgement and insight appear normal. Mood & affect appropriate.     Data Reviewed: I have personally reviewed following labs and imaging studies  CBC:  Recent Labs Lab 08/31/16 2334 09/01/16 0545  WBC 5.5 5.8  NEUTROABS 3.9  --   HGB 11.7* 10.9*  HCT 39.0 35.0*  MCV 96.3 95.9  PLT 184 937   Basic Metabolic Panel:  Recent Labs Lab 08/26/16 0537 08/27/16 0839 08/28/16 0811 08/31/16 2334 09/01/16 0545  NA 139 136 137 138 140  K 3.7 3.3* 3.6 4.5 4.7  CL 90* 88* 90* 94* 100*  CO2 41* 38* 38* 33* 27  GLUCOSE 138* 281* 192* 206* 177*  BUN 14 18 19  28* 27*  CREATININE 1.13* 1.27* 1.22* 1.32* 1.25*  CALCIUM 9.3 9.4 9.1 9.3 8.6*  MG 2.1  --   --   --   --    GFR: Estimated Creatinine Clearance: 43.1 mL/min (A) (by C-G formula based on SCr of 1.25 mg/dL (H)). Liver Function Tests:  Recent Labs Lab 08/31/16 2334  AST 22  ALT 12*  ALKPHOS 52  BILITOT 1.0  PROT 7.3  ALBUMIN 3.6   No results for input(s): LIPASE, AMYLASE in the last 168 hours. No results for input(s): AMMONIA in the last 168 hours. Coagulation Profile:  Recent Labs Lab 09/01/16 0318  INR 1.45   Cardiac Enzymes: No results for input(s): CKTOTAL, CKMB, CKMBINDEX, TROPONINI in the last 168 hours. BNP (last 3  results) No results for input(s): PROBNP in the last 8760 hours. HbA1C: No results for input(s): HGBA1C in the last 72 hours. CBG:  Recent Labs Lab 08/27/16 1653 08/27/16 2140 08/28/16 0735 08/28/16 1214 08/31/16 2309  GLUCAP 236* 225* 181* 251* 185*   Lipid Profile: No results for input(s): CHOL, HDL, LDLCALC, TRIG, CHOLHDL, LDLDIRECT in the last 72 hours. Thyroid Function Tests: No results for input(s): TSH, T4TOTAL, FREET4, T3FREE, THYROIDAB in the last 72 hours. Anemia Panel: No results for input(s): VITAMINB12, FOLATE, FERRITIN, TIBC, IRON, RETICCTPCT in the last 72 hours. Urine analysis:    Component Value Date/Time   COLORURINE YELLOW 09/01/2016 0143   APPEARANCEUR HAZY (A) 09/01/2016 0143   LABSPEC 1.011 09/01/2016 0143   PHURINE 5.0 09/01/2016 0143   GLUCOSEU NEGATIVE 09/01/2016 0143   HGBUR LARGE (A) 09/01/2016 0143   BILIRUBINUR NEGATIVE 09/01/2016 0143   KETONESUR NEGATIVE 09/01/2016 0143   PROTEINUR NEGATIVE 09/01/2016 0143   UROBILINOGEN 1.0 01/19/2015 2241   NITRITE NEGATIVE 09/01/2016 0143   LEUKOCYTESUR NEGATIVE 09/01/2016 0143   Sepsis Labs: @LABRCNTIP (procalcitonin:4,lacticidven:4)  )No results found for this or any previous visit (from the past 240 hour(s)).       Radiology Studies: Dg Chest Port 1 View  Result Date: 08/31/2016 CLINICAL DATA:  Altered mental status.  Fever and dyspnea. EXAM: PORTABLE CHEST 1 VIEW COMPARISON:  08/11/2016 FINDINGS: Unchanged moderate cardiomegaly. Mild vascular prominence, stable and likely chronic. Mild pleural thickening in the lateral and apical regions bilaterally, chronic. No confluent airspace consolidation. IMPRESSION: Stable cardiomegaly. Stable mild vascular prominence. No consolidation or large effusion. Electronically Signed   By: Andreas Newport M.D.   On: 08/31/2016 23:58        Scheduled Meds: . apixaban  5 mg Oral BID  . insulin aspart  0-9 Units Subcutaneous TID WC  . insulin glargine  30  Units Subcutaneous QHS  . metoprolol  2.5 mg Intravenous Q8H  . sodium chloride flush  3 mL Intravenous Q12H   Continuous Infusions: . sodium chloride 75 mL/hr at 09/01/16 0734  . aztreonam    . famotidine (PEPCID) IV    . levETIRAcetam 500 mg (09/01/16 0734)  . [START ON 09/03/2016] levofloxacin (LEVAQUIN) IV    . [START ON 09/03/2016] vancomycin       LOS: 0 days    Time spent: [place], MD [facility] ([facility]   If 7PM-7AM, please contact night-coverage [email or web address] Password [email or web address] 09/01/2016, 7:40 AM

## 2016-09-01 NOTE — Progress Notes (Signed)
Pharmacy Antibiotic Note  Elizabeth Pugh is a 83 y.o. female admitted on 08/31/2016 with AMS/fever/LLE cellulitis and possible sepsis.  Pharmacy has been consulted for Vancomycin, Aztreonam and Levaquin dosing.  Vancomycin 2 g IV given in ED at [place]: Vancomycin 1 g IV q48h Aztreonam 1 g IV q8h Levaquin 750 mg IV q48h   Height: 5\' 7"  (170.2 cm) Weight: 279 lb 12.8 oz (126.9 kg) IBW/kg (Calculated) : 61.6  Temp (24hrs), Avg:100.2 F (37.9 C), Min:98.2 F (36.8 C), Max:102.2 F (39 C)   Recent Labs Lab 08/26/16 0537 08/27/16 0839 08/28/16 0811 08/31/16 2334 08/31/16 2344 09/01/16 0249  WBC  --   --   --  5.5  --   --   CREATININE 1.13* 1.27* 1.22* 1.32*  --   --   LATICACIDVEN  --   --   --   --  3.09* 2.20*    Estimated Creatinine Clearance: 40.8 mL/min (A) (by C-G formula based on SCr of 1.32 mg/dL (H)).    Allergies  Allergen Reactions  . Penicillins Rash    Has patient had a PCN reaction causing immediate rash, facial/tongue/throat swelling, SOB or lightheadedness with hypotension: Yes Has patient had a PCN reaction causing severe rash involving mucus membranes or skin necrosis: No Has patient had a PCN reaction that required hospitalization: No Has patient had a PCN reaction occurring within the last 10 years: No If all of the above answers are "NO", then may proceed with Cephalosporin use. *Tolerates cephalosporins  . Erythromycin Itching and Other (See Comments)    Vaginal itching  . Zithromax [Azithromycin] Other (See Comments)    Caused a yeast infection     Caryl Pina 09/01/2016 5:48 AM

## 2016-09-01 NOTE — ED Notes (Signed)
Elevated Cg-4 reported to Dr. Kathrynn Humble

## 2016-09-01 NOTE — Progress Notes (Addendum)
Pharmacy Antibiotic Note  Elizabeth Pugh is a 83 y.o. female admitted on 08/31/2016 with AMS/fever/LLE cellulitis and possible sepsis.  Pharmacy has been consulted for Vancomycin and transition aztreonam/levaquin to cefepime dosing - noted allergy documented to PCN but has tolerated cefepime here multiple times in the past. Tmax/24h 102.2, WBC wnl, LA trend down 2.4, PCT 0.14. SCr 1.25 on admit, normalized CrCl~35.  Last dose of Levaquin at 2350 on 4/20, last aztreonam at 0831 this AM.  Pharmacy consulted to add fluconazole.  Plan: Continue vancomycin 1g IV q48h Cefepime 2g IV q24h to start tonight Fluconazole 400mg  IV x 1; then 200mg  IV q24h (50% dose reduction with CrCl<50) Monitor clinical progress, c/s, renal function F/u de-escalation plan/LOT, vancomycin trough as indicated    Height: 5\' 7"  (170.2 cm) Weight: 279 lb 12.8 oz (126.9 kg) IBW/kg (Calculated) : 61.6  Temp (24hrs), Avg:99.7 F (37.6 C), Min:98.2 F (36.8 C), Max:102.2 F (39 C)   Recent Labs Lab 08/26/16 0537 08/27/16 0839 08/28/16 0811 08/31/16 2334 08/31/16 2344 09/01/16 0249 09/01/16 0545 09/01/16 0602  WBC  --   --   --  5.5  --   --  5.8  --   CREATININE 1.13* 1.27* 1.22* 1.32*  --   --  1.25*  --   LATICACIDVEN  --   --   --   --  3.09* 2.20*  --  2.4*    Estimated Creatinine Clearance: 43.1 mL/min (A) (by C-G formula based on SCr of 1.25 mg/dL (H)).    Allergies  Allergen Reactions  . Penicillins Rash    Has patient had a PCN reaction causing immediate rash, facial/tongue/throat swelling, SOB or lightheadedness with hypotension: Yes Has patient had a PCN reaction causing severe rash involving mucus membranes or skin necrosis: No Has patient had a PCN reaction that required hospitalization: No Has patient had a PCN reaction occurring within the last 10 years: No If all of the above answers are "NO", then may proceed with Cephalosporin use. *Tolerates cephalosporins  . Erythromycin Itching  and Other (See Comments)    Vaginal itching  . Zithromax [Azithromycin] Other (See Comments)    Caused a yeast infection     Elicia Lamp, PharmD, BCPS Clinical Pharmacist 09/01/2016 11:31 AM

## 2016-09-01 NOTE — Progress Notes (Signed)
CBG 60, patient still sleepy but easily arousable. D50 given, MD made aware with orders , patient may have full liquids when fully awake.Will monitor accordingly.

## 2016-09-02 DIAGNOSIS — I480 Paroxysmal atrial fibrillation: Secondary | ICD-10-CM

## 2016-09-02 LAB — GLUCOSE, CAPILLARY
GLUCOSE-CAPILLARY: 129 mg/dL — AB (ref 65–99)
GLUCOSE-CAPILLARY: 180 mg/dL — AB (ref 65–99)
GLUCOSE-CAPILLARY: 138 mg/dL — AB (ref 65–99)
Glucose-Capillary: 97 mg/dL (ref 65–99)
Glucose-Capillary: 184 mg/dL — ABNORMAL HIGH (ref 65–99)

## 2016-09-02 LAB — COMPREHENSIVE METABOLIC PANEL
ALBUMIN: 2.7 g/dL — AB (ref 3.5–5.0)
ALK PHOS: 38 U/L (ref 38–126)
ALT: 10 U/L — AB (ref 14–54)
ANION GAP: 7 (ref 5–15)
AST: 16 U/L (ref 15–41)
BILIRUBIN TOTAL: 0.9 mg/dL (ref 0.3–1.2)
BUN: 21 mg/dL — AB (ref 6–20)
CALCIUM: 8.6 mg/dL — AB (ref 8.9–10.3)
CO2: 31 mmol/L (ref 22–32)
Chloride: 103 mmol/L (ref 101–111)
Creatinine, Ser: 1.06 mg/dL — ABNORMAL HIGH (ref 0.44–1.00)
GFR calc Af Amer: 53 mL/min — ABNORMAL LOW (ref >60)
GFR calc non Af Amer: 45 mL/min — ABNORMAL LOW (ref >60)
GLUCOSE: 94 mg/dL (ref 65–99)
Potassium: 3.8 mmol/L (ref 3.5–5.1)
Sodium: 141 mmol/L (ref 135–145)
TOTAL PROTEIN: 6.1 g/dL — AB (ref 6.5–8.1)

## 2016-09-02 LAB — URINE CULTURE: CULTURE: NO GROWTH

## 2016-09-02 MED ORDER — METOLAZONE 2.5 MG PO TABS
2.5000 mg | ORAL_TABLET | Freq: Once | ORAL | Status: AC
  Administered 2016-09-02: 2.5 mg via ORAL
  Filled 2016-09-02: qty 1

## 2016-09-02 MED ORDER — LEVETIRACETAM 500 MG PO TABS
500.0000 mg | ORAL_TABLET | Freq: Two times a day (BID) | ORAL | Status: DC
  Administered 2016-09-02 – 2016-09-07 (×11): 500 mg via ORAL
  Filled 2016-09-02 (×12): qty 1

## 2016-09-02 MED ORDER — POTASSIUM CHLORIDE CRYS ER 20 MEQ PO TBCR
40.0000 meq | EXTENDED_RELEASE_TABLET | Freq: Every day | ORAL | Status: AC
  Administered 2016-09-02 – 2016-09-03 (×2): 40 meq via ORAL
  Filled 2016-09-02 (×2): qty 2

## 2016-09-02 MED ORDER — PANTOPRAZOLE SODIUM 40 MG PO TBEC
40.0000 mg | DELAYED_RELEASE_TABLET | Freq: Every morning | ORAL | Status: DC
  Administered 2016-09-02 – 2016-09-07 (×6): 40 mg via ORAL
  Filled 2016-09-02 (×6): qty 1

## 2016-09-02 MED ORDER — VITAMIN D (ERGOCALCIFEROL) 1.25 MG (50000 UNIT) PO CAPS
50000.0000 [IU] | ORAL_CAPSULE | ORAL | Status: DC
  Administered 2016-09-02: 50000 [IU] via ORAL
  Filled 2016-09-02: qty 1

## 2016-09-02 MED ORDER — POLYETHYLENE GLYCOL 3350 17 G PO PACK: 17.0000 g | PACK | Freq: Every day | ORAL | Status: DC | PRN

## 2016-09-02 MED ORDER — FERROUS SULFATE 325 (65 FE) MG PO TABS
325.0000 mg | ORAL_TABLET | Freq: Every day | ORAL | Status: DC
  Administered 2016-09-02 – 2016-09-06 (×5): 325 mg via ORAL
  Filled 2016-09-02 (×5): qty 1

## 2016-09-02 MED ORDER — ROPINIROLE HCL 1 MG PO TABS
4.0000 mg | ORAL_TABLET | Freq: Two times a day (BID) | ORAL | Status: DC
  Administered 2016-09-02 – 2016-09-07 (×11): 4 mg via ORAL
  Filled 2016-09-02 (×11): qty 4

## 2016-09-02 MED ORDER — FUROSEMIDE 10 MG/ML IJ SOLN
40.0000 mg | Freq: Two times a day (BID) | INTRAMUSCULAR | Status: DC
  Administered 2016-09-02: 40 mg via INTRAVENOUS

## 2016-09-02 MED ORDER — POTASSIUM CHLORIDE CRYS ER 20 MEQ PO TBCR
20.0000 meq | EXTENDED_RELEASE_TABLET | Freq: Every day | ORAL | Status: DC
  Administered 2016-09-02 – 2016-09-04 (×3): 20 meq via ORAL
  Filled 2016-09-02 (×3): qty 1

## 2016-09-02 MED ORDER — CALCIUM CARBONATE-VITAMIN D 500-200 MG-UNIT PO TABS
1.0000 | ORAL_TABLET | Freq: Two times a day (BID) | ORAL | Status: DC
  Administered 2016-09-02 – 2016-09-07 (×11): 1 via ORAL
  Filled 2016-09-02 (×11): qty 1

## 2016-09-02 MED ORDER — FUROSEMIDE 10 MG/ML IJ SOLN
40.0000 mg | Freq: Three times a day (TID) | INTRAMUSCULAR | Status: DC
  Administered 2016-09-02 – 2016-09-03 (×5): 40 mg via INTRAVENOUS
  Filled 2016-09-02 (×6): qty 4

## 2016-09-02 MED ORDER — DOXYCYCLINE HYCLATE 100 MG PO TABS
100.0000 mg | ORAL_TABLET | Freq: Two times a day (BID) | ORAL | Status: DC
  Administered 2016-09-02 – 2016-09-07 (×11): 100 mg via ORAL
  Filled 2016-09-02 (×11): qty 1

## 2016-09-02 MED ORDER — DOCUSATE SODIUM 100 MG PO CAPS
200.0000 mg | ORAL_CAPSULE | Freq: Two times a day (BID) | ORAL | Status: DC
  Administered 2016-09-02 – 2016-09-07 (×11): 200 mg via ORAL
  Filled 2016-09-02 (×11): qty 2

## 2016-09-02 MED ORDER — CALCIUM CARBONATE-VITAMIN D 500-400 MG-UNIT PO TABS: 1.0000 | ORAL_TABLET | Freq: Two times a day (BID) | ORAL | Status: DC

## 2016-09-02 MED ORDER — PREGABALIN 75 MG PO CAPS
75.0000 mg | ORAL_CAPSULE | Freq: Every day | ORAL | Status: DC
  Administered 2016-09-02 – 2016-09-06 (×5): 75 mg via ORAL
  Filled 2016-09-02 (×5): qty 1

## 2016-09-02 NOTE — Progress Notes (Signed)
PROGRESS NOTE                                                                                                                                                                                                             Patient Demographics:    Elizabeth Pugh, is a 83 y.o. female, DOB - Sep 08, 1926, JME:[id number]  Admit date - 08/31/2016   Admitting Physician Ivor Costa, MD  Outpatient Primary MD for the patient is Philis Fendt, MD  LOS - 1  Chief Complaint  Patient presents with  . Altered Mental Status       Brief Narrative   Elizabeth Pugh  is a 83 y.o. 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       Subjective :    [name] today has, No headache, No chest pain, No abdominal pain - No Nausea, No new weakness tingling or numbness, No Cough - SOB.  Feels better.        Assessment  & Plan :     1. Fever upon admission unclear etiology, there has been persistent suspicion of recurrent cellulitis, however she has no leukocytosis, legs are not warm, she is completely afebrile at this time and nontoxic. She does have 2-3+ edema in both lower extremities up to abdominal wall and I think this is causing erythema and possibly intermittent low-grade cellulitis due to massive fluid collection. We'll raise Foley catheter, commence IV diuretics, she is probably 15 - 20 pounds fluid overload if not more. Stop all antibiotics, monitor cultures, no sepsis at this time.  2. Acute on chronic combined systolic diastolic CHF EF 97-98%. Continue beta blocker, placed on IV Lasix and Zaroxolyn, placed on salt and fluid restriction. Have added you and  then a bruits, monitor intake output and BMP closely.   3. GERD. On PPI  4. Morbid obesity with obstructive sleep apnea. Daily at bedtime CPAP not complaint, counseled and follow with PCP for weight loss.  5. Chronic atrial fibrillation, history of PE. [place] vasc 2 score of at least 4. Continue Lopressor along with Eliquis.  6. History of stroke. Supportive skin no acute issues she is now bedbound.   7. DM type II. On Lantus and sliding scale   Lab Results  Component Value Date   HGBA1C 8.7 (H) 08/23/2016   CBG (last 3)   Recent Labs  09/01/16 2157 09/02/16 0553 09/02/16 0727  GLUCAP 126* 129* 97      Diet : Diet full liquid Room service appropriate? Yes; Fluid consistency: Thin; Fluid restriction: 1500 mL Fluid    Family Communication  :  daughters  Code Status :  Full  Disposition Plan  :  HHPT  Consults  :  None  Procedures  :  None  DVT Prophylaxis  :  Eliquis  Lab Results  Component Value Date   PLT 154 09/01/2016    Inpatient Medications  Scheduled Meds: . apixaban  5 mg Oral BID  . calcium-vitamin D  1 tablet Oral BID  . docusate sodium  200 mg Oral BID  . doxycycline  100 mg Oral Q12H  . ferrous sulfate  325 mg Oral Q supper  . furosemide  40 mg Intravenous TID  . insulin aspart  0-9 Units Subcutaneous TID WC  . insulin glargine  30 Units Subcutaneous QHS  . levETIRAcetam  500 mg Oral BID  . metoprolol  2.5 mg Intravenous Q8H  . pantoprazole  40 mg Oral q morning - 10a  . potassium chloride SA  20 mEq Oral Daily  . potassium chloride  40 mEq Oral Daily  . pregabalin  75 mg Oral QHS  . rOPINIRole  4 mg Oral BID  . Vitamin D (Ergocalciferol)  50,000 Units Oral Q7 days   Continuous Infusions:  PRN Meds:.acetaminophen **OR** [DISCONTINUED] acetaminophen, albuterol, [DISCONTINUED] ondansetron **OR** ondansetron (ZOFRAN) IV, polyethylene glycol  Antibiotics  :    Anti-infectives    Start     Dose/Rate Route Frequency Ordered Stop    09/03/16 0800  levofloxacin (LEVAQUIN) IVPB 750 mg  Status:  Discontinued     750 mg 100 mL/hr over 90 Minutes Intravenous Every 48 hours 09/01/16 0555 09/01/16 1129   09/03/16 0600  vancomycin (VANCOCIN) IVPB 1000 mg/200 mL premix  Status:  Discontinued     1,000 mg 200 mL/hr over 60 Minutes Intravenous Every 48 hours 09/01/16 0555 09/02/16 0841   09/02/16 1400  fluconazole (DIFLUCAN) IVPB 200 mg  Status:  Discontinued     200 mg 100 mL/hr over 60 Minutes Intravenous Every 24 hours 09/01/16 1601 09/02/16 0850   09/02/16 1000  doxycycline (VIBRA-TABS) tablet 100 mg     100 mg Oral Every 12 hours 09/02/16 0843     09/01/16 2330  ceFEPIme (MAXIPIME) 2 g in dextrose 5 % 50 mL IVPB  Status:  Discontinued     2 g 100 mL/hr over 30 Minutes Intravenous Every 8 hours 09/01/16 1130 09/01/16 1131   09/01/16 2330  ceFEPIme (MAXIPIME) 2 g in dextrose 5 % 50 mL IVPB  Status:  Discontinued     2 g 100 mL/hr over 30 Minutes Intravenous Every 24 hours 09/01/16 1131 09/02/16 0841   09/01/16 1345  fluconazole (DIFLUCAN) IVPB 400 mg     400 mg 100 mL/hr over 120 Minutes Intravenous Every 24 hours 09/01/16 1338 09/01/16 1618   09/01/16 0800  aztreonam (AZACTAM) 1 g in dextrose 5 % 50 mL IVPB  Status:  Discontinued     1 g 100 mL/hr over 30 Minutes Intravenous Every 8 hours 09/01/16 0555 09/01/16 1129   08/31/16  2359  vancomycin (VANCOCIN) 2,000 mg in sodium chloride 0.9 % 500 mL IVPB     2,000 mg 250 mL/hr over 120 Minutes Intravenous  Once 08/31/16 2328 09/01/16 0310   08/31/16 2330  levofloxacin (LEVAQUIN) IVPB 750 mg     750 mg 100 mL/hr over 90 Minutes Intravenous  Once 08/31/16 2324 09/01/16 0148   08/31/16 2330  aztreonam (AZACTAM) 2 g in dextrose 5 % 50 mL IVPB     2 g 100 mL/hr over 30 Minutes Intravenous  Once 08/31/16 2324 09/01/16 0149   08/31/16 2330  vancomycin (VANCOCIN) IVPB 1000 mg/200 mL premix  Status:  Discontinued     1,000 mg 200 mL/hr over 60 Minutes Intravenous  Once 08/31/16  2324 08/31/16 2328         Objective:   Vitals:   09/02/16 0006 09/02/16 0512 09/02/16 0554 09/02/16 0757  BP: 117/69 133/69    Pulse: 65 85    Resp: 20 18    Temp: 98.5 F (36.9 C) 98 F (36.7 C)    TempSrc: Oral Oral    SpO2: 98% 100%    Weight:   130.1 kg (286 lb 14.4 oz) 129.3 kg (285 lb)  Height:        Wt Readings from Last 3 Encounters:  09/02/16 129.3 kg (285 lb)  08/28/16 121.6 kg (268 lb)  08/13/16 132.5 kg (292 lb 3.2 oz)     Intake/Output Summary (Last 24 hours) at 09/02/16 1031 Last data filed at 09/02/16 0935  Gross per 24 hour  Intake           3240.5 ml  Output                0 ml  Net           3240.5 ml     Physical Exam  Awake Alert, Oriented X 3, No new F.N deficits, Normal affect [place].AT,PERRAL Supple Neck,No JVD, No cervical lymphadenopathy appriciated.  Symmetrical Chest wall movement, Good air movement bilaterally, CTAB RRR,No Gallops,Rubs or new Murmurs, No Parasternal Heave +ve B.Sounds, Abd Soft, No tenderness, No organomegaly appriciated, No rebound - guarding or rigidity. No Cyanosis, Clubbing or edema, No new Rash or bruise 2+ bilateral lower extremity edema up to abdominal wall, no erythema or warmth    Data Review:    CBC  Recent Labs Lab 08/31/16 2334 09/01/16 0545  WBC 5.5 5.8  HGB 11.7* 10.9*  HCT 39.0 35.0*  PLT 184 154  MCV 96.3 95.9  MCH 28.9 29.9  MCHC 30.0 31.1  RDW 14.4 14.2  LYMPHSABS 1.1  --   MONOABS 0.4  --   EOSABS 0.0  --   BASOSABS 0.0  --     Chemistries   Recent Labs Lab 08/27/16 0839 08/28/16 0811 08/31/16 2334 09/01/16 0545 09/02/16 0731  NA 136 137 138 140 141  K 3.3* 3.6 4.5 4.7 3.8  CL 88* 90* 94* 100* 103  CO2 38* 38* 33* 27 31  GLUCOSE 281* 192* 206* 177* 94  BUN 18 19 28* 27* 21*  CREATININE 1.27* 1.22* 1.32* 1.25* 1.06*  CALCIUM 9.4 9.1 9.3 8.6* 8.6*  AST  --   --  22  --  16  ALT  --   --  12*  --  10*  ALKPHOS  --   --  52  --  38  BILITOT  --   --  1.0  --  0.9    ------------------------------------------------------------------------------------------------------------------ No results  for input(s): CHOL, HDL, LDLCALC, TRIG, CHOLHDL, LDLDIRECT in the last 72 hours.  Lab Results  Component Value Date   HGBA1C 8.7 (H) 08/23/2016   ------------------------------------------------------------------------------------------------------------------ No results for input(s): TSH, T4TOTAL, T3FREE, THYROIDAB in the last 72 hours.  Invalid input(s): FREET3 ------------------------------------------------------------------------------------------------------------------ No results for input(s): VITAMINB12, FOLATE, FERRITIN, TIBC, IRON, RETICCTPCT in the last 72 hours.  Coagulation profile  Recent Labs Lab 09/01/16 0318  INR 1.45    No results for input(s): DDIMER in the last 72 hours.  Cardiac Enzymes No results for input(s): CKMB, TROPONINI, MYOGLOBIN in the last 168 hours.  Invalid input(s): CK ------------------------------------------------------------------------------------------------------------------    Component Value Date/Time   BNP 187.2 (H) 09/01/2016 0602    Micro Results Recent Results (from the past 240 hour(s))  Urine culture     Status: None   Collection Time: 09/01/16  1:43 AM  Result Value Ref Range Status   Specimen Description URINE, RANDOM  Final   Special Requests NONE  Final   Culture NO GROWTH  Final   Report Status 09/02/2016 FINAL  Final    Radiology Reports Dg Chest Port 1 View  Result Date: 08/31/2016 CLINICAL DATA:  Altered mental status.  Fever and dyspnea. EXAM: PORTABLE CHEST 1 VIEW COMPARISON:  08/11/2016 FINDINGS: Unchanged moderate cardiomegaly. Mild vascular prominence, stable and likely chronic. Mild pleural thickening in the lateral and apical regions bilaterally, chronic. No confluent airspace consolidation. IMPRESSION: Stable cardiomegaly. Stable mild vascular prominence. No consolidation or  large effusion. Electronically Signed   By: Andreas Newport M.D.   On: 08/31/2016 23:58   Portable Chest 1 View  Result Date: 08/11/2016 CLINICAL DATA:  Acute respiratory failure with hypoxia EXAM: PORTABLE CHEST 1 VIEW COMPARISON:  08/10/2016 FINDINGS: Cardiomegaly with vascular congestion. Low lung volumes. Small right pleural effusion again noted. No confluent opacities or edema. IMPRESSION: Cardiomegaly with vascular congestion.  Stable small right effusion. Electronically Signed   By: Rolm Baptise M.D.   On: 08/11/2016 07:18   Dg Chest Portable 1 View  Result Date: 08/10/2016 CLINICAL DATA:  Shortness of breath and cellulitis EXAM: PORTABLE CHEST 1 VIEW COMPARISON:  08/10/2016 FINDINGS: Cardiac shadow remains enlarged. The lungs are well aerated bilaterally. Some blunting of the right costophrenic angle consistent with small effusion on the right is noted better visualize from the prior exam. No new focal infiltrate is seen. IMPRESSION: Small right effusion.  No other new focal abnormality is seen. Electronically Signed   By: Inez Catalina M.D.   On: 08/10/2016 19:22   Dg Chest Port 1 View  Result Date: 08/10/2016 CLINICAL DATA:  Tachycardia and lethargy.  Fever. EXAM: PORTABLE CHEST 1 VIEW COMPARISON:  July 13, 2016 FINDINGS: There is no edema or consolidation. Heart is enlarged with pulmonary vascularity within normal limits. No adenopathy. There is atherosclerotic calcification in the aorta. There is evidence of old trauma involving the left clavicle. IMPRESSION: Cardiomegaly. No edema or consolidation. There is aortic atherosclerosis. Electronically Signed   By: Lowella Grip III M.D.   On: 08/10/2016 12:33    Time Spent in minutes  30   Lala Lund M.D on 09/02/2016 at 10:31 AM  Between 7am to 7pm - Pager - [phone] ( page via [facility], text pages only, please mention full 10 digit call back number). After 7pm go to [email or web address] - password [facility]

## 2016-09-02 NOTE — Progress Notes (Signed)
Orthopedic Tech Progress Note Patient Details:  Elizabeth Pugh Apr 07, 1927 [id number]  Ortho Devices Type of Ortho Device: Louretta Parma boot Ortho Device/Splint Location: Bilateral unna boots Ortho Device/Splint Interventions: Application   [name] 09/02/2016, 9:54 AM

## 2016-09-02 NOTE — Progress Notes (Signed)
Patient has CPAP in room but is very confused and cannot demonstrate how to take off CPAP if something happens. RT will not place CPAP on patient at this time. RT will continue to monitor.

## 2016-09-02 NOTE — Progress Notes (Signed)
Patient refusing bipap, turned oxygen on 4L via nasal cannula.

## 2016-09-02 NOTE — Progress Notes (Signed)
Inpatient Diabetes Program Recommendations  AACE/ADA: New Consensus Statement on Inpatient Glycemic Control (2015)  Target Ranges:  Prepandial:   less than 140 mg/dL      Peak postprandial:   less than 180 mg/dL (1-2 hours)      Critically ill patients:  140 - 180 mg/dL   Lab Results  Component Value Date   GLUCAP 97 09/02/2016   HGBA1C 8.7 (H) 08/23/2016    Review of Glycemic Control:  Results for Elizabeth Pugh, Elizabeth Pugh (MRN [id number]) as of 09/02/2016 10:18  Ref. Range 09/01/2016 17:14 09/01/2016 18:09 09/01/2016 21:27 09/01/2016 21:57 09/02/2016 05:53 09/02/2016 07:27  Glucose-Capillary Latest Ref Range: 65 - 99 mg/dL 60 (L) 127 (H) 102 (H) 126 (H) 129 (H) 97   Inpatient Diabetes Program Recommendations:    Consider reducing Lantus to 20 units daily.   Thanks, Adah Perl, RN, BC-ADM Inpatient Diabetes Coordinator Pager [phone] (8a-5p)

## 2016-09-02 NOTE — Evaluation (Signed)
Physical Therapy Evaluation Patient Details Name: Elizabeth Pugh MRN: [id number] DOB: Apr 16, 1927 Today's Date: 09/02/2016   History of Present Illness  Pt is an 83 y.o. female admitted to ED on 08/09/16 with c/o confusion; found to have sepsis likely from cellulitis. Pertinent PMH includes DM, CVA, HTN, a-fib, seizure, CHF, morbid obesity.   Clinical Impression  Patient demonstrates deficits in functional mobility as indicated below. Will need continued skilled PT to address deficits and maximize function. Will see as indicated and progress as tolerated.  Patient very motivated, will trial course of PT to improve activity tolerance and strength, recommend ST SNF for rehabilitation to decrease burden of care and improve ability to tolerate bed mobility, and OOB to chair activities.    Follow Up Recommendations SNF;Supervision/Assistance - 24 hour    Equipment Recommendations  None recommended by PT    Recommendations for Other Services       Precautions / Restrictions Precautions Precautions: Fall Restrictions Weight Bearing Restrictions: No      Mobility  Bed Mobility Overal bed mobility: Needs Assistance Bed Mobility: Rolling;Supine to Sit;Sit to Supine Rolling: Mod assist;+2 for physical assistance   Supine to sit: Max assist;+2 for physical assistance Sit to supine: Max assist;+2 for physical assistance   General bed mobility comments: Patient able to utilize RUE to reach for bed rail and bend right knee to initiate roll, moderate assist of 2 to complete roll, Max asisst of 2 to power to upright at EOB and return to supine.   Transfers                    Ambulation/Gait                Stairs            Wheelchair Mobility    Modified Rankin (Stroke Patients Only)       Balance Overall balance assessment: Needs assistance Sitting-balance support: Bilateral upper extremity supported (trunk supported) Sitting balance-Leahy Scale: Poor Sitting  balance - Comments: able to tolerate EOB with physical support, patient shows some initiation of trunk control with UE support to pull into trunk flexion                                     Pertinent Vitals/Pain Pain Assessment: No/denies pain    Home Living Family/patient expects to be discharged to:: Private residence Living Arrangements: Children Available Help at Discharge: Family;Available 24 hours/day;Personal care attendant Type of Home: House Home Access: Ramped entrance     Home Layout: One level Home Equipment: Wheelchair - Teacher, adult education;Bedside commode;Other (comment) (Hoyer lift) Additional Comments: Pt reports aide who comes daily ("as long as I need him") and daughter perform all aspects of self care; pt able to feed herself.     Prior Function Level of Independence: Needs assistance   Gait / Transfers Assistance Needed: Pt reports bedbound and non-ambulatory for ~4 years. Hoyer lift for [employer] Needed: TotalA from home aide or daughter for bathing, dressing, and toileting        Hand Dominance   Dominant Hand: Right    Extremity/Trunk Assessment   Upper Extremity Assessment Upper Extremity Assessment: Generalized weakness    Lower Extremity Assessment RLE Deficits / Details: Gross RLE strength 2/5; redness and edema noted.  LLE Deficits / Details: Gross LLE strength 1+/5; redness and edema noted.  Communication   Communication: No difficulties  Cognition Arousal/Alertness: Awake/alert Behavior During Therapy: WFL for tasks assessed/performed Overall Cognitive Status: Within Functional Limits for tasks assessed                                        General Comments      Exercises     Assessment/Plan    PT Assessment Patient needs continued PT services  PT Problem List Decreased strength;Decreased range of motion;Decreased activity tolerance;Decreased  balance;Decreased mobility       PT Treatment Interventions Functional mobility training;Therapeutic activities;Therapeutic exercise;Balance training;Patient/family education    PT Goals (Current goals can be found in the Care Plan section)  Acute Rehab PT Goals Patient Stated Goal: Return home  PT Goal Formulation: All assessment and education complete, DC therapy Time For Goal Achievement: 09/10/16 Potential to Achieve Goals: Good    Frequency Min 2X/week   Barriers to discharge        Co-evaluation               AM-PAC PT "6 Clicks" Daily Activity  Outcome Measure Difficulty turning over in bed (including adjusting bedclothes, sheets and blankets)?: Total Difficulty moving from lying on back to sitting on the side of the bed? : Total Difficulty sitting down on and standing up from a chair with arms (e.g., wheelchair, bedside commode, etc,.)?: Total Help needed moving to and from a bed to chair (including a wheelchair)?: Total Help needed walking in hospital room?: Total Help needed climbing 3-5 steps with a railing? : Total 6 Click Score: 6    End of Session Equipment Utilized During Treatment: Oxygen Activity Tolerance: Patient limited by fatigue Patient left: in bed;with call bell/phone within reach Nurse Communication: Mobility status PT Visit Diagnosis: Muscle weakness (generalized) (M62.81)    Time: 6659-9357 PT Time Calculation (min) (ACUTE ONLY): 22 min   Charges:   PT Evaluation $PT Eval Moderate Complexity: 1 Procedure     PT G Codes:        Alben Deeds, PT DPT  [phone]   Duncan Dull 09/02/2016, 2:54 PM

## 2016-09-03 DIAGNOSIS — I4891 Unspecified atrial fibrillation: Secondary | ICD-10-CM

## 2016-09-03 LAB — MAGNESIUM: Magnesium: 1.8 mg/dL (ref 1.7–2.4)

## 2016-09-03 LAB — GLUCOSE, CAPILLARY
GLUCOSE-CAPILLARY: 165 mg/dL — AB (ref 65–99)
Glucose-Capillary: 93 mg/dL (ref 65–99)
Glucose-Capillary: 215 mg/dL — ABNORMAL HIGH (ref 65–99)
Glucose-Capillary: 179 mg/dL — ABNORMAL HIGH (ref 65–99)

## 2016-09-03 LAB — BASIC METABOLIC PANEL
Anion gap: 6 (ref 5–15)
BUN: 19 mg/dL (ref 6–20)
CALCIUM: 8.9 mg/dL (ref 8.9–10.3)
CO2: 35 mmol/L — ABNORMAL HIGH (ref 22–32)
Chloride: 96 mmol/L — ABNORMAL LOW (ref 101–111)
Creatinine, Ser: 0.98 mg/dL (ref 0.44–1.00)
GFR calc Af Amer: 58 mL/min — ABNORMAL LOW (ref >60)
GFR, EST NON AFRICAN AMERICAN: 50 mL/min — AB (ref >60)
GLUCOSE: 129 mg/dL — AB (ref 65–99)
Potassium: 4.1 mmol/L (ref 3.5–5.1)
SODIUM: 137 mmol/L (ref 135–145)

## 2016-09-03 MED ORDER — MAGNESIUM SULFATE IN D5W 1-5 GM/100ML-% IV SOLN
1.0000 g | Freq: Once | INTRAVENOUS | Status: AC
  Administered 2016-09-03: 1 g via INTRAVENOUS
  Filled 2016-09-03: qty 100

## 2016-09-03 MED ORDER — METOLAZONE 2.5 MG PO TABS
2.5000 mg | ORAL_TABLET | Freq: Once | ORAL | Status: AC
  Administered 2016-09-03: 2.5 mg via ORAL
  Filled 2016-09-03: qty 1

## 2016-09-03 NOTE — Progress Notes (Signed)
Patient refused CPAP placement this night. RT will continue to monitor as needed.

## 2016-09-03 NOTE — Progress Notes (Signed)
PROGRESS NOTE                                                                                                                                                                                                             Patient Demographics:    Elizabeth Pugh, is a 83 y.o. female, DOB - [birth year], EGB:[id number]  Admit date - 08/31/2016   Admitting Physician Ivor Costa, MD  Outpatient Primary MD for the patient is Philis Fendt, MD  LOS - 2  Chief Complaint  Patient presents with  . Altered Mental Status       Brief Narrative   Elizabeth Pugh  is a 83 y.o. female, History of morbid obesity, history of stroke now bedbound, chronic systolic and diastolic CHF EF 37% on 4 L nasal cannula oxygen, chronic atrial fibrillation on Eliquis, GERD, DM type 2 insulin-dependent, she was admitted for left leg cellulitis reoccurrence along with acute on chronic combined systolic and diastolic CHF.       Subjective :    Patient in bed, appears comfortable, denies any headache, no fever, no chest pain or pressure, no shortness of breath , no abdominal pain. No focal weakness.         Assessment  & Plan :     1. Fever upon admission unclear etiology, there has been persistent suspicion of recurrent cellulitis, however she has no leukocytosis, legs are not warm, she is completely afebrile at this time and nontoxic.   I think most of her problems are form massive edema which predisposes her for mild intermittent low grade cellulitis which responds readily to ABX. At this time we have initiated aggressive IV lasix + Zaroxolyn , downgrade ABX to PO Doxy. Already improving - monitor I&Os with Foley.  2. Acute on chronic combined systolic diastolic CHF EF 10-62%. Continue beta blocker, continue with diuresis as above along with salt and fluid restriction.  3. GERD. On PPI  4. Morbid obesity with obstructive sleep apnea. Daily at bedtime  CPAP not complaint, counseled and follow with PCP for weight loss.  5. Chronic atrial fibrillation, history of PE. [place] vasc 2 score of at least 4. Continue Lopressor along with Eliquis.  6. History of stroke. Supportive skin no acute issues she is now bedbound.   7. DM type II. On Lantus + ISS, stable CBGs  Lab Results  Component Value Date   HGBA1C 8.7 (H) 08/23/2016   CBG (last 3)   Recent Labs  09/02/16 1622 09/02/16 2107 09/03/16 0823  GLUCAP 138* 184* 93      Diet : Diet heart healthy/carb modified Room service appropriate? Yes; Fluid consistency: Thin; Fluid restriction: 1500 mL Fluid    Family Communication  :  daughters  Code Status :  Full  Disposition Plan  :  HHPT  Consults  :  None  Procedures  :  None  DVT Prophylaxis  :  Eliquis  Lab Results  Component Value Date   PLT 154 09/01/2016    Inpatient Medications  Scheduled Meds: . apixaban  5 mg Oral BID  . calcium-vitamin D  1 tablet Oral BID  . docusate sodium  200 mg Oral BID  . doxycycline  100 mg Oral Q12H  . ferrous sulfate  325 mg Oral Q supper  . furosemide  40 mg Intravenous TID  . insulin aspart  0-9 Units Subcutaneous TID WC  . insulin glargine  30 Units Subcutaneous QHS  . levETIRAcetam  500 mg Oral BID  . metolazone  2.5 mg Oral Once  . metoprolol  2.5 mg Intravenous Q8H  . pantoprazole  40 mg Oral q morning - 10a  . potassium chloride SA  20 mEq Oral Daily  . potassium chloride  40 mEq Oral Daily  . pregabalin  75 mg Oral QHS  . rOPINIRole  4 mg Oral BID  . Vitamin D (Ergocalciferol)  50,000 Units Oral Q Wed   Continuous Infusions:  PRN Meds:.acetaminophen **OR** [DISCONTINUED] acetaminophen, albuterol, [DISCONTINUED] ondansetron **OR** ondansetron (ZOFRAN) IV, polyethylene glycol  Antibiotics  :    Anti-infectives    Start     Dose/Rate Route Frequency Ordered Stop   09/03/16 0800  levofloxacin (LEVAQUIN) IVPB 750 mg  Status:  Discontinued     750 mg 100 mL/hr over  90 Minutes Intravenous Every 48 hours 09/01/16 0555 09/01/16 1129   09/03/16 0600  vancomycin (VANCOCIN) IVPB 1000 mg/200 mL premix  Status:  Discontinued     1,000 mg 200 mL/hr over 60 Minutes Intravenous Every 48 hours 09/01/16 0555 09/02/16 0841   09/02/16 1400  fluconazole (DIFLUCAN) IVPB 200 mg  Status:  Discontinued     200 mg 100 mL/hr over 60 Minutes Intravenous Every 24 hours 09/01/16 1601 09/02/16 0850   09/02/16 1000  doxycycline (VIBRA-TABS) tablet 100 mg     100 mg Oral Every 12 hours 09/02/16 0843     09/01/16 2330  ceFEPIme (MAXIPIME) 2 g in dextrose 5 % 50 mL IVPB  Status:  Discontinued     2 g 100 mL/hr over 30 Minutes Intravenous Every 8 hours 09/01/16 1130 09/01/16 1131   09/01/16 2330  ceFEPIme (MAXIPIME) 2 g in dextrose 5 % 50 mL IVPB  Status:  Discontinued     2 g 100 mL/hr over 30 Minutes Intravenous Every 24 hours 09/01/16 1131 09/02/16 0841   09/01/16 1345  fluconazole (DIFLUCAN) IVPB 400 mg     400 mg 100 mL/hr over 120 Minutes Intravenous Every 24 hours 09/01/16 1338 09/01/16 1618   09/01/16 0800  aztreonam (AZACTAM) 1 g in dextrose 5 % 50 mL IVPB  Status:  Discontinued     1 g 100 mL/hr over 30 Minutes Intravenous Every 8 hours 09/01/16 0555 09/01/16 1129   08/31/16 2359  vancomycin (VANCOCIN) 2,000 mg in sodium chloride 0.9 % 500 mL IVPB  2,000 mg 250 mL/hr over 120 Minutes Intravenous  Once 08/31/16 2328 09/01/16 0310   08/31/16 2330  levofloxacin (LEVAQUIN) IVPB 750 mg     750 mg 100 mL/hr over 90 Minutes Intravenous  Once 08/31/16 2324 09/01/16 0148   08/31/16 2330  aztreonam (AZACTAM) 2 g in dextrose 5 % 50 mL IVPB     2 g 100 mL/hr over 30 Minutes Intravenous  Once 08/31/16 2324 09/01/16 0149   08/31/16 2330  vancomycin (VANCOCIN) IVPB 1000 mg/200 mL premix  Status:  Discontinued     1,000 mg 200 mL/hr over 60 Minutes Intravenous  Once 08/31/16 2324 08/31/16 2328         Objective:   Vitals:   09/02/16 0757 09/02/16 1201 09/02/16 1930  09/03/16 0526  BP:  129/83 134/82 138/78  Pulse:  76 92 80  Resp:  20 18 18   Temp:  98.1 F (36.7 C) 97.7 F (36.5 C) 97.6 F (36.4 C)  TempSrc:  Oral Oral Oral  SpO2:  100% 100% 100%  Weight: 129.3 kg (285 lb)   127.5 kg (281 lb)  Height:        Wt Readings from Last 3 Encounters:  09/03/16 127.5 kg (281 lb)  08/28/16 121.6 kg (268 lb)  08/13/16 132.5 kg (292 lb 3.2 oz)     Intake/Output Summary (Last 24 hours) at 09/03/16 0858 Last data filed at 09/03/16 0600  Gross per 24 hour  Intake             1280 ml  Output             2501 ml  Net            -1221 ml     Physical Exam  Awake Alert, No new F.N deficits, Normal affect [place].AT,PERRAL Supple Neck,No JVD, No cervical lymphadenopathy appriciated.  Symmetrical Chest wall movement, Good air movement bilaterally, CTAB RRR,No Gallops,Rubs or new Murmurs, No Parasternal Heave +ve B.Sounds, Abd Soft, No tenderness, No organomegaly appriciated, No rebound - guarding or rigidity. No Cyanosis, Clubbing . 2+ leg-abdominal wall edema, No new Rash or bruise     Data Review:    CBC  Recent Labs Lab 08/31/16 2334 09/01/16 0545  WBC 5.5 5.8  HGB 11.7* 10.9*  HCT 39.0 35.0*  PLT 184 154  MCV 96.3 95.9  MCH 28.9 29.9  MCHC 30.0 31.1  RDW 14.4 14.2  LYMPHSABS 1.1  --   MONOABS 0.4  --   EOSABS 0.0  --   BASOSABS 0.0  --     Chemistries   Recent Labs Lab 08/28/16 0811 08/31/16 2334 09/01/16 0545 09/02/16 0731 09/03/16 0337  NA 137 138 140 141 137  K 3.6 4.5 4.7 3.8 4.1  CL 90* 94* 100* 103 96*  CO2 38* 33* 27 31 35*  GLUCOSE 192* 206* 177* 94 129*  BUN 19 28* 27* 21* 19  CREATININE 1.22* 1.32* 1.25* 1.06* 0.98  CALCIUM 9.1 9.3 8.6* 8.6* 8.9  MG  --   --   --   --  1.8  AST  --  22  --  16  --   ALT  --  12*  --  10*  --   ALKPHOS  --  52  --  38  --   BILITOT  --  1.0  --  0.9  --     ------------------------------------------------------------------------------------------------------------------ No results for input(s): CHOL, HDL, LDLCALC, TRIG, CHOLHDL, LDLDIRECT in the last 72 hours.  Lab Results  Component Value Date   HGBA1C 8.7 (H) 08/23/2016   ------------------------------------------------------------------------------------------------------------------ No results for input(s): TSH, T4TOTAL, T3FREE, THYROIDAB in the last 72 hours.  Invalid input(s): FREET3 ------------------------------------------------------------------------------------------------------------------ No results for input(s): VITAMINB12, FOLATE, FERRITIN, TIBC, IRON, RETICCTPCT in the last 72 hours.  Coagulation profile  Recent Labs Lab 09/01/16 0318  INR 1.45    No results for input(s): DDIMER in the last 72 hours.  Cardiac Enzymes No results for input(s): CKMB, TROPONINI, MYOGLOBIN in the last 168 hours.  Invalid input(s): CK ------------------------------------------------------------------------------------------------------------------    Component Value Date/Time   BNP 187.2 (H) 09/01/2016 0602    Micro Results Recent Results (from the past 240 hour(s))  Culture, blood (Routine x 2)     Status: None (Preliminary result)   Collection Time: 08/31/16 11:30 PM  Result Value Ref Range Status   Specimen Description BLOOD LEFT HAND  Final   Special Requests IN PEDIATRIC BOTTLE Blood Culture adequate volume  Final   Culture NO GROWTH 1 DAY  Final   Report Status PENDING  Incomplete  Culture, blood (Routine x 2)     Status: None (Preliminary result)   Collection Time: 08/31/16 11:35 PM  Result Value Ref Range Status   Specimen Description BLOOD RIGHT HAND  Final   Special Requests IN PEDIATRIC BOTTLE Blood Culture adequate volume  Final   Culture NO GROWTH 1 DAY  Final   Report Status PENDING  Incomplete  Urine culture     Status: None   Collection Time: 09/01/16   1:43 AM  Result Value Ref Range Status   Specimen Description URINE, RANDOM  Final   Special Requests NONE  Final   Culture NO GROWTH  Final   Report Status 09/02/2016 FINAL  Final    Radiology Reports Dg Chest Port 1 View  Result Date: 08/31/2016 CLINICAL DATA:  Altered mental status.  Fever and dyspnea. EXAM: PORTABLE CHEST 1 VIEW COMPARISON:  08/11/2016 FINDINGS: Unchanged moderate cardiomegaly. Mild vascular prominence, stable and likely chronic. Mild pleural thickening in the lateral and apical regions bilaterally, chronic. No confluent airspace consolidation. IMPRESSION: Stable cardiomegaly. Stable mild vascular prominence. No consolidation or large effusion. Electronically Signed   By: Andreas Newport M.D.   On: 08/31/2016 23:58   Portable Chest 1 View  Result Date: 08/11/2016 CLINICAL DATA:  Acute respiratory failure with hypoxia EXAM: PORTABLE CHEST 1 VIEW COMPARISON:  08/10/2016 FINDINGS: Cardiomegaly with vascular congestion. Low lung volumes. Small right pleural effusion again noted. No confluent opacities or edema. IMPRESSION: Cardiomegaly with vascular congestion.  Stable small right effusion. Electronically Signed   By: Rolm Baptise M.D.   On: 08/11/2016 07:18   Dg Chest Portable 1 View  Result Date: 08/10/2016 CLINICAL DATA:  Shortness of breath and cellulitis EXAM: PORTABLE CHEST 1 VIEW COMPARISON:  08/10/2016 FINDINGS: Cardiac shadow remains enlarged. The lungs are well aerated bilaterally. Some blunting of the right costophrenic angle consistent with small effusion on the right is noted better visualize from the prior exam. No new focal infiltrate is seen. IMPRESSION: Small right effusion.  No other new focal abnormality is seen. Electronically Signed   By: Inez Catalina M.D.   On: 08/10/2016 19:22   Dg Chest Port 1 View  Result Date: 08/10/2016 CLINICAL DATA:  Tachycardia and lethargy.  Fever. EXAM: PORTABLE CHEST 1 VIEW COMPARISON:  July 13, 2016 FINDINGS: There is no  edema or consolidation. Heart is enlarged with pulmonary vascularity within normal limits. No adenopathy. There is atherosclerotic calcification in  the aorta. There is evidence of old trauma involving the left clavicle. IMPRESSION: Cardiomegaly. No edema or consolidation. There is aortic atherosclerosis. Electronically Signed   By: Lowella Grip III M.D.   On: 08/10/2016 12:33    Time Spent in minutes  30   Lala Lund M.D on 09/03/2016 at 8:58 AM  Between 7am to 7pm - Pager - [phone] ( page via [facility], text pages only, please mention full 10 digit call back number). After 7pm go to [email or web address] - password [email or web address]

## 2016-09-03 NOTE — Care Management Note (Signed)
Case Management Note  Patient Details  Name: Elizabeth Pugh MRN: [id number] Date of Birth: [birth year]  Subjective/Objective:   Admitted with Sepsis                Action/Plan: Patient lives at home with her daughter; Philis Fendt, MD; has private insurance with Medicare / [employer]; Per Leodis Sias, patient is in her copay days and is unable to pay out of pocket to go to SNF and has decided to return home with [facility] services. Patient is active with [facility]) as prior to admission. Patient told CM that she does not need any equipment/ DME - she has a hospital bed. [place], cane and oxygen at home. She will need ambulance transportation home at discharge; CM will continue to follow for DCP  Expected Discharge Date:      Possibly 09/06/2016            Expected Discharge Plan:  [place]  In-House Referral:  Clinical Social Work  Discharge planning Services  CM Consult  HH Arranged:  RN, PT, OT, Nurse's Aide, Social Work [employer] Agency:  [employer] (now [facility])  Status of Service:  In process, will continue to follow  Sherrilyn Rist [phone] 09/03/2016, 1:47 PM

## 2016-09-03 NOTE — Clinical Social Work Note (Signed)
CSW met with patient to discuss SNF placement. Per social work note from last admission (08/27/2016), patient was at [facility] from 2/20-3/10 and is still in her copay days. This would cost around $160 per day for patient to go to rehab. Previous Education officer, museum was able to find out that the patient's secondary insurance will not pay the copay. Patient stated that she cannot afford this but she is agreeable to HHPT. RNCM notified.  CSW signing off. Consult again if any other social work needs arise.  Dayton Scrape, [place]

## 2016-09-04 LAB — BASIC METABOLIC PANEL
ANION GAP: 9 (ref 5–15)
BUN: 18 mg/dL (ref 6–20)
CALCIUM: 9.3 mg/dL (ref 8.9–10.3)
CO2: 34 mmol/L — AB (ref 22–32)
CREATININE: 1.08 mg/dL — AB (ref 0.44–1.00)
Chloride: 94 mmol/L — ABNORMAL LOW (ref 101–111)
GFR calc Af Amer: 52 mL/min — ABNORMAL LOW (ref >60)
GFR, EST NON AFRICAN AMERICAN: 44 mL/min — AB (ref >60)
GLUCOSE: 170 mg/dL — AB (ref 65–99)
Potassium: 4.5 mmol/L (ref 3.5–5.1)
Sodium: 137 mmol/L (ref 135–145)

## 2016-09-04 LAB — GLUCOSE, CAPILLARY
GLUCOSE-CAPILLARY: 178 mg/dL — AB (ref 65–99)
GLUCOSE-CAPILLARY: 204 mg/dL — AB (ref 65–99)
Glucose-Capillary: 216 mg/dL — ABNORMAL HIGH (ref 65–99)
Glucose-Capillary: 260 mg/dL — ABNORMAL HIGH (ref 65–99)

## 2016-09-04 LAB — MAGNESIUM: Magnesium: 1.8 mg/dL (ref 1.7–2.4)

## 2016-09-04 MED ORDER — METOLAZONE 5 MG PO TABS
5.0000 mg | ORAL_TABLET | Freq: Once | ORAL | Status: AC
  Administered 2016-09-04: 5 mg via ORAL
  Filled 2016-09-04: qty 1

## 2016-09-04 MED ORDER — FUROSEMIDE 10 MG/ML IJ SOLN
80.0000 mg | Freq: Two times a day (BID) | INTRAMUSCULAR | Status: DC
  Administered 2016-09-04: 80 mg via INTRAVENOUS
  Filled 2016-09-04: qty 8

## 2016-09-04 MED ORDER — APIXABAN 5 MG PO TABS
5.0000 mg | ORAL_TABLET | Freq: Two times a day (BID) | ORAL | Status: DC
  Administered 2016-09-05 – 2016-09-07 (×5): 5 mg via ORAL
  Filled 2016-09-04 (×5): qty 1

## 2016-09-04 MED ORDER — SALINE SPRAY 0.65 % NA SOLN
1.0000 | NASAL | Status: DC
  Administered 2016-09-04 – 2016-09-07 (×14): 1 via NASAL
  Filled 2016-09-04: qty 44

## 2016-09-04 MED ORDER — HYDROCODONE-ACETAMINOPHEN 5-325 MG PO TABS
1.0000 | ORAL_TABLET | Freq: Four times a day (QID) | ORAL | Status: DC | PRN
  Administered 2016-09-04: 1 via ORAL
  Filled 2016-09-04: qty 1

## 2016-09-04 NOTE — Discharge Instructions (Addendum)
Follow with Primary MD Nolene Ebbs, MD in 3-4 days   Get CBC, CMP, 2 view Chest X ray checked  by Primary MD  in 3-4 days ( we routinely change or add medications that can affect your baseline labs and fluid status, therefore we recommend that you get the mentioned basic workup next visit with your PCP, your PCP may decide not to get them or add new tests based on their clinical decision)  Activity: As tolerated with Full fall precautions use walker/cane & assistance as needed  Disposition Home    Diet:   Diet heart healthy/carb modified Fluid restriction: 1500 mL/ day    For Heart failure patients - Check your Weight same time everyday, if you gain over 2 pounds, or you develop in leg swelling, experience more shortness of breath or chest pain, call your Primary MD immediately. Follow Cardiac Low Salt Diet and 1.5 lit/day fluid restriction.  On your next visit with your primary care physician please Get Medicines reviewed and adjusted.  Please request your Prim.MD to go over all Hospital Tests and Procedure/Radiological results at the follow up, please get all Hospital records sent to your Prim MD by signing hospital release before you go home.  If you experience worsening of your admission symptoms, develop shortness of breath, life threatening emergency, suicidal or homicidal thoughts you must seek medical attention immediately by calling 911 or calling your MD immediately  if symptoms less severe.  You Must read complete instructions/literature along with all the possible adverse reactions/side effects for all the Medicines you take and that have been prescribed to you. Take any new Medicines after you have completely understood and accpet all the possible adverse reactions/side effects.   Do not drive, operate heavy machinery, perform activities at heights, swimming or participation in water activities or provide baby sitting services if your were admitted for syncope or siezures until  you have seen by Primary MD or a Neurologist and advised to do so again.  Do not drive when taking Pain medications.    Do not take more than prescribed Pain, Sleep and Anxiety Medications  Special Instructions: If you have smoked or chewed Tobacco  in the last 2 yrs please stop smoking, stop any regular Alcohol  and or any Recreational drug use.  Wear Seat belts while driving.   Please note  You were cared for by a hospitalist during your hospital stay. If you have any questions about your discharge medications or the care you received while you were in the hospital after you are discharged, you can call the unit and asked to speak with the hospitalist on call if the hospitalist that took care of you is not available. Once you are discharged, your primary care physician will handle any further medical issues. Please note that NO REFILLS for any discharge medications will be authorized once you are discharged, as it is imperative that you return to your primary care physician (or establish a relationship with a primary care physician if you do not have one) for your aftercare needs so that they can reassess your need for medications and monitor your lab values.     Information on my medicine - ELIQUIS (apixaban)  This medication education was reviewed with me or my healthcare representative as part of my discharge preparation.  Why was Eliquis prescribed for you? Eliquis was prescribed for you to reduce the risk of a blood clot forming that can cause a stroke if you have a medical condition  called atrial fibrillation (a type of irregular heartbeat).  What do You need to know about Eliquis ? Take your Eliquis TWICE DAILY - one tablet in the morning and one tablet in the evening with or without food. If you have difficulty swallowing the tablet whole please discuss with your pharmacist how to take the medication safely.  Take Eliquis exactly as prescribed by your doctor and DO NOT  stop taking Eliquis without talking to the doctor who prescribed the medication.  Stopping may increase your risk of developing a stroke.  Refill your prescription before you run out.  After discharge, you should have regular check-up appointments with your healthcare provider that is prescribing your Eliquis.  In the future your dose may need to be changed if your kidney function or weight changes by a significant amount or as you get older.  What do you do if you miss a dose? If you miss a dose, take it as soon as you remember on the same day and resume taking twice daily.  Do not take more than one dose of ELIQUIS at the same time to make up a missed dose.  Important Safety Information A possible side effect of Eliquis is bleeding. You should call your healthcare provider right away if you experience any of the following: ? Bleeding from an injury or your nose that does not stop. ? Unusual colored urine (red or dark brown) or unusual colored stools (red or black). ? Unusual bruising for unknown reasons. ? A serious fall or if you hit your head (even if there is no bleeding).  Some medicines may interact with Eliquis and might increase your risk of bleeding or clotting while on Eliquis. To help avoid this, consult your healthcare provider or pharmacist prior to using any new prescription or non-prescription medications, including herbals, vitamins, non-steroidal anti-inflammatory drugs (NSAIDs) and supplements.  This website has more information on Eliquis (apixaban): http://www.eliquis.com/eliquis/home

## 2016-09-04 NOTE — Progress Notes (Signed)
Patient refused CPAP HS tonight. Patient in  no distress at this time. 

## 2016-09-04 NOTE — Progress Notes (Signed)
Page sent to Dr Candiss Norse.  3e23. Elizabeth Pugh. FYI pt experiencing frequent blood tinged sputum, please advise any orders or interventions. pt is on eliquis. thank you

## 2016-09-04 NOTE — Progress Notes (Signed)
PROGRESS NOTE                                                                                                                                                                                                             Patient Demographics:    Elizabeth Pugh, is a 83 y.o. female, DOB - November 26, 1926, HFW:[id number]  Admit date - 08/31/2016   Admitting Physician Ivor Costa, MD  Outpatient Primary MD for the patient is Philis Fendt, MD  LOS - 3  Chief Complaint  Patient presents with  . Altered Mental Status       Brief Narrative   Elizabeth Pugh  is a 83 y.o. female, History of morbid obesity, history of stroke now bedbound, chronic systolic and diastolic CHF EF 02% on 4 L nasal cannula oxygen, chronic atrial fibrillation on Eliquis, GERD, DM type 2 insulin-dependent, she was admitted for left leg cellulitis reoccurrence along with acute on chronic combined systolic and diastolic CHF.       Subjective :   Patient in bed, appears comfortable, denies any headache, no fever, no chest pain or pressure, no shortness of breath , no abdominal pain. No focal weakness.        Assessment  & Plan :     1. Fever upon admission unclear etiology, there has been persistent suspicion of recurrent cellulitis, however she has no leukocytosis, legs are not warm, she is completely afebrile at this time and nontoxic. I think most of her problems are form massive edema which predisposes her for mild intermittent low grade cellulitis which responds readily to ABX.   Continue diuresis with high-dose IV Lasix and oral Zaroxolyn, continue oral doxycycline, infection and cellulitis have clinically resolved. Continue restricting salt and fluid intake, Monitor intake and output and BMP.    2. Acute on chronic combined systolic diastolic CHF EF 77-41%. On beta blocker along with diuretics as above, continue on salt and fluid restriction. Currently no  shortness of breath.   3. GERD. Continue on PPI.  4. Morbid obesity with obstructive sleep apnea. Daily at bedtime CPAP not complaint, counseled and follow with PCP for weight loss.  5. Chronic atrial fibrillation, history of PE. [place] vasc 2 score of at least 4. Continue Lopressor along with Eliquis.  6. History of stroke. Supportive skin no acute issues she is now  bedbound.   7. DM type II. On Lantus and sliding scale monitor CBGs  Lab Results  Component Value Date   HGBA1C 8.7 (H) 08/23/2016   CBG (last 3)   Recent Labs  09/03/16 1727 09/03/16 2115 09/04/16 0757  GLUCAP 215* 179* 216*      Diet : Diet heart healthy/carb modified Room service appropriate? Yes; Fluid consistency: Thin; Fluid restriction: 1500 mL Fluid    Family Communication  :  daughters  Code Status :  Full  Disposition Plan  :  HHPT  Consults  :  None  Procedures  :  None  DVT Prophylaxis  :  Eliquis  Lab Results  Component Value Date   PLT 154 09/01/2016    Inpatient Medications  Scheduled Meds: . apixaban  5 mg Oral BID  . calcium-vitamin D  1 tablet Oral BID  . docusate sodium  200 mg Oral BID  . doxycycline  100 mg Oral Q12H  . ferrous sulfate  325 mg Oral Q supper  . furosemide  80 mg Intravenous BID  . insulin aspart  0-9 Units Subcutaneous TID WC  . insulin glargine  30 Units Subcutaneous QHS  . levETIRAcetam  500 mg Oral BID  . metoprolol  2.5 mg Intravenous Q8H  . pantoprazole  40 mg Oral q morning - 10a  . potassium chloride SA  20 mEq Oral Daily  . pregabalin  75 mg Oral QHS  . rOPINIRole  4 mg Oral BID  . Vitamin D (Ergocalciferol)  50,000 Units Oral Q Wed   Continuous Infusions:  PRN Meds:.acetaminophen **OR** [DISCONTINUED] acetaminophen, albuterol, [DISCONTINUED] ondansetron **OR** ondansetron (ZOFRAN) IV, polyethylene glycol  Antibiotics  :    Anti-infectives    Start     Dose/Rate Route Frequency Ordered Stop   09/03/16 0800  levofloxacin (LEVAQUIN)  IVPB 750 mg  Status:  Discontinued     750 mg 100 mL/hr over 90 Minutes Intravenous Every 48 hours 09/01/16 0555 09/01/16 1129   09/03/16 0600  vancomycin (VANCOCIN) IVPB 1000 mg/200 mL premix  Status:  Discontinued     1,000 mg 200 mL/hr over 60 Minutes Intravenous Every 48 hours 09/01/16 0555 09/02/16 0841   09/02/16 1400  fluconazole (DIFLUCAN) IVPB 200 mg  Status:  Discontinued     200 mg 100 mL/hr over 60 Minutes Intravenous Every 24 hours 09/01/16 1601 09/02/16 0850   09/02/16 1000  doxycycline (VIBRA-TABS) tablet 100 mg     100 mg Oral Every 12 hours 09/02/16 0843     09/01/16 2330  ceFEPIme (MAXIPIME) 2 g in dextrose 5 % 50 mL IVPB  Status:  Discontinued     2 g 100 mL/hr over 30 Minutes Intravenous Every 8 hours 09/01/16 1130 09/01/16 1131   09/01/16 2330  ceFEPIme (MAXIPIME) 2 g in dextrose 5 % 50 mL IVPB  Status:  Discontinued     2 g 100 mL/hr over 30 Minutes Intravenous Every 24 hours 09/01/16 1131 09/02/16 0841   09/01/16 1345  fluconazole (DIFLUCAN) IVPB 400 mg     400 mg 100 mL/hr over 120 Minutes Intravenous Every 24 hours 09/01/16 1338 09/01/16 1618   09/01/16 0800  aztreonam (AZACTAM) 1 g in dextrose 5 % 50 mL IVPB  Status:  Discontinued     1 g 100 mL/hr over 30 Minutes Intravenous Every 8 hours 09/01/16 0555 09/01/16 1129   08/31/16 2359  vancomycin (VANCOCIN) 2,000 mg in sodium chloride 0.9 % 500 mL IVPB     2,000  mg 250 mL/hr over 120 Minutes Intravenous  Once 08/31/16 2328 09/01/16 0310   08/31/16 2330  levofloxacin (LEVAQUIN) IVPB 750 mg     750 mg 100 mL/hr over 90 Minutes Intravenous  Once 08/31/16 2324 09/01/16 0148   08/31/16 2330  aztreonam (AZACTAM) 2 g in dextrose 5 % 50 mL IVPB     2 g 100 mL/hr over 30 Minutes Intravenous  Once 08/31/16 2324 09/01/16 0149   08/31/16 2330  vancomycin (VANCOCIN) IVPB 1000 mg/200 mL premix  Status:  Discontinued     1,000 mg 200 mL/hr over 60 Minutes Intravenous  Once 08/31/16 2324 08/31/16 2328         Objective:    Vitals:   09/03/16 1248 09/03/16 1641 09/03/16 2003 09/04/16 0500  BP: 115/71 103/78 130/90 130/80  Pulse: 95 80 99 100  Resp: 18  17 16   Temp: 97.4 F (36.3 C)  98.5 F (36.9 C) 98.2 F (36.8 C)  TempSrc: Oral  Oral Oral  SpO2: 100% 100% 100% 100%  Weight:    126 kg (277 lb 12.8 oz)  Height:        Wt Readings from Last 3 Encounters:  09/04/16 126 kg (277 lb 12.8 oz)  08/28/16 121.6 kg (268 lb)  08/13/16 132.5 kg (292 lb 3.2 oz)     Intake/Output Summary (Last 24 hours) at 09/04/16 1113 Last data filed at 09/04/16 0733  Gross per 24 hour  Intake              240 ml  Output             3050 ml  Net            -2810 ml     Physical Exam  Awake Alert, No new F.N deficits, Normal affect [place].AT,PERRAL Supple Neck,No JVD, No cervical lymphadenopathy appriciated.  Symmetrical Chest wall movement, Good air movement bilaterally, CTAB RRR,No Gallops,Rubs or new Murmurs, No Parasternal Heave +ve B.Sounds, Abd Soft, No tenderness, No organomegaly appriciated, No rebound - guarding or rigidity. No Cyanosis, Clubbing . 1+ leg- Abd wall edema, No new Rash or bruise      Data Review:    CBC  Recent Labs Lab 08/31/16 2334 09/01/16 0545  WBC 5.5 5.8  HGB 11.7* 10.9*  HCT 39.0 35.0*  PLT 184 154  MCV 96.3 95.9  MCH 28.9 29.9  MCHC 30.0 31.1  RDW 14.4 14.2  LYMPHSABS 1.1  --   MONOABS 0.4  --   EOSABS 0.0  --   BASOSABS 0.0  --     Chemistries   Recent Labs Lab 08/31/16 2334 09/01/16 0545 09/02/16 0731 09/03/16 0337 09/04/16 0338  NA 138 140 141 137 137  K 4.5 4.7 3.8 4.1 4.5  CL 94* 100* 103 96* 94*  CO2 33* 27 31 35* 34*  GLUCOSE 206* 177* 94 129* 170*  BUN 28* 27* 21* 19 18  CREATININE 1.32* 1.25* 1.06* 0.98 1.08*  CALCIUM 9.3 8.6* 8.6* 8.9 9.3  MG  --   --   --  1.8 1.8  AST 22  --  16  --   --   ALT 12*  --  10*  --   --   ALKPHOS 52  --  38  --   --   BILITOT 1.0  --  0.9  --   --     ------------------------------------------------------------------------------------------------------------------ No results for input(s): CHOL, HDL, LDLCALC, TRIG, CHOLHDL, LDLDIRECT in the last 72 hours.  Lab Results  Component Value Date   HGBA1C 8.7 (H) 08/23/2016   ------------------------------------------------------------------------------------------------------------------ No results for input(s): TSH, T4TOTAL, T3FREE, THYROIDAB in the last 72 hours.  Invalid input(s): FREET3 ------------------------------------------------------------------------------------------------------------------ No results for input(s): VITAMINB12, FOLATE, FERRITIN, TIBC, IRON, RETICCTPCT in the last 72 hours.  Coagulation profile  Recent Labs Lab 09/01/16 0318  INR 1.45    No results for input(s): DDIMER in the last 72 hours.  Cardiac Enzymes No results for input(s): CKMB, TROPONINI, MYOGLOBIN in the last 168 hours.  Invalid input(s): CK ------------------------------------------------------------------------------------------------------------------    Component Value Date/Time   BNP 187.2 (H) 09/01/2016 0602    Micro Results Recent Results (from the past 240 hour(s))  Culture, blood (Routine x 2)     Status: None (Preliminary result)   Collection Time: 08/31/16 11:30 PM  Result Value Ref Range Status   Specimen Description BLOOD LEFT HAND  Final   Special Requests IN PEDIATRIC BOTTLE Blood Culture adequate volume  Final   Culture NO GROWTH 2 DAYS  Final   Report Status PENDING  Incomplete  Culture, blood (Routine x 2)     Status: None (Preliminary result)   Collection Time: 08/31/16 11:35 PM  Result Value Ref Range Status   Specimen Description BLOOD RIGHT HAND  Final   Special Requests IN PEDIATRIC BOTTLE Blood Culture adequate volume  Final   Culture NO GROWTH 2 DAYS  Final   Report Status PENDING  Incomplete  Urine culture     Status: None   Collection Time: 09/01/16   1:43 AM  Result Value Ref Range Status   Specimen Description URINE, RANDOM  Final   Special Requests NONE  Final   Culture NO GROWTH  Final   Report Status 09/02/2016 FINAL  Final    Radiology Reports Dg Chest Port 1 View  Result Date: 08/31/2016 CLINICAL DATA:  Altered mental status.  Fever and dyspnea. EXAM: PORTABLE CHEST 1 VIEW COMPARISON:  08/11/2016 FINDINGS: Unchanged moderate cardiomegaly. Mild vascular prominence, stable and likely chronic. Mild pleural thickening in the lateral and apical regions bilaterally, chronic. No confluent airspace consolidation. IMPRESSION: Stable cardiomegaly. Stable mild vascular prominence. No consolidation or large effusion. Electronically Signed   By: Andreas Newport M.D.   On: 08/31/2016 23:58   Portable Chest 1 View  Result Date: 08/11/2016 CLINICAL DATA:  Acute respiratory failure with hypoxia EXAM: PORTABLE CHEST 1 VIEW COMPARISON:  08/10/2016 FINDINGS: Cardiomegaly with vascular congestion. Low lung volumes. Small right pleural effusion again noted. No confluent opacities or edema. IMPRESSION: Cardiomegaly with vascular congestion.  Stable small right effusion. Electronically Signed   By: Rolm Baptise M.D.   On: 08/11/2016 07:18   Dg Chest Portable 1 View  Result Date: 08/10/2016 CLINICAL DATA:  Shortness of breath and cellulitis EXAM: PORTABLE CHEST 1 VIEW COMPARISON:  08/10/2016 FINDINGS: Cardiac shadow remains enlarged. The lungs are well aerated bilaterally. Some blunting of the right costophrenic angle consistent with small effusion on the right is noted better visualize from the prior exam. No new focal infiltrate is seen. IMPRESSION: Small right effusion.  No other new focal abnormality is seen. Electronically Signed   By: Inez Catalina M.D.   On: 08/10/2016 19:22   Dg Chest Port 1 View  Result Date: 08/10/2016 CLINICAL DATA:  Tachycardia and lethargy.  Fever. EXAM: PORTABLE CHEST 1 VIEW COMPARISON:  July 13, 2016 FINDINGS: There is no  edema or consolidation. Heart is enlarged with pulmonary vascularity within normal limits. No adenopathy. There is atherosclerotic calcification in  the aorta. There is evidence of old trauma involving the left clavicle. IMPRESSION: Cardiomegaly. No edema or consolidation. There is aortic atherosclerosis. Electronically Signed   By: Lowella Grip III M.D.   On: 08/10/2016 12:33    Time Spent in minutes  30   Lala Lund M.D on 09/04/2016 at 11:13 AM  Between 7am to 7pm - Pager - [phone] ( page via [facility], text pages only, please mention full 10 digit call back number). After 7pm go to [email or web address] - password [facility]

## 2016-09-04 NOTE — Care Management Important Message (Signed)
Important Message  Patient Details  Name: Elizabeth Pugh MRN: [id number] Date of Birth: 21-Feb-1927   Medicare Important Message Given:  Yes    Jolane Bankhead 09/04/2016, 11:24 AM

## 2016-09-04 NOTE — Progress Notes (Signed)
Physical Therapy Treatment Patient Details Name: Elizabeth Pugh MRN: [id number] DOB: 1926/09/21 Today's Date: 09/04/2016    History of Present Illness Pt is an 83 y.o. female admitted to ED on 08/09/16 with c/o confusion; found to have sepsis likely from cellulitis. Pertinent PMH includes DM, CVA, HTN, a-fib, seizure, CHF, morbid obesity.     PT Comments    Patient able to progress with side to sit during session with cues and extra time.  Remains appropriate for SNF level therapy; however, as pt unable to afford co-pay will need HHPT and [place] aide if able to progress mobility.  She is motivated, limited with sitting this session due to surface of bed not good for sitting.  Will follow acutely.    Follow Up Recommendations  Home health PT (due to unable to pay for SNF)     Equipment Recommendations  None recommended by PT    Recommendations for Other Services       Precautions / Restrictions Precautions Precautions: Fall    Mobility  Bed Mobility Overal bed mobility: Needs Assistance Bed Mobility: Rolling;Supine to Sit;Sit to Supine Rolling: Mod assist;+2 for physical assistance   Supine to sit: Max assist;+2 for physical assistance Sit to supine: Max assist;+2 for physical assistance   General bed mobility comments: cues to reach for railon L with R UE, unable to assist wtih moving L LE, but moved R LE and needed assist of 2 to scoot hips; assist for bringing trunk upright initially max A of 2; after seated let pt lean on L elbow and able to push up into sitting with mod A; to supine assist for trunk and legs  Transfers                    Ambulation/Gait                 Stairs            Wheelchair Mobility    Modified Rankin (Stroke Patients Only)       Balance Overall balance assessment: Needs assistance Sitting-balance support: Bilateral upper extremity supported Sitting balance-Leahy Scale: Zero Sitting balance - Comments: mod to max A for  sitting EOB; sat EOB about 4-5 minutes but limited due to pain with bed rail digging into L LE in sitting; leaning back and to L  Postural control: Left lateral lean;Posterior lean                                  Cognition Arousal/Alertness: Awake/alert Behavior During Therapy: WFL for tasks assessed/performed Overall Cognitive Status: Within Functional Limits for tasks assessed                                        Exercises      General Comments General comments (skin integrity, edema, etc.): assist for L LE PROM      Pertinent Vitals/Pain Pain Assessment: Faces Faces Pain Scale: Hurts little more Pain Location: L LE Pain Descriptors / Indicators: Aching;Sore Pain Intervention(s): Monitored during session;Repositioned    Home Living                      Prior Function            PT Goals (current goals can now be found in the care  plan section) Progress towards PT goals: Progressing toward goals (slowly)    Frequency    Min 3X/week      PT Plan Discharge plan needs to be updated;Frequency needs to be updated    Co-evaluation              AM-PAC PT "6 Clicks" Daily Activity  Outcome Measure  Difficulty turning over in bed (including adjusting bedclothes, sheets and blankets)?: Total Difficulty moving from lying on back to sitting on the side of the bed? : Total Difficulty sitting down on and standing up from a chair with arms (e.g., wheelchair, bedside commode, etc,.)?: Total Help needed moving to and from a bed to chair (including a wheelchair)?: Total Help needed walking in hospital room?: Total Help needed climbing 3-5 steps with a railing? : Total 6 Click Score: 6    End of Session Equipment Utilized During Treatment: Oxygen Activity Tolerance: Patient limited by fatigue Patient left: in bed;with call bell/phone within reach;with family/visitor present   PT Visit Diagnosis: Muscle weakness (generalized)  (M62.81)     Time: 1100-1115 PT Time Calculation (min) (ACUTE ONLY): 15 min  Charges:  $Therapeutic Activity: 8-22 mins                    G CodesMagda Kiel, [place] [phone] 09/04/2016    Reginia Naas 09/04/2016, 5:04 PM

## 2016-09-04 NOTE — Progress Notes (Signed)
Pt slept well during the night, Vitals stable, no any sign of SOB and distress noted, no any complain of pain,Oxygen continue via [place] @4l /min, Foley catheter continue, Foley care given, turning and positioning maintained overnight, will continue to monitor the patient.

## 2016-09-05 LAB — BASIC METABOLIC PANEL
Anion gap: 8 (ref 5–15)
BUN: 25 mg/dL — AB (ref 6–20)
CHLORIDE: 90 mmol/L — AB (ref 101–111)
CO2: 38 mmol/L — ABNORMAL HIGH (ref 22–32)
CREATININE: 1.26 mg/dL — AB (ref 0.44–1.00)
Calcium: 9.4 mg/dL (ref 8.9–10.3)
GFR calc Af Amer: 43 mL/min — ABNORMAL LOW (ref >60)
GFR, EST NON AFRICAN AMERICAN: 37 mL/min — AB (ref >60)
Glucose, Bld: 259 mg/dL — ABNORMAL HIGH (ref 65–99)
Potassium: 5.1 mmol/L (ref 3.5–5.1)
SODIUM: 136 mmol/L (ref 135–145)

## 2016-09-05 LAB — GLUCOSE, CAPILLARY
Glucose-Capillary: 237 mg/dL — ABNORMAL HIGH (ref 65–99)
Glucose-Capillary: 213 mg/dL — ABNORMAL HIGH (ref 65–99)
Glucose-Capillary: 213 mg/dL — ABNORMAL HIGH (ref 65–99)
Glucose-Capillary: 225 mg/dL — ABNORMAL HIGH (ref 65–99)

## 2016-09-05 LAB — MAGNESIUM: MAGNESIUM: 1.7 mg/dL (ref 1.7–2.4)

## 2016-09-05 MED ORDER — FUROSEMIDE 10 MG/ML IJ SOLN
60.0000 mg | Freq: Two times a day (BID) | INTRAMUSCULAR | Status: DC
  Administered 2016-09-05 – 2016-09-06 (×4): 60 mg via INTRAVENOUS
  Filled 2016-09-05 (×4): qty 6

## 2016-09-05 MED ORDER — METOLAZONE 2.5 MG PO TABS
2.5000 mg | ORAL_TABLET | Freq: Once | ORAL | Status: AC
  Administered 2016-09-05: 2.5 mg via ORAL
  Filled 2016-09-05: qty 1

## 2016-09-05 MED ORDER — INSULIN GLARGINE 100 UNIT/ML ~~LOC~~ SOLN
40.0000 [IU] | Freq: Every day | SUBCUTANEOUS | Status: DC
  Administered 2016-09-05 – 2016-09-06 (×2): 40 [IU] via SUBCUTANEOUS
  Filled 2016-09-05 (×2): qty 0.4

## 2016-09-05 NOTE — Progress Notes (Signed)
Patient continues to refuse CPAP use. CPAP machine removed from bedside. RT will continue to monitor as needed.

## 2016-09-05 NOTE — Progress Notes (Signed)
PROGRESS NOTE                                                                                                                                                                                                             Patient Demographics:    Elizabeth Pugh, is a 83 y.o. female, DOB - December 25, 1926, QQI:[id number]  Admit date - 08/31/2016   Admitting Physician Ivor Costa, MD  Outpatient Primary MD for the patient is Nolene Ebbs, MD  LOS - 4  Chief Complaint  Patient presents with  . Altered Mental Status       Brief Narrative   Elizabeth Pugh  is a 83 y.o. female, History of morbid obesity, history of stroke now bedbound, chronic systolic and diastolic CHF EF 94% on 4 L nasal cannula oxygen, chronic atrial fibrillation on Eliquis, GERD, DM type 2 insulin-dependent, she was admitted for left leg cellulitis reoccurrence along with acute on chronic combined systolic and diastolic CHF.       Subjective :   Patient in bed, appears comfortable, denies any headache, no fever, no chest pain or pressure, no shortness of breath , no abdominal pain. No focal weakness.        Assessment  & Plan :     1. Fever upon admission unclear etiology, there has been persistent suspicion of recurrent cellulitis, however she has no leukocytosis, legs are not warm, she is completely afebrile at this time and nontoxic. I think most of her problems are form massive edema which predisposes her for mild intermittent low grade cellulitis which responds readily to ABX.   Continue diuresis, weight is 280 pounds, intake and output not properly charted, swelling has improved considerably with diuresis from IV Lasix and Zaroxolyn which will be continued, continue salt and fluid restriction and monitor, clinically cellulitis has completely resolved.   2. Acute on chronic combined systolic diastolic CHF EF 17-40%. Lungs are clear, continue on beta blocker  along with diuretics as above, continue salt and fluid restriction. Currently no shortness of breath.    3. GERDOn PPI and stable.  4. Morbid obesity with obstructive sleep apnea. Daily at bedtime CPAP not complaint, counseled and follow with PCP for weight loss. She remains noncompliant.  5. Chronic atrial fibrillation, history of PE. [place] vasc 2 score of at least 4. Continue Lopressor along with  Eliquis.  6. History of stroke. Supportive skin no acute issues she is now bedbound.   7. DM type II. On Lantus and sliding scale monitor CBGs. Will increase Lantus dose for better control   Lab Results  Component Value Date   HGBA1C 8.7 (H) 08/23/2016   CBG (last 3)   Recent Labs  09/04/16 1706 09/04/16 2054 09/05/16 0738  GLUCAP 178* 204* 237*      Diet : Diet heart healthy/carb modified Room service appropriate? Yes; Fluid consistency: Thin; Fluid restriction: 1500 mL Fluid    Family Communication  :  daughters  Code Status :  Full  Disposition Plan  :  HHPT  Consults  :  None  Procedures  :  None  DVT Prophylaxis  :  Eliquis  Lab Results  Component Value Date   PLT 154 09/01/2016    Inpatient Medications  Scheduled Meds: . apixaban  5 mg Oral BID  . calcium-vitamin D  1 tablet Oral BID  . docusate sodium  200 mg Oral BID  . doxycycline  100 mg Oral Q12H  . ferrous sulfate  325 mg Oral Q supper  . furosemide  60 mg Intravenous BID  . insulin aspart  0-9 Units Subcutaneous TID WC  . insulin glargine  30 Units Subcutaneous QHS  . levETIRAcetam  500 mg Oral BID  . metoprolol  2.5 mg Intravenous Q8H  . pantoprazole  40 mg Oral q morning - 10a  . pregabalin  75 mg Oral QHS  . rOPINIRole  4 mg Oral BID  . sodium chloride  1 spray Each Nare Q4H  . Vitamin D (Ergocalciferol)  50,000 Units Oral Q Wed   Continuous Infusions:  PRN Meds:.acetaminophen **OR** [DISCONTINUED] acetaminophen, albuterol, HYDROcodone-acetaminophen, [DISCONTINUED] ondansetron **OR**  ondansetron (ZOFRAN) IV, polyethylene glycol  Antibiotics  :    Anti-infectives    Start     Dose/Rate Route Frequency Ordered Stop   09/03/16 0800  levofloxacin (LEVAQUIN) IVPB 750 mg  Status:  Discontinued     750 mg 100 mL/hr over 90 Minutes Intravenous Every 48 hours 09/01/16 0555 09/01/16 1129   09/03/16 0600  vancomycin (VANCOCIN) IVPB 1000 mg/200 mL premix  Status:  Discontinued     1,000 mg 200 mL/hr over 60 Minutes Intravenous Every 48 hours 09/01/16 0555 09/02/16 0841   09/02/16 1400  fluconazole (DIFLUCAN) IVPB 200 mg  Status:  Discontinued     200 mg 100 mL/hr over 60 Minutes Intravenous Every 24 hours 09/01/16 1601 09/02/16 0850   09/02/16 1000  doxycycline (VIBRA-TABS) tablet 100 mg     100 mg Oral Every 12 hours 09/02/16 0843     09/01/16 2330  ceFEPIme (MAXIPIME) 2 g in dextrose 5 % 50 mL IVPB  Status:  Discontinued     2 g 100 mL/hr over 30 Minutes Intravenous Every 8 hours 09/01/16 1130 09/01/16 1131   09/01/16 2330  ceFEPIme (MAXIPIME) 2 g in dextrose 5 % 50 mL IVPB  Status:  Discontinued     2 g 100 mL/hr over 30 Minutes Intravenous Every 24 hours 09/01/16 1131 09/02/16 0841   09/01/16 1345  fluconazole (DIFLUCAN) IVPB 400 mg     400 mg 100 mL/hr over 120 Minutes Intravenous Every 24 hours 09/01/16 1338 09/01/16 1618   09/01/16 0800  aztreonam (AZACTAM) 1 g in dextrose 5 % 50 mL IVPB  Status:  Discontinued     1 g 100 mL/hr over 30 Minutes Intravenous Every 8 hours 09/01/16 0555 09/01/16  1129   08/31/16 2359  vancomycin (VANCOCIN) 2,000 mg in sodium chloride 0.9 % 500 mL IVPB     2,000 mg 250 mL/hr over 120 Minutes Intravenous  Once 08/31/16 2328 09/01/16 0310   08/31/16 2330  levofloxacin (LEVAQUIN) IVPB 750 mg     750 mg 100 mL/hr over 90 Minutes Intravenous  Once 08/31/16 2324 09/01/16 0148   08/31/16 2330  aztreonam (AZACTAM) 2 g in dextrose 5 % 50 mL IVPB     2 g 100 mL/hr over 30 Minutes Intravenous  Once 08/31/16 2324 09/01/16 0149   08/31/16 2330   vancomycin (VANCOCIN) IVPB 1000 mg/200 mL premix  Status:  Discontinued     1,000 mg 200 mL/hr over 60 Minutes Intravenous  Once 08/31/16 2324 08/31/16 2328         Objective:   Vitals:   09/04/16 0500 09/04/16 1225 09/04/16 2058 09/05/16 0559  BP: 130/80 105/64 122/77 118/60  Pulse: 100 80 93 96  Resp: 16 18 18    Temp: 98.2 F (36.8 C) 98.4 F (36.9 C) 98.1 F (36.7 C) 97.8 F (36.6 C)  TempSrc: Oral Oral Oral Oral  SpO2: 100% 100% 100% 95%  Weight: 126 kg (277 lb 12.8 oz)   127 kg (280 lb)  Height:        Wt Readings from Last 3 Encounters:  09/05/16 127 kg (280 lb)  08/28/16 121.6 kg (268 lb)  08/13/16 132.5 kg (292 lb 3.2 oz)     Intake/Output Summary (Last 24 hours) at 09/05/16 1026 Last data filed at 09/05/16 1013  Gross per 24 hour  Intake              480 ml  Output             2380 ml  Net            -1900 ml     Physical Exam  Awake Alert, Oriented X 3, No new F.N deficits, Normal affect [place].AT,PERRAL Supple Neck,No JVD, No cervical lymphadenopathy appriciated.  Symmetrical Chest wall movement, Good air movement bilaterally, CTAB RRR,No Gallops,Rubs or new Murmurs, No Parasternal Heave +ve B.Sounds, Abd Soft, No tenderness, No organomegaly appriciated, No rebound - guarding or rigidity. No Cyanosis, Clubbing, 1+ edema , No new Rash or bruise   Data Review:    CBC  Recent Labs Lab 08/31/16 2334 09/01/16 0545  WBC 5.5 5.8  HGB 11.7* 10.9*  HCT 39.0 35.0*  PLT 184 154  MCV 96.3 95.9  MCH 28.9 29.9  MCHC 30.0 31.1  RDW 14.4 14.2  LYMPHSABS 1.1  --   MONOABS 0.4  --   EOSABS 0.0  --   BASOSABS 0.0  --     Chemistries   Recent Labs Lab 08/31/16 2334 09/01/16 0545 09/02/16 0731 09/03/16 0337 09/04/16 0338 09/05/16 0142  NA 138 140 141 137 137 136  K 4.5 4.7 3.8 4.1 4.5 5.1  CL 94* 100* 103 96* 94* 90*  CO2 33* 27 31 35* 34* 38*  GLUCOSE 206* 177* 94 129* 170* 259*  BUN 28* 27* 21* 19 18 25*  CREATININE 1.32* 1.25* 1.06* 0.98  1.08* 1.26*  CALCIUM 9.3 8.6* 8.6* 8.9 9.3 9.4  MG  --   --   --  1.8 1.8 1.7  AST 22  --  16  --   --   --   ALT 12*  --  10*  --   --   --   ALKPHOS 52  --  38  --   --   --   BILITOT 1.0  --  0.9  --   --   --    ------------------------------------------------------------------------------------------------------------------ No results for input(s): CHOL, HDL, LDLCALC, TRIG, CHOLHDL, LDLDIRECT in the last 72 hours.  Lab Results  Component Value Date   HGBA1C 8.7 (H) 08/23/2016   ------------------------------------------------------------------------------------------------------------------ No results for input(s): TSH, T4TOTAL, T3FREE, THYROIDAB in the last 72 hours.  Invalid input(s): FREET3 ------------------------------------------------------------------------------------------------------------------ No results for input(s): VITAMINB12, FOLATE, FERRITIN, TIBC, IRON, RETICCTPCT in the last 72 hours.  Coagulation profile  Recent Labs Lab 09/01/16 0318  INR 1.45    No results for input(s): DDIMER in the last 72 hours.  Cardiac Enzymes No results for input(s): CKMB, TROPONINI, MYOGLOBIN in the last 168 hours.  Invalid input(s): CK ------------------------------------------------------------------------------------------------------------------    Component Value Date/Time   BNP 187.2 (H) 09/01/2016 0602    Micro Results Recent Results (from the past 240 hour(s))  Culture, blood (Routine x 2)     Status: None (Preliminary result)   Collection Time: 08/31/16 11:30 PM  Result Value Ref Range Status   Specimen Description BLOOD LEFT HAND  Final   Special Requests IN PEDIATRIC BOTTLE Blood Culture adequate volume  Final   Culture NO GROWTH 3 DAYS  Final   Report Status PENDING  Incomplete  Culture, blood (Routine x 2)     Status: None (Preliminary result)   Collection Time: 08/31/16 11:35 PM  Result Value Ref Range Status   Specimen Description BLOOD RIGHT HAND   Final   Special Requests IN PEDIATRIC BOTTLE Blood Culture adequate volume  Final   Culture NO GROWTH 3 DAYS  Final   Report Status PENDING  Incomplete  Urine culture     Status: None   Collection Time: 09/01/16  1:43 AM  Result Value Ref Range Status   Specimen Description URINE, RANDOM  Final   Special Requests NONE  Final   Culture NO GROWTH  Final   Report Status 09/02/2016 FINAL  Final    Radiology Reports Dg Chest Port 1 View  Result Date: 08/31/2016 CLINICAL DATA:  Altered mental status.  Fever and dyspnea. EXAM: PORTABLE CHEST 1 VIEW COMPARISON:  08/11/2016 FINDINGS: Unchanged moderate cardiomegaly. Mild vascular prominence, stable and likely chronic. Mild pleural thickening in the lateral and apical regions bilaterally, chronic. No confluent airspace consolidation. IMPRESSION: Stable cardiomegaly. Stable mild vascular prominence. No consolidation or large effusion. Electronically Signed   By: Andreas Newport M.D.   On: 08/31/2016 23:58   Portable Chest 1 View  Result Date: 08/11/2016 CLINICAL DATA:  Acute respiratory failure with hypoxia EXAM: PORTABLE CHEST 1 VIEW COMPARISON:  08/10/2016 FINDINGS: Cardiomegaly with vascular congestion. Low lung volumes. Small right pleural effusion again noted. No confluent opacities or edema. IMPRESSION: Cardiomegaly with vascular congestion.  Stable small right effusion. Electronically Signed   By: Rolm Baptise M.D.   On: 08/11/2016 07:18   Dg Chest Portable 1 View  Result Date: 08/10/2016 CLINICAL DATA:  Shortness of breath and cellulitis EXAM: PORTABLE CHEST 1 VIEW COMPARISON:  08/10/2016 FINDINGS: Cardiac shadow remains enlarged. The lungs are well aerated bilaterally. Some blunting of the right costophrenic angle consistent with small effusion on the right is noted better visualize from the prior exam. No new focal infiltrate is seen. IMPRESSION: Small right effusion.  No other new focal abnormality is seen. Electronically Signed   By: Inez Catalina M.D.   On: 08/10/2016 19:22   Dg Chest [facility]  Result Date: 08/10/2016 CLINICAL DATA:  Tachycardia and lethargy.  Fever. EXAM: PORTABLE CHEST 1 VIEW COMPARISON:  July 13, 2016 FINDINGS: There is no edema or consolidation. Heart is enlarged with pulmonary vascularity within normal limits. No adenopathy. There is atherosclerotic calcification in the aorta. There is evidence of old trauma involving the left clavicle. IMPRESSION: Cardiomegaly. No edema or consolidation. There is aortic atherosclerosis. Electronically Signed   By: Lowella Grip III M.D.   On: 08/10/2016 12:33    Time Spent in minutes  30   Lala Lund M.D on 09/05/2016 at 10:26 AM  Between 7am to 7pm - Pager - [phone] ( page via [facility], text pages only, please mention full 10 digit call back number). After 7pm go to [email or web address] - password [facility]

## 2016-09-06 DIAGNOSIS — J9612 Chronic respiratory failure with hypercapnia: Secondary | ICD-10-CM

## 2016-09-06 DIAGNOSIS — E118 Type 2 diabetes mellitus with unspecified complications: Secondary | ICD-10-CM

## 2016-09-06 DIAGNOSIS — E1165 Type 2 diabetes mellitus with hyperglycemia: Secondary | ICD-10-CM

## 2016-09-06 DIAGNOSIS — J9611 Chronic respiratory failure with hypoxia: Secondary | ICD-10-CM

## 2016-09-06 LAB — CULTURE, BLOOD (ROUTINE X 2)
CULTURE: NO GROWTH
Culture: NO GROWTH
Special Requests: ADEQUATE
Special Requests: ADEQUATE

## 2016-09-06 LAB — BASIC METABOLIC PANEL WITH GFR
Anion gap: 8 (ref 5–15)
BUN: 25 mg/dL — ABNORMAL HIGH (ref 6–20)
CO2: 41 mmol/L — ABNORMAL HIGH (ref 22–32)
Calcium: 9.3 mg/dL (ref 8.9–10.3)
Chloride: 89 mmol/L — ABNORMAL LOW (ref 101–111)
Creatinine, Ser: 1.27 mg/dL — ABNORMAL HIGH (ref 0.44–1.00)
GFR calc Af Amer: 42 mL/min — ABNORMAL LOW
GFR calc non Af Amer: 37 mL/min — ABNORMAL LOW
Glucose, Bld: 156 mg/dL — ABNORMAL HIGH (ref 65–99)
Potassium: 3.9 mmol/L (ref 3.5–5.1)
Sodium: 138 mmol/L (ref 135–145)

## 2016-09-06 LAB — GLUCOSE, CAPILLARY
GLUCOSE-CAPILLARY: 127 mg/dL — AB (ref 65–99)
GLUCOSE-CAPILLARY: 233 mg/dL — AB (ref 65–99)
Glucose-Capillary: 215 mg/dL — ABNORMAL HIGH (ref 65–99)
Glucose-Capillary: 225 mg/dL — ABNORMAL HIGH (ref 65–99)

## 2016-09-06 MED ORDER — METOLAZONE 2.5 MG PO TABS
2.5000 mg | ORAL_TABLET | Freq: Once | ORAL | Status: AC
  Administered 2016-09-06: 2.5 mg via ORAL
  Filled 2016-09-06: qty 1

## 2016-09-06 MED ORDER — DM-GUAIFENESIN ER 30-600 MG PO TB12
1.0000 | ORAL_TABLET | Freq: Two times a day (BID) | ORAL | Status: DC
  Administered 2016-09-06 – 2016-09-07 (×3): 1 via ORAL
  Filled 2016-09-06 (×3): qty 1

## 2016-09-06 MED ORDER — MAGNESIUM SULFATE IN D5W 1-5 GM/100ML-% IV SOLN
1.0000 g | Freq: Once | INTRAVENOUS | Status: AC
  Administered 2016-09-06: 1 g via INTRAVENOUS
  Filled 2016-09-06: qty 100

## 2016-09-06 MED ORDER — POTASSIUM CHLORIDE CRYS ER 20 MEQ PO TBCR
20.0000 meq | EXTENDED_RELEASE_TABLET | Freq: Once | ORAL | Status: AC
  Administered 2016-09-06: 20 meq via ORAL
  Filled 2016-09-06: qty 1

## 2016-09-06 NOTE — Progress Notes (Signed)
PROGRESS NOTE                                                                                                                                                                                                             Patient Demographics:    Elizabeth Pugh, is a 83 y.o. female, DOB - 1927/05/04, TJQ:[id number]  Admit date - 08/31/2016   Admitting Physician Ivor Costa, MD  Outpatient Primary MD for the patient is Nolene Ebbs, MD  LOS - 5  Chief Complaint  Patient presents with  . Altered Mental Status       Brief Narrative   Elizabeth Pugh  is a 83 y.o. female, History of morbid obesity, history of stroke now bedbound, chronic systolic and diastolic CHF EF 76% on 4 L nasal cannula oxygen, chronic atrial fibrillation on Eliquis, GERD, DM type 2 insulin-dependent, she was admitted for left leg cellulitis reoccurrence along with acute on chronic combined systolic and diastolic CHF.       Subjective :   Patient in bed, appears comfortable, denies any headache, no fever, no chest pain or pressure, no shortness of breath , no abdominal pain. No focal weakness.        Assessment  & Plan :     1. Fever upon admission unclear etiology, there has been persistent suspicion of recurrent cellulitis, however she has no leukocytosis, legs are not warm, she is completely afebrile at this time and nontoxic. I think most of her problems are form massive edema which predisposes her for mild intermittent low grade cellulitis which responds readily to ABX.   Continue diuresis with IV Lasix and oral Zaroxolyn, clinically edema has gone down considerably, weight has not been charted correctly it is fluctuating between 290 and 270 pounds, it was 280 pounds on 09/05/2016 and with 2-1/2 L diuresis it is up to 86 pounds on 09/06/2016. Overall she has been extremely well diuresed, edema is now trace in lower extremities, continue salt and fluid  restriction and diuresis. Will have dietary educated patient and family on salt restriction and fluid restriction due to recurrent issues with edema. Likely discharge in the morning.  2. Acute on chronic combined systolic diastolic CHF EF 22-63%. Now only trace peripheral edema, no shortness of breath, lungs are clear in exam, continue beta blocker and diuretics along with fluid and  salt restriction.   3. GERD - stable on PPI.  4. Morbid obesity with obstructive sleep apnea. Despite extensive counseling she remains noncompliant with CPAP, follow with PCP for weight loss.  5. Chronic atrial fibrillation, history of PE. [place] vasc 2 score of at least 4. Goal is rate controlled continue combination of Lopressor and Eliquis.  6. History of stroke. She is bedbound, on supportive care and Eliquis.   7. DM type II. Lantus dose adjusted, continue sliding scale CBGs well-controlled  Lab Results  Component Value Date   HGBA1C 8.7 (H) 08/23/2016   CBG (last 3)   Recent Labs  09/05/16 1636 09/05/16 2201 09/06/16 0804  GLUCAP 213* 225* 127*     Diet : Diet heart healthy/carb modified Room service appropriate? Yes; Fluid consistency: Thin; Fluid restriction: 1500 mL Fluid    Family Communication  :  daughters  Code Status :  Full  Disposition Plan  :  HHPT  Consults  :  None  Procedures  :  None  DVT Prophylaxis  :  Eliquis  Lab Results  Component Value Date   PLT 154 09/01/2016    Inpatient Medications  Scheduled Meds: . apixaban  5 mg Oral BID  . calcium-vitamin D  1 tablet Oral BID  . docusate sodium  200 mg Oral BID  . doxycycline  100 mg Oral Q12H  . ferrous sulfate  325 mg Oral Q supper  . furosemide  60 mg Intravenous BID  . insulin aspart  0-9 Units Subcutaneous TID WC  . insulin glargine  40 Units Subcutaneous QHS  . levETIRAcetam  500 mg Oral BID  . metoprolol  2.5 mg Intravenous Q8H  . pantoprazole  40 mg Oral q morning - 10a  . pregabalin  75 mg Oral  QHS  . rOPINIRole  4 mg Oral BID  . sodium chloride  1 spray Each Nare Q4H  . Vitamin D (Ergocalciferol)  50,000 Units Oral Q Wed   Continuous Infusions:  PRN Meds:.acetaminophen **OR** [DISCONTINUED] acetaminophen, albuterol, HYDROcodone-acetaminophen, [DISCONTINUED] ondansetron **OR** ondansetron (ZOFRAN) IV, polyethylene glycol  Antibiotics  :    Anti-infectives    Start     Dose/Rate Route Frequency Ordered Stop   09/03/16 0800  levofloxacin (LEVAQUIN) IVPB 750 mg  Status:  Discontinued     750 mg 100 mL/hr over 90 Minutes Intravenous Every 48 hours 09/01/16 0555 09/01/16 1129   09/03/16 0600  vancomycin (VANCOCIN) IVPB 1000 mg/200 mL premix  Status:  Discontinued     1,000 mg 200 mL/hr over 60 Minutes Intravenous Every 48 hours 09/01/16 0555 09/02/16 0841   09/02/16 1400  fluconazole (DIFLUCAN) IVPB 200 mg  Status:  Discontinued     200 mg 100 mL/hr over 60 Minutes Intravenous Every 24 hours 09/01/16 1601 09/02/16 0850   09/02/16 1000  doxycycline (VIBRA-TABS) tablet 100 mg     100 mg Oral Every 12 hours 09/02/16 0843     09/01/16 2330  ceFEPIme (MAXIPIME) 2 g in dextrose 5 % 50 mL IVPB  Status:  Discontinued     2 g 100 mL/hr over 30 Minutes Intravenous Every 8 hours 09/01/16 1130 09/01/16 1131   09/01/16 2330  ceFEPIme (MAXIPIME) 2 g in dextrose 5 % 50 mL IVPB  Status:  Discontinued     2 g 100 mL/hr over 30 Minutes Intravenous Every 24 hours 09/01/16 1131 09/02/16 0841   09/01/16 1345  fluconazole (DIFLUCAN) IVPB 400 mg     400 mg 100 mL/hr over  120 Minutes Intravenous Every 24 hours 09/01/16 1338 09/01/16 1618   09/01/16 0800  aztreonam (AZACTAM) 1 g in dextrose 5 % 50 mL IVPB  Status:  Discontinued     1 g 100 mL/hr over 30 Minutes Intravenous Every 8 hours 09/01/16 0555 09/01/16 1129   08/31/16 2359  vancomycin (VANCOCIN) 2,000 mg in sodium chloride 0.9 % 500 mL IVPB     2,000 mg 250 mL/hr over 120 Minutes Intravenous  Once 08/31/16 2328 09/01/16 0310   08/31/16 2330   levofloxacin (LEVAQUIN) IVPB 750 mg     750 mg 100 mL/hr over 90 Minutes Intravenous  Once 08/31/16 2324 09/01/16 0148   08/31/16 2330  aztreonam (AZACTAM) 2 g in dextrose 5 % 50 mL IVPB     2 g 100 mL/hr over 30 Minutes Intravenous  Once 08/31/16 2324 09/01/16 0149   08/31/16 2330  vancomycin (VANCOCIN) IVPB 1000 mg/200 mL premix  Status:  Discontinued     1,000 mg 200 mL/hr over 60 Minutes Intravenous  Once 08/31/16 2324 08/31/16 2328         Objective:   Vitals:   09/05/16 0559 09/05/16 1223 09/05/16 2203 09/06/16 0644  BP: 118/60 121/75 (!) 128/92 131/72  Pulse: 96 83 80 100  Resp:  20 20 18   Temp: 97.8 F (36.6 C) 97.7 F (36.5 C) 98.1 F (36.7 C) 97.7 F (36.5 C)  TempSrc: Oral Oral Oral Oral  SpO2: 95% 98% 98% 100%  Weight: 127 kg (280 lb)   129.7 kg (286 lb)  Height:        Wt Readings from Last 3 Encounters:  09/06/16 129.7 kg (286 lb)  08/28/16 121.6 kg (268 lb)  08/13/16 132.5 kg (292 lb 3.2 oz)     Intake/Output Summary (Last 24 hours) at 09/06/16 1035 Last data filed at 09/06/16 0400  Gross per 24 hour  Intake              800 ml  Output             2350 ml  Net            -1550 ml     Physical Exam  Awake Alert, Oriented X 3, No new F.N deficits, Normal affect [place].AT,PERRAL Supple Neck,No JVD, No cervical lymphadenopathy appriciated.  Symmetrical Chest wall movement, Good air movement bilaterally, CTAB RRR,No Gallops,Rubs or new Murmurs, No Parasternal Heave +ve B.Sounds, Abd Soft, No tenderness, No organomegaly appriciated, No rebound - guarding or rigidity. No Cyanosis, Clubbing , trace edema, No new Rash or bruise    Data Review:    CBC  Recent Labs Lab 08/31/16 2334 09/01/16 0545  WBC 5.5 5.8  HGB 11.7* 10.9*  HCT 39.0 35.0*  PLT 184 154  MCV 96.3 95.9  MCH 28.9 29.9  MCHC 30.0 31.1  RDW 14.4 14.2  LYMPHSABS 1.1  --   MONOABS 0.4  --   EOSABS 0.0  --   BASOSABS 0.0  --     Chemistries   Recent Labs Lab  08/31/16 2334  09/02/16 0731 09/03/16 0337 09/04/16 0338 09/05/16 0142 09/06/16 0353  NA 138  < > 141 137 137 136 138  K 4.5  < > 3.8 4.1 4.5 5.1 3.9  CL 94*  < > 103 96* 94* 90* 89*  CO2 33*  < > 31 35* 34* 38* 41*  GLUCOSE 206*  < > 94 129* 170* 259* 156*  BUN 28*  < > 21* 19 18  25* 25*  CREATININE 1.32*  < > 1.06* 0.98 1.08* 1.26* 1.27*  CALCIUM 9.3  < > 8.6* 8.9 9.3 9.4 9.3  MG  --   --   --  1.8 1.8 1.7  --   AST 22  --  16  --   --   --   --   ALT 12*  --  10*  --   --   --   --   ALKPHOS 52  --  38  --   --   --   --   BILITOT 1.0  --  0.9  --   --   --   --   < > = values in this interval not displayed. ------------------------------------------------------------------------------------------------------------------ No results for input(s): CHOL, HDL, LDLCALC, TRIG, CHOLHDL, LDLDIRECT in the last 72 hours.  Lab Results  Component Value Date   HGBA1C 8.7 (H) 08/23/2016   ------------------------------------------------------------------------------------------------------------------ No results for input(s): TSH, T4TOTAL, T3FREE, THYROIDAB in the last 72 hours.  Invalid input(s): FREET3 ------------------------------------------------------------------------------------------------------------------ No results for input(s): VITAMINB12, FOLATE, FERRITIN, TIBC, IRON, RETICCTPCT in the last 72 hours.  Coagulation profile  Recent Labs Lab 09/01/16 0318  INR 1.45    No results for input(s): DDIMER in the last 72 hours.  Cardiac Enzymes No results for input(s): CKMB, TROPONINI, MYOGLOBIN in the last 168 hours.  Invalid input(s): CK ------------------------------------------------------------------------------------------------------------------    Component Value Date/Time   BNP 187.2 (H) 09/01/2016 0602    Micro Results Recent Results (from the past 240 hour(s))  Culture, blood (Routine x 2)     Status: None (Preliminary result)   Collection Time: 08/31/16  11:30 PM  Result Value Ref Range Status   Specimen Description BLOOD LEFT HAND  Final   Special Requests IN PEDIATRIC BOTTLE Blood Culture adequate volume  Final   Culture NO GROWTH 4 DAYS  Final   Report Status PENDING  Incomplete  Culture, blood (Routine x 2)     Status: None (Preliminary result)   Collection Time: 08/31/16 11:35 PM  Result Value Ref Range Status   Specimen Description BLOOD RIGHT HAND  Final   Special Requests IN PEDIATRIC BOTTLE Blood Culture adequate volume  Final   Culture NO GROWTH 4 DAYS  Final   Report Status PENDING  Incomplete  Urine culture     Status: None   Collection Time: 09/01/16  1:43 AM  Result Value Ref Range Status   Specimen Description URINE, RANDOM  Final   Special Requests NONE  Final   Culture NO GROWTH  Final   Report Status 09/02/2016 FINAL  Final    Radiology Reports Dg Chest Port 1 View  Result Date: 08/31/2016 CLINICAL DATA:  Altered mental status.  Fever and dyspnea. EXAM: PORTABLE CHEST 1 VIEW COMPARISON:  08/11/2016 FINDINGS: Unchanged moderate cardiomegaly. Mild vascular prominence, stable and likely chronic. Mild pleural thickening in the lateral and apical regions bilaterally, chronic. No confluent airspace consolidation. IMPRESSION: Stable cardiomegaly. Stable mild vascular prominence. No consolidation or large effusion. Electronically Signed   By: Andreas Newport M.D.   On: 08/31/2016 23:58   Portable Chest 1 View  Result Date: 08/11/2016 CLINICAL DATA:  Acute respiratory failure with hypoxia EXAM: PORTABLE CHEST 1 VIEW COMPARISON:  08/10/2016 FINDINGS: Cardiomegaly with vascular congestion. Low lung volumes. Small right pleural effusion again noted. No confluent opacities or edema. IMPRESSION: Cardiomegaly with vascular congestion.  Stable small right effusion. Electronically Signed   By: Rolm Baptise M.D.   On: 08/11/2016 07:18  Dg Chest Portable 1 View  Result Date: 08/10/2016 CLINICAL DATA:  Shortness of breath and  cellulitis EXAM: PORTABLE CHEST 1 VIEW COMPARISON:  08/10/2016 FINDINGS: Cardiac shadow remains enlarged. The lungs are well aerated bilaterally. Some blunting of the right costophrenic angle consistent with small effusion on the right is noted better visualize from the prior exam. No new focal infiltrate is seen. IMPRESSION: Small right effusion.  No other new focal abnormality is seen. Electronically Signed   By: Inez Catalina M.D.   On: 08/10/2016 19:22   Dg Chest Port 1 View  Result Date: 08/10/2016 CLINICAL DATA:  Tachycardia and lethargy.  Fever. EXAM: PORTABLE CHEST 1 VIEW COMPARISON:  July 13, 2016 FINDINGS: There is no edema or consolidation. Heart is enlarged with pulmonary vascularity within normal limits. No adenopathy. There is atherosclerotic calcification in the aorta. There is evidence of old trauma involving the left clavicle. IMPRESSION: Cardiomegaly. No edema or consolidation. There is aortic atherosclerosis. Electronically Signed   By: Lowella Grip III M.D.   On: 08/10/2016 12:33    Time Spent in minutes  30   Lala Lund M.D on 09/06/2016 at 10:35 AM  Between 7am to 7pm - Pager - [phone] ( page via [facility], text pages only, please mention full 10 digit call back number). After 7pm go to [email or web address] - password [facility]

## 2016-09-06 NOTE — Progress Notes (Signed)
Patient refuses the use of CPAP 

## 2016-09-06 NOTE — Progress Notes (Signed)
Pt coughing up thick clear mucus, daughter at there bedside and requested she get back on her mucinex Md made aware. Orders placed. Daughter and pts sister at the bedside.

## 2016-09-07 LAB — BASIC METABOLIC PANEL
ANION GAP: 9 (ref 5–15)
BUN: 32 mg/dL — ABNORMAL HIGH (ref 6–20)
CO2: 41 mmol/L — ABNORMAL HIGH (ref 22–32)
Calcium: 9.1 mg/dL (ref 8.9–10.3)
Chloride: 86 mmol/L — ABNORMAL LOW (ref 101–111)
Creatinine, Ser: 1.35 mg/dL — ABNORMAL HIGH (ref 0.44–1.00)
GFR, EST AFRICAN AMERICAN: 39 mL/min — AB (ref >60)
GFR, EST NON AFRICAN AMERICAN: 34 mL/min — AB (ref >60)
Glucose, Bld: 242 mg/dL — ABNORMAL HIGH (ref 65–99)
POTASSIUM: 3.8 mmol/L (ref 3.5–5.1)
SODIUM: 136 mmol/L (ref 135–145)

## 2016-09-07 LAB — GLUCOSE, CAPILLARY
Glucose-Capillary: 255 mg/dL — ABNORMAL HIGH (ref 65–99)
Glucose-Capillary: 243 mg/dL — ABNORMAL HIGH (ref 65–99)

## 2016-09-07 LAB — MAGNESIUM: Magnesium: 1.8 mg/dL (ref 1.7–2.4)

## 2016-09-07 MED ORDER — FUROSEMIDE 80 MG PO TABS: 80.0000 mg | ORAL_TABLET | Freq: Two times a day (BID) | ORAL | 0 refills | Status: DC

## 2016-09-07 MED ORDER — FUROSEMIDE 80 MG PO TABS
80.0000 mg | ORAL_TABLET | Freq: Two times a day (BID) | ORAL | Status: DC
  Administered 2016-09-07: 80 mg via ORAL
  Filled 2016-09-07: qty 1

## 2016-09-07 MED ORDER — METOLAZONE 2.5 MG PO TABS: 2.5000 mg | ORAL_TABLET | ORAL | 0 refills | Status: DC

## 2016-09-07 NOTE — Progress Notes (Signed)
Patient is for discharge home today; [place] arranged with [facility] [place] [facility]); transportation will be provided by [facility] ambulance; CM talked to patient verified home address; her daughter will be home at arrival time; Ambulance pick up time scheduled for 12 noon. Patient's daughter called - [name] (Daughter) is aware of discharge today and will be home to open the door; [employer]

## 2016-09-07 NOTE — Discharge Summary (Signed)
[name] HER:[id number] DOB: 01-25-1927 DOA: 08/31/2016  PCP: Nolene Ebbs, MD  Admit date: 08/31/2016  Discharge date: 09/07/2016  Admitted From: Home   Disposition:  Home   Recommendations for Outpatient Follow-up:   Follow up with PCP in 1-2 weeks  PCP Please obtain BMP/CBC, 2 view CXR in 1week,  (see Discharge instructions)   PCP Please follow up on the following pending results: Monitor BMP, weight and diuretic dose closely, needs continued education with fluid and salt restriction.   Home Health: PT, RN, aide, social work   Equipment/Devices: None Consultations: Dietitian to educate on fluid and salt restriction Discharge Condition: Fair  CODE STATUS: Full   Diet Recommendation: Diet heart healthy/carb modified - Fluid restriction: 1500 mL / day   Chief Complaint  Patient presents with  . Altered Mental Status     Brief history of present illness from the day of admission and additional interim summary     DelseneHauseris a 83 y.o.female,History of morbid obesity, history of stroke now bedbound, chronic systolic and diastolic CHF EF 85% on 4 L nasal cannula oxygen, chronic atrial fibrillation on Eliquis, GERD, DM type 2 insulin-dependent, she was admitted for left leg cellulitis reoccurrence along with acute on chronic combined systolic and diastolic CHF.                                                                 Hospital Course   1. Fever upon admission unclear etiology, there has been persistent suspicion of recurrent cellulitis, however she has no leukocytosis, legs are not warm, she is completely afebrile at this time and nontoxic. I think most of her problems are form massive edema which predisposes her for mild intermittent low grade cellulitis which responds readily to ABX.    Continue diuresis with IV Lasix and oral Zaroxolyn, clinically edema has gone down considerably, weight has not been charted correctly Having said that today's weight is 280 pounds. Overall she has been extremely well diuresed, edema is now trace from 3+ in lower extremities and abdominal wall, continue salt and fluid restriction and diuresis. Will have dietary educated patient and family on salt restriction and fluid restriction due to recurrent issues with edema. I've also adjusted her diuretic dosage, request PCP to monitor BMP, edema and diuretics dose closely over the next one month. Clinically all infection has resolved and she has completed antibiotic course.   2.Acute on chronic combined systolic diastolic CHF EF 63-14%. Now only trace peripheral edema, no shortness of breath, lungs are clear in exam, continue beta blocker and diuretics along with fluid and salt restriction. Diuretic dose adjusted, she and her daughter were provided with dietary consultation for fluid and salt restriction, home has her and also to do the same. Request PCP to monitor BMP, edema and diuretic  dose closely in the outpatient setting.  3. GERD - stable on PPI.  4.Morbid obesity with obstructive sleep apnea. Despite extensive counseling she remains noncompliant with CPAP, follow with PCP for weight loss.  5.Chronic atrial fibrillation, history of PE. [place] vasc 2 score of at least 4. Goal is rate controlled continue combination of Lopressor and Eliquis.  6.History of stroke. She is bedbound, on supportive care and Eliquis.   7. DM type II. Continue home regimen and follow with PCP for glycemic control.   Discharge diagnosis     Principal Problem:   Sepsis ([place]) Active Problems:   DM (diabetes mellitus), type 2, uncontrolled with complications (HCC)   CVA, old, hemiparesis ([place])   Hypertension   Atrial fibrillation with rapid ventricular response (HCC)   Chronic combined systolic and diastolic  heart failure (HCC)   Acute encephalopathy   Chronic respiratory failure with hypoxia and hypercapnia (HCC)   Morbid obesity with BMI of 45.0-49.9, adult (HCC)   Cellulitis of left lower extremity    Discharge instructions    Discharge Instructions    Discharge instructions    Complete by:  As directed    Follow with Primary MD Nolene Ebbs, MD in 3-4 days   Get CBC, CMP, 2 view Chest X ray checked  by Primary MD  in 3-4 days ( we routinely change or add medications that can affect your baseline labs and fluid status, therefore we recommend that you get the mentioned basic workup next visit with your PCP, your PCP may decide not to get them or add new tests based on their clinical decision)  Activity: As tolerated with Full fall precautions use walker/cane & assistance as needed  Disposition Home    Diet:   Diet heart healthy/carb modified Fluid restriction: 1500 mL/ day    For Heart failure patients - Check your Weight same time everyday, if you gain over 2 pounds, or you develop in leg swelling, experience more shortness of breath or chest pain, call your Primary MD immediately. Follow Cardiac Low Salt Diet and 1.5 lit/day fluid restriction.  On your next visit with your primary care physician please Get Medicines reviewed and adjusted.  Please request your Prim.MD to go over all Hospital Tests and Procedure/Radiological results at the follow up, please get all Hospital records sent to your Prim MD by signing hospital release before you go home.  If you experience worsening of your admission symptoms, develop shortness of breath, life threatening emergency, suicidal or homicidal thoughts you must seek medical attention immediately by calling 911 or calling your MD immediately  if symptoms less severe.  You Must read complete instructions/literature along with all the possible adverse reactions/side effects for all the Medicines you take and that have been prescribed to you. Take  any new Medicines after you have completely understood and accpet all the possible adverse reactions/side effects.   Do not drive, operate heavy machinery, perform activities at heights, swimming or participation in water activities or provide baby sitting services if your were admitted for syncope or siezures until you have seen by Primary MD or a Neurologist and advised to do so again.  Do not drive when taking Pain medications.    Do not take more than prescribed Pain, Sleep and Anxiety Medications  Special Instructions: If you have smoked or chewed Tobacco  in the last 2 yrs please stop smoking, stop any regular Alcohol  and or any Recreational drug use.  Wear Seat belts while driving.  Please note  You were cared for by a hospitalist during your hospital stay. If you have any questions about your discharge medications or the care you received while you were in the hospital after you are discharged, you can call the unit and asked to speak with the hospitalist on call if the hospitalist that took care of you is not available. Once you are discharged, your primary care physician will handle any further medical issues. Please note that NO REFILLS for any discharge medications will be authorized once you are discharged, as it is imperative that you return to your primary care physician (or establish a relationship with a primary care physician if you do not have one) for your aftercare needs so that they can reassess your need for medications and monitor your lab values.   Increase activity slowly    Complete by:  As directed       Discharge Medications   Allergies as of 09/07/2016      Reactions   Penicillins Rash   Has patient had a PCN reaction causing immediate rash, facial/tongue/throat swelling, SOB or lightheadedness with hypotension: Yes Has patient had a PCN reaction causing severe rash involving mucus membranes or skin necrosis: No Has patient had a PCN reaction that required  hospitalization: No Has patient had a PCN reaction occurring within the last 10 years: No If all of the above answers are "NO", then may proceed with Cephalosporin use. *Tolerates cephalosporins   Erythromycin Itching, Other (See Comments)   Vaginal itching   Zithromax [azithromycin] Other (See Comments)   Caused a yeast infection      Medication List    TAKE these medications   acetaminophen 325 MG tablet Commonly known as:  TYLENOL Take 325-650 mg by mouth every 6 (six) hours as needed (for pain).   albuterol (2.5 MG/3ML) 0.083% nebulizer solution Commonly known as:  PROVENTIL Take 2.5 mg by nebulization every 6 (six) hours as needed for wheezing or shortness of breath.   albuterol 108 (90 Base) MCG/ACT inhaler Commonly known as:  PROVENTIL HFA;VENTOLIN HFA Inhale 1 puff into the lungs every 6 (six) hours as needed for wheezing or shortness of breath.   ALLERGY EYE OP Place 1 drop into both eyes every 8 (eight) hours as needed (For eye irritation.).   apixaban 5 MG Tabs tablet Commonly known as:  ELIQUIS Take 1 tablet (5 mg total) by mouth 2 (two) times daily.   CALCIUM 500+D 500-400 MG-UNIT tablet Generic drug:  calcium-vitamin D Take 1 tablet by mouth 2 (two) times daily.   dextromethorphan-guaiFENesin 30-600 MG 12hr tablet Commonly known as:  MUCINEX DM Take 1 tablet by mouth 2 (two) times daily as needed for cough.   docusate sodium 100 MG capsule Commonly known as:  COLACE Take 100 mg by mouth daily as needed for mild constipation.   ferrous sulfate 325 (65 FE) MG tablet Take 325 mg by mouth daily with supper.   furosemide 80 MG tablet Commonly known as:  LASIX Take 1 tablet (80 mg total) by mouth 2 (two) times daily. What changed:  medication strength  how much to take   HUMALOG KWIKPEN 100 UNIT/ML KiwkPen Generic drug:  insulin lispro Inject 2-8 Units into the skin 3 (three) times daily before meals. PER SLIDING SCALE (nothing if BGL is 150 or less):  0-150 = 0 units, 151-200 = 2 units, 201-250 = 4 units, 251-300 = 6 units, 301-350 = 8 units, 351-400 = 10 units, 400+ = 12  units and notify NP/MD   hydrOXYzine 25 MG tablet Commonly known as:  ATARAX/VISTARIL Take 25 mg by mouth every 8 (eight) hours as needed for itching.   insulin glargine 100 UNIT/ML injection Commonly known as:  LANTUS Inject 0.4 mLs (40 Units total) into the skin at bedtime.   ipratropium-albuterol 0.5-2.5 (3) MG/3ML Soln Commonly known as:  DUONEB Take 3 mLs by nebulization every 6 (six) hours as needed (for wheezing or shortness of breath).   levETIRAcetam 500 MG tablet Commonly known as:  KEPPRA Take 1 tablet (500 mg total) by mouth 2 (two) times daily.   metolazone 2.5 MG tablet Commonly known as:  ZAROXOLYN Take 1 tablet (2.5 mg total) by mouth every Wednesday. Take an extra potassium pill the day you takes her oxygen Start taking on:  09/09/2016   metoprolol tartrate 25 MG tablet Commonly known as:  LOPRESSOR Take 0.5 tablets (12.5 mg total) by mouth 2 (two) times daily.   pantoprazole 40 MG tablet Commonly known as:  PROTONIX Take 1 tablet (40 mg total) by mouth 2 (two) times daily before a meal. What changed:  when to take this   polyethylene glycol packet Commonly known as:  MIRALAX / GLYCOLAX Take 17 g by mouth daily as needed for mild constipation.   potassium chloride SA 20 MEQ tablet Commonly known as:  K-DUR,KLOR-CON Take 1 tablet (20 mEq total) by mouth daily.   pregabalin 75 MG capsule Commonly known as:  LYRICA Take 75 mg by mouth at bedtime.   rOPINIRole 2 MG tablet Commonly known as:  REQUIP Take 1 tablet (2 mg total) by mouth 3 (three) times daily. What changed:  how much to take  when to take this   Vitamin D (Ergocalciferol) 50000 units Caps capsule Commonly known as:  DRISDOL Take 50,000 Units by mouth every 7 (seven) days.   VOLTAREN 1 % Gel Generic drug:  diclofenac sodium Apply 1 application topically 4 (four)  times daily as needed (For knee pain.).       Follow-up Information    [facility] Follow up.   Specialty:  Home Health Services Why:  They will continue to do your home health care at your home Contact information: [address] [place] [address] [phone]        Nolene Ebbs, MD. Schedule an appointment as soon as possible for a visit in 3 day(s).   Specialty:  Internal Medicine Contact information: [address] [place] [address] [phone]           Major procedures and Radiology Reports - PLEASE review detailed and final reports thoroughly  -         Dg Chest Port 1 View  Result Date: 08/31/2016 CLINICAL DATA:  Altered mental status.  Fever and dyspnea. EXAM: PORTABLE CHEST 1 VIEW COMPARISON:  08/11/2016 FINDINGS: Unchanged moderate cardiomegaly. Mild vascular prominence, stable and likely chronic. Mild pleural thickening in the lateral and apical regions bilaterally, chronic. No confluent airspace consolidation. IMPRESSION: Stable cardiomegaly. Stable mild vascular prominence. No consolidation or large effusion. Electronically Signed   By: Andreas Newport M.D.   On: 08/31/2016 23:58   Portable Chest 1 View  Result Date: 08/11/2016 CLINICAL DATA:  Acute respiratory failure with hypoxia EXAM: PORTABLE CHEST 1 VIEW COMPARISON:  08/10/2016 FINDINGS: Cardiomegaly with vascular congestion. Low lung volumes. Small right pleural effusion again noted. No confluent opacities or edema. IMPRESSION: Cardiomegaly with vascular congestion.  Stable small right effusion. Electronically Signed  By: Rolm Baptise M.D.   On: 08/11/2016 07:18   Dg Chest Portable 1 View  Result Date: 08/10/2016 CLINICAL DATA:  Shortness of breath and cellulitis EXAM: PORTABLE CHEST 1 VIEW COMPARISON:  08/10/2016 FINDINGS: Cardiac shadow remains enlarged. The lungs are well aerated bilaterally. Some blunting of the right costophrenic angle consistent with small effusion  on the right is noted better visualize from the prior exam. No new focal infiltrate is seen. IMPRESSION: Small right effusion.  No other new focal abnormality is seen. Electronically Signed   By: Inez Catalina M.D.   On: 08/10/2016 19:22   Dg Chest Port 1 View  Result Date: 08/10/2016 CLINICAL DATA:  Tachycardia and lethargy.  Fever. EXAM: PORTABLE CHEST 1 VIEW COMPARISON:  July 13, 2016 FINDINGS: There is no edema or consolidation. Heart is enlarged with pulmonary vascularity within normal limits. No adenopathy. There is atherosclerotic calcification in the aorta. There is evidence of old trauma involving the left clavicle. IMPRESSION: Cardiomegaly. No edema or consolidation. There is aortic atherosclerosis. Electronically Signed   By: Lowella Grip III M.D.   On: 08/10/2016 12:33    Micro Results     Recent Results (from the past 240 hour(s))  Culture, blood (Routine x 2)     Status: None   Collection Time: 08/31/16 11:30 PM  Result Value Ref Range Status   Specimen Description BLOOD LEFT HAND  Final   Special Requests IN PEDIATRIC BOTTLE Blood Culture adequate volume  Final   Culture NO GROWTH 5 DAYS  Final   Report Status 09/06/2016 FINAL  Final  Culture, blood (Routine x 2)     Status: None   Collection Time: 08/31/16 11:35 PM  Result Value Ref Range Status   Specimen Description BLOOD RIGHT HAND  Final   Special Requests IN PEDIATRIC BOTTLE Blood Culture adequate volume  Final   Culture NO GROWTH 5 DAYS  Final   Report Status 09/06/2016 FINAL  Final  Urine culture     Status: None   Collection Time: 09/01/16  1:43 AM  Result Value Ref Range Status   Specimen Description URINE, RANDOM  Final   Special Requests NONE  Final   Culture NO GROWTH  Final   Report Status 09/02/2016 FINAL  Final    Today   Subjective    Elizabeth Pugh today has no headache,no chest abdominal pain,no new weakness tingling or numbness, feels much better wants to go home today.     Objective    Blood pressure 121/70, pulse 96, temperature 98.5 F (36.9 C), temperature source Oral, resp. rate 18, height 5\' 7"  (1.702 m), weight 127 kg (280 lb), SpO2 97 %.   Intake/Output Summary (Last 24 hours) at 09/07/16 0855 Last data filed at 09/07/16 0529  Gross per 24 hour  Intake                0 ml  Output             2050 ml  Net            -2050 ml    Exam Awake Alert, Oriented x 3, No new F.N deficits, Normal affect [place].AT,PERRAL Supple Neck,No JVD, No cervical lymphadenopathy appriciated.  Symmetrical Chest wall movement, Good air movement bilaterally, CTAB RRR,No Gallops,Rubs or new Murmurs, No Parasternal Heave +ve B.Sounds, Abd Soft, Non tender, No organomegaly appriciated, No rebound -guarding or rigidity. No Cyanosis, Clubbing , trace leg edema, No new Rash or bruise   Data Review  CBC w Diff:  Lab Results  Component Value Date   WBC 5.8 09/01/2016   HGB 10.9 (L) 09/01/2016   HCT 35.0 (L) 09/01/2016   PLT 154 09/01/2016   LYMPHOPCT 21 08/31/2016   MONOPCT 8 08/31/2016   EOSPCT 0 08/31/2016   BASOPCT 0 08/31/2016    CMP:  Lab Results  Component Value Date   NA 136 09/07/2016   NA 141 06/25/2016   K 3.8 09/07/2016   CL 86 (L) 09/07/2016   CO2 41 (H) 09/07/2016   BUN 32 (H) 09/07/2016   BUN 15 06/25/2016   CREATININE 1.35 (H) 09/07/2016   GLU 198 06/25/2016   PROT 6.1 (L) 09/02/2016   ALBUMIN 2.7 (L) 09/02/2016   BILITOT 0.9 09/02/2016   ALKPHOS 38 09/02/2016   AST 16 09/02/2016   ALT 10 (L) 09/02/2016  .   Total Time in preparing paper work, data evaluation and todays exam - 39 minutes  Lala Lund M.D on 09/07/2016 at 8:55 AM  [facility]   Office  [phone]

## 2016-09-07 NOTE — Progress Notes (Signed)
Discharged to home, family at bedside. D/c instructions and follow up appointments discussed with family verbalized understanding. PIV removed no s/s of infiltrations or swelling noted. Transported by [facility].

## 2016-09-07 NOTE — Progress Notes (Signed)
Nutrition Education Note  RD consulted for nutrition education regarding new onset CHF.  RD provided "Heart Failure Nutrition Therapy" handout from the Academy of Nutrition and Dietetics.th Also provided "Heart Healthy Nutrition Therapy"  As well as a handout specifically on fluid restriction, reviewed pt's daily fluid allowance. Reviewed patient's dietary recall. Provided examples on ways to decrease sodium intake in diet. Discouraged intake of processed foods and use of salt shaker. Encouraged fresh fruits and vegetables as well as whole grain sources of carbohydrates to maximize fiber intake.   RD discussed why it is important for patient to adhere to diet recommendations, and emphasized the role of fluids, foods to avoid, and importance of weighing self daily. Teach back method used.  Expect good compliance.  Body mass index is 43.85 kg/m. Pt meets criteria for morbid obesity based on current BMI.  Pt discharged to home.  Kerman Passey MS, RD, LDN [phone] Pager  [phone] Weekend/On-Call Pager

## 2016-09-09 ENCOUNTER — Other Ambulatory Visit: Payer: Self-pay | Admitting: Adult Health

## 2016-09-11 ENCOUNTER — Telehealth: Payer: Self-pay | Admitting: Adult Health

## 2016-09-11 NOTE — Telephone Encounter (Signed)
Pt needs an ov with RA. We can send a message to RA to see if a home doctor visit is possible if pt is not able to come to visit  lmom tcb x1 to daugther  Notes recorded by Melvenia Needles, NP on 08/12/2016 at 11:32 AM EDT No Sleep Apnea noted on sleep study  Pt is in hospital again  Wears BIPAP At bedtime  - ? Hypercarbia (PCO2 >80) ?[facility]  Needs ov with Dr. Elsworth Soho At discharge

## 2016-09-14 NOTE — Telephone Encounter (Signed)
Pt has been scheduled for OV with RA on 09/15/16 @ 2:00pm to review sleep study results.  Pt's daughter is aware. Nothing further needed.

## 2016-09-15 ENCOUNTER — Ambulatory Visit: Payer: 59 | Admitting: Pulmonary Disease

## 2016-09-17 ENCOUNTER — Encounter: Payer: Self-pay | Admitting: Acute Care

## 2016-09-17 ENCOUNTER — Ambulatory Visit (INDEPENDENT_AMBULATORY_CARE_PROVIDER_SITE_OTHER): Payer: Medicare Other | Admitting: Acute Care

## 2016-09-17 VITALS — BP 114/70 | HR 82

## 2016-09-17 DIAGNOSIS — J9601 Acute respiratory failure with hypoxia: Secondary | ICD-10-CM

## 2016-09-17 DIAGNOSIS — J9602 Acute respiratory failure with hypercapnia: Secondary | ICD-10-CM

## 2016-09-17 NOTE — Progress Notes (Signed)
History of Present Illness Elizabeth Pugh is a 83 y.o. female former smoker (quit in 1987, 40-pack-year smoking history) with chronic respiratory failure with hypoxia and hypercapnia, ( Requires home oxygen and BIPAP) decompensated D CHF, and suspected .OHS/OSA. She was  started on BIPAP after multiple recent admissions for hypercarbic respiratory failure. She has been seen by Tammy. NP, and Eric Form NP in the pulmonary office.  Pt. Has had 6 hospital admissions since 06/09/2016. She requires nightly BiPAP after multiple recent hospitalizations for hypercapnic respiratory failure.  09/17/2016 Follow Up OV: Patient presents to the office today with her daughter  to discuss   split-night sleep study done 08/05/2016. She is concerned because study did not confirm OSA, that the patient's BiPAP machine will not be covered by insurance. We discussed that her mother's hypercarbia was not an issue caused by OSA, however instead is caused by OHS. The patient is somnolent today in the office, nodding off at intervals.  Please see documentation on discharge note written by Dr. Joette Catching on 07/18/2016.  Acute respiratory failure with hypercapnia and hypoxia Appears to be primarily due to OHS w/ severe hypoventilation during sleeping - no evidence of true infection (afeb, normal WBC) - doing quite well now w/ QHS BIPAP - will be ready for d/c home as soon as home BIPAP can be confirmed - if sent home w/o BIPAP she will rapidly decompensate, and either die at home or require re-admission (i.e. She can NOT be sent home w/o BIPAP)  Split-night Sleep study 08/05/2016  IMPRESSIONS - No significant obstructive sleep apnea occurred during this  study (AHI = 0.4/h). - No significant central sleep apnea occurred during this study  (CAI = 0.0/h). - The patient had minimal or no oxygen desaturation during the  study on 2L [place] (Min O2 = 96.00%)  Patient has proven chronic elevated CO2 on both BMET, and  blood gas. She requires BiPAP for chronic hypercarbia due to OHS with severe hypoventilation during sleep. She is hypersomnolent during the day.   CBC Latest Ref Rng & Units 09/01/2016 08/31/2016 08/24/2016  WBC 4.0 - 10.5 K/uL 5.8 5.5 4.3  Hemoglobin 12.0 - 15.0 g/dL 10.9(L) 11.7(L) 10.4(L)  Hematocrit 36.0 - 46.0 % 35.0(L) 39.0 35.3(L)  Platelets 150 - 400 K/uL 154 184 160    BMP Latest Ref Rng & Units 09/07/2016 09/06/2016 09/05/2016  Glucose 65 - 99 mg/dL 242(H) 156(H) 259(H)  BUN 6 - 20 mg/dL 32(H) 25(H) 25(H)  Creatinine 0.44 - 1.00 mg/dL 1.35(H) 1.27(H) 1.26(H)  Sodium 135 - 145 mmol/L 136 138 136  Potassium 3.5 - 5.1 mmol/L 3.8 3.9 5.1  Chloride 101 - 111 mmol/L 86(L) 89(L) 90(L)  CO2 22 - 32 mmol/L 41(H) 41(H) 38(H)  Calcium 8.9 - 10.3 mg/dL 9.1 9.3 9.4    BNP    Component Value Date/Time   BNP 187.2 (H) 09/01/2016 0602    ProBNP    Component Value Date/Time   PROBNP 911.5 (H) 09/27/2011 1240    PFT    Component Value Date/Time   FEV1PRE 0.63 07/17/2016 1249   FVCPRE 0.86 07/17/2016 1249   PREFEV1FVCRT 74 07/17/2016 1249    Dg Chest Port 1 View  Result Date: 08/31/2016 CLINICAL DATA:  Altered mental status.  Fever and dyspnea. EXAM: PORTABLE CHEST 1 VIEW COMPARISON:  08/11/2016 FINDINGS: Unchanged moderate cardiomegaly. Mild vascular prominence, stable and likely chronic. Mild pleural thickening in the lateral and apical regions bilaterally, chronic. No confluent airspace consolidation. IMPRESSION: Stable cardiomegaly.  Stable mild vascular prominence. No consolidation or large effusion. Electronically Signed   By: Andreas Newport M.D.   On: 08/31/2016 23:58     Past medical hx Past Medical History:  Diagnosis Date  . Acute delirium 04/12/2011  . Acute kidney injury ([place]) 09/02/2015  . Anemia   . Anxiety   . Arthritis   . Atrial fibrillation ([place])   . Blood transfusion   . Bronchitis   . Cellulitis and abscess of left leg   . CHF (congestive heart failure)  ([place])   . Chronic respiratory failure with hypoxia and hypercapnia ([place]) 09/01/2016  . Diabetes mellitus   . GERD (gastroesophageal reflux disease)   . GI (gastrointestinal bleed)    Due to benign mass  . Hypertension   . Lobar pneumonia due to unspecified organism 09/02/2015  . On home oxygen therapy    "4L; 24/7" (12/11/2015)  . Restless leg syndrome   . Seizures ([place])   . Sepsis ([place]) 09/01/2016  . Stroke [facility])      Social History  Substance Use Topics  . Smoking status: Former Smoker    Packs/day: 1.00    Years: 40.00    Types: Cigarettes    Quit date: 09/21/1985  . Smokeless tobacco: Never Used  . Alcohol use No    Tobacco Cessation: Patient is a former 40-pack-year smoker. She quit in 1987.   Past surgical hx, Family hx, Social hx all reviewed.  Current Outpatient Prescriptions on File Prior to Visit  Medication Sig  . acetaminophen (TYLENOL) 325 MG tablet Take 325-650 mg by mouth every 6 (six) hours as needed (for pain).  Marland Kitchen albuterol (PROVENTIL HFA;VENTOLIN HFA) 108 (90 BASE) MCG/ACT inhaler Inhale 1 puff into the lungs every 6 (six) hours as needed for wheezing or shortness of breath.  Marland Kitchen albuterol (PROVENTIL) (2.5 MG/3ML) 0.083% nebulizer solution Take 2.5 mg by nebulization every 6 (six) hours as needed for wheezing or shortness of breath.  Marland Kitchen apixaban (ELIQUIS) 5 MG TABS tablet Take 1 tablet (5 mg total) by mouth 2 (two) times daily.  . calcium-vitamin D (CALCIUM 500+D) 500-400 MG-UNIT per tablet Take 1 tablet by mouth 2 (two) times daily.  Marland Kitchen dextromethorphan-guaiFENesin (MUCINEX DM) 30-600 MG 12hr tablet Take 1 tablet by mouth 2 (two) times daily as needed for cough.   . docusate sodium (COLACE) 100 MG capsule Take 100 mg by mouth daily as needed for mild constipation.  . ferrous sulfate 325 (65 FE) MG tablet Take 325 mg by mouth daily with supper.   . furosemide (LASIX) 80 MG tablet Take 1 tablet (80 mg total) by mouth 2 (two) times daily.  . hydrOXYzine  (ATARAX/VISTARIL) 25 MG tablet Take 25 mg by mouth every 8 (eight) hours as needed for itching.   . insulin glargine (LANTUS) 100 UNIT/ML injection Inject 0.4 mLs (40 Units total) into the skin at bedtime.  . insulin lispro (HUMALOG KWIKPEN) 100 UNIT/ML KiwkPen Inject 2-8 Units into the skin 3 (three) times daily before meals. PER SLIDING SCALE (nothing if BGL is 150 or less): 0-150 = 0 units, 151-200 = 2 units, 201-250 = 4 units, 251-300 = 6 units, 301-350 = 8 units, 351-400 = 10 units, 400+ = 12 units and notify NP/MD  . ipratropium-albuterol (DUONEB) 0.5-2.5 (3) MG/3ML SOLN Take 3 mLs by nebulization every 6 (six) hours as needed (for wheezing or shortness of breath).   . levETIRAcetam (KEPPRA) 500 MG tablet Take 1 tablet (500 mg total) by mouth 2 (two) times  daily.  . metoprolol tartrate (LOPRESSOR) 25 MG tablet Take 0.5 tablets (12.5 mg total) by mouth 2 (two) times daily.  Mable Fill (ALLERGY EYE OP) Place 1 drop into both eyes every 8 (eight) hours as needed (For eye irritation.).   Marland Kitchen pantoprazole (PROTONIX) 40 MG tablet Take 1 tablet (40 mg total) by mouth 2 (two) times daily before a meal. (Patient taking differently: Take 40 mg by mouth every morning. )  . polyethylene glycol (MIRALAX / GLYCOLAX) packet Take 17 g by mouth daily as needed for mild constipation.  . potassium chloride SA (K-DUR,KLOR-CON) 20 MEQ tablet Take 1 tablet (20 mEq total) by mouth daily.  . pregabalin (LYRICA) 75 MG capsule Take 75 mg by mouth at bedtime.   Marland Kitchen rOPINIRole (REQUIP) 2 MG tablet Take 1 tablet (2 mg total) by mouth 3 (three) times daily. (Patient taking differently: Take 4 mg by mouth 2 (two) times daily. )  . Vitamin D, Ergocalciferol, (DRISDOL) 50000 units CAPS capsule Take 50,000 Units by mouth every 7 (seven) days.   . VOLTAREN 1 % GEL Apply 1 application topically 4 (four) times daily as needed (For knee pain.).    No current facility-administered medications on file prior to visit.       Allergies  Allergen Reactions  . Penicillins Rash    Has patient had a PCN reaction causing immediate rash, facial/tongue/throat swelling, SOB or lightheadedness with hypotension: Yes Has patient had a PCN reaction causing severe rash involving mucus membranes or skin necrosis: No Has patient had a PCN reaction that required hospitalization: No Has patient had a PCN reaction occurring within the last 10 years: No If all of the above answers are "NO", then may proceed with Cephalosporin use. *Tolerates cephalosporins  . Erythromycin Itching and Other (See Comments)    Vaginal itching  . Zithromax [Azithromycin] Other (See Comments)    Caused a yeast infection     Review Of Systems:Done with assistance of daughter as patient is somnolent  Constitutional:   No  weight loss, night sweats,  Fevers, chills,+ fatigue, or+ lassitude.  HEENT:   No headaches,  Difficulty swallowing,  Tooth/dental problems, or  Sore throat,                No sneezing, itching, ear ache, nasal congestion, post nasal drip,   CV:  No chest pain,  Orthopnea, PND, swelling in lower extremities, anasarca, dizziness, palpitations, syncope.   GI  No heartburn, indigestion, abdominal pain, nausea, vomiting, diarrhea, change in bowel habits, loss of appetite, bloody stools.   Resp: + shortness of breath with exertion or at rest.  No excess mucus, no productive cough,  No non-productive cough,  No coughing up of blood.  No change in color of mucus.  No wheezing.  No chest wall deformity  Skin: no rash or lesions.  GU: no dysuria, change in color of urine, no urgency or frequency.  No flank pain, no hematuria   MS:  No joint pain or swelling.  No decreased range of motion.  No back pain.  Psych:  No change in mood or affect. No depression or anxiety.  No memory loss.   Vital Signs BP 114/70 (BP Location: Left Arm, Cuff Size: Normal)   Pulse 82   SpO2 100%    Physical Exam:  General- No distress,  A&Ox3,  morbidly obese female in a wheelchair  ENT: No sinus tenderness, TM clear, pale nasal mucosa, no oral exudate,no post nasal drip, no LAN  Cardiac: S1, S2, regular rate and rhythm, no murmur Chest: No wheeze/ rales/ dullness; no accessory muscle use, no nasal flaring, no sternal retractions, breath sounds diminished bilaterally  Abd.: Soft Non-tender, morbidly obese , bowel sounds positive,  Ext: No clubbing cyanosis,  1+ bilateral lower extremity edema Neuro:  Deconditioned and wheelchair , arousable call of name , follows simple commands  Skin: No rashes, warm and dry Psych: Somnolent with flat affect   Assessment/Plan  Acute respiratory failure with hypoxia and hypercapnia (HCC) Chronic hypercarbic, hypoxemic respiratory failure secondary to OHS, requiring BiPAP daily at bedtime Daily hypersomnolence Chronic elevation of CO2 on both serum labs and blood gases Plan Continue wearing BIPAP every night as you have been doing. ABG today or tomorrow to qualify for hypercarbia. Need is due to chronic hypercarbic respiratory failure.. We will change the diagnosis code for need and send to DME. Continue wearing oxygen during the day as you have been doing to maintain saturations doing 88-92% We will send copy of office note to [facility]. Follow up appointment in 2 months with Dr. Elsworth Soho. Please contact office for sooner follow up if symptoms do not improve or worsen or seek emergency care        Magdalen Spatz, NP 09/17/2016  10:26 PM

## 2016-09-17 NOTE — Assessment & Plan Note (Addendum)
Chronic hypercarbic, hypoxemic respiratory failure secondary to OHS, requiring BiPAP daily at bedtime Daily hypersomnolence Chronic elevation of CO2 on both serum labs and blood gases Plan Continue wearing BIPAP every night as you have been doing. ABG today or tomorrow to qualify for hypercarbia. Need is due to chronic hypercarbic respiratory failure.. We will change the diagnosis code for need and send to DME. Continue wearing oxygen during the day as you have been doing to maintain saturations doing 88-92% We will send copy of office note to [facility]. Follow up appointment in 2 months with Dr. Elsworth Soho. Please contact office for sooner follow up if symptoms do not improve or worsen or seek emergency care

## 2016-09-17 NOTE — Patient Instructions (Addendum)
Continue wearing BIPAP every night as you have been doing. ABG today or tomorrow to qualify for hypercarbia. Need is due to chronic hypercarbic respiratory failure.. We will change the diagnosis code for need and send to DME. We will send copy of office note to [facility]. Follow up appointment in 2 months with Dr. Elsworth Soho. Please contact office for sooner follow up if symptoms do not improve or worsen or seek emergency care

## 2016-09-18 ENCOUNTER — Inpatient Hospital Stay (HOSPITAL_COMMUNITY)
Admission: EM | Admit: 2016-09-18 | Discharge: 2016-09-21 | DRG: 689 | Disposition: A | Discharge status: Skilled Nursing Facility | Payer: Medicare Other | Attending: Internal Medicine | Admitting: Internal Medicine

## 2016-09-18 ENCOUNTER — Telehealth: Payer: Self-pay | Admitting: Acute Care

## 2016-09-18 ENCOUNTER — Inpatient Hospital Stay (HOSPITAL_COMMUNITY): Payer: Medicare Other

## 2016-09-18 ENCOUNTER — Emergency Department (HOSPITAL_COMMUNITY): Payer: Medicare Other

## 2016-09-18 ENCOUNTER — Encounter (HOSPITAL_COMMUNITY): Payer: Self-pay | Admitting: *Deleted

## 2016-09-18 ENCOUNTER — Inpatient Hospital Stay (HOSPITAL_COMMUNITY): Admission: RE | Admit: 2016-09-18 | Payer: Medicare Other | Source: Ambulatory Visit

## 2016-09-18 ENCOUNTER — Telehealth: Payer: Self-pay | Admitting: Pulmonary Disease

## 2016-09-18 DIAGNOSIS — G9340 Encephalopathy, unspecified: Secondary | ICD-10-CM | POA: Diagnosis present

## 2016-09-18 DIAGNOSIS — I482 Chronic atrial fibrillation, unspecified: Secondary | ICD-10-CM | POA: Diagnosis present

## 2016-09-18 DIAGNOSIS — J9611 Chronic respiratory failure with hypoxia: Secondary | ICD-10-CM | POA: Diagnosis present

## 2016-09-18 DIAGNOSIS — Z841 Family history of disorders of kidney and ureter: Secondary | ICD-10-CM

## 2016-09-18 DIAGNOSIS — Z794 Long term (current) use of insulin: Secondary | ICD-10-CM

## 2016-09-18 DIAGNOSIS — Z9119 Patient's noncompliance with other medical treatment and regimen: Secondary | ICD-10-CM

## 2016-09-18 DIAGNOSIS — B999 Unspecified infectious disease: Secondary | ICD-10-CM | POA: Diagnosis not present

## 2016-09-18 DIAGNOSIS — L0390 Cellulitis, unspecified: Secondary | ICD-10-CM

## 2016-09-18 DIAGNOSIS — Z7901 Long term (current) use of anticoagulants: Secondary | ICD-10-CM | POA: Diagnosis not present

## 2016-09-18 DIAGNOSIS — N179 Acute kidney failure, unspecified: Secondary | ICD-10-CM | POA: Diagnosis present

## 2016-09-18 DIAGNOSIS — I447 Left bundle-branch block, unspecified: Secondary | ICD-10-CM | POA: Diagnosis present

## 2016-09-18 DIAGNOSIS — L03116 Cellulitis of left lower limb: Secondary | ICD-10-CM

## 2016-09-18 DIAGNOSIS — I509 Heart failure, unspecified: Secondary | ICD-10-CM

## 2016-09-18 DIAGNOSIS — I110 Hypertensive heart disease with heart failure: Secondary | ICD-10-CM | POA: Diagnosis present

## 2016-09-18 DIAGNOSIS — Z880 Allergy status to penicillin: Secondary | ICD-10-CM | POA: Diagnosis not present

## 2016-09-18 DIAGNOSIS — G2581 Restless legs syndrome: Secondary | ICD-10-CM | POA: Diagnosis present

## 2016-09-18 DIAGNOSIS — Z8673 Personal history of transient ischemic attack (TIA), and cerebral infarction without residual deficits: Secondary | ICD-10-CM | POA: Diagnosis not present

## 2016-09-18 DIAGNOSIS — N390 Urinary tract infection, site not specified: Secondary | ICD-10-CM | POA: Diagnosis present

## 2016-09-18 DIAGNOSIS — G4733 Obstructive sleep apnea (adult) (pediatric): Secondary | ICD-10-CM | POA: Diagnosis present

## 2016-09-18 DIAGNOSIS — Z825 Family history of asthma and other chronic lower respiratory diseases: Secondary | ICD-10-CM

## 2016-09-18 DIAGNOSIS — Z6841 Body Mass Index (BMI) 40.0 and over, adult: Secondary | ICD-10-CM

## 2016-09-18 DIAGNOSIS — E860 Dehydration: Secondary | ICD-10-CM | POA: Diagnosis present

## 2016-09-18 DIAGNOSIS — R410 Disorientation, unspecified: Secondary | ICD-10-CM | POA: Diagnosis not present

## 2016-09-18 DIAGNOSIS — I5042 Chronic combined systolic (congestive) and diastolic (congestive) heart failure: Secondary | ICD-10-CM | POA: Diagnosis present

## 2016-09-18 DIAGNOSIS — Z7401 Bed confinement status: Secondary | ICD-10-CM

## 2016-09-18 DIAGNOSIS — Z87891 Personal history of nicotine dependence: Secondary | ICD-10-CM

## 2016-09-18 DIAGNOSIS — J9612 Chronic respiratory failure with hypercapnia: Secondary | ICD-10-CM | POA: Diagnosis present

## 2016-09-18 DIAGNOSIS — K219 Gastro-esophageal reflux disease without esophagitis: Secondary | ICD-10-CM | POA: Diagnosis present

## 2016-09-18 DIAGNOSIS — E119 Type 2 diabetes mellitus without complications: Secondary | ICD-10-CM | POA: Diagnosis present

## 2016-09-18 DIAGNOSIS — G92 Toxic encephalopathy: Secondary | ICD-10-CM | POA: Diagnosis present

## 2016-09-18 DIAGNOSIS — Z8249 Family history of ischemic heart disease and other diseases of the circulatory system: Secondary | ICD-10-CM

## 2016-09-18 LAB — URINALYSIS, ROUTINE W REFLEX MICROSCOPIC
Bilirubin Urine: NEGATIVE
Glucose, UA: NEGATIVE mg/dL
Ketones, ur: NEGATIVE mg/dL
Nitrite: NEGATIVE
Protein, ur: NEGATIVE mg/dL
Specific Gravity, Urine: 1.01 (ref 1.005–1.030)
pH: 5 (ref 5.0–8.0)

## 2016-09-18 LAB — COMPREHENSIVE METABOLIC PANEL WITH GFR
ALT: 12 U/L — ABNORMAL LOW (ref 14–54)
AST: 21 U/L (ref 15–41)
Albumin: 3.1 g/dL — ABNORMAL LOW (ref 3.5–5.0)
Alkaline Phosphatase: 48 U/L (ref 38–126)
Anion gap: 8 (ref 5–15)
BUN: 32 mg/dL — ABNORMAL HIGH (ref 6–20)
CO2: 33 mmol/L — ABNORMAL HIGH (ref 22–32)
Calcium: 9.1 mg/dL (ref 8.9–10.3)
Chloride: 101 mmol/L (ref 101–111)
Creatinine, Ser: 1.6 mg/dL — ABNORMAL HIGH (ref 0.44–1.00)
GFR calc Af Amer: 32 mL/min — ABNORMAL LOW
GFR calc non Af Amer: 28 mL/min — ABNORMAL LOW
Glucose, Bld: 222 mg/dL — ABNORMAL HIGH (ref 65–99)
Potassium: 4.4 mmol/L (ref 3.5–5.1)
Sodium: 142 mmol/L (ref 135–145)
Total Bilirubin: 1 mg/dL (ref 0.3–1.2)
Total Protein: 6.3 g/dL — ABNORMAL LOW (ref 6.5–8.1)

## 2016-09-18 LAB — I-STAT ARTERIAL BLOOD GAS, ED
Acid-Base Excess: 10 mmol/L — ABNORMAL HIGH (ref 0.0–2.0)
Bicarbonate: 36.7 mmol/L — ABNORMAL HIGH (ref 20.0–28.0)
O2 SAT: 98 %
PCO2 ART: 57.4 mmHg — AB (ref 32.0–48.0)
PO2 ART: 112 mmHg — AB (ref 83.0–108.0)
TCO2: 38 mmol/L (ref 0–100)
pH, Arterial: 7.415 (ref 7.350–7.450)

## 2016-09-18 LAB — CBC WITH DIFFERENTIAL/PLATELET
Basophils Absolute: 0 K/uL (ref 0.0–0.1)
Basophils Relative: 0 %
Eosinophils Absolute: 0 K/uL (ref 0.0–0.7)
Eosinophils Relative: 0 %
HCT: 33.8 % — ABNORMAL LOW (ref 36.0–46.0)
Hemoglobin: 10 g/dL — ABNORMAL LOW (ref 12.0–15.0)
Lymphocytes Relative: 13 %
Lymphs Abs: 0.9 K/uL (ref 0.7–4.0)
MCH: 28.5 pg (ref 26.0–34.0)
MCHC: 29.6 g/dL — ABNORMAL LOW (ref 30.0–36.0)
MCV: 96.3 fL (ref 78.0–100.0)
Monocytes Absolute: 0.1 K/uL (ref 0.1–1.0)
Monocytes Relative: 2 %
Neutro Abs: 5.8 K/uL (ref 1.7–7.7)
Neutrophils Relative %: 85 %
Platelets: 137 K/uL — ABNORMAL LOW (ref 150–400)
RBC: 3.51 MIL/uL — ABNORMAL LOW (ref 3.87–5.11)
RDW: 15.1 % (ref 11.5–15.5)
WBC: 6.8 K/uL (ref 4.0–10.5)

## 2016-09-18 LAB — AMMONIA: AMMONIA: 30 umol/L (ref 9–35)

## 2016-09-18 LAB — GLUCOSE, CAPILLARY
GLUCOSE-CAPILLARY: 164 mg/dL — AB (ref 65–99)
GLUCOSE-CAPILLARY: 261 mg/dL — AB (ref 65–99)

## 2016-09-18 LAB — LACTIC ACID, PLASMA
LACTIC ACID, VENOUS: 2 mmol/L — AB (ref 0.5–1.9)
Lactic Acid, Venous: 1.9 mmol/L (ref 0.5–1.9)

## 2016-09-18 LAB — I-STAT CG4 LACTIC ACID, ED: Lactic Acid, Venous: 1.72 mmol/L (ref 0.5–1.9)

## 2016-09-18 MED ORDER — ACETAMINOPHEN 325 MG PO TABS
650.0000 mg | ORAL_TABLET | Freq: Four times a day (QID) | ORAL | Status: DC | PRN
Start: 2016-09-18 — End: 2016-09-21

## 2016-09-18 MED ORDER — ACETAMINOPHEN 500 MG PO TABS
1000.0000 mg | ORAL_TABLET | Freq: Once | ORAL | Status: AC
  Administered 2016-09-18: 1000 mg via ORAL
  Filled 2016-09-18: qty 2

## 2016-09-18 MED ORDER — DOCUSATE SODIUM 100 MG PO CAPS: 100.0000 mg | ORAL_CAPSULE | Freq: Every day | ORAL | Status: DC | PRN

## 2016-09-18 MED ORDER — INSULIN GLARGINE 100 UNIT/ML ~~LOC~~ SOLN
25.0000 [IU] | Freq: Every day | SUBCUTANEOUS | Status: DC
  Administered 2016-09-18 – 2016-09-20 (×3): 25 [IU] via SUBCUTANEOUS
  Filled 2016-09-18 (×3): qty 0.25

## 2016-09-18 MED ORDER — VITAMIN D (ERGOCALCIFEROL) 1.25 MG (50000 UNIT) PO CAPS
50000.0000 [IU] | ORAL_CAPSULE | ORAL | Status: DC
  Administered 2016-09-19: 50000 [IU] via ORAL
  Filled 2016-09-18: qty 1

## 2016-09-18 MED ORDER — DM-GUAIFENESIN ER 30-600 MG PO TB12: 1.0000 | ORAL_TABLET | Freq: Two times a day (BID) | ORAL | Status: DC | PRN

## 2016-09-18 MED ORDER — ACETAMINOPHEN 325 MG PO TABS: 325.0000 mg | ORAL_TABLET | Freq: Four times a day (QID) | ORAL | Status: DC | PRN

## 2016-09-18 MED ORDER — DEXTROSE 5 % IV SOLN
2.0000 g | Freq: Once | INTRAVENOUS | Status: AC
  Administered 2016-09-18: 2 g via INTRAVENOUS
  Filled 2016-09-18: qty 2

## 2016-09-18 MED ORDER — ALBUTEROL SULFATE (2.5 MG/3ML) 0.083% IN NEBU: 2.5000 mg | INHALATION_SOLUTION | Freq: Four times a day (QID) | RESPIRATORY_TRACT | Status: DC | PRN

## 2016-09-18 MED ORDER — POLYETHYLENE GLYCOL 3350 17 G PO PACK: 17.0000 g | PACK | Freq: Every day | ORAL | Status: DC | PRN

## 2016-09-18 MED ORDER — FERROUS SULFATE 325 (65 FE) MG PO TABS
325.0000 mg | ORAL_TABLET | Freq: Every day | ORAL | Status: DC
  Administered 2016-09-18 – 2016-09-21 (×4): 325 mg via ORAL
  Filled 2016-09-18 (×4): qty 1

## 2016-09-18 MED ORDER — HYDROXYZINE HCL 25 MG PO TABS: 25.0000 mg | ORAL_TABLET | Freq: Three times a day (TID) | ORAL | Status: DC | PRN

## 2016-09-18 MED ORDER — INSULIN ASPART 100 UNIT/ML ~~LOC~~ SOLN
0.0000 [IU] | Freq: Three times a day (TID) | SUBCUTANEOUS | Status: DC
  Administered 2016-09-18: 2 [IU] via SUBCUTANEOUS
  Administered 2016-09-19: 3 [IU] via SUBCUTANEOUS
  Administered 2016-09-19: 5 [IU] via SUBCUTANEOUS
  Administered 2016-09-19: 2 [IU] via SUBCUTANEOUS
  Administered 2016-09-20: 5 [IU] via SUBCUTANEOUS
  Administered 2016-09-20: 7 [IU] via SUBCUTANEOUS
  Administered 2016-09-20 – 2016-09-21 (×2): 3 [IU] via SUBCUTANEOUS
  Administered 2016-09-21: 5 [IU] via SUBCUTANEOUS
  Administered 2016-09-21: 3 [IU] via SUBCUTANEOUS

## 2016-09-18 MED ORDER — ONDANSETRON HCL 4 MG PO TABS: 4.0000 mg | ORAL_TABLET | Freq: Four times a day (QID) | ORAL | Status: DC | PRN

## 2016-09-18 MED ORDER — DICLOFENAC SODIUM 1 % TD GEL
2.0000 g | Freq: Four times a day (QID) | TRANSDERMAL | Status: DC | PRN
  Filled 2016-09-18: qty 100

## 2016-09-18 MED ORDER — CALCIUM CARBONATE-VITAMIN D 500-200 MG-UNIT PO TABS
1.0000 | ORAL_TABLET | Freq: Two times a day (BID) | ORAL | Status: DC
  Administered 2016-09-18 – 2016-09-21 (×6): 1 via ORAL
  Filled 2016-09-18 (×6): qty 1

## 2016-09-18 MED ORDER — PANTOPRAZOLE SODIUM 40 MG PO TBEC
40.0000 mg | DELAYED_RELEASE_TABLET | Freq: Every morning | ORAL | Status: DC
  Administered 2016-09-19 – 2016-09-21 (×3): 40 mg via ORAL
  Filled 2016-09-18 (×3): qty 1

## 2016-09-18 MED ORDER — SODIUM CHLORIDE 0.9 % IV SOLN
INTRAVENOUS | Status: DC
  Administered 2016-09-18 – 2016-09-19 (×2): via INTRAVENOUS

## 2016-09-18 MED ORDER — POTASSIUM CHLORIDE CRYS ER 20 MEQ PO TBCR
20.0000 meq | EXTENDED_RELEASE_TABLET | Freq: Every day | ORAL | Status: DC
  Administered 2016-09-19: 20 meq via ORAL
  Filled 2016-09-18: qty 1

## 2016-09-18 MED ORDER — VANCOMYCIN HCL 10 G IV SOLR
2000.0000 mg | Freq: Once | INTRAVENOUS | Status: AC
  Administered 2016-09-18: 2000 mg via INTRAVENOUS
  Filled 2016-09-18: qty 2000

## 2016-09-18 MED ORDER — BISACODYL 10 MG RE SUPP: 10.0000 mg | Freq: Every day | RECTAL | Status: DC | PRN

## 2016-09-18 MED ORDER — VANCOMYCIN HCL 10 G IV SOLR: 1500.0000 mg | INTRAVENOUS | Status: DC

## 2016-09-18 MED ORDER — FUROSEMIDE 80 MG PO TABS
80.0000 mg | ORAL_TABLET | Freq: Two times a day (BID) | ORAL | Status: DC
  Administered 2016-09-19: 80 mg via ORAL
  Filled 2016-09-18: qty 1

## 2016-09-18 MED ORDER — ACETAMINOPHEN 650 MG RE SUPP: 650.0000 mg | Freq: Four times a day (QID) | RECTAL | Status: DC | PRN

## 2016-09-18 MED ORDER — ROPINIROLE HCL 1 MG PO TABS
4.0000 mg | ORAL_TABLET | Freq: Two times a day (BID) | ORAL | Status: DC
  Administered 2016-09-18 – 2016-09-21 (×6): 4 mg via ORAL
  Filled 2016-09-18 (×7): qty 4

## 2016-09-18 MED ORDER — ONDANSETRON HCL 4 MG/2ML IJ SOLN: 4.0000 mg | Freq: Four times a day (QID) | INTRAMUSCULAR | Status: DC | PRN

## 2016-09-18 MED ORDER — APIXABAN 5 MG PO TABS
5.0000 mg | ORAL_TABLET | Freq: Two times a day (BID) | ORAL | Status: DC
  Administered 2016-09-18 – 2016-09-21 (×6): 5 mg via ORAL
  Filled 2016-09-18 (×6): qty 1

## 2016-09-18 MED ORDER — METOPROLOL TARTRATE 12.5 MG HALF TABLET
12.5000 mg | ORAL_TABLET | Freq: Two times a day (BID) | ORAL | Status: DC
  Administered 2016-09-19 – 2016-09-21 (×5): 12.5 mg via ORAL
  Filled 2016-09-18 (×5): qty 1

## 2016-09-18 MED ORDER — DEXTROSE 5 % IV SOLN
1.0000 g | INTRAVENOUS | Status: AC
  Administered 2016-09-19 – 2016-09-21 (×3): 1 g via INTRAVENOUS
  Filled 2016-09-18 (×3): qty 1

## 2016-09-18 MED ORDER — IPRATROPIUM-ALBUTEROL 0.5-2.5 (3) MG/3ML IN SOLN: 3.0000 mL | Freq: Four times a day (QID) | RESPIRATORY_TRACT | Status: DC | PRN

## 2016-09-18 MED ORDER — PREGABALIN 75 MG PO CAPS
75.0000 mg | ORAL_CAPSULE | Freq: Every day | ORAL | Status: DC
  Administered 2016-09-18 – 2016-09-20 (×3): 75 mg via ORAL
  Filled 2016-09-18 (×3): qty 1

## 2016-09-18 MED ORDER — SENNOSIDES-DOCUSATE SODIUM 8.6-50 MG PO TABS: 1.0000 | ORAL_TABLET | Freq: Every evening | ORAL | Status: DC | PRN

## 2016-09-18 NOTE — ED Triage Notes (Signed)
To ED via [facility] for eval of ams per pt's family. Pt answering all orientation questions appropriately. Pt lives with family. Nonambulatory. Hot to touch. Left leg very red and hot to touch. Pulses palpable.

## 2016-09-18 NOTE — Progress Notes (Signed)
Pharmacy Antibiotic Note  Elizabeth Pugh is a 83 y.o. female admitted on 09/18/2016 with pneumonia and sepsis.  Pharmacy has been consulted for vancomycin and cefepime dosing. Tmax is 101.5 and lactic acid is <2. SCr is elevated above baseline at 1.6.   Plan: Vancomycin 2gm IV x 1 then 1500mg  IV Q48H Cefepime 2gm IV x 1 then 1gm IV Q24H F/u renal fxn, C&S, clinical status and trough at SS    Temp (24hrs), Avg:101.5 F (38.6 C), Min:101.5 F (38.6 C), Max:101.5 F (38.6 C)  No results for input(s): WBC, CREATININE, LATICACIDVEN, VANCOTROUGH, VANCOPEAK, VANCORANDOM, GENTTROUGH, GENTPEAK, GENTRANDOM, TOBRATROUGH, TOBRAPEAK, TOBRARND, AMIKACINPEAK, AMIKACINTROU, AMIKACIN in the last 168 hours.  Estimated Creatinine Clearance: 39.9 mL/min (A) (by C-G formula based on SCr of 1.35 mg/dL (H)).    Allergies  Allergen Reactions  . Penicillins Rash    Has patient had a PCN reaction causing immediate rash, facial/tongue/throat swelling, SOB or lightheadedness with hypotension: Yes Has patient had a PCN reaction causing severe rash involving mucus membranes or skin necrosis: No Has patient had a PCN reaction that required hospitalization: No Has patient had a PCN reaction occurring within the last 10 years: No If all of the above answers are "NO", then may proceed with Cephalosporin use. *Tolerates cephalosporins  . Erythromycin Itching and Other (See Comments)    Vaginal itching  . Zithromax [Azithromycin] Other (See Comments)    Caused a yeast infection     Antimicrobials this admission: Vanc 5/18>> Cefepime 5/18>>  Dose adjustments this admission: N/A  Microbiology results: Pending  Thank you for allowing pharmacy to be a part of this patient's care.  Santos Sollenberger, Rande Lawman 09/18/2016 10:29 AM

## 2016-09-18 NOTE — ED Notes (Signed)
Elizabeth Pugh from lab called to notify that CBC tube has clotted-- will need to be redrawn.

## 2016-09-18 NOTE — Telephone Encounter (Signed)
I have called and lmom for pts daughter to let her know that they have already done the labs that had been ordered by SG.  Nothing further is needed.

## 2016-09-18 NOTE — H&P (Signed)
History and Physical    Elizabeth Pugh HEN:[id number] DOB: 08-06-1926 DOA: 09/18/2016   PCP: Nolene Ebbs, MD   Patient coming from:  Home    Chief Complaint: Confusion   HPI: Elizabeth Pugh is a 83 y.o. female with medical history significant for morbid obesity, history of CVA  Now bedbound, chronic syst and diast ChF EF 45%, COPD on  4l [place] 45 % and Bipap at night, CAF on Eliquis, GERD, HTN, depression, anxiety, h/o seizures, recent admission for LLE cellulitis from 4/22-4/27 d/c on Doxy, amd most recently admitted from 5/1-5/7 for sepsis, presenting today with acute confusional state since this morning. She was in her usual state of health until rthen, becoming increasingly confused. Daughter is the history provider and denies any new changes in her meds, food poisoning, sick contacts. History of seizures. She has chronic shortness of breath but no worsening symptoms. Of note, she was seen by cards pulmonary physician yesterday, 4 chronic HyperCard awake hypoxic respiratory failure due OHS, who recommended continuing wearing BiPAP every night. Daughter denies any nausea or vomiting, or diarrhea. The patient makes urine, and daughter denies any all door in it. But reports that since this morning, her left lower extremity has become more 87 adults, similar to the symptoms when she presented with cellulitis. Daughter denies any worsening swelling in both lower extremities.  ED Course:  BP 132/69   Pulse (!) 101   Temp (!) 101.5 F (38.6 C) (Rectal)   Resp 18   SpO2 97% Comment: 2L [place]   Received Maxipime and Vancomycin in the ED  white count 6.9 hemoglobin 10 platelets 137 by carb 33 BUN 32 creatinine 1.6 glucose 222 lactic acid 1.72 temperature max 101.5 Receiving IVF at the ED  chest x-ray cardiomegaly with vascular congestion Review of Systems:  As per HPI otherwise all other systems reviewed and are negative  Past Medical History:  Diagnosis Date  . Acute delirium 04/12/2011  .  Acute kidney injury ([place]) 09/02/2015  . Anemia   . Anxiety   . Arthritis   . Atrial fibrillation ([place])   . Blood transfusion   . Bronchitis   . Cellulitis and abscess of left leg   . CHF (congestive heart failure) ([place])   . Chronic respiratory failure with hypoxia and hypercapnia ([place]) 09/01/2016  . Diabetes mellitus   . GERD (gastroesophageal reflux disease)   . GI (gastrointestinal bleed)    Due to benign mass  . Hypertension   . Lobar pneumonia due to unspecified organism 09/02/2015  . On home oxygen therapy    "4L; 24/7" (12/11/2015)  . Restless leg syndrome   . Seizures ([place])   . Sepsis ([place]) 09/01/2016  . Stroke [facility])     Past Surgical History:  Procedure Laterality Date  . CATARACT EXTRACTION W/ INTRAOCULAR LENS IMPLANT     "Dr. Gershon Crane; think they did the right one"  . EUS  09/24/2011   Procedure: UPPER ENDOSCOPIC ULTRASOUND (EUS) LINEAR;  Surgeon: Beryle Beams, MD;  Location: [facility] ENDOSCOPY;  Service: Endoscopy;  Laterality: N/A;  . TONSILLECTOMY  1935  . TUBAL LIGATION      Social History Social History   Social History  . Marital status: Widowed    Spouse name: N/A  . Number of children: N/A  . Years of education: N/A   Occupational History  . Not on file.   Social History Main Topics  . Smoking status: Former Smoker    Packs/day: 1.00  Years: 40.00    Types: Cigarettes    Quit date: 09/21/1985  . Smokeless tobacco: Never Used  . Alcohol use No  . Drug use: No  . Sexual activity: No   Other Topics Concern  . Not on file   Social History Narrative  . No narrative on file     Allergies  Allergen Reactions  . Penicillins Rash    Has patient had a PCN reaction causing immediate rash, facial/tongue/throat swelling, SOB or lightheadedness with hypotension: Yes Has patient had a PCN reaction causing severe rash involving mucus membranes or skin necrosis: No Has patient had a PCN reaction that required hospitalization: No Has patient had a PCN  reaction occurring within the last 10 years: No If all of the above answers are "NO", then may proceed with Cephalosporin use. *Tolerates cephalosporins  . Erythromycin Itching and Other (See Comments)    Vaginal itching  . Zithromax [Azithromycin] Other (See Comments)    Caused a yeast infection     Family History  Problem Relation Age of Onset  . Aneurysm Mother   . Heart attack Father   . Heart failure Sister   . Asthma Sister   . Kidney failure Sister   . Stroke Sister       Prior to Admission medications   Medication Sig Start Date End Date Taking? Authorizing Provider  acetaminophen (TYLENOL) 325 MG tablet Take 325-650 mg by mouth every 6 (six) hours as needed (for pain).   Yes [provider]  albuterol (PROVENTIL HFA;VENTOLIN HFA) 108 (90 BASE) MCG/ACT inhaler Inhale 1 puff into the lungs every 6 (six) hours as needed for wheezing or shortness of breath.   Yes [provider]  albuterol (PROVENTIL) (2.5 MG/3ML) 0.083% nebulizer solution Take 2.5 mg by nebulization every 6 (six) hours as needed for wheezing or shortness of breath.   Yes [provider]  apixaban (ELIQUIS) 5 MG TABS tablet Take 1 tablet (5 mg total) by mouth 2 (two) times daily. 09/27/15  Yes Lavina Hamman, MD  calcium-vitamin D (CALCIUM 500+D) 500-400 MG-UNIT per tablet Take 1 tablet by mouth 2 (two) times daily.   Yes [provider]  dextromethorphan-guaiFENesin (MUCINEX DM) 30-600 MG 12hr tablet Take 1 tablet by mouth 2 (two) times daily as needed for cough.    Yes [provider]  docusate sodium (COLACE) 100 MG capsule Take 100 mg by mouth daily as needed for mild constipation.   Yes [provider]  ferrous sulfate 325 (65 FE) MG tablet Take 325 mg by mouth daily with supper.    Yes [provider]  furosemide (LASIX) 80 MG tablet Take 1 tablet (80 mg total) by mouth 2 (two) times daily. Patient taking differently: Take 80 mg by mouth daily.   09/07/16  Yes Thurnell Lose, MD  hydrOXYzine (ATARAX/VISTARIL) 25 MG tablet Take 25 mg by mouth every 8 (eight) hours as needed for itching.  05/10/16  Yes [provider]  insulin glargine (LANTUS) 100 UNIT/ML injection Inject 0.4 mLs (40 Units total) into the skin at bedtime. Patient taking differently: Inject 50 Units into the skin at bedtime.  08/27/16  Yes Hosie Poisson, MD  insulin lispro (HUMALOG KWIKPEN) 100 UNIT/ML KiwkPen Inject 2-8 Units into the skin 3 (three) times daily before meals. PER SLIDING SCALE (nothing if BGL is 150 or less): 0-150 = 0 units, 151-200 = 2 units, 201-250 = 4 units, 251-300 = 6 units, 301-350 = 8 units, 351-400 =  10 units, 400+ = 12 units and notify NP/MD   Yes [provider]  ipratropium-albuterol (DUONEB) 0.5-2.5 (3) MG/3ML SOLN Take 3 mLs by nebulization every 6 (six) hours as needed (for wheezing or shortness of breath).    Yes [provider]  levETIRAcetam (KEPPRA) 500 MG tablet Take 1 tablet (500 mg total) by mouth 2 (two) times daily. 02/27/12  Yes Oti, Brantley Stage, MD  metoprolol tartrate (LOPRESSOR) 25 MG tablet Take 0.5 tablets (12.5 mg total) by mouth 2 (two) times daily. 06/23/16  Yes Theodis Blaze, MD  Naphazoline-Pheniramine (ALLERGY EYE OP) Place 1 drop into both eyes every 8 (eight) hours as needed (For eye irritation.).    Yes [provider]  pantoprazole (PROTONIX) 40 MG tablet Take 1 tablet (40 mg total) by mouth 2 (two) times daily before a meal. Patient taking differently: Take 40 mg by mouth every morning.  10/01/15  Yes Lavina Hamman, MD  polyethylene glycol [facility] Floria Raveling) packet Take 17 g by mouth daily as needed for mild constipation. 06/11/16  Yes Mariel Aloe, MD  potassium chloride SA (K-DUR,KLOR-CON) 20 MEQ tablet Take 1 tablet (20 mEq total) by mouth daily. 07/21/16  Yes Verlee Monte, MD  pregabalin (LYRICA) 75 MG capsule Take 75 mg by mouth at bedtime.    Yes [provider]  rOPINIRole  (REQUIP) 2 MG tablet Take 1 tablet (2 mg total) by mouth 3 (three) times daily. Patient taking differently: Take 4 mg by mouth 2 (two) times daily.  09/27/15  Yes Lavina Hamman, MD  Vitamin D, Ergocalciferol, (DRISDOL) 50000 units CAPS capsule Take 50,000 Units by mouth every 7 (seven) days.    Yes [provider]  VOLTAREN 1 % GEL Apply 1 application topically 4 (four) times daily as needed (For knee pain.).  10/26/11  Yes [provider]    Physical Exam:  Vitals:   09/18/16 1009 09/18/16 1023 09/18/16 1121  BP: 132/69    Pulse: (!) 101    Resp: 18    Temp:  (!) 101.5 F (38.6 C)   TempSrc:  Rectal   SpO2: 90%  97%   Constitutional: NAD, calm,ill appearing, lethargic, appears confused. Eyes: PERRL, lids and conjunctivae normal ENMT: Mucous membranes are moist, without exudate or lesions  Neck: normal, supple, no masses, no thyromegaly Respiratory:  decrease breath sounds at the bases, left greater than right, no wheezing or rhonchi. Exam is limited due to body habitus. Cardiovascular: Regular rate and rhythm, no murmurs, rubs or gallops. 1+ extremity edema. 2+ pedal pulses. No carotid bruits.  Abdomen: morbidly obese, no apartment tenderness, Soft, non tender, No hepatosplenomegaly. Bowel sounds positive.  Musculoskeletal: no clubbing / cyanosis, LLE erythema up to L thigh, no open lesions noted.  Neurologic: Sensation sappearsintact  Strength unable to test as patient cannot follow commands at this time  Psychiatric:  Appears confused and lethargic, no agitation noted .       Labs on Admission: I have personally reviewed following labs and imaging studies  CBC:  Recent Labs Lab 09/18/16 1215  WBC 6.8  NEUTROABS 5.8  HGB 10.0*  HCT 33.8*  MCV 96.3  PLT 137*    Basic Metabolic Panel:  Recent Labs Lab 09/18/16 1109  NA 142  K 4.4  CL 101  CO2 33*  GLUCOSE 222*  BUN 32*  CREATININE 1.60*  CALCIUM 9.1    GFR: Estimated Creatinine  Clearance: 33.7 mL/min (A) (by C-G formula based on SCr  of 1.6 mg/dL (H)).  Liver Function Tests:  Recent Labs Lab 09/18/16 1109  AST 21  ALT 12*  ALKPHOS 48  BILITOT 1.0  PROT 6.3*  ALBUMIN 3.1*   No results for input(s): LIPASE, AMYLASE in the last 168 hours. No results for input(s): AMMONIA in the last 168 hours.  Coagulation Profile: No results for input(s): INR, PROTIME in the last 168 hours.  Cardiac Enzymes: No results for input(s): CKTOTAL, CKMB, CKMBINDEX, TROPONINI in the last 168 hours.  BNP (last 3 results) No results for input(s): PROBNP in the last 8760 hours.  HbA1C: No results for input(s): HGBA1C in the last 72 hours.  CBG: No results for input(s): GLUCAP in the last 168 hours.  Lipid Profile: No results for input(s): CHOL, HDL, LDLCALC, TRIG, CHOLHDL, LDLDIRECT in the last 72 hours.  Thyroid Function Tests: No results for input(s): TSH, T4TOTAL, FREET4, T3FREE, THYROIDAB in the last 72 hours.  Anemia Panel: No results for input(s): VITAMINB12, FOLATE, FERRITIN, TIBC, IRON, RETICCTPCT in the last 72 hours.  Urine analysis:    Component Value Date/Time   COLORURINE YELLOW 09/18/2016 1023   APPEARANCEUR CLOUDY (A) 09/18/2016 1023   LABSPEC 1.010 09/18/2016 1023   PHURINE 5.0 09/18/2016 1023   GLUCOSEU NEGATIVE 09/18/2016 1023   HGBUR SMALL (A) 09/18/2016 1023   BILIRUBINUR NEGATIVE 09/18/2016 [place] 09/18/2016 1023   PROTEINUR NEGATIVE 09/18/2016 1023   UROBILINOGEN 1.0 01/19/2015 2241   NITRITE NEGATIVE 09/18/2016 1023   LEUKOCYTESUR LARGE (A) 09/18/2016 1023    Sepsis Labs: @LABRCNTIP (procalcitonin:4,lacticidven:4) )No results found for this or any previous visit (from the past 240 hour(s)).   Radiological Exams on Admission: Dg Chest Port 1 View  Result Date: 09/18/2016 CLINICAL DATA:  Sepsis EXAM: PORTABLE CHEST 1 VIEW COMPARISON:  08/31/2016 FINDINGS: Cardiomegaly evident with vascular congestion. Background COPD/  emphysema suspected. No superimposed edema, pneumonia, collapse or consolidation. No large effusion or pneumothorax. Trachea is midline. Aorta is atherosclerotic. Degenerative changes of the spine and shoulders. Bones are osteopenic. IMPRESSION: Cardiomegaly with vascular congestion No superimposed acute process. Electronically Signed   By: Jerilynn Mages.  Shick M.D.   On: 09/18/2016 11:25    EKG: Independently reviewed.  Assessment/Plan Active Problems:   * No active hospital problems. *   Acute encephalopathy. Etiology likely due to UTI, h/o cellulitis with recent admission in 08/2016  history of chronic hypoxemia, with hypercarbia T max 102, now 101 WBC normal,  Bicarb  33 . Lactate 1.72. UA + leuk .History of seizures, not on Keppra Admit to  Inpatient Ammonia level ABG  PT/OT Continue Vanc and Cefepime Blood cultures, Urine culture  Lactic acid Continue IVF at 75 cc/h X ray LLE r/o osto. Last doppler 08/2016 neg for DVT  CT head wo contrast in view of confusion and h/o CVA    Type II Diabetes Current blood sugar level is 222 Lab Results  Component Value Date   HGBA1C 8.7 (H) 08/23/2016  Lantus , SSI Heart healthy carb modified die when able to tolerate po   Hypertension BP 99/69   Pulse 86   Temp (!) 101.5 F (38.6 C) (Rectal)   Resp 19   SpO2 99%  Hold home anti-hypertensive medications as patient took them this morning and in view of soft BP today   Chronic combined systolic and diastolic CHF  CXR  With cardiomegaly and vascular congestion  Recent BNP 187 04/09/16 showed EF of 40-50 percent. CHF seems to be compensated. Lactic Acid normal  WBC normal   Weight 280   HAd lasix this morning at home, currently on hold due to lower BP and Cr at 1.6  monitor I/Os and daily weights prn 02  Chronic resp failure with hypoxia and hypercapnia  PAtien ton Bipap at home at  [place] due to AMS Repeat ABG  Prn nebs  History of CVA, PE, no acute issues Continue Eliquis   Atrial  Fibrillation CHA2DS2-VASc score 8, on anticoagulation with Eliquis  Continue Eliquis  Continue metoprolol when BP improves  Anxiety and Depression meds on hold since d/c due to possible polypharmacy   GERD, no acute symptoms Continue PPI    DVT prophylaxis:Eliquis Code Status:   Full     Family Communication:  Discussed with patient daughter Disposition Plan: Expect patient to be discharged to home after condition improves Consults called:  None   Admission status: IP telemetry    [facility], PA-C [facility]   09/18/2016, 12:53 PM

## 2016-09-18 NOTE — Progress Notes (Signed)
Pt refusing to wear BIPAP at this time. I told her to call if she changes her mind

## 2016-09-18 NOTE — Telephone Encounter (Signed)
Will forward message to RA to make him aware of pts admission to the hospital.

## 2016-09-18 NOTE — ED Provider Notes (Signed)
[facility] DEPT Provider Note   CSN: [id number] Arrival date & time: 09/18/16  1000     History   Chief Complaint Chief Complaint  Patient presents with  . Altered Mental Status    HPI Elizabeth Pugh is a 83 y.o. female. Patient presents for evaluation of fever and reports that family notes "unusual behavior".  HPI this is a morbidly obese 83 year old female who presents via ambulance from home with complaint of fever and concern by family of patient's behavior. His recurrent episodes of cellulitis of her leg. Patient is awake upon arrival. Able to answer questions. No complaint of headache or difficult breathing. Complains of pain in the left leg. No dysuria or frequency. No nausea or vomiting. No sputum production.  Past Medical History:  Diagnosis Date  . Acute delirium 04/12/2011  . Acute kidney injury ([place]) 09/02/2015  . Anemia   . Anxiety   . Arthritis   . Atrial fibrillation ([place])   . Blood transfusion   . Bronchitis   . Cellulitis and abscess of left leg   . CHF (congestive heart failure) ([place])   . Chronic respiratory failure with hypoxia and hypercapnia ([place]) 09/01/2016  . Diabetes mellitus   . GERD (gastroesophageal reflux disease)   . GI (gastrointestinal bleed)    Due to benign mass  . Hypertension   . Lobar pneumonia due to unspecified organism 09/02/2015  . On home oxygen therapy    "4L; 24/7" (12/11/2015)  . Restless leg syndrome   . Seizures ([place])   . Sepsis ([place]) 09/01/2016  . Stroke [facility])     Patient Active Problem List   Diagnosis Date Noted  . Confusion associated with infection 09/18/2016  . Cellulitis of left leg 08/23/2016  . Sepsis ([place]) 08/10/2016  . Morbid obesity with BMI of 45.0-49.9, adult ([place]) 08/10/2016  . Cellulitis of left lower extremity 08/10/2016  . Acute respiratory failure with hypercapnia ([place]) 07/13/2016  . Acute respiratory failure with hypoxia and hypercapnia ([place]) 06/16/2016  . Urinary tract infection without hematuria  06/16/2016  . HCAP (healthcare-associated pneumonia) 06/16/2016  . Acute respiratory failure ([place]) 06/16/2016  . CAP (community acquired pneumonia) 06/09/2016  . Cellulitis 12/16/2015  . Confusion 12/11/2015  . SIRS (systemic inflammatory response syndrome) ([place]) 12/11/2015  . Chronic respiratory failure with hypoxia and hypercapnia ([place]) 09/28/2015  . Acute encephalopathy   . Acute on chronic congestive heart failure ([place])   . Needs sleep apnea assessment   . Acute on chronic diastolic (congestive) heart failure ([place]) 09/22/2015  . Acute kidney injury ([place]) 09/02/2015  . Lobar pneumonia due to unspecified organism 09/02/2015  . Bilateral leg edema   . Acute on chronic respiratory failure with hypoxia and hypercapnia ([place]) 08/29/2015  . History of pulmonary embolism 01/27/2015  . Chest pain at rest 01/27/2015  . Shortness of breath at rest 01/27/2015  . Chronic combined systolic and diastolic heart failure ([place]) 01/27/2015  . Thrombocytopenia ([place]) 01/27/2015  . Abnormal findings on radiological examination of gastrointestinal tract 01/27/2015  . Chronic atrial fibrillation ([place])   . Seizure disorder ([place])   . Atrial fibrillation with rapid ventricular response ([place])   . Anxiety   . Anemia   . Restless leg syndrome   . Acute delirium 04/12/2011  . DM (diabetes mellitus), type 2, uncontrolled with complications ([place]) 10/93/2355  . CVA, old, hemiparesis ([place]) 03/25/2011  . Hypertension 03/25/2011    Past Surgical History:  Procedure Laterality Date  . CATARACT EXTRACTION W/ INTRAOCULAR LENS IMPLANT     "  Dr. Gershon Crane; think they did the right one"  . EUS  09/24/2011   Procedure: UPPER ENDOSCOPIC ULTRASOUND (EUS) LINEAR;  Surgeon: Beryle Beams, MD;  Location: [facility] ENDOSCOPY;  Service: Endoscopy;  Laterality: N/A;  . TONSILLECTOMY  1935  . TUBAL LIGATION      OB History    No data available       Home Medications    Prior to Admission medications   Medication Sig Start  Date End Date Taking? Authorizing Provider  acetaminophen (TYLENOL) 325 MG tablet Take 325-650 mg by mouth every 6 (six) hours as needed (for pain).   Yes [provider]  albuterol (PROVENTIL HFA;VENTOLIN HFA) 108 (90 BASE) MCG/ACT inhaler Inhale 1 puff into the lungs every 6 (six) hours as needed for wheezing or shortness of breath.   Yes [provider]  albuterol (PROVENTIL) (2.5 MG/3ML) 0.083% nebulizer solution Take 2.5 mg by nebulization every 6 (six) hours as needed for wheezing or shortness of breath.   Yes [provider]  apixaban (ELIQUIS) 5 MG TABS tablet Take 1 tablet (5 mg total) by mouth 2 (two) times daily. 09/27/15  Yes Lavina Hamman, MD  calcium-vitamin D (CALCIUM 500+D) 500-400 MG-UNIT per tablet Take 1 tablet by mouth 2 (two) times daily.   Yes [provider]  dextromethorphan-guaiFENesin (MUCINEX DM) 30-600 MG 12hr tablet Take 1 tablet by mouth 2 (two) times daily as needed for cough.    Yes [provider]  docusate sodium (COLACE) 100 MG capsule Take 100 mg by mouth daily as needed for mild constipation.   Yes [provider]  ferrous sulfate 325 (65 FE) MG tablet Take 325 mg by mouth daily with supper.    Yes [provider]  furosemide (LASIX) 80 MG tablet Take 1 tablet (80 mg total) by mouth 2 (two) times daily. Patient taking differently: Take 80 mg by mouth daily.  09/07/16  Yes Thurnell Lose, MD  hydrOXYzine (ATARAX/VISTARIL) 25 MG tablet Take 25 mg by mouth every 8 (eight) hours as needed for itching.  05/10/16  Yes [provider]  insulin glargine (LANTUS) 100 UNIT/ML injection Inject 0.4 mLs (40 Units total) into the skin at bedtime. Patient taking differently: Inject 50 Units into the skin at bedtime.  08/27/16  Yes Hosie Poisson, MD  insulin lispro (HUMALOG KWIKPEN) 100 UNIT/ML KiwkPen Inject 2-8 Units into the skin 3 (three) times daily before meals. PER SLIDING SCALE (nothing if BGL is 150 or  less): 0-150 = 0 units, 151-200 = 2 units, 201-250 = 4 units, 251-300 = 6 units, 301-350 = 8 units, 351-400 = 10 units, 400+ = 12 units and notify NP/MD   Yes [provider]  ipratropium-albuterol (DUONEB) 0.5-2.5 (3) MG/3ML SOLN Take 3 mLs by nebulization every 6 (six) hours as needed (for wheezing or shortness of breath).    Yes [provider]  levETIRAcetam (KEPPRA) 500 MG tablet Take 1 tablet (500 mg total) by mouth 2 (two) times daily. 02/27/12  Yes Oti, Brantley Stage, MD  metoprolol tartrate (LOPRESSOR) 25 MG tablet Take 0.5 tablets (12.5 mg total) by mouth 2 (two) times daily. 06/23/16  Yes Theodis Blaze, MD  Naphazoline-Pheniramine (ALLERGY EYE OP) Place 1 drop into both eyes every 8 (eight) hours as needed (For eye irritation.).    Yes [provider]  pantoprazole (PROTONIX) 40 MG tablet Take 1 tablet (40 mg total) by mouth 2 (two) times daily before a meal. Patient taking differently: Take  40 mg by mouth every morning.  10/01/15  Yes Lavina Hamman, MD  polyethylene glycol [facility] Floria Raveling) packet Take 17 g by mouth daily as needed for mild constipation. 06/11/16  Yes Mariel Aloe, MD  potassium chloride SA (K-DUR,KLOR-CON) 20 MEQ tablet Take 1 tablet (20 mEq total) by mouth daily. 07/21/16  Yes Verlee Monte, MD  pregabalin (LYRICA) 75 MG capsule Take 75 mg by mouth at bedtime.    Yes [provider]  rOPINIRole (REQUIP) 2 MG tablet Take 1 tablet (2 mg total) by mouth 3 (three) times daily. Patient taking differently: Take 4 mg by mouth 2 (two) times daily.  09/27/15  Yes Lavina Hamman, MD  Vitamin D, Ergocalciferol, (DRISDOL) 50000 units CAPS capsule Take 50,000 Units by mouth every 7 (seven) days.    Yes [provider]  VOLTAREN 1 % GEL Apply 1 application topically 4 (four) times daily as needed (For knee pain.).  10/26/11  Yes [provider]    Family History Family History  Problem Relation Age of Onset  . Aneurysm Mother   .  Heart attack Father   . Heart failure Sister   . Asthma Sister   . Kidney failure Sister   . Stroke Sister     Social History Social History  Substance Use Topics  . Smoking status: Former Smoker    Packs/day: 1.00    Years: 40.00    Types: Cigarettes    Quit date: 09/21/1985  . Smokeless tobacco: Never Used  . Alcohol use No     Allergies   Penicillins; Erythromycin; and Zithromax [azithromycin]   Review of Systems Review of Systems  Constitutional: Positive for fatigue and fever. Negative for appetite change, chills and diaphoresis.  HENT: Negative for mouth sores, sore throat and trouble swallowing.   Eyes: Negative for visual disturbance.  Respiratory: Negative for cough, chest tightness, shortness of breath and wheezing.   Cardiovascular: Negative for chest pain.  Gastrointestinal: Negative for abdominal distention, abdominal pain, diarrhea, nausea and vomiting.  Endocrine: Negative for polydipsia, polyphagia and polyuria.  Genitourinary: Negative for dysuria, frequency and hematuria.  Musculoskeletal: Negative for gait problem.  Skin: Positive for color change and rash. Negative for pallor.  Neurological: Negative for dizziness, syncope, light-headedness and headaches.  Hematological: Does not bruise/bleed easily.  Psychiatric/Behavioral: Negative for behavioral problems and confusion.     Physical Exam Updated Vital Signs BP 132/69   Pulse (!) 101   Temp (!) 101.5 F (38.6 C) (Rectal)   Resp 18   SpO2 97% Comment: 2L [place]  Physical Exam  Constitutional: She is oriented to person, place, and time. No distress.  Morbidly obese. Lying supine. Minimally able to move herself about in bed. But awake and alert. Able to answer simple questions  HENT:  Head: Normocephalic.  Eyes: Conjunctivae are normal. Pupils are equal, round, and reactive to light. No scleral icterus.  Neck: Normal range of motion. Neck supple. No thyromegaly present.  Cardiovascular: Normal  rate and regular rhythm.  Exam reveals no gallop and no friction rub.   No murmur heard. Pulmonary/Chest: Effort normal and breath sounds normal. No respiratory distress. She has no wheezes. She has no rales.  Abdominal: Soft. Bowel sounds are normal. She exhibits no distension. There is no tenderness. There is no rebound.  Musculoskeletal: Normal range of motion.  Marked erythema and warmth of the left leg to the mid thigh consistent with cellulitis.  Neurological: She is alert and oriented to person,  place, and time.  Skin: Skin is warm and dry. No rash noted.  Psychiatric: She has a normal mood and affect. Her behavior is normal.     ED Treatments / Results  Labs (all labs ordered are listed, but only abnormal results are displayed) Labs Reviewed  COMPREHENSIVE METABOLIC PANEL - Abnormal; Notable for the following:       Result Value   CO2 33 (*)    Glucose, Bld 222 (*)    BUN 32 (*)    Creatinine, Ser 1.60 (*)    Total Protein 6.3 (*)    Albumin 3.1 (*)    ALT 12 (*)    GFR calc non Af Amer 28 (*)    GFR calc Af Amer 32 (*)    All other components within normal limits  URINALYSIS, ROUTINE W REFLEX MICROSCOPIC - Abnormal; Notable for the following:    APPearance CLOUDY (*)    Hgb urine dipstick SMALL (*)    Leukocytes, UA LARGE (*)    Bacteria, UA FEW (*)    Squamous Epithelial / LPF 0-5 (*)    Non Squamous Epithelial 0-5 (*)    All other components within normal limits  CBC WITH DIFFERENTIAL/PLATELET - Abnormal; Notable for the following:    RBC 3.51 (*)    Hemoglobin 10.0 (*)    HCT 33.8 (*)    MCHC 29.6 (*)    Platelets 137 (*)    All other components within normal limits  CULTURE, BLOOD (ROUTINE X 2)  CULTURE, BLOOD (ROUTINE X 2)  CBC WITH DIFFERENTIAL/PLATELET  I-STAT CG4 LACTIC ACID, ED  I-STAT CG4 LACTIC ACID, ED    EKG  EKG Interpretation None       Radiology Dg Chest Port 1 View  Result Date: 09/18/2016 CLINICAL DATA:  Sepsis EXAM: PORTABLE  CHEST 1 VIEW COMPARISON:  08/31/2016 FINDINGS: Cardiomegaly evident with vascular congestion. Background COPD/ emphysema suspected. No superimposed edema, pneumonia, collapse or consolidation. No large effusion or pneumothorax. Trachea is midline. Aorta is atherosclerotic. Degenerative changes of the spine and shoulders. Bones are osteopenic. IMPRESSION: Cardiomegaly with vascular congestion No superimposed acute process. Electronically Signed   By: Jerilynn Mages.  Shick M.D.   On: 09/18/2016 11:25    Procedures Procedures (including critical care time)  Medications Ordered in ED Medications  ceFEPIme (MAXIPIME) 1 g in dextrose 5 % 50 mL IVPB (not administered)  vancomycin (VANCOCIN) 1,500 mg in sodium chloride 0.9 % 500 mL IVPB (not administered)  acetaminophen (TYLENOL) tablet 1,000 mg (1,000 mg Oral Given 09/18/16 1104)  ceFEPIme (MAXIPIME) 2 g in dextrose 5 % 50 mL IVPB (0 g Intravenous Stopped 09/18/16 1104)  vancomycin (VANCOCIN) 2,000 mg in sodium chloride 0.9 % 500 mL IVPB (2,000 mg Intravenous New Bag/Given 09/18/16 1044)     Initial Impression / Assessment and Plan / ED Course  I have reviewed the triage vital signs and the nursing notes.  Pertinent labs & imaging results that were available during my care of the patient were reviewed by me and considered in my medical decision making (see chart for details).    Normal lactate. Blood pressure remained stable. Tachycardia resolves. Blood and urine cultures obtained. Urine suggests infection. Her source seems to be obviously her left leg. Given broad-spectrum metabolic syndrome initially. Was not given fluids aspositive sepsis markers and vital signs stabilize. Given vancomycin and Maxipime. Discussed with hospitalist. Patient will be admitted for IV antiemetic treatment of her cellulitis.  Final Clinical Impressions(s) / ED Diagnoses  Final diagnoses:  Cellulitis of left lower extremity    New Prescriptions New Prescriptions   No  medications on file     Tanna Furry, MD 09/18/16 1314

## 2016-09-19 ENCOUNTER — Inpatient Hospital Stay (HOSPITAL_COMMUNITY): Payer: Medicare Other

## 2016-09-19 DIAGNOSIS — G9340 Encephalopathy, unspecified: Secondary | ICD-10-CM

## 2016-09-19 LAB — CBC
HEMATOCRIT: 33 % — AB (ref 36.0–46.0)
HEMOGLOBIN: 9.8 g/dL — AB (ref 12.0–15.0)
MCH: 28.9 pg (ref 26.0–34.0)
MCHC: 29.7 g/dL — ABNORMAL LOW (ref 30.0–36.0)
MCV: 97.3 fL (ref 78.0–100.0)
Platelets: 129 10*3/uL — ABNORMAL LOW (ref 150–400)
RBC: 3.39 MIL/uL — AB (ref 3.87–5.11)
RDW: 15 % (ref 11.5–15.5)
WBC: 4.3 10*3/uL (ref 4.0–10.5)

## 2016-09-19 LAB — COMPREHENSIVE METABOLIC PANEL
ALT: 10 U/L — ABNORMAL LOW (ref 14–54)
AST: 15 U/L (ref 15–41)
Albumin: 2.7 g/dL — ABNORMAL LOW (ref 3.5–5.0)
Alkaline Phosphatase: 47 U/L (ref 38–126)
Anion gap: 8 (ref 5–15)
BUN: 32 mg/dL — ABNORMAL HIGH (ref 6–20)
CHLORIDE: 101 mmol/L (ref 101–111)
CO2: 32 mmol/L (ref 22–32)
Calcium: 8.6 mg/dL — ABNORMAL LOW (ref 8.9–10.3)
Creatinine, Ser: 1.4 mg/dL — ABNORMAL HIGH (ref 0.44–1.00)
GFR, EST AFRICAN AMERICAN: 38 mL/min — AB (ref >60)
GFR, EST NON AFRICAN AMERICAN: 32 mL/min — AB (ref >60)
Glucose, Bld: 226 mg/dL — ABNORMAL HIGH (ref 65–99)
POTASSIUM: 3.9 mmol/L (ref 3.5–5.1)
SODIUM: 141 mmol/L (ref 135–145)
Total Bilirubin: 0.8 mg/dL (ref 0.3–1.2)
Total Protein: 5.9 g/dL — ABNORMAL LOW (ref 6.5–8.1)

## 2016-09-19 LAB — URINE CULTURE: Culture: NO GROWTH

## 2016-09-19 LAB — PROTIME-INR
INR: 1.61
Prothrombin Time: 19.4 seconds — ABNORMAL HIGH (ref 11.4–15.2)

## 2016-09-19 LAB — GLUCOSE, CAPILLARY
GLUCOSE-CAPILLARY: 175 mg/dL — AB (ref 65–99)
GLUCOSE-CAPILLARY: 274 mg/dL — AB (ref 65–99)
Glucose-Capillary: 240 mg/dL — ABNORMAL HIGH (ref 65–99)

## 2016-09-19 MED ORDER — FUROSEMIDE 80 MG PO TABS: 80.0000 mg | ORAL_TABLET | Freq: Two times a day (BID) | ORAL | Status: DC

## 2016-09-19 MED ORDER — FUROSEMIDE 40 MG PO TABS: 60.0000 mg | ORAL_TABLET | Freq: Two times a day (BID) | ORAL | Status: DC

## 2016-09-19 NOTE — Progress Notes (Signed)
PT Cancellation Note  Patient Details Name: Elizabeth Pugh MRN: [id number] DOB: Sep 13, 1926   Cancelled Treatment:    Reason Eval/Treat Not Completed: Patient at procedure or test/unavailable. Pt is at a test. Will check back later as time allows.    Scheryl Marten PT, DPT  [phone]  09/19/2016, 11:33 AM

## 2016-09-19 NOTE — Progress Notes (Signed)
EEG Completed; Results Pending  

## 2016-09-19 NOTE — Evaluation (Signed)
Occupational Therapy Evaluation and Discharge  Patient Details Name: Elizabeth Pugh MRN: [id number] DOB: Nov 07, 1926 Today's Date: 09/19/2016    History of Present Illness Pt is an 83 yo female admitted through ED on 09/18/16 with confusion related to a possible UTI. Pt has had 7 admissions in the past 6 months with mild LLE cellulitis 4/21-4/27 and sepsis 5/1-5/7. Pt had an EEG performed which was normal. PMH significant for Obesity, CVA, Bed bound, CHF EF 45%, COPD on 2L via [place], GERD, HTN, Depression, anxiety and H/o siezures.    Clinical Impression   Pt reports she required total assist from family and caregivers for ADL at bed level. Currently pt total assist for ADL and required use of maximove for transfer to chair. Pt planning to d/c home with 24/7 assist from family and caregivers. No further acute OT needs identified; signing off at this time. Please re-consult if needs change. Thank you for this referral.    Follow Up Recommendations  No OT follow up;Supervision/Assistance - 24 hour    Equipment Recommendations  None recommended by OT    Recommendations for Other Services       Precautions / Restrictions Precautions Precautions: Fall Restrictions Weight Bearing Restrictions: No      Mobility Bed Mobility Overal bed mobility: Needs Assistance Bed Mobility: Supine to Sit;Rolling Rolling: Max assist;+2 for physical assistance   Supine to sit: Max assist;+2 for physical assistance     General bed mobility comments: Pt able to assist with rolling side to side by reaching onto railing, requires more assistance rolling to right with LLE. Max A x2 from supine to sit to EOB. Max A to maintain sitting at EOB  Transfers Overall transfer level: Needs assistance Equipment used: Ambulation equipment used             General transfer comment: Total A transfer via maximove    Balance Overall balance assessment: Needs assistance Sitting-balance support: Bilateral upper  extremity supported Sitting balance-Leahy Scale: Zero Sitting balance - Comments: Pt requires Mod to Max A to maintain sitting at EOB.  Postural control: Left lateral lean;Posterior lean                                 ADL either performed or assessed with clinical judgement   ADL Overall ADL's : Needs assistance/impaired                                       General ADL Comments: Pt total care for ADL at bed level and total assist for hoyer lift transfers.     Vision         Perception     Praxis      Pertinent Vitals/Pain Pain Assessment: Faces Faces Pain Scale: Hurts little more Pain Location: L LE with mobility Pain Descriptors / Indicators: Grimacing;Discomfort Pain Intervention(s): Monitored during session;Repositioned     Hand Dominance Right   Extremity/Trunk Assessment Upper Extremity Assessment Upper Extremity Assessment: Generalized weakness   Lower Extremity Assessment Lower Extremity Assessment: Defer to PT evaluation RLE Deficits / Details: Gross RLE strength 2/5 with edema noted posteriorly.  LLE Deficits / Details: Gross LLE strength 1+/5; redness and edema noted.        Communication Communication Communication: No difficulties   Cognition Arousal/Alertness: Awake/alert Behavior During Therapy: WFL for tasks assessed/performed Overall Cognitive  Status: Within Functional Limits for tasks assessed                                     General Comments       Exercises     Shoulder Instructions      Home Living Family/patient expects to be discharged to:: Private residence Living Arrangements: Children Available Help at Discharge: Family;Available 24 hours/day;Personal care attendant Type of Home: House Home Access: Ramped entrance     Home Layout: One level     Bathroom Shower/Tub: Other (comment) (sponge bath at bed level)   Bathroom Toilet: Handicapped height Bathroom Accessibility:  Yes   Home Equipment: Wheelchair - Teacher, adult education;Bedside commode;Other (comment);Hospital bed (hoyer lift)   Additional Comments: Pt reports aide who comes daily ("as long as I need him") and daughter perform all aspects of self care; pt able to feed herself.       Prior Functioning/Environment Level of Independence: Needs assistance  Gait / Transfers Assistance Needed: Pt reports bedbound and non-ambulatory for ~4 years. Hoyer lift for [employer] Needed: pt is able to clean upper body and needs assistance for lower body and back            OT Problem List:        OT Treatment/Interventions:      OT Goals(Current goals can be found in the care plan section) Acute Rehab OT Goals Patient Stated Goal: Return home  OT Goal Formulation: All assessment and education complete, DC therapy  OT Frequency:     Barriers to D/C:            Co-evaluation PT/OT/SLP Co-Evaluation/Treatment: Yes Reason for Co-Treatment: Complexity of the patient's impairments (multi-system involvement);For patient/therapist safety;To address functional/ADL transfers PT goals addressed during session: Mobility/safety with mobility OT goals addressed during session: ADL's and self-care      AM-PAC PT "6 Clicks" Daily Activity     Outcome Measure Help from another person eating meals?: A Little Help from another person taking care of personal grooming?: A Lot Help from another person toileting, which includes using toliet, bedpan, or urinal?: Total Help from another person bathing (including washing, rinsing, drying)?: Total Help from another person to put on and taking off regular upper body clothing?: Total Help from another person to put on and taking off regular lower body clothing?: Total 6 Click Score: 9   End of Session Equipment Utilized During Treatment: Other (comment);Oxygen (maximove) Nurse Communication: Mobility status;Need for lift  equipment  Activity Tolerance: Patient tolerated treatment well Patient left: in chair;with call bell/phone within reach                   Time: 1436-1511 OT Time Calculation (min): 35 min Charges:  OT General Charges $OT Visit: 1 Procedure OT Evaluation $OT Eval Moderate Complexity: 1 Procedure G-Codes:     Elan Brainerd A. Ulice Brilliant, M.S., OTR/L Pager: [place] 09/19/2016, 4:54 PM

## 2016-09-19 NOTE — Procedures (Signed)
Electroencephalogram (EEG) Report  Date of study: 09/19/16  Requesting clinician: Sharene Butters. PA  Reason for study: Evaluate for seizure  Brief clinical history: This is a an 83 year old woman admitted for encephalopathy. EEG is being performed for further evaluation.  Medications:  Current Facility-Administered Medications:  .  acetaminophen (TYLENOL) tablet 650 mg, 650 mg, Oral, Q6H PRN **OR** acetaminophen (TYLENOL) suppository 650 mg, 650 mg, Rectal, Q6H PRN, Shawn Route, Sara E, PA-C .  albuterol (PROVENTIL) (2.5 MG/3ML) 0.083% nebulizer solution 2.5 mg, 2.5 mg, Nebulization, Q6H PRN, Wertman, Sara E, PA-C .  apixaban (ELIQUIS) tablet 5 mg, 5 mg, Oral, BID, Wertman, Sara E, PA-C, 5 mg at 09/19/16 1106 .  bisacodyl (DULCOLAX) suppository 10 mg, 10 mg, Rectal, Daily PRN, Shawn Route, Sara E, PA-C .  calcium-vitamin D (OSCAL WITH D) 500-200 MG-UNIT per tablet 1 tablet, 1 tablet, Oral, BID, Rondel Jumbo, PA-C, 1 tablet at 09/19/16 1106 .  ceFEPIme (MAXIPIME) 1 g in dextrose 5 % 50 mL IVPB, 1 g, Intravenous, Q24H, Rumbarger, Valeda Malm, RPH, Stopped at 09/19/16 1137 .  dextromethorphan-guaiFENesin (MUCINEX DM) 30-600 MG per 12 hr tablet 1 tablet, 1 tablet, Oral, BID PRN, Rondel Jumbo, PA-C .  diclofenac sodium (VOLTAREN) 1 % transdermal gel 2 g, 2 g, Topical, QID PRN, Wertman, Sara E, PA-C .  docusate sodium (COLACE) capsule 100 mg, 100 mg, Oral, Daily PRN, Rondel Jumbo, PA-C .  ferrous sulfate tablet 325 mg, 325 mg, Oral, Q supper, Rondel Jumbo, PA-C, 325 mg at 09/18/16 1744 .  [START ON 09/20/2016] furosemide (LASIX) tablet 60 mg, 60 mg, Oral, BID, Candiss Norse, Margaree Mackintosh, MD .  hydrOXYzine (ATARAX/VISTARIL) tablet 25 mg, 25 mg, Oral, Q8H PRN, Shawn Route, Sara E, PA-C .  insulin aspart (novoLOG) injection 0-9 Units, 0-9 Units, Subcutaneous, TID WC, Rondel Jumbo, PA-C, 3 Units at 09/19/16 1234 .  insulin glargine (LANTUS) injection 25 Units, 25 Units, Subcutaneous, QHS, Rondel Jumbo, PA-C,  25 Units at 09/18/16 2302 .  ipratropium-albuterol (DUONEB) 0.5-2.5 (3) MG/3ML nebulizer solution 3 mL, 3 mL, Nebulization, Q6H PRN, Shawn Route, Sara E, PA-C .  metoprolol tartrate (LOPRESSOR) tablet 12.5 mg, 12.5 mg, Oral, BID, Wertman, Sara E, PA-C, 12.5 mg at 09/19/16 1105 .  ondansetron (ZOFRAN) tablet 4 mg, 4 mg, Oral, Q6H PRN **OR** ondansetron (ZOFRAN) injection 4 mg, 4 mg, Intravenous, Q6H PRN, Shawn Route, Sara E, PA-C .  pantoprazole (PROTONIX) EC tablet 40 mg, 40 mg, Oral, q morning - 10a, Wertman, Sara E, PA-C, 40 mg at 09/19/16 1105 .  polyethylene glycol (MIRALAX / GLYCOLAX) packet 17 g, 17 g, Oral, Daily PRN, Shawn Route, Sara E, PA-C .  potassium chloride SA (K-DUR,KLOR-CON) CR tablet 20 mEq, 20 mEq, Oral, Daily, Wertman, Coralee Pesa, PA-C, 20 mEq at 09/19/16 1105 .  pregabalin (LYRICA) capsule 75 mg, 75 mg, Oral, QHS, Rondel Jumbo, PA-C, 75 mg at 09/18/16 2301 .  rOPINIRole (REQUIP) tablet 4 mg, 4 mg, Oral, BID, Rondel Jumbo, PA-C, 4 mg at 09/18/16 2301 .  senna-docusate (Senokot-S) tablet 1 tablet, 1 tablet, Oral, QHS PRN, Rondel Jumbo, PA-C .  Vitamin D (Ergocalciferol) (DRISDOL) capsule 50,000 Units, 50,000 Units, Oral, Q7 days, Elease Hashimoto, 50,000 Units at 09/19/16 1230  Description: This is a routine EEG performed using standard international 10-20 electrode placement. A total of 18 channels are recorded, including one for the EKG. Wakefulness, drowsiness, and sleep were all recorded.  Activating Maneuvers:  None  Findings:  The EKG channel demonstrates an irregularly  irregular rhythm with a rate of 90 beats per minute.   Wakefulness is briefly recorded and consists of well-formed alpha activity. The best dominant posterior rhythm is 9-10. This is symmetric and reacts as expected with eye opening.   There are no focal asymmetries. No epileptiform discharges are present. No seizures are recorded.   Drowsiness is recorded and is normal in appearance. Early stages of  sleep are recorded and demonstrate normal architecture. Transitions between sleep and wakefulness are normal.   Impression:  This is a normal awake, drowsy, sleep EEG.  Clinical correlation: This normal EEG does not exclude the diagnosis of seizure or epilepsy. Clinical correlation is required.   Melba Coon, MD [facility]

## 2016-09-19 NOTE — Progress Notes (Signed)
@IPLOG         PROGRESS NOTE                                                                                                                                                                                                             Patient Demographics:    Elizabeth Pugh, is a 83 y.o. female, DOB - 03-05-1927, RXV:[id number]  Admit date - 09/18/2016   Admitting Physician Lady Deutscher, MD  Outpatient Primary MD for the patient is Nolene Ebbs, MD  LOS - 1  Chief Complaint  Patient presents with  . Altered Mental Status       Brief Narrative   Elizabeth Pugh is a 83 y.o. female with medical history significant for morbid obesity, history of CVA  Now bedbound, chronic syst and diast ChF EF 45%, COPD on  4l [place] 45 % and Bipap at night, CAF on Eliquis, GERD, HTN, depression, anxiety, h/o seizures.  Who has had multiple admissions, this is the seventh admission in the last 6 months, she seems to have recurrent issues with mild cellulitis and UTI. She usually responds to antibiotics in 1-2 days and then goes home. Clearly she will be better placed at an SNF where small infections can be treated and hospital admissions can be prevented.  Usually turns around within 24-48 hours and then discharges home as in the past finance has been a hindrance on SNF placement. He was admitted again for a mild episode of UTI within 12 hours she is close to her baseline.   Subjective:    Elizabeth Pugh today has, No headache, No chest pain, No abdominal pain - No Nausea, No new weakness tingling or numbness, No Cough - SOB.     Assessment  & Plan :    Principal Problem:   Acute encephalopathy Active Problems:   Chronic atrial fibrillation (HCC)   Chronic combined systolic and diastolic heart failure (HCC)   Urinary tract infection without hematuria   1.Acute toxic encephalopathy due to UTI. History of multiple admissions in the last 6 months for mild UTI or cellulitis. She usually responds to  antibiotics within 24-48 hours and then is stable for discharge.  Again she was placed on empiric IV antibiotics for UTI, has responded very well and close to baseline this morning, head CT was unremarkable, she has no focal deficits, follow cultures continue empiric antibiotic for now, we'll leave her on Maxipime and follow cultures. We'll have PT and social work evaluation for SNF placement as clearly she requires  SNF to avoid future hospitalizations, she usually gets minor infections which require a few days of antibiotics and then she is back to her baseline, if she is placed to SNF hospital admissions can easily be avoided in the future. In the past finance has been an issue we'll see if we can provide a letter of guarantee. Patient herself is agreeable to placement.   2. Chronic combined systolic and diastolic CHF EF 62%. She is clinically compensated, has trace to 1+ lower extremity edema which is chronic for her, continue fluid and salt restriction, continue beta blocker along with Lasix from 09/20/2016.  3. GERD. On PPI.  4. Morbid obesity with obstructive sleep apnea. Noncompliant with CPAP despite multiple counseling, continue supportive care and follow with PCP for weight loss.  5. History of stroke and chronic deconditioning and weakness. Bedbound, continue supportive care with Eliquis. PT well.  6. Chronic A. fib with PE, [place] vasc 2 score 4. Continue Eliquis along with Lopressor for rate control.  7. ARF. Due to dehydration, Lasix dose held today, has been gently hydrated with improvement in renal function, commence 60 mg twice a day of Lasix from tomorrow.  8. DM type II. On Lantus and sliding scale monitor.  CBG (last 3)   Recent Labs  09/18/16 1652 09/18/16 2143 09/19/16 0742  GLUCAP 164* 261* 175*      Diet : Diet Carb Modified Fluid consistency: Thin; Room service appropriate? Yes; Fluid restriction: 1500 mL Fluid    Family Communication  :  None  Code Status :   Full  Disposition Plan  :  SNF preferred  Consults  :  None  Procedures  :    CT head -ve  DVT Prophylaxis  :  Eliquis  Lab Results  Component Value Date   PLT 129 (L) 09/19/2016    Inpatient Medications  Scheduled Meds: . apixaban  5 mg Oral BID  . calcium-vitamin D  1 tablet Oral BID  . ferrous sulfate  325 mg Oral Q supper  . [START ON 09/20/2016] furosemide  80 mg Oral BID  . insulin aspart  0-9 Units Subcutaneous TID WC  . insulin glargine  25 Units Subcutaneous QHS  . metoprolol tartrate  12.5 mg Oral BID  . pantoprazole  40 mg Oral q morning - 10a  . potassium chloride SA  20 mEq Oral Daily  . pregabalin  75 mg Oral QHS  . rOPINIRole  4 mg Oral BID  . Vitamin D (Ergocalciferol)  50,000 Units Oral Q7 days   Continuous Infusions: . ceFEPime (MAXIPIME) IV    . [START ON 09/20/2016] vancomycin     PRN Meds:.acetaminophen **OR** acetaminophen, albuterol, bisacodyl, dextromethorphan-guaiFENesin, diclofenac sodium, docusate sodium, hydrOXYzine, ipratropium-albuterol, ondansetron **OR** ondansetron (ZOFRAN) IV, polyethylene glycol, senna-docusate  Antibiotics  :    Anti-infectives    Start     Dose/Rate Route Frequency Ordered Stop   09/20/16 1100  vancomycin (VANCOCIN) 1,500 mg in sodium chloride 0.9 % 500 mL IVPB     1,500 mg 250 mL/hr over 120 Minutes Intravenous Every 48 hours 09/18/16 1151     09/19/16 1100  ceFEPIme (MAXIPIME) 1 g in dextrose 5 % 50 mL IVPB     1 g 100 mL/hr over 30 Minutes Intravenous Every 24 hours 09/18/16 1151     09/18/16 1030  ceFEPIme (MAXIPIME) 2 g in dextrose 5 % 50 mL IVPB     2 g 100 mL/hr over 30 Minutes Intravenous  Once 09/18/16 1022 09/18/16  1115   09/18/16 1030  vancomycin (VANCOCIN) 2,000 mg in sodium chloride 0.9 % 500 mL IVPB     2,000 mg 250 mL/hr over 120 Minutes Intravenous  Once 09/18/16 1022 09/18/16 1417         Objective:   Vitals:   09/18/16 1656 09/18/16 2141 09/19/16 0445 09/19/16 0900  BP: 116/76 120/62  116/62 106/63  Pulse: 87 85 92 82  Resp: 20 16 16 16   Temp: 97.6 F (36.4 C) 97.8 F (36.6 C) 97.7 F (36.5 C) 97.7 F (36.5 C)  TempSrc: Oral Oral Oral Oral  SpO2: 92% 100% 100% 100%  Weight:  124.3 kg (274 lb 1.6 oz)      Wt Readings from Last 3 Encounters:  09/18/16 124.3 kg (274 lb 1.6 oz)  09/07/16 127 kg (280 lb)  08/28/16 121.6 kg (268 lb)     Intake/Output Summary (Last 24 hours) at 09/19/16 1029 Last data filed at 09/19/16 0600  Gross per 24 hour  Intake             1945 ml  Output                3 ml  Net             1942 ml     Physical Exam  Awake Alert,  No new F.N deficits, Normal affect [place].AT,PERRAL Supple Neck,No JVD, No cervical lymphadenopathy appriciated.  Symmetrical Chest wall movement, Good air movement bilaterally, CTAB RRR,No Gallops,Rubs or new Murmurs, No Parasternal Heave +ve B.Sounds, Abd Soft, No tenderness, No organomegaly appriciated, No rebound - guarding or rigidity. No Cyanosis, Clubbing , 1+ bilat leg edema, No new Rash or bruise       Data Review:    CBC  Recent Labs Lab 09/18/16 1215 09/19/16 0452  WBC 6.8 4.3  HGB 10.0* 9.8*  HCT 33.8* 33.0*  PLT 137* 129*  MCV 96.3 97.3  MCH 28.5 28.9  MCHC 29.6* 29.7*  RDW 15.1 15.0  LYMPHSABS 0.9  --   MONOABS 0.1  --   EOSABS 0.0  --   BASOSABS 0.0  --     Chemistries   Recent Labs Lab 09/18/16 1109 09/19/16 0452  NA 142 141  K 4.4 3.9  CL 101 101  CO2 33* 32  GLUCOSE 222* 226*  BUN 32* 32*  CREATININE 1.60* 1.40*  CALCIUM 9.1 8.6*  AST 21 15  ALT 12* 10*  ALKPHOS 48 47  BILITOT 1.0 0.8   ------------------------------------------------------------------------------------------------------------------ No results for input(s): CHOL, HDL, LDLCALC, TRIG, CHOLHDL, LDLDIRECT in the last 72 hours.  Lab Results  Component Value Date   HGBA1C 8.7 (H) 08/23/2016    ------------------------------------------------------------------------------------------------------------------ No results for input(s): TSH, T4TOTAL, T3FREE, THYROIDAB in the last 72 hours.  Invalid input(s): FREET3 ------------------------------------------------------------------------------------------------------------------ No results for input(s): VITAMINB12, FOLATE, FERRITIN, TIBC, IRON, RETICCTPCT in the last 72 hours.  Coagulation profile  Recent Labs Lab 09/19/16 0452  INR 1.61    No results for input(s): DDIMER in the last 72 hours.  Cardiac Enzymes No results for input(s): CKMB, TROPONINI, MYOGLOBIN in the last 168 hours.  Invalid input(s): CK ------------------------------------------------------------------------------------------------------------------    Component Value Date/Time   BNP 187.2 (H) 09/01/2016 0602    Micro Results No results found for this or any previous visit (from the past 240 hour(s)).  Radiology Reports X-ray Chest Pa And Lateral  Result Date: 09/19/2016 CLINICAL DATA:  CHF, productive cough EXAM: CHEST  2 VIEW COMPARISON:  09/18/2016 FINDINGS:  Cardiomegaly with mild vascular congestion. No overt edema or confluent opacities. No effusions. No acute bony abnormality. IMPRESSION: Cardiomegaly, vascular congestion. Electronically Signed   By: Rolm Baptise M.D.   On: 09/19/2016 08:40   Dg Tibia/fibula Left  Result Date: 09/18/2016 CLINICAL DATA:  Cellulitis. EXAM: LEFT TIBIA AND FIBULA - 2 VIEW COMPARISON:  Femur films from the same day. Knee radiographs 08/25/2006. FINDINGS: Marked osteopenia is present. Progressive degenerative changes are noted in the knee, particularly in the medial compartment. Posterolateral cortical thickening of proximal tibia is chronic. No focal erosive changes are present. Extensive soft tissue swelling is noted lateral to the knee. IMPRESSION: 1. Extensive soft tissue swelling laterally without acute osseous  abnormality. 2. Advanced degenerative changes of the knee have progressed. 3. Marked osteopenia. Electronically Signed   By: San Morelle M.D.   On: 09/18/2016 15:19   Ct Head Wo Contrast  Result Date: 09/18/2016 CLINICAL DATA:  Fever and altered mental status EXAM: CT HEAD WITHOUT CONTRAST TECHNIQUE: Contiguous axial images were obtained from the base of the skull through the vertex without intravenous contrast. COMPARISON:  June 16, 2016 FINDINGS: Brain: The ventricles are normal in size and configuration for age. There is mild sulcal atrophy and moderate cerebellar atrophy, stable. There is no intracranial mass, hemorrhage, extra-axial fluid collection, or midline shift. There is small vessel disease in the basilar perforator distribution of the pons. There is slight small vessel disease in the centra semiovale bilaterally. There are prior lacunar type infarcts in the lateral left basal ganglia region, primarily in the extreme capsule region on the left. There is no acute appearing infarct evident. Vascular: There is no demonstrable hyperdense vessel. There is calcification in each carotid siphon region. There is also slight calcification in the right middle cerebral artery. Skull: The bony calvarium appears intact. Sinuses/Orbits: There is mucosal thickening in several ethmoid air cells. There is a retention cyst in the inferior right maxillary antrum. Other paranasal sinuses are clear. Orbits appear symmetric bilaterally. Other: There is opacification in several inferior mastoid air cells bilaterally, somewhat more on the right than on the left. IMPRESSION: Stable areas of atrophy with slight supratentorial infratentorial small vessel disease. Prior lacunar type infarcts noted in the left extreme capsule region. No intracranial mass, hemorrhage, or acute appearing infarct. There are areas of arteriovascular calcification bilaterally. There is mastoid disease bilaterally inferiorly as well as  areas of paranasal sinus disease. Electronically Signed   By: Lowella Grip III M.D.   On: 09/18/2016 16:29   Dg Chest Port 1 View  Result Date: 09/18/2016 CLINICAL DATA:  Sepsis EXAM: PORTABLE CHEST 1 VIEW COMPARISON:  08/31/2016 FINDINGS: Cardiomegaly evident with vascular congestion. Background COPD/ emphysema suspected. No superimposed edema, pneumonia, collapse or consolidation. No large effusion or pneumothorax. Trachea is midline. Aorta is atherosclerotic. Degenerative changes of the spine and shoulders. Bones are osteopenic. IMPRESSION: Cardiomegaly with vascular congestion No superimposed acute process. Electronically Signed   By: Jerilynn Mages.  Shick M.D.   On: 09/18/2016 11:25   Dg Chest Port 1 View  Result Date: 08/31/2016 CLINICAL DATA:  Altered mental status.  Fever and dyspnea. EXAM: PORTABLE CHEST 1 VIEW COMPARISON:  08/11/2016 FINDINGS: Unchanged moderate cardiomegaly. Mild vascular prominence, stable and likely chronic. Mild pleural thickening in the lateral and apical regions bilaterally, chronic. No confluent airspace consolidation. IMPRESSION: Stable cardiomegaly. Stable mild vascular prominence. No consolidation or large effusion. Electronically Signed   By: Andreas Newport M.D.   On: 08/31/2016 23:58   Dg Femur Min  2 Views Left  Result Date: 09/18/2016 CLINICAL DATA:  Cellulitis. EXAM: LEFT FEMUR 2 VIEWS COMPARISON:  None. FINDINGS: Nonspecific reticulation of the leg without evidence of gas or opaque foreign body. Marked osteopenia. Advanced knee osteoarthritis with bone-on-bone contact. IMPRESSION: 1. History of cellulitis. No soft tissue emphysema or opaque foreign body. No acute osseous finding. 2. Osteopenia and advanced knee osteoarthritis. Electronically Signed   By: Monte Fantasia M.D.   On: 09/18/2016 15:08    Time Spent in minutes  30   Lala Lund M.D on 09/19/2016 at 10:29 AM  Between 7am to 7pm - Pager - [phone] ( page via [email or web address], text pages only, please  mention full 10 digit call back number). After 7pm go to [email or web address] - password [email or web address]

## 2016-09-19 NOTE — Evaluation (Signed)
Physical Therapy Evaluation Patient Details Name: Elizabeth Pugh MRN: [id number] DOB: 1926/08/11 Today's Date: 09/19/2016   History of Present Illness  Pt is an 83 yo female admitted through ED on 09/18/16 with confusion related to a possible UTI. Pt has had 7 admissions in the past 6 months with mild LLE cellulitis 4/21-4/27 and sepsis 5/1-5/7. Pt had an EEG performed which was normal. PMH significant for Obesity, CVA, Bed bound, CHF EF 45%, COPD on 2L via [place], GERD, HTN, Depression, anxiety and H/o siezures.   Clinical Impression  Pt presents with the above diagnosis and below deficits for therapy evaluation. Prior to admission pt was completely independent and bed bound receiving assistance from her daughter and other family members 24hrs a day. Pt was getting OOB a few times a week when her grandson was available to assist with hoyer transfer. Pt has had multiple hospitalization over the past 6 months leading to continued deconditioning. Pt will benefit from continued acute PT services in order to address below deficits and assist with HEP prior to discharge to venue recommended below.     Follow Up Recommendations SNF;Supervision/Assistance - 24 hour;Other (comment) (Family may refuse and at that time will require HHPT)    Equipment Recommendations  None recommended by PT    Recommendations for Other Services       Precautions / Restrictions Precautions Precautions: Fall Restrictions Weight Bearing Restrictions: No      Mobility  Bed Mobility Overal bed mobility: Needs Assistance Bed Mobility: Supine to Sit;Rolling Rolling: Max assist;+2 for physical assistance   Supine to sit: Max assist;+2 for physical assistance     General bed mobility comments: Pt able to assist with rolling side to side by reaching onto railing, requires more assistance rolling to right with LLE. Max A x2 from supine to sit to EOB. Max A to maintain sitting at EOB  Transfers Overall transfer level:  Needs assistance Equipment used: Ambulation equipment used             General transfer comment: Total A transfer via maximove  Ambulation/Gait                Stairs            Wheelchair Mobility    Modified Rankin (Stroke Patients Only)       Balance Overall balance assessment: Needs assistance Sitting-balance support: Bilateral upper extremity supported Sitting balance-Leahy Scale: Zero Sitting balance - Comments: Pt requires Mod to Max A to maintain sitting at EOB.  Postural control: Left lateral lean;Posterior lean                                   Pertinent Vitals/Pain Pain Assessment: Faces Faces Pain Scale: Hurts little more Pain Location: L LE with mobility Pain Descriptors / Indicators: Grimacing;Discomfort Pain Intervention(s): Monitored during session;Repositioned    Home Living Family/patient expects to be discharged to:: Private residence Living Arrangements: Children Available Help at Discharge: Family;Available 24 hours/day;Personal care attendant Type of Home: House Home Access: Ramped entrance     Home Layout: One level Home Equipment: Wheelchair - Teacher, adult education;Bedside commode;Other (comment);Hospital bed (hoyer lift) Additional Comments: Pt reports aide who comes daily ("as long as I need him") and daughter perform all aspects of self care; pt able to feed herself.     Prior Function Level of Independence: Needs assistance   Gait / Transfers Assistance Needed: Pt reports bedbound  and non-ambulatory for ~4 years. Hoyer lift for [employer] Needed: pt is able to clean upper body and needs assistance for lower body and back        Hand Dominance   Dominant Hand: Right    Extremity/Trunk Assessment   Upper Extremity Assessment Upper Extremity Assessment: Defer to OT evaluation    Lower Extremity Assessment Lower Extremity Assessment: LLE deficits/detail;RLE  deficits/detail RLE Deficits / Details: Gross RLE strength 2/5 with edema noted posteriorly.  LLE Deficits / Details: Gross LLE strength 1+/5; redness and edema noted.        Communication   Communication: No difficulties  Cognition Arousal/Alertness: Awake/alert Behavior During Therapy: WFL for tasks assessed/performed Overall Cognitive Status: Within Functional Limits for tasks assessed                                        General Comments      Exercises     Assessment/Plan    PT Assessment Patient needs continued PT services  PT Problem List Decreased strength;Decreased range of motion;Decreased activity tolerance;Decreased balance;Decreased mobility       PT Treatment Interventions Functional mobility training;Therapeutic activities;Therapeutic exercise;Balance training;Patient/family education    PT Goals (Current goals can be found in the Care Plan section)  Acute Rehab PT Goals Patient Stated Goal: Return home  PT Goal Formulation: With patient Time For Goal Achievement: 10/03/16 Potential to Achieve Goals: Good    Frequency Min 2X/week   Barriers to discharge        Co-evaluation PT/OT/SLP Co-Evaluation/Treatment: Yes Reason for Co-Treatment: Complexity of the patient's impairments (multi-system involvement);For patient/therapist safety;To address functional/ADL transfers PT goals addressed during session: Mobility/safety with mobility         AM-PAC PT "6 Clicks" Daily Activity  Outcome Measure Difficulty turning over in bed (including adjusting bedclothes, sheets and blankets)?: Total Difficulty moving from lying on back to sitting on the side of the bed? : Total Difficulty sitting down on and standing up from a chair with arms (e.g., wheelchair, bedside commode, etc,.)?: Total Help needed moving to and from a bed to chair (including a wheelchair)?: Total Help needed walking in hospital room?: Total Help needed climbing 3-5 steps  with a railing? : Total 6 Click Score: 6    End of Session Equipment Utilized During Treatment: Oxygen Activity Tolerance: Patient limited by fatigue Patient left: in chair;with call bell/phone within reach Nurse Communication: Mobility status;Need for lift equipment PT Visit Diagnosis: Muscle weakness (generalized) (M62.81)    Time: 7564-3329 PT Time Calculation (min) (ACUTE ONLY): 34 min   Charges:   PT Evaluation $PT Eval Moderate Complexity: 1 Procedure     PT G Codes:        Scheryl Marten PT, DPT  [phone]   Jacqulyn Liner Sloan Leiter 09/19/2016, 3:35 PM

## 2016-09-19 NOTE — Progress Notes (Signed)
OT Cancellation Note  Patient Details Name: SADEEL FIDDLER MRN: [id number] DOB: 05/26/1926   Cancelled Treatment:    Reason Eval/Treat Not Completed: Patient at procedure or test/ unavailable. Will follow up as time allows.  Binnie Kand M.S., OTR/L Pager: [phone]  09/19/2016, 11:31 AM

## 2016-09-20 LAB — GLUCOSE, CAPILLARY
Glucose-Capillary: 237 mg/dL — ABNORMAL HIGH (ref 65–99)
Glucose-Capillary: 275 mg/dL — ABNORMAL HIGH (ref 65–99)
Glucose-Capillary: 301 mg/dL — ABNORMAL HIGH (ref 65–99)
Glucose-Capillary: 276 mg/dL — ABNORMAL HIGH (ref 65–99)

## 2016-09-20 LAB — BASIC METABOLIC PANEL
Anion gap: 9 (ref 5–15)
BUN: 30 mg/dL — AB (ref 6–20)
CALCIUM: 9 mg/dL (ref 8.9–10.3)
CO2: 30 mmol/L (ref 22–32)
Chloride: 101 mmol/L (ref 101–111)
Creatinine, Ser: 1.46 mg/dL — ABNORMAL HIGH (ref 0.44–1.00)
GFR calc Af Amer: 36 mL/min — ABNORMAL LOW (ref >60)
GFR, EST NON AFRICAN AMERICAN: 31 mL/min — AB (ref >60)
GLUCOSE: 261 mg/dL — AB (ref 65–99)
Potassium: 5 mmol/L (ref 3.5–5.1)
Sodium: 140 mmol/L (ref 135–145)

## 2016-09-20 MED ORDER — FUROSEMIDE 40 MG PO TABS
60.0000 mg | ORAL_TABLET | Freq: Every day | ORAL | Status: DC
  Administered 2016-09-20 – 2016-09-21 (×2): 60 mg via ORAL
  Filled 2016-09-20 (×2): qty 1

## 2016-09-20 MED ORDER — SODIUM POLYSTYRENE SULFONATE 15 GM/60ML PO SUSP
15.0000 g | Freq: Once | ORAL | Status: AC
  Administered 2016-09-20: 15 g via ORAL
  Filled 2016-09-20: qty 60

## 2016-09-20 NOTE — Progress Notes (Signed)
@IPLOG         PROGRESS NOTE                                                                                                                                                                                                             Patient Demographics:    Elizabeth Pugh, is a 83 y.o. female, DOB - 1927-04-19, LPF:[id number]  Admit date - 09/18/2016   Admitting Physician Lady Deutscher, MD  Outpatient Primary MD for the patient is Nolene Ebbs, MD  LOS - 2  Chief Complaint  Patient presents with  . Altered Mental Status       Brief Narrative   Elizabeth Pugh is a 83 y.o. female with medical history significant for morbid obesity, history of CVA  Now bedbound, chronic syst and diast ChF EF 45%, COPD on  4l [place] 45 % and Bipap at night, CAF on Eliquis, GERD, HTN, depression, anxiety, h/o seizures.  Who has had multiple admissions, this is the seventh admission in the last 6 months, she seems to have recurrent issues with mild cellulitis and UTI. She usually responds to antibiotics in 1-2 days and then goes home. Clearly she will be better placed at an SNF where small infections can be treated and hospital admissions can be prevented.  Usually turns around within 24-48 hours and then discharges home as in the past finance has been a hindrance on SNF placement. He was admitted again for a mild episode of UTI within 12 hours she is close to her baseline.   Subjective:   Patient in bed, appears comfortable, denies any headache, no fever, no chest pain or pressure, no shortness of breath , no abdominal pain. No focal weakness.    Assessment  & Plan :     1.Acute toxic encephalopathy due to UTI. History of multiple admissions in the last 6 months for mild UTI or cellulitis. She usually responds to antibiotics within 24-48 hours and then is stable for discharge.  Again she was placed on empiric IV antibiotics for UTI, has responded very well and close to baseline this morning, head  CT was unremarkable, she has no focal deficits, follow cultures continue empiric antibiotic for now, we'll leave her on Maxipime and follow cultures. We'll have PT and social work evaluation for SNF placement as clearly she requires SNF to avoid future hospitalizations, she usually gets minor infections which require a few days of antibiotics and then she is back to her baseline, if she is placed to [facility] hospital admissions  can easily be avoided in the future. In the past finance has been an issue we'll see if we can provide a letter of guarantee. Patient herself is agreeable to placement.   2. Chronic combined systolic and diastolic CHF EF 78%. She is clinically compensated, has only trace edema which is down from 3+ which she usually has, weight is currently at 274 pounds, due to mild ARF have cut down home Lasix dose to 60 mg once a day and will monitor BMP closely.  3. GERD. On PPI which will be continued.  4. Morbid obesity with obstructive sleep apnea. He again remains noncompliant with CPAP which is consistent with her previous admissions, continue supportive care follow with PCP for weight loss.   5. History of stroke and chronic deconditioning and weakness. She is now bedbound and diffusely deconditioned, PT eval, may require SNF placement which I think will be most beneficial for her, currently on Eliquis will monitor Eliquis does and her renal function.   6. Chronic A. fib with PE, [place] vasc 2 score 4. On Lopressor and Eliquis.    7. ARF. Due rapid diuresis, have cut down Lasix dose will monitor.  8. DM type II. On Lantus and sliding scale monitor.  CBG (last 3)   Recent Labs  09/19/16 1147 09/19/16 2122 09/20/16 0728  GLUCAP 240* 274* 237*      Diet : Diet Carb Modified Fluid consistency: Thin; Room service appropriate? Yes; Fluid restriction: 1500 mL Fluid    Family Communication  :  None  Code Status :  Full  Disposition Plan  :  SNF preferred  Consults  :   None  Procedures  :    CT head -ve  DVT Prophylaxis  :  Eliquis  Lab Results  Component Value Date   PLT 129 (L) 09/19/2016    Inpatient Medications  Scheduled Meds: . apixaban  5 mg Oral BID  . calcium-vitamin D  1 tablet Oral BID  . ferrous sulfate  325 mg Oral Q supper  . furosemide  60 mg Oral Daily  . insulin aspart  0-9 Units Subcutaneous TID WC  . insulin glargine  25 Units Subcutaneous QHS  . metoprolol tartrate  12.5 mg Oral BID  . pantoprazole  40 mg Oral q morning - 10a  . pregabalin  75 mg Oral QHS  . rOPINIRole  4 mg Oral BID  . sodium polystyrene  15 g Oral Once  . Vitamin D (Ergocalciferol)  50,000 Units Oral Q7 days   Continuous Infusions: . ceFEPime (MAXIPIME) IV Stopped (09/19/16 1137)   PRN Meds:.acetaminophen **OR** acetaminophen, albuterol, bisacodyl, dextromethorphan-guaiFENesin, diclofenac sodium, docusate sodium, hydrOXYzine, ipratropium-albuterol, ondansetron **OR** ondansetron (ZOFRAN) IV, polyethylene glycol, senna-docusate  Antibiotics  :    Anti-infectives    Start     Dose/Rate Route Frequency Ordered Stop   09/20/16 1100  vancomycin (VANCOCIN) 1,500 mg in sodium chloride 0.9 % 500 mL IVPB  Status:  Discontinued     1,500 mg 250 mL/hr over 120 Minutes Intravenous Every 48 hours 09/18/16 1151 09/19/16 1034   09/19/16 1100  ceFEPIme (MAXIPIME) 1 g in dextrose 5 % 50 mL IVPB     1 g 100 mL/hr over 30 Minutes Intravenous Every 24 hours 09/18/16 1151     09/18/16 1030  ceFEPIme (MAXIPIME) 2 g in dextrose 5 % 50 mL IVPB     2 g 100 mL/hr over 30 Minutes Intravenous  Once 09/18/16 1022 09/18/16 1115   09/18/16 1030  vancomycin (VANCOCIN) 2,000 mg in sodium chloride 0.9 % 500 mL IVPB     2,000 mg 250 mL/hr over 120 Minutes Intravenous  Once 09/18/16 1022 09/18/16 1417         Objective:   Vitals:   09/19/16 1500 09/19/16 1842 09/19/16 2118 09/20/16 0615  BP:  118/73 112/79 125/66  Pulse:  84 80 86  Resp:  17 17 16   Temp:  97.5 F  (36.4 C) 97.4 F (36.3 C) 97.5 F (36.4 C)  TempSrc:  Oral Oral Oral  SpO2:  100% 100% 100%  Weight:      Height: 5\' 6"  (1.676 m)       Wt Readings from Last 3 Encounters:  09/18/16 124.3 kg (274 lb 1.6 oz)  09/07/16 127 kg (280 lb)  08/28/16 121.6 kg (268 lb)     Intake/Output Summary (Last 24 hours) at 09/20/16 0936 Last data filed at 09/20/16 0800  Gross per 24 hour  Intake             1130 ml  Output                0 ml  Net             1130 ml     Physical Exam Awake Alert,  No new F.N deficits, Normal affect .AT,PERRAL Supple Neck,No JVD, No cervical lymphadenopathy appriciated.  Symmetrical Chest wall movement, Good air movement bilaterally, CTAB RRR,No Gallops,Rubs or new Murmurs, No Parasternal Heave +ve B.Sounds, Abd Soft, No tenderness, No organomegaly appriciated, No rebound - guarding or rigidity. No Cyanosis, Clubbing , trace edema, No new Rash or bruise     Data Review:    CBC  Recent Labs Lab 09/18/16 1215 09/19/16 0452  WBC 6.8 4.3  HGB 10.0* 9.8*  HCT 33.8* 33.0*  PLT 137* 129*  MCV 96.3 97.3  MCH 28.5 28.9  MCHC 29.6* 29.7*  RDW 15.1 15.0  LYMPHSABS 0.9  --   MONOABS 0.1  --   EOSABS 0.0  --   BASOSABS 0.0  --     Chemistries   Recent Labs Lab 09/18/16 1109 09/19/16 0452 09/20/16 0421  NA 142 141 140  K 4.4 3.9 5.0  CL 101 101 101  CO2 33* 32 30  GLUCOSE 222* 226* 261*  BUN 32* 32* 30*  CREATININE 1.60* 1.40* 1.46*  CALCIUM 9.1 8.6* 9.0  AST 21 15  --   ALT 12* 10*  --   ALKPHOS 48 47  --   BILITOT 1.0 0.8  --    ------------------------------------------------------------------------------------------------------------------ No results for input(s): CHOL, HDL, LDLCALC, TRIG, CHOLHDL, LDLDIRECT in the last 72 hours.  Lab Results  Component Value Date   HGBA1C 8.7 (H) 08/23/2016   ------------------------------------------------------------------------------------------------------------------ No results for  input(s): TSH, T4TOTAL, T3FREE, THYROIDAB in the last 72 hours.  Invalid input(s): FREET3 ------------------------------------------------------------------------------------------------------------------ No results for input(s): VITAMINB12, FOLATE, FERRITIN, TIBC, IRON, RETICCTPCT in the last 72 hours.  Coagulation profile  Recent Labs Lab 09/19/16 0452  INR 1.61    No results for input(s): DDIMER in the last 72 hours.  Cardiac Enzymes No results for input(s): CKMB, TROPONINI, MYOGLOBIN in the last 168 hours.  Invalid input(s): CK ------------------------------------------------------------------------------------------------------------------    Component Value Date/Time   BNP 187.2 (H) 09/01/2016 0602    Micro Results Recent Results (from the past 240 hour(s))  Culture, blood (routine x 2)     Status: None (Preliminary result)   Collection Time: 09/18/16 10:20 AM  Result Value Ref Range Status   Specimen Description BLOOD LEFT FOREARM  Final   Special Requests   Final    BOTTLES DRAWN AEROBIC ONLY Blood Culture adequate volume   Culture NO GROWTH < 24 HOURS  Final   Report Status PENDING  Incomplete  Urine culture     Status: None   Collection Time: 09/18/16 10:23 AM  Result Value Ref Range Status   Specimen Description URINE, CATHETERIZED  Final   Special Requests NONE  Final   Culture NO GROWTH  Final   Report Status 09/19/2016 FINAL  Final  Culture, blood (routine x 2)     Status: None (Preliminary result)   Collection Time: 09/18/16 10:30 AM  Result Value Ref Range Status   Specimen Description BLOOD RIGHT FOREARM  Final   Special Requests   Final    BOTTLES DRAWN AEROBIC ONLY Blood Culture adequate volume   Culture NO GROWTH < 24 HOURS  Final   Report Status PENDING  Incomplete    Radiology Reports X-ray Chest Pa And Lateral  Result Date: 09/19/2016 CLINICAL DATA:  CHF, productive cough EXAM: CHEST  2 VIEW COMPARISON:  09/18/2016 FINDINGS: Cardiomegaly  with mild vascular congestion. No overt edema or confluent opacities. No effusions. No acute bony abnormality. IMPRESSION: Cardiomegaly, vascular congestion. Electronically Signed   By: Rolm Baptise M.D.   On: 09/19/2016 08:40   Dg Tibia/fibula Left  Result Date: 09/18/2016 CLINICAL DATA:  Cellulitis. EXAM: LEFT TIBIA AND FIBULA - 2 VIEW COMPARISON:  Femur films from the same day. Knee radiographs 08/25/2006. FINDINGS: Marked osteopenia is present. Progressive degenerative changes are noted in the knee, particularly in the medial compartment. Posterolateral cortical thickening of proximal tibia is chronic. No focal erosive changes are present. Extensive soft tissue swelling is noted lateral to the knee. IMPRESSION: 1. Extensive soft tissue swelling laterally without acute osseous abnormality. 2. Advanced degenerative changes of the knee have progressed. 3. Marked osteopenia. Electronically Signed   By: San Morelle M.D.   On: 09/18/2016 15:19   Ct Head Wo Contrast  Result Date: 09/18/2016 CLINICAL DATA:  Fever and altered mental status EXAM: CT HEAD WITHOUT CONTRAST TECHNIQUE: Contiguous axial images were obtained from the base of the skull through the vertex without intravenous contrast. COMPARISON:  June 16, 2016 FINDINGS: Brain: The ventricles are normal in size and configuration for age. There is mild sulcal atrophy and moderate cerebellar atrophy, stable. There is no intracranial mass, hemorrhage, extra-axial fluid collection, or midline shift. There is small vessel disease in the basilar perforator distribution of the pons. There is slight small vessel disease in the centra semiovale bilaterally. There are prior lacunar type infarcts in the lateral left basal ganglia region, primarily in the extreme capsule region on the left. There is no acute appearing infarct evident. Vascular: There is no demonstrable hyperdense vessel. There is calcification in each carotid siphon region. There is also  slight calcification in the right middle cerebral artery. Skull: The bony calvarium appears intact. Sinuses/Orbits: There is mucosal thickening in several ethmoid air cells. There is a retention cyst in the inferior right maxillary antrum. Other paranasal sinuses are clear. Orbits appear symmetric bilaterally. Other: There is opacification in several inferior mastoid air cells bilaterally, somewhat more on the right than on the left. IMPRESSION: Stable areas of atrophy with slight supratentorial infratentorial small vessel disease. Prior lacunar type infarcts noted in the left extreme capsule region. No intracranial mass, hemorrhage, or acute appearing infarct. There are areas of arteriovascular  calcification bilaterally. There is mastoid disease bilaterally inferiorly as well as areas of paranasal sinus disease. Electronically Signed   By: Lowella Grip III M.D.   On: 09/18/2016 16:29   Dg Chest Port 1 View  Result Date: 09/18/2016 CLINICAL DATA:  Sepsis EXAM: PORTABLE CHEST 1 VIEW COMPARISON:  08/31/2016 FINDINGS: Cardiomegaly evident with vascular congestion. Background COPD/ emphysema suspected. No superimposed edema, pneumonia, collapse or consolidation. No large effusion or pneumothorax. Trachea is midline. Aorta is atherosclerotic. Degenerative changes of the spine and shoulders. Bones are osteopenic. IMPRESSION: Cardiomegaly with vascular congestion No superimposed acute process. Electronically Signed   By: Jerilynn Mages.  Shick M.D.   On: 09/18/2016 11:25   Dg Chest Port 1 View  Result Date: 08/31/2016 CLINICAL DATA:  Altered mental status.  Fever and dyspnea. EXAM: PORTABLE CHEST 1 VIEW COMPARISON:  08/11/2016 FINDINGS: Unchanged moderate cardiomegaly. Mild vascular prominence, stable and likely chronic. Mild pleural thickening in the lateral and apical regions bilaterally, chronic. No confluent airspace consolidation. IMPRESSION: Stable cardiomegaly. Stable mild vascular prominence. No consolidation or  large effusion. Electronically Signed   By: Andreas Newport M.D.   On: 08/31/2016 23:58   Dg Femur Min 2 Views Left  Result Date: 09/18/2016 CLINICAL DATA:  Cellulitis. EXAM: LEFT FEMUR 2 VIEWS COMPARISON:  None. FINDINGS: Nonspecific reticulation of the leg without evidence of gas or opaque foreign body. Marked osteopenia. Advanced knee osteoarthritis with bone-on-bone contact. IMPRESSION: 1. History of cellulitis. No soft tissue emphysema or opaque foreign body. No acute osseous finding. 2. Osteopenia and advanced knee osteoarthritis. Electronically Signed   By: Monte Fantasia M.D.   On: 09/18/2016 15:08    Time Spent in minutes  30   Lala Lund M.D on 09/20/2016 at 9:36 AM  Between 7am to 7pm - Pager - [phone] ( page via [email or web address], text pages only, please mention full 10 digit call back number). After 7pm go to [email or web address] - password [email or web address]

## 2016-09-20 NOTE — Discharge Instructions (Addendum)
Follow with Primary MD Nolene Ebbs, MD in 7 days   Get CBC, CMP, 2 view Chest X ray checked  by Primary MD or SNF MD in 5-7 days ( we routinely change or add medications that can affect your baseline labs and fluid status, therefore we recommend that you get the mentioned basic workup next visit with your PCP, your PCP may decide not to get them or add new tests based on their clinical decision)  Activity: As tolerated with Full fall precautions use walker/cane & assistance as needed  Disposition Home    Diet:   Diet Carb Modified -   Fluid restriction: 1500 mL Fluid/day    Accuchecks 4 times/day, Once in AM empty stomach and then before each meal. Log in all results and show them to your Prim.MD in 3 days. If any glucose reading is under 80 or above 300 call your Prim MD immidiately. Follow Low glucose instructions for glucose under 80 as instructed.  For Heart failure patients - Check your Weight same time everyday, if you gain over 2 pounds, or you develop in leg swelling, experience more shortness of breath or chest pain, call your Primary MD immediately. Follow Cardiac Low Salt Diet and 1.5 lit/day fluid restriction.  On your next visit with your primary care physician please Get Medicines reviewed and adjusted.  Please request your Prim.MD to go over all Hospital Tests and Procedure/Radiological results at the follow up, please get all Hospital records sent to your Prim MD by signing hospital release before you go home.  If you experience worsening of your admission symptoms, develop shortness of breath, life threatening emergency, suicidal or homicidal thoughts you must seek medical attention immediately by calling 911 or calling your MD immediately  if symptoms less severe.  You Must read complete instructions/literature along with all the possible adverse reactions/side effects for all the Medicines you take and that have been prescribed to you. Take any new Medicines after you  have completely understood and accpet all the possible adverse reactions/side effects.   Do not drive, operate heavy machinery, perform activities at heights, swimming or participation in water activities or provide baby sitting services if your were admitted for syncope or siezures until you have seen by Primary MD or a Neurologist and advised to do so again.  Do not drive when taking Pain medications.    Do not take more than prescribed Pain, Sleep and Anxiety Medications  Special Instructions: If you have smoked or chewed Tobacco  in the last 2 yrs please stop smoking, stop any regular Alcohol  and or any Recreational drug use.  Wear Seat belts while driving.   Please note  You were cared for by a hospitalist during your hospital stay. If you have any questions about your discharge medications or the care you received while you were in the hospital after you are discharged, you can call the unit and asked to speak with the hospitalist on call if the hospitalist that took care of you is not available. Once you are discharged, your primary care physician will handle any further medical issues. Please note that NO REFILLS for any discharge medications will be authorized once you are discharged, as it is imperative that you return to your primary care physician (or establish a relationship with a primary care physician if you do not have one) for your aftercare needs so that they can reassess your need for medications and monitor your lab values.

## 2016-09-21 LAB — BASIC METABOLIC PANEL
Anion gap: 10 (ref 5–15)
BUN: 27 mg/dL — AB (ref 6–20)
CALCIUM: 9 mg/dL (ref 8.9–10.3)
CHLORIDE: 97 mmol/L — AB (ref 101–111)
CO2: 29 mmol/L (ref 22–32)
Creatinine, Ser: 1.21 mg/dL — ABNORMAL HIGH (ref 0.44–1.00)
GFR calc Af Amer: 45 mL/min — ABNORMAL LOW (ref >60)
GFR calc non Af Amer: 39 mL/min — ABNORMAL LOW (ref >60)
Glucose, Bld: 239 mg/dL — ABNORMAL HIGH (ref 65–99)
POTASSIUM: 4.3 mmol/L (ref 3.5–5.1)
SODIUM: 136 mmol/L (ref 135–145)

## 2016-09-21 LAB — GLUCOSE, CAPILLARY
GLUCOSE-CAPILLARY: 229 mg/dL — AB (ref 65–99)
Glucose-Capillary: 263 mg/dL — ABNORMAL HIGH (ref 65–99)
Glucose-Capillary: 270 mg/dL — ABNORMAL HIGH (ref 65–99)
Glucose-Capillary: 221 mg/dL — ABNORMAL HIGH (ref 65–99)

## 2016-09-21 MED ORDER — FUROSEMIDE 20 MG PO TABS: 60.0000 mg | ORAL_TABLET | Freq: Two times a day (BID) | ORAL | Status: AC

## 2016-09-21 MED ORDER — LEVETIRACETAM 500 MG PO TABS
500.0000 mg | ORAL_TABLET | Freq: Two times a day (BID) | ORAL | Status: DC
  Administered 2016-09-21: 500 mg via ORAL
  Filled 2016-09-21: qty 1

## 2016-09-21 MED ORDER — INSULIN GLARGINE 100 UNIT/ML ~~LOC~~ SOLN
40.0000 [IU] | Freq: Every day | SUBCUTANEOUS | Status: DC
  Filled 2016-09-21: qty 0.4

## 2016-09-21 NOTE — NC FL2 (Signed)
[place] LEVEL OF CARE SCREENING TOOL     IDENTIFICATION  Patient Name: Elizabeth Pugh Birthdate: [birth year] Sex: female Admission Date (Current Location): 09/18/2016  [facility] and [place] Number:  Herbalist and Address:  [facility], [place] [address], [place], [place] [address]      Provider Number: [id number]  Attending Physician Name and Address:  Thurnell Lose, MD  Relative Name and Phone Number:  [name] - daughter. [phone] (mobile) and [phone] (home)    Current Level of Care: Hospital Recommended Level of Care: [place] Prior Approval Number:    Date Approved/Denied:   PASRR Number: [id number] (Eff. 01/23/15)  Discharge Plan: SNF    Current Diagnoses: Patient Active Problem List   Diagnosis Date Noted  . Confusion associated with infection 09/18/2016  . Cellulitis of left leg 08/23/2016  . Sepsis ([place]) 08/10/2016  . Morbid obesity with BMI of 45.0-49.9, adult ([place]) 08/10/2016  . Cellulitis of left lower extremity 08/10/2016  . Acute respiratory failure with hypercapnia ([place]) 07/13/2016  . Acute respiratory failure with hypoxia and hypercapnia ([place]) 06/16/2016  . Urinary tract infection without hematuria 06/16/2016  . HCAP (healthcare-associated pneumonia) 06/16/2016  . Acute respiratory failure ([place]) 06/16/2016  . CAP (community acquired pneumonia) 06/09/2016  . Cellulitis 12/16/2015  . Confusion 12/11/2015  . SIRS (systemic inflammatory response syndrome) ([place]) 12/11/2015  . Chronic respiratory failure with hypoxia and hypercapnia ([place]) 09/28/2015  . Acute encephalopathy   . Acute on chronic congestive heart failure ([place])   . Needs sleep apnea assessment   . Acute on chronic diastolic (congestive) heart failure ([place]) 09/22/2015  . Acute kidney injury ([place]) 09/02/2015  . Lobar pneumonia due to unspecified organism 09/02/2015  . Bilateral leg edema   . Acute on chronic  respiratory failure with hypoxia and hypercapnia ([place]) 08/29/2015  . History of pulmonary embolism 01/27/2015  . Chest pain at rest 01/27/2015  . Shortness of breath at rest 01/27/2015  . Chronic combined systolic and diastolic heart failure ([place]) 01/27/2015  . Thrombocytopenia ([place]) 01/27/2015  . Abnormal findings on radiological examination of gastrointestinal tract 01/27/2015  . Chronic atrial fibrillation ([place])   . Seizure disorder ([place])   . Atrial fibrillation with rapid ventricular response ([place])   . Anxiety   . Anemia   . Restless leg syndrome   . Acute delirium 04/12/2011  . DM (diabetes mellitus), type 2, uncontrolled with complications ([place]) 29/92/4268  . CVA, old, hemiparesis ([place]) 03/25/2011  . Hypertension 03/25/2011    Orientation RESPIRATION BLADDER Height & Weight     Self, Time, Situation, Place  O2 (2 Liters oxygen. Bipap ordered at hospital -0 breaths/min-set rate; resp. rate-17 breaths/min, O2%=0%, Flow rate= 2 lpm) Continent Weight: 275 lb 9.2 oz (125 kg) Height:  5\' 6"  (167.6 cm)  BEHAVIORAL SYMPTOMS/MOOD NEUROLOGICAL BOWEL NUTRITION STATUS    Convulsions/Seizures (History of seizures) Incontinent Diet (Carb modified)  AMBULATORY STATUS COMMUNICATION OF NEEDS Skin   Total Care (Patient unable to ambulate) Verbally Other (Comment), Skin abrasions (Skin tear-right buttocks; Blister-right hip; Cellulitis-bilateral hips and legs; Ecchymosis-bilateral arms)                       Personal Care Assistance Level of Assistance  Bathing, Feeding, Dressing (Pt total care for ADL at bed level and total assist for hoyer lift transfers) Bathing Assistance: Maximum assistance Feeding assistance: Limited assistance (Assistance with set-up) Dressing Assistance: Maximum assistance  Functional Limitations Info  Sight, Hearing, Speech Sight Info: Impaired Hearing Info: Adequate Speech Info: Adequate    SPECIAL CARE FACTORS FREQUENCY  PT (By licensed PT), OT (By  licensed OT)     PT Frequency: Evaluated 5/19 and a minimum of 2X per week therapy recommended OT Frequency: Evaluated 5/19 and OT follow-up recommended            Contractures Contractures Info: Not present    Additional Factors Info  Code Status, Allergies, Insulin Sliding Scale Code Status Info: Full Allergies Info: Penicillins, Erythromycin, Zithromax, Azithromycin   Insulin Sliding Scale Info: 0-9 Units 3X per day with meals       Current Medications (09/21/2016):  This is the current hospital active medication list Current Facility-Administered Medications  Medication Dose Route Frequency Provider Last Rate Last Dose  . acetaminophen (TYLENOL) tablet 650 mg  650 mg Oral Q6H PRN Rondel Jumbo, PA-C       Or  . acetaminophen (TYLENOL) suppository 650 mg  650 mg Rectal Q6H PRN Rondel Jumbo, PA-C      . albuterol (PROVENTIL) (2.5 MG/3ML) 0.083% nebulizer solution 2.5 mg  2.5 mg Nebulization Q6H PRN Rondel Jumbo, PA-C      . apixaban (ELIQUIS) tablet 5 mg  5 mg Oral BID Sharene Butters E, PA-C   5 mg at 09/21/16 1048  . bisacodyl (DULCOLAX) suppository 10 mg  10 mg Rectal Daily PRN Rondel Jumbo, PA-C      . calcium-vitamin D (OSCAL WITH D) 500-200 MG-UNIT per tablet 1 tablet  1 tablet Oral BID Rondel Jumbo, PA-C   1 tablet at 09/21/16 1048  . dextromethorphan-guaiFENesin (MUCINEX DM) 30-600 MG per 12 hr tablet 1 tablet  1 tablet Oral BID PRN Rondel Jumbo, PA-C      . diclofenac sodium (VOLTAREN) 1 % transdermal gel 2 g  2 g Topical QID PRN Rondel Jumbo, PA-C      . docusate sodium (COLACE) capsule 100 mg  100 mg Oral Daily PRN Rondel Jumbo, PA-C      . ferrous sulfate tablet 325 mg  325 mg Oral Q supper Rondel Jumbo, PA-C   325 mg at 09/20/16 1753  . furosemide (LASIX) tablet 60 mg  60 mg Oral Daily Thurnell Lose, MD   60 mg at 09/21/16 1048  . hydrOXYzine (ATARAX/VISTARIL) tablet 25 mg  25 mg Oral Q8H PRN Sharene Butters E, PA-C      . insulin aspart  (novoLOG) injection 0-9 Units  0-9 Units Subcutaneous TID WC Rondel Jumbo, PA-C   3 Units at 09/21/16 0818  . insulin glargine (LANTUS) injection 40 Units  40 Units Subcutaneous QHS Lala Lund K, MD      . ipratropium-albuterol (DUONEB) 0.5-2.5 (3) MG/3ML nebulizer solution 3 mL  3 mL Nebulization Q6H PRN Rondel Jumbo, PA-C      . levETIRAcetam (KEPPRA) tablet 500 mg  500 mg Oral BID Thurnell Lose, MD      . metoprolol tartrate (LOPRESSOR) tablet 12.5 mg  12.5 mg Oral BID Sharene Butters E, PA-C   12.5 mg at 09/21/16 1048  . ondansetron (ZOFRAN) tablet 4 mg  4 mg Oral Q6H PRN Rondel Jumbo, PA-C       Or  . ondansetron (ZOFRAN) injection 4 mg  4 mg Intravenous Q6H PRN Wertman, Sara E, PA-C      . pantoprazole (PROTONIX) EC tablet 40 mg  40 mg Oral q  morning - 10a Rondel Jumbo, PA-C   40 mg at 09/21/16 1048  . polyethylene glycol (MIRALAX / GLYCOLAX) packet 17 g  17 g Oral Daily PRN Sharene Butters E, PA-C      . pregabalin (LYRICA) capsule 75 mg  75 mg Oral QHS Rondel Jumbo, PA-C   75 mg at 09/20/16 2231  . rOPINIRole (REQUIP) tablet 4 mg  4 mg Oral BID Rondel Jumbo, PA-C   4 mg at 09/20/16 2233  . senna-docusate (Senokot-S) tablet 1 tablet  1 tablet Oral QHS PRN Rondel Jumbo, PA-C      . Vitamin D (Ergocalciferol) (DRISDOL) capsule 50,000 Units  50,000 Units Oral Q7 days Rondel Jumbo, PA-C   50,000 Units at 09/19/16 1230     Discharge Medications: Please see discharge summary for a list of discharge medications.  Relevant Imaging Results:  Relevant Lab Results:   Additional Information ss#[id number]  Sable Feil, LCSW

## 2016-09-21 NOTE — Clinical Social Work Note (Signed)
Clinical Social Work Assessment  Patient Details  Name: Elizabeth Pugh MRN: [id number] Date of Birth: [birth year]  Date of referral:  09/21/16               Reason for consult:  Facility Placement                Permission sought to share information with:  Family Supports Permission granted to share information::  Yes, Verbal Permission Granted  Name::     [name]  Agency::     Relationship::  Daughter  Sport and exercise psychologist Information:  [phone] (mobile) and [phone] (home)  Housing/Transportation Living arrangements for the past 2 months:  [place] of Information:  Patient Patient Interpreter Needed:  None Criminal Activity/Legal Involvement Pertinent to Current Situation/Hospitalization:  No - Comment as needed Significant Relationships:  Adult Children, Siblings, Other Family Members Lives with:  Adult Children (Per patient, her daughter [name] lives with her) Do you feel safe going back to the place where you live?  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 Patient informed CSW that she was going to rehab because her doctor recommended and she wanted to do what he  recommends.   Social Worker assessment / plan:  CSW talked with patient at the bedside initially, then later with patient's daughter, sister at the bedside. When asked, Ms. Bodner reported that she has been to rehab before and provided names of facilities and talked about her experiences. When asked if she had a preference, patient wanted CSW to talk with her daughter.  Mrs. Mackowiak informed CSW that she had 4 children and 3 of them are deceased, along with her husband. Patient talked very lovingly regarding her great-grandchildren and their visits with  her.  Employment status:  Retired Forensic scientist:  Information systems manager, Neurosurgeon) PT Recommendations:  [place] / Referral to community resources:  [place] (SNF list provided to daughter)  Patient/Family's Response to care:  No concerns expressed regarding care during hospitalization.  Patient/Family's Understanding of and Emotional Response to Diagnosis, Current Treatment, and Prognosis: Not discussed.  Emotional Assessment Appearance:  Appears stated age Attitude/Demeanor/Rapport:  Other (Appropriate) Affect (typically observed):  Pleasant, Appropriate Orientation:  Oriented to Self, Oriented to Place, Oriented to  Time, Oriented to Situation Alcohol / Substance use:  Tobacco Use, Alcohol Use, Illicit Drugs (Patient reported that she quit smoking and does not drink or use illicit drugs) Psych involvement (Current and /or in the community):  No (Comment)  Discharge Needs  Concerns to be addressed:  Discharge Planning Concerns Readmission within the last 30 days:  Yes Current discharge risk:  None Barriers to Discharge:  No Barriers Identified   Sable Feil, LCSW 09/21/2016, 4:16 PM

## 2016-09-21 NOTE — Discharge Planning (Signed)
Pt given discharge instructions and follow up info. Denies questions. Report given to RN at [facility]. Pt transported to facility on stretcher.

## 2016-09-21 NOTE — Clinical Social Work Placement (Signed)
   CLINICAL SOCIAL WORK PLACEMENT  NOTE 09/21/16 - DISCHARGED TO [facility] VIA AMBULANCE  Date:  09/21/2016  Patient Details  Name: Elizabeth Pugh MRN: [id number] Date of Birth: 07-28-1926  Clinical Social Work is seeking post-discharge placement for this patient at the [place] level of care (*CSW will initial, date and re-position this form in  chart as items are completed):  Yes   Patient/family provided with [place] Work Department's list of facilities offering this level of care within the geographic area requested by the patient (or if unable, by the patient's family).  Yes   Patient/family informed of their freedom to choose among providers that offer the needed level of care, that participate in Medicare, Medicaid or managed care program needed by the patient, have an available bed and are willing to accept the patient.  Yes   Patient/family informed of [facility]'s ownership interest in [facility] and [facility], as well as of the fact that they are under no obligation to receive care at these facilities.  PASRR submitted to EDS on       PASRR number received on       Existing PASRR number confirmed on 09/21/16     FL2 transmitted to all facilities in geographic area requested by pt/family on 09/21/16     FL2 transmitted to all facilities within larger geographic area on       Patient informed that his/her managed care company has contracts with or will negotiate with certain facilities, including the following:        Yes   Patient/family informed of bed offers received.  Patient chooses bed at [facility]     Physician recommends and patient chooses bed at      Patient to be transferred to [facility] on 09/21/16.  Patient to be transferred to facility by Ambulance     Patient family notified on 09/14/16 of transfer.  Name of family member notified:  Daughter [name] at  the bedside     PHYSICIAN       Additional Comment:    _______________________________________________ Sable Feil, LCSW 09/21/2016, 4:23 PM

## 2016-09-21 NOTE — Progress Notes (Signed)
@IPLOG         PROGRESS NOTE                                                                                                                                                                                                             Patient Demographics:    Elizabeth Pugh, is a 83 y.o. female, DOB - 1926/08/17, LGX:[id number]  Admit date - 09/18/2016   Admitting Physician Lady Deutscher, MD  Outpatient Primary MD for the patient is Nolene Ebbs, MD  LOS - 3  Chief Complaint  Patient presents with  . Altered Mental Status       Brief Narrative   Elizabeth Pugh is a 83 y.o. female with medical history significant for morbid obesity, history of CVA  Now bedbound, chronic syst and diast ChF EF 45%, COPD on  4l Leon 45 % and Bipap at night, CAF on Eliquis, GERD, HTN, depression, anxiety, h/o seizures.  Who has had multiple admissions, this is the seventh admission in the last 6 months, she seems to have recurrent issues with mild cellulitis and UTI. She usually responds to antibiotics in 1-2 days and then goes home. Clearly she will be better placed at an SNF where small infections can be treated and hospital admissions can be prevented.  Usually turns around within 24-48 hours and then discharges home as in the past finance has been a hindrance on SNF placement. He was admitted again for a mild episode of UTI within 12 hours she is close to her baseline.   Subjective:   Patient in bed, appears comfortable, denies any headache, no fever, no chest pain or pressure, no shortness of breath , no abdominal pain. No focal weakness.   Assessment  & Plan :     1.Acute toxic encephalopathy due to UTI. History of multiple admissions in the last 6 months for mild UTI or cellulitis. She usually responds to antibiotics within 24-48 hours and then is stable for discharge.  Again she was placed on empiric IV antibiotics for UTI, has responded very well and close to baseline this morning, head CT  was unremarkable, she has no focal deficits, So far cultures negative and will stop antibiotics on 09/21/2016. We'll have PT and social work evaluation for SNF placement as clearly she requires SNF to avoid future hospitalizations, she usually gets minor infections which require a few days of antibiotics and then she is back to her baseline, if she is placed to SNF hospital admissions can easily be avoided in the  future. In the past finance has been an issue we'll see if we can provide a letter of guarantee. Patient herself is agreeable to placement.   2. Chronic combined systolic and diastolic CHF EF 68%. She is now close to being compensated, she usually has 3+ bilateral leg edema and baseline which is down to trace, current weight is 275 pounds, Lasix dose has been tapered due to ARF which is improving. Continue monitoring closely.  3. GERD. On PPI.  4. Morbid obesity with obstructive sleep apnea. He again remains noncompliant with CPAP which is consistent with her previous admissions, continue supportive care follow with PCP for weight loss.   5. History of stroke and chronic deconditioning and weakness. She is now bedbound and diffusely deconditioned, PT eval, may require SNF placement which I think will be most beneficial for her, currently on Eliquis will monitor Eliquis does and her renal function.   6. Chronic A. fib with PE, [place] vasc 2 score 4. On Lopressor and Eliquis.    7. ARF. Due to rapid diuresis improved after cutting down Lasix, will discharge on the present dose.  8. DM type II. On Lantus and sliding scale monitor.  CBG (last 3)   Recent Labs  09/20/16 1737 09/20/16 2219 09/21/16 0753  GLUCAP 301* 276* 229*      Diet : Diet Carb Modified Fluid consistency: Thin; Room service appropriate? Yes; Fluid restriction: 1500 mL Fluid    Family Communication  :  None  Code Status :  Full  Disposition Plan  :  SNF preferred  Consults  :  None  Procedures  :    CT  head -ve  DVT Prophylaxis  :  Eliquis  Lab Results  Component Value Date   PLT 129 (L) 09/19/2016    Inpatient Medications  Scheduled Meds: . apixaban  5 mg Oral BID  . calcium-vitamin D  1 tablet Oral BID  . ferrous sulfate  325 mg Oral Q supper  . furosemide  60 mg Oral Daily  . insulin aspart  0-9 Units Subcutaneous TID WC  . insulin glargine  25 Units Subcutaneous QHS  . metoprolol tartrate  12.5 mg Oral BID  . pantoprazole  40 mg Oral q morning - 10a  . pregabalin  75 mg Oral QHS  . rOPINIRole  4 mg Oral BID  . Vitamin D (Ergocalciferol)  50,000 Units Oral Q7 days   Continuous Infusions: . ceFEPime (MAXIPIME) IV 1 g (09/21/16 1049)   PRN Meds:.acetaminophen **OR** acetaminophen, albuterol, bisacodyl, dextromethorphan-guaiFENesin, diclofenac sodium, docusate sodium, hydrOXYzine, ipratropium-albuterol, ondansetron **OR** ondansetron (ZOFRAN) IV, polyethylene glycol, senna-docusate  Antibiotics  :    Anti-infectives    Start     Dose/Rate Route Frequency Ordered Stop   09/20/16 1100  vancomycin (VANCOCIN) 1,500 mg in sodium chloride 0.9 % 500 mL IVPB  Status:  Discontinued     1,500 mg 250 mL/hr over 120 Minutes Intravenous Every 48 hours 09/18/16 1151 09/19/16 1034   09/19/16 1100  ceFEPIme (MAXIPIME) 1 g in dextrose 5 % 50 mL IVPB     1 g 100 mL/hr over 30 Minutes Intravenous Every 24 hours 09/18/16 1151     09/18/16 1030  ceFEPIme (MAXIPIME) 2 g in dextrose 5 % 50 mL IVPB     2 g 100 mL/hr over 30 Minutes Intravenous  Once 09/18/16 1022 09/18/16 1115   09/18/16 1030  vancomycin (VANCOCIN) 2,000 mg in sodium chloride 0.9 % 500 mL IVPB     2,000  mg 250 mL/hr over 120 Minutes Intravenous  Once 09/18/16 1022 09/18/16 1417         Objective:   Vitals:   09/20/16 2335 09/21/16 0647 09/21/16 0911 09/21/16 1048  BP:  116/64 107/75 107/75  Pulse: 86 80 93 93  Resp: 17 16 16    Temp:  97.4 F (36.3 C) 98.4 F (36.9 C)   TempSrc:  Oral Oral   SpO2: 100% 100% 99%    Weight:      Height:        Wt Readings from Last 3 Encounters:  09/20/16 125 kg (275 lb 9.2 oz)  09/07/16 127 kg (280 lb)  08/28/16 121.6 kg (268 lb)     Intake/Output Summary (Last 24 hours) at 09/21/16 1059 Last data filed at 09/21/16 0912  Gross per 24 hour  Intake            36675 ml  Output                2 ml  Net            36673 ml     Physical Exam  Awake Alert,  No new F.N deficits, Normal affect [place].AT,PERRAL Supple Neck,No JVD, No cervical lymphadenopathy appriciated.  Symmetrical Chest wall movement, Good air movement bilaterally, CTAB RRR,No Gallops,Rubs or new Murmurs, No Parasternal Heave +ve B.Sounds, Abd Soft, No tenderness, No organomegaly appriciated, No rebound - guarding or rigidity. No Cyanosis, Clubbing, trace leg edema, No new Rash or bruise    Data Review:    CBC  Recent Labs Lab 09/18/16 1215 09/19/16 0452  WBC 6.8 4.3  HGB 10.0* 9.8*  HCT 33.8* 33.0*  PLT 137* 129*  MCV 96.3 97.3  MCH 28.5 28.9  MCHC 29.6* 29.7*  RDW 15.1 15.0  LYMPHSABS 0.9  --   MONOABS 0.1  --   EOSABS 0.0  --   BASOSABS 0.0  --     Chemistries   Recent Labs Lab 09/18/16 1109 09/19/16 0452 09/20/16 0421 09/21/16 0603  NA 142 141 140 136  K 4.4 3.9 5.0 4.3  CL 101 101 101 97*  CO2 33* 32 30 29  GLUCOSE 222* 226* 261* 239*  BUN 32* 32* 30* 27*  CREATININE 1.60* 1.40* 1.46* 1.21*  CALCIUM 9.1 8.6* 9.0 9.0  AST 21 15  --   --   ALT 12* 10*  --   --   ALKPHOS 48 47  --   --   BILITOT 1.0 0.8  --   --    ------------------------------------------------------------------------------------------------------------------ No results for input(s): CHOL, HDL, LDLCALC, TRIG, CHOLHDL, LDLDIRECT in the last 72 hours.  Lab Results  Component Value Date   HGBA1C 8.7 (H) 08/23/2016   ------------------------------------------------------------------------------------------------------------------ No results for input(s): TSH, T4TOTAL, T3FREE, THYROIDAB  in the last 72 hours.  Invalid input(s): FREET3 ------------------------------------------------------------------------------------------------------------------ No results for input(s): VITAMINB12, FOLATE, FERRITIN, TIBC, IRON, RETICCTPCT in the last 72 hours.  Coagulation profile  Recent Labs Lab 09/19/16 0452  INR 1.61    No results for input(s): DDIMER in the last 72 hours.  Cardiac Enzymes No results for input(s): CKMB, TROPONINI, MYOGLOBIN in the last 168 hours.  Invalid input(s): CK ------------------------------------------------------------------------------------------------------------------    Component Value Date/Time   BNP 187.2 (H) 09/01/2016 0602    Micro Results Recent Results (from the past 240 hour(s))  Culture, blood (routine x 2)     Status: None (Preliminary result)   Collection Time: 09/18/16 10:20 AM  Result Value Ref  Range Status   Specimen Description BLOOD LEFT FOREARM  Final   Special Requests   Final    BOTTLES DRAWN AEROBIC ONLY Blood Culture adequate volume   Culture NO GROWTH 3 DAYS  Final   Report Status PENDING  Incomplete  Urine culture     Status: None   Collection Time: 09/18/16 10:23 AM  Result Value Ref Range Status   Specimen Description URINE, CATHETERIZED  Final   Special Requests NONE  Final   Culture NO GROWTH  Final   Report Status 09/19/2016 FINAL  Final  Culture, blood (routine x 2)     Status: None (Preliminary result)   Collection Time: 09/18/16 10:30 AM  Result Value Ref Range Status   Specimen Description BLOOD RIGHT FOREARM  Final   Special Requests   Final    BOTTLES DRAWN AEROBIC ONLY Blood Culture adequate volume   Culture NO GROWTH 3 DAYS  Final   Report Status PENDING  Incomplete    Radiology Reports X-ray Chest Pa And Lateral  Result Date: 09/19/2016 CLINICAL DATA:  CHF, productive cough EXAM: CHEST  2 VIEW COMPARISON:  09/18/2016 FINDINGS: Cardiomegaly with mild vascular congestion. No overt edema or  confluent opacities. No effusions. No acute bony abnormality. IMPRESSION: Cardiomegaly, vascular congestion. Electronically Signed   By: Rolm Baptise M.D.   On: 09/19/2016 08:40   Dg Tibia/fibula Left  Result Date: 09/18/2016 CLINICAL DATA:  Cellulitis. EXAM: LEFT TIBIA AND FIBULA - 2 VIEW COMPARISON:  Femur films from the same day. Knee radiographs 08/25/2006. FINDINGS: Marked osteopenia is present. Progressive degenerative changes are noted in the knee, particularly in the medial compartment. Posterolateral cortical thickening of proximal tibia is chronic. No focal erosive changes are present. Extensive soft tissue swelling is noted lateral to the knee. IMPRESSION: 1. Extensive soft tissue swelling laterally without acute osseous abnormality. 2. Advanced degenerative changes of the knee have progressed. 3. Marked osteopenia. Electronically Signed   By: San Morelle M.D.   On: 09/18/2016 15:19   Ct Head Wo Contrast  Result Date: 09/18/2016 CLINICAL DATA:  Fever and altered mental status EXAM: CT HEAD WITHOUT CONTRAST TECHNIQUE: Contiguous axial images were obtained from the base of the skull through the vertex without intravenous contrast. COMPARISON:  June 16, 2016 FINDINGS: Brain: The ventricles are normal in size and configuration for age. There is mild sulcal atrophy and moderate cerebellar atrophy, stable. There is no intracranial mass, hemorrhage, extra-axial fluid collection, or midline shift. There is small vessel disease in the basilar perforator distribution of the pons. There is slight small vessel disease in the centra semiovale bilaterally. There are prior lacunar type infarcts in the lateral left basal ganglia region, primarily in the extreme capsule region on the left. There is no acute appearing infarct evident. Vascular: There is no demonstrable hyperdense vessel. There is calcification in each carotid siphon region. There is also slight calcification in the right middle  cerebral artery. Skull: The bony calvarium appears intact. Sinuses/Orbits: There is mucosal thickening in several ethmoid air cells. There is a retention cyst in the inferior right maxillary antrum. Other paranasal sinuses are clear. Orbits appear symmetric bilaterally. Other: There is opacification in several inferior mastoid air cells bilaterally, somewhat more on the right than on the left. IMPRESSION: Stable areas of atrophy with slight supratentorial infratentorial small vessel disease. Prior lacunar type infarcts noted in the left extreme capsule region. No intracranial mass, hemorrhage, or acute appearing infarct. There are areas of arteriovascular calcification bilaterally. There is mastoid  disease bilaterally inferiorly as well as areas of paranasal sinus disease. Electronically Signed   By: Lowella Grip III M.D.   On: 09/18/2016 16:29   Dg Chest Port 1 View  Result Date: 09/18/2016 CLINICAL DATA:  Sepsis EXAM: PORTABLE CHEST 1 VIEW COMPARISON:  08/31/2016 FINDINGS: Cardiomegaly evident with vascular congestion. Background COPD/ emphysema suspected. No superimposed edema, pneumonia, collapse or consolidation. No large effusion or pneumothorax. Trachea is midline. Aorta is atherosclerotic. Degenerative changes of the spine and shoulders. Bones are osteopenic. IMPRESSION: Cardiomegaly with vascular congestion No superimposed acute process. Electronically Signed   By: Jerilynn Mages.  Shick M.D.   On: 09/18/2016 11:25   Dg Chest Port 1 View  Result Date: 08/31/2016 CLINICAL DATA:  Altered mental status.  Fever and dyspnea. EXAM: PORTABLE CHEST 1 VIEW COMPARISON:  08/11/2016 FINDINGS: Unchanged moderate cardiomegaly. Mild vascular prominence, stable and likely chronic. Mild pleural thickening in the lateral and apical regions bilaterally, chronic. No confluent airspace consolidation. IMPRESSION: Stable cardiomegaly. Stable mild vascular prominence. No consolidation or large effusion. Electronically Signed   By:  Andreas Newport M.D.   On: 08/31/2016 23:58   Dg Femur Min 2 Views Left  Result Date: 09/18/2016 CLINICAL DATA:  Cellulitis. EXAM: LEFT FEMUR 2 VIEWS COMPARISON:  None. FINDINGS: Nonspecific reticulation of the leg without evidence of gas or opaque foreign body. Marked osteopenia. Advanced knee osteoarthritis with bone-on-bone contact. IMPRESSION: 1. History of cellulitis. No soft tissue emphysema or opaque foreign body. No acute osseous finding. 2. Osteopenia and advanced knee osteoarthritis. Electronically Signed   By: Monte Fantasia M.D.   On: 09/18/2016 15:08    Time Spent in minutes  30   Lala Lund M.D on 09/21/2016 at 10:59 AM  Between 7am to 7pm - Pager - [phone] ( page via [email or web address], text pages only, please mention full 10 digit call back number). After 7pm go to [email or web address] - password [email or web address]

## 2016-09-21 NOTE — Discharge Summary (Signed)
Elizabeth Pugh:[id number] DOB: 03-03-1927 DOA: 09/18/2016  PCP: Nolene Ebbs, MD  Admit date: 09/18/2016  Discharge date: 09/21/2016  Admitted From: Home   Disposition:  SNF   Recommendations for Outpatient Follow-up:   Follow up with PCP in 1-2 weeks  PCP Please obtain BMP/CBC, 2 view CXR in 1week,  (see Discharge instructions)   PCP Please follow up on the following pending results: Monitor BMP closely,   Home Health: None  Equipment/Devices: None  Consultations: None Discharge Condition: Stable  CODE STATUS: Full   Diet Recommendation: Diet Carb Modified - Heart Healthy Fluid restriction: 1500 mL/ day   Chief Complaint  Patient presents with  . Altered Mental Status     Brief history of present illness from the day of admission and additional interim summary    Elizabeth P Hauseris a 83 y.o.femalewith medical history significant for morbid obesity, history of CVA Now bedbound, chronic syst and diast ChF EF 45%, COPD on 4l [place] 45 % and Bipap at night, CAF on Eliquis, GERD, HTN, depression, anxiety, h/o seizures.  Who has had multiple admissions, this is the seventh admission in the last 6 months, she seems to have recurrent issues with mild cellulitis and UTI. She usually responds to antibiotics in 1-2 days and then goes home. Clearly she will be better placed at an SNF where small infections can be treated and hospital admissions can be prevented.  Usually turns around within 24-48 hours and then discharges home as in the past finance has been a hindrance on SNF placement. He was admitted again for a mild episode of UTI within 12 hours she is close to her baseline.                                                                  Hospital Course   1.Acute toxic encephalopathy due to UTI.  History of multiple admissions in the last 6 months for mild UTI or cellulitis. She usually responds to antibiotics within 24-48 hours and then is stable for discharge.  Again she was placed on empiric IV antibiotics for UTI, has responded very well and close to baseline this morning, head CT was unremarkable, she has no focal deficits, So far cultures negative and will stop antibiotics on 09/21/2016. We'll have PT and social work evaluation for SNF placement as clearly she requires SNF to avoid future hospitalizations, she usually gets minor infections which require a few days of antibiotics and then she is back to her baseline, if she is placed to SNF hospital admissions can easily be avoided in the future. In the past finance has been an issue we'll see if we can provide a letter of guarantee. Patient herself is agreeable to placement.   2. Chronic combined systolic and diastolic CHF EF 24%. She  is now close to being compensated, she usually has 3+ bilateral leg edema and baseline which is down to trace, current weight is 275 pounds, Lasix dose has been tapered due to ARF which is improving. Continue monitoring closely.  3. GERD. On PPI.  4. Morbid obesity with obstructive sleep apnea. He again remains noncompliant with CPAP which is consistent with her previous admissions, continue supportive care follow with PCP for weight loss.   5. History of stroke and chronic deconditioning and weakness. She is now bedbound and diffusely deconditioned, PT eval, may require SNF placement which I think will be most beneficial for her, currently on Eliquis will monitor Eliquis does and her renal function.   6. Chronic A. fib with PE, [place] vasc 2 score 4. On Lopressor and Eliquis.    7. ARF. Due to rapid diuresis improved after cutting down Lasix, continue present dose and monitor BMP closely and.  8. DM type II. On Lantus and sliding scale monitor CBGs QAC-HS      Discharge diagnosis      Principal Problem:   Acute encephalopathy Active Problems:   Chronic atrial fibrillation (HCC)   Chronic combined systolic and diastolic heart failure (HCC)   Urinary tract infection without hematuria    Discharge instructions    Discharge Instructions    Discharge instructions    Complete by:  As directed    Follow with Primary MD Nolene Ebbs, MD in 7 days   Get CBC, CMP, 2 view Chest X ray checked  by Primary MD or SNF MD in 5-7 days ( we routinely change or add medications that can affect your baseline labs and fluid status, therefore we recommend that you get the mentioned basic workup next visit with your PCP, your PCP may decide not to get them or add new tests based on their clinical decision)  Activity: As tolerated with Full fall precautions use walker/cane & assistance as needed  Disposition Home    Diet:   Diet Carb Modified -   Fluid restriction: 1500 mL Fluid/day    Accuchecks 4 times/day, Once in AM empty stomach and then before each meal. Log in all results and show them to your Prim.MD in 3 days. If any glucose reading is under 80 or above 300 call your Prim MD immidiately. Follow Low glucose instructions for glucose under 80 as instructed.  For Heart failure patients - Check your Weight same time everyday, if you gain over 2 pounds, or you develop in leg swelling, experience more shortness of breath or chest pain, call your Primary MD immediately. Follow Cardiac Low Salt Diet and 1.5 lit/day fluid restriction.  On your next visit with your primary care physician please Get Medicines reviewed and adjusted.  Please request your Prim.MD to go over all Hospital Tests and Procedure/Radiological results at the follow up, please get all Hospital records sent to your Prim MD by signing hospital release before you go home.  If you experience worsening of your admission symptoms, develop shortness of breath, life threatening emergency, suicidal or homicidal thoughts  you must seek medical attention immediately by calling 911 or calling your MD immediately  if symptoms less severe.  You Must read complete instructions/literature along with all the possible adverse reactions/side effects for all the Medicines you take and that have been prescribed to you. Take any new Medicines after you have completely understood and accpet all the possible adverse reactions/side effects.   Do not drive, operate heavy machinery, perform activities at  heights, swimming or participation in water activities or provide baby sitting services if your were admitted for syncope or siezures until you have seen by Primary MD or a Neurologist and advised to do so again.  Do not drive when taking Pain medications.    Do not take more than prescribed Pain, Sleep and Anxiety Medications  Special Instructions: If you have smoked or chewed Tobacco  in the last 2 yrs please stop smoking, stop any regular Alcohol  and or any Recreational drug use.  Wear Seat belts while driving.   Please note  You were cared for by a hospitalist during your hospital stay. If you have any questions about your discharge medications or the care you received while you were in the hospital after you are discharged, you can call the unit and asked to speak with the hospitalist on call if the hospitalist that took care of you is not available. Once you are discharged, your primary care physician will handle any further medical issues. Please note that NO REFILLS for any discharge medications will be authorized once you are discharged, as it is imperative that you return to your primary care physician (or establish a relationship with a primary care physician if you do not have one) for your aftercare needs so that they can reassess your need for medications and monitor your lab values.   Increase activity slowly    Complete by:  As directed       Discharge Medications   Allergies as of 09/21/2016      Reactions    Penicillins Rash   Has patient had a PCN reaction causing immediate rash, facial/tongue/throat swelling, SOB or lightheadedness with hypotension: Yes Has patient had a PCN reaction causing severe rash involving mucus membranes or skin necrosis: No Has patient had a PCN reaction that required hospitalization: No Has patient had a PCN reaction occurring within the last 10 years: No If all of the above answers are "NO", then may proceed with Cephalosporin use. *Tolerates cephalosporins   Erythromycin Itching, Other (See Comments)   Vaginal itching   Zithromax [azithromycin] Other (See Comments)   Caused a yeast infection      Medication List    STOP taking these medications   hydrOXYzine 25 MG tablet Commonly known as:  ATARAX/VISTARIL     TAKE these medications   acetaminophen 325 MG tablet Commonly known as:  TYLENOL Take 325-650 mg by mouth every 6 (six) hours as needed (for pain).   albuterol (2.5 MG/3ML) 0.083% nebulizer solution Commonly known as:  PROVENTIL Take 2.5 mg by nebulization every 6 (six) hours as needed for wheezing or shortness of breath.   albuterol 108 (90 Base) MCG/ACT inhaler Commonly known as:  PROVENTIL HFA;VENTOLIN HFA Inhale 1 puff into the lungs every 6 (six) hours as needed for wheezing or shortness of breath.   ALLERGY EYE OP Place 1 drop into both eyes every 8 (eight) hours as needed (For eye irritation.).   apixaban 5 MG Tabs tablet Commonly known as:  ELIQUIS Take 1 tablet (5 mg total) by mouth 2 (two) times daily.   CALCIUM 500+D 500-400 MG-UNIT tablet Generic drug:  calcium-vitamin D Take 1 tablet by mouth 2 (two) times daily.   dextromethorphan-guaiFENesin 30-600 MG 12hr tablet Commonly known as:  MUCINEX DM Take 1 tablet by mouth 2 (two) times daily as needed for cough.   docusate sodium 100 MG capsule Commonly known as:  COLACE Take 100 mg by mouth daily as needed  for mild constipation.   ferrous sulfate 325 (65 FE) MG  tablet Take 325 mg by mouth daily with supper.   furosemide 20 MG tablet Commonly known as:  LASIX Take 3 tablets (60 mg total) by mouth 2 (two) times daily. What changed:  medication strength  how much to take   HUMALOG KWIKPEN 100 UNIT/ML KiwkPen Generic drug:  insulin lispro Inject 2-8 Units into the skin 3 (three) times daily before meals. PER SLIDING SCALE (nothing if BGL is 150 or less): 0-150 = 0 units, 151-200 = 2 units, 201-250 = 4 units, 251-300 = 6 units, 301-350 = 8 units, 351-400 = 10 units, 400+ = 12 units and notify NP/MD   insulin glargine 100 UNIT/ML injection Commonly known as:  LANTUS Inject 0.4 mLs (40 Units total) into the skin at bedtime. What changed:  how much to take   ipratropium-albuterol 0.5-2.5 (3) MG/3ML Soln Commonly known as:  DUONEB Take 3 mLs by nebulization every 6 (six) hours as needed (for wheezing or shortness of breath).   levETIRAcetam 500 MG tablet Commonly known as:  KEPPRA Take 1 tablet (500 mg total) by mouth 2 (two) times daily.   metoprolol tartrate 25 MG tablet Commonly known as:  LOPRESSOR Take 0.5 tablets (12.5 mg total) by mouth 2 (two) times daily.   pantoprazole 40 MG tablet Commonly known as:  PROTONIX Take 1 tablet (40 mg total) by mouth 2 (two) times daily before a meal. What changed:  when to take this   polyethylene glycol packet Commonly known as:  MIRALAX / GLYCOLAX Take 17 g by mouth daily as needed for mild constipation.   potassium chloride SA 20 MEQ tablet Commonly known as:  K-DUR,KLOR-CON Take 1 tablet (20 mEq total) by mouth daily.   pregabalin 75 MG capsule Commonly known as:  LYRICA Take 75 mg by mouth at bedtime.   rOPINIRole 2 MG tablet Commonly known as:  REQUIP Take 1 tablet (2 mg total) by mouth 3 (three) times daily. What changed:  how much to take  when to take this   Vitamin D (Ergocalciferol) 50000 units Caps capsule Commonly known as:  DRISDOL Take 50,000 Units by mouth every 7  (seven) days.   VOLTAREN 1 % Gel Generic drug:  diclofenac sodium Apply 1 application topically 4 (four) times daily as needed (For knee pain.).       Follow-up Information    Nolene Ebbs, MD Follow up in 1 week(s).   Specialty:  Internal Medicine Contact information: [address] [place] [address] [phone]           Major procedures and Radiology Reports - PLEASE review detailed and final reports thoroughly  -       X-ray Chest Pa And Lateral  Result Date: 09/19/2016 CLINICAL DATA:  CHF, productive cough EXAM: CHEST  2 VIEW COMPARISON:  09/18/2016 FINDINGS: Cardiomegaly with mild vascular congestion. No overt edema or confluent opacities. No effusions. No acute bony abnormality. IMPRESSION: Cardiomegaly, vascular congestion. Electronically Signed   By: Rolm Baptise M.D.   On: 09/19/2016 08:40   Dg Tibia/fibula Left  Result Date: 09/18/2016 CLINICAL DATA:  Cellulitis. EXAM: LEFT TIBIA AND FIBULA - 2 VIEW COMPARISON:  Femur films from the same day. Knee radiographs 08/25/2006. FINDINGS: Marked osteopenia is present. Progressive degenerative changes are noted in the knee, particularly in the medial compartment. Posterolateral cortical thickening of proximal tibia is chronic. No focal erosive changes are present. Extensive soft tissue swelling is noted lateral to  the knee. IMPRESSION: 1. Extensive soft tissue swelling laterally without acute osseous abnormality. 2. Advanced degenerative changes of the knee have progressed. 3. Marked osteopenia. Electronically Signed   By: San Morelle M.D.   On: 09/18/2016 15:19   Ct Head Wo Contrast  Result Date: 09/18/2016 CLINICAL DATA:  Fever and altered mental status EXAM: CT HEAD WITHOUT CONTRAST TECHNIQUE: Contiguous axial images were obtained from the base of the skull through the vertex without intravenous contrast. COMPARISON:  June 16, 2016 FINDINGS: Brain: The ventricles are normal in size and configuration for  age. There is mild sulcal atrophy and moderate cerebellar atrophy, stable. There is no intracranial mass, hemorrhage, extra-axial fluid collection, or midline shift. There is small vessel disease in the basilar perforator distribution of the pons. There is slight small vessel disease in the centra semiovale bilaterally. There are prior lacunar type infarcts in the lateral left basal ganglia region, primarily in the extreme capsule region on the left. There is no acute appearing infarct evident. Vascular: There is no demonstrable hyperdense vessel. There is calcification in each carotid siphon region. There is also slight calcification in the right middle cerebral artery. Skull: The bony calvarium appears intact. Sinuses/Orbits: There is mucosal thickening in several ethmoid air cells. There is a retention cyst in the inferior right maxillary antrum. Other paranasal sinuses are clear. Orbits appear symmetric bilaterally. Other: There is opacification in several inferior mastoid air cells bilaterally, somewhat more on the right than on the left. IMPRESSION: Stable areas of atrophy with slight supratentorial infratentorial small vessel disease. Prior lacunar type infarcts noted in the left extreme capsule region. No intracranial mass, hemorrhage, or acute appearing infarct. There are areas of arteriovascular calcification bilaterally. There is mastoid disease bilaterally inferiorly as well as areas of paranasal sinus disease. Electronically Signed   By: Lowella Grip III M.D.   On: 09/18/2016 16:29   Dg Chest Port 1 View  Result Date: 09/18/2016 CLINICAL DATA:  Sepsis EXAM: PORTABLE CHEST 1 VIEW COMPARISON:  08/31/2016 FINDINGS: Cardiomegaly evident with vascular congestion. Background COPD/ emphysema suspected. No superimposed edema, pneumonia, collapse or consolidation. No large effusion or pneumothorax. Trachea is midline. Aorta is atherosclerotic. Degenerative changes of the spine and shoulders. Bones are  osteopenic. IMPRESSION: Cardiomegaly with vascular congestion No superimposed acute process. Electronically Signed   By: Jerilynn Mages.  Shick M.D.   On: 09/18/2016 11:25   Dg Chest Port 1 View  Result Date: 08/31/2016 CLINICAL DATA:  Altered mental status.  Fever and dyspnea. EXAM: PORTABLE CHEST 1 VIEW COMPARISON:  08/11/2016 FINDINGS: Unchanged moderate cardiomegaly. Mild vascular prominence, stable and likely chronic. Mild pleural thickening in the lateral and apical regions bilaterally, chronic. No confluent airspace consolidation. IMPRESSION: Stable cardiomegaly. Stable mild vascular prominence. No consolidation or large effusion. Electronically Signed   By: Andreas Newport M.D.   On: 08/31/2016 23:58   Dg Femur Min 2 Views Left  Result Date: 09/18/2016 CLINICAL DATA:  Cellulitis. EXAM: LEFT FEMUR 2 VIEWS COMPARISON:  None. FINDINGS: Nonspecific reticulation of the leg without evidence of gas or opaque foreign body. Marked osteopenia. Advanced knee osteoarthritis with bone-on-bone contact. IMPRESSION: 1. History of cellulitis. No soft tissue emphysema or opaque foreign body. No acute osseous finding. 2. Osteopenia and advanced knee osteoarthritis. Electronically Signed   By: Monte Fantasia M.D.   On: 09/18/2016 15:08    Micro Results     Recent Results (from the past 240 hour(s))  Culture, blood (routine x 2)     Status: None (Preliminary  result)   Collection Time: 09/18/16 10:20 AM  Result Value Ref Range Status   Specimen Description BLOOD LEFT FOREARM  Final   Special Requests   Final    BOTTLES DRAWN AEROBIC ONLY Blood Culture adequate volume   Culture NO GROWTH 3 DAYS  Final   Report Status PENDING  Incomplete  Urine culture     Status: None   Collection Time: 09/18/16 10:23 AM  Result Value Ref Range Status   Specimen Description URINE, CATHETERIZED  Final   Special Requests NONE  Final   Culture NO GROWTH  Final   Report Status 09/19/2016 FINAL  Final  Culture, blood (routine x 2)      Status: None (Preliminary result)   Collection Time: 09/18/16 10:30 AM  Result Value Ref Range Status   Specimen Description BLOOD RIGHT FOREARM  Final   Special Requests   Final    BOTTLES DRAWN AEROBIC ONLY Blood Culture adequate volume   Culture NO GROWTH 3 DAYS  Final   Report Status PENDING  Incomplete    Today   Subjective    Elizabeth Pugh today has no headache,no chest abdominal pain,no new weakness tingling or numbness, feels much better wants to go home today.     Objective   Blood pressure 107/75, pulse 93, temperature 98.4 F (36.9 C), temperature source Oral, resp. rate 16, height 5\' 6"  (1.676 m), weight 125 kg (275 lb 9.2 oz), SpO2 99 %.   Intake/Output Summary (Last 24 hours) at 09/21/16 1310 Last data filed at 09/21/16 0912  Gross per 24 hour  Intake            36675 ml  Output                2 ml  Net            36673 ml    Exam Awake Alert,  No new F.N deficits, Normal affect .AT,PERRAL Supple Neck,No JVD, No cervical lymphadenopathy appriciated.  Symmetrical Chest wall movement, Good air movement bilaterally, CTAB RRR,No Gallops,Rubs or new Murmurs, No Parasternal Heave +ve B.Sounds, Abd Soft, Non tender, No organomegaly appriciated, No rebound -guarding or rigidity. No Cyanosis, Clubbing , trace leg edema, No new Rash or bruise   Data Review   CBC w Diff: Lab Results  Component Value Date   WBC 4.3 09/19/2016   HGB 9.8 (L) 09/19/2016   HCT 33.0 (L) 09/19/2016   PLT 129 (L) 09/19/2016   LYMPHOPCT 13 09/18/2016   MONOPCT 2 09/18/2016   EOSPCT 0 09/18/2016   BASOPCT 0 09/18/2016    CMP: Lab Results  Component Value Date   NA 136 09/21/2016   NA 141 06/25/2016   K 4.3 09/21/2016   CL 97 (L) 09/21/2016   CO2 29 09/21/2016   BUN 27 (H) 09/21/2016   BUN 15 06/25/2016   CREATININE 1.21 (H) 09/21/2016   GLU 198 06/25/2016   PROT 5.9 (L) 09/19/2016   ALBUMIN 2.7 (L) 09/19/2016   BILITOT 0.8 09/19/2016   ALKPHOS 47 09/19/2016   AST 15  09/19/2016   ALT 10 (L) 09/19/2016  .   Total Time in preparing paper work, data evaluation and todays exam - 36 minutes  Lala Lund M.D on 09/21/2016 at 1:10 PM  [facility]   Office  [phone]

## 2016-09-23 LAB — CULTURE, BLOOD (ROUTINE X 2)
CULTURE: NO GROWTH
Culture: NO GROWTH
SPECIAL REQUESTS: ADEQUATE
Special Requests: ADEQUATE

## 2016-09-25 ENCOUNTER — Telehealth: Payer: Self-pay | Admitting: Pulmonary Disease

## 2016-09-25 NOTE — Telephone Encounter (Signed)
ATC Tammy but she never answered and no way to leave a voice message. Called the daughter as well and left a message.   Pt saw Sarah on 09/17/16. Per the AVS....  Continue wearing BIPAP every night as you have been doing. ABG today or tomorrow to qualify for hypercarbia. Need is due to chronic hypercarbic respiratory failure.. We will change the diagnosis code for need and send to DME. We will send copy of office note to [facility]. Follow up appointment in 2 months with Dr. Elsworth Soho. Please contact office for sooner follow up if symptoms do not improve or worsen or seek emergency care

## 2016-09-29 NOTE — Telephone Encounter (Signed)
Called and lm for Tammy to call back regarding this pt.

## 2016-09-30 NOTE — Telephone Encounter (Signed)
Called and was placed on hold for 5 min  WCB  ABG already done

## 2016-10-01 NOTE — Telephone Encounter (Signed)
Phone messaged placed on 5/18 Leigh left vm to daughter informing her that this was done already. ATC Tammy x3 was placed on hold over 7 min.s  no option to leave a vm

## 2016-10-02 NOTE — Telephone Encounter (Signed)
ATC Tammy, was only hold >46mins then was transferred to the [place] and had to leave a message.  Per office protocol, since we have tried multiple times to get in touch with Tammy regarding pt's ABG will close message.  This message was left on Crystal's VM and informed her that new message will be generated when they return call.  Although, I am unsure if the ABG still needs to be done because [facility] called on 5.18.18 to let [facility] know that pt was hospitalized and an ABG would be performed while pt was hospitalized.  Will close message

## 2016-10-25 ENCOUNTER — Other Ambulatory Visit: Payer: Self-pay | Admitting: Adult Health

## 2016-11-02 ENCOUNTER — Other Ambulatory Visit: Payer: Self-pay | Admitting: Adult Health

## 2016-11-03 ENCOUNTER — Other Ambulatory Visit: Payer: Self-pay | Admitting: Adult Health

## 2016-11-18 ENCOUNTER — Ambulatory Visit: Payer: Medicare Other | Admitting: Acute Care

## 2016-12-02 DEATH — deceased

## 2017-06-14 IMAGING — DX DG TIBIA/FIBULA 2V*L*
4 series · 4 of 4 positions shown · non-contrast
Comparison: Femur films from the same day. Knee radiographs
08/25/2006.

CLINICAL DATA: Cellulitis.

EXAM:
LEFT TIBIA AND FIBULA - 2 VIEW

[tibia ap (1 of 2)]
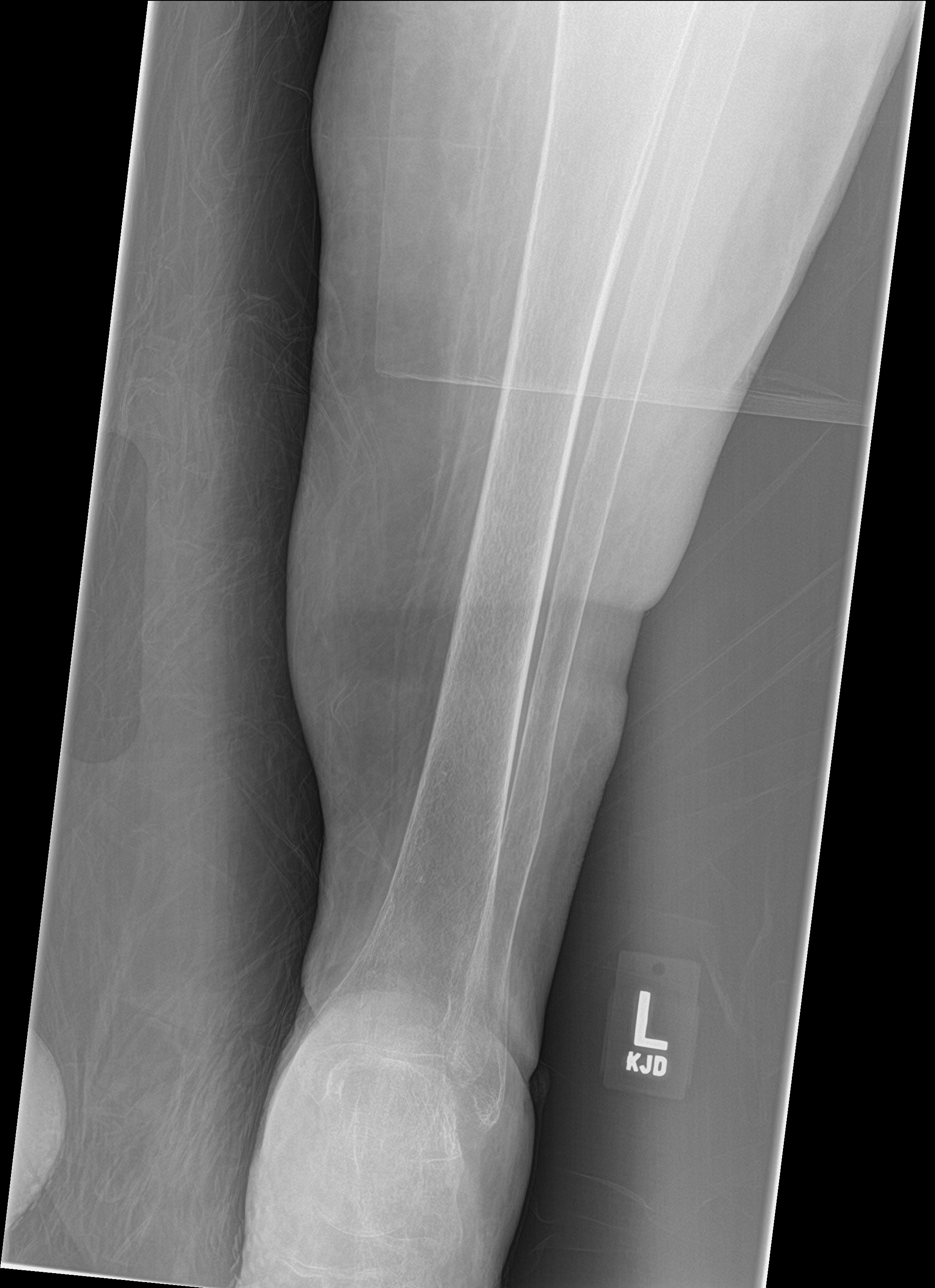

[tibia ap (2 of 2)]
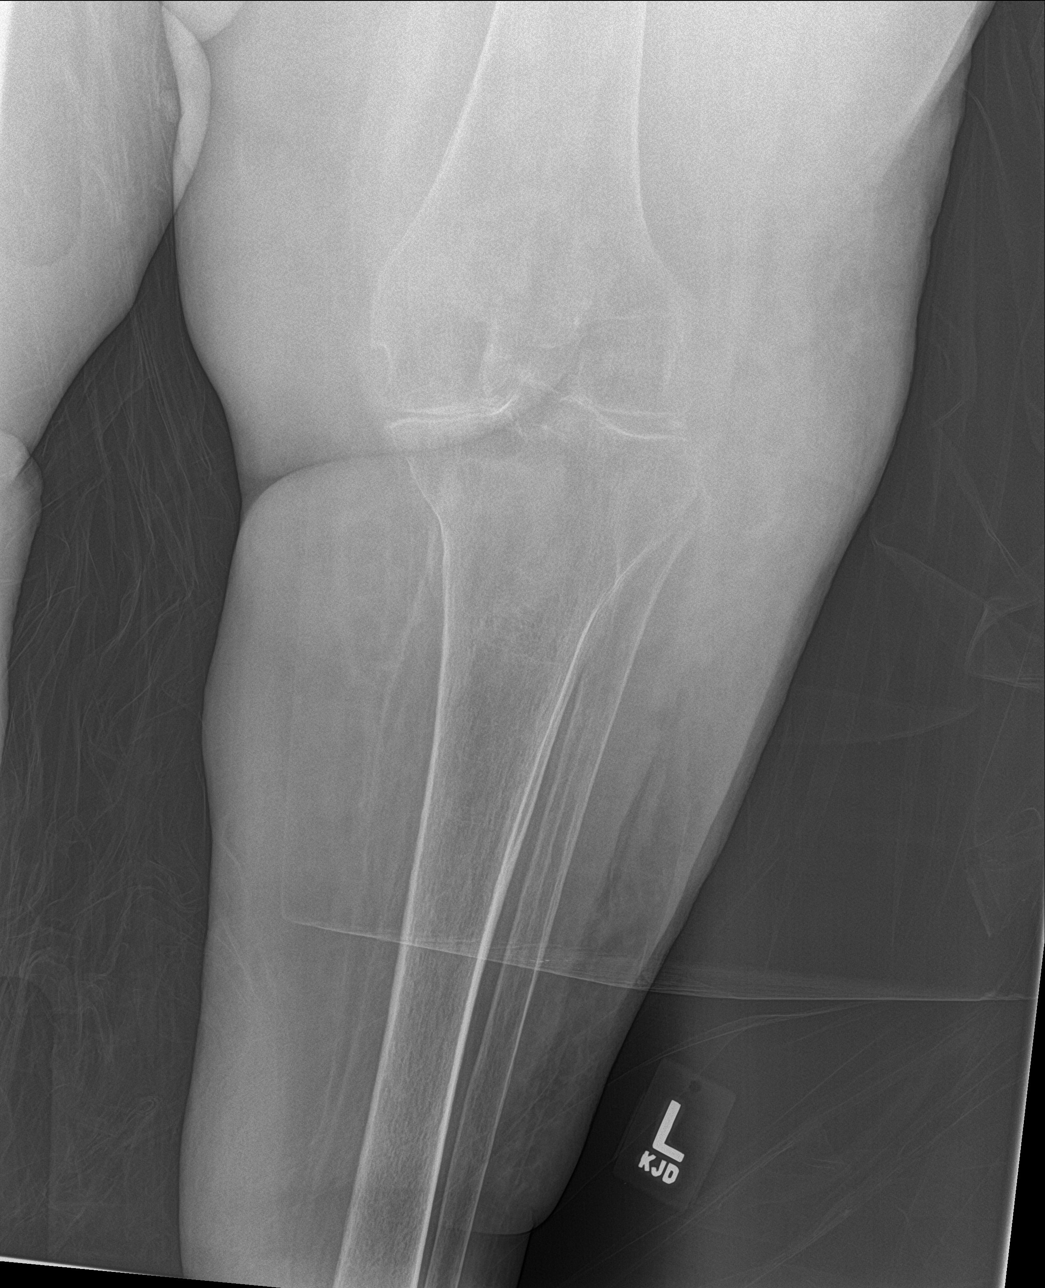

[tibia lat (1 of 2)]
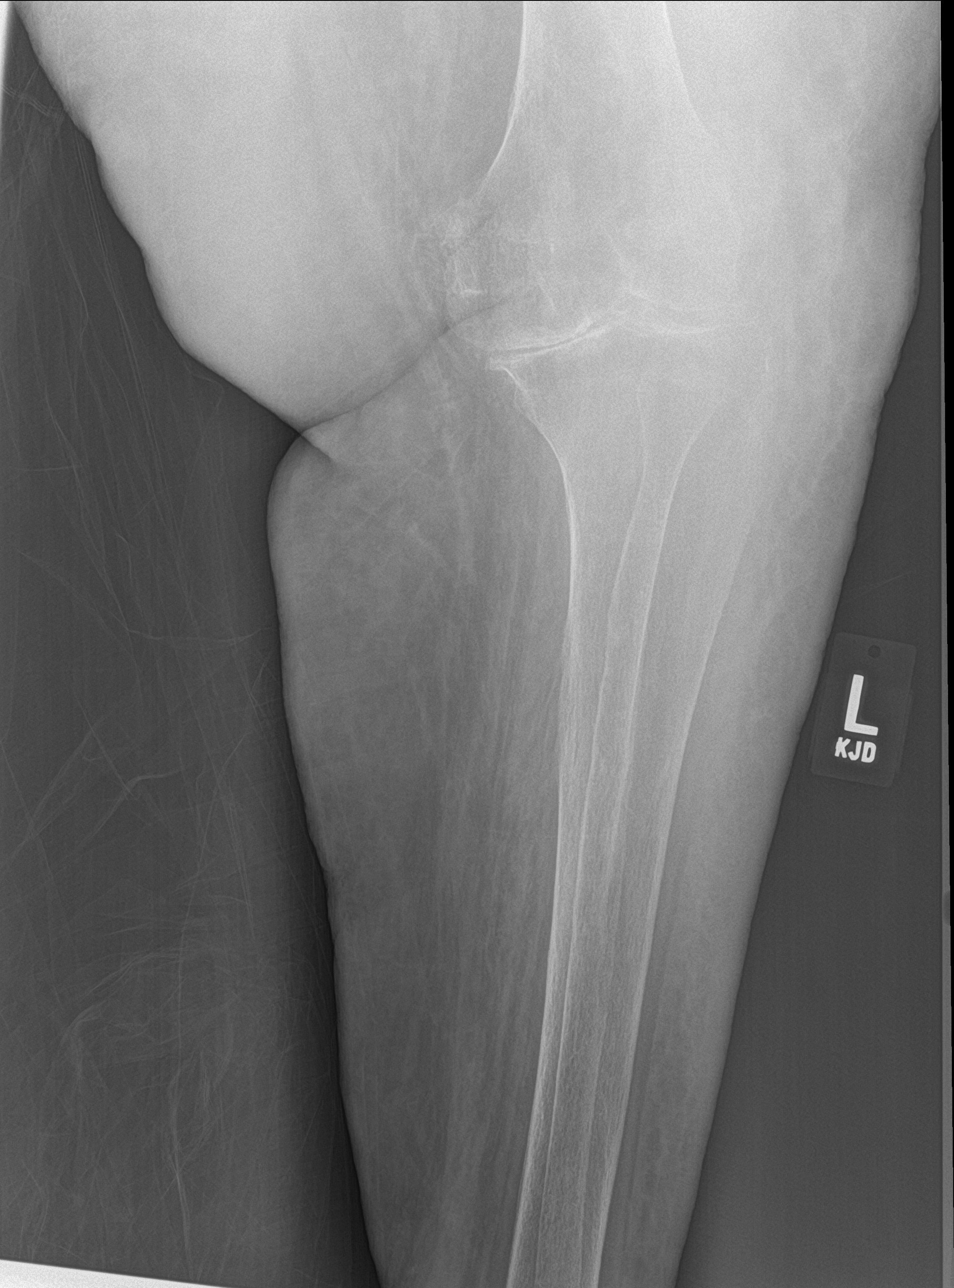

[tibia lat (2 of 2)]
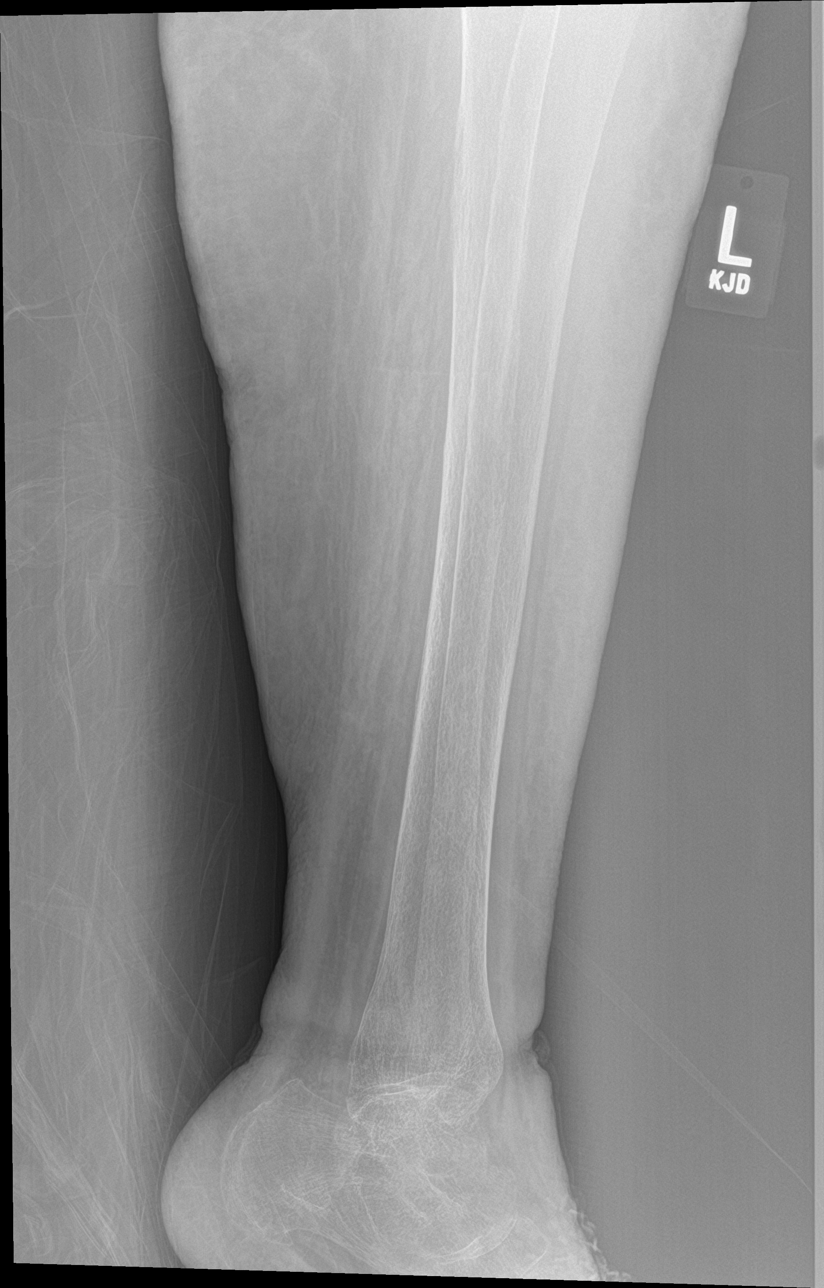

[4 of 4 positions shown; findings below may reference images not displayed]

FINDINGS: Marked osteopenia is present. Progressive degenerative changes are
noted in the knee, particularly in the medial compartment.
Posterolateral cortical thickening of proximal tibia is chronic. No
focal erosive changes are present. Extensive soft tissue swelling is
noted lateral to the knee.
IMPRESSION: 1. Extensive soft tissue swelling laterally without acute osseous
abnormality.
2. Advanced degenerative changes of the knee have progressed.
3. Marked osteopenia.

## 2017-06-15 IMAGING — DX DG CHEST 2V
2 series · 2 of 2 positions shown · non-contrast
Comparison: 09/18/2016

CLINICAL DATA: CHF, productive cough

EXAM:
CHEST  2 VIEW

[chest ap]
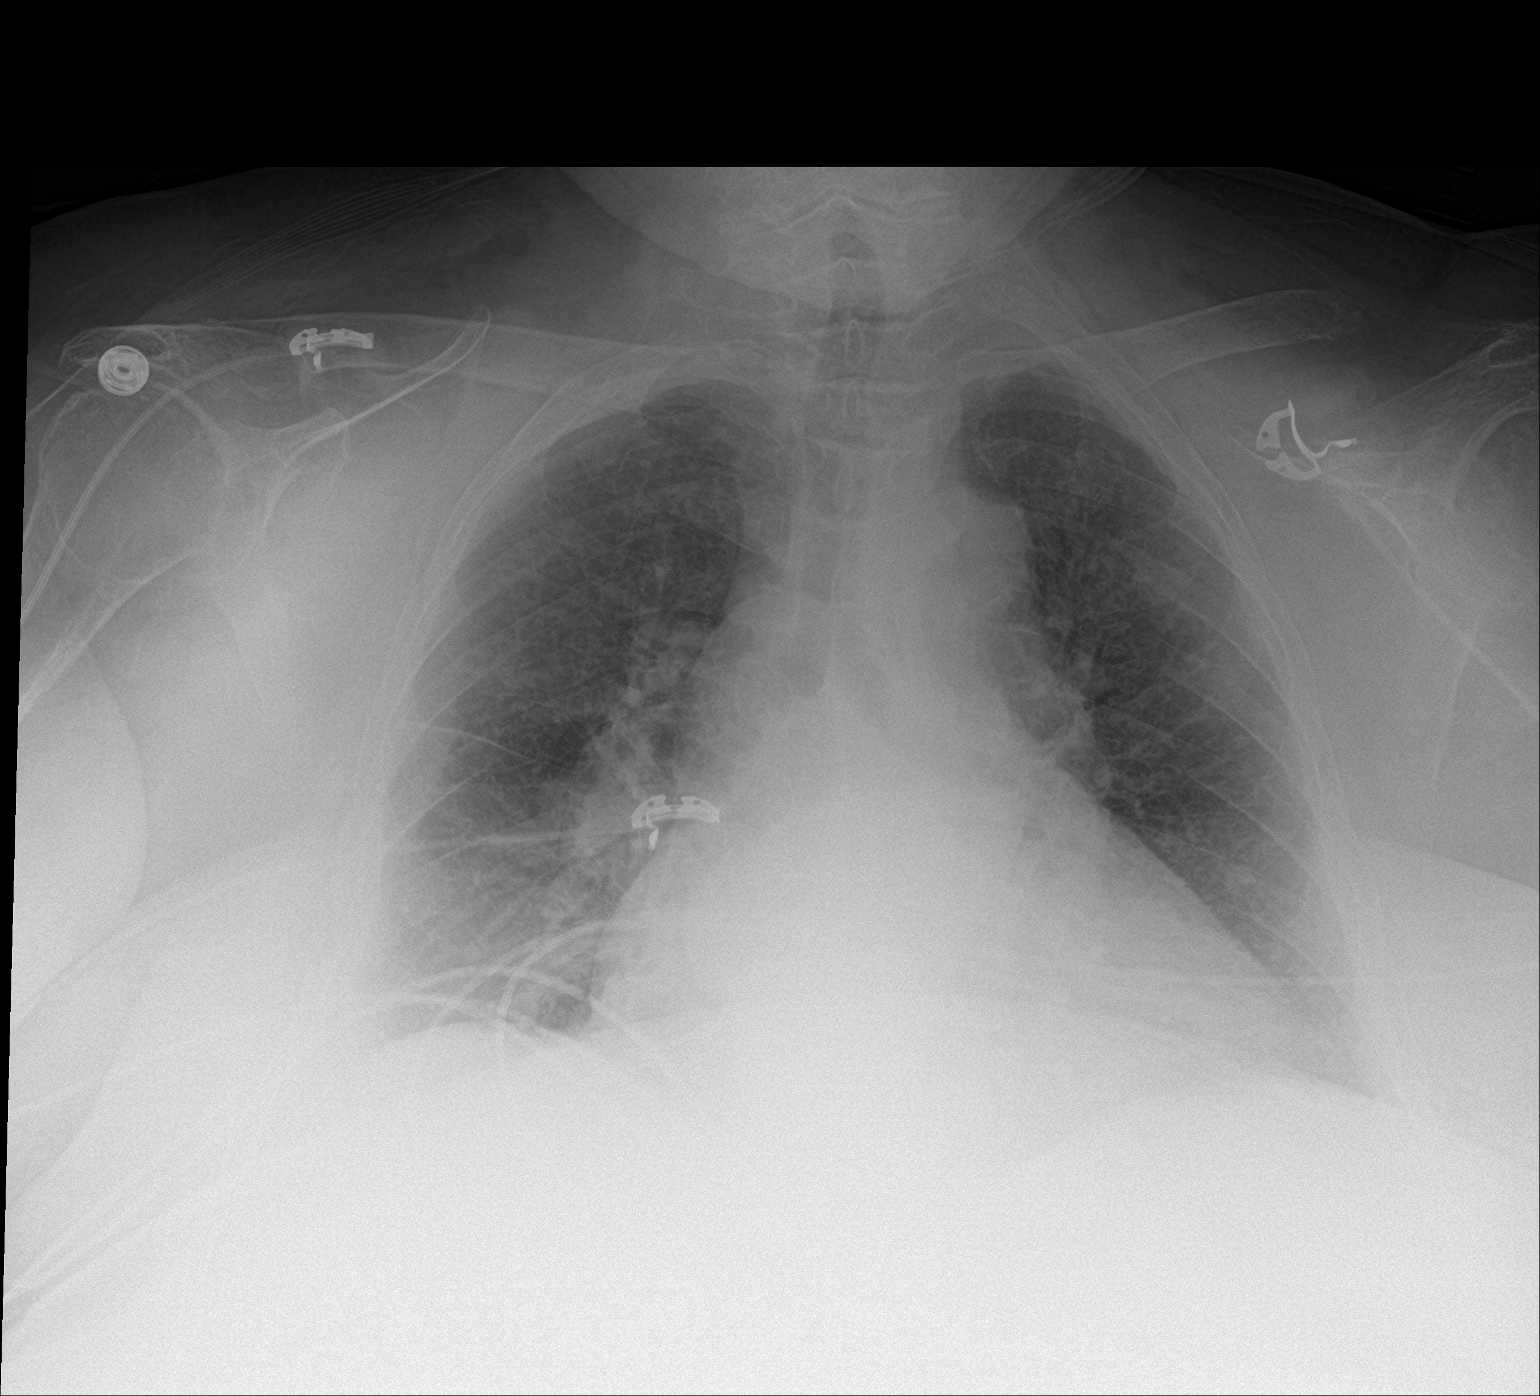

[chest lat]
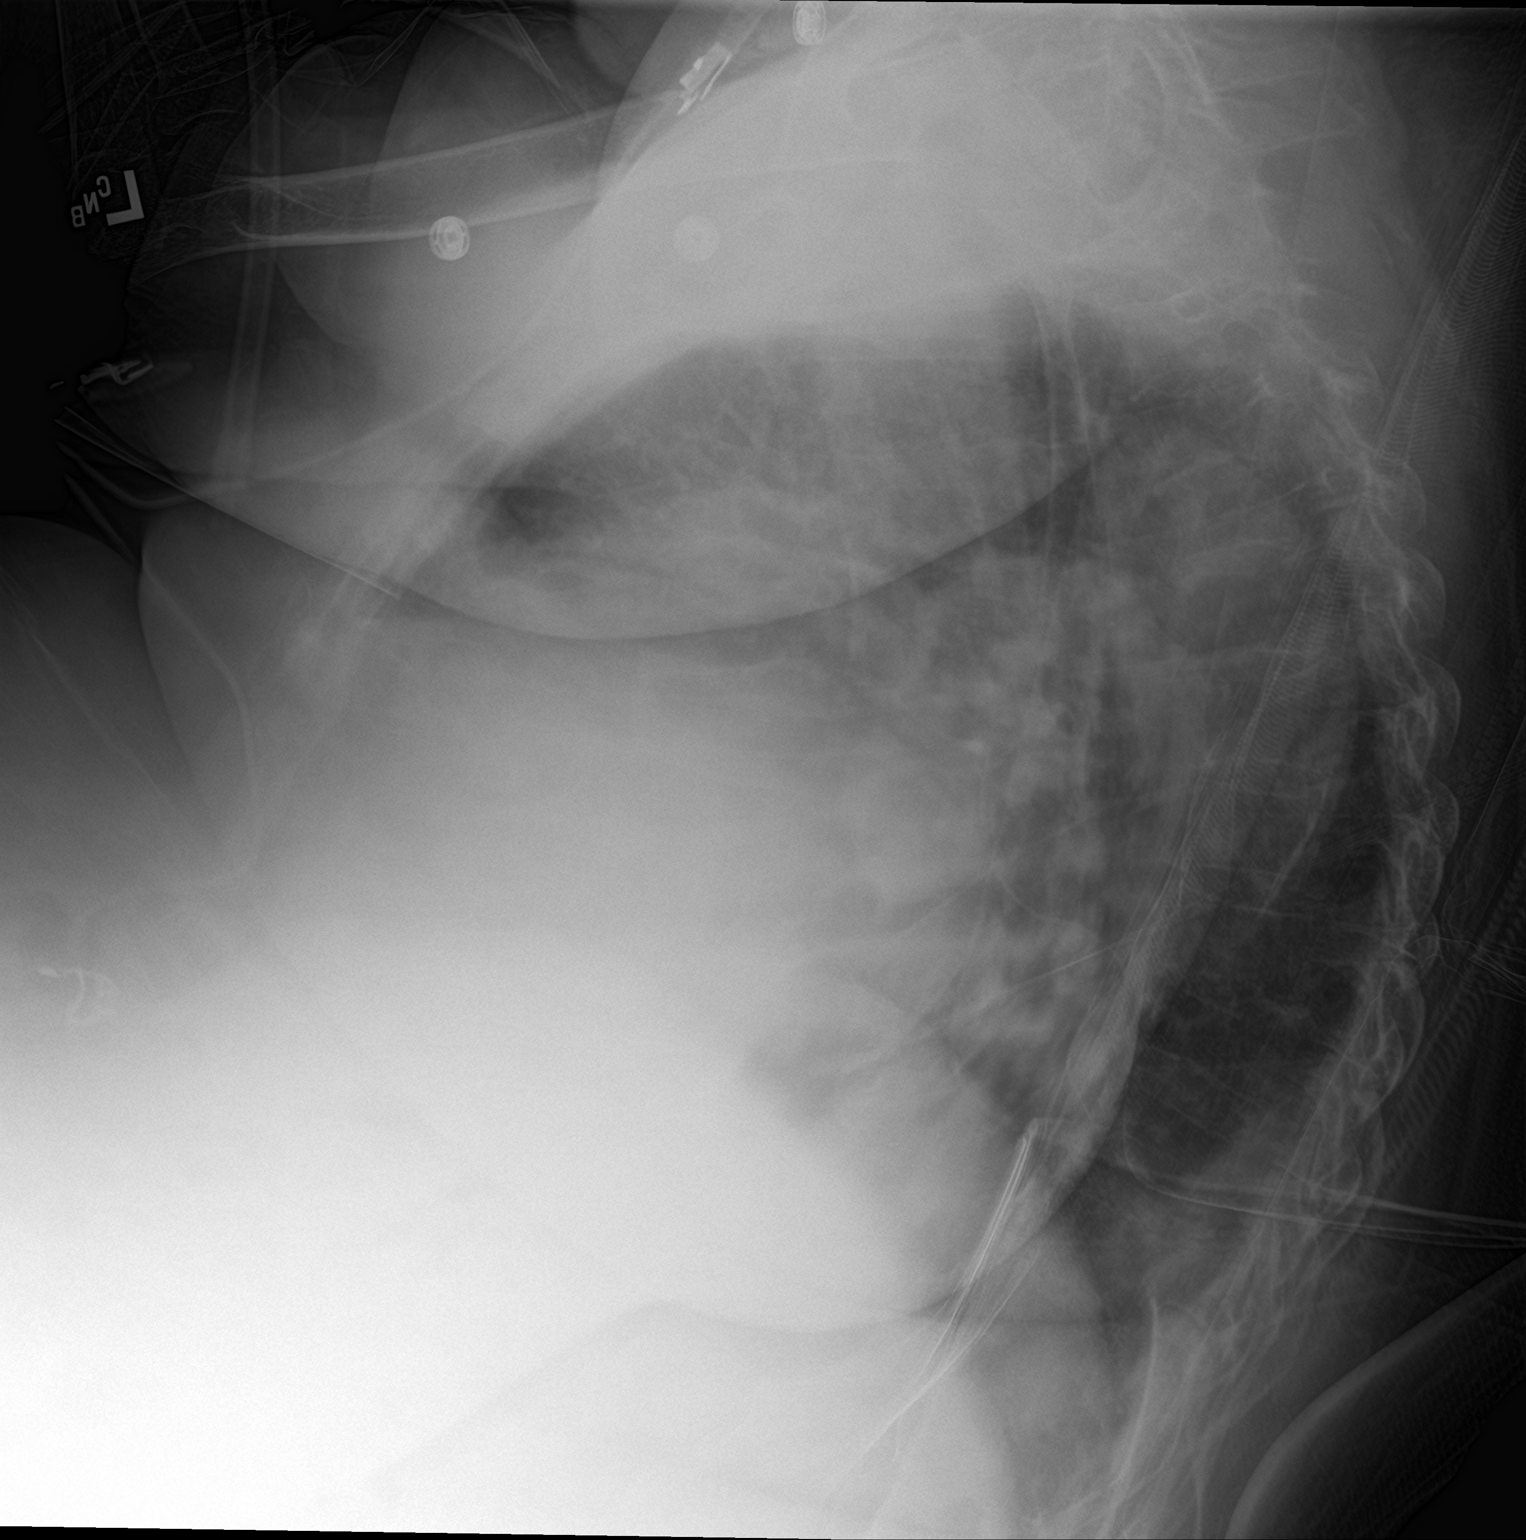

[2 of 2 positions shown; findings below may reference images not displayed]

FINDINGS: Cardiomegaly with mild vascular congestion. No overt edema or
confluent opacities. No effusions. No acute bony abnormality.
IMPRESSION: Cardiomegaly, vascular congestion.
